# Patient Record
Sex: Female | Born: 1950 | Race: White | Hispanic: No | Smoking: Former smoker
Health system: Southern US, Community
[De-identification: ages and names within clinical notes are randomized; demographics above are authoritative.]

## PROBLEM LIST (undated history)

## (undated) DIAGNOSIS — F329 Major depressive disorder, single episode, unspecified: Secondary | ICD-10-CM

## (undated) DIAGNOSIS — E119 Type 2 diabetes mellitus without complications: Secondary | ICD-10-CM

## (undated) DIAGNOSIS — G20C Parkinsonism, unspecified: Secondary | ICD-10-CM

## (undated) DIAGNOSIS — N200 Calculus of kidney: Secondary | ICD-10-CM

## (undated) DIAGNOSIS — Z9981 Dependence on supplemental oxygen: Secondary | ICD-10-CM

## (undated) DIAGNOSIS — J189 Pneumonia, unspecified organism: Secondary | ICD-10-CM

## (undated) DIAGNOSIS — I951 Orthostatic hypotension: Secondary | ICD-10-CM

## (undated) DIAGNOSIS — I10 Essential (primary) hypertension: Secondary | ICD-10-CM

## (undated) DIAGNOSIS — F419 Anxiety disorder, unspecified: Secondary | ICD-10-CM

## (undated) DIAGNOSIS — F32A Depression, unspecified: Secondary | ICD-10-CM

## (undated) DIAGNOSIS — R7303 Prediabetes: Secondary | ICD-10-CM

## (undated) DIAGNOSIS — R519 Headache, unspecified: Secondary | ICD-10-CM

## (undated) DIAGNOSIS — J449 Chronic obstructive pulmonary disease, unspecified: Secondary | ICD-10-CM

## (undated) DIAGNOSIS — E876 Hypokalemia: Secondary | ICD-10-CM

## (undated) DIAGNOSIS — R079 Chest pain, unspecified: Secondary | ICD-10-CM

## (undated) DIAGNOSIS — Z9889 Other specified postprocedural states: Secondary | ICD-10-CM

## (undated) DIAGNOSIS — E162 Hypoglycemia, unspecified: Secondary | ICD-10-CM

## (undated) DIAGNOSIS — I509 Heart failure, unspecified: Secondary | ICD-10-CM

## (undated) HISTORY — DX: Depression, unspecified: F32.A

## (undated) HISTORY — DX: Other specified postprocedural states: Z98.890

## (undated) HISTORY — DX: Anxiety disorder, unspecified: F41.9

## (undated) HISTORY — DX: Hypokalemia: E87.6

## (undated) HISTORY — DX: Chest pain, unspecified: R07.9

## (undated) HISTORY — DX: Essential (primary) hypertension: I10

## (undated) HISTORY — PX: CHOLECYSTECTOMY: SHX55

## (undated) HISTORY — DX: Major depressive disorder, single episode, unspecified: F32.9

## (undated) HISTORY — PX: KIDNEY STONE SURGERY: SHX686

## (undated) HISTORY — DX: Hypoglycemia, unspecified: E16.2

## (undated) HISTORY — DX: Calculus of kidney: N20.0

---

## 2001-07-12 ENCOUNTER — Ambulatory Visit (HOSPITAL_COMMUNITY): Admission: RE | Admit: 2001-07-12 | Discharge: 2001-07-12 | Payer: Self-pay | Admitting: Urology

## 2001-07-17 ENCOUNTER — Ambulatory Visit (HOSPITAL_COMMUNITY): Admission: RE | Admit: 2001-07-17 | Discharge: 2001-07-17 | Payer: Self-pay | Admitting: Urology

## 2001-07-17 ENCOUNTER — Encounter: Payer: Self-pay | Admitting: Urology

## 2001-08-17 ENCOUNTER — Ambulatory Visit (HOSPITAL_COMMUNITY): Admission: RE | Admit: 2001-08-17 | Discharge: 2001-08-17 | Payer: Self-pay | Admitting: Urology

## 2001-08-17 ENCOUNTER — Encounter: Payer: Self-pay | Admitting: Urology

## 2002-02-05 ENCOUNTER — Ambulatory Visit (HOSPITAL_COMMUNITY): Admission: RE | Admit: 2002-02-05 | Discharge: 2002-02-05 | Payer: Self-pay | Admitting: Urology

## 2002-02-05 ENCOUNTER — Encounter: Payer: Self-pay | Admitting: Urology

## 2003-08-21 ENCOUNTER — Ambulatory Visit (HOSPITAL_COMMUNITY): Admission: RE | Admit: 2003-08-21 | Discharge: 2003-08-21 | Payer: Self-pay | Admitting: Internal Medicine

## 2004-05-07 ENCOUNTER — Ambulatory Visit: Payer: Self-pay | Admitting: Family Medicine

## 2004-09-11 ENCOUNTER — Ambulatory Visit: Payer: Self-pay | Admitting: Family Medicine

## 2004-09-16 ENCOUNTER — Ambulatory Visit (HOSPITAL_COMMUNITY): Admission: RE | Admit: 2004-09-16 | Discharge: 2004-09-16 | Payer: Self-pay | Admitting: Family Medicine

## 2004-10-15 ENCOUNTER — Ambulatory Visit: Payer: Self-pay | Admitting: Internal Medicine

## 2004-10-15 ENCOUNTER — Ambulatory Visit (HOSPITAL_COMMUNITY): Admission: RE | Admit: 2004-10-15 | Discharge: 2004-10-15 | Payer: Self-pay | Admitting: Internal Medicine

## 2004-10-15 DIAGNOSIS — Z9889 Other specified postprocedural states: Secondary | ICD-10-CM

## 2004-10-15 HISTORY — DX: Other specified postprocedural states: Z98.890

## 2004-10-15 HISTORY — PX: COLONOSCOPY: SHX174

## 2004-10-16 ENCOUNTER — Ambulatory Visit: Payer: Self-pay | Admitting: Family Medicine

## 2004-10-30 ENCOUNTER — Ambulatory Visit: Payer: Self-pay | Admitting: Cardiology

## 2004-11-05 ENCOUNTER — Ambulatory Visit: Payer: Self-pay

## 2005-11-03 ENCOUNTER — Ambulatory Visit (HOSPITAL_COMMUNITY): Admission: RE | Admit: 2005-11-03 | Discharge: 2005-11-03 | Payer: Self-pay | Admitting: Family Medicine

## 2005-12-06 ENCOUNTER — Emergency Department (HOSPITAL_COMMUNITY): Admission: EM | Admit: 2005-12-06 | Discharge: 2005-12-06 | Payer: Self-pay | Admitting: Emergency Medicine

## 2006-11-09 ENCOUNTER — Ambulatory Visit (HOSPITAL_COMMUNITY): Admission: RE | Admit: 2006-11-09 | Discharge: 2006-11-09 | Payer: Self-pay | Admitting: Family Medicine

## 2008-03-13 ENCOUNTER — Ambulatory Visit (HOSPITAL_COMMUNITY): Admission: RE | Admit: 2008-03-13 | Discharge: 2008-03-13 | Payer: Self-pay | Admitting: Family Medicine

## 2008-10-18 ENCOUNTER — Emergency Department (HOSPITAL_COMMUNITY): Admission: EM | Admit: 2008-10-18 | Discharge: 2008-10-18 | Payer: Self-pay | Admitting: Emergency Medicine

## 2009-04-21 ENCOUNTER — Ambulatory Visit (HOSPITAL_COMMUNITY): Admission: RE | Admit: 2009-04-21 | Discharge: 2009-04-21 | Payer: Self-pay | Admitting: Internal Medicine

## 2009-11-25 ENCOUNTER — Ambulatory Visit (HOSPITAL_COMMUNITY): Admission: RE | Admit: 2009-11-25 | Discharge: 2009-11-25 | Payer: Self-pay | Admitting: Gastroenterology

## 2010-01-06 ENCOUNTER — Emergency Department (HOSPITAL_COMMUNITY): Admission: EM | Admit: 2010-01-06 | Discharge: 2010-01-06 | Payer: Self-pay | Admitting: Emergency Medicine

## 2010-04-14 ENCOUNTER — Emergency Department (HOSPITAL_COMMUNITY)
Admission: EM | Admit: 2010-04-14 | Discharge: 2010-04-14 | Payer: Self-pay | Source: Home / Self Care | Admitting: Emergency Medicine

## 2010-07-06 LAB — URINALYSIS, ROUTINE W REFLEX MICROSCOPIC
Bilirubin Urine: NEGATIVE
Glucose, UA: NEGATIVE mg/dL
Hgb urine dipstick: NEGATIVE
Ketones, ur: NEGATIVE mg/dL
Leukocytes, UA: NEGATIVE
Nitrite: NEGATIVE
Specific Gravity, Urine: 1.02 (ref 1.005–1.030)
Urobilinogen, UA: 0.2 mg/dL (ref 0.0–1.0)
pH: 6 (ref 5.0–8.0)

## 2010-07-06 LAB — COMPREHENSIVE METABOLIC PANEL
ALT: 22 U/L (ref 0–35)
AST: 27 U/L (ref 0–37)
Albumin: 3.7 g/dL (ref 3.5–5.2)
Alkaline Phosphatase: 111 U/L (ref 39–117)
BUN: 7 mg/dL (ref 6–23)
CO2: 29 mEq/L (ref 19–32)
Calcium: 9 mg/dL (ref 8.4–10.5)
Chloride: 100 mEq/L (ref 96–112)
Creatinine, Ser: 0.85 mg/dL (ref 0.4–1.2)
GFR calc Af Amer: >60 mL/min (ref >60)
GFR calc non Af Amer: >60 mL/min (ref >60)
Glucose, Bld: 117 mg/dL — ABNORMAL HIGH (ref 70–99)
Potassium: 3.4 mEq/L — ABNORMAL LOW (ref 3.5–5.1)
Sodium: 139 mEq/L (ref 135–145)
Total Bilirubin: 0.7 mg/dL (ref 0.3–1.2)
Total Protein: 7.2 g/dL (ref 6.0–8.3)

## 2010-07-06 LAB — URINE MICROSCOPIC-ADD ON

## 2010-07-06 LAB — LIPASE, BLOOD: Lipase: 24 U/L (ref 11–59)

## 2010-09-08 ENCOUNTER — Encounter: Payer: Self-pay | Admitting: Urgent Care

## 2010-09-08 ENCOUNTER — Ambulatory Visit (INDEPENDENT_AMBULATORY_CARE_PROVIDER_SITE_OTHER): Payer: Self-pay | Admitting: Urgent Care

## 2010-09-08 DIAGNOSIS — K219 Gastro-esophageal reflux disease without esophagitis: Secondary | ICD-10-CM | POA: Insufficient documentation

## 2010-09-08 DIAGNOSIS — R1310 Dysphagia, unspecified: Secondary | ICD-10-CM

## 2010-09-08 NOTE — Assessment & Plan Note (Signed)
[name] is a 60 y.o. female w/ hx Parkinson's disease who has several month hx of dysphagia sensation of slow transit in the esophagus.  Much of this may be secondary to Parkinson's, however we do need to r/o esophageal web, ring, stricture.  She also has GERD symptoms not on PPI.  She will need EGD for further evaluation with possible esophageal dilation by Dr. Darrick Penna.  I have discussed risks & benefits which include, but are not limited to, bleeding, infection, perforation & drug reaction.  The patient agrees with this plan & written consent will be obtained.    Dysphagia Level 2 diet until procedure

## 2010-09-08 NOTE — Assessment & Plan Note (Signed)
Worsening symptoms, especially nocturnal.  May need daily PPI.  Will proceed w/ EGD first.  Nocturnal symptoms could also be associated w/ sleep apnea.  She will FU w/ her PCP regarding this.  Consider daily PPI post procedure.

## 2010-09-08 NOTE — Patient Instructions (Signed)
Dysphagia Diet Level 2 (Mechanically Altered) This Dysphagia Mechanically Altered Diet is restricted to:  Foods that are moist, soft-textured, and easy to chew and swallow.   Meats that are ground or are minced no larger than one-quarter inch pieces. Meats are moist with gravy or sauce added.   Foods that do not include bread or bread-like textures except soft pancakes, well-moistened with syrup or sauce.   Textures with some chewing ability required.   Casseroles without rice.   Cooked vegetables that are less than half an inch in size and easily mashed with a fork. No cooked corn, peas, broccoli, cauliflower, cabbage, Brussels sprouts, asparagus, or other fibrous, non-tender or rubbery cooked vegetables.   Canned fruit except for pineapple. Fruit must be cut into pieces no larger than half an inch in size.   Foods that do not include nuts, seeds, coconut, or sticky textures.  FOOD TEXTURES FOR DYSPHAGIA DIET LEVEL 2 - MECHANICALLY ALTERED (includes all foods on Dysphagia Diet Level 1 - Pureed, in addition to the foods listed below) FOOD GROUP: Beverages. RECOMMENDED: All beverages thickened to recommended consistency with minimal amounts of texture pulp, etc. (Any texture should be suspended in the liquid and should not fall out.) AVOID: All others.  You are currently limited to one of the following liquid consistency levels:   Thin.   Nectar-like.   Honey-like.   Spoon-thick.  FOOD GROUP: Breads. RECOMMENDED: Soft pancakes, well-moistened with syrup or sauce. AVOID: All others. FOOD GROUP: Cereals. RECOMMENDED: Cooked cereals with little texture, including oatmeal. Unprocessed wheat bran stirred into cereals for bulk. Note: If thin liquids are restricted, it is important that all of the liquid is absorbed into the cereal. AVOID: All dry cereals and any cooked cereals that may contain flax seeds or other seeds or nuts. Whole-grain, dry, or coarse cereals. Cereals with nuts,  seeds, dried fruit, and/or coconut. FOOD GROUP: Desserts. RECOMMENDED: Pudding, custard. Soft fruit pies with bottom crust only. Canned fruit (excluding pineapple). Soft, moist cakes with icing. AVOID: Dry, coarse cakes and cookies. Anything with nuts, seeds, coconut, pineapple, or dried fruit. Breakfast yogurt with nuts. Rice or bread pudding. These foods are considered thin liquids and should be avoided if thin liquids are restricted: Frozen malts, milk shakes, frozen yogurt, eggnog, nutritional supplements, ice cream, sherbet, regular or sugar-free gelatin, or any foods that become thin liquid at either room (70 F) or body temperature (98 F). FOOD GROUP: Fats. RECOMMENDED: Butter, margarine, cream for cereal (depending on liquid consistency recommendations), gravy, cream sauces, sour cream, sour cream dips with soft additives, mayonnaise, salad dressings, cream cheese, cream cheese spreads with soft additives, whipped toppings. AVOID: All fats with coarse or chunky additives. FOOD GROUP: Fruits. RECOMMENDED: Soft drained, canned, or cooked fruits without seeds or skin. Fresh soft and ripe banana. Fruit juices with a small amount of pulp. If thin liquids are restricted, fruit juices should be thickened to appropriate consistency. AVOID: Fresh or frozen fruits. Cooked fruit with skin or seeds. Dried fruits. Fresh, canned, or cooked pineapple. FOOD GROUP: Meats and Meat Substitutes. (Meat pieces should not exceed 1/4 of an inch cube and should be tender.) RECOMMENDED: Moistened ground or cooked meat, poultry, or fish. Moist ground or tender meat may be served with gravy or sauce. Casseroles without rice. Moist macaroni and cheese, well-cooked pasta with meat sauce, tuna noodle casserole, soft, moist lasagna. Moist meatballs, meatloaf, or fish loaf. Protein salads, such as tuna or egg without large chunks, celery, or onion. Cottage cheese,  smooth quiche without large chunks. Poached, scrambled, or  soft-cooked eggs (egg yolks should not be "runny" but should be moist and able to be mashed with butter, margarine, or other moisture added to them). (Cook eggs to 160 F or use pasteurized eggs for safety.) Souffls may have small, soft chunks. Tofu. Well-cooked, slightly mashed, moist legumes, such as baked beans. All meats or protein substitutes should be served with sauces or moistened to help maintain cohesiveness in the oral cavity. AVOID: Dry meats, tough meats (such as bacon, sausage, hot dogs, bratwurst). Dry casseroles or casseroles with rice or large chunks. Peanut butter. Cheese slices and cubes. Hard-cooked or crisp fried eggs. Sandwiches. Pizza. FOOD GROUP: Potatoes and Starches. RECOMMENDED: Well-cooked, moistened, boiled, baked, or mashed potatoes. Well-cooked shredded hash brown potatoes that are not crisp. (All potatoes need to be moist and in sauces.)Well-cooked noodles in sauce. Spaetzel or soft dumplings that have been moistened with butter or gravy. AVOID: Potato skins and chips. Fried or French-fried potatoes. Rice. FOOD GROUP: Soups. RECOMMENDED: Soups with easy-to-chew or easy-to-swallow meats or vegetables: Particle sizes in soups should be less than 1/2 inch. Soups will need to be thickened to appropriate consistency if soup is thinner than prescribed liquid consistency. AVOID: Soups with large chunks of meat and vegetables. Soups with rice, corn, peas. FOOD GROUP: Vegetables. RECOMMENDED: All soft, well-cooked vegetables. Vegetables should be less than a half inch. Should be easily mashed with a fork. AVOID: Cooked corn and peas. Broccoli, cabbage, Brussels sprouts, asparagus, or other fibrous, non-tender or rubbery cooked vegetables. FOOD GROUP: Miscellaneous. RECOMMENDED: Jams and preserves without seeds, jelly. Sauces, salsas, etc., that may have small tender chunks less than 1/2 inch. Soft, smooth chocolate bars that are easily chewed. AVOID: Seeds, nuts, coconut, or  sticky foods. Chewy candies such as caramels or licorice. *Ask for a separate educational handout on liquid consistencies, if necessary. Document Released: 04/12/2005 Document Re-Released: 09/30/2009 [facility] Patient Information 2011 [place], [place].

## 2010-09-08 NOTE — Progress Notes (Signed)
Referring Provider:[facility] Primary Care Physician:  [facility] Primary Gastroenterologist:  Dr. Darrick Penna  Chief Complaint  Patient presents with  . Dysphagia    HPI:  Allison Rollins is a 60 y.o. female here as a referral from [facility] for dysphagia.  Pt w/ hx Parkinson's disease.  Hx dysphagia x several months.  Husband states she frequently wakes up in the middle of night choking & clearing throat.  C/o dry throat.  Chews food really well.  Takes 1 hr to eat because she's afraid of choking.  Problems w/ KCL pill getting hung in throat.  Sometimes gets strangled w/ liquids. Feels like all food is slow going down.  C/o heartburn & indigestion.  Not taking any meds for GERD.  Denies any abd pain.  Hx constipation, but doing well now.  Denies rectal bleeding or melena.  BM q2-3 days.  Appetite ok.  Weight stable.  Denies fever or chills.  Past Medical History  Diagnosis Date  . S/P colonoscopy 10/15/04    normal  . Kidney stones   . HTN (hypertension)   . Hypoglycemia   . Parkinson's disease   . Anxiety   . Depression     Past Surgical History  Procedure Date  . Cholecystectomy   . Kidney stone surgery     Current Outpatient Prescriptions  Medication Sig Dispense Refill  . diltiazem (CARDIZEM) 120 MG tablet Take 120 mg by mouth 4 (four) times daily.        . furosemide (LASIX) 40 MG tablet Take 40 mg by mouth 2 (two) times daily.        . potassium chloride SA (K-DUR,KLOR-CON) 20 MEQ tablet Take 20 mEq by mouth 2 (two) times daily.        . primidone (MYSOLINE) 250 MG tablet Take 250 mg by mouth 4 (four) times daily.        . rosuvastatin (CRESTOR) 10 MG tablet Take 10 mg by mouth daily.          Allergies as of 09/08/2010 - Review Complete 09/08/2010  Allergen Reaction Noted  . Penicillins  09/08/2010    Family History: There is no known family history of colorectal carcinoma , liver disease, or inflammatory bowel disease.    Problem Relation Age of Onset  . COPD Mother   . Coronary artery disease Father   . Coronary artery disease Brother   . Coronary artery disease Sister    History   Social History  . Marital Status: Married    Spouse Name: N/A    Number of Children: 2  . Years of Education: N/A   Occupational History  . disabled    Social History Main Topics  . Smoking status: Never Smoker   . Smokeless tobacco: Not on file  . Alcohol Use: No  . Drug Use: No  . Sexually Active: Not on file   Review of Systems: Gen: Denies any fever, chills, sweats, anorexia, fatigue, weakness, malaise, weight loss. CV: Denies chest pain, angina, palpitations, syncope, orthopnea, PND, peripheral edema, and claudication. Resp: Denies dyspnea at rest, dyspnea with exercise,wheezing, coughing up blood, and pleurisy. GI: Denies vomiting blood, jaundice, and fecal incontinence.   GU : Denies urinary burning, blood in urine, urinary frequency, urinary hesitancy, nocturnal urination, and urinary incontinence. MS: Occ joint pain, limitation of movement, and swelling, stiffness, low back pain, extremity pain. Denies muscle weakness, cramps, atrophy.  Derm: c/o rash to abd, low-mid -back, using  cream from PCP, seems to help Psych: Denies depression, anxiety, memory loss, suicidal ideation, hallucinations, paranoia, and confusion. Heme: Denies bruising, bleeding, and enlarged lymph nodes.  Physical Exam: BP 152/88  Pulse 50  Temp(Src) 97.5 F (36.4 C) (Temporal)  Ht 5\' 6"  (1.676 m)  Wt 242 lb 6.4 oz (109.952 kg)  BMI 39.12 kg/m2 General:   Alert,  Well-developed,Obese, pleasant and cooperative in NAD Head:  Normocephalic and atraumatic. Eyes:  Sclera clear, no icterus.   Conjunctiva pink. Ears:  Normal auditory acuity. Nose:  No deformity, discharge,  or lesions. Mouth:  No deformity or lesions,Upper dentures intact. Neck:  Supple; no masses or thyromegaly. Lungs:  Clear throughout to auscultation.   No  wheezes, crackles, or rhonchi. No acute distress. Heart:  Regular rate and rhythm; no murmurs, clicks, rubs,  or gallops. Abdomen:  Obese, Soft, nontender and nondistended. No masses, hepatosplenomegaly or hernias noted. Normal bowel sounds, without guarding, and without rebound.  Exam limited given pt body habitus. Msk:  Symmetrical without gross deformities. Normal posture. Pulses:  Normal pulses noted. Extremities:  Without clubbing.  Trace BLEE. Pill-rolling tremors to bilat upper extremities. Neurologic:  Alert and  oriented x4;  grossly normal neurologically. Skin:  Intact without significant lesions or rashes. Cervical Nodes:  No significant cervical adenopathy. Psych:  Alert and cooperative. Normal mood and affect.

## 2010-09-08 NOTE — Progress Notes (Signed)
[facility] 

## 2010-09-11 ENCOUNTER — Other Ambulatory Visit: Payer: Self-pay | Admitting: Gastroenterology

## 2010-09-11 ENCOUNTER — Encounter: Payer: Self-pay | Admitting: Gastroenterology

## 2010-09-11 ENCOUNTER — Ambulatory Visit (HOSPITAL_COMMUNITY)
Admission: RE | Admit: 2010-09-11 | Discharge: 2010-09-11 | Disposition: A | Discharge status: Home / Self Care | Payer: Self-pay | Source: Ambulatory Visit | Attending: Gastroenterology | Admitting: Gastroenterology

## 2010-09-11 DIAGNOSIS — E785 Hyperlipidemia, unspecified: Secondary | ICD-10-CM | POA: Insufficient documentation

## 2010-09-11 DIAGNOSIS — I10 Essential (primary) hypertension: Secondary | ICD-10-CM | POA: Insufficient documentation

## 2010-09-11 DIAGNOSIS — G20 Parkinson's disease: Secondary | ICD-10-CM | POA: Insufficient documentation

## 2010-09-11 DIAGNOSIS — K5710 Diverticulosis of small intestine without perforation or abscess without bleeding: Secondary | ICD-10-CM

## 2010-09-11 DIAGNOSIS — G20A1 Parkinson's disease without dyskinesia, without mention of fluctuations: Secondary | ICD-10-CM | POA: Insufficient documentation

## 2010-09-11 DIAGNOSIS — K2940 Chronic atrophic gastritis without bleeding: Secondary | ICD-10-CM | POA: Insufficient documentation

## 2010-09-11 DIAGNOSIS — R1310 Dysphagia, unspecified: Secondary | ICD-10-CM

## 2010-09-11 HISTORY — PX: ESOPHAGOGASTRODUODENOSCOPY: SHX1529

## 2010-09-11 LAB — KOH PREP: KOH Prep: NONE SEEN

## 2010-09-11 NOTE — H&P (Signed)
[facility]  Patient:    Allison Rollins, SARNO Visit Number: [id number] MRN: [id number]          Service Type: OUT Location: RAD Attending Physician:  Dennie Maizes Dictated by:   Dennie Maizes, M.D. Admit Date:  07/17/2001 Discharge Date: 07/17/2001   CC:         Dr. Lysbeth Galas, [employer], [place]  Annette Stable A. Hasanaj, M.D., [place], [place]   History and Physical  PREOPERATIVE HISTORY AND PHYSICAL  CHIEF COMPLAINT:  Right upper ureteral calculus with obstruction, status post right ureteral stent placement.  HISTORY OF PRESENT ILLNESS:  This 60 year old female was admitted to [facility] with severe right flank pain, right lower quadrant abdominal pain and fever up to 103 degrees Fahrenheit, nausea and vomiting.  She denied having any voiding difficulty, dysuria or hematuria at that time.  There is no past history of ureterolithiasis.  Urinalysis was suggestive of a urinary tract infection.  Non-contrasted spiral CT scan of the abdomen and pelvis was done.  This revealed a large 15 x 6-mm size right upper ureter calculus with obstruction and hydronephrosis.  The patient was admitted to the hospital and treated with IV antibiotics.  She was taken to the OR on June 09, 2001, and underwent cystoscopy, right retrograde pyelogram and right ureteral stent placement.  She is doing well at present.  There is no history of fever, chills, hematuria or voiding difficulty.  She is brought to the day hospital today for lithotripsy of the right upper ureteral calculus.  PAST MEDICAL HISTORY: 1. History of hypertension. 2. Depression. 3. Status post right ureteral stent placement.  MEDICATIONS:  Altace and Zoloft.  ALLERGIES:  PENICILLIN.  PHYSICAL EXAMINATION:  HEAD, EYES, EARS, NOSE, AND THROAT:  Normal.  NECK:  No masses.  LUNGS:  Clear to auscultation.  HEART:  Regular rate and rhythm, no murmurs.  ABDOMEN:  Soft, no palpable flank mass, no  costovertebral angle tenderness, bladder not palpable.  IMPRESSION: 1. Right renal colic. 2. Right upper ureter calculus with obstruction. 3. Urinary tract infection (resolved).  PLAN:  ESWL of the large right upper ureter calculus with IV sedation in day hospital.  I have discussed with the patient regarding the diagnosis, operative details, alternative treatments, outcome, possible risks and complications and she has agreed for the procedure to be done. Dictated by:   Dennie Maizes, M.D. Attending Physician:  Dennie Maizes DD:  07/11/01 TD:  07/12/01 Job: [id number]

## 2010-09-11 NOTE — Op Note (Signed)
[name], Allison Rollins NO.:  [id number]   MEDICAL RECORD NO.:  [id number]          PATIENT TYPE:  AMB   LOCATION:  DAY                           FACILITY:  [facility]   PHYSICIAN:  R. Roetta Sessions, M.D. DATE OF BIRTH:  [birth year]   DATE OF PROCEDURE:  10/15/2004  DATE OF DISCHARGE:                                 OPERATIVE REPORT   PROCEDURE PERFORMED:  Screening colonoscopy.   INDICATIONS FOR PROCEDURE:  The patient is a 61 year old lady devoid of any  GI tract symptoms.  Sent over courtesy of Maurice March, M.D. in  [place] for colorectal cancer screening.  There is no family history of  colorectal neoplasia.  She has never had a colonoscopy.  Colonoscopy is now  being done as a screening maneuver.  This approach has been discussed with  the patient at length.  Potential risks, benefits and alternatives and  limitations.   PROCEDURE NOTE:  Oxygen saturations, blood pressure, pulse and respirations  were monitored throughout the entire procedure.  Conscious sedation Versed 4  mg IV, Demerol 100 mg IV in divided doses.   INSTRUMENT USED:  Olympus video chip system.   FINDINGS:  Digital exam revealed no abnormalities.   ENDOSCOPIC FINDINGS:  Prep was adequate.   Rectum:  Examination of the rectal mucosa including retroflex view of the  anal verge revealed no abnormalities.  Colon:  Colonic mucosa was surveyed from the rectosigmoid junction to the  left, transverse and right colon to the area of the appendiceal orifice and  ileocecal valve and cecum.  These structures were well seen and photographed  for the record.  From this level, the scope was slowly withdrawn.  All  previously mentioned mucosal surfaces were again seen.  The colonic mucosa  appeared normal.  The patient tolerated the procedure well, was reacted in  endoscopy.   IMPRESSION:  1.  Normal rectum.  2.  Normal colon.   RECOMMENDATIONS:  Repeat colonoscopy in 10 years.       RMR/MEDQ  D:  10/15/2004  T:  10/15/2004  Job:  [id number]   cc:   Maurice March, M.D.  [address] [place]  [place] [address]  Fax: [phone]

## 2010-09-23 ENCOUNTER — Encounter: Payer: Self-pay | Admitting: Gastroenterology

## 2010-10-13 NOTE — Op Note (Signed)
Allison Rollins, RAUSCH             ACCOUNT NO.:  [id number]  MEDICAL RECORD NO.:  [id number]           PATIENT TYPE:  O  LOCATION:  DAYP                          FACILITY:  [facility]  PHYSICIAN:  Jonette Eva, M.D.     DATE OF BIRTH:  03-02-1951  DATE OF PROCEDURE:  09/11/2010 DATE OF DISCHARGE:                              OPERATIVE REPORT   REFERRING PHYSICIAN:  [facility].  PROCEDURE:  Esophagogastroduodenoscopy with Savary dilation and cold forceps biopsy of the gastric mucosa.  INDICATION FOR EXAM:  Ms. Shrider is a 60 year old female who presents with a significant past medical history of Parkinson disease.  She reports choking at night and frequent clearing of her throat.  She gets strangled on liquids and all fluids go down slowly.  When she presented to the office has not taken any medication for gastroesophageal reflux disease.  She was given a handout on a soft mechanical diet.  FINDINGS: 1. Unable to appreciate definite stricture or ring in the esophagus.     The esophagus was empirically dilated with a 16-mm Savary dilator.     No evidence of Barrett mass, erosion or ulceration. 2. Multiple white patch seen in the esophagus.  Brush biopsies     obtained to evaluate for Candida esophagitis. 3. Patchy erythema in the mid body and antrum without erosion or     ulceration.  Biopsies obtained via cold forceps to evaluate for H.     pylori gastritis. 4. Normal duodenal bulb. 5. Single diverticulum seen in the second portion of the duodenum.     Otherwise normal second portion of the duodenum with moderate bile     staining, the diverticula inferior to the wall of ampulla.  DIAGNOSIS:  Dysphagia likely multifactorial (possible Candida esophagitis, likely nonspecific esophageal motility disorder, and/or uncontrolled gastroesophageal reflux disease), status post empiric dilation.  RECOMMENDATIONS: 1. Prilosec 20 mg 30 minutes prior to first meal or may  substitute     Dexilant daily. 2. We will await biopsies and KOH prep. 3. Add Benefiber 2 teaspoons twice daily.  Avoid high-fiber diet at     this point because of esophageal motility disorder has not been     ruled out. 4. She should follow a soft mechanical diet.  She was given a handout     on soft mechanical diet and gastritis. 5. Followup appointment in 3 months regarding her dysphagia.  If her     dysphagia percent, she will need a barium pill esophagram.  MEDICATIONS: 1. Demerol 75 mg IV. 2. Versed 5 mg IV.  PROCEDURE TECHNIQUE:  Physical exam was performed.  Informed consent was obtained from the patient explaining benefits, risks, and alternatives to the procedure.  The patient was connected to the monitor and placed in left lateral position.  Continuous oxygen was provided by nasal cannula and IV medicine administered through an indwelling cannula. After administration of sedation, the patient's esophagus was intubated and the scope was advanced under direct visualization to second portion of the duodenum.  The scope was removed slowly by carefully examining the color, texture, anatomy, and integrity of the mucosa  on the way out. The patient was recovered in endoscopy and discharged home in satisfactory condition.  PATH: KOH NEG; CHRONIC GASTRITIS-continue daily PPI.   Jonette Eva, M.D.     SF/MEDQ  D:  09/11/2010  T:  09/12/2010  Job:  [id number]  cc:   [facility]  Electronically Signed by Jonette Eva M.D. on 10/13/2010 01:32:43 PM

## 2010-10-27 ENCOUNTER — Other Ambulatory Visit: Payer: Self-pay | Admitting: Obstetrics and Gynecology

## 2010-10-27 DIAGNOSIS — Z139 Encounter for screening, unspecified: Secondary | ICD-10-CM

## 2010-11-03 ENCOUNTER — Ambulatory Visit (HOSPITAL_COMMUNITY): Payer: Self-pay

## 2010-11-17 ENCOUNTER — Ambulatory Visit (HOSPITAL_COMMUNITY)
Admission: RE | Admit: 2010-11-17 | Discharge: 2010-11-17 | Disposition: A | Discharge status: Home / Self Care | Payer: Self-pay | Source: Ambulatory Visit | Attending: Obstetrics and Gynecology | Admitting: Obstetrics and Gynecology

## 2010-11-17 DIAGNOSIS — Z139 Encounter for screening, unspecified: Secondary | ICD-10-CM

## 2010-11-25 ENCOUNTER — Ambulatory Visit: Payer: Self-pay | Admitting: Gastroenterology

## 2010-12-03 ENCOUNTER — Ambulatory Visit: Payer: Self-pay | Admitting: Gastroenterology

## 2011-01-13 ENCOUNTER — Ambulatory Visit (INDEPENDENT_AMBULATORY_CARE_PROVIDER_SITE_OTHER): Payer: Self-pay | Admitting: Gastroenterology

## 2011-01-13 ENCOUNTER — Encounter: Payer: Self-pay | Admitting: Gastroenterology

## 2011-01-13 DIAGNOSIS — R1310 Dysphagia, unspecified: Secondary | ICD-10-CM

## 2011-01-13 DIAGNOSIS — K219 Gastro-esophageal reflux disease without esophagitis: Secondary | ICD-10-CM

## 2011-01-13 NOTE — Assessment & Plan Note (Addendum)
FAIRLY WELL CONTROLLED WITH OMP 20 MG DAILY.  OPV IN 6 MOS. HAVE PT FILL OUT PAPERWORK FOR MED ASSISTANCE FOR A PPI.

## 2011-01-13 NOTE — Progress Notes (Signed)
  Subjective:    Patient ID: Allison Rollins, female    DOB: 04-21-1951, 60 y.o.   MRN: [id number]  [facility]  HPI SWALLOWING BETTER. Reflux controlled with OMP 1 30 minutes prior to her meal. If takes a warm shower, gags and spits up phlegm. STAYS RUN DOWN. HAS SWELLING IN LEGS/FEET. Has depression and gained 6 lbs since May 2012. 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 [id number] EARLY STAGE. BLOATED.  Past Medical History  Diagnosis Date  . S/P colonoscopy 10/15/04    normal  . Kidney stones   . HTN (hypertension)   . Hypoglycemia   . Parkinson's disease   . Anxiety   . Depression     Past Surgical History  Procedure Date  . Cholecystectomy   . Kidney stone surgery     Allergies  Allergen Reactions  . Penicillins     Current Outpatient Prescriptions  Medication Sig Dispense Refill  . diltiazem (CARDIZEM) 120 MG tablet Take 120 mg by mouth 4 (four) times daily.        . furosemide (LASIX) 40 MG tablet Take 40 mg by mouth 2 (two) times daily.        Marland Kitchen omeprazole (PRILOSEC) 20 MG capsule Take 20 mg by mouth daily.        . potassium chloride SA (K-DUR,KLOR-CON) 20 MEQ tablet Take 20 mEq by mouth 2 (two) times daily.        . primidone (MYSOLINE) 250 MG tablet Take 250 mg by mouth 4 (four) times daily.        . rosuvastatin (CRESTOR) 10 MG tablet Take 10 mg by mouth daily.           Review of Systems     Objective:   Physical Exam  Vitals reviewed. Constitutional: She is oriented to person, place, and time. She appears well-nourished. No distress.  HENT:  Head: Normocephalic and atraumatic.  Mouth/Throat: Oropharynx is clear and moist.  Eyes: Pupils are equal, round, and reactive to light.  Neck: Normal range of motion.  Cardiovascular: Normal rate and normal heart sounds.   Pulmonary/Chest: Effort normal and breath sounds normal.  Abdominal: Soft. Bowel sounds are normal. She exhibits no distension. There is no tenderness.       obese    Neurological: She is alert and oriented to person, place, and time.       No focal deficits          Assessment & Plan:

## 2011-01-13 NOTE — Assessment & Plan Note (Addendum)
RESOLVED.  Reassess in the future: EGD/DIL V. BPE.

## 2011-01-14 NOTE — Progress Notes (Signed)
Reminder in epic to follow up with SF in 6 months °

## 2011-01-14 NOTE — Progress Notes (Signed)
[facility] 

## 2011-03-26 ENCOUNTER — Other Ambulatory Visit (HOSPITAL_COMMUNITY): Payer: Self-pay

## 2011-03-30 ENCOUNTER — Ambulatory Visit (HOSPITAL_COMMUNITY)
Admission: RE | Admit: 2011-03-30 | Discharge: 2011-03-30 | Disposition: A | Discharge status: Home / Self Care | Payer: Self-pay | Source: Ambulatory Visit | Attending: Physician Assistant | Admitting: Physician Assistant

## 2011-03-30 DIAGNOSIS — R0609 Other forms of dyspnea: Secondary | ICD-10-CM | POA: Insufficient documentation

## 2011-03-30 DIAGNOSIS — I517 Cardiomegaly: Secondary | ICD-10-CM

## 2011-03-30 DIAGNOSIS — R0989 Other specified symptoms and signs involving the circulatory and respiratory systems: Secondary | ICD-10-CM | POA: Insufficient documentation

## 2011-03-30 NOTE — Progress Notes (Signed)
*  PRELIMINARY RESULTS* Echocardiogram 2D Echocardiogram has been performed.  Allison Rollins 03/30/2011, 8:44 AM

## 2011-06-25 ENCOUNTER — Emergency Department (HOSPITAL_COMMUNITY)
Admission: EM | Admit: 2011-06-25 | Discharge: 2011-06-25 | Disposition: A | Discharge status: Home / Self Care | Payer: Self-pay | Attending: Emergency Medicine | Admitting: Emergency Medicine

## 2011-06-25 ENCOUNTER — Emergency Department (HOSPITAL_COMMUNITY): Payer: Self-pay

## 2011-06-25 ENCOUNTER — Encounter (HOSPITAL_COMMUNITY): Payer: Self-pay | Admitting: *Deleted

## 2011-06-25 DIAGNOSIS — J449 Chronic obstructive pulmonary disease, unspecified: Secondary | ICD-10-CM | POA: Insufficient documentation

## 2011-06-25 DIAGNOSIS — Z79899 Other long term (current) drug therapy: Secondary | ICD-10-CM | POA: Insufficient documentation

## 2011-06-25 DIAGNOSIS — R079 Chest pain, unspecified: Secondary | ICD-10-CM | POA: Insufficient documentation

## 2011-06-25 DIAGNOSIS — J3489 Other specified disorders of nose and nasal sinuses: Secondary | ICD-10-CM | POA: Insufficient documentation

## 2011-06-25 DIAGNOSIS — J4489 Other specified chronic obstructive pulmonary disease: Secondary | ICD-10-CM | POA: Insufficient documentation

## 2011-06-25 DIAGNOSIS — I10 Essential (primary) hypertension: Secondary | ICD-10-CM | POA: Insufficient documentation

## 2011-06-25 DIAGNOSIS — J40 Bronchitis, not specified as acute or chronic: Secondary | ICD-10-CM | POA: Insufficient documentation

## 2011-06-25 DIAGNOSIS — F341 Dysthymic disorder: Secondary | ICD-10-CM | POA: Insufficient documentation

## 2011-06-25 DIAGNOSIS — R059 Cough, unspecified: Secondary | ICD-10-CM | POA: Insufficient documentation

## 2011-06-25 DIAGNOSIS — G20 Parkinson's disease: Secondary | ICD-10-CM | POA: Insufficient documentation

## 2011-06-25 DIAGNOSIS — R0602 Shortness of breath: Secondary | ICD-10-CM | POA: Insufficient documentation

## 2011-06-25 DIAGNOSIS — R062 Wheezing: Secondary | ICD-10-CM | POA: Insufficient documentation

## 2011-06-25 DIAGNOSIS — G20A1 Parkinson's disease without dyskinesia, without mention of fluctuations: Secondary | ICD-10-CM | POA: Insufficient documentation

## 2011-06-25 DIAGNOSIS — R070 Pain in throat: Secondary | ICD-10-CM | POA: Insufficient documentation

## 2011-06-25 DIAGNOSIS — R05 Cough: Secondary | ICD-10-CM | POA: Insufficient documentation

## 2011-06-25 DIAGNOSIS — R609 Edema, unspecified: Secondary | ICD-10-CM | POA: Insufficient documentation

## 2011-06-25 HISTORY — DX: Chronic obstructive pulmonary disease, unspecified: J44.9

## 2011-06-25 MED ORDER — ALBUTEROL SULFATE HFA 108 (90 BASE) MCG/ACT IN AERS
2.0000 | INHALATION_SPRAY | RESPIRATORY_TRACT | Status: DC | PRN
  Administered 2011-06-25: 2 via RESPIRATORY_TRACT
  Filled 2011-06-25: qty 6.7

## 2011-06-25 MED ORDER — ALBUTEROL SULFATE HFA 108 (90 BASE) MCG/ACT IN AERS: 1.0000 | INHALATION_SPRAY | Freq: Four times a day (QID) | RESPIRATORY_TRACT | Status: DC | PRN

## 2011-06-25 MED ORDER — PREDNISONE 50 MG PO TABS: ORAL_TABLET | ORAL | Status: DC

## 2011-06-25 NOTE — ED Provider Notes (Signed)
This chart was scribed for Allison Gaskins, MD by Wallis Mart. The patient was seen in room APA07/APA07 and the patient's care was started at 5:34 PM.   CSN: [id number]  Arrival date & time 06/25/11  1702   First MD Initiated Contact with Patient 06/25/11 1727      Chief Complaint  Patient presents with  . Cough     Patient is a 61 y.o. female presenting with cough. The history is provided by the patient.  Cough This is a new problem. The current episode started 2 days ago. The problem occurs constantly. The problem has been gradually worsening. The cough is productive of blood-tinged sputum. There has been no fever. Associated symptoms include rhinorrhea, sore throat, shortness of breath and wheezing. She has tried nothing for the symptoms. Her past medical history is significant for COPD.    Pt seen at 5:34 PM [place] is a 61 y.o. female who presents to the Emergency Department complaining of sudden onset, persistence of constant, gradually worsening moderate to severe productive of yellow sputum cough and wheezing onset 2 days ago.  Pt states that she coughed up a streak of blood.  Pt c/o associated sore throat, nasal congestion,SOB,  rib pain from coughing. Denies fever.  Pt w/ h/o wheezing.  There are no other associated symptoms and no other alleviating or aggravating factors.   Past Medical History  Diagnosis Date  . S/P colonoscopy 10/15/04    normal  . Kidney stones   . HTN (hypertension)   . Hypoglycemia   . Parkinson's disease   . Anxiety   . Depression   . COPD (chronic obstructive pulmonary disease)     Past Surgical History  Procedure Date  . Cholecystectomy   . Kidney stone surgery     Family History  Problem Relation Age of Onset  . COPD Mother   . Coronary artery disease Father   . Coronary artery disease Brother   . Coronary artery disease Sister     History  Substance Use Topics  . Smoking status: Never Smoker   . Smokeless  tobacco: Not on file  . Alcohol Use: No    OB History    Grav Para Term Preterm Abortions TAB SAB Ect Mult Living                  Review of Systems  HENT: Positive for sore throat and rhinorrhea.   Respiratory: Positive for cough, shortness of breath and wheezing.        Allergies  Penicillins  Home Medications   Current Outpatient Rx  Name Route Sig Dispense Refill  . DILTIAZEM HCL 120 MG PO TABS Oral Take 120 mg by mouth 4 (four) times daily.      . FUROSEMIDE 40 MG PO TABS Oral Take 40 mg by mouth 2 (two) times daily.      Marland Kitchen OMEPRAZOLE 20 MG PO CPDR Oral Take 20 mg by mouth daily.      Marland Kitchen POTASSIUM CHLORIDE CRYS ER 20 MEQ PO TBCR Oral Take 20 mEq by mouth 2 (two) times daily.      Marland Kitchen PRIMIDONE 250 MG PO TABS Oral Take 250 mg by mouth 4 (four) times daily.      Marland Kitchen ROSUVASTATIN CALCIUM 10 MG PO TABS Oral Take 10 mg by mouth daily.        BP 148/64  Pulse 62  Temp(Src) 97.8 F (36.6 C) (Oral)  Resp 20  Ht 5\' 6"  (  1.676 m)  Wt 238 lb (107.956 kg)  BMI 38.41 kg/m2  SpO2 98%  Physical Exam CONSTITUTIONAL: Well developed/well nourished HEAD AND FACE: Normocephalic/atraumatic EYES: EOMI/PERRL ENMT: Mucous membranes moist, nasal congestion NECK: supple no meningeal signs SPINE:entire spine nontender CV: S1/S2 noted, no murmurs/rubs/gallops noted LUNGS: no apparent distress, scattered wheezing bilaterally, able to speak to me clearly ABDOMEN: soft, nontender, no rebound or guarding GU:no cva tenderness NEURO: Pt is awake/alert, moves all extremitiesx4 EXTREMITIES: pulses normal, full ROM, chronic/symmetric pitting edema  SKIN: warm, color normal PSYCH: no abnormalities of mood  ED Course  Procedures  DIAGNOSTIC STUDIES: Oxygen Saturation is 98% on room air, normal by my interpretation.    COORDINATION OF CARE:  6:26 PM: EDP at bedside. All results reviewed and discussed with pt, questions answered, pt agreeable with plan.  Pt well appearing, feels improved, no  distress noted She still has some wheeze, I added prednisone to her regimen Pt/family agreeable with plan Suspect viral uri/bronchitis at this time   Dg Chest 2 View  06/25/2011  *RADIOLOGY REPORT*  Clinical Data: Cough  CHEST - 2 VIEW  Comparison: 04/14/2010  Findings: Heart size is mildly enlarged.  No pleural effusion or pulmonary edema.  Lungs appear hyperinflated.  There are coarsened interstitial markings noted bilaterally.  No airspace consolidation identified.  IMPRESSION:  1.  Mild cardiac enlargement. 2.  No acute cardiopulmonary abnormalities.  Original Report Authenticated By: Rosealee Albee, M.D.    The patient appears reasonably screened and/or stabilized for discharge and I doubt any other medical condition or other [facility] requiring further screening, evaluation, or treatment in the ED at this time prior to discharge.     MDM  Nursing notes reviewed and considered in documentation xrays reviewed and considered   I personally performed the services described in this documentation, which was scribed in my presence. The recorded information has been reviewed and considered.            Allison Gaskins, MD 06/25/11 [phone]

## 2011-06-25 NOTE — ED Notes (Signed)
Pt states albuterol may be helpful at this point. RT aware of inh order.

## 2011-06-25 NOTE — ED Notes (Signed)
Cough wheeze, stress incontinence.  Yellow sputum with streak of blood. Pain in ribs from coughing.

## 2011-06-25 NOTE — Discharge Instructions (Signed)
Bronchitis     Bronchitis is a problem of the air tubes leading to your lungs. This problem makes it hard for air to get in and out of the lungs. You may cough a lot because your air tubes are narrow. Going without care can cause lasting (chronic) bronchitis.  HOME CARE   · Drink enough fluids to keep your pee (urine) clear or pale yellow.   · Use a cool mist humidifier.   · Quit smoking if you smoke. If you keep smoking, the bronchitis might not get better.   · Only take medicine as told by your doctor.   GET HELP RIGHT AWAY IF:   · Coughing keeps you awake.   · You start to wheeze.   · You become more sick or weak.   · You have a hard time breathing or get short of breath.   · You cough up blood.   · Coughing lasts more than 2 weeks.   · You have a fever.   · Your baby is older than 3 months with a rectal temperature of 102° F (38.9° C) or higher.   · Your baby is 3 months old or younger with a rectal temperature of 100.4° F (38° C) or higher.   MAKE SURE YOU:  · Understand these instructions.   · Will watch your condition.   · Will get help right away if you are not doing well or get worse.   Document Released: 09/29/2007 Document Revised: 12/23/2010 Document Reviewed: 03/14/2009  [employer]® Patient Information ©2012 [employer].

## 2011-07-01 ENCOUNTER — Inpatient Hospital Stay (HOSPITAL_COMMUNITY)
Admission: EM | Admit: 2011-07-01 | Discharge: 2011-07-03 | DRG: 191 | Disposition: A | Discharge status: Home / Self Care | Payer: Self-pay | Attending: Internal Medicine | Admitting: Internal Medicine

## 2011-07-01 ENCOUNTER — Emergency Department (HOSPITAL_COMMUNITY): Payer: Self-pay

## 2011-07-01 ENCOUNTER — Encounter (HOSPITAL_COMMUNITY): Payer: Self-pay

## 2011-07-01 DIAGNOSIS — D72829 Elevated white blood cell count, unspecified: Secondary | ICD-10-CM | POA: Diagnosis present

## 2011-07-01 DIAGNOSIS — E1169 Type 2 diabetes mellitus with other specified complication: Secondary | ICD-10-CM | POA: Diagnosis present

## 2011-07-01 DIAGNOSIS — K219 Gastro-esophageal reflux disease without esophagitis: Secondary | ICD-10-CM | POA: Diagnosis present

## 2011-07-01 DIAGNOSIS — E782 Mixed hyperlipidemia: Secondary | ICD-10-CM | POA: Diagnosis present

## 2011-07-01 DIAGNOSIS — J45901 Unspecified asthma with (acute) exacerbation: Secondary | ICD-10-CM | POA: Diagnosis present

## 2011-07-01 DIAGNOSIS — I10 Essential (primary) hypertension: Secondary | ICD-10-CM | POA: Diagnosis present

## 2011-07-01 DIAGNOSIS — F3289 Other specified depressive episodes: Secondary | ICD-10-CM | POA: Diagnosis present

## 2011-07-01 DIAGNOSIS — J441 Chronic obstructive pulmonary disease with (acute) exacerbation: Principal | ICD-10-CM | POA: Diagnosis present

## 2011-07-01 DIAGNOSIS — I152 Hypertension secondary to endocrine disorders: Secondary | ICD-10-CM | POA: Diagnosis present

## 2011-07-01 DIAGNOSIS — J440 Chronic obstructive pulmonary disease with acute lower respiratory infection: Secondary | ICD-10-CM | POA: Diagnosis present

## 2011-07-01 DIAGNOSIS — G20 Parkinson's disease: Secondary | ICD-10-CM | POA: Diagnosis present

## 2011-07-01 DIAGNOSIS — Z6841 Body Mass Index (BMI) 40.0 and over, adult: Secondary | ICD-10-CM

## 2011-07-01 DIAGNOSIS — E785 Hyperlipidemia, unspecified: Secondary | ICD-10-CM | POA: Diagnosis present

## 2011-07-01 DIAGNOSIS — T380X5A Adverse effect of glucocorticoids and synthetic analogues, initial encounter: Secondary | ICD-10-CM | POA: Diagnosis present

## 2011-07-01 DIAGNOSIS — R7309 Other abnormal glucose: Secondary | ICD-10-CM | POA: Diagnosis present

## 2011-07-01 DIAGNOSIS — E669 Obesity, unspecified: Secondary | ICD-10-CM | POA: Diagnosis present

## 2011-07-01 DIAGNOSIS — J209 Acute bronchitis, unspecified: Secondary | ICD-10-CM | POA: Diagnosis present

## 2011-07-01 DIAGNOSIS — F411 Generalized anxiety disorder: Secondary | ICD-10-CM | POA: Diagnosis present

## 2011-07-01 DIAGNOSIS — F329 Major depressive disorder, single episode, unspecified: Secondary | ICD-10-CM | POA: Diagnosis present

## 2011-07-01 DIAGNOSIS — Z79899 Other long term (current) drug therapy: Secondary | ICD-10-CM

## 2011-07-01 DIAGNOSIS — IMO0002 Reserved for concepts with insufficient information to code with codable children: Secondary | ICD-10-CM

## 2011-07-01 DIAGNOSIS — E1159 Type 2 diabetes mellitus with other circulatory complications: Secondary | ICD-10-CM | POA: Diagnosis present

## 2011-07-01 DIAGNOSIS — G20A1 Parkinson's disease without dyskinesia, without mention of fluctuations: Secondary | ICD-10-CM | POA: Diagnosis present

## 2011-07-01 LAB — BASIC METABOLIC PANEL
BUN: 20 mg/dL (ref 6–23)
CO2: 23 mEq/L (ref 19–32)
Calcium: 9.4 mg/dL (ref 8.4–10.5)
Chloride: 105 mEq/L (ref 96–112)
Creatinine, Ser: 1.08 mg/dL (ref 0.50–1.10)
GFR calc Af Amer: 63 mL/min — ABNORMAL LOW (ref >90)
GFR calc non Af Amer: 54 mL/min — ABNORMAL LOW (ref >90)
Glucose, Bld: 157 mg/dL — ABNORMAL HIGH (ref 70–99)
Potassium: 4.1 mEq/L (ref 3.5–5.1)
Sodium: 140 mEq/L (ref 135–145)

## 2011-07-01 LAB — CBC
HCT: 45.3 % (ref 36.0–46.0)
Hemoglobin: 15.3 g/dL — ABNORMAL HIGH (ref 12.0–15.0)
MCH: 29.5 pg (ref 26.0–34.0)
MCHC: 33.8 g/dL (ref 30.0–36.0)
MCV: 87.5 fL (ref 78.0–100.0)
Platelets: 334 10*3/uL (ref 150–400)
RBC: 5.18 MIL/uL — ABNORMAL HIGH (ref 3.87–5.11)
RDW: 13.6 % (ref 11.5–15.5)
WBC: 20 10*3/uL — ABNORMAL HIGH (ref 4.0–10.5)

## 2011-07-01 MED ORDER — PANTOPRAZOLE SODIUM 40 MG PO TBEC
40.0000 mg | DELAYED_RELEASE_TABLET | Freq: Every day | ORAL | Status: DC
  Administered 2011-07-01 – 2011-07-03 (×3): 40 mg via ORAL
  Filled 2011-07-01 (×3): qty 1

## 2011-07-01 MED ORDER — ATORVASTATIN CALCIUM 10 MG PO TABS
20.0000 mg | ORAL_TABLET | Freq: Every day | ORAL | Status: DC
  Administered 2011-07-01 – 2011-07-02 (×2): 20 mg via ORAL
  Filled 2011-07-01 (×2): qty 1
  Filled 2011-07-01 (×2): qty 2

## 2011-07-01 MED ORDER — ENOXAPARIN SODIUM 40 MG/0.4ML ~~LOC~~ SOLN
40.0000 mg | SUBCUTANEOUS | Status: DC
  Administered 2011-07-01 – 2011-07-02 (×2): 40 mg via SUBCUTANEOUS
  Filled 2011-07-01 (×2): qty 0.4

## 2011-07-01 MED ORDER — LEVOFLOXACIN 500 MG PO TABS
500.0000 mg | ORAL_TABLET | Freq: Every day | ORAL | Status: DC
  Administered 2011-07-01 – 2011-07-03 (×3): 500 mg via ORAL
  Filled 2011-07-01 (×3): qty 1

## 2011-07-01 MED ORDER — METHYLPREDNISOLONE SODIUM SUCC 125 MG IJ SOLR
125.0000 mg | Freq: Once | INTRAMUSCULAR | Status: AC
  Administered 2011-07-01: 125 mg via INTRAVENOUS
  Filled 2011-07-01: qty 2

## 2011-07-01 MED ORDER — SODIUM CHLORIDE 0.9 % IN NEBU
INHALATION_SOLUTION | RESPIRATORY_TRACT | Status: AC
  Administered 2011-07-01: 3 mL
  Filled 2011-07-01: qty 3

## 2011-07-01 MED ORDER — IPRATROPIUM BROMIDE 0.02 % IN SOLN
0.5000 mg | Freq: Once | RESPIRATORY_TRACT | Status: AC
  Administered 2011-07-01: 0.5 mg via RESPIRATORY_TRACT
  Filled 2011-07-01: qty 2.5

## 2011-07-01 MED ORDER — HYDROCODONE-ACETAMINOPHEN 5-325 MG PO TABS: 1.0000 | ORAL_TABLET | ORAL | Status: DC | PRN

## 2011-07-01 MED ORDER — PREDNISONE 20 MG PO TABS
60.0000 mg | ORAL_TABLET | Freq: Two times a day (BID) | ORAL | Status: DC
  Administered 2011-07-01 – 2011-07-03 (×4): 60 mg via ORAL
  Filled 2011-07-01: qty 1
  Filled 2011-07-01: qty 2
  Filled 2011-07-01 (×3): qty 3

## 2011-07-01 MED ORDER — PRIMIDONE 250 MG PO TABS
250.0000 mg | ORAL_TABLET | Freq: Four times a day (QID) | ORAL | Status: DC
  Administered 2011-07-02 – 2011-07-03 (×5): 250 mg via ORAL
  Filled 2011-07-01 (×16): qty 1

## 2011-07-01 MED ORDER — ACETAMINOPHEN 650 MG RE SUPP: 650.0000 mg | Freq: Four times a day (QID) | RECTAL | Status: DC | PRN

## 2011-07-01 MED ORDER — DM-GUAIFENESIN ER 30-600 MG PO TB12
1.0000 | ORAL_TABLET | Freq: Two times a day (BID) | ORAL | Status: DC | PRN
  Filled 2011-07-01: qty 1

## 2011-07-01 MED ORDER — ONDANSETRON HCL 4 MG/2ML IJ SOLN: 4.0000 mg | Freq: Four times a day (QID) | INTRAMUSCULAR | Status: DC | PRN

## 2011-07-01 MED ORDER — ZOLPIDEM TARTRATE 5 MG PO TABS
5.0000 mg | ORAL_TABLET | Freq: Every evening | ORAL | Status: DC | PRN
  Filled 2011-07-01: qty 1

## 2011-07-01 MED ORDER — SODIUM CHLORIDE 0.9 % IV SOLN: INTRAVENOUS | Status: DC

## 2011-07-01 MED ORDER — ALBUTEROL SULFATE (5 MG/ML) 0.5% IN NEBU
5.0000 mg | INHALATION_SOLUTION | Freq: Once | RESPIRATORY_TRACT | Status: AC
  Administered 2011-07-01: 5 mg via RESPIRATORY_TRACT
  Filled 2011-07-01: qty 1

## 2011-07-01 MED ORDER — SODIUM CHLORIDE 0.9 % IV SOLN: 250.0000 mL | INTRAVENOUS | Status: DC | PRN

## 2011-07-01 MED ORDER — DILTIAZEM HCL 60 MG PO TABS
60.0000 mg | ORAL_TABLET | Freq: Two times a day (BID) | ORAL | Status: DC
  Administered 2011-07-01 – 2011-07-03 (×4): 60 mg via ORAL
  Filled 2011-07-01 (×4): qty 1

## 2011-07-01 MED ORDER — ALUM & MAG HYDROXIDE-SIMETH 200-200-20 MG/5ML PO SUSP: 30.0000 mL | Freq: Four times a day (QID) | ORAL | Status: DC | PRN

## 2011-07-01 MED ORDER — LISINOPRIL 10 MG PO TABS
20.0000 mg | ORAL_TABLET | Freq: Every day | ORAL | Status: DC
  Administered 2011-07-02 – 2011-07-03 (×2): 20 mg via ORAL
  Filled 2011-07-01 (×2): qty 2

## 2011-07-01 MED ORDER — SENNOSIDES-DOCUSATE SODIUM 8.6-50 MG PO TABS: 1.0000 | ORAL_TABLET | Freq: Every evening | ORAL | Status: DC | PRN

## 2011-07-01 MED ORDER — SODIUM CHLORIDE 0.9 % IJ SOLN
3.0000 mL | Freq: Two times a day (BID) | INTRAMUSCULAR | Status: DC
  Administered 2011-07-02 – 2011-07-03 (×2): 3 mL via INTRAVENOUS
  Filled 2011-07-01 (×3): qty 3

## 2011-07-01 MED ORDER — SODIUM CHLORIDE 0.9 % IJ SOLN
3.0000 mL | INTRAMUSCULAR | Status: DC | PRN
  Administered 2011-07-02: 3 mL via INTRAVENOUS

## 2011-07-01 MED ORDER — SODIUM CHLORIDE 0.9 % IV SOLN
INTRAVENOUS | Status: DC
  Administered 2011-07-01: 13:00:00 via INTRAVENOUS

## 2011-07-01 MED ORDER — ONDANSETRON HCL 4 MG PO TABS: 4.0000 mg | ORAL_TABLET | Freq: Four times a day (QID) | ORAL | Status: DC | PRN

## 2011-07-01 MED ORDER — ALBUTEROL SULFATE (5 MG/ML) 0.5% IN NEBU
0.6300 mg | INHALATION_SOLUTION | RESPIRATORY_TRACT | Status: DC
  Administered 2011-07-01 – 2011-07-03 (×9): 2.5 mg via RESPIRATORY_TRACT
  Filled 2011-07-01 (×10): qty 0.5

## 2011-07-01 MED ORDER — ACETAMINOPHEN 325 MG PO TABS: 650.0000 mg | ORAL_TABLET | Freq: Four times a day (QID) | ORAL | Status: DC | PRN

## 2011-07-01 NOTE — ED Notes (Signed)
Report given to [facility] on 300

## 2011-07-01 NOTE — ED Notes (Signed)
Pt. Complains of shortness of breath, lungs show some wheezing bilaterally. Albuterol and Atrovent ordered and respiratory currently in room. Patient was positioned in high fowlers. O2 sats 95.

## 2011-07-01 NOTE — H&P (Signed)
Hospital Admission Note Date: 07/01/2011  Patient name: Allison Rollins Medical record number: [id number] Date of birth: 1951-03-23 Age: 61 y.o. Gender: female [facility]  Attending physician: Christiane Ha, MD  Chief Complaint: Shortness of breath.  History of Present Illness:  Allison Rollins is an 61 y.o. female who presents with a week of worsening cough wheezing and shortness of breath. She has a history of asthma and secondhand tobacco exposure. She has had night sweats but no fevers or chills per se. Her cough is productive of yellow sputum. She has been on Bactrim and prednisone. She has bronchodilators at home. Her symptoms have gotten no better. She feels very dyspneic on exertion. Her appetite has been good. She has no diarrhea. Her chest x-ray shows no pneumonia. Her oxygen saturation is not loud, but she was wheezing quite a bit and became very dyspneic with exertion in the emergency room, so she was admitted to the hospitalist service.  Past Medical History  Diagnosis Date  . S/P colonoscopy 10/15/04    normal  . Kidney stones   . HTN (hypertension)   . Hypoglycemia   . Parkinson's disease   . Anxiety   . Depression   . COPD (chronic obstructive pulmonary disease)   . Asthma     Meds: Prescriptions prior to admission  Medication Sig Dispense Refill  . albuterol (ACCUNEB) 0.63 MG/3ML nebulizer solution Take 1 ampule by nebulization every 6 (six) hours as needed. Shortness of breath      . albuterol (PROVENTIL HFA;VENTOLIN HFA) 108 (90 BASE) MCG/ACT inhaler Inhale 1-2 puffs into the lungs every 6 (six) hours as needed for wheezing.  1 Inhaler  0  . diltiazem (CARDIZEM) 60 MG tablet Take 60 mg by mouth 2 (two) times daily.      . furosemide (LASIX) 20 MG tablet Take 20 mg by mouth daily.      Marland Kitchen lisinopril (PRINIVIL,ZESTRIL) 20 MG tablet Take 20 mg by mouth daily.      Marland Kitchen omeprazole (PRILOSEC) 20 MG capsule Take 20 mg by mouth daily.        . potassium  chloride SA (K-DUR,KLOR-CON) 20 MEQ tablet Take 20 mEq by mouth daily.       . predniSONE (STERAPRED UNI-PAK) 10 MG tablet Take 10-40 mg by mouth daily. To take for 7 days. Take 4 tablets daily for 2 days, then 3 tablets daily for 2 days, then 2 tablets daily for 2 days, the 1 tablet on the last day.      . primidone (MYSOLINE) 250 MG tablet Take 250 mg by mouth 4 (four) times daily.        . rosuvastatin (CRESTOR) 10 MG tablet Take 10 mg by mouth at bedtime.       . sulfamethoxazole-trimethoprim (BACTRIM DS) 800-160 MG per tablet Take 1 tablet by mouth 2 (two) times daily.        Allergies: Penicillins History   Social History  . Marital Status: Married    Spouse Name: N/A    Number of Children: 2  . Years of Education: N/A   Occupational History  . disabled    Social History Main Topics  . Smoking status: Never Smoker   . Smokeless tobacco: Not on file  . Alcohol Use: No  . Drug Use: No  . Sexually Active: Not on file   Other Topics Concern  . Not on file   Social History Narrative  . No narrative on file  Family History  Problem Relation Age of Onset  . COPD Mother   . Coronary artery disease Father   . Coronary artery disease Brother   . Coronary artery disease Sister    Past Surgical History  Procedure Date  . Cholecystectomy   . Kidney stone surgery     Review of Systems: Systems reviewed and as per HPI, otherwise negative.  Physical Exam: Blood pressure 136/71, pulse 64, temperature 97.5 F (36.4 C), temperature source Oral, resp. rate 24, height 5\' 6"  (1.676 m), weight 112 kg (246 lb 14.6 oz), SpO2 95.00%. BP 136/71  Pulse 64  Temp(Src) 97.5 F (36.4 C) (Oral)  Resp 24  Ht 5\' 6"  (1.676 m)  Wt 112 kg (246 lb 14.6 oz)  BMI 39.85 kg/m2  SpO2 95%  General Appearance:    alert. Able to speak in short sentences. Cough is periodically.   Head:    Normocephalic, without obvious abnormality, atraumatic  Eyes:    PERRL, conjunctiva/corneas clear, EOM's  intact, fundi    benign, both eyes  Ears:    Normal TM's and external ear canals, both ears  Nose:   Nares normal, septum midline, mucosa normal, no drainage    or sinus tenderness  Throat:   Lips, mucosa, and tongue normal; poor dentition. and gums normal. Oropharynx without erythema or exudate.  Neck:   Supple, symmetrical, trachea midline, no adenopathy;    thyroid:  Shoddy submandibular lymphadenopathy.; no carotid   bruit or JVD  Back:     Symmetric, no curvature, ROM normal, no CVA tenderness  Lungs:    bilateral wheeze and rhonchi. No rales.   Chest Wall:    No tenderness or deformity   Heart:    Regular rate and rhythm, S1 and S2 normal, no murmur, rub   or gallop     Abdomen:     Soft, non-tender, bowel sounds active all four quadrants,    no masses, no organomegaly  Genitalia:   deferred   Rectal:   deferred   Extremities:   Extremities normal, atraumatic, no cyanosis or edema  Pulses:   2+ and symmetric all extremities  Skin:   Skin color, texture, turgor normal, no rashes or lesions  Lymph nodes:   Cervical, supraclavicular, and axillary nodes normal  Neurologic:   CNII-XII intact, normal strength, sensation and reflexes    throughout     Psychiatric: Normal affect.  Lab results: Basic Metabolic Panel:  Basename 07/01/11 1303  NA 140  K 4.1  CL 105  CO2 23  GLUCOSE 157*  BUN 20  CREATININE 1.08  CALCIUM 9.4  MG --  PHOS --   Liver Function Tests: No results found for this basename: AST:2,ALT:2,ALKPHOS:2,BILITOT:2,PROT:2,ALBUMIN:2 in the last 72 hours No results found for this basename: LIPASE:2,AMYLASE:2 in the last 72 hours No results found for this basename: AMMONIA:2 in the last 72 hours CBC:  Basename 07/01/11 1303  WBC 20.0*  NEUTROABS --  HGB 15.3*  HCT 45.3  MCV 87.5  PLT 334   Imaging results:  Dg Chest 2 View  07/01/2011  *RADIOLOGY REPORT*  Clinical Data: 61 year old female with shortness of breath.  CHEST - 2 VIEW  Comparison: 06/25/2011  and earlier.  Findings: Are stable lung volumes. Stable cardiomegaly and mediastinal contours.  No pneumothorax, pulmonary edema, pleural effusion or acute pulmonary opacity. No acute osseous abnormality identified.  IMPRESSION: No acute cardiopulmonary abnormality.  Original Report Authenticated By: Harley Hallmark, M.D.    Assessment & Plan:  Principal Problem:  *Asthma exacerbation Active Problems:  Acute bronchitis  Benign hypertension  Hyperglycemia  GERD (gastroesophageal reflux disease)  Obesity  Hyperlipidemia  Patient will be admitted. Bronchodilators. Change antibiotics to levofloxacin. Increase prednisone to 60 mg twice a day. Monitor blood glucoses. Hyperglycemia and leukocytosis likely related to steroids. Cough suppressant. Patient has been off her diuretics because of incontinence with coughing. She has a history of peripheral edema which is not too bad currently.  Allin Frix L 07/01/2011, 6:40 PM

## 2011-07-01 NOTE — ED Notes (Signed)
Pt states that she is feeling better after the breathing treatment, does still have expiratory wheezes. Pt given coke per her request. Pt and family updated on plan of care and delay

## 2011-07-01 NOTE — ED Provider Notes (Addendum)
History    Scribed for Shelda Jakes, MD, the patient was seen in room APA01/APA01. This chart was scribed by Katha Cabal.   CSN: [id number]  Arrival date & time 07/01/11  1155   First MD Initiated Contact with Patient 07/01/11 1206      Chief Complaint  Patient presents with  . Shortness of Breath    (Consider location/radiation/quality/duration/timing/severity/associated sxs/prior treatment) HPI  Pt was seen at 12:32 PM   Allison Rollins is a 61 y.o. female who presents to the Emergency Department complaining of gradual worsening of moderate shortness of breath that began about a week ago.  Symptoms are associated with productive cough and congestion.  Patient was seen in ED on 06/25/11 for cough and was discharged with inhaler and steroids.  [facility] Septrapred.  Patient has used several breathing treatments at home without relief.  No oxygen use at home.  Patient has history of asthma and COPD.    [facility]     Past Medical History  Diagnosis Date  . S/P colonoscopy 10/15/04    normal  . Kidney stones   . HTN (hypertension)   . Hypoglycemia   . Parkinson's disease   . Anxiety   . Depression   . COPD (chronic obstructive pulmonary disease)   . Asthma     Past Surgical History  Procedure Date  . Cholecystectomy   . Kidney stone surgery     Family History  Problem Relation Age of Onset  . COPD Mother   . Coronary artery disease Father   . Coronary artery disease Brother   . Coronary artery disease Sister     History  Substance Use Topics  . Smoking status: Never Smoker   . Smokeless tobacco: Not on file  . Alcohol Use: No    OB History    Grav Para Term Preterm Abortions TAB SAB Ect Mult Living                  Review of Systems  All other systems reviewed and are negative.   Remaining review of systems negative except as noted in the HPI.   Allergies  Penicillins and Sulfa  antibiotics  Home Medications   Current Outpatient Rx  Name Route Sig Dispense Refill  . ALBUTEROL SULFATE 0.63 MG/3ML IN NEBU Nebulization Take 1 ampule by nebulization every 6 (six) hours as needed. Shortness of breath    . ALBUTEROL SULFATE HFA 108 (90 BASE) MCG/ACT IN AERS Inhalation Inhale 1-2 puffs into the lungs every 6 (six) hours as needed for wheezing. 1 Inhaler 0  . DILTIAZEM HCL 60 MG PO TABS Oral Take 60 mg by mouth 2 (two) times daily.    . FUROSEMIDE 20 MG PO TABS Oral Take 20 mg by mouth daily.    Marland Kitchen LISINOPRIL 20 MG PO TABS Oral Take 20 mg by mouth daily.    Marland Kitchen OMEPRAZOLE 20 MG PO CPDR Oral Take 20 mg by mouth daily.      Marland Kitchen POTASSIUM CHLORIDE CRYS ER 20 MEQ PO TBCR Oral Take 20 mEq by mouth daily.     Marland Kitchen PREDNISONE (PAK) 10 MG PO TABS Oral Take 10-40 mg by mouth daily. To take for 7 days. Take 4 tablets daily for 2 days, then 3 tablets daily for 2 days, then 2 tablets daily for 2 days, the 1 tablet on the last day.    Marland Kitchen PRIMIDONE 250 MG  PO TABS Oral Take 250 mg by mouth 4 (four) times daily.      Marland Kitchen ROSUVASTATIN CALCIUM 10 MG PO TABS Oral Take 10 mg by mouth at bedtime.     . SULFAMETHOXAZOLE-TMP DS 800-160 MG PO TABS Oral Take 1 tablet by mouth 2 (two) times daily.      BP 127/64  Pulse 65  Temp(Src) 98.5 F (36.9 C) (Oral)  Resp 22  Ht 5\' 6"  (1.676 m)  Wt 248 lb (112.492 kg)  BMI 40.03 kg/m2  SpO2 94%  Physical Exam  Constitutional: She is oriented to person, place, and time. She appears well-developed.  Non-toxic appearance.  HENT:  Head: Normocephalic and atraumatic.  Eyes: Conjunctivae and EOM are normal.  Neck: Neck supple.  Cardiovascular: Normal rate, regular rhythm and normal heart sounds.   Pulmonary/Chest: No respiratory distress. She has wheezes.       Bilateral wheezing   Abdominal: Soft. Bowel sounds are normal. There is no tenderness.  Musculoskeletal: Normal range of motion. She exhibits no edema.  Neurological: She is alert and oriented to person,  place, and time. Coordination normal.  Skin: Skin is warm and dry. No rash noted.  Psychiatric: She has a normal mood and affect. Her behavior is normal.    ED Course  Procedures (including critical care time)   DIAGNOSTIC STUDIES: Oxygen Saturation is 94% on room air, adequate by my interpretation.     COORDINATION OF CARE: 12:30 PM  Atrovent and Albuterol nebulizer treatment.  12:37 PM  Physical exam complete.  IV fluids.   2:15 PM   Atrovent and Albuterol nebulizer treatment.      LABS / RADIOLOGY:   Labs Reviewed  CBC - Abnormal; Notable for the following:    WBC 20.0 (*)    RBC 5.18 (*)    Hemoglobin 15.3 (*)    All other components within normal limits  BASIC METABOLIC PANEL - Abnormal; Notable for the following:    Glucose, Bld 157 (*)    GFR calc non Af Amer 54 (*)    GFR calc Af Amer 63 (*)    All other components within normal limits   Results for orders placed during the hospital encounter of 07/01/11  CBC      Component Value Range   WBC 20.0 (*) 4.0 - 10.5 (K/uL)   RBC 5.18 (*) 3.87 - 5.11 (MIL/uL)   Hemoglobin 15.3 (*) 12.0 - 15.0 (g/dL)   HCT 40.9  81.1 - 91.4 (%)   MCV 87.5  78.0 - 100.0 (fL)   MCH 29.5  26.0 - 34.0 (pg)   MCHC 33.8  30.0 - 36.0 (g/dL)   RDW 78.2  95.6 - 21.3 (%)   Platelets 334  150 - 400 (K/uL)  BASIC METABOLIC PANEL      Component Value Range   Sodium 140  135 - 145 (mEq/L)   Potassium 4.1  3.5 - 5.1 (mEq/L)   Chloride 105  96 - 112 (mEq/L)   CO2 23  19 - 32 (mEq/L)   Glucose, Bld 157 (*) 70 - 99 (mg/dL)   BUN 20  6 - 23 (mg/dL)   Creatinine, Ser 0.86  0.50 - 1.10 (mg/dL)   Calcium 9.4  8.4 - 57.8 (mg/dL)   GFR calc non Af Amer 54 (*) >90 (mL/min)   GFR calc Af Amer 63 (*) >90 (mL/min)    Dg Chest 2 View  07/01/2011  *RADIOLOGY REPORT*  Clinical Data: 61 year old female with shortness of  breath.  CHEST - 2 VIEW  Comparison: 06/25/2011 and earlier.  Findings: Are stable lung volumes. Stable cardiomegaly and mediastinal  contours.  No pneumothorax, pulmonary edema, pleural effusion or acute pulmonary opacity. No acute osseous abnormality identified.  IMPRESSION: No acute cardiopulmonary abnormality.  Original Report Authenticated By: Harley Hallmark, M.D.         MDM  [facility] he treated with albuterol and albuterol nebs and 2 courses of prednisone comes in with acute shortness of breath and wheezing improved her first nebulizer but wheezing not resolved given second nebulizer at the same albuterol and Atrovent wheezing persisted also given 125 mg aside a Medrol IV piggyback. This x-ray negative for pneumonia pulmonary edema or pneumothorax. Patient preferred to go home was suspicious that she wasn't going to be able to clear her wheezing persisted we got her up to walk she was short of breath. She has agreed to be admitted. Hospitalist team to admit her to medical surge bed. Patient stable and resting room air sats in the 94% no acute distress still with some mild wheezing we'll order one more nebulizer while waiting for admission. Marked leukocytosis however patient has been on 2 courses of prednisone suspect some relationship to that as stated above chest x-ray without evidence of pneumonia.       MEDICATIONS GIVEN IN THE E.D. Scheduled Meds:    . sodium chloride   Intravenous STAT  . albuterol  5 mg Nebulization Once  . albuterol  5 mg Nebulization Once  . ipratropium  0.5 mg Nebulization Once  . ipratropium  0.5 mg Nebulization Once  . methylPREDNISolone sodium succinate  125 mg Intravenous Once   Continuous Infusions:    . sodium chloride 100 mL/hr at 07/01/11 1316       IMPRESSION: 1. COPD exacerbation      NEW MEDICATIONS: New Prescriptions   No medications on file       I personally performed the services described in this documentation, which was scribed in my presence. The recorded information has been reviewed and considered.       Shelda Jakes, MD 07/01/11 6213  Shelda Jakes, MD 07/01/11 [phone]

## 2011-07-01 NOTE — ED Notes (Signed)
Called into pt's room, pt c/o being wet from urinating on herself when she coughs, bed linen changed, pt given mesh panties and maxi pad per her request, pt c/o wheezing, Dr. Deretha Emory notified, additional orders given

## 2011-07-01 NOTE — ED Notes (Signed)
Pt becomes very SOB with any movement. Pt states she is so weak it is hard for her to do anything for her self

## 2011-07-01 NOTE — ED Notes (Signed)
Pt states she was her last Friday for cold/congestion. States she was placed on an antibiotic, given an inhaler and a breathing machine. States she is weak and not getting any better.

## 2011-07-01 NOTE — ED Notes (Signed)
In to speak with EDP regarding plan of care for pt. Pt agrees to be admit to the hospital.  EDP back in with pt

## 2011-07-02 LAB — GLUCOSE, CAPILLARY
Glucose-Capillary: 191 mg/dL — ABNORMAL HIGH (ref 70–99)
Glucose-Capillary: 153 mg/dL — ABNORMAL HIGH (ref 70–99)
Glucose-Capillary: 142 mg/dL — ABNORMAL HIGH (ref 70–99)
Glucose-Capillary: 155 mg/dL — ABNORMAL HIGH (ref 70–99)

## 2011-07-02 MED ORDER — BENZONATATE 100 MG PO CAPS
100.0000 mg | ORAL_CAPSULE | Freq: Three times a day (TID) | ORAL | Status: DC
  Administered 2011-07-02 – 2011-07-03 (×4): 100 mg via ORAL
  Filled 2011-07-02 (×4): qty 1

## 2011-07-02 MED ORDER — INSULIN ASPART 100 UNIT/ML ~~LOC~~ SOLN
0.0000 [IU] | Freq: Three times a day (TID) | SUBCUTANEOUS | Status: DC
  Administered 2011-07-02: 2 [IU] via SUBCUTANEOUS
  Administered 2011-07-02 – 2011-07-03 (×2): 3 [IU] via SUBCUTANEOUS
  Filled 2011-07-02: qty 3

## 2011-07-02 MED ORDER — INSULIN ASPART 100 UNIT/ML ~~LOC~~ SOLN: 0.0000 [IU] | Freq: Every day | SUBCUTANEOUS | Status: DC

## 2011-07-02 MED ORDER — INSULIN GLARGINE 100 UNIT/ML ~~LOC~~ SOLN
10.0000 [IU] | Freq: Every day | SUBCUTANEOUS | Status: DC
  Administered 2011-07-02: 10 [IU] via SUBCUTANEOUS
  Filled 2011-07-02: qty 3

## 2011-07-02 MED ORDER — SODIUM CHLORIDE 0.9 % IN NEBU
INHALATION_SOLUTION | RESPIRATORY_TRACT | Status: AC
  Administered 2011-07-02: 3 mL
  Filled 2011-07-02: qty 3

## 2011-07-02 NOTE — Progress Notes (Signed)
Uneventful shift. No changes since PM assessment. Pt found it hard to fall asleep last night. Offered sleeping med but pt refused. No complaints of any SOB. No wheezing noted.

## 2011-07-02 NOTE — Progress Notes (Signed)
Subjective: She says that she is breathing a little bit better. She continues to have a dry cough. She continues to be short of breath when she ambulates.  Objective: Vital signs in last 24 hours: Filed Vitals:   07/01/11 2112 07/02/11 0433 07/02/11 0521 07/02/11 0718  BP: 148/71  144/72   Pulse: 68  70 63  Temp: 97.6 F (36.4 C)  98 F (36.7 C)   TempSrc: Oral     Resp: 20  20 19   Height:      Weight:   111.948 kg (246 lb 12.8 oz)   SpO2: 93% 93% 95% 95%    Intake/Output Summary (Last 24 hours) at 07/02/11 1103 Last data filed at 07/02/11 0556  Gross per 24 hour  Intake 178.67 ml  Output    700 ml  Net -521.33 ml    Weight change:   Lungs: A few expiratory wheezes, mostly in the upper lobes. Query pseudo-wheezing. Heart: S1, S2, with no murmurs rubs or gallops. Abdomen: Obese, positive bowel sounds, soft, nontender, nondistended. Extremities: No pedal edema. Neurologic: She is alert and oriented x3. Cranial nerves II through XII are intact.  Lab Results: Basic Metabolic Panel:  Basename 07/01/11 1303  NA 140  K 4.1  CL 105  CO2 23  GLUCOSE 157*  BUN 20  CREATININE 1.08  CALCIUM 9.4  MG --  PHOS --   Liver Function Tests: No results found for this basename: AST:2,ALT:2,ALKPHOS:2,BILITOT:2,PROT:2,ALBUMIN:2 in the last 72 hours No results found for this basename: LIPASE:2,AMYLASE:2 in the last 72 hours No results found for this basename: AMMONIA:2 in the last 72 hours CBC:  Basename 07/01/11 1303  WBC 20.0*  NEUTROABS --  HGB 15.3*  HCT 45.3  MCV 87.5  PLT 334   Cardiac Enzymes: No results found for this basename: CKTOTAL:3,CKMB:3,CKMBINDEX:3,TROPONINI:3 in the last 72 hours BNP: No results found for this basename: PROBNP:3 in the last 72 hours D-Dimer: No results found for this basename: DDIMER:2 in the last 72 hours CBG:  Basename 07/02/11 0649  GLUCAP 191*   Hemoglobin A1C: No results found for this basename: HGBA1C in the last 72  hours Fasting Lipid Panel: No results found for this basename: CHOL,HDL,LDLCALC,TRIG,CHOLHDL,LDLDIRECT in the last 72 hours Thyroid Function Tests: No results found for this basename: TSH,T4TOTAL,FREET4,T3FREE,THYROIDAB in the last 72 hours Anemia Panel: No results found for this basename: VITAMINB12,FOLATE,FERRITIN,TIBC,IRON,RETICCTPCT in the last 72 hours Coagulation: No results found for this basename: LABPROT:2,INR:2 in the last 72 hours Urine Drug Screen: Drugs of Abuse  No results found for this basename: labopia, cocainscrnur, labbenz, amphetmu, thcu, labbarb    Alcohol Level: No results found for this basename: ETH:2 in the last 72 hours Urinalysis: No results found for this basename: COLORURINE:2,APPERANCEUR:2,LABSPEC:2,PHURINE:2,GLUCOSEU:2,HGBUR:2,BILIRUBINUR:2,KETONESUR:2,PROTEINUR:2,UROBILINOGEN:2,NITRITE:2,LEUKOCYTESUR:2 in the last 72 hours Misc. Labs:   Micro: No results found for this or any previous visit (from the past 240 hour(s)).  Studies/Results: Dg Chest 2 View  07/01/2011  *RADIOLOGY REPORT*  Clinical Data: 61 year old female with shortness of breath.  CHEST - 2 VIEW  Comparison: 06/25/2011 and earlier.  Findings: Are stable lung volumes. Stable cardiomegaly and mediastinal contours.  No pneumothorax, pulmonary edema, pleural effusion or acute pulmonary opacity. No acute osseous abnormality identified.  IMPRESSION: No acute cardiopulmonary abnormality.  Original Report Authenticated By: Harley Hallmark, M.D.    Medications: I have reviewed the patient's current medications.  Assessment: Principal Problem:  *Asthma exacerbation Active Problems:  GERD (gastroesophageal reflux disease)  Acute bronchitis  Obesity  Benign hypertension  Hyperglycemia  Hyperlipidemia  Leukocytosis   1. Acute asthma exacerbation with a history of bronchitis and COPD. She is improving slightly on Levaquin, bronchodilators, and prednisone.  Steroid-induced hyperglycemia. We'll  start sliding scale NovoLog and Lantus.  Steroid-induced leukocytosis.  Hypertension. Stable on antihypertensive medications..  Plan:  1. Continue current management , with exceptions below. 2. Add sliding scale NovoLog and Lantus for steroid-induced hyperglycemia. We'll check a hemoglobin A1c. 3. Supportive treatment with as needed antitussives.    LOS: 1 day   Siyona Coto 07/02/2011, 11:03 AM

## 2011-07-02 NOTE — Progress Notes (Signed)
CARE MANAGEMENT NOTE 07/02/2011  Patient:  Allison Rollins, Allison Rollins   Account Number:  [id number]  Date Initiated:  07/02/2011  Documentation initiated by:  Rosemary Holms  Subjective/Objective Assessment:   Pt admitted with Asthma/Broncitis. Prior to admission pt lived at home with her family. Her PCP is Carollee Herter at the [facility] where she is registered and well known. Obtains her meds from [facility].     Action/Plan:   Pt declines HH but will follow up as needed with Carollee Herter at the [facility].   Anticipated DC Date:  07/03/2011   Anticipated DC Plan:  HOME/SELF CARE  In-house referral  Financial Counselor      DC Planning Services  CM consult      Choice offered to / List presented to:             Status of service:  In process, will continue to follow Medicare Important Message given?   (If response is "NO", the following Medicare IM given date fields will be blank) Date Medicare IM given:   Date Additional Medicare IM given:    Discharge Disposition:    Per UR Regulation:    Comments:  07/02/11 1500 Joneric Streight Leanord Hawking RN BSN CM

## 2011-07-03 LAB — DIFFERENTIAL
Basophils Absolute: 0 10*3/uL (ref 0.0–0.1)
Basophils Relative: 0 % (ref 0–1)
Eosinophils Absolute: 0 10*3/uL (ref 0.0–0.7)
Eosinophils Relative: 0 % (ref 0–5)
Lymphocytes Relative: 5 % — ABNORMAL LOW (ref 12–46)
Lymphs Abs: 0.8 10*3/uL (ref 0.7–4.0)
Monocytes Absolute: 0.6 10*3/uL (ref 0.1–1.0)
Monocytes Relative: 4 % (ref 3–12)
Neutro Abs: 15.1 10*3/uL — ABNORMAL HIGH (ref 1.7–7.7)
Neutrophils Relative %: 92 % — ABNORMAL HIGH (ref 43–77)

## 2011-07-03 LAB — BASIC METABOLIC PANEL
BUN: 18 mg/dL (ref 6–23)
CO2: 26 mEq/L (ref 19–32)
Calcium: 9.2 mg/dL (ref 8.4–10.5)
Chloride: 100 mEq/L (ref 96–112)
Creatinine, Ser: 0.75 mg/dL (ref 0.50–1.10)
GFR calc Af Amer: >90 mL/min (ref >90)
GFR calc non Af Amer: 90 mL/min — ABNORMAL LOW (ref >90)
Glucose, Bld: 184 mg/dL — ABNORMAL HIGH (ref 70–99)
Potassium: 4.6 mEq/L (ref 3.5–5.1)
Sodium: 136 mEq/L (ref 135–145)

## 2011-07-03 LAB — GLUCOSE, CAPILLARY
Glucose-Capillary: 169 mg/dL — ABNORMAL HIGH (ref 70–99)
Glucose-Capillary: 162 mg/dL — ABNORMAL HIGH (ref 70–99)

## 2011-07-03 LAB — CBC
HCT: 41.5 % (ref 36.0–46.0)
Hemoglobin: 13.9 g/dL (ref 12.0–15.0)
MCH: 29.3 pg (ref 26.0–34.0)
MCHC: 33.5 g/dL (ref 30.0–36.0)
MCV: 87.6 fL (ref 78.0–100.0)
Platelets: 266 10*3/uL (ref 150–400)
RBC: 4.74 MIL/uL (ref 3.87–5.11)
RDW: 13.8 % (ref 11.5–15.5)
WBC: 16.4 10*3/uL — ABNORMAL HIGH (ref 4.0–10.5)

## 2011-07-03 LAB — HEMOGLOBIN A1C
Hgb A1c MFr Bld: 6.2 % — ABNORMAL HIGH (ref <5.7)
Mean Plasma Glucose: 131 mg/dL — ABNORMAL HIGH (ref <117)

## 2011-07-03 MED ORDER — PREDNISONE 10 MG PO TABS: ORAL_TABLET | ORAL | Status: DC

## 2011-07-03 MED ORDER — ALBUTEROL SULFATE HFA 108 (90 BASE) MCG/ACT IN AERS: 1.0000 | INHALATION_SPRAY | RESPIRATORY_TRACT | Status: DC | PRN

## 2011-07-03 MED ORDER — LEVOFLOXACIN 500 MG PO TABS: 500.0000 mg | ORAL_TABLET | Freq: Every day | ORAL | Status: AC

## 2011-07-03 MED ORDER — ALBUTEROL SULFATE 0.63 MG/3ML IN NEBU: INHALATION_SOLUTION | RESPIRATORY_TRACT | Status: DC

## 2011-07-03 NOTE — Discharge Summary (Signed)
Physician Discharge Summary  Allison Rollins MRN: [id number] DOB/AGE: [birth year] 61 y.o.  PCP: No primary provider on file.   Admit date: 07/01/2011 Discharge date: 07/03/2011  Discharge Diagnoses:  1. Acute asthmatic bronchitis. 2. Steroid-induced hyperglycemia. 3. Steroid-induced leukocytosis. 4. Gastroesophageal reflux disease. 5. Hyperlipidemia. 6. Morbid obesity.   Medication List  As of 07/03/2011  2:25 PM   STOP taking these medications         sulfamethoxazole-trimethoprim 800-160 MG per tablet         TAKE these medications         albuterol 0.63 MG/3ML nebulizer solution   Commonly known as: ACCUNEB   USE 3 TIMES DAILY FOR 1 MORE WEEK THEN EVERY 4 HOURS AS NEEDED FOR WHEEZING AND SHORTNESS OF BREATH.      albuterol 108 (90 BASE) MCG/ACT inhaler   Commonly known as: PROVENTIL HFA;VENTOLIN HFA   Inhale 1-2 puffs into the lungs every 4 (four) hours as needed for wheezing or shortness of breath.      diltiazem 60 MG tablet   Commonly known as: CARDIZEM   Take 60 mg by mouth 2 (two) times daily.      furosemide 20 MG tablet   Commonly known as: LASIX   Take 20 mg by mouth daily.      levofloxacin 500 MG tablet   Commonly known as: LEVAQUIN   Take 1 tablet (500 mg total) by mouth daily. TAKE ANTIBIOTIC FOR 5 MORE DAYS.      lisinopril 20 MG tablet   Commonly known as: PRINIVIL,ZESTRIL   Take 20 mg by mouth daily.      omeprazole 20 MG capsule   Commonly known as: PRILOSEC   Take 20 mg by mouth daily.      potassium chloride SA 20 MEQ tablet   Commonly known as: K-DUR,KLOR-CON   Take 20 mEq by mouth daily.      predniSONE 10 MG tablet   Commonly known as: DELTASONE   TAKE 6 TABS DAILY STARTING TOMORROW FOR 1 DAY; THEN TAKE 5 TABS THE NEXT DAY; THEN 4 TABS THE NEXT DAY; THEN 3 TABS THE NEXT DAY; THEN 2 TABS THE NEXT DAY; THEN 1 TAB THE NEXT DAY; THEN STOP.      primidone 250 MG tablet   Commonly known as: MYSOLINE   Take 250 mg by mouth 4 (four)  times daily.      rosuvastatin 10 MG tablet   Commonly known as: CRESTOR   Take 10 mg by mouth at bedtime.            Discharge Condition: Improved and stable.  Disposition: 01-Home or Self Care   Consults: None.   Significant Diagnostic Studies: Dg Chest 2 View  07/01/2011  *RADIOLOGY REPORT*  Clinical Data: 61 year old female with shortness of breath.  CHEST - 2 VIEW  Comparison: 06/25/2011 and earlier.  Findings: Are stable lung volumes. Stable cardiomegaly and mediastinal contours.  No pneumothorax, pulmonary edema, pleural effusion or acute pulmonary opacity. No acute osseous abnormality identified.  IMPRESSION: No acute cardiopulmonary abnormality.  Original Report Authenticated By: Harley Hallmark, M.D.   Dg Chest 2 View  06/25/2011  *RADIOLOGY REPORT*  Clinical Data: Cough  CHEST - 2 VIEW  Comparison: 04/14/2010  Findings: Heart size is mildly enlarged.  No pleural effusion or pulmonary edema.  Lungs appear hyperinflated.  There are coarsened interstitial markings noted bilaterally.  No airspace consolidation identified.  IMPRESSION:  1.  Mild cardiac enlargement. 2.  No acute cardiopulmonary abnormalities.  Original Report Authenticated By: Rosealee Albee, M.D.     Microbiology: No results found for this or any previous visit (from the past 240 hour(s)).   Labs: Results for orders placed during the hospital encounter of 07/01/11 (from the past 48 hour(s))  GLUCOSE, CAPILLARY     Status: Abnormal   Collection Time   07/02/11  6:49 AM      Component Value Range Comment   Glucose-Capillary 191 (*) 70 - 99 (mg/dL)    Comment 1 Notify RN     GLUCOSE, CAPILLARY     Status: Abnormal   Collection Time   07/02/11 11:44 AM      Component Value Range Comment   Glucose-Capillary 153 (*) 70 - 99 (mg/dL)   GLUCOSE, CAPILLARY     Status: Abnormal   Collection Time   07/02/11  4:30 PM      Component Value Range Comment   Glucose-Capillary 142 (*) 70 - 99 (mg/dL)    Comment 1 Notify  RN      Comment 2 Documented in Chart     GLUCOSE, CAPILLARY     Status: Abnormal   Collection Time   07/02/11  9:25 PM      Component Value Range Comment   Glucose-Capillary 155 (*) 70 - 99 (mg/dL)    Comment 1 Notify RN     CBC     Status: Abnormal   Collection Time   07/03/11  5:53 AM      Component Value Range Comment   WBC 16.4 (*) 4.0 - 10.5 (K/uL)    RBC 4.74  3.87 - 5.11 (MIL/uL)    Hemoglobin 13.9  12.0 - 15.0 (g/dL)    HCT 16.1  09.6 - 04.5 (%)    MCV 87.6  78.0 - 100.0 (fL)    MCH 29.3  26.0 - 34.0 (pg)    MCHC 33.5  30.0 - 36.0 (g/dL)    RDW 40.9  81.1 - 91.4 (%)    Platelets 266  150 - 400 (K/uL)   BASIC METABOLIC PANEL     Status: Abnormal   Collection Time   07/03/11  5:53 AM      Component Value Range Comment   Sodium 136  135 - 145 (mEq/L)    Potassium 4.6  3.5 - 5.1 (mEq/L)    Chloride 100  96 - 112 (mEq/L)    CO2 26  19 - 32 (mEq/L)    Glucose, Bld 184 (*) 70 - 99 (mg/dL)    BUN 18  6 - 23 (mg/dL)    Creatinine, Ser 7.82  0.50 - 1.10 (mg/dL)    Calcium 9.2  8.4 - 10.5 (mg/dL)    GFR calc non Af Amer 90 (*) >90 (mL/min)    GFR calc Af Amer >90  >90 (mL/min)   DIFFERENTIAL     Status: Abnormal   Collection Time   07/03/11  5:53 AM      Component Value Range Comment   Neutrophils Relative 92 (*) 43 - 77 (%)    Neutro Abs 15.1 (*) 1.7 - 7.7 (K/uL)    Lymphocytes Relative 5 (*) 12 - 46 (%)    Lymphs Abs 0.8  0.7 - 4.0 (K/uL)    Monocytes Relative 4  3 - 12 (%)    Monocytes Absolute 0.6  0.1 - 1.0 (K/uL)    Eosinophils Relative 0  0 - 5 (%)    Eosinophils Absolute 0.0  0.0 - 0.7 (K/uL)    Basophils Relative 0  0 - 1 (%)    Basophils Absolute 0.0  0.0 - 0.1 (K/uL)   GLUCOSE, CAPILLARY     Status: Abnormal   Collection Time   07/03/11  7:54 AM      Component Value Range Comment   Glucose-Capillary 169 (*) 70 - 99 (mg/dL)    Comment 1 Notify RN     GLUCOSE, CAPILLARY     Status: Abnormal   Collection Time   07/03/11 12:16 PM      Component Value Range Comment     Glucose-Capillary 162 (*) 70 - 99 (mg/dL)    Comment 1 Notify RN        HPI : The patient is a 61 year old woman with a past medical history significant for asthma, hypertension, and Parkinson's disease, who presented to the emergency department on 07/01/2011 with a chief complaint of worsening cough, wheezing, shortness of breath. She had been recently started on Bactrim and prednisone by her primary care provider. She did not improve. In the emergency department, she was afebrile and hemodynamically stable. She was oxygenating 95% on supplemental oxygen. Her lab data were significant for a glucose of 157, WBC of 20,000, and hemoglobin 15.3. Her chest x-ray revealed no acute cardiopulmonary disease. She was admitted for further evaluation and management.  HOSPITAL COURSE: The patient was started on treatment with albuterol and Atrovent nebulizers, antibiotic treatment with Levaquin, and continued prednisone which was increased to 60 mg twice a day. Her capillary blood glucose was monitored before each meal and at bedtime. Because of steroid-induced hyperglycemia, sliding scale NovoLog and Lantus were started. Oxygen was titrated to keep her oxygen saturations greater than 92%. Symptomatic treatment was started with Tessalon Perles.  Over the course of the hospitalization, the patient's symptoms subsided. She improved symptomatically. She remained hemodynamically stable and afebrile. Her white blood cell count decreased to 16.4 prior to discharge. Leukocytosis was thought to be secondary to steroid treatment in part. Her capillary blood glucose ranged from 140-190. At the time of discharge, she was instructed to taper prednisone accordingly. It is anticipated that her blood glucose will normalize once she is tapered off of steroids. She was also discharged on Levaquin for 5 more days for total of 7 days of treatment.    Discharge Exam: Blood pressure 130/63, pulse 67, temperature 97.7 F (36.5 C),  temperature source Oral, resp. rate 19, height 5\' 6"  (1.676 m), weight 111.948 kg (246 lb 12.8 oz), SpO2 94.00%.  Lungs: Occasional wheezes, significantly decreased from yesterday. Heart: S1, S2, with no murmurs rubs or gallops. Abdomen: Positive bowel sounds, soft, nontender, nondistended. Extremities: No pedal edema.   Discharge Orders    Future Orders Please Complete By Expires   Diet - low sodium heart healthy      Increase activity slowly        Discharge time: 35 minutes.   Signed: Brevan Luberto 07/03/2011, 2:25 PM

## 2011-07-19 ENCOUNTER — Encounter: Payer: Self-pay | Admitting: Gastroenterology

## 2011-08-18 ENCOUNTER — Encounter: Payer: Self-pay | Admitting: Internal Medicine

## 2011-08-19 ENCOUNTER — Ambulatory Visit: Payer: Self-pay | Admitting: Gastroenterology

## 2012-01-25 ENCOUNTER — Other Ambulatory Visit (HOSPITAL_COMMUNITY): Payer: Self-pay | Admitting: Physician Assistant

## 2012-01-25 DIAGNOSIS — Z139 Encounter for screening, unspecified: Secondary | ICD-10-CM

## 2012-02-07 ENCOUNTER — Ambulatory Visit (HOSPITAL_COMMUNITY)
Admission: RE | Admit: 2012-02-07 | Discharge: 2012-02-07 | Disposition: A | Discharge status: Home / Self Care | Payer: Self-pay | Source: Ambulatory Visit | Attending: Physician Assistant | Admitting: Physician Assistant

## 2012-02-07 DIAGNOSIS — Z139 Encounter for screening, unspecified: Secondary | ICD-10-CM

## 2012-11-26 ENCOUNTER — Encounter (HOSPITAL_COMMUNITY): Payer: Self-pay | Admitting: *Deleted

## 2012-11-26 ENCOUNTER — Emergency Department (HOSPITAL_COMMUNITY)
Admission: EM | Admit: 2012-11-26 | Discharge: 2012-11-26 | Disposition: A | Discharge status: Home / Self Care | Payer: Self-pay | Attending: Emergency Medicine | Admitting: Emergency Medicine

## 2012-11-26 DIAGNOSIS — Y929 Unspecified place or not applicable: Secondary | ICD-10-CM | POA: Insufficient documentation

## 2012-11-26 DIAGNOSIS — Z8659 Personal history of other mental and behavioral disorders: Secondary | ICD-10-CM | POA: Insufficient documentation

## 2012-11-26 DIAGNOSIS — IMO0002 Reserved for concepts with insufficient information to code with codable children: Secondary | ICD-10-CM | POA: Insufficient documentation

## 2012-11-26 DIAGNOSIS — I10 Essential (primary) hypertension: Secondary | ICD-10-CM | POA: Insufficient documentation

## 2012-11-26 DIAGNOSIS — Z8639 Personal history of other endocrine, nutritional and metabolic disease: Secondary | ICD-10-CM | POA: Insufficient documentation

## 2012-11-26 DIAGNOSIS — S29019A Strain of muscle and tendon of unspecified wall of thorax, initial encounter: Secondary | ICD-10-CM

## 2012-11-26 DIAGNOSIS — G20 Parkinson's disease: Secondary | ICD-10-CM | POA: Insufficient documentation

## 2012-11-26 DIAGNOSIS — Z880 Allergy status to penicillin: Secondary | ICD-10-CM | POA: Insufficient documentation

## 2012-11-26 DIAGNOSIS — X503XXA Overexertion from repetitive movements, initial encounter: Secondary | ICD-10-CM | POA: Insufficient documentation

## 2012-11-26 DIAGNOSIS — Y990 Civilian activity done for income or pay: Secondary | ICD-10-CM | POA: Insufficient documentation

## 2012-11-26 DIAGNOSIS — Z79899 Other long term (current) drug therapy: Secondary | ICD-10-CM | POA: Insufficient documentation

## 2012-11-26 DIAGNOSIS — X500XXA Overexertion from strenuous movement or load, initial encounter: Secondary | ICD-10-CM | POA: Insufficient documentation

## 2012-11-26 DIAGNOSIS — Z87442 Personal history of urinary calculi: Secondary | ICD-10-CM | POA: Insufficient documentation

## 2012-11-26 DIAGNOSIS — J449 Chronic obstructive pulmonary disease, unspecified: Secondary | ICD-10-CM | POA: Insufficient documentation

## 2012-11-26 DIAGNOSIS — Z862 Personal history of diseases of the blood and blood-forming organs and certain disorders involving the immune mechanism: Secondary | ICD-10-CM | POA: Insufficient documentation

## 2012-11-26 DIAGNOSIS — J4489 Other specified chronic obstructive pulmonary disease: Secondary | ICD-10-CM | POA: Insufficient documentation

## 2012-11-26 DIAGNOSIS — G20A1 Parkinson's disease without dyskinesia, without mention of fluctuations: Secondary | ICD-10-CM | POA: Insufficient documentation

## 2012-11-26 DIAGNOSIS — Y9389 Activity, other specified: Secondary | ICD-10-CM | POA: Insufficient documentation

## 2012-11-26 MED ORDER — HYDROCODONE-ACETAMINOPHEN 5-325 MG PO TABS: 1.0000 | ORAL_TABLET | ORAL | Status: DC | PRN

## 2012-11-26 MED ORDER — CYCLOBENZAPRINE HCL 10 MG PO TABS: 10.0000 mg | ORAL_TABLET | Freq: Two times a day (BID) | ORAL | Status: DC | PRN

## 2012-11-26 MED ORDER — CYCLOBENZAPRINE HCL 10 MG PO TABS
10.0000 mg | ORAL_TABLET | Freq: Once | ORAL | Status: AC
  Administered 2012-11-26: 10 mg via ORAL
  Filled 2012-11-26: qty 1

## 2012-11-26 NOTE — ED Provider Notes (Signed)
CSN: [id number]     Arrival date & time 11/26/12  1726 History     First MD Initiated Contact with Patient 11/26/12 1846     Chief Complaint  Patient presents with  . Back Pain   (Consider location/radiation/quality/duration/timing/severity/associated sxs/prior Treatment) Patient is a 62 y.o. female presenting with back pain. The history is provided by the patient.  Back Pain Location:  Thoracic spine Quality:  Shooting and burning Radiates to:  Does not radiate Pain severity:  Severe Onset quality:  Gradual Duration:  2 days Timing:  Constant Progression:  Worsening Chronicity:  New Context: lifting heavy objects and physical stress   Relieved by:  Nothing Worsened by:  Movement, touching, bending and ambulation Ineffective treatments:  Bed rest Associated symptoms: no abdominal pain, no bladder incontinence, no bowel incontinence, no dysuria, no fever, no leg pain, no numbness and no weight loss    [place] is a 62 y.o. female who presents to the ED with right thoracic pain that started 2 days ago. She has not taken any pain medication. 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   Past Medical History  Diagnosis Date  . S/P colonoscopy 10/15/04    normal  . Kidney stones   . HTN (hypertension)   . Hypoglycemia   . Parkinson's disease   . Anxiety   . Depression   . COPD (chronic obstructive pulmonary disease)   . Asthma    Past Surgical History  Procedure Laterality Date  . Cholecystectomy    . Kidney stone surgery    . Colonoscopy  10/15/2004    Normal rectum/Normal colon  . Esophagogastroduodenoscopy  09/11/2010    Dysphagia likely multifactorial (possible Candida esophagitis, likely nonspecific esophageal motility disorder, and/or uncontrolled gastroesophageal reflux disease), status post empiric dilation   Family History  Problem Relation Age of Onset  . COPD Mother   . Coronary artery disease  Father   . Coronary artery disease Brother   . Coronary artery disease Sister    History  Substance Use Topics  . Smoking status: Never Smoker   . Smokeless tobacco: Not on file  . Alcohol Use: No   OB History   Grav Para Term Preterm Abortions TAB SAB Ect Mult Living                 Review of Systems  Constitutional: Negative for fever and weight loss.  HENT: Negative for neck pain.   Gastrointestinal: Negative for nausea, vomiting, abdominal pain and bowel incontinence.  Genitourinary: Negative for bladder incontinence, dysuria, urgency and frequency.  Musculoskeletal: Positive for back pain.  Skin: Negative for wound.  Neurological: Negative for syncope and numbness.  Psychiatric/Behavioral: The patient is not nervous/anxious.     Allergies  Penicillins  Home Medications   Current Outpatient Rx  Name  Route  Sig  Dispense  Refill  . diltiazem (CARDIZEM) 60 MG tablet   Oral   Take 60 mg by mouth 2 (two) times daily.         . furosemide (LASIX) 20 MG tablet   Oral   Take 20 mg by mouth daily.         Marland Kitchen lisinopril (PRINIVIL,ZESTRIL) 20 MG tablet   Oral   Take 20 mg by mouth daily.         Marland Kitchen omeprazole (PRILOSEC) 20 MG capsule   Oral   Take 20 mg by mouth daily.           Marland Kitchen  potassium chloride SA (K-DUR,KLOR-CON) 20 MEQ tablet   Oral   Take 20 mEq by mouth daily.          . primidone (MYSOLINE) 250 MG tablet   Oral   Take 250 mg by mouth 4 (four) times daily.           . rosuvastatin (CRESTOR) 10 MG tablet   Oral   Take 10 mg by mouth at bedtime.           BP 172/76  Pulse 53  Temp(Src) 97.5 F (36.4 C) (Oral)  Resp 20  Ht 5\' 6"  (1.676 m)  Wt 237 lb (107.502 kg)  BMI 38.27 kg/m2  SpO2 97% Physical Exam  Nursing note and vitals reviewed. Constitutional: She is oriented to person, place, and time. She appears well-developed and well-nourished. No distress.  HENT:  Head: Normocephalic.  Eyes: EOM are normal.  Neck: Neck supple.    Cardiovascular: Normal rate.   Pulmonary/Chest: Effort normal.  Musculoskeletal:       Thoracic back: She exhibits decreased range of motion, tenderness and spasm. She exhibits no bony tenderness and no deformity.       Back:  Radial pulse present, adequate circulation, good touch sensation. Good strength.  Neurological: She is alert and oriented to person, place, and time. She has normal strength and normal reflexes. No cranial nerve deficit or sensory deficit.  Problem with ambulation due to Parkinson's disease  Skin: Skin is warm and dry.  Psychiatric: She has a normal mood and affect. Her behavior is normal.    ED Course   Procedures  MDM  62 y.o. female with right  thoracic pain possible due to repetitive motion and straining at work. Discussed with the patient possible shingles when she complained of the burning sensation but no rash or lesions noted. Will treat as muscle strain and if other symptoms develop she will return.  Discussed with the patient and all questioned fully answered.    Medication List    TAKE these medications       cyclobenzaprine 10 MG tablet  Commonly known as:  FLEXERIL  Take 1 tablet (10 mg total) by mouth 2 (two) times daily as needed for muscle spasms.     HYDROcodone-acetaminophen 5-325 MG per tablet  Commonly known as:  NORCO/VICODIN  Take 1 tablet by mouth every 4 (four) hours as needed.      ASK your doctor about these medications       diltiazem 60 MG tablet  Commonly known as:  CARDIZEM  Take 60 mg by mouth 2 (two) times daily.     furosemide 20 MG tablet  Commonly known as:  LASIX  Take 20 mg by mouth daily.     lisinopril 20 MG tablet  Commonly known as:  PRINIVIL,ZESTRIL  Take 20 mg by mouth daily.     omeprazole 20 MG capsule  Commonly known as:  PRILOSEC  Take 20 mg by mouth daily.     potassium chloride SA 20 MEQ tablet  Commonly known as:  K-DUR,KLOR-CON  Take 20 mEq by mouth daily.     primidone 250 MG tablet   Commonly known as:  MYSOLINE  Take 250 mg by mouth 4 (four) times daily.     rosuvastatin 10 MG tablet  Commonly known as:  CRESTOR  Take 10 mg by mouth at bedtime.         Janne Napoleon, [place] 11/26/12 2256

## 2012-11-26 NOTE — ED Notes (Addendum)
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 Has had problems with her back in the past and this pain feels the same as with her previous back pain.

## 2012-11-28 NOTE — ED Provider Notes (Signed)
Medical screening examination/treatment/procedure(s) were performed by non-physician practitioner and as supervising physician I was immediately available for consultation/collaboration.   Laray Anger, DO 11/28/12 1308

## 2013-01-12 ENCOUNTER — Emergency Department (HOSPITAL_COMMUNITY)
Admission: EM | Admit: 2013-01-12 | Discharge: 2013-01-12 | Disposition: A | Discharge status: Home / Self Care | Payer: Medicaid Other | Attending: Emergency Medicine | Admitting: Emergency Medicine

## 2013-01-12 ENCOUNTER — Encounter (HOSPITAL_COMMUNITY): Payer: Self-pay | Admitting: *Deleted

## 2013-01-12 ENCOUNTER — Emergency Department (HOSPITAL_COMMUNITY): Payer: Medicaid Other

## 2013-01-12 DIAGNOSIS — Z8701 Personal history of pneumonia (recurrent): Secondary | ICD-10-CM | POA: Insufficient documentation

## 2013-01-12 DIAGNOSIS — Z87442 Personal history of urinary calculi: Secondary | ICD-10-CM | POA: Insufficient documentation

## 2013-01-12 DIAGNOSIS — J441 Chronic obstructive pulmonary disease with (acute) exacerbation: Secondary | ICD-10-CM | POA: Insufficient documentation

## 2013-01-12 DIAGNOSIS — J45901 Unspecified asthma with (acute) exacerbation: Secondary | ICD-10-CM | POA: Insufficient documentation

## 2013-01-12 DIAGNOSIS — I10 Essential (primary) hypertension: Secondary | ICD-10-CM | POA: Insufficient documentation

## 2013-01-12 DIAGNOSIS — J40 Bronchitis, not specified as acute or chronic: Secondary | ICD-10-CM

## 2013-01-12 DIAGNOSIS — Z880 Allergy status to penicillin: Secondary | ICD-10-CM | POA: Insufficient documentation

## 2013-01-12 DIAGNOSIS — G20A1 Parkinson's disease without dyskinesia, without mention of fluctuations: Secondary | ICD-10-CM | POA: Insufficient documentation

## 2013-01-12 DIAGNOSIS — Z79899 Other long term (current) drug therapy: Secondary | ICD-10-CM | POA: Insufficient documentation

## 2013-01-12 DIAGNOSIS — E669 Obesity, unspecified: Secondary | ICD-10-CM | POA: Insufficient documentation

## 2013-01-12 DIAGNOSIS — Z862 Personal history of diseases of the blood and blood-forming organs and certain disorders involving the immune mechanism: Secondary | ICD-10-CM | POA: Insufficient documentation

## 2013-01-12 DIAGNOSIS — Z8639 Personal history of other endocrine, nutritional and metabolic disease: Secondary | ICD-10-CM | POA: Insufficient documentation

## 2013-01-12 DIAGNOSIS — Z8659 Personal history of other mental and behavioral disorders: Secondary | ICD-10-CM | POA: Insufficient documentation

## 2013-01-12 DIAGNOSIS — G20 Parkinson's disease: Secondary | ICD-10-CM | POA: Insufficient documentation

## 2013-01-12 DIAGNOSIS — J45909 Unspecified asthma, uncomplicated: Secondary | ICD-10-CM

## 2013-01-12 HISTORY — DX: Pneumonia, unspecified organism: J18.9

## 2013-01-12 MED ORDER — AZITHROMYCIN 250 MG PO TABS: ORAL_TABLET | ORAL | Status: DC

## 2013-01-12 MED ORDER — ALBUTEROL SULFATE (5 MG/ML) 0.5% IN NEBU
5.0000 mg | INHALATION_SOLUTION | Freq: Once | RESPIRATORY_TRACT | Status: AC
  Administered 2013-01-12: 5 mg via RESPIRATORY_TRACT
  Filled 2013-01-12: qty 1

## 2013-01-12 MED ORDER — IPRATROPIUM BROMIDE 0.02 % IN SOLN
0.5000 mg | Freq: Once | RESPIRATORY_TRACT | Status: AC
  Administered 2013-01-12: 0.5 mg via RESPIRATORY_TRACT
  Filled 2013-01-12: qty 2.5

## 2013-01-12 MED ORDER — ALBUTEROL SULFATE HFA 108 (90 BASE) MCG/ACT IN AERS: 1.0000 | INHALATION_SPRAY | Freq: Four times a day (QID) | RESPIRATORY_TRACT | Status: DC | PRN

## 2013-01-12 MED ORDER — HYDROCODONE-HOMATROPINE 5-1.5 MG/5ML PO SYRP: 5.0000 mL | ORAL_SOLUTION | Freq: Four times a day (QID) | ORAL | Status: DC | PRN

## 2013-01-12 NOTE — ED Provider Notes (Signed)
CSN: [id number]     Arrival date & time 01/12/13  1650 History  This chart was scribed for Allison Hutching, MD by Shari Heritage, ED Scribe. The patient was seen in room APA01/APA01. Patient's care was started at 5:34 PM.     Chief Complaint  Patient presents with  . Shortness of Breath    The history is provided by the patient. No language interpreter was used.    HPI Comments: Allison Rollins is a 62 y.o. female with history of COPD, HTN, hypercholesterolemia, Parkinson's disease who presents to the Emergency Department complaining of intermittent, moderate productive cough onset 2 days. The cough is productive of yellow sputum with red tinges. There is associated wheezing, shortness of breath and subjective fever. She denies any other symptoms at this time. She is ambulatory. She does not smoke cigarettes. She [facility] [place].   Past Medical History  Diagnosis Date  . S/P colonoscopy 10/15/04    normal  . HTN (hypertension)   . Hypoglycemia   . Parkinson's disease   . Anxiety   . Depression   . COPD (chronic obstructive pulmonary disease)   . Asthma   . Kidney stones   . Pneumonia    Past Surgical History  Procedure Laterality Date  . Cholecystectomy    . Kidney stone surgery    . Colonoscopy  10/15/2004    Normal rectum/Normal colon  . Esophagogastroduodenoscopy  09/11/2010    Dysphagia likely multifactorial (possible Candida esophagitis, likely nonspecific esophageal motility disorder, and/or uncontrolled gastroesophageal reflux disease), status post empiric dilation   Family History  Problem Relation Age of Onset  . COPD Mother   . Coronary artery disease Father   . Coronary artery disease Brother   . Coronary artery disease Sister    History  Substance Use Topics  . Smoking status: Never Smoker   . Smokeless tobacco: Not on file  . Alcohol Use: No   OB History   Grav Para Term Preterm Abortions TAB SAB Ect Mult Living         Review of Systems A complete 10 system review of systems was obtained and all systems are negative except as noted in the HPI and PMH.   Allergies  Penicillins  Home Medications   Current Outpatient Rx  Name  Route  Sig  Dispense  Refill  . cyclobenzaprine (FLEXERIL) 10 MG tablet   Oral   Take 1 tablet (10 mg total) by mouth 2 (two) times daily as needed for muscle spasms.   20 tablet   0   . diltiazem (CARDIZEM) 60 MG tablet   Oral   Take 60 mg by mouth 2 (two) times daily.         . furosemide (LASIX) 20 MG tablet   Oral   Take 20 mg by mouth daily.         Marland Kitchen HYDROcodone-acetaminophen (NORCO/VICODIN) 5-325 MG per tablet   Oral   Take 1 tablet by mouth every 4 (four) hours as needed.   15 tablet   0   . lisinopril (PRINIVIL,ZESTRIL) 20 MG tablet   Oral   Take 20 mg by mouth daily.         Marland Kitchen omeprazole (PRILOSEC) 20 MG capsule   Oral   Take 20 mg by mouth daily.           . potassium chloride SA (K-DUR,KLOR-CON) 20 MEQ tablet   Oral   Take 20 mEq  by mouth daily.          . primidone (MYSOLINE) 250 MG tablet   Oral   Take 250 mg by mouth 4 (four) times daily.           . rosuvastatin (CRESTOR) 10 MG tablet   Oral   Take 10 mg by mouth at bedtime.           BP 130/52  Pulse 55  Temp(Src) 100 F (37.8 C) (Oral)  Resp 34  Ht 5\' 6"  (1.676 m)  Wt 248 lb (112.492 kg)  BMI 40.05 kg/m2  SpO2 95% Physical Exam  Nursing note and vitals reviewed. Constitutional: She is oriented to person, place, and time. She appears well-developed and well-nourished.  Obese.  HENT:  Head: Normocephalic and atraumatic.  Eyes: Conjunctivae and EOM are normal. Pupils are equal, round, and reactive to light.  Neck: Normal range of motion. Neck supple.  Cardiovascular: Normal rate, regular rhythm and normal heart sounds.   Pulmonary/Chest: Effort normal. She has wheezes (bilateral, expiratory).  Abdominal: Soft. Bowel sounds are normal.  Musculoskeletal:  Normal range of motion. She exhibits edema.  2+ peripheral edema.  Neurological: She is alert and oriented to person, place, and time.  Skin: Skin is warm and dry.  Psychiatric: She has a normal mood and affect.    ED Course  Procedures (including critical care time) DIAGNOSTIC STUDIES: Oxygen Saturation is 95% on room air, adequate by my interpretation.    COORDINATION OF CARE: 5:37 PM- Will order a CXR and breathing treatment. Patient informed of current plan for treatment and evaluation and agrees with plan at this time.   Labs Review Labs Reviewed - No data to display  Imaging Review Dg Chest 2 View  01/12/2013   CLINICAL DATA:  Wheezing and shortness of Breath  EXAM: CHEST  2 VIEW  COMPARISON:  07/01/2011  FINDINGS: The heart size and mediastinal contours are within normal limits. Both lungs are clear. The visualized skeletal structures are unremarkable.  IMPRESSION: No active cardiopulmonary disease.   Electronically Signed   By: Signa Kell M.D.   On: 01/12/2013 17:48    MDM  No diagnosis found. Chest x-ray shows no pneumonia.    Patient is hemodynamically stable.  Rx Zithromax, albuterol inhaler, Hycodan cough syrup  I personally performed the services described in this documentation, which was scribed in my presence. The recorded information has been reviewed and is accurate.    Allison Hutching, MD 01/12/13 [phone]

## 2013-01-12 NOTE — ED Notes (Signed)
RT called for treatment

## 2013-01-12 NOTE — ED Notes (Signed)
Cough, congestion yellow -red sputum.  Sl fever at home.

## 2013-03-13 ENCOUNTER — Other Ambulatory Visit (HOSPITAL_COMMUNITY): Payer: Self-pay | Admitting: Physician Assistant

## 2013-03-13 DIAGNOSIS — Z139 Encounter for screening, unspecified: Secondary | ICD-10-CM

## 2013-03-20 ENCOUNTER — Ambulatory Visit (HOSPITAL_COMMUNITY)
Admission: RE | Admit: 2013-03-20 | Discharge: 2013-03-20 | Disposition: A | Discharge status: Home / Self Care | Payer: Medicaid Other | Source: Ambulatory Visit | Attending: Physician Assistant | Admitting: Physician Assistant

## 2013-03-20 DIAGNOSIS — Z139 Encounter for screening, unspecified: Secondary | ICD-10-CM

## 2013-07-25 ENCOUNTER — Encounter (HOSPITAL_COMMUNITY): Payer: Self-pay | Admitting: Emergency Medicine

## 2013-07-25 ENCOUNTER — Emergency Department (HOSPITAL_COMMUNITY)
Admission: EM | Admit: 2013-07-25 | Discharge: 2013-07-25 | Disposition: A | Discharge status: Home / Self Care | Payer: Medicaid Other | Attending: Emergency Medicine | Admitting: Emergency Medicine

## 2013-07-25 DIAGNOSIS — Z87442 Personal history of urinary calculi: Secondary | ICD-10-CM | POA: Insufficient documentation

## 2013-07-25 DIAGNOSIS — Z8701 Personal history of pneumonia (recurrent): Secondary | ICD-10-CM | POA: Insufficient documentation

## 2013-07-25 DIAGNOSIS — R1110 Vomiting, unspecified: Secondary | ICD-10-CM

## 2013-07-25 DIAGNOSIS — R109 Unspecified abdominal pain: Secondary | ICD-10-CM | POA: Insufficient documentation

## 2013-07-25 DIAGNOSIS — R5383 Other fatigue: Secondary | ICD-10-CM

## 2013-07-25 DIAGNOSIS — Z880 Allergy status to penicillin: Secondary | ICD-10-CM | POA: Insufficient documentation

## 2013-07-25 DIAGNOSIS — R197 Diarrhea, unspecified: Secondary | ICD-10-CM | POA: Insufficient documentation

## 2013-07-25 DIAGNOSIS — F411 Generalized anxiety disorder: Secondary | ICD-10-CM | POA: Insufficient documentation

## 2013-07-25 DIAGNOSIS — I10 Essential (primary) hypertension: Secondary | ICD-10-CM | POA: Insufficient documentation

## 2013-07-25 DIAGNOSIS — J45901 Unspecified asthma with (acute) exacerbation: Secondary | ICD-10-CM | POA: Insufficient documentation

## 2013-07-25 DIAGNOSIS — J441 Chronic obstructive pulmonary disease with (acute) exacerbation: Secondary | ICD-10-CM | POA: Insufficient documentation

## 2013-07-25 DIAGNOSIS — R5381 Other malaise: Secondary | ICD-10-CM | POA: Insufficient documentation

## 2013-07-25 DIAGNOSIS — Z8659 Personal history of other mental and behavioral disorders: Secondary | ICD-10-CM | POA: Insufficient documentation

## 2013-07-25 DIAGNOSIS — Z7982 Long term (current) use of aspirin: Secondary | ICD-10-CM | POA: Insufficient documentation

## 2013-07-25 DIAGNOSIS — Z79899 Other long term (current) drug therapy: Secondary | ICD-10-CM | POA: Insufficient documentation

## 2013-07-25 DIAGNOSIS — Z8669 Personal history of other diseases of the nervous system and sense organs: Secondary | ICD-10-CM | POA: Insufficient documentation

## 2013-07-25 LAB — BASIC METABOLIC PANEL
BUN: 14 mg/dL (ref 6–23)
CO2: 29 mEq/L (ref 19–32)
Calcium: 9 mg/dL (ref 8.4–10.5)
Chloride: 101 mEq/L (ref 96–112)
Creatinine, Ser: 0.8 mg/dL (ref 0.50–1.10)
GFR calc Af Amer: 89 mL/min — ABNORMAL LOW (ref >90)
GFR calc non Af Amer: 77 mL/min — ABNORMAL LOW (ref >90)
Glucose, Bld: 125 mg/dL — ABNORMAL HIGH (ref 70–99)
Potassium: 3.4 mEq/L — ABNORMAL LOW (ref 3.7–5.3)
Sodium: 142 mEq/L (ref 137–147)

## 2013-07-25 LAB — CBC WITH DIFFERENTIAL/PLATELET
Basophils Absolute: 0 10*3/uL (ref 0.0–0.1)
Basophils Relative: 0 % (ref 0–1)
Eosinophils Absolute: 0 10*3/uL (ref 0.0–0.7)
Eosinophils Relative: 0 % (ref 0–5)
HCT: 43.2 % (ref 36.0–46.0)
Hemoglobin: 14.3 g/dL (ref 12.0–15.0)
Lymphocytes Relative: 4 % — ABNORMAL LOW (ref 12–46)
Lymphs Abs: 0.3 10*3/uL — ABNORMAL LOW (ref 0.7–4.0)
MCH: 29.7 pg (ref 26.0–34.0)
MCHC: 33.1 g/dL (ref 30.0–36.0)
MCV: 89.6 fL (ref 78.0–100.0)
Monocytes Absolute: 0.5 10*3/uL (ref 0.1–1.0)
Monocytes Relative: 6 % (ref 3–12)
Neutro Abs: 7.5 10*3/uL (ref 1.7–7.7)
Neutrophils Relative %: 90 % — ABNORMAL HIGH (ref 43–77)
Platelets: 250 10*3/uL (ref 150–400)
RBC: 4.82 MIL/uL (ref 3.87–5.11)
RDW: 13.8 % (ref 11.5–15.5)
WBC: 8.4 10*3/uL (ref 4.0–10.5)

## 2013-07-25 MED ORDER — ONDANSETRON HCL 8 MG PO TABS: 8.0000 mg | ORAL_TABLET | Freq: Three times a day (TID) | ORAL | Status: DC | PRN

## 2013-07-25 MED ORDER — ONDANSETRON HCL 4 MG/2ML IJ SOLN
4.0000 mg | Freq: Once | INTRAMUSCULAR | Status: AC
  Administered 2013-07-25: 4 mg via INTRAMUSCULAR

## 2013-07-25 MED ORDER — MORPHINE SULFATE 2 MG/ML IJ SOLN
2.0000 mg | Freq: Once | INTRAMUSCULAR | Status: AC
  Administered 2013-07-25: 2 mg via INTRAVENOUS
  Filled 2013-07-25: qty 1

## 2013-07-25 MED ORDER — ONDANSETRON HCL 4 MG/2ML IJ SOLN
4.0000 mg | Freq: Once | INTRAMUSCULAR | Status: AC
  Administered 2013-07-25: 4 mg via INTRAVENOUS
  Filled 2013-07-25: qty 2

## 2013-07-25 MED ORDER — ONDANSETRON HCL 4 MG/2ML IJ SOLN
INTRAMUSCULAR | Status: AC
  Administered 2013-07-25: 4 mg via INTRAMUSCULAR
  Filled 2013-07-25: qty 2

## 2013-07-25 MED ORDER — SODIUM CHLORIDE 0.9 % IV BOLUS (SEPSIS)
1000.0000 mL | Freq: Once | INTRAVENOUS | Status: AC
  Administered 2013-07-25: 1000 mL via INTRAVENOUS

## 2013-07-25 MED ORDER — ONDANSETRON HCL 4 MG/2ML IJ SOLN: 4.0000 mg | Freq: Once | INTRAMUSCULAR | Status: DC

## 2013-07-25 MED ORDER — ONDANSETRON HCL 4 MG/2ML IJ SOLN
4.0000 mg | Freq: Once | INTRAMUSCULAR | Status: DC
  Filled 2013-07-25: qty 2

## 2013-07-25 NOTE — ED Notes (Signed)
Pt given PO fluids per fluid challenge order.

## 2013-07-25 NOTE — ED Notes (Signed)
Pt reports wants something for pain and that "my stomach hurts when i drink anything." No active vomiting or dry heaves noted.EDP aware and reported would place an order for pain medication. Pt aware and verbalized understanding.

## 2013-07-25 NOTE — ED Notes (Addendum)
Vomiting with diarrhea since yesterday. Abdominal pain. States generalized weakness. Coughing since yesterday also. Discomfort to chest only with coughing and vomiting.

## 2013-07-25 NOTE — Care Management Note (Signed)
ED/CM noted patient did not have health insurance and/or PCP listed in the computer.  Patient was given the [place] resource handout with information on the clinics, food pantries, and the handout for new health insurance sign-up.  Patient expressed appreciation for information received. 

## 2013-07-25 NOTE — ED Provider Notes (Signed)
CSN: [id number]     Arrival date & time 07/25/13  1152 History   First MD Initiated Contact with Patient 07/25/13 1303     Chief Complaint  Patient presents with  . Emesis      Patient is a 63 y.o. female presenting with vomiting. The history is provided by the patient.  Emesis Severity:  Moderate Duration:  1 day Timing:  Intermittent Progression:  Worsening Chronicity:  New Relieved by:  Nothing Worsened by:  Liquids Associated symptoms: abdominal pain, cough and diarrhea   Associated symptoms: no fever   Risk factors: suspect food intake   Risk factors: no sick contacts and no travel to endemic areas   SHE REPORTS IT STARTED AFTER EATING AT [place]   Past Medical History  Diagnosis Date  . S/P colonoscopy 10/15/04    normal  . HTN (hypertension)   . Hypoglycemia   . Parkinson's disease   . Anxiety   . Depression   . COPD (chronic obstructive pulmonary disease)   . Asthma   . Kidney stones   . Pneumonia    Past Surgical History  Procedure Laterality Date  . Cholecystectomy    . Kidney stone surgery    . Colonoscopy  10/15/2004    Normal rectum/Normal colon  . Esophagogastroduodenoscopy  09/11/2010    Dysphagia likely multifactorial (possible Candida esophagitis, likely nonspecific esophageal motility disorder, and/or uncontrolled gastroesophageal reflux disease), status post empiric dilation   Family History  Problem Relation Age of Onset  . COPD Mother   . Coronary artery disease Father   . Coronary artery disease Brother   . Coronary artery disease Sister    History  Substance Use Topics  . Smoking status: Never Smoker   . Smokeless tobacco: Not on file  . Alcohol Use: No   OB History   Grav Para Term Preterm Abortions TAB SAB Ect Mult Living                 Review of Systems  Constitutional: Positive for fatigue. Negative for fever.  Respiratory: Positive for cough.   Gastrointestinal: Positive for vomiting, abdominal pain and diarrhea.  Negative for blood in stool.  Neurological: Positive for weakness.  All other systems reviewed and are negative.      Allergies  Penicillins  Home Medications   Current Outpatient Rx  Name  Route  Sig  Dispense  Refill  . aspirin EC 81 MG tablet   Oral   Take 81 mg by mouth daily.         . cyclobenzaprine (FLEXERIL) 10 MG tablet   Oral   Take 1 tablet (10 mg total) by mouth 2 (two) times daily as needed for muscle spasms.   20 tablet   0   . diltiazem (CARDIZEM) 60 MG tablet   Oral   Take 60 mg by mouth 2 (two) times daily.         . furosemide (LASIX) 20 MG tablet   Oral   Take 20 mg by mouth daily.         Marland Kitchen. gabapentin (NEURONTIN) 100 MG capsule   Oral   Take 100 mg by mouth 2 (two) times daily.         Marland Kitchen. lisinopril (PRINIVIL,ZESTRIL) 20 MG tablet   Oral   Take 20 mg by mouth daily.         Marland Kitchen. omeprazole (PRILOSEC) 20 MG capsule   Oral   Take 20 mg by mouth daily.           .Marland Kitchen  potassium chloride SA (K-DUR,KLOR-CON) 20 MEQ tablet   Oral   Take 20 mEq by mouth daily.          . primidone (MYSOLINE) 250 MG tablet   Oral   Take 250 mg by mouth 4 (four) times daily.           . rosuvastatin (CRESTOR) 10 MG tablet   Oral   Take 10 mg by mouth at bedtime.          Marland Kitchen albuterol (PROVENTIL HFA;VENTOLIN HFA) 108 (90 BASE) MCG/ACT inhaler   Inhalation   Inhale 1-2 puffs into the lungs every 6 (six) hours as needed for wheezing.   1 Inhaler   0    BP 137/67  Pulse 66  Temp(Src) 98.9 F (37.2 C) (Oral)  Resp 20  Ht 5\' 6"  (1.676 m)  Wt 248 lb (112.492 kg)  BMI 40.05 kg/m2  SpO2 95% Physical Exam CONSTITUTIONAL: Well developed/well nourished HEAD: Normocephalic/atraumatic EYES: EOMI/PERRL, no icterus ENMT: Mucous membranes dry NECK: supple no meningeal signs SPINE:entire spine nontender CV: S1/S2 noted, no murmurs/rubs/gallops noted LUNGS: Lungs are clear to auscultation bilaterally, no apparent distress ABDOMEN: soft, mild diffuse  tenderness but no rebound or guarding GU:no cva tenderness NEURO: Pt is awake/alert, moves all extremitiesx4 EXTREMITIES: pulses normal, full ROM SKIN: warm, color normal PSYCH: no abnormalities of mood noted  ED Course  Procedures  1:26 PM Pt here for nonbloody vomiting/diarrhea since yesterday Labs pending She refuses any pain meds at this time Suspect gastroenteritis 3:21 PM PT FEELING IMPROVED SHE IS NOT VOMITING LOW SUSPICION FOR ACUTE ABDOMINAL PROCESS SHE WILL NEED TO HOLD LASIX FOR TWO DAYS UNTIL DIARRHEA RESOLVES TO PREVENT DEHYDRATION WE DISCUSSED STRICT RETURN PRECAUTIONS  Labs Review Labs Reviewed  BASIC METABOLIC PANEL  CBC WITH DIFFERENTIAL    MDM   Final diagnoses:  Vomiting and diarrhea    Nursing notes including past medical history and social history reviewed and considered in documentation Labs/vital reviewed and considered     Joya Gaskins, MD 07/25/13 [phone]

## 2013-07-28 ENCOUNTER — Encounter (HOSPITAL_COMMUNITY): Payer: Self-pay | Admitting: Emergency Medicine

## 2013-07-28 ENCOUNTER — Observation Stay (HOSPITAL_COMMUNITY)
Admission: EM | Admit: 2013-07-28 | Discharge: 2013-07-30 | Disposition: A | Discharge status: Home / Self Care | Payer: Self-pay | Attending: Family Medicine | Admitting: Family Medicine

## 2013-07-28 ENCOUNTER — Emergency Department (HOSPITAL_COMMUNITY): Payer: Medicaid Other

## 2013-07-28 DIAGNOSIS — F3289 Other specified depressive episodes: Secondary | ICD-10-CM | POA: Insufficient documentation

## 2013-07-28 DIAGNOSIS — G20 Parkinson's disease: Secondary | ICD-10-CM | POA: Insufficient documentation

## 2013-07-28 DIAGNOSIS — J209 Acute bronchitis, unspecified: Secondary | ICD-10-CM

## 2013-07-28 DIAGNOSIS — E1159 Type 2 diabetes mellitus with other circulatory complications: Secondary | ICD-10-CM | POA: Diagnosis present

## 2013-07-28 DIAGNOSIS — E785 Hyperlipidemia, unspecified: Secondary | ICD-10-CM

## 2013-07-28 DIAGNOSIS — J449 Chronic obstructive pulmonary disease, unspecified: Secondary | ICD-10-CM | POA: Insufficient documentation

## 2013-07-28 DIAGNOSIS — Z87442 Personal history of urinary calculi: Secondary | ICD-10-CM | POA: Insufficient documentation

## 2013-07-28 DIAGNOSIS — R5383 Other fatigue: Secondary | ICD-10-CM | POA: Diagnosis not present

## 2013-07-28 DIAGNOSIS — D72829 Elevated white blood cell count, unspecified: Secondary | ICD-10-CM

## 2013-07-28 DIAGNOSIS — K5289 Other specified noninfective gastroenteritis and colitis: Principal | ICD-10-CM | POA: Insufficient documentation

## 2013-07-28 DIAGNOSIS — I152 Hypertension secondary to endocrine disorders: Secondary | ICD-10-CM | POA: Diagnosis present

## 2013-07-28 DIAGNOSIS — E876 Hypokalemia: Secondary | ICD-10-CM

## 2013-07-28 DIAGNOSIS — K529 Noninfective gastroenteritis and colitis, unspecified: Secondary | ICD-10-CM | POA: Diagnosis present

## 2013-07-28 DIAGNOSIS — F329 Major depressive disorder, single episode, unspecified: Secondary | ICD-10-CM | POA: Insufficient documentation

## 2013-07-28 DIAGNOSIS — I10 Essential (primary) hypertension: Secondary | ICD-10-CM | POA: Diagnosis present

## 2013-07-28 DIAGNOSIS — E119 Type 2 diabetes mellitus without complications: Secondary | ICD-10-CM | POA: Insufficient documentation

## 2013-07-28 DIAGNOSIS — R197 Diarrhea, unspecified: Secondary | ICD-10-CM | POA: Diagnosis not present

## 2013-07-28 DIAGNOSIS — Z7982 Long term (current) use of aspirin: Secondary | ICD-10-CM | POA: Insufficient documentation

## 2013-07-28 DIAGNOSIS — E669 Obesity, unspecified: Secondary | ICD-10-CM | POA: Diagnosis present

## 2013-07-28 DIAGNOSIS — R112 Nausea with vomiting, unspecified: Secondary | ICD-10-CM

## 2013-07-28 DIAGNOSIS — E860 Dehydration: Secondary | ICD-10-CM | POA: Diagnosis not present

## 2013-07-28 DIAGNOSIS — R5381 Other malaise: Secondary | ICD-10-CM | POA: Diagnosis not present

## 2013-07-28 DIAGNOSIS — F411 Generalized anxiety disorder: Secondary | ICD-10-CM | POA: Insufficient documentation

## 2013-07-28 DIAGNOSIS — K219 Gastro-esophageal reflux disease without esophagitis: Secondary | ICD-10-CM

## 2013-07-28 DIAGNOSIS — G20A1 Parkinson's disease without dyskinesia, without mention of fluctuations: Secondary | ICD-10-CM | POA: Insufficient documentation

## 2013-07-28 DIAGNOSIS — J4489 Other specified chronic obstructive pulmonary disease: Secondary | ICD-10-CM | POA: Insufficient documentation

## 2013-07-28 DIAGNOSIS — R739 Hyperglycemia, unspecified: Secondary | ICD-10-CM

## 2013-07-28 DIAGNOSIS — J45901 Unspecified asthma with (acute) exacerbation: Secondary | ICD-10-CM

## 2013-07-28 HISTORY — DX: Hypokalemia: E87.6

## 2013-07-28 LAB — CBC WITH DIFFERENTIAL/PLATELET
Basophils Absolute: 0 10*3/uL (ref 0.0–0.1)
Basophils Relative: 0 % (ref 0–1)
Eosinophils Absolute: 0 10*3/uL (ref 0.0–0.7)
Eosinophils Relative: 1 % (ref 0–5)
HCT: 40.9 % (ref 36.0–46.0)
Hemoglobin: 13.9 g/dL (ref 12.0–15.0)
Lymphocytes Relative: 19 % (ref 12–46)
Lymphs Abs: 0.9 10*3/uL (ref 0.7–4.0)
MCH: 29.3 pg (ref 26.0–34.0)
MCHC: 34 g/dL (ref 30.0–36.0)
MCV: 86.3 fL (ref 78.0–100.0)
Monocytes Absolute: 0.5 10*3/uL (ref 0.1–1.0)
Monocytes Relative: 10 % (ref 3–12)
Neutro Abs: 3.2 10*3/uL (ref 1.7–7.7)
Neutrophils Relative %: 70 % (ref 43–77)
Platelets: 237 10*3/uL (ref 150–400)
RBC: 4.74 MIL/uL (ref 3.87–5.11)
RDW: 13.6 % (ref 11.5–15.5)
WBC: 4.5 10*3/uL (ref 4.0–10.5)

## 2013-07-28 LAB — BASIC METABOLIC PANEL
BUN: 8 mg/dL (ref 6–23)
CO2: 25 mEq/L (ref 19–32)
Calcium: 8.7 mg/dL (ref 8.4–10.5)
Chloride: 103 mEq/L (ref 96–112)
Creatinine, Ser: 0.71 mg/dL (ref 0.50–1.10)
GFR calc Af Amer: >90 mL/min (ref >90)
GFR calc non Af Amer: 90 mL/min — ABNORMAL LOW (ref >90)
Glucose, Bld: 113 mg/dL — ABNORMAL HIGH (ref 70–99)
Potassium: 2.8 mEq/L — CL (ref 3.7–5.3)
Sodium: 140 mEq/L (ref 137–147)

## 2013-07-28 LAB — PHOSPHORUS: Phosphorus: 1.6 mg/dL — ABNORMAL LOW (ref 2.3–4.6)

## 2013-07-28 LAB — MAGNESIUM: Magnesium: 1.6 mg/dL (ref 1.5–2.5)

## 2013-07-28 LAB — I-STAT CG4 LACTIC ACID, ED: Lactic Acid, Venous: 2.26 mmol/L — ABNORMAL HIGH (ref 0.5–2.2)

## 2013-07-28 MED ORDER — SODIUM CHLORIDE 0.9 % IJ SOLN
3.0000 mL | Freq: Two times a day (BID) | INTRAMUSCULAR | Status: DC
  Administered 2013-07-29 – 2013-07-30 (×3): 3 mL via INTRAVENOUS

## 2013-07-28 MED ORDER — ASPIRIN EC 81 MG PO TBEC
81.0000 mg | DELAYED_RELEASE_TABLET | Freq: Every day | ORAL | Status: DC
  Administered 2013-07-29 – 2013-07-30 (×2): 81 mg via ORAL
  Filled 2013-07-28 (×2): qty 1

## 2013-07-28 MED ORDER — CYCLOBENZAPRINE HCL 10 MG PO TABS: 10.0000 mg | ORAL_TABLET | Freq: Two times a day (BID) | ORAL | Status: DC | PRN

## 2013-07-28 MED ORDER — ONDANSETRON HCL 4 MG PO TABS: 4.0000 mg | ORAL_TABLET | Freq: Four times a day (QID) | ORAL | Status: DC | PRN

## 2013-07-28 MED ORDER — POTASSIUM CHLORIDE CRYS ER 20 MEQ PO TBCR
40.0000 meq | EXTENDED_RELEASE_TABLET | Freq: Once | ORAL | Status: AC
  Administered 2013-07-28: 40 meq via ORAL
  Filled 2013-07-28: qty 2

## 2013-07-28 MED ORDER — POTASSIUM CHLORIDE 10 MEQ/100ML IV SOLN
10.0000 meq | INTRAVENOUS | Status: AC
  Administered 2013-07-28 (×2): 10 meq via INTRAVENOUS
  Filled 2013-07-28: qty 100

## 2013-07-28 MED ORDER — SODIUM CHLORIDE 0.9 % IV BOLUS (SEPSIS)
500.0000 mL | Freq: Once | INTRAVENOUS | Status: AC
  Administered 2013-07-28: 500 mL via INTRAVENOUS

## 2013-07-28 MED ORDER — ONDANSETRON HCL 4 MG/2ML IJ SOLN
4.0000 mg | Freq: Once | INTRAMUSCULAR | Status: AC
  Administered 2013-07-28: 4 mg via INTRAVENOUS
  Filled 2013-07-28: qty 2

## 2013-07-28 MED ORDER — ALBUTEROL SULFATE (2.5 MG/3ML) 0.083% IN NEBU: 3.0000 mL | INHALATION_SOLUTION | Freq: Four times a day (QID) | RESPIRATORY_TRACT | Status: DC | PRN

## 2013-07-28 MED ORDER — SODIUM CHLORIDE 0.9 % IV BOLUS (SEPSIS)
1000.0000 mL | Freq: Once | INTRAVENOUS | Status: AC
  Administered 2013-07-28: 1000 mL via INTRAVENOUS

## 2013-07-28 MED ORDER — PRIMIDONE 250 MG PO TABS
250.0000 mg | ORAL_TABLET | Freq: Four times a day (QID) | ORAL | Status: DC
  Filled 2013-07-28 (×7): qty 1

## 2013-07-28 MED ORDER — HEPARIN SODIUM (PORCINE) 5000 UNIT/ML IJ SOLN
5000.0000 [IU] | Freq: Three times a day (TID) | INTRAMUSCULAR | Status: DC
  Administered 2013-07-28 – 2013-07-29 (×3): 5000 [IU] via SUBCUTANEOUS
  Filled 2013-07-28 (×3): qty 1

## 2013-07-28 MED ORDER — DILTIAZEM HCL 60 MG PO TABS
60.0000 mg | ORAL_TABLET | Freq: Two times a day (BID) | ORAL | Status: DC
  Administered 2013-07-29: 60 mg via ORAL
  Filled 2013-07-28: qty 1

## 2013-07-28 MED ORDER — ONDANSETRON HCL 4 MG/2ML IJ SOLN: 4.0000 mg | Freq: Four times a day (QID) | INTRAMUSCULAR | Status: DC | PRN

## 2013-07-28 MED ORDER — KCL IN DEXTROSE-NACL 40-5-0.9 MEQ/L-%-% IV SOLN
INTRAVENOUS | Status: AC
  Filled 2013-07-28: qty 1000

## 2013-07-28 MED ORDER — GABAPENTIN 100 MG PO CAPS
100.0000 mg | ORAL_CAPSULE | Freq: Two times a day (BID) | ORAL | Status: DC
  Administered 2013-07-28 – 2013-07-30 (×4): 100 mg via ORAL
  Filled 2013-07-28 (×4): qty 1

## 2013-07-28 MED ORDER — POTASSIUM CHLORIDE 10 MEQ/100ML IV SOLN
10.0000 meq | Freq: Once | INTRAVENOUS | Status: AC
  Administered 2013-07-28: 10 meq via INTRAVENOUS
  Filled 2013-07-28: qty 100

## 2013-07-28 MED ORDER — POTASSIUM CHLORIDE CRYS ER 20 MEQ PO TBCR
20.0000 meq | EXTENDED_RELEASE_TABLET | Freq: Every day | ORAL | Status: DC
  Administered 2013-07-28 – 2013-07-29 (×2): 20 meq via ORAL
  Filled 2013-07-28: qty 1

## 2013-07-28 MED ORDER — ATORVASTATIN CALCIUM 10 MG PO TABS
10.0000 mg | ORAL_TABLET | Freq: Every day | ORAL | Status: DC
  Administered 2013-07-28 – 2013-07-29 (×2): 10 mg via ORAL
  Filled 2013-07-28 (×2): qty 1

## 2013-07-28 MED ORDER — KCL IN DEXTROSE-NACL 40-5-0.9 MEQ/L-%-% IV SOLN
INTRAVENOUS | Status: DC
  Administered 2013-07-28: 23:00:00 via INTRAVENOUS
  Filled 2013-07-28 (×5): qty 1000

## 2013-07-28 MED ORDER — LISINOPRIL 10 MG PO TABS
20.0000 mg | ORAL_TABLET | Freq: Every day | ORAL | Status: DC
  Administered 2013-07-28 – 2013-07-30 (×3): 20 mg via ORAL
  Filled 2013-07-28 (×3): qty 2

## 2013-07-28 NOTE — ED Notes (Signed)
Pt c/o diarrhea and generalized weakness since Wednesday. Pt was seen here on Wednesday for same symptoms but pt denies relief.

## 2013-07-28 NOTE — H&P (Signed)
[facility] History and Physical  Allison Rollins AVW:[id number] DOB: 06/14/1950    PCP:   Allison Rollins   Chief Complaint: diarrhea, nausea and vomiting for three days.  HPI: Allison Rollins is an 63 y.o. 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  She said she started to have yellow watery diarrhea after eating fish.  She has no fever, chills, distant travel or known ill contacts.  She hadn't been on any new medication nor was on antibiotic recently.  No abdominal pain, lightheadedness of syncope.  Evalatuion in the ER inlcuded a normal WBC and normal renal fx tests, but she has hypokalemia with a K of 2.8.  Her CXR showed nodularility that is not new, otherwise, no acute process.  She was given antiemetic, and 2 runs of 10 mEq/ L of KCL IV, and hospitalist was asked to admit her for further symptomatic tx.    Rewiew of Systems:  Constitutional: Negative for malaise, fever and chills. No significant weight loss or weight gain Eyes: Negative for eye pain, redness and discharge, diplopia, visual changes, or flashes of light. ENMT: Negative for ear pain, hoarseness, nasal congestion, sinus pressure and sore throat. No headaches; tinnitus, drooling, or problem swallowing. Cardiovascular: Negative for chest pain, palpitations, diaphoresis, dyspnea and peripheral edema. ; No orthopnea, PND Respiratory: Negative for cough, hemoptysis, wheezing and stridor. No pleuritic chestpain. Gastrointestinal: Negative for  constipation, abdominal pain, melena, blood in stool, hematemesis, jaundice and rectal bleeding.    Genitourinary: Negative for frequency, dysuria, incontinence,flank pain and hematuria; Musculoskeletal: Negative for back pain and neck pain. Negative for  swelling and trauma.;  Skin: . Negative for pruritus, rash, abrasions, bruising and skin lesion.; ulcerations Neuro: Negative for headache, lightheadedness and neck stiffness. Negative for weakness, altered level of consciousness , altered mental status, extremity weakness, burning feet, involuntary movement, seizure and syncope.  Psych: negative for anxiety, depression, insomnia, tearfulness, panic attacks, hallucinations, paranoia, suicidal or homicidal ideation.   Past Medical History  Diagnosis Date  . S/P colonoscopy 10/15/04    normal  . HTN (hypertension)   . Hypoglycemia   . Parkinson's disease   . Anxiety   . Depression   . COPD (chronic obstructive pulmonary disease)   . Asthma   . Kidney stones   . Pneumonia     Past Surgical History  Procedure Laterality Date  . Cholecystectomy    . Kidney stone surgery    . Colonoscopy  10/15/2004    Normal rectum/Normal colon  . Esophagogastroduodenoscopy  09/11/2010    Dysphagia likely multifactorial (possible Candida esophagitis, likely nonspecific esophageal motility disorder, and/or uncontrolled gastroesophageal reflux disease), status post empiric dilation    Medications:  HOME MEDS: Prior to Admission medications   Medication Sig Start Date End Date Taking? Authorizing Provider  albuterol (PROVENTIL HFA;VENTOLIN HFA) 108 (90 BASE) MCG/ACT inhaler Inhale 1-2 puffs into the lungs every 6 (six) hours as needed for wheezing. 01/12/13   Donnetta HutchingBrian Cook, MD  aspirin EC 81 MG tablet Take 81 mg by mouth daily.    Historical Provider, MD  cyclobenzaprine (FLEXERIL) 10 MG tablet Take 1 tablet (10 mg total) by mouth 2 (two) times daily as needed for muscle spasms. 11/26/12   Hope Orlene OchM Neese, NP  diltiazem (CARDIZEM) 60 MG tablet  Take 60 mg by mouth 2 (two) times daily.    Historical Provider, MD  furosemide (LASIX) 20 MG tablet Take 20 mg by mouth daily.    Historical Provider, MD  gabapentin (NEURONTIN) 100 MG capsule Take 100 mg by mouth 2  (two) times daily.    Historical Provider, MD  lisinopril (PRINIVIL,ZESTRIL) 20 MG tablet Take 20 mg by mouth daily.    Historical Provider, MD  omeprazole (PRILOSEC) 20 MG capsule Take 20 mg by mouth daily.      Historical Provider, MD  ondansetron (ZOFRAN) 8 MG tablet Take 1 tablet (8 mg total) by mouth every 8 (eight) hours as needed. 07/25/13   Joya Gaskins, MD  potassium chloride SA (K-DUR,KLOR-CON) 20 MEQ tablet Take 20 mEq by mouth daily.     Historical Provider, MD  primidone (MYSOLINE) 250 MG tablet Take 250 mg by mouth 4 (four) times daily.      Historical Provider, MD  rosuvastatin (CRESTOR) 10 MG tablet Take 10 mg by mouth at bedtime.     Historical Provider, MD     Allergies:  Allergies  Allergen Reactions  . Penicillins Itching and Rash    Social History:   reports that she has never smoked. She does not have any smokeless tobacco history on file. She reports that she does not drink alcohol or use illicit drugs.  Family History: Family History  Problem Relation Age of Onset  . COPD Mother   . Coronary artery disease Father   . Coronary artery disease Brother   . Coronary artery disease Sister      Physical Exam: Filed Vitals:   07/28/13 1549 07/28/13 1550 07/28/13 1600 07/28/13 1700  BP: 138/72 127/76 138/71 141/73  Pulse: 65 61 63 56  Temp:    98.2 F (36.8 C)  TempSrc:    Oral  Resp:   20 19  SpO2:   95% 96%   Blood pressure 141/73, pulse 56, temperature 98.2 F (36.8 C), temperature source Oral, resp. rate 19, SpO2 96.00%.  GEN:  Pleasant  patient lying in the stretcher in no acute distress; cooperative with exam. PSYCH:  alert and oriented x4; does not appear anxious or depressed; affect is appropriate. HEENT: Mucous membranes pink and anicteric; PERRLA; EOM intact; no cervical lymphadenopathy nor thyromegaly or carotid bruit; no JVD; There were no stridor. Neck is very supple. Breasts:: Not examined CHEST WALL: No tenderness CHEST: Normal  respiration, clear to auscultation bilaterally.  HEART: Regular rate and rhythm.  There are no murmur, rub, or gallops.   BACK: No kyphosis or scoliosis; no CVA tenderness ABDOMEN: soft and non-tender; no masses, no organomegaly, normal abdominal bowel sounds; no pannus; no intertriginous candida. There is no rebound and no distention. Rectal Exam: Not done EXTREMITIES: No bone or joint deformity; age-appropriate arthropathy of the hands and knees; no edema; no ulcerations.  There is no calf tenderness. Genitalia: not examined PULSES: 2+ and symmetric SKIN: Normal hydration no rash or ulceration CNS: Cranial nerves 2-12 grossly intact no focal lateralizing neurologic deficit.  Speech is fluent; uvula elevated with phonation, facial symmetry and tongue midline. DTR are normal bilaterally, cerebella exam is intact, barbinski is negative and strengths are equaled bilaterally.  No sensory loss.   Labs on Admission:  Basic Metabolic Panel:  Recent Labs Lab 07/25/13 1307 07/28/13 1615  NA 142 140  K 3.4* 2.8*  CL 101 103  CO2 29 25  GLUCOSE 125* 113*  BUN 14 8  CREATININE 0.80  0.71  CALCIUM 9.0 8.7   Liver Function Tests: No results found for this basename: AST, ALT, ALKPHOS, BILITOT, PROT, ALBUMIN,  in the last 168 hours No results found for this basename: LIPASE, AMYLASE,  in the last 168 hours No results found for this basename: AMMONIA,  in the last 168 hours CBC:  Recent Labs Lab 07/25/13 1307 07/28/13 1615  WBC 8.4 4.5  NEUTROABS 7.5 3.2  HGB 14.3 13.9  HCT 43.2 40.9  MCV 89.6 86.3  PLT 250 237     Radiological Exams on Admission: Dg Chest 2 View  07/28/2013   CLINICAL DATA:  Fever and cough  EXAM: CHEST  2 VIEW  COMPARISON:  January 12, 2013 and April 14, 2010 chest radiograph; April 14, 2010 chest CT  FINDINGS: There is no edema or consolidation. Heart is upper normal in size with normal pulmonary vascularity. On the lateral view, a nodular appearing opacity  is again noted behind the heart, stable. Note that prior chest CT examination did not show lesion corresponding to a mass in this area. This finding most likely represents vascular prominence.  No adenopathy.  No bone lesions.  IMPRESSION: Nodular appearing opacity on lateral view, previously evaluated. No edema or consolidation.   Electronically Signed   By: Bretta Bang M.D.   On: 07/28/2013 16:27    Assessment/Plan Present on Admission:  . Diarrhea . Benign hypertension . Obesity . Hypokalemia . Diarrhea in adult patient  PLAN:  Will admit her for diarrhea.  I think she has a viral gastroenteritis.  When she is not having oral intake, her diarrhea improves; this argues against secretory diarrhea.  She has no leukocytosis and no prior antibiotic use, making C diff very unlikely.  Her K is low, and will replete.  Will check Magnesium level also for same reason.  I will give her more IVF and KCL supplements.  Stools will be sent for studies.  She is stable, full code, and will be admitted to AP2 hospitalist service.  Thank you for allowing me to participate in her care.  Other plans as per orders.  Code Status: FULL Unk Lightning, MD. [facility] Pager [phone] 7pm to 7am.  07/28/2013, 6:38 PM

## 2013-07-28 NOTE — ED Provider Notes (Signed)
CSN: [id number]     Arrival date & time 07/28/13  1450 History  This chart was scribed for Allison Rollins W Griselda Bramblett, MD,  by Ashley JacobsBrittany Andrews, ED Scribe. The patient was seen in room APA19/APA19 and the patient's care was started at 3:11 PM.    First MD Initiated Contact with Patient 07/28/13 1506     Chief Complaint  Patient presents with  . Diarrhea  . Weakness      HPI HPI Comments: Allison Rollins is a 63 y.o. female who presents to the Emergency Department complaining of diarrhea and weakness onset of 5 days ago. Pt was seen at the ED 4 days ago for constant watery emesis. She reports the emesis has stopped but the day after she had constant frequent diarrhea and severe weakness. Pt explains that she has to wear a Depends diaper and she now has hemorrhoids. She constant feels "hot" and has to run a fan. Pt reports having increased bowel sounds. She has mild abdominal pain that is present only with coughing. Denies abdominal distension. Pt has a productive cough that consists of yellow sputum. Pt is only able to retain  soup and water. Denies difficulty voiding. Denies taking Lasix since last ED visit. 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  Past Medical History  Diagnosis Date  . S/P colonoscopy 10/15/04    normal  . HTN (hypertension)   . Hypoglycemia   . Parkinson's disease   . Anxiety   . Depression   . COPD (chronic obstructive pulmonary disease)   . Asthma   . Kidney stones   . Pneumonia    Past Surgical History  Procedure Laterality Date  . Cholecystectomy    . Kidney stone surgery    . Colonoscopy  10/15/2004    Normal rectum/Normal colon  . Esophagogastroduodenoscopy  09/11/2010    Dysphagia likely multifactorial (possible Candida esophagitis, likely nonspecific esophageal motility disorder, and/or uncontrolled gastroesophageal reflux disease), status post empiric dilation   Family History  Problem Relation Age of Onset  . COPD Mother   . Coronary  artery disease Father   . Coronary artery disease Brother   . Coronary artery disease Sister    History  Substance Use Topics  . Smoking status: Never Smoker   . Smokeless tobacco: Not on file  . Alcohol Use: No   OB History   Grav Para Term Preterm Abortions TAB SAB Ect Mult Living                 Review of Systems  Constitutional: Negative for fever.  Respiratory: Positive for cough.   Gastrointestinal: Positive for abdominal pain, diarrhea and rectal pain. Negative for nausea, vomiting, blood in stool and abdominal distention.  Genitourinary: Negative for difficulty urinating.  Neurological: Positive for weakness.  Hematological: Does not bruise/bleed easily.  All other systems reviewed and are negative.      Allergies  Penicillins  Home Medications   Current Outpatient Rx  Name  Route  Sig  Dispense  Refill  . albuterol (PROVENTIL HFA;VENTOLIN HFA) 108 (90 BASE) MCG/ACT inhaler   Inhalation   Inhale 1-2 puffs into the lungs every 6 (six) hours as needed for wheezing.   1 Inhaler   0   . aspirin EC 81 MG tablet   Oral   Take 81 mg by mouth daily.         . cyclobenzaprine (FLEXERIL) 10 MG tablet   Oral   Take 1 tablet (10 mg total)  by mouth 2 (two) times daily as needed for muscle spasms.   20 tablet   0   . diltiazem (CARDIZEM) 60 MG tablet   Oral   Take 60 mg by mouth 2 (two) times daily.         . furosemide (LASIX) 20 MG tablet   Oral   Take 20 mg by mouth daily.         Marland Kitchen gabapentin (NEURONTIN) 100 MG capsule   Oral   Take 100 mg by mouth 2 (two) times daily.         Marland Kitchen lisinopril (PRINIVIL,ZESTRIL) 20 MG tablet   Oral   Take 20 mg by mouth daily.         Marland Kitchen omeprazole (PRILOSEC) 20 MG capsule   Oral   Take 20 mg by mouth daily.           . ondansetron (ZOFRAN) 8 MG tablet   Oral   Take 1 tablet (8 mg total) by mouth every 8 (eight) hours as needed.   12 tablet   0   . potassium chloride SA (K-DUR,KLOR-CON) 20 MEQ tablet    Oral   Take 20 mEq by mouth daily.          . primidone (MYSOLINE) 250 MG tablet   Oral   Take 250 mg by mouth 4 (four) times daily.           . rosuvastatin (CRESTOR) 10 MG tablet   Oral   Take 10 mg by mouth at bedtime.           BP 132/67  Pulse 60  Temp(Src) 97.7 F (36.5 C) (Oral)  Resp 20  SpO2 100% Physical Exam CONSTITUTIONAL: Well developed/well nourished HEAD: Normocephalic/atraumatic EYES: EOMI/PERRL, no icterus ENMT: Mucous membranes dry NECK: supple no meningeal signs SPINE:entire spine nontender CV: S1/S2 noted, no murmurs/rubs/gallops noted LUNGS: Lungs are clear to auscultation bilaterally, no apparent distress ABDOMEN: soft, nontender, no rebound or guarding GU:no cva tenderness NEURO: Pt is awake/alert, moves all extremitiesx4 EXTREMITIES: pulses normal, full ROM SKIN: warm, color normal PSYCH: no abnormalities of mood noted  ED Course  Procedures  DIAGNOSTIC STUDIES: Oxygen Saturation is 100% on room air, normal by my interpretation.    COORDINATION OF CARE:  3:17 PM Discussed course of care with pt which includes IV and laboratory test. Pt understands and agrees.  5:23 PM Pt still reporting fatigue She has hypokalemia with EKG changes (prolonged qt, PVCs) Due to concern for worsening at home, will admit for rehydration andr replenish potassium 5:44 PM D/w dr Irene Limbo, will admit to tele   Labs Review Labs Reviewed  BASIC METABOLIC PANEL - Abnormal; Notable for the following:    Potassium 2.8 (*)    Glucose, Bld 113 (*)    GFR calc non Af Amer 90 (*)    All other components within normal limits  I-STAT CG4 LACTIC ACID, ED - Abnormal; Notable for the following:    Lactic Acid, Venous 2.26 (*)    All other components within normal limits  STOOL CULTURE  CLOSTRIDIUM DIFFICILE BY PCR  CBC WITH DIFFERENTIAL   Imaging Review Dg Chest 2 View  07/28/2013   CLINICAL DATA:  Fever and cough  EXAM: CHEST  2 VIEW  COMPARISON:  January 12, 2013 and April 14, 2010 chest radiograph; April 14, 2010 chest CT  FINDINGS: There is no edema or consolidation. Heart is upper normal in size with normal pulmonary vascularity. On the lateral view,  a nodular appearing opacity is again noted behind the heart, stable. Note that prior chest CT examination did not show lesion corresponding to a mass in this area. This finding most likely represents vascular prominence.  No adenopathy.  No bone lesions.  IMPRESSION: Nodular appearing opacity on lateral view, previously evaluated. No edema or consolidation.   Electronically Signed   By: Bretta Bang M.D.   On: 07/28/2013 16:27     Date: 07/28/2013 1702  Rate: 61  Rhythm: normal sinus rhythm  QRS Axis: normal  Intervals: QT prolonged  ST/T Wave abnormalities: nonspecific ST changes  Conduction Disutrbances:none     MDM   Final diagnoses:  Diarrhea  Fatigue  Hypokalemia  Dehydration    Nursing notes including past medical history and social history reviewed and considered in documentation xrays reviewed and considered Labs/vital reviewed and considered Previous records reviewed and considered - recent ED visit reviewed     I personally performed the services described in this documentation, which was scribed in my presence. The recorded information has been reviewed and is accurate.      Allison Gaskins, MD 07/28/13 [phone]

## 2013-07-28 NOTE — ED Notes (Signed)
Clear liquid tray ordered 

## 2013-07-29 DIAGNOSIS — E876 Hypokalemia: Secondary | ICD-10-CM

## 2013-07-29 DIAGNOSIS — K529 Noninfective gastroenteritis and colitis, unspecified: Secondary | ICD-10-CM | POA: Diagnosis present

## 2013-07-29 DIAGNOSIS — K5289 Other specified noninfective gastroenteritis and colitis: Principal | ICD-10-CM

## 2013-07-29 LAB — BASIC METABOLIC PANEL
BUN: 6 mg/dL (ref 6–23)
CO2: 27 mEq/L (ref 19–32)
Calcium: 8.3 mg/dL — ABNORMAL LOW (ref 8.4–10.5)
Chloride: 106 mEq/L (ref 96–112)
Creatinine, Ser: 0.7 mg/dL (ref 0.50–1.10)
GFR calc Af Amer: >90 mL/min (ref >90)
GFR calc non Af Amer: >90 mL/min (ref >90)
Glucose, Bld: 106 mg/dL — ABNORMAL HIGH (ref 70–99)
Potassium: 3.4 mEq/L — ABNORMAL LOW (ref 3.7–5.3)
Sodium: 142 mEq/L (ref 137–147)

## 2013-07-29 LAB — TSH: TSH: 1.29 u[IU]/mL (ref 0.350–4.500)

## 2013-07-29 LAB — CLOSTRIDIUM DIFFICILE BY PCR: Toxigenic C. Difficile by PCR: NEGATIVE

## 2013-07-29 MED ORDER — DILTIAZEM HCL 60 MG PO TABS
60.0000 mg | ORAL_TABLET | Freq: Two times a day (BID) | ORAL | Status: DC
  Filled 2013-07-29: qty 1

## 2013-07-29 MED ORDER — K PHOS MONO-SOD PHOS DI & MONO 155-852-130 MG PO TABS
500.0000 mg | ORAL_TABLET | Freq: Three times a day (TID) | ORAL | Status: AC
  Administered 2013-07-29 (×3): 500 mg via ORAL
  Filled 2013-07-29 (×3): qty 2

## 2013-07-29 MED ORDER — POTASSIUM PHOSPHATE MONOBASIC 500 MG PO TABS
500.0000 mg | ORAL_TABLET | Freq: Three times a day (TID) | ORAL | Status: DC
  Filled 2013-07-29 (×3): qty 1

## 2013-07-29 NOTE — Progress Notes (Signed)
  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 No recent antibiotics. No recent travel. She was admitted for supportive care and repletion of hypokalemia.  Assessment/Plan: 1. Nausea, vomiting, diarrhea. Suspected viral gastroenteritis. Improved with resolution of nausea and vomiting. 2. Hypokalemia. Improved with repletion. 3. Hypophosphatemia. 4. History of hypertension, Parkinson's disease, asthma, COPD   Overall improving. C. difficile negative. Advance diet. Replete potassium and phosphate.  Basic metabolic panel in the morning.  Likely home 4/6.  Code Status: full code DVT prophylaxis: heparin Family Communication: discussed with husband at bedside Disposition Plan: home  Brendia Sacksaniel Alauna Hayden, MD  [facility]  Pager [phone] If 7PM-7AM, please contact night-coverage at [email or web address], password [email or web address] 07/29/2013, 12:35 PM  LOS: 1 day   Consultants:    Procedures:    Antibiotics:    HPI/Subjective: Feels better. No nausea or vomiting.Laurell Roof. Hungry. Wants a solid diet. Still some diarrhea. No abdominal pain.  Objective: Filed Vitals:   07/28/13 1700 07/28/13 1900 07/28/13 2002 07/29/13 0401  BP: 141/73 162/90 154/71 126/70  Pulse: 56 72 59 51  Temp: 98.2 F (36.8 C) 98 F (36.7 C) 98.2 F (36.8 C) 98.5 F (36.9 C)  TempSrc: Oral Oral Oral Oral  Resp: 19 19 20 20   Height:   5\' 7"  (1.702 m)   Weight:   159.3 kg (351 lb 3.1 oz)   SpO2: 96% 95% 96% 97%    Intake/Output Summary (Last 24 hours) at 07/29/13 1235 Last data filed at 07/29/13 1229  Gross per 24 hour  Intake      0 ml  Output    850 ml  Net   -850 ml     Filed Weights   07/28/13 2002  Weight: 159.3 kg (351 lb 3.1 oz)    Exam:   Afebrile, vital signs stable.  Gen. Appears calm and comfortable. Speech fluent  and clear.  Cardiovascular regular rate and rhythm. No murmur, rub or gallop. No lower remedy edema.  Telemetry sinus bradycardia.  Respiratory clear to auscultation bilaterally. No wheezes, rales or rhonchi. Normal respiratory effort.  Abdomen soft nontender nondistended.  Psychiatric grossly normal mood and affect. Speech fluent and appropriate.  Data Reviewed:  Potassium 3.4. Basic metabolic panel unremarkable. Magnesium normal yesterday.  Phosphorus low 1.6 on admission.  C. difficile PCR negative.  Scheduled Meds: . aspirin EC  81 mg Oral Daily  . atorvastatin  10 mg Oral q1800  . diltiazem  60 mg Oral BID  . gabapentin  100 mg Oral BID  . heparin  5,000 Units Subcutaneous 3 times per day  . lisinopril  20 mg Oral Daily  . potassium chloride SA  20 mEq Oral Daily  . primidone  250 mg Oral QID  . sodium chloride  3 mL Intravenous Q12H   Continuous Infusions: . dextrose 5 % and 0.9 % NaCl with KCl 40 mEq/L 100 mL/hr at 07/28/13 2301    Principal Problem:   Acute gastroenteritis Active Problems:   Obesity   Benign hypertension   Diarrhea   Nausea and vomiting in adult   Hypokalemia   Diarrhea in adult patient   Time spent 15 minutes

## 2013-07-30 LAB — BASIC METABOLIC PANEL
BUN: 4 mg/dL — ABNORMAL LOW (ref 6–23)
CO2: 28 mEq/L (ref 19–32)
Calcium: 8.5 mg/dL (ref 8.4–10.5)
Chloride: 104 mEq/L (ref 96–112)
Creatinine, Ser: 0.71 mg/dL (ref 0.50–1.10)
GFR calc Af Amer: >90 mL/min (ref >90)
GFR calc non Af Amer: 90 mL/min — ABNORMAL LOW (ref >90)
Glucose, Bld: 109 mg/dL — ABNORMAL HIGH (ref 70–99)
Potassium: 3.1 mEq/L — ABNORMAL LOW (ref 3.7–5.3)
Sodium: 143 mEq/L (ref 137–147)

## 2013-07-30 LAB — MAGNESIUM: Magnesium: 1.5 mg/dL (ref 1.5–2.5)

## 2013-07-30 LAB — GLUCOSE, CAPILLARY
Glucose-Capillary: 101 mg/dL — ABNORMAL HIGH (ref 70–99)
Glucose-Capillary: 89 mg/dL (ref 70–99)

## 2013-07-30 LAB — PHOSPHORUS: Phosphorus: 3 mg/dL (ref 2.3–4.6)

## 2013-07-30 MED ORDER — POTASSIUM CHLORIDE CRYS ER 20 MEQ PO TBCR: EXTENDED_RELEASE_TABLET | ORAL | Status: DC

## 2013-07-30 MED ORDER — FUROSEMIDE 20 MG PO TABS: ORAL_TABLET | ORAL | Status: DC

## 2013-07-30 MED ORDER — POTASSIUM CHLORIDE CRYS ER 20 MEQ PO TBCR
40.0000 meq | EXTENDED_RELEASE_TABLET | ORAL | Status: DC
  Administered 2013-07-30: 40 meq via ORAL
  Filled 2013-07-30: qty 2

## 2013-07-30 NOTE — Discharge Instructions (Signed)
Gastritis, Adult  Gastritis is soreness and puffiness (inflammation) of the lining of the stomach. If you do not get help, gastritis can cause bleeding and sores (ulcers) in the stomach.  HOME CARE   · Only take medicine as told by your doctor.  · If you were given antibiotic medicines, take them as told. Finish the medicines even if you start to feel better.  · Drink enough fluids to keep your pee (urine) clear or pale yellow.  · Avoid foods and drinks that make your problems worse. Foods you may want to avoid include:  · Caffeine or alcohol.  · Chocolate.  · Mint.  · Garlic and onions.  · Spicy foods.  · Citrus fruits, including oranges, lemons, or limes.  · Food containing tomatoes, including sauce, chili, salsa, and pizza.  · Fried and fatty foods.  · Eat small meals throughout the day instead of large meals.  GET HELP RIGHT AWAY IF:   · You have black or dark red poop (stools).  · You throw up (vomit) blood. It may look like coffee grounds.  · You cannot keep fluids down.  · Your belly (abdominal) pain gets worse.  · You have a fever.  · You do not feel better after 1 week.  · You have any other questions or concerns.  MAKE SURE YOU:   · Understand these instructions.  · Will watch your condition.  · Will get help right away if you are not doing well or get worse.  Document Released: 09/29/2007 Document Revised: 07/05/2011 Document Reviewed: 05/26/2011  Exit[employer]® Patient Information ©2014 [employer].

## 2013-07-30 NOTE — Discharge Summary (Signed)
Physician Discharge Summary  Allison Rollins:[id number] DOB: 1951-01-09 DOA: 07/28/2013  PCP: Willow Ora, PA-C  Admit date: 07/28/2013 Discharge date: 07/30/2013  Time spent: 40 minutes  Recommendations for Outpatient Follow-up:  1. PCP 1 week for evaluation of gastroenteritis. Recommend BMET to evaluate potassium level. Follow stool culture 2. Recommending patient continue bland diet on discharge and advance as tolerated Discharge Diagnoses:  Principal Problem:   Acute gastroenteritis Active Problems:   Obesity   Benign hypertension   Diarrhea   Nausea and vomiting in adult   Hypokalemia   Diarrhea in adult patient   Discharge Condition: stable  Diet recommendation: bland and advance as tolerated  Filed Weights   07/28/13 2002  Weight: 159.3 kg (351 lb 3.1 oz)    History of present illness:  Allison Rollins is an 63 y.o. 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 She said she started to have yellow watery diarrhea after eating fish. She had no fever, chills, distant travel or known ill contacts. She hadn't been on any new medication nor was on antibiotic recently. No abdominal pain, lightheadedness of syncope. Evalatuion in the ER inlcuded a normal WBC and normal renal fx tests, but she had hypokalemia with a K of 2.8. Her CXR showed nodularility that is not new, otherwise, no acute process. She was given antiemetic, and 2 runs of 10 mEq/ L of KCL IV, and hospitalist was asked to admit her for further symptomatic tx.   Hospital Course:  1. Nausea, vomiting, diarrhea. Suspected viral gastroenteritis. C. Difficile negative. Stool culture negative.  Provided with bowel rest, clear liquids and anti-emetics. Quickly resolved. At discharge  resolution of nausea and vomiting. 2. Hypokalemia. Improved with repletion. At discharge potassium level 3.1. Was provided oral supplement x3 on day of discharge. Home medications include daily. She is also on lasix. Recommend BMEt in 1 week to evaluate potassium levle 3. Hypophosphatemia. Repleted. At discharge phosphate level within the limits of normal.  4. History of hypertension, Parkinson's disease, asthma, COPD. All remained stable at baseline during this hospitalization   Procedures:  none  Consultations:  none  Discharge Exam: Filed Vitals:   07/30/13 0424  BP: 140/72  Pulse: 55  Temp: 98.4 F (36.9 C)  Resp: 20    General: obese ambulating in room with steady gait Cardiovascular: rrr, no m/g/r no LE edema Respiratory: normal effort BS clear bilaterally no wheeze no rhonchi Abdomen: obese, soft +BS x4. Non-tender to palpation.   Discharge Instructions You were cared for by a hospitalist during your hospital stay. If you have any questions about your discharge medications or the care you received while you were in the hospital after you are discharged, you can call the unit and asked to speak with the hospitalist on call if the hospitalist that took care of you is not available. Once you are discharged, your primary care physician will handle any further medical issues. Please note that NO REFILLS for any discharge medications will be authorized once you are discharged, as it is imperative that you return to your primary care physician (or establish a relationship with a primary care physician if you do not have one) for your aftercare needs so that they can reassess your need for medications and monitor  your lab values.      Discharge Orders   Future Orders Complete By Expires   Diet - low sodium heart healthy  As directed    Discharge instructions  As directed    Comments:     Take medications as directed. Hold lasix 07/31/13 and resume 08/01/13. Advance diet  slowly Follow up with PCP 1 week for evaluation of gastroenteritis   Increase activity slowly  As directed        Medication List         acetaminophen 325 MG tablet  Commonly known as:  TYLENOL  Take 650 mg by mouth 2 (two) times daily as needed for headache.     albuterol 108 (90 BASE) MCG/ACT inhaler  Commonly known as:  PROVENTIL HFA;VENTOLIN HFA  Inhale 1-2 puffs into the lungs every 6 (six) hours as needed for wheezing.     aspirin EC 81 MG tablet  Take 81 mg by mouth daily.     diltiazem 60 MG tablet  Commonly known as:  CARDIZEM  Take 60 mg by mouth 2 (two) times daily.     FISH OIL PO  Take 1 capsule by mouth 4 (four) times daily.     furosemide 20 MG tablet  Commonly known as:  LASIX  Take 1 tab daily starting 08/01/13.     gabapentin 100 MG capsule  Commonly known as:  NEURONTIN  Take 100 mg by mouth 2 (two) times daily.     lisinopril 20 MG tablet  Commonly known as:  PRINIVIL,ZESTRIL  Take 20 mg by mouth daily.     omeprazole 20 MG capsule  Commonly known as:  PRILOSEC  Take 20 mg by mouth at bedtime.     potassium chloride SA 20 MEQ tablet  Commonly known as:  K-DUR,KLOR-CON  Take 1 tab daily starting 07/31/13     rosuvastatin 10 MG tablet  Commonly known as:  CRESTOR  Take 10 mg by mouth at bedtime.       Allergies  Allergen Reactions  . Penicillins Itching and Rash   Follow-up Information   Follow up with Willow OraMcElroy, Shannon G, PA-C. Schedule an appointment as soon as possible for a visit in 1 week. (for evaluation of gastroenteritis. may want BMET to track potassium level. )    Specialty:  Physician Assistant   Contact information:   [facility] [address] [place] [address] [phone]        The results of significant diagnostics from this hospitalization (including imaging, microbiology, ancillary and laboratory) are listed below for reference.    Significant Diagnostic Studies: Dg Chest 2  View  07/28/2013   CLINICAL DATA:  Fever and cough  EXAM: CHEST  2 VIEW  COMPARISON:  January 12, 2013 and April 14, 2010 chest radiograph; April 14, 2010 chest CT  FINDINGS: There is no edema or consolidation. Heart is upper normal in size with normal pulmonary vascularity. On the lateral view, a nodular appearing opacity is again noted behind the heart, stable. Note that prior chest CT examination did not show lesion corresponding to a mass in this area. This finding most likely represents vascular prominence.  No adenopathy.  No bone lesions.  IMPRESSION: Nodular appearing opacity on lateral view, previously evaluated. No edema or consolidation.   Electronically Signed   By: Bretta BangWilliam  Woodruff M.D.   On: 07/28/2013 16:27    Microbiology: Recent Results (from the past 240 hour(s))  CLOSTRIDIUM DIFFICILE BY  PCR     Status: None   Collection Time    07/29/13  1:15 AM      Result Value Ref Range Status   C difficile by pcr NEGATIVE  NEGATIVE Final     Labs: Basic Metabolic Panel:  Recent Labs Lab 07/25/13 1307 07/28/13 1615 07/29/13 0612 07/30/13 0610 07/30/13 0615  NA 142 140 142  --  143  K 3.4* 2.8* 3.4*  --  3.1*  CL 101 103 106  --  104  CO2 29 25 27   --  28  GLUCOSE 125* 113* 106*  --  109*  BUN 14 8 6   --  4*  CREATININE 0.80 0.71 0.70  --  0.71  CALCIUM 9.0 8.7 8.3*  --  8.5  MG  --  1.6  --  1.5  --   PHOS  --  1.6*  --  3.0  --    Liver Function Tests: No results found for this basename: AST, ALT, ALKPHOS, BILITOT, PROT, ALBUMIN,  in the last 168 hours No results found for this basename: LIPASE, AMYLASE,  in the last 168 hours No results found for this basename: AMMONIA,  in the last 168 hours CBC:  Recent Labs Lab 07/25/13 1307 07/28/13 1615  WBC 8.4 4.5  NEUTROABS 7.5 3.2  HGB 14.3 13.9  HCT 43.2 40.9  MCV 89.6 86.3  PLT 250 237   Cardiac Enzymes: No results found for this basename: CKTOTAL, CKMB, CKMBINDEX, TROPONINI,  in the last 168  hours BNP: BNP (last 3 results) No results found for this basename: PROBNP,  in the last 8760 hours CBG:  Recent Labs Lab 07/30/13 0746  GLUCAP 101*       Signed:  BLACK,KAREN M  [facility] 07/30/2013, 11:07 AM

## 2013-07-30 NOTE — Progress Notes (Signed)
[id number] Patient left floor in stable condition via w/c accompanied by nursing staff. Discharged home. Earnstine RegalAshley Lelani Garnett, RN

## 2013-07-30 NOTE — Discharge Summary (Signed)
Patient seen, independently examined and chart reviewed. I agree with exam, assessment and plan discussed with Toya SmothersKaren Black, NP.  Subjective: Feels much better today. No nausea, vomiting, abdominal pain or diarrhea. Tolerating diet. Wants to go home.  Objective: Afebrile, vital signs stable. Appears calm and comfortable. Cardiovascular regular rate and rhythm. Respiratory clear to auscultation bilaterally. No wheezes, rales or rhonchi. Normal respiratory effort. Abdomen soft nontender and nondistended. No lower extremity edema.  Potassium 3.1. Basic metabolic panel unremarkable. Normal phosphorus and magnesium.  Much improved today, presumed viral gastroenteritis appears resolved. Discharge home.  Brendia Sacksaniel Afton Lavalle, MD [facility] [phone]

## 2013-07-30 NOTE — Progress Notes (Signed)
07/30/13 1156 Reviewed discharge instructions with patients. AVS given. Pt states has lasix and potassium at home, does not need new prescriptions, Toya SmothersKaren Black, NP aware. Reviewed gastroenteritis education, s/s, when to call MD. Verbalized understanding of instructions via teach-back. IV site d/c'd within normal limits. Attempted to schedule f/u appointment x 2, [facility] Pt states will call to schedule appointment as instructed. Pt in stable condition awaiting discharge home, prefers to eat lunch prior to discharge. Earnstine RegalAshley Dannah Ryles, RN

## 2013-08-02 LAB — STOOL CULTURE: Special Requests: NORMAL

## 2013-12-20 ENCOUNTER — Ambulatory Visit (HOSPITAL_COMMUNITY)
Admission: RE | Admit: 2013-12-20 | Discharge: 2013-12-20 | Disposition: A | Discharge status: Home / Self Care | Payer: Medicaid Other | Source: Ambulatory Visit | Attending: Physician Assistant | Admitting: Physician Assistant

## 2013-12-20 DIAGNOSIS — I059 Rheumatic mitral valve disease, unspecified: Secondary | ICD-10-CM | POA: Insufficient documentation

## 2013-12-20 DIAGNOSIS — R609 Edema, unspecified: Secondary | ICD-10-CM | POA: Diagnosis present

## 2013-12-20 DIAGNOSIS — J449 Chronic obstructive pulmonary disease, unspecified: Secondary | ICD-10-CM | POA: Diagnosis not present

## 2013-12-20 DIAGNOSIS — E669 Obesity, unspecified: Secondary | ICD-10-CM | POA: Insufficient documentation

## 2013-12-20 DIAGNOSIS — I10 Essential (primary) hypertension: Secondary | ICD-10-CM | POA: Insufficient documentation

## 2013-12-20 DIAGNOSIS — E119 Type 2 diabetes mellitus without complications: Secondary | ICD-10-CM | POA: Insufficient documentation

## 2013-12-20 DIAGNOSIS — G20 Parkinson's disease: Secondary | ICD-10-CM | POA: Insufficient documentation

## 2013-12-20 DIAGNOSIS — G20A1 Parkinson's disease without dyskinesia, without mention of fluctuations: Secondary | ICD-10-CM | POA: Insufficient documentation

## 2013-12-20 DIAGNOSIS — J4489 Other specified chronic obstructive pulmonary disease: Secondary | ICD-10-CM | POA: Insufficient documentation

## 2013-12-20 NOTE — Progress Notes (Signed)
  Echocardiogram 2D Echocardiogram has been performed.  Allison Rollins 12/20/2013, 12:20 PM

## 2014-01-02 ENCOUNTER — Ambulatory Visit (INDEPENDENT_AMBULATORY_CARE_PROVIDER_SITE_OTHER): Payer: Medicaid Other | Admitting: Cardiology

## 2014-01-02 ENCOUNTER — Encounter: Payer: Self-pay | Admitting: Cardiology

## 2014-01-02 VITALS — BP 100/70 | HR 50 | Ht 67.0 in | Wt 236.0 lb

## 2014-01-02 DIAGNOSIS — I498 Other specified cardiac arrhythmias: Secondary | ICD-10-CM

## 2014-01-02 DIAGNOSIS — R001 Bradycardia, unspecified: Secondary | ICD-10-CM

## 2014-01-02 DIAGNOSIS — I10 Essential (primary) hypertension: Secondary | ICD-10-CM

## 2014-01-02 DIAGNOSIS — I5032 Chronic diastolic (congestive) heart failure: Secondary | ICD-10-CM

## 2014-01-02 NOTE — Progress Notes (Signed)
Clinical Summary Ms. Beever is a 63 y.o.female seen today as a new patient for the following medical problems.  1. Chronic diastolic heart failure - +LE for approx 10 years which is overall stable - reports recent 130 lbs weight loss - notes DOE at <1 block which is stable. +orthopnea, uses 2 pillow chronically. .  - only taking lasix once a day, tired of going to bathroom all day.   2. HTN - compliant with meds  3. COPD - compliant with inhalers - no longer smoking  4. Bradycardia - she is on 2 AV nodal agents with Toprol XL and dilt - denies any lightheadedness   5. Hyperlipidemia - compliant with statin  Past Medical History  Diagnosis Date  . S/P colonoscopy 10/15/04    normal  . HTN (hypertension)   . Hypoglycemia   . Parkinson's disease   . Anxiety   . Depression   . COPD (chronic obstructive pulmonary disease)   . Asthma   . Kidney stones   . Pneumonia      Allergies  Allergen Reactions  . Penicillins Itching and Rash     Current Outpatient Prescriptions  Medication Sig Dispense Refill  . acetaminophen (TYLENOL) 325 MG tablet Take 650 mg by mouth 2 (two) times daily as needed for headache.      . albuterol (PROVENTIL HFA;VENTOLIN HFA) 108 (90 BASE) MCG/ACT inhaler Inhale 1-2 puffs into the lungs every 6 (six) hours as needed for wheezing.  1 Inhaler  0  . aspirin EC 81 MG tablet Take 81 mg by mouth daily.      Marland Kitchen diltiazem (CARDIZEM) 60 MG tablet Take 60 mg by mouth 2 (two) times daily.      . furosemide (LASIX) 20 MG tablet Take 1 tab daily starting 08/01/13.  30 tablet  0  . gabapentin (NEURONTIN) 100 MG capsule Take 100 mg by mouth 2 (two) times daily.      Marland Kitchen lisinopril (PRINIVIL,ZESTRIL) 20 MG tablet Take 20 mg by mouth daily.      . Omega-3 Fatty Acids (FISH OIL PO) Take 1 capsule by mouth 4 (four) times daily.      Marland Kitchen omeprazole (PRILOSEC) 20 MG capsule Take 20 mg by mouth at bedtime.       . potassium chloride SA (K-DUR,KLOR-CON) 20 MEQ tablet  Take 1 tab daily starting 07/31/13  20 tablet  0  . rosuvastatin (CRESTOR) 10 MG tablet Take 10 mg by mouth at bedtime.        No current facility-administered medications for this visit.     Past Surgical History  Procedure Laterality Date  . Cholecystectomy    . Kidney stone surgery    . Colonoscopy  10/15/2004    Normal rectum/Normal colon  . Esophagogastroduodenoscopy  09/11/2010    Dysphagia likely multifactorial (possible Candida esophagitis, likely nonspecific esophageal motility disorder, and/or uncontrolled gastroesophageal reflux disease), status post empiric dilation     Allergies  Allergen Reactions  . Penicillins Itching and Rash      Family History  Problem Relation Age of Onset  . COPD Mother   . Coronary artery disease Father   . Coronary artery disease Brother   . Coronary artery disease Sister      Social History Ms. Graig reports that she has never smoked. She does not have any smokeless tobacco history on file. Ms. Bilyeu reports that she does not drink alcohol.   Review of Systems CONSTITUTIONAL: No weight loss, fever,  chills, weakness or fatigue.  HEENT: Eyes: No visual loss, blurred vision, double vision or yellow sclerae.No hearing loss, sneezing, congestion, runny nose or sore throat.  SKIN: No rash or itching.  CARDIOVASCULAR: per HPI RESPIRATORY: No shortness of breath, cough or sputum.  GASTROINTESTINAL: No anorexia, nausea, vomiting or diarrhea. No abdominal pain or blood.  GENITOURINARY: No burning on urination, no polyuria NEUROLOGICAL: No headache, dizziness, syncope, paralysis, ataxia, numbness or tingling in the extremities. No change in bowel or bladder control.  MUSCULOSKELETAL: + leg edema  LYMPHATICS: No enlarged nodes. No history of splenectomy.  PSYCHIATRIC: No history of depression or anxiety.  ENDOCRINOLOGIC: No reports of sweating, cold or heat intolerance. No polyuria or polydipsia.  Marland Kitchen   Physical Examination p 50 bp  100/70 Wt 236 lbs BMI 37 Gen: resting comfortably, no acute distress HEENT: no scleral icterus, pupils equal round and reactive, no palptable cervical adenopathy,  CV: RRR, no m/r/g, no JVD Resp: Clear to auscultation bilaterally GI: abdomen is soft, non-tender, non-distended, normal bowel sounds, no hepatosplenomegaly MSK: extremities are warm, no edema.  Skin: warm, no rash Neuro:  no focal deficits Psych: appropriate affect   Diagnostic Studies 11/2013 Echo Study Conclusions  - Procedure narrative: Transthoracic echocardiography. Image quality was suboptimal. The study was technically difficult, as a result of poor sound wave transmission and body habitus. - Left ventricle: The cavity size was normal. Wall thickness was increased in a pattern of mild LVH. Systolic function was low normal. The estimated ejection fraction was approximately 50%. Images were inadequate for LV wall motion assessment, but no gross regional variation was noted. Features are consistent with a pseudonormal left ventricular filling pattern, with concomitant abnormal relaxation and increased filling pressure (grade 2 diastolic dysfunction). Doppler parameters are consistent with both elevated ventricular end-diastolic filling pressure and elevated left atrial filling pressure. - Mitral valve: Mildly calcified annulus. Mildly thickened leaflets . There was mild regurgitation. - Left atrium: The atrium was moderately to severely dilated.      Assessment and Plan  1. Chronic diastolic heart failure - echo 09/4401 with LVEF 50%, grade II diastolic dysfunction - only taking lasix once a day, does not like going to bathroom so frequently. Counseled ok, however if swelling significant increases she will need to take bid. - continue bp control  2. HTN - at goal, continue current meds  3. Bradycardia - she is on dilt and toprol, will stop dilt and follow heart rates - no current symptoms   F/u 4  months   Antoine Poche, M.D., F.A.C.C.

## 2014-01-02 NOTE — Patient Instructions (Signed)
Your physician recommends that you schedule a follow-up appointment in: 4 mons     STOP Diltiazem      Thank you for choosing [facility] !

## 2014-05-08 ENCOUNTER — Encounter (HOSPITAL_COMMUNITY): Payer: Self-pay

## 2014-05-08 ENCOUNTER — Emergency Department (HOSPITAL_COMMUNITY)
Admission: EM | Admit: 2014-05-08 | Discharge: 2014-05-08 | Disposition: A | Discharge status: Home / Self Care | Payer: Medicaid Other | Attending: Emergency Medicine | Admitting: Emergency Medicine

## 2014-05-08 ENCOUNTER — Emergency Department (HOSPITAL_COMMUNITY): Payer: Medicaid Other

## 2014-05-08 DIAGNOSIS — Z79899 Other long term (current) drug therapy: Secondary | ICD-10-CM | POA: Insufficient documentation

## 2014-05-08 DIAGNOSIS — F419 Anxiety disorder, unspecified: Secondary | ICD-10-CM | POA: Insufficient documentation

## 2014-05-08 DIAGNOSIS — R5383 Other fatigue: Secondary | ICD-10-CM | POA: Insufficient documentation

## 2014-05-08 DIAGNOSIS — I509 Heart failure, unspecified: Secondary | ICD-10-CM | POA: Diagnosis not present

## 2014-05-08 DIAGNOSIS — R509 Fever, unspecified: Secondary | ICD-10-CM | POA: Diagnosis present

## 2014-05-08 DIAGNOSIS — J3489 Other specified disorders of nose and nasal sinuses: Secondary | ICD-10-CM | POA: Insufficient documentation

## 2014-05-08 DIAGNOSIS — Z7982 Long term (current) use of aspirin: Secondary | ICD-10-CM | POA: Diagnosis not present

## 2014-05-08 DIAGNOSIS — E860 Dehydration: Secondary | ICD-10-CM | POA: Insufficient documentation

## 2014-05-08 DIAGNOSIS — R079 Chest pain, unspecified: Secondary | ICD-10-CM | POA: Diagnosis not present

## 2014-05-08 DIAGNOSIS — Z87891 Personal history of nicotine dependence: Secondary | ICD-10-CM | POA: Insufficient documentation

## 2014-05-08 DIAGNOSIS — J449 Chronic obstructive pulmonary disease, unspecified: Secondary | ICD-10-CM | POA: Insufficient documentation

## 2014-05-08 DIAGNOSIS — Z8701 Personal history of pneumonia (recurrent): Secondary | ICD-10-CM | POA: Insufficient documentation

## 2014-05-08 DIAGNOSIS — Z880 Allergy status to penicillin: Secondary | ICD-10-CM | POA: Insufficient documentation

## 2014-05-08 DIAGNOSIS — R110 Nausea: Secondary | ICD-10-CM | POA: Insufficient documentation

## 2014-05-08 DIAGNOSIS — R059 Cough, unspecified: Secondary | ICD-10-CM

## 2014-05-08 DIAGNOSIS — I10 Essential (primary) hypertension: Secondary | ICD-10-CM | POA: Insufficient documentation

## 2014-05-08 DIAGNOSIS — G20 Parkinson's disease: Secondary | ICD-10-CM | POA: Insufficient documentation

## 2014-05-08 DIAGNOSIS — Z87442 Personal history of urinary calculi: Secondary | ICD-10-CM | POA: Insufficient documentation

## 2014-05-08 DIAGNOSIS — R05 Cough: Secondary | ICD-10-CM | POA: Diagnosis not present

## 2014-05-08 HISTORY — DX: Heart failure, unspecified: I50.9

## 2014-05-08 LAB — BASIC METABOLIC PANEL
Anion gap: 10 (ref 5–15)
BUN: 12 mg/dL (ref 6–23)
CO2: 26 mmol/L (ref 19–32)
Calcium: 8.3 mg/dL — ABNORMAL LOW (ref 8.4–10.5)
Chloride: 97 mEq/L (ref 96–112)
Creatinine, Ser: 1.08 mg/dL (ref 0.50–1.10)
GFR calc Af Amer: 62 mL/min — ABNORMAL LOW (ref >90)
GFR calc non Af Amer: 53 mL/min — ABNORMAL LOW (ref >90)
Glucose, Bld: 138 mg/dL — ABNORMAL HIGH (ref 70–99)
Potassium: 3.4 mmol/L — ABNORMAL LOW (ref 3.5–5.1)
Sodium: 133 mmol/L — ABNORMAL LOW (ref 135–145)

## 2014-05-08 LAB — CBC WITH DIFFERENTIAL/PLATELET
Basophils Absolute: 0 10*3/uL (ref 0.0–0.1)
Basophils Relative: 0 % (ref 0–1)
Eosinophils Absolute: 0 10*3/uL (ref 0.0–0.7)
Eosinophils Relative: 0 % (ref 0–5)
HCT: 42 % (ref 36.0–46.0)
Hemoglobin: 14.1 g/dL (ref 12.0–15.0)
Lymphocytes Relative: 7 % — ABNORMAL LOW (ref 12–46)
Lymphs Abs: 0.6 10*3/uL — ABNORMAL LOW (ref 0.7–4.0)
MCH: 29.7 pg (ref 26.0–34.0)
MCHC: 33.6 g/dL (ref 30.0–36.0)
MCV: 88.6 fL (ref 78.0–100.0)
Monocytes Absolute: 0.6 10*3/uL (ref 0.1–1.0)
Monocytes Relative: 8 % (ref 3–12)
Neutro Abs: 7.1 10*3/uL (ref 1.7–7.7)
Neutrophils Relative %: 85 % — ABNORMAL HIGH (ref 43–77)
Platelets: 208 10*3/uL (ref 150–400)
RBC: 4.74 MIL/uL (ref 3.87–5.11)
RDW: 13.5 % (ref 11.5–15.5)
WBC: 8.4 10*3/uL (ref 4.0–10.5)

## 2014-05-08 LAB — URINALYSIS, ROUTINE W REFLEX MICROSCOPIC
Glucose, UA: NEGATIVE mg/dL
Ketones, ur: 40 mg/dL — AB
Leukocytes, UA: NEGATIVE
Nitrite: NEGATIVE
Protein, ur: 100 mg/dL — AB
Specific Gravity, Urine: >1.03 — ABNORMAL HIGH (ref 1.005–1.030)
Urobilinogen, UA: 1 mg/dL (ref 0.0–1.0)
pH: 6 (ref 5.0–8.0)

## 2014-05-08 LAB — URINE MICROSCOPIC-ADD ON

## 2014-05-08 LAB — I-STAT CG4 LACTIC ACID, ED: Lactic Acid, Venous: 1.85 mmol/L (ref 0.5–2.2)

## 2014-05-08 MED ORDER — ONDANSETRON HCL 4 MG/2ML IJ SOLN
4.0000 mg | Freq: Once | INTRAMUSCULAR | Status: AC
  Administered 2014-05-08: 4 mg via INTRAVENOUS
  Filled 2014-05-08: qty 2

## 2014-05-08 MED ORDER — SODIUM CHLORIDE 0.9 % IV BOLUS (SEPSIS)
500.0000 mL | Freq: Once | INTRAVENOUS | Status: AC
  Administered 2014-05-08: 500 mL via INTRAVENOUS

## 2014-05-08 NOTE — ED Provider Notes (Signed)
CSN: [id number]     Arrival date & time 05/08/14  9629 History  This chart was scribed for Joya Gaskins, MD by Tonye Royalty, ED Scribe. This patient was seen in room APA03/APA03 and the patient's care was started at 10:03 AM.    Chief Complaint  Patient presents with  . Urinary Tract Infection  . Fever   Patient is a 64 y.o. female presenting with dysuria. The history is provided by the patient. No language interpreter was used.  Dysuria Pain quality:  Burning Pain severity:  Mild Onset quality:  Sudden Duration:  3 days Timing:  Constant Progression:  Worsening Chronicity:  New Recent urinary tract infections: no   Relieved by:  Nothing Worsened by:  Nothing tried Ineffective treatments:  Antibiotics Urinary symptoms: no hematuria   Associated symptoms: fever and nausea   Associated symptoms: no vomiting   Risk factors comment:  Prior kidney stone surgery   HPI Comments: [place] is a 64 y.o. female with history of CHF, COPD, Parkinson's disease, HTN, HLD who presents to the Emergency Department complaining of UTI with associated fever and nausea with onset 3 days ago. She states she was diagnosed with UTI yesterday and started Levaquin yesterday. She reports associated dysuria, decreased appetite, rhinorrhea, cough, and right rib pain with cough. She denies falls or injuries. She states she did not have an x-ray yesterday. She denies vomiting or diarrhea.  Past Medical History  Diagnosis Date  . S/P colonoscopy 10/15/04    normal  . HTN (hypertension)   . Hypoglycemia   . Parkinson's disease   . Anxiety   . Depression   . COPD (chronic obstructive pulmonary disease)   . Asthma   . Kidney stones   . Pneumonia   . CHF (congestive heart failure)    Past Surgical History  Procedure Laterality Date  . Cholecystectomy    . Kidney stone surgery    . Colonoscopy  10/15/2004    Normal rectum/Normal colon  . Esophagogastroduodenoscopy  09/11/2010    Dysphagia  likely multifactorial (possible Candida esophagitis, likely nonspecific esophageal motility disorder, and/or uncontrolled gastroesophageal reflux disease), status post empiric dilation   Family History  Problem Relation Age of Onset  . COPD Mother   . Coronary artery disease Father   . Coronary artery disease Brother   . Coronary artery disease Sister    History  Substance Use Topics  . Smoking status: Former Smoker    Quit date: 01/03/1995  . Smokeless tobacco: Never Used  . Alcohol Use: No   OB History    No data available     Review of Systems  Constitutional: Positive for fever.  HENT: Positive for rhinorrhea.   Respiratory: Positive for cough.   Cardiovascular: Positive for chest pain.  Gastrointestinal: Positive for nausea. Negative for vomiting and diarrhea.  Genitourinary: Positive for dysuria.  All other systems reviewed and are negative.     Allergies  Penicillins and Sulfa antibiotics  Home Medications   Prior to Admission medications   Medication Sig Start Date End Date Taking? Authorizing Provider  acetaminophen (TYLENOL) 325 MG tablet Take 650 mg by mouth 2 (two) times daily as needed for headache.    Historical Provider, MD  albuterol (PROVENTIL HFA;VENTOLIN HFA) 108 (90 BASE) MCG/ACT inhaler Inhale 1-2 puffs into the lungs every 6 (six) hours as needed for wheezing. 01/12/13   [name], MD  aspirin EC 81 MG tablet Take 81 mg by mouth daily.  Historical Provider, MD  citalopram (CELEXA) 20 MG tablet Take 20 mg by mouth daily.    Historical Provider, MD  esomeprazole (NEXIUM) 20 MG capsule Take 20 mg by mouth daily at 12 noon.    Historical Provider, MD  fenofibrate micronized (LOFIBRA) 134 MG capsule Take 134 mg by mouth daily before breakfast.    Historical Provider, MD  furosemide (LASIX) 20 MG tablet Take 20 mg by mouth 2 (two) times daily. Take 1 tab daily starting 08/01/13. 07/30/13   Gwenyth BenderKaren M Black, NP  gabapentin (NEURONTIN) 100 MG capsule Take 300 mg  by mouth 2 (two) times daily.     Historical Provider, MD  lisinopril (PRINIVIL,ZESTRIL) 20 MG tablet Take 20 mg by mouth daily.    Historical Provider, MD  metFORMIN (GLUCOPHAGE) 500 MG tablet Take 500 mg by mouth daily with breakfast.    Historical Provider, MD  metoprolol succinate (TOPROL-XL) 25 MG 24 hr tablet Take 25 mg by mouth daily.    Historical Provider, MD  Omega-3 Fatty Acids (FISH OIL PO) Take 1 capsule by mouth 4 (four) times daily.    Historical Provider, MD  omeprazole (PRILOSEC) 20 MG capsule Take 20 mg by mouth at bedtime.     Historical Provider, MD  potassium chloride SA (K-DUR,KLOR-CON) 20 MEQ tablet Take 20 mEq by mouth 2 (two) times daily. Take 1 tab daily starting 07/31/13 07/30/13   Gwenyth BenderKaren M Black, NP  rosuvastatin (CRESTOR) 10 MG tablet Take 20 mg by mouth at bedtime.     Historical Provider, MD   BP 132/82 mmHg  Pulse 83  Temp(Src) 98.9 F (37.2 C) (Oral)  Resp 22  Ht 5\' 7"  (1.702 m)  Wt 237 lb (107.502 kg)  BMI 37.11 kg/m2  SpO2 98% Physical Exam  Nursing note and vitals reviewed.   CONSTITUTIONAL: Well developed/well nourished HEAD: Normocephalic/atraumatic EYES: EOMI/PERRL ENMT: Mucous membranes moist NECK: supple no meningeal signs SPINE/BACK:entire spine nontender CV: S1/S2 noted, no murmurs/rubs/gallops noted LUNGS: Lungs are clear to auscultation bilaterally, no apparent distress ABDOMEN: soft, nontender, no rebound or guarding, bowel sounds noted throughout abdomen GU:no cva tenderness NEURO: Pt is awake/alert/appropriate, moves all extremitiesx4.  No facial droop.   EXTREMITIES: pulses normal/equal, full ROM SKIN: warm, color normal PSYCH: no abnormalities of mood noted, alert and oriented to situation  ED Course  Procedures   DIAGNOSTIC STUDIES: Oxygen Saturation is 98% on room air, normal by my interpretation.    COORDINATION OF CARE: 10:08 AM Discussed treatment plan with patient at beside, including chest x-ray. The patient agrees with  the plan and has no further questions at this time.   Pt improved with IV fluids CXR/labs unremarkable Pt with dysuria and recent diagnosis of UTI (urine negative here) but encouraged to complete her course of levaquin I feel she is safe/appropriate for d/c home BP 120/78 mmHg  Pulse 73  Temp(Src) 98.9 F (37.2 C) (Oral)  Resp 16  Ht 5\' 7"  (1.702 m)  Wt 237 lb (107.502 kg)  BMI 37.11 kg/m2  SpO2 95%   Labs Review Labs Reviewed  BASIC METABOLIC PANEL - Abnormal; Notable for the following:    Sodium 133 (*)    Potassium 3.4 (*)    Glucose, Bld 138 (*)    Calcium 8.3 (*)    GFR calc non Af Amer 53 (*)    GFR calc Af Amer 62 (*)    All other components within normal limits  CBC WITH DIFFERENTIAL - Abnormal; Notable for the following:  Neutrophils Relative % 85 (*)    Lymphocytes Relative 7 (*)    Lymphs Abs 0.6 (*)    All other components within normal limits  URINALYSIS, ROUTINE W REFLEX MICROSCOPIC - Abnormal; Notable for the following:    Specific Gravity, Urine >1.030 (*)    Hgb urine dipstick SMALL (*)    Bilirubin Urine MODERATE (*)    Ketones, ur 40 (*)    Protein, ur 100 (*)    All other components within normal limits  URINE CULTURE  URINE MICROSCOPIC-ADD ON  I-STAT CG4 LACTIC ACID, ED    Imaging Review Dg Chest Portable 1 View  05/08/2014   CLINICAL DATA:  Productive cough. History of CHF, COPD, and hypertension.  EXAM: PORTABLE CHEST - 1 VIEW  COMPARISON:  07/28/2013  FINDINGS: Examination is partially limited by patient body habitus. Cardiac silhouette is mildly enlarged, similar to the prior study. No airspace consolidation, pulmonary edema, definite pleural effusion, or pneumothorax is identified. No acute osseous abnormality is identified.  IMPRESSION: No active disease.   Electronically Signed   By: Sebastian Ache   On: 05/08/2014 10:40     Medications  sodium chloride 0.9 % bolus 500 mL (0 mLs Intravenous Stopped 05/08/14 1255)  ondansetron (ZOFRAN)  injection 4 mg (4 mg Intravenous Given 05/08/14 1206)    MDM   Final diagnoses:  Cough  Other fatigue  Dehydration    Nursing notes including past medical history and social history reviewed and considered in documentation xrays/imaging reviewed by myself and considered during evaluation Labs/vital reviewed myself and considered during evaluation   I personally performed the services described in this documentation, which was scribed in my presence. The recorded information has been reviewed and is accurate.      Joya Gaskins, MD 05/08/14 [phone]

## 2014-05-08 NOTE — Discharge Instructions (Signed)

## 2014-05-08 NOTE — ED Notes (Signed)
Pt reports has had fever, nausea, productive cough, and generalized weakness since Monday.  REports went to Urgent Care and was put on levaquin Monday.  Pt says was told to f/u in ED if no better.  Pt also c/o pain in r ribs with coughing.

## 2014-05-10 LAB — URINE CULTURE

## 2014-05-13 ENCOUNTER — Telehealth (HOSPITAL_COMMUNITY): Payer: Self-pay

## 2014-05-13 NOTE — Telephone Encounter (Signed)
Post ED Visit - Positive Culture Follow-up  Culture report reviewed by antimicrobial stewardship pharmacist: []  Marlou SaWes Dulaney, Pharm.D., BCPS []  Celedonio MiyamotoJeremy Frens, Pharm.D., BCPS []  Georgina PillionElizabeth Martin, [address] BCPS []  WildwoodMinh Pham, [address]Ph[address]D., BCPS, AAHIVP []  Estella HuskMichelle Turner, Pharm.D., BCPS, AAHIVP []  Elder CyphersLorie Poole, [address]Ph[address] BCPS  Positive Urine culture, >/= 20,000 colonies -> E Coli No treatment, OK. No further patient follow-up is required at this time.  Arvid RightClark, Dolce Sylvia Dorn 05/13/2014, 5:33 AM

## 2014-05-15 ENCOUNTER — Encounter: Payer: Medicaid Other | Admitting: Cardiology

## 2014-05-15 NOTE — Progress Notes (Signed)
Clinical Summary Allison Rollins is a 64 y.o.female seen today for follow up of the following medical problems.   1. Chronic diastolic heart failure - +LE for approx 10 years which is overall stable - reports recent 130 lbs weight loss - notes DOE at <1 block which is stable. +orthopnea, uses 2 pillow chronically. .  - only taking lasix once a day, tired of going to bathroom all day.   2. HTN - compliant with meds  3. COPD - compliant with inhalers - no longer smoking  4. Bradycardia - she is on 2 AV nodal agents with Toprol XL and dilt - denies any lightheadedness   5. Hyperlipidemia - compliant with statin Past Medical History  Diagnosis Date  . S/P colonoscopy 10/15/04    normal  . HTN (hypertension)   . Hypoglycemia   . Parkinson's disease   . Anxiety   . Depression   . COPD (chronic obstructive pulmonary disease)   . Asthma   . Kidney stones   . Pneumonia   . CHF (congestive heart failure)      Allergies  Allergen Reactions  . Penicillins Itching and Rash  . Sulfa Antibiotics Rash     Current Outpatient Prescriptions  Medication Sig Dispense Refill  . acetaminophen (TYLENOL) 325 MG tablet Take 650 mg by mouth 2 (two) times daily as needed for headache.    . albuterol (PROVENTIL HFA;VENTOLIN HFA) 108 (90 BASE) MCG/ACT inhaler Inhale 1-2 puffs into the lungs every 6 (six) hours as needed for wheezing. 1 Inhaler 0  . aspirin EC 81 MG tablet Take 81 mg by mouth daily.    . citalopram (CELEXA) 20 MG tablet Take 20 mg by mouth daily.    Marland Kitchen. esomeprazole (NEXIUM) 20 MG capsule Take 20 mg by mouth daily at 12 noon.    . fenofibrate micronized (LOFIBRA) 134 MG capsule Take 134 mg by mouth daily before breakfast.    . furosemide (LASIX) 20 MG tablet Take 20 mg by mouth 2 (two) times daily. Take 1 tab daily starting 08/01/13.    Marland Kitchen. gabapentin (NEURONTIN) 100 MG capsule Take 300 mg by mouth 2 (two) times daily.     Marland Kitchen. lisinopril (PRINIVIL,ZESTRIL) 20 MG tablet Take 20 mg  by mouth daily.    . metFORMIN (GLUCOPHAGE) 500 MG tablet Take 500 mg by mouth daily with breakfast.    . metoprolol succinate (TOPROL-XL) 25 MG 24 hr tablet Take 25 mg by mouth daily.    . Omega-3 Fatty Acids (FISH OIL PO) Take 1 capsule by mouth 4 (four) times daily.    Marland Kitchen. omeprazole (PRILOSEC) 20 MG capsule Take 20 mg by mouth at bedtime.     . potassium chloride SA (K-DUR,KLOR-CON) 20 MEQ tablet Take 20 mEq by mouth 2 (two) times daily. Take 1 tab daily starting 07/31/13    . rosuvastatin (CRESTOR) 10 MG tablet Take 20 mg by mouth at bedtime.      No current facility-administered medications for this visit.     Past Surgical History  Procedure Laterality Date  . Cholecystectomy    . Kidney stone surgery    . Colonoscopy  10/15/2004    Normal rectum/Normal colon  . Esophagogastroduodenoscopy  09/11/2010    Dysphagia likely multifactorial (possible Candida esophagitis, likely nonspecific esophageal motility disorder, and/or uncontrolled gastroesophageal reflux disease), status post empiric dilation     Allergies  Allergen Reactions  . Penicillins Itching and Rash  . Sulfa Antibiotics Rash  Family History  Problem Relation Age of Onset  . COPD Mother   . Coronary artery disease Father   . Coronary artery disease Brother   . Coronary artery disease Sister      Social History Ms. Chittenden reports that she quit smoking about 19 years ago. She has never used smokeless tobacco. Ms. Tarnowski reports that she does not drink alcohol.   Review of Systems CONSTITUTIONAL: No weight loss, fever, chills, weakness or fatigue.  HEENT: Eyes: No visual loss, blurred vision, double vision or yellow sclerae.No hearing loss, sneezing, congestion, runny nose or sore throat.  SKIN: No rash or itching.  CARDIOVASCULAR:  RESPIRATORY: No shortness of breath, cough or sputum.  GASTROINTESTINAL: No anorexia, nausea, vomiting or diarrhea. No abdominal pain or blood.  GENITOURINARY: No burning  on urination, no polyuria NEUROLOGICAL: No headache, dizziness, syncope, paralysis, ataxia, numbness or tingling in the extremities. No change in bowel or bladder control.  MUSCULOSKELETAL: No muscle, back pain, joint pain or stiffness.  LYMPHATICS: No enlarged nodes. No history of splenectomy.  PSYCHIATRIC: No history of depression or anxiety.  ENDOCRINOLOGIC: No reports of sweating, cold or heat intolerance. No polyuria or polydipsia.  .   Physical Examination There were no vitals filed for this visit. There were no vitals filed for this visit.  Gen: resting comfortably, no acute distress HEENT: no scleral icterus, pupils equal round and reactive, no palptable cervical adenopathy,  CV Resp: Clear to auscultation bilaterally GI: abdomen is soft, non-tender, non-distended, normal bowel sounds, no hepatosplenomegaly MSK: extremities are warm, no edema.  Skin: warm, no rash Neuro:  no focal deficits Psych: appropriate affect   Diagnostic Studies 11/2013 Echo Study Conclusions  - Procedure narrative: Transthoracic echocardiography. Image quality was suboptimal. The study was technically difficult, as a result of poor sound wave transmission and body habitus. - Left ventricle: The cavity size was normal. Wall thickness was increased in a pattern of mild LVH. Systolic function was low normal. The estimated ejection fraction was approximately 50%. Images were inadequate for LV wall motion assessment, but no gross regional variation was noted. Features are consistent with a pseudonormal left ventricular filling pattern, with concomitant abnormal relaxation and increased filling pressure (grade 2 diastolic dysfunction). Doppler parameters are consistent with both elevated ventricular end-diastolic filling pressure and elevated left atrial filling pressure. - Mitral valve: Mildly calcified annulus. Mildly thickened leaflets . There was mild regurgitation. - Left atrium: The atrium  was moderately to severely dilated.    Assessment and Plan  1. Chronic diastolic heart failure - echo 11/2013 with LVEF 50%, grade II diastolic dysfunction - only taking lasix once a day, does not like going to bathroom so frequently. Counseled ok, however if swelling significant increases she will need to take bid. - continue bp control  2. HTN - at goal, continue current meds  3. Bradycardia - she is on dilt and toprol, will stop dilt and follow heart rates - no current symptoms       Allison Rollins, M.D.  

## 2014-07-11 ENCOUNTER — Encounter: Payer: Medicaid Other | Admitting: Cardiology

## 2014-07-11 ENCOUNTER — Encounter: Payer: Self-pay | Admitting: *Deleted

## 2014-07-11 NOTE — Progress Notes (Signed)
Clinical Summary Allison Rollins is a 64 y.o.female seen today for follow up of the following medical problems.   1. Chronic diastolic heart failure - +LE for approx 10 years which is overall stable - reports recent 130 lbs weight loss - notes DOE at <1 block which is stable. +orthopnea, uses 2 pillow chronically. .  - only taking lasix once a day, tired of going to bathroom all day.   2. HTN - compliant with meds  3. COPD - compliant with inhalers - no longer smoking  4. Bradycardia - she is on 2 AV nodal agents with Toprol XL and dilt - denies any lightheadedness   5. Hyperlipidemia - compliant with statin Past Medical History  Diagnosis Date  . S/P colonoscopy 10/15/04    normal  . HTN (hypertension)   . Hypoglycemia   . Parkinson's disease   . Anxiety   . Depression   . COPD (chronic obstructive pulmonary disease)   . Asthma   . Kidney stones   . Pneumonia   . CHF (congestive heart failure)      Allergies  Allergen Reactions  . Penicillins Itching and Rash  . Sulfa Antibiotics Rash     Current Outpatient Prescriptions  Medication Sig Dispense Refill  . acetaminophen (TYLENOL) 325 MG tablet Take 650 mg by mouth 2 (two) times daily as needed for headache.    . albuterol (PROVENTIL HFA;VENTOLIN HFA) 108 (90 BASE) MCG/ACT inhaler Inhale 1-2 puffs into the lungs every 6 (six) hours as needed for wheezing. 1 Inhaler 0  . aspirin EC 81 MG tablet Take 81 mg by mouth daily.    . citalopram (CELEXA) 20 MG tablet Take 20 mg by mouth daily.    Marland Kitchen. esomeprazole (NEXIUM) 20 MG capsule Take 20 mg by mouth daily at 12 noon.    . fenofibrate micronized (LOFIBRA) 134 MG capsule Take 134 mg by mouth daily before breakfast.    . furosemide (LASIX) 20 MG tablet Take 20 mg by mouth 2 (two) times daily. Take 1 tab daily starting 08/01/13.    Marland Kitchen. gabapentin (NEURONTIN) 100 MG capsule Take 300 mg by mouth 2 (two) times daily.     Marland Kitchen. lisinopril (PRINIVIL,ZESTRIL) 20 MG tablet Take 20 mg  by mouth daily.    . metFORMIN (GLUCOPHAGE) 500 MG tablet Take 500 mg by mouth daily with breakfast.    . metoprolol succinate (TOPROL-XL) 25 MG 24 hr tablet Take 25 mg by mouth daily.    . Omega-3 Fatty Acids (FISH OIL PO) Take 1 capsule by mouth 4 (four) times daily.    Marland Kitchen. omeprazole (PRILOSEC) 20 MG capsule Take 20 mg by mouth at bedtime.     . potassium chloride SA (K-DUR,KLOR-CON) 20 MEQ tablet Take 20 mEq by mouth 2 (two) times daily. Take 1 tab daily starting 07/31/13    . rosuvastatin (CRESTOR) 10 MG tablet Take 20 mg by mouth at bedtime.      No current facility-administered medications for this visit.     Past Surgical History  Procedure Laterality Date  . Cholecystectomy    . Kidney stone surgery    . Colonoscopy  10/15/2004    Normal rectum/Normal colon  . Esophagogastroduodenoscopy  09/11/2010    Dysphagia likely multifactorial (possible Candida esophagitis, likely nonspecific esophageal motility disorder, and/or uncontrolled gastroesophageal reflux disease), status post empiric dilation     Allergies  Allergen Reactions  . Penicillins Itching and Rash  . Sulfa Antibiotics Rash  Family History  Problem Relation Age of Onset  . COPD Mother   . Coronary artery disease Father   . Coronary artery disease Brother   . Coronary artery disease Sister      Social History Ms. Salado reports that she quit smoking about 19 years ago. She has never used smokeless tobacco. Ms. Blake DivineSessor reports that she does not drink alcohol.   Review of Systems CONSTITUTIONAL: No weight loss, fever, chills, weakness or fatigue.  HEENT: Eyes: No visual loss, blurred vision, double vision or yellow sclerae.No hearing loss, sneezing, congestion, runny nose or sore throat.  SKIN: No rash or itching.  CARDIOVASCULAR:  RESPIRATORY: No shortness of breath, cough or sputum.  GASTROINTESTINAL: No anorexia, nausea, vomiting or diarrhea. No abdominal pain or blood.  GENITOURINARY: No burning  on urination, no polyuria NEUROLOGICAL: No headache, dizziness, syncope, paralysis, ataxia, numbness or tingling in the extremities. No change in bowel or bladder control.  MUSCULOSKELETAL: No muscle, back pain, joint pain or stiffness.  LYMPHATICS: No enlarged nodes. No history of splenectomy.  PSYCHIATRIC: No history of depression or anxiety.  ENDOCRINOLOGIC: No reports of sweating, cold or heat intolerance. No polyuria or polydipsia.  Marland Kitchen.   Physical Examination There were no vitals filed for this visit. There were no vitals filed for this visit.  Gen: resting comfortably, no acute distress HEENT: no scleral icterus, pupils equal round and reactive, no palptable cervical adenopathy,  CV Resp: Clear to auscultation bilaterally GI: abdomen is soft, non-tender, non-distended, normal bowel sounds, no hepatosplenomegaly MSK: extremities are warm, no edema.  Skin: warm, no rash Neuro:  no focal deficits Psych: appropriate affect   Diagnostic Studies 11/2013 Echo Study Conclusions  - Procedure narrative: Transthoracic echocardiography. Image quality was suboptimal. The study was technically difficult, as a result of poor sound wave transmission and body habitus. - Left ventricle: The cavity size was normal. Wall thickness was increased in a pattern of mild LVH. Systolic function was low normal. The estimated ejection fraction was approximately 50%. Images were inadequate for LV wall motion assessment, but no gross regional variation was noted. Features are consistent with a pseudonormal left ventricular filling pattern, with concomitant abnormal relaxation and increased filling pressure (grade 2 diastolic dysfunction). Doppler parameters are consistent with both elevated ventricular end-diastolic filling pressure and elevated left atrial filling pressure. - Mitral valve: Mildly calcified annulus. Mildly thickened leaflets . There was mild regurgitation. - Left atrium: The atrium  was moderately to severely dilated.    Assessment and Plan  1. Chronic diastolic heart failure - echo 8/11918/2015 with LVEF 50%, grade II diastolic dysfunction - only taking lasix once a day, does not like going to bathroom so frequently. Counseled ok, however if swelling significant increases she will need to take bid. - continue bp control  2. HTN - at goal, continue current meds  3. Bradycardia - she is on dilt and toprol, will stop dilt and follow heart rates - no current symptoms       Antoine PocheJonathan F. Branch, M.D.

## 2014-08-08 ENCOUNTER — Emergency Department (HOSPITAL_COMMUNITY)
Admission: EM | Admit: 2014-08-08 | Discharge: 2014-08-08 | Disposition: A | Discharge status: Home / Self Care | Payer: Medicaid Other | Attending: Emergency Medicine | Admitting: Emergency Medicine

## 2014-08-08 ENCOUNTER — Encounter (HOSPITAL_COMMUNITY): Payer: Self-pay | Admitting: *Deleted

## 2014-08-08 ENCOUNTER — Emergency Department (HOSPITAL_COMMUNITY): Payer: Medicaid Other

## 2014-08-08 DIAGNOSIS — Z7982 Long term (current) use of aspirin: Secondary | ICD-10-CM | POA: Insufficient documentation

## 2014-08-08 DIAGNOSIS — Y9389 Activity, other specified: Secondary | ICD-10-CM | POA: Diagnosis not present

## 2014-08-08 DIAGNOSIS — S3992XA Unspecified injury of lower back, initial encounter: Secondary | ICD-10-CM | POA: Insufficient documentation

## 2014-08-08 DIAGNOSIS — W010XXA Fall on same level from slipping, tripping and stumbling without subsequent striking against object, initial encounter: Secondary | ICD-10-CM | POA: Diagnosis not present

## 2014-08-08 DIAGNOSIS — Y9289 Other specified places as the place of occurrence of the external cause: Secondary | ICD-10-CM | POA: Insufficient documentation

## 2014-08-08 DIAGNOSIS — Y998 Other external cause status: Secondary | ICD-10-CM | POA: Insufficient documentation

## 2014-08-08 DIAGNOSIS — S300XXA Contusion of lower back and pelvis, initial encounter: Secondary | ICD-10-CM | POA: Diagnosis not present

## 2014-08-08 DIAGNOSIS — Z87442 Personal history of urinary calculi: Secondary | ICD-10-CM | POA: Insufficient documentation

## 2014-08-08 DIAGNOSIS — Z79899 Other long term (current) drug therapy: Secondary | ICD-10-CM | POA: Insufficient documentation

## 2014-08-08 DIAGNOSIS — S6992XA Unspecified injury of left wrist, hand and finger(s), initial encounter: Secondary | ICD-10-CM | POA: Insufficient documentation

## 2014-08-08 DIAGNOSIS — T148 Other injury of unspecified body region: Secondary | ICD-10-CM | POA: Diagnosis not present

## 2014-08-08 DIAGNOSIS — Z87891 Personal history of nicotine dependence: Secondary | ICD-10-CM | POA: Insufficient documentation

## 2014-08-08 DIAGNOSIS — I10 Essential (primary) hypertension: Secondary | ICD-10-CM | POA: Diagnosis not present

## 2014-08-08 DIAGNOSIS — T07XXXA Unspecified multiple injuries, initial encounter: Secondary | ICD-10-CM

## 2014-08-08 DIAGNOSIS — J449 Chronic obstructive pulmonary disease, unspecified: Secondary | ICD-10-CM | POA: Insufficient documentation

## 2014-08-08 DIAGNOSIS — I509 Heart failure, unspecified: Secondary | ICD-10-CM | POA: Diagnosis not present

## 2014-08-08 DIAGNOSIS — F419 Anxiety disorder, unspecified: Secondary | ICD-10-CM | POA: Diagnosis not present

## 2014-08-08 DIAGNOSIS — Z8701 Personal history of pneumonia (recurrent): Secondary | ICD-10-CM | POA: Diagnosis not present

## 2014-08-08 DIAGNOSIS — Z880 Allergy status to penicillin: Secondary | ICD-10-CM | POA: Insufficient documentation

## 2014-08-08 DIAGNOSIS — G20 Parkinson's disease: Secondary | ICD-10-CM | POA: Diagnosis not present

## 2014-08-08 DIAGNOSIS — T1490XA Injury, unspecified, initial encounter: Secondary | ICD-10-CM

## 2014-08-08 DIAGNOSIS — S20219A Contusion of unspecified front wall of thorax, initial encounter: Secondary | ICD-10-CM | POA: Diagnosis not present

## 2014-08-08 MED ORDER — DIAZEPAM 5 MG PO TABS
5.0000 mg | ORAL_TABLET | Freq: Once | ORAL | Status: AC
  Administered 2014-08-08: 5 mg via ORAL
  Filled 2014-08-08: qty 1

## 2014-08-08 MED ORDER — OXYCODONE-ACETAMINOPHEN 5-325 MG PO TABS
2.0000 | ORAL_TABLET | Freq: Once | ORAL | Status: AC
  Administered 2014-08-08: 2 via ORAL
  Filled 2014-08-08: qty 2

## 2014-08-08 MED ORDER — DIAZEPAM 2 MG PO TABS: 2.0000 mg | ORAL_TABLET | ORAL | Status: DC | PRN

## 2014-08-08 MED ORDER — OXYCODONE-ACETAMINOPHEN 5-325 MG PO TABS: 2.0000 | ORAL_TABLET | ORAL | Status: DC | PRN

## 2014-08-08 NOTE — ED Provider Notes (Signed)
CSN: [id number]     Arrival date & time 08/08/14  1946 History  This chart was scribed for [name], MD by [name], ED Scribe. This patient was seen in room APA01/APA01 and the patient's care was started at 9:22 PM.    Chief Complaint  Patient presents with  . Fall   The history is provided by the patient. No language interpreter was used.    HPI Comments: Allison Rollins is a 64 y.o. female who presents to the Emergency Department complaining of constant, sharp, left-sided lower back pain that started 2 days ago after she tripped and fell on her left-side. She states mild left wrist pain as an associated symptom. She has not tried any treatment PTA. Pt reports that she was pumping gas in the rain when she tripped on something and fell onto her left side. She denies LOC. Pt also denies CP, abdominal pain, hip pain and rib pain as associated symptoms.  Past Medical History  Diagnosis Date  . S/P colonoscopy 10/15/04    normal  . HTN (hypertension)   . Hypoglycemia   . Parkinson's disease   . Anxiety   . Depression   . COPD (chronic obstructive pulmonary disease)   . Asthma   . Kidney stones   . Pneumonia   . CHF (congestive heart failure)    Past Surgical History  Procedure Laterality Date  . Cholecystectomy    . Kidney stone surgery    . Colonoscopy  10/15/2004    Normal rectum/Normal colon  . Esophagogastroduodenoscopy  09/11/2010    Dysphagia likely multifactorial (possible Candida esophagitis, likely nonspecific esophageal motility disorder, and/or uncontrolled gastroesophageal reflux disease), status post empiric dilation   Family History  Problem Relation Age of Onset  . COPD Mother   . Coronary artery disease Father   . Coronary artery disease Brother   . Coronary artery disease Sister    History  Substance Use Topics  . Smoking status: Former Smoker    Quit date: 01/03/1995  . Smokeless tobacco: Never Used  . Alcohol Use: No   OB History    No  data available     Review of Systems  Cardiovascular: Negative for chest pain.  Gastrointestinal: Negative for abdominal pain.  Musculoskeletal: Positive for back pain and arthralgias.  All other systems reviewed and are negative.   Allergies  Penicillins and Sulfa antibiotics  Home Medications   Prior to Admission medications   Medication Sig Start Date End Date Taking? Authorizing Provider  acetaminophen (TYLENOL) 325 MG tablet Take 650 mg by mouth 2 (two) times daily as needed for headache.    Historical Provider, MD  albuterol (PROVENTIL HFA;VENTOLIN HFA) 108 (90 BASE) MCG/ACT inhaler Inhale 1-2 puffs into the lungs every 6 (six) hours as needed for wheezing. 01/12/13   [name], MD  aspirin EC 81 MG tablet Take 81 mg by mouth daily.    Historical Provider, MD  citalopram (CELEXA) 20 MG tablet Take 20 mg by mouth daily.    Historical Provider, MD  esomeprazole (NEXIUM) 20 MG capsule Take 20 mg by mouth daily at 12 noon.    Historical Provider, MD  fenofibrate micronized (LOFIBRA) 134 MG capsule Take 134 mg by mouth daily before breakfast.    Historical Provider, MD  furosemide (LASIX) 20 MG tablet Take 20 mg by mouth 2 (two) times daily. Take 1 tab daily starting 08/01/13. 07/30/13   [name], NP  gabapentin (NEURONTIN) 100 MG capsule Take 300  mg by mouth 2 (two) times daily.     Historical Provider, MD  lisinopril (PRINIVIL,ZESTRIL) 20 MG tablet Take 20 mg by mouth daily.    Historical Provider, MD  metFORMIN (GLUCOPHAGE) 500 MG tablet Take 500 mg by mouth daily with breakfast.    Historical Provider, MD  metoprolol succinate (TOPROL-XL) 25 MG 24 hr tablet Take 25 mg by mouth daily.    Historical Provider, MD  Omega-3 Fatty Acids (FISH OIL PO) Take 1 capsule by mouth 4 (four) times daily.    Historical Provider, MD  omeprazole (PRILOSEC) 20 MG capsule Take 20 mg by mouth at bedtime.     Historical Provider, MD  potassium chloride SA (K-DUR,KLOR-CON) 20 MEQ tablet Take 20 mEq by  mouth 2 (two) times daily. Take 1 tab daily starting 07/31/13 07/30/13   Gwenyth BenderKaren M Black, NP  rosuvastatin (CRESTOR) 10 MG tablet Take 20 mg by mouth at bedtime.     Historical Provider, MD   BP 147/75 mmHg  Pulse 51  Temp(Src) 97.9 F (36.6 C) (Oral)  Resp 20  Ht 5\' 7"  (1.702 m)  Wt 237 lb (107.502 kg)  BMI 37.11 kg/m2  SpO2 97% Physical Exam  Constitutional: She is oriented to person, place, and time. She appears well-developed and well-nourished.  Non-toxic appearance. No distress.  HENT:  Head: Normocephalic and atraumatic.  Eyes: Conjunctivae, EOM and lids are normal. Pupils are equal, round, and reactive to light.  Neck: Normal range of motion. Neck supple. No tracheal deviation present. No thyroid mass present.  Cardiovascular: Normal rate, regular rhythm and normal heart sounds.  Exam reveals no gallop.   No murmur heard. Pulmonary/Chest: Effort normal and breath sounds normal. No stridor. No respiratory distress. She has no decreased breath sounds. She has no wheezes. She has no rhonchi. She has no rales.  Abdominal: Soft. Normal appearance and bowel sounds are normal. She exhibits no distension. There is no tenderness. There is no rebound and no CVA tenderness.  Musculoskeletal: Normal range of motion. She exhibits tenderness. She exhibits no edema.  LE No shortening of rotation; TTP at the left lumbar-sacral region; left wrist full ROM, no deformity noted, 2+ radial pulses; left hand no bruises noted, no swelling   Neurological: She is alert and oriented to person, place, and time. She has normal strength. No cranial nerve deficit or sensory deficit. GCS eye subscore is 4. GCS verbal subscore is 5. GCS motor subscore is 6.  Skin: Skin is warm and dry. No abrasion and no rash noted.  Psychiatric: She has a normal mood and affect. Her speech is normal and behavior is normal.  Nursing note and vitals reviewed.   ED Course  Procedures   DIAGNOSTIC STUDIES: Oxygen Saturation is 97%  on RA, normal by my interpretation.    COORDINATION OF CARE: 9:39 PM Discussed treatment plan with pt which includes x-rays of her thoracic and lumbar spine. Pt agreed to plan.  Labs Review Labs Reviewed - No data to display  Imaging Review No results found.   EKG Interpretation None      MDM   Final diagnoses:  None   I personally performed the services described in this documentation, which was scribed in my presence. The recorded information has been reviewed and is accurate.   Patient's x-rays negative. Stable for discharge  Lorre NickAnthony Sharelle Burditt, MD 08/08/14 2229

## 2014-08-08 NOTE — ED Notes (Signed)
Discharge instructions given, pt demonstrated teach back and verbal understanding. No concerns voiced.  

## 2014-08-08 NOTE — ED Notes (Signed)
Fell 2 days ago.  Pain lt wrist, ,low back   No LOC

## 2014-08-08 NOTE — Discharge Instructions (Signed)
Contusion °A contusion is a deep bruise. Contusions are the result of an injury that caused bleeding under the skin. The contusion may turn blue, purple, or yellow. Minor injuries will give you a painless contusion, but more severe contusions may stay painful and swollen for a few weeks.  °CAUSES  °A contusion is usually caused by a blow, trauma, or direct force to an area of the body. °SYMPTOMS  °· Swelling and redness of the injured area. °· Bruising of the injured area. °· Tenderness and soreness of the injured area. °· Pain. °DIAGNOSIS  °The diagnosis can be made by taking a history and physical exam. An X-ray, CT scan, or MRI may be needed to determine if there were any associated injuries, such as fractures. °TREATMENT  °Specific treatment will depend on what area of the body was injured. In general, the best treatment for a contusion is resting, icing, elevating, and applying cold compresses to the injured area. Over-the-counter medicines may also be recommended for pain control. Ask your caregiver what the best treatment is for your contusion. °HOME CARE INSTRUCTIONS  °· Put ice on the injured area. °¨ Put ice in a plastic bag. °¨ Place a towel between your skin and the bag. °¨ Leave the ice on for 15-20 minutes, 3-4 times a day, or as directed by your health care provider. °· Only take over-the-counter or prescription medicines for pain, discomfort, or fever as directed by your caregiver. Your caregiver may recommend avoiding anti-inflammatory medicines (aspirin, ibuprofen, and naproxen) for 48 hours because these medicines may increase bruising. °· Rest the injured area. °· If possible, elevate the injured area to reduce swelling. °SEEK IMMEDIATE MEDICAL CARE IF:  °· You have increased bruising or swelling. °· You have pain that is getting worse. °· Your swelling or pain is not relieved with medicines. °MAKE SURE YOU:  °· Understand these instructions. °· Will watch your condition. °· Will get help right  away if you are not doing well or get worse. °Document Released: 01/20/2005 Document Revised: 04/17/2013 Document Reviewed: 02/15/2011 °[employer]® Patient Information ©2015 [employer]. This information is not intended to replace advice given to you by your health care provider. Make sure you discuss any questions you have with your health care provider. ° °

## 2014-09-03 ENCOUNTER — Ambulatory Visit (INDEPENDENT_AMBULATORY_CARE_PROVIDER_SITE_OTHER): Payer: Medicaid Other | Admitting: Cardiology

## 2014-09-03 ENCOUNTER — Encounter: Payer: Self-pay | Admitting: Cardiology

## 2014-09-03 ENCOUNTER — Other Ambulatory Visit: Payer: Self-pay

## 2014-09-03 VITALS — BP 124/88 | HR 55 | Ht 66.0 in | Wt 239.0 lb

## 2014-09-03 DIAGNOSIS — E785 Hyperlipidemia, unspecified: Secondary | ICD-10-CM | POA: Diagnosis not present

## 2014-09-03 DIAGNOSIS — I5032 Chronic diastolic (congestive) heart failure: Secondary | ICD-10-CM

## 2014-09-03 DIAGNOSIS — R001 Bradycardia, unspecified: Secondary | ICD-10-CM

## 2014-09-03 DIAGNOSIS — I10 Essential (primary) hypertension: Secondary | ICD-10-CM | POA: Diagnosis not present

## 2014-09-03 MED ORDER — LISINOPRIL 40 MG PO TABS: 40.0000 mg | ORAL_TABLET | Freq: Every day | ORAL | Status: DC

## 2014-09-03 NOTE — Patient Instructions (Signed)
Your physician recommends that you schedule a follow-up appointment in: 4 months with Dr Wyline MoodBranch    STOP Toprol XL    INCREASE lisinopril to 40 mg daily     Please get lab work, lab slip provided FASTING       Thank you for choosing [facility] !

## 2014-09-03 NOTE — Progress Notes (Signed)
Clinical Summary Allison Rollins is a 64 y.o.female seen today for follow up of the following medical problems.   1. Chronic diastolic heart failure - reports increased LE edema over the last few weeks. She has mixed compliance with her lasix, often not taking because she doesn't have time to frequently go to the bathroom at work. Notes some orthopnea as well.   2. HTN - compliant with meds  3. COPD - compliant with inhalers  4. Bradycardia - notes some lightheadness at times   5. Hyperlipidemia - compliant with statin - no recent lipid panel in our system  6. Mechnical fall - seen in ER 07/2014 after fall, from notes tripped and lost her balance. Significant hip pain since that time.     Past Medical History  Diagnosis Date  . S/P colonoscopy 10/15/04    normal  . HTN (hypertension)   . Hypoglycemia   . Parkinson's disease   . Anxiety   . Depression   . COPD (chronic obstructive pulmonary disease)   . Asthma   . Kidney stones   . Pneumonia   . CHF (congestive heart failure)      Allergies  Allergen Reactions  . Penicillins Itching and Rash  . Sulfa Antibiotics Rash     Current Outpatient Prescriptions  Medication Sig Dispense Refill  . acetaminophen (TYLENOL) 325 MG tablet Take 650 mg by mouth 2 (two) times daily as needed for headache.    . albuterol (PROVENTIL HFA;VENTOLIN HFA) 108 (90 BASE) MCG/ACT inhaler Inhale 1-2 puffs into the lungs every 6 (six) hours as needed for wheezing. 1 Inhaler 0  . aspirin EC 81 MG tablet Take 81 mg by mouth daily.    . diazepam (VALIUM) 2 MG tablet Take 1 tablet (2 mg total) by mouth every 4 (four) hours as needed for muscle spasms. 30 tablet 0  . esomeprazole (NEXIUM) 20 MG capsule Take 20 mg by mouth at bedtime.     . fenofibrate micronized (LOFIBRA) 134 MG capsule Take 134 mg by mouth daily before breakfast.    . furosemide (LASIX) 20 MG tablet Take 20 mg by mouth 2 (two) times daily. Take 1 tab daily starting 08/01/13.      Marland Kitchen. gabapentin (NEURONTIN) 300 MG capsule Take 300 mg by mouth 2 (two) times daily.    Marland Kitchen. lisinopril (PRINIVIL,ZESTRIL) 20 MG tablet Take 20 mg by mouth daily.    . metFORMIN (GLUCOPHAGE) 500 MG tablet Take 500 mg by mouth daily with breakfast.    . metoprolol succinate (TOPROL-XL) 25 MG 24 hr tablet Take 25 mg by mouth daily.    . Omega-3 Fatty Acids (FISH OIL PO) Take 6 capsules by mouth daily.     Marland Kitchen. oxyCODONE-acetaminophen (PERCOCET/ROXICET) 5-325 MG per tablet Take 2 tablets by mouth every 4 (four) hours as needed for severe pain. 15 tablet 0  . potassium chloride SA (K-DUR,KLOR-CON) 20 MEQ tablet Take 20 mEq by mouth 2 (two) times daily.     . rosuvastatin (CRESTOR) 20 MG tablet Take 20 mg by mouth daily.     No current facility-administered medications for this visit.     Past Surgical History  Procedure Laterality Date  . Cholecystectomy    . Kidney stone surgery    . Colonoscopy  10/15/2004    Normal rectum/Normal colon  . Esophagogastroduodenoscopy  09/11/2010    Dysphagia likely multifactorial (possible Candida esophagitis, likely nonspecific esophageal motility disorder, and/or uncontrolled gastroesophageal reflux disease), status post empiric dilation  Allergies  Allergen Reactions  . Penicillins Itching and Rash  . Sulfa Antibiotics Rash      Family History  Problem Relation Age of Onset  . COPD Mother   . Coronary artery disease Father   . Coronary artery disease Brother   . Coronary artery disease Sister      Social History Allison Rollins reports that she quit smoking about 19 years ago. She has never used smokeless tobacco. Allison Rollins reports that she does not drink alcohol.   Review of Systems CONSTITUTIONAL: No weight loss, fever, chills, weakness or fatigue.  HEENT: Eyes: No visual loss, blurred vision, double vision or yellow sclerae.No hearing loss, sneezing, congestion, runny nose or sore throat.  SKIN: No rash or itching.  CARDIOVASCULAR: per  HPI RESPIRATORY: No shortness of breath, cough or sputum.  GASTROINTESTINAL: No anorexia, nausea, vomiting or diarrhea. No abdominal pain or blood.  GENITOURINARY: No burning on urination, no polyuria NEUROLOGICAL: No headache, dizziness, syncope, paralysis, ataxia, numbness or tingling in the extremities. No change in bowel or bladder control.  MUSCULOSKELETAL: No muscle, back pain, joint pain or stiffness.  LYMPHATICS: No enlarged nodes. No history of splenectomy.  PSYCHIATRIC: No history of depression or anxiety.  ENDOCRINOLOGIC: No reports of sweating, cold or heat intolerance. No polyuria or polydipsia.  Marland Kitchen.   Physical Examination p 55 bp 124/88 Wt 239 lbs BMI 39 Gen: resting comfortably, no acute distress HEENT: no scleral icterus, pupils equal round and reactive, no palptable cervical adenopathy,  CV: RRR, no m/r/g, no JVD Resp: Clear to auscultation bilaterally GI: abdomen is soft, non-tender, non-distended, normal bowel sounds, no hepatosplenomegaly MSK: extremities are warm, 1+ bilateral LE edema Skin: warm, no rash Neuro:  no focal deficits Psych: appropriate affect   Diagnostic Studies 11/2013 Echo Study Conclusions  - Procedure narrative: Transthoracic echocardiography. Image quality was suboptimal. The study was technically difficult, as a result of poor sound wave transmission and body habitus. - Left ventricle: The cavity size was normal. Wall thickness was increased in a pattern of mild LVH. Systolic function was low normal. The estimated ejection fraction was approximately 50%. Images were inadequate for LV wall motion assessment, but no gross regional variation was noted. Features are consistent with a pseudonormal left ventricular filling pattern, with concomitant abnormal relaxation and increased filling pressure (grade 2 diastolic dysfunction). Doppler parameters are consistent with both elevated ventricular end-diastolic filling pressure and elevated left  atrial filling pressure. - Mitral valve: Mildly calcified annulus. Mildly thickened leaflets . There was mild regurgitation. - Left atrium: The atrium was moderately to severely dilated.    Assessment and Plan  1. Chronic diastolic heart failure - echo 4/78298/2015 with LVEF 50%, grade II diastolic dysfunction - evidence of hypervolemia by exam and symptoms, encouraged increased compliance with lasix.   2. HTN - at goal, continue current meds  3. Bradycardia - stop Toprol XL as there is no secondary indication, will increase her lisionpril to 40mg  daily to continue bp control. Check CMET in 2 weeks.    4. Hyperlipidemia - repeat lipid panel, continue current statin.   F/u 4 months  Antoine PocheJonathan F. Viola Placeres, M.D.

## 2014-09-19 LAB — CBC
HCT: 43.7 % (ref 36.0–46.0)
Hemoglobin: 14.3 g/dL (ref 12.0–15.0)
MCH: 28.4 pg (ref 26.0–34.0)
MCHC: 32.7 g/dL (ref 30.0–36.0)
MCV: 86.9 fL (ref 78.0–100.0)
MPV: 11.3 fL (ref 8.6–12.4)
Platelets: 249 10*3/uL (ref 150–400)
RBC: 5.03 MIL/uL (ref 3.87–5.11)
RDW: 14.3 % (ref 11.5–15.5)
WBC: 7.3 10*3/uL (ref 4.0–10.5)

## 2014-09-20 LAB — COMPREHENSIVE METABOLIC PANEL
ALT: 20 U/L (ref 0–35)
AST: 20 U/L (ref 0–37)
Albumin: 3.7 g/dL (ref 3.5–5.2)
Alkaline Phosphatase: 86 U/L (ref 39–117)
BUN: 21 mg/dL (ref 6–23)
CO2: 29 mEq/L (ref 19–32)
Calcium: 9.2 mg/dL (ref 8.4–10.5)
Chloride: 103 mEq/L (ref 96–112)
Creat: 0.97 mg/dL (ref 0.50–1.10)
Glucose, Bld: 118 mg/dL — ABNORMAL HIGH (ref 70–99)
Potassium: 4.3 mEq/L (ref 3.5–5.3)
Sodium: 141 mEq/L (ref 135–145)
Total Bilirubin: 0.4 mg/dL (ref 0.2–1.2)
Total Protein: 6.5 g/dL (ref 6.0–8.3)

## 2014-09-20 LAB — TSH: TSH: 2.505 u[IU]/mL (ref 0.350–4.500)

## 2014-09-20 LAB — MAGNESIUM: Magnesium: 2 mg/dL (ref 1.5–2.5)

## 2014-09-20 LAB — HEMOGLOBIN A1C
Hgb A1c MFr Bld: 6.2 % — ABNORMAL HIGH (ref <5.7)
Mean Plasma Glucose: 131 mg/dL — ABNORMAL HIGH (ref <117)

## 2014-09-20 LAB — LIPID PANEL
Cholesterol: 248 mg/dL — ABNORMAL HIGH (ref 0–200)
HDL: 36 mg/dL — ABNORMAL LOW (ref >=46)
LDL Cholesterol: 163 mg/dL — ABNORMAL HIGH (ref 0–99)
Total CHOL/HDL Ratio: 6.9 Ratio
Triglycerides: 246 mg/dL — ABNORMAL HIGH (ref <150)
VLDL: 49 mg/dL — ABNORMAL HIGH (ref 0–40)

## 2014-09-24 ENCOUNTER — Telehealth: Payer: Self-pay | Admitting: Cardiology

## 2014-09-24 NOTE — Telephone Encounter (Signed)
Pt wanted lab results mailed to her home

## 2014-09-24 NOTE — Telephone Encounter (Signed)
pls call concerning lab work

## 2014-11-21 ENCOUNTER — Telehealth: Payer: Self-pay | Admitting: Family Medicine

## 2014-11-22 NOTE — Telephone Encounter (Signed)
Patient will call back to schedule. She will be 64 in September and wants to establish here once she has her Medicare insurance straightened out.

## 2014-12-23 ENCOUNTER — Other Ambulatory Visit (HOSPITAL_COMMUNITY): Payer: Self-pay | Admitting: Respiratory Therapy

## 2014-12-23 DIAGNOSIS — F5105 Insomnia due to other mental disorder: Secondary | ICD-10-CM

## 2014-12-23 DIAGNOSIS — G4733 Obstructive sleep apnea (adult) (pediatric): Secondary | ICD-10-CM

## 2014-12-23 DIAGNOSIS — F409 Phobic anxiety disorder, unspecified: Secondary | ICD-10-CM

## 2014-12-23 DIAGNOSIS — R5383 Other fatigue: Secondary | ICD-10-CM

## 2014-12-25 ENCOUNTER — Other Ambulatory Visit (HOSPITAL_COMMUNITY): Payer: Self-pay | Admitting: Respiratory Therapy

## 2015-01-01 ENCOUNTER — Other Ambulatory Visit (INDEPENDENT_AMBULATORY_CARE_PROVIDER_SITE_OTHER): Payer: Medicaid Other

## 2015-01-01 ENCOUNTER — Ambulatory Visit (INDEPENDENT_AMBULATORY_CARE_PROVIDER_SITE_OTHER): Payer: Medicaid Other | Admitting: Cardiology

## 2015-01-01 ENCOUNTER — Encounter: Payer: Self-pay | Admitting: Cardiology

## 2015-01-01 VITALS — BP 119/76 | HR 61 | Ht 66.0 in | Wt 240.0 lb

## 2015-01-01 DIAGNOSIS — I5032 Chronic diastolic (congestive) heart failure: Secondary | ICD-10-CM | POA: Diagnosis not present

## 2015-01-01 DIAGNOSIS — E785 Hyperlipidemia, unspecified: Secondary | ICD-10-CM

## 2015-01-01 DIAGNOSIS — I10 Essential (primary) hypertension: Secondary | ICD-10-CM | POA: Diagnosis not present

## 2015-01-01 NOTE — Patient Instructions (Signed)
Your physician wants you to follow-up in: 6 MONTHS WITH DR. BRANCH You will receive a reminder letter in the mail two months in advance. If you don't receive a letter, please call our office to schedule the follow-up appointment.  Your physician recommends that you continue on your current medications as directed. Please refer to the Current Medication list given to you today.  Your physician recommends that you return for lab work LIPIDS  Thank you for choosing [facility]!!

## 2015-01-01 NOTE — Progress Notes (Signed)
Patient ID: Allison Rollins, female   DOB: 29-Sep-1950, 64 y.o.   MRN: [id number]     Clinical Summary Allison Rollins is a 64 y.o.female seen today for follow up of the following medical problems.   1. Chronic diastolic heart failure - denies any SOB. Notes some LE edema at times. Limiting sodium intake. Takes fluid pill 1-2 times a week. Weights overall stable around 240 lbs.   2. HTN - compliant with meds - last visit increased her lisionpril to 40mg  daily, repeat labs showed stable Cr and K.   3. COPD - compliant with inhalers  4. Bradycardia - denies any lightheadedness or dizziness  5. Hyperlipidemia - 08/2014 TC 248 TG 246 HDL 36 LDL 163 - she is on pravastatin 40mg  daily, unclear what happened with her crestor  6. OSA screen - reffered for sleep study by nephrologist  Past Medical History  Diagnosis Date  . S/P colonoscopy 10/15/04    normal  . HTN (hypertension)   . Hypoglycemia   . Parkinson's disease   . Anxiety   . Depression   . COPD (chronic obstructive pulmonary disease)   . Asthma   . Kidney stones   . Pneumonia   . CHF (congestive heart failure)      Allergies  Allergen Reactions  . Penicillins Itching and Rash  . Sulfa Antibiotics Rash     Current Outpatient Prescriptions  Medication Sig Dispense Refill  . acetaminophen (TYLENOL) 325 MG tablet Take 650 mg by mouth 2 (two) times daily as needed for headache.    . albuterol (PROVENTIL HFA;VENTOLIN HFA) 108 (90 BASE) MCG/ACT inhaler Inhale 1-2 puffs into the lungs every 6 (six) hours as needed for wheezing. 1 Inhaler 0  . aspirin EC 81 MG tablet Take 81 mg by mouth daily.    Marland Kitchen esomeprazole (NEXIUM) 20 MG capsule Take 20 mg by mouth at bedtime.     . furosemide (LASIX) 20 MG tablet Take 20 mg by mouth 2 (two) times daily. Take 1 tab daily starting 08/01/13.    Marland Kitchen gabapentin (NEURONTIN) 300 MG capsule Take 300 mg by mouth 2 (two) times daily.    Marland Kitchen lisinopril (PRINIVIL,ZESTRIL) 40 MG tablet Take 1 tablet  (40 mg total) by mouth daily. 90 tablet 3  . metFORMIN (GLUCOPHAGE) 500 MG tablet Take 500 mg by mouth daily with breakfast.    . Omega-3 Fatty Acids (FISH OIL PO) Take 6 capsules by mouth daily.     . potassium chloride SA (K-DUR,KLOR-CON) 20 MEQ tablet Take 20 mEq by mouth daily.     . rosuvastatin (CRESTOR) 20 MG tablet Take 20 mg by mouth daily.     No current facility-administered medications for this visit.     Past Surgical History  Procedure Laterality Date  . Cholecystectomy    . Kidney stone surgery    . Colonoscopy  10/15/2004    Normal rectum/Normal colon  . Esophagogastroduodenoscopy  09/11/2010    Dysphagia likely multifactorial (possible Candida esophagitis, likely nonspecific esophageal motility disorder, and/or uncontrolled gastroesophageal reflux disease), status post empiric dilation     Allergies  Allergen Reactions  . Penicillins Itching and Rash  . Sulfa Antibiotics Rash      Family History  Problem Relation Age of Onset  . COPD Mother   . Coronary artery disease Father   . Coronary artery disease Brother   . Coronary artery disease Sister      Social History Allison Rollins reports that she quit smoking about  20 years ago. She has never used smokeless tobacco. Allison Rollins reports that she does not drink alcohol.   Review of Systems CONSTITUTIONAL: No weight loss, fever, chills, weakness or fatigue.  HEENT: Eyes: No visual loss, blurred vision, double vision or yellow sclerae.No hearing loss, sneezing, congestion, runny nose or sore throat.  SKIN: No rash or itching.  CARDIOVASCULAR: per HPI RESPIRATORY: No shortness of breath, cough or sputum.  GASTROINTESTINAL: No anorexia, nausea, vomiting or diarrhea. No abdominal pain or blood.  GENITOURINARY: No burning on urination, no polyuria NEUROLOGICAL: No headache, dizziness, syncope, paralysis, ataxia, numbness or tingling in the extremities. No change in bowel or bladder control.  MUSCULOSKELETAL: No  muscle, back pain, joint pain or stiffness.  LYMPHATICS: No enlarged nodes. No history of splenectomy.  PSYCHIATRIC: No history of depression or anxiety.  ENDOCRINOLOGIC: No reports of sweating, cold or heat intolerance. No polyuria or polydipsia.  Marland Kitchen   Physical Examination Filed Vitals:   01/01/15 1037  BP: 119/76  Pulse: 61   Filed Vitals:   01/01/15 1037  Height:  (1.676 m)  Weight: 240 lb (108.863 kg)    Gen: resting comfortably, no acute distress HEENT: no scleral icterus, pupils equal round and reactive, no palptable cervical adenopathy,  CV: RRR, no m/r/g, no JVD Resp: Clear to auscultation bilaterally GI: abdomen is soft, non-tender, non-distended, normal bowel sounds, no hepatosplenomegaly MSK: extremities are warm, no edema.  Skin: warm, no rash Neuro:  no focal deficits Psych: appropriate affect   Diagnostic Studies 11/2013 Echo Study Conclusions  - Procedure narrative: Transthoracic echocardiography. Image quality was suboptimal. The study was technically difficult, as a result of poor sound wave transmission and body habitus. - Left ventricle: The cavity size was normal. Wall thickness was increased in a pattern of mild LVH. Systolic function was low normal. The estimated ejection fraction was approximately 50%. Images were inadequate for LV wall motion assessment, but no gross regional variation was noted. Features are consistent with a pseudonormal left ventricular filling pattern, with concomitant abnormal relaxation and increased filling pressure (grade 2 diastolic dysfunction). Doppler parameters are consistent with both elevated ventricular end-diastolic filling pressure and elevated left atrial filling pressure. - Mitral valve: Mildly calcified annulus. Mildly thickened leaflets . There was mild regurgitation. - Left atrium: The atrium was moderately to severely dilated.     Assessment and Plan  1. Chronic diastolic heart failure - echo  11/2013 with LVEF 50%, grade II diastolic dysfunction - continue prn lasix.   2. HTN - at goal, continue current meds  3. Bradycardia - resolved off beta blocker, continue to follow   4. Hyperlipidemia - will repeat lipid panel. She is on pravastatin, unclear why she was changed from crestor.    F/u 6 months   Antoine Poche, M.D.

## 2015-01-02 LAB — LIPID PANEL
Chol/HDL Ratio: 3.8 ratio units (ref 0.0–4.4)
Cholesterol, Total: 183 mg/dL (ref 100–199)
HDL: 48 mg/dL (ref >39)
LDL Calculated: 105 mg/dL — ABNORMAL HIGH (ref 0–99)
Triglycerides: 149 mg/dL (ref 0–149)
VLDL Cholesterol Cal: 30 mg/dL (ref 5–40)

## 2015-01-03 ENCOUNTER — Encounter: Payer: Self-pay | Admitting: Family Medicine

## 2015-01-03 DIAGNOSIS — I5033 Acute on chronic diastolic (congestive) heart failure: Secondary | ICD-10-CM | POA: Insufficient documentation

## 2015-01-03 DIAGNOSIS — I5032 Chronic diastolic (congestive) heart failure: Secondary | ICD-10-CM | POA: Insufficient documentation

## 2015-01-05 DIAGNOSIS — Z23 Encounter for immunization: Secondary | ICD-10-CM | POA: Diagnosis not present

## 2015-01-06 ENCOUNTER — Encounter: Payer: Self-pay | Admitting: Pediatrics

## 2015-01-06 ENCOUNTER — Ambulatory Visit (INDEPENDENT_AMBULATORY_CARE_PROVIDER_SITE_OTHER): Payer: Medicare Other | Admitting: Pediatrics

## 2015-01-06 VITALS — BP 138/79 | HR 59 | Temp 97.1°F | Ht 66.0 in | Wt 241.2 lb

## 2015-01-06 DIAGNOSIS — G20 Parkinson's disease: Secondary | ICD-10-CM | POA: Diagnosis not present

## 2015-01-06 DIAGNOSIS — N393 Stress incontinence (female) (male): Secondary | ICD-10-CM | POA: Diagnosis not present

## 2015-01-06 DIAGNOSIS — R739 Hyperglycemia, unspecified: Secondary | ICD-10-CM | POA: Diagnosis not present

## 2015-01-06 DIAGNOSIS — I5032 Chronic diastolic (congestive) heart failure: Secondary | ICD-10-CM | POA: Diagnosis not present

## 2015-01-06 DIAGNOSIS — I10 Essential (primary) hypertension: Secondary | ICD-10-CM | POA: Diagnosis not present

## 2015-01-06 DIAGNOSIS — E669 Obesity, unspecified: Secondary | ICD-10-CM

## 2015-01-06 DIAGNOSIS — K219 Gastro-esophageal reflux disease without esophagitis: Secondary | ICD-10-CM | POA: Diagnosis not present

## 2015-01-06 NOTE — Assessment & Plan Note (Signed)
Pre-diabetes. Continue metformin.

## 2015-01-06 NOTE — Progress Notes (Signed)
Subjective:    Patient ID: Allison Rollins, female    DOB: March 08, 1951, 64 y.o.   MRN: [id number]  HPI: Allison Rollins is a 64 y.o. female presenting on 01/06/2015 for follow up of diastolic heart failure, hypertension, parkinson's disease.  Overall says she has been doing ok, her Dachsund and cockerspaniel mix died 01/03/2023, named Mr. Spot at 65 yo. She has been feeling sad since then when she thinks about it. She has 5 grandkids, 1 of whom lives out of state in [place].  Scheduled for sleep apnea 03/09/2015.  Feels "stress" when she does too much, such as after washing dishes or making the bed, has to take a break, take a seat. 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 Pauses while walking up a flight of stairs or she will get too out of breath.  Hurts when she walks on her feet for too long, has been told she has neuropathy, takes gabapentin.  Slow eater. Chews food a lot no other problems with swallowing now. Sometimes has burning in chest after eating, takes her nexium at night.   Husband has cirrhosis, given 3-5 months to live.  With cough or sneeze has some dribbling of urine, wears depends.   Relevant past medical, surgical, family and social history reviewed and updated as indicated. Interim medical history since our last visit reviewed. Allergies and medications reviewed and updated.   ROS: All systems negative other than whatis in HPI  Past Medical History Patient Active Problem List   Diagnosis Date Noted  . Parkinson disease 01/06/2015  . Stress incontinence 01/06/2015  . Chronic diastolic heart failure 01/03/2015  . Hypokalemia 07/28/2013  . Leukocytosis 07/02/2011  . Acute bronchitis 07/01/2011  . Obesity 07/01/2011  . Benign hypertension 07/01/2011  . Hyperglycemia 07/01/2011  . Hyperlipidemia 07/01/2011  . Dysphagia 09/08/2010  . GERD (gastroesophageal reflux disease) 09/08/2010    Current Outpatient Prescriptions  Medication Sig Dispense Refill  .  acetaminophen (TYLENOL) 325 MG tablet Take 650 mg by mouth 2 (two) times daily as needed for headache.    . albuterol (PROVENTIL HFA;VENTOLIN HFA) 108 (90 BASE) MCG/ACT inhaler Inhale 1-2 puffs into the lungs every 6 (six) hours as needed for wheezing. 1 Inhaler 0  . aspirin EC 81 MG tablet Take 81 mg by mouth daily.    . ergocalciferol (VITAMIN D2) 50000 UNITS capsule Take 50,000 Units by mouth once a week.    . esomeprazole (NEXIUM) 20 MG capsule Take 20 mg by mouth at bedtime.     . furosemide (LASIX) 40 MG tablet Take 40 mg by mouth 2 (two) times daily as needed.    . gabapentin (NEURONTIN) 300 MG capsule Take 300 mg by mouth 2 (two) times daily.    Marland Kitchen lisinopril (PRINIVIL,ZESTRIL) 40 MG tablet Take 1 tablet (40 mg total) by mouth daily. 90 tablet 3  . metFORMIN (GLUCOPHAGE) 500 MG tablet Take 500 mg by mouth daily with breakfast.    . Omega-3 Fatty Acids (FISH OIL PO) Take 6 capsules by mouth daily.     . potassium chloride SA (K-DUR,KLOR-CON) 20 MEQ tablet Take 20 mEq by mouth 2 (two) times daily between meals as needed.     . pravastatin (PRAVACHOL) 40 MG tablet Take 40 mg by mouth at bedtime.     No current facility-administered medications for this visit.       Objective:    BP 138/79 mmHg  Pulse 59  Temp(Src) 97.1 F (36.2 C) (Oral)  Ht  (1.676 m)  Wt 241 lb 3.2 oz (109.408 kg)  BMI 38.95 kg/m2  Wt Readings from Last 3 Encounters:  01/06/15 241 lb 3.2 oz (109.408 kg)  01/01/15 240 lb (108.863 kg)  09/03/14 239 lb (108.41 kg)     Gen: NAD, alert, cooperative with exam, NCAT EYES: EOMI, no scleral injection or icterus ENT:  TMs pearly gray b/l, OP without erythema LYMPH: no cervical LAD CV: NRRR, normal S1/S2, no murmur, DP pulses 2+ b/l Resp: CTABL, no wheezes, normal WOB Ext: No edema, warm Neuro: Alert and oriented, strength equal b/l UE and LE with hand grip, shoulder abduction, elbow flex/ext, knee flex/ext, hip flexion, coordination grossly normal MSK: normal  muscle bulk     Assessment & Plan:   64yoF here for follow up of below medical problems and to establish care.  GERD (gastroesophageal reflux disease) Takes nexium at night now, will switch to 30 minutes before eating as sometimes has symptoms with eating.  Benign hypertension Controlled today. Continue lisinopril, lasix.  Chronic diastolic heart failure Weighs herself regularly, just seen last week by cardiology. Breathing is at baseline per pt. Continue lasix.  Parkinson disease Not currently on controller medicines for PD. Followed by neurology. Symptoms well-controlled per pt.  Obesity BMI 38. Weight stable over last few months. Pt planning on starting walking around field outside of her house. Goal for 4-5 times a day. Drinking 3+ regular Pepsis in a day. Discussed cutting out all pepsi and sweetened drinks by next visit.  Hyperglycemia Pre-diabetes. Continue metformin.    Follow up plan: Return in about 2 months (around 03/08/2015).  Rex Kras, MD [facility] 01/06/2015, 12:38 PM

## 2015-01-06 NOTE — Patient Instructions (Addendum)
--   Goal to cut out all pepsi over the next two months  -- Go for a walk 4-5 times a day

## 2015-01-06 NOTE — Assessment & Plan Note (Signed)
Weighs herself regularly, just seen last week by cardiology. Breathing is at baseline per pt. Continue lasix.

## 2015-01-06 NOTE — Assessment & Plan Note (Addendum)
Controlled today. Continue lisinopril, lasix.

## 2015-01-06 NOTE — Assessment & Plan Note (Signed)
Not currently on controller medicines for PD. Followed by neurology. Symptoms well-controlled per pt.

## 2015-01-06 NOTE — Assessment & Plan Note (Signed)
BMI 38. Weight stable over last few months. Pt planning on starting walking around field outside of her house. Goal for 4-5 times a day. Drinking 3+ regular Pepsis in a day. Discussed cutting out all pepsi and sweetened drinks by next visit.

## 2015-01-06 NOTE — Assessment & Plan Note (Signed)
Takes nexium at night now, will switch to 30 minutes before eating as sometimes has symptoms with eating.

## 2015-01-07 ENCOUNTER — Telehealth: Payer: Self-pay | Admitting: Cardiology

## 2015-01-07 NOTE — Telephone Encounter (Signed)
Labs received, not yet resulted on. Will forward to Dr. Wyline Mood

## 2015-01-07 NOTE — Telephone Encounter (Signed)
-----   Message from Antoine Poche, MD sent at 01/07/2015 11:39 AM EDT ----- Cholesterol looks good, will continue current meds

## 2015-01-07 NOTE — Telephone Encounter (Signed)
Pt aware, routed to pcp 

## 2015-01-07 NOTE — Telephone Encounter (Signed)
Mrs. Sullenger called requesting lab results.

## 2015-01-08 ENCOUNTER — Ambulatory Visit: Payer: Medicaid Other | Admitting: Cardiology

## 2015-01-21 DIAGNOSIS — E6609 Other obesity due to excess calories: Secondary | ICD-10-CM | POA: Diagnosis not present

## 2015-01-21 DIAGNOSIS — R251 Tremor, unspecified: Secondary | ICD-10-CM | POA: Diagnosis not present

## 2015-01-21 DIAGNOSIS — R2689 Other abnormalities of gait and mobility: Secondary | ICD-10-CM | POA: Diagnosis not present

## 2015-01-21 DIAGNOSIS — G4739 Other sleep apnea: Secondary | ICD-10-CM | POA: Diagnosis not present

## 2015-03-10 ENCOUNTER — Ambulatory Visit: Payer: Medicare Other | Admitting: Pediatrics

## 2015-03-11 ENCOUNTER — Telehealth: Payer: Self-pay | Admitting: Pediatrics

## 2015-03-11 MED ORDER — PRAVASTATIN SODIUM 40 MG PO TABS: 40.0000 mg | ORAL_TABLET | Freq: Every day | ORAL | Status: DC

## 2015-03-11 NOTE — Telephone Encounter (Signed)
done

## 2015-03-12 ENCOUNTER — Ambulatory Visit (INDEPENDENT_AMBULATORY_CARE_PROVIDER_SITE_OTHER): Payer: Commercial Managed Care - HMO | Admitting: Pediatrics

## 2015-03-12 ENCOUNTER — Encounter: Payer: Self-pay | Admitting: Pediatrics

## 2015-03-12 VITALS — BP 131/78 | HR 60 | Temp 97.3°F | Ht 66.0 in | Wt 239.0 lb

## 2015-03-12 DIAGNOSIS — K219 Gastro-esophageal reflux disease without esophagitis: Secondary | ICD-10-CM

## 2015-03-12 DIAGNOSIS — I5032 Chronic diastolic (congestive) heart failure: Secondary | ICD-10-CM | POA: Diagnosis not present

## 2015-03-12 DIAGNOSIS — M545 Low back pain, unspecified: Secondary | ICD-10-CM

## 2015-03-12 DIAGNOSIS — R7303 Prediabetes: Secondary | ICD-10-CM | POA: Diagnosis not present

## 2015-03-12 DIAGNOSIS — E785 Hyperlipidemia, unspecified: Secondary | ICD-10-CM | POA: Diagnosis not present

## 2015-03-12 DIAGNOSIS — M549 Dorsalgia, unspecified: Secondary | ICD-10-CM | POA: Insufficient documentation

## 2015-03-12 DIAGNOSIS — F411 Generalized anxiety disorder: Secondary | ICD-10-CM | POA: Diagnosis not present

## 2015-03-12 DIAGNOSIS — E669 Obesity, unspecified: Secondary | ICD-10-CM | POA: Diagnosis not present

## 2015-03-12 DIAGNOSIS — Z1211 Encounter for screening for malignant neoplasm of colon: Secondary | ICD-10-CM | POA: Diagnosis not present

## 2015-03-12 MED ORDER — PRAVASTATIN SODIUM 40 MG PO TABS: 40.0000 mg | ORAL_TABLET | Freq: Every day | ORAL | Status: DC

## 2015-03-12 MED ORDER — ESCITALOPRAM OXALATE 10 MG PO TABS: 10.0000 mg | ORAL_TABLET | Freq: Every day | ORAL | Status: DC

## 2015-03-12 NOTE — Progress Notes (Signed)
Subjective:    Patient ID: Allison Rollins, female    DOB: 10-04-1950, 64 y.o.   MRN: [id number]  CC: f/u multiple med problems  HPI: Allison Rollins is a 64 y.o. female presenting on 03/12/2015 for 2 month recheck  Stressed, husband with cirrhosis, very sick, was in ICU, now back at home. Pt does much of his care. Son comes by daily at his lunch break.  Trouble falling asleep and staying asleep. Sometimes wakes up at 3am. Missed sleep study, rescheduled for Feb 13.   Feels like her nerves have been worse. Finds herself crying a lot. Mood is down, she thinks mostly due to stress of working and caring for husband. Has been on citalopram in the past. No thoughts of hurting herself or others.  Has lost some weight, trying to drink water, cutting out pepsi.  Regular stooling, no blood or dark colored stools. No swelling LE. No SOB or CP.  GAD7 Feeling nervous, anxious or on edge 3 Not being able to stop or control worrying 3 Worrying too much about different things 3 Trouble relaxing 3 Being so restless that it is hard to sit still 1 Becoming easily annoyed or irritable 3 Feeling afraid as if something awful might happen 1        Total Score 17   Relevant past medical, surgical, family and social history reviewed and updated as indicated. Interim medical history since our last visit reviewed. Allergies and medications reviewed and updated.   ROS: Per HPI unless specifically indicated above  Past Medical History Patient Active Problem List   Diagnosis Date Noted  . Generalized anxiety disorder 03/12/2015  . Pre-diabetes 03/12/2015  . Back pain 03/12/2015  . Parkinson disease (HCC) 01/06/2015  . Stress incontinence 01/06/2015  . Chronic diastolic heart failure (HCC) 01/03/2015  . Hypokalemia 07/28/2013  . Leukocytosis 07/02/2011  . Acute bronchitis 07/01/2011  . Obesity 07/01/2011  . Benign hypertension 07/01/2011  . Hyperglycemia 07/01/2011  . Hyperlipidemia 07/01/2011    . Dysphagia 09/08/2010  . GERD (gastroesophageal reflux disease) 09/08/2010    Current Outpatient Prescriptions  Medication Sig Dispense Refill  . acetaminophen (TYLENOL) 325 MG tablet Take 650 mg by mouth 2 (two) times daily as needed for headache.    . albuterol (PROVENTIL HFA;VENTOLIN HFA) 108 (90 BASE) MCG/ACT inhaler Inhale 1-2 puffs into the lungs every 6 (six) hours as needed for wheezing. 1 Inhaler 0  . aspirin EC 81 MG tablet Take 81 mg by mouth daily.    . ergocalciferol (VITAMIN D2) 50000 UNITS capsule Take 50,000 Units by mouth once a week.    . esomeprazole (NEXIUM) 20 MG capsule Take 20 mg by mouth at bedtime.     . furosemide (LASIX) 40 MG tablet Take 40 mg by mouth 2 (two) times daily as needed.    . gabapentin (NEURONTIN) 300 MG capsule Take 300 mg by mouth 2 (two) times daily.    Marland Kitchen. lisinopril (PRINIVIL,ZESTRIL) 40 MG tablet Take 1 tablet (40 mg total) by mouth daily. 90 tablet 3  . metFORMIN (GLUCOPHAGE) 500 MG tablet Take 500 mg by mouth daily with breakfast.    . Omega-3 Fatty Acids (FISH OIL PO) Take 6 capsules by mouth daily.     . potassium chloride SA (K-DUR,KLOR-CON) 20 MEQ tablet Take 20 mEq by mouth 2 (two) times daily between meals as needed.     . pravastatin (PRAVACHOL) 40 MG tablet Take 1 tablet (40 mg total) by mouth at bedtime.  30 tablet 11  . escitalopram (LEXAPRO) 10 MG tablet Take 1 tablet (10 mg total) by mouth daily. 30 tablet 3   No current facility-administered medications for this visit.       Objective:    BP 131/78 mmHg  Pulse 60  Temp(Src) 97.3 F (36.3 C) (Oral)  Ht  (1.676 m)  Wt 239 lb (108.41 kg)  BMI 38.59 kg/m2  Wt Readings from Last 3 Encounters:  03/12/15 239 lb (108.41 kg)  01/06/15 241 lb 3.2 oz (109.408 kg)  01/01/15 240 lb (108.863 kg)    Gen: NAD, alert, cooperative with exam, NCAT EYES: EOMI, no scleral injection or icterus ENT:  TMs pearly gray b/l, OP without erythema LYMPH: no cervical LAD CV: NRRR, normal  S1/S2, no murmur, distal pulses 2+ b/l Resp: CTABL, no wheezes, normal WOB Abd: +BS, soft, NTND. no guarding or organomegaly Ext: No edema, warm Neuro: Alert and oriented, strength equal b/l UE and LE, coordination grossly normal MSK: normal muscle bulk Psych: normal affect     Assessment & Plan:    MAZY was seen today for 2 month recheck.  Diagnoses and all orders for this visit:  Hyperlipidemia Continue pravastatin, cholesterol levels improving. -     pravastatin (PRAVACHOL) 40 MG tablet; Take 1 tablet (40 mg total) by mouth at bedtime.  Obesity Lost a few pounds since last visit. She thinks from poor appetite from stress though also she is trying to cut out pepsi. Continue to encourage lifestyle changes, walks outside for stress relief and alone time from care taking.  Gastroesophageal reflux disease, esophagitis presence not specified Continue nexium daily.  Generalized anxiety disorder Elevated GAD7 as above. Will start lexapro. RTC 2 weeks for recheck. -     escitalopram (LEXAPRO) 10 MG tablet; Take 1 tablet (10 mg total) by mouth daily.  Pre-diabetes Last HgA1c was 6.2 within the last 6 months. Continue lifestyle changes as above.  Bilateral low back pain without sciatica Comes and goes. Sometimes keeps her awake at night. Gave handout with back stretches and gentle exercises.   Chronic diastolic heart failure (HCC) EF normal 50-55% last in 11/2013. Not on betablocker due to low HR, 60 today. On diuretics. No new symptoms. Continue ACE-i, diurectics.  Screening for colon cancer Gave fecal occult home test.   Follow up plan: Return in about 8 weeks (around 05/07/2015).  Rex Kras, MD [facility] 03/12/2015, 2:43 PM

## 2015-03-12 NOTE — Patient Instructions (Signed)

## 2015-04-14 ENCOUNTER — Telehealth: Payer: Self-pay | Admitting: Pediatrics

## 2015-04-14 NOTE — Telephone Encounter (Signed)
Pt has 3 more refills, LMOM

## 2015-04-15 ENCOUNTER — Other Ambulatory Visit: Payer: Self-pay | Admitting: Pediatrics

## 2015-04-15 MED ORDER — GABAPENTIN 300 MG PO CAPS: 300.0000 mg | ORAL_CAPSULE | Freq: Two times a day (BID) | ORAL | Status: DC

## 2015-04-17 ENCOUNTER — Other Ambulatory Visit: Payer: Self-pay

## 2015-04-17 MED ORDER — GABAPENTIN 300 MG PO CAPS: 300.0000 mg | ORAL_CAPSULE | Freq: Two times a day (BID) | ORAL | Status: DC

## 2015-05-13 ENCOUNTER — Ambulatory Visit: Payer: Commercial Managed Care - HMO | Admitting: Pediatrics

## 2015-05-15 ENCOUNTER — Encounter: Payer: Self-pay | Admitting: Pediatrics

## 2015-05-15 ENCOUNTER — Ambulatory Visit (INDEPENDENT_AMBULATORY_CARE_PROVIDER_SITE_OTHER): Payer: Commercial Managed Care - HMO | Admitting: Pediatrics

## 2015-05-15 VITALS — BP 144/84 | HR 53 | Temp 97.0°F | Ht 66.0 in | Wt 236.0 lb

## 2015-05-15 DIAGNOSIS — Z638 Other specified problems related to primary support group: Secondary | ICD-10-CM

## 2015-05-15 DIAGNOSIS — F411 Generalized anxiety disorder: Secondary | ICD-10-CM | POA: Diagnosis not present

## 2015-05-15 DIAGNOSIS — F439 Reaction to severe stress, unspecified: Secondary | ICD-10-CM

## 2015-05-15 DIAGNOSIS — I10 Essential (primary) hypertension: Secondary | ICD-10-CM | POA: Diagnosis not present

## 2015-05-15 DIAGNOSIS — E669 Obesity, unspecified: Secondary | ICD-10-CM | POA: Diagnosis not present

## 2015-05-15 DIAGNOSIS — R7303 Prediabetes: Secondary | ICD-10-CM

## 2015-05-15 NOTE — Patient Instructions (Signed)
Goal to walk 3 days a week for 15-30 minutes outside of work  Cut out sodas  Keep eating lots of vegetables

## 2015-05-15 NOTE — Progress Notes (Signed)
    Subjective:    Patient ID: Allison Rollins, female    DOB: [birth year], 65 y.o.   MRN: [id number]  CC: Follow-up   HPI: Allison Rollins is a 65 y.o. female presenting for Follow-up  Husband discharged from hospital yesterday, has cirrhosis, hepatic encephalopathy  Pt says her mood has been ok all things considered  HTN: no HA, no vision changes  Elevated BMI: trying to get outside walking regularly   Depression screen [facility] 2/9 05/15/2015 01/06/2015  Decreased Interest 0 0  Down, Depressed, Hopeless 0 0  PHQ - 2 Score 0 0     Relevant past medical, surgical, family and social history reviewed and updated as indicated. Interim medical history since our last visit reviewed. Allergies and medications reviewed and updated.    ROS: Per HPI unless specifically indicated above  History  Smoking status  . Former Smoker -- 0.25 packs/day for 30 years  . Types: Cigarettes  . Start date: 01/04/1965  . Quit date: 01/03/1995  Smokeless tobacco  . Never Used    Past Medical History Patient Active Problem List   Diagnosis Date Noted  . Generalized anxiety disorder 03/12/2015  . Pre-diabetes 03/12/2015  . Back pain 03/12/2015  . Parkinson disease (HCC) 01/06/2015  . Stress incontinence 01/06/2015  . Chronic diastolic heart failure (HCC) 01/03/2015  . Hypokalemia 07/28/2013  . Leukocytosis 07/02/2011  . Acute bronchitis 07/01/2011  . Obesity 07/01/2011  . Benign hypertension 07/01/2011  . Hyperglycemia 07/01/2011  . Hyperlipidemia 07/01/2011  . Dysphagia 09/08/2010  . GERD (gastroesophageal reflux disease) 09/08/2010        Objective:    BP 144/84 mmHg  Pulse 53  Temp(Src) 97 F (36.1 C) (Oral)  Ht  (1.676 m)  Wt 236 lb (107.049 kg)  BMI 38.11 kg/m2  Wt Readings from Last 3 Encounters:  05/15/15 236 lb (107.049 kg)  03/12/15 239 lb (108.41 kg)  01/06/15 241 lb 3.2 oz (109.408 kg)     Gen: NAD, alert, cooperative with exam, NCAT EYES: EOMI, no scleral  injection or icterus ENT:  OP without erythema LYMPH: no cervical LAD CV: NRRR, normal S1/S2, no murmur, distal pulses 2+ b/l Resp: CTABL, no wheezes, normal WOB Abd: +BS, soft, NTND. no guarding or organomegaly Ext: No edema, warm Neuro: Alert and oriented, strength equal b/l UE and LE, coordination grossly normal MSK: normal muscle bulk     Assessment & Plan:    Allison Rollins was seen today for follow-up multiple med problems.   Diagnoses and all orders for this visit:  Benign hypertension Adequate control today, continue current medicines  Obesity Pt working on weight loss, last lost several poiunds since last visit. Encouraging life style changes.  Pre-diabetes Life style changes as above  Generalized anxiety disorder Well controlled, on lexapro, continue  Stress at home Husband recently out of the hospital, pt feels like she has adequate support Let us know if needs any help  Follow up plan: 6 mo, sooner if needed  Rex Kras, MD [facility] 05/15/2015, 3:27 PM

## 2015-06-26 ENCOUNTER — Other Ambulatory Visit: Payer: Self-pay

## 2015-06-26 MED ORDER — METFORMIN HCL 500 MG PO TABS: 500.0000 mg | ORAL_TABLET | Freq: Every day | ORAL | Status: DC

## 2015-07-25 ENCOUNTER — Encounter: Payer: Self-pay | Admitting: Family Medicine

## 2015-07-25 ENCOUNTER — Ambulatory Visit (INDEPENDENT_AMBULATORY_CARE_PROVIDER_SITE_OTHER): Payer: Commercial Managed Care - HMO | Admitting: Family Medicine

## 2015-07-25 VITALS — BP 123/69 | HR 67 | Temp 99.1°F | Ht 66.0 in | Wt 231.0 lb

## 2015-07-25 DIAGNOSIS — R05 Cough: Secondary | ICD-10-CM

## 2015-07-25 DIAGNOSIS — R059 Cough, unspecified: Secondary | ICD-10-CM

## 2015-07-25 LAB — VERITOR FLU A/B WAIVED
Influenza A: NEGATIVE
Influenza B: POSITIVE — AB

## 2015-07-25 MED ORDER — AZITHROMYCIN 250 MG PO TABS: ORAL_TABLET | ORAL | Status: DC

## 2015-07-25 MED ORDER — METHYLPREDNISOLONE ACETATE 80 MG/ML IJ SUSP
40.0000 mg | Freq: Once | INTRAMUSCULAR | Status: AC
  Administered 2015-07-25: 40 mg via INTRAMUSCULAR

## 2015-07-25 MED ORDER — METHYLPREDNISOLONE ACETATE 40 MG/ML IJ SUSP: 40.0000 mg | Freq: Once | INTRAMUSCULAR | Status: DC

## 2015-07-25 NOTE — Progress Notes (Signed)
   Subjective:    Patient ID: Allison Rollins, female    DOB: 02-May-1950, 65 y.o.   MRN: [id number]  HPI 65 year old female with cough productive of thick yellow sputum wheezing and low-grade fever. She was exposed to lots of similar symptoms earlier this week at her husband's funeral. She does have albuterol inhaler but has not used that since her symptoms developed.  Patient Active Problem List   Diagnosis Date Noted  . Generalized anxiety disorder 03/12/2015  . Pre-diabetes 03/12/2015  . Back pain 03/12/2015  . Parkinson disease (HCC) 01/06/2015  . Stress incontinence 01/06/2015  . Chronic diastolic heart failure (HCC) 01/03/2015  . Hypokalemia 07/28/2013  . Leukocytosis 07/02/2011  . Acute bronchitis 07/01/2011  . Obesity 07/01/2011  . Benign hypertension 07/01/2011  . Hyperglycemia 07/01/2011  . Hyperlipidemia 07/01/2011  . Dysphagia 09/08/2010  . GERD (gastroesophageal reflux disease) 09/08/2010   Outpatient Encounter Prescriptions as of 07/25/2015  Medication Sig  . acetaminophen (TYLENOL) 325 MG tablet Take 650 mg by mouth 2 (two) times daily as needed for headache.  . albuterol (PROVENTIL HFA;VENTOLIN HFA) 108 (90 BASE) MCG/ACT inhaler Inhale 1-2 puffs into the lungs every 6 (six) hours as needed for wheezing.  Marland Kitchen. aspirin EC 81 MG tablet Take 81 mg by mouth daily.  . ergocalciferol (VITAMIN D2) 50000 UNITS capsule Take 50,000 Units by mouth once a week.  . escitalopram (LEXAPRO) 10 MG tablet Take 1 tablet (10 mg total) by mouth daily.  . furosemide (LASIX) 40 MG tablet Take 40 mg by mouth 2 (two) times daily as needed.  . gabapentin (NEURONTIN) 300 MG capsule Take 1 capsule (300 mg total) by mouth 2 (two) times daily.  Marland Kitchen. lisinopril (PRINIVIL,ZESTRIL) 40 MG tablet Take 1 tablet (40 mg total) by mouth daily.  . metFORMIN (GLUCOPHAGE) 500 MG tablet Take 1 tablet (500 mg total) by mouth daily with breakfast. (Patient taking differently: Take 500 mg by mouth 2 (two) times daily with  a meal. )  . Omega-3 Fatty Acids (FISH OIL PO) Take 6 capsules by mouth daily.   . potassium chloride SA (K-DUR,KLOR-CON) 20 MEQ tablet Take 20 mEq by mouth 2 (two) times daily between meals as needed.   . pravastatin (PRAVACHOL) 40 MG tablet Take 1 tablet (40 mg total) by mouth at bedtime.  . [DISCONTINUED] esomeprazole (NEXIUM) 20 MG capsule Take 20 mg by mouth at bedtime.    No facility-administered encounter medications on file as of 07/25/2015.      Review of Systems  Constitutional: Positive for fatigue.  HENT: Negative.   Respiratory: Positive for cough.   Cardiovascular: Negative.   Neurological: Negative.   Psychiatric/Behavioral: Negative.        Objective:   Physical Exam  Constitutional: She is oriented to person, place, and time. She appears well-developed and well-nourished.  HENT:  Head: Normocephalic.  Eyes: Pupils are equal, round, and reactive to light.  Cardiovascular: Normal rate and regular rhythm.   Pulmonary/Chest: Effort normal. She has wheezes.  Neurological: She is alert and oriented to person, place, and time.          Assessment & Plan:  1. Cough Surprisingly, patient tested positive for flu type B. I think her symptoms and exam are more consistent with bronchitis plan Depo-Medrol 40 mg IM Z-Pak. Drink plenty of fluids.  Frederica KusterStephen M Miller MD - Veritor Flu A/B Waived - methylPREDNISolone acetate (DEPO-MEDROL) injection 40 mg; Inject 1 mL (40 mg total) into the muscle once.

## 2015-07-31 ENCOUNTER — Other Ambulatory Visit: Payer: Self-pay

## 2015-07-31 MED ORDER — NYSTATIN 100000 UNIT/GM EX CREA: 1.0000 "application " | TOPICAL_CREAM | Freq: Two times a day (BID) | CUTANEOUS | Status: DC

## 2015-07-31 NOTE — Telephone Encounter (Signed)
Last seen 07/25/15  Dr Hyacinth MeekerMiller  This med not on EPIC list  [facility]

## 2015-08-18 ENCOUNTER — Encounter: Payer: Commercial Managed Care - HMO | Admitting: *Deleted

## 2015-08-25 ENCOUNTER — Other Ambulatory Visit: Payer: Self-pay | Admitting: Pediatrics

## 2015-09-09 ENCOUNTER — Other Ambulatory Visit: Payer: Self-pay | Admitting: Cardiology

## 2015-09-16 ENCOUNTER — Ambulatory Visit (INDEPENDENT_AMBULATORY_CARE_PROVIDER_SITE_OTHER): Payer: Commercial Managed Care - HMO | Admitting: Cardiology

## 2015-09-16 ENCOUNTER — Encounter: Payer: Self-pay | Admitting: Cardiology

## 2015-09-16 VITALS — BP 129/82 | HR 48 | Ht 66.0 in | Wt 229.0 lb

## 2015-09-16 DIAGNOSIS — I10 Essential (primary) hypertension: Secondary | ICD-10-CM | POA: Diagnosis not present

## 2015-09-16 DIAGNOSIS — Z136 Encounter for screening for cardiovascular disorders: Secondary | ICD-10-CM | POA: Diagnosis not present

## 2015-09-16 DIAGNOSIS — I5032 Chronic diastolic (congestive) heart failure: Secondary | ICD-10-CM

## 2015-09-16 DIAGNOSIS — R001 Bradycardia, unspecified: Secondary | ICD-10-CM

## 2015-09-16 NOTE — Patient Instructions (Signed)

## 2015-09-16 NOTE — Progress Notes (Addendum)
Patient ID: Allison Rollins, female   DOB: [birth year], 65 y.o.   MRN: [id number]     Clinical Summary Ms. Norby is a 65 y.o.female seen today for follow up of the following medical problems  1. Chronic diastolic heart failure - denies any SOB or DOE. Occasional LE edmea, improves with prn lasix.   2. HTN - compliant with meds   3. COPD - compliant with inhalers  4. Bradycardia - chronic sinus bradycardia - denies any lightheadedness or dizziness  5. Hyperlipidemia - 12/2014: TC 183 TG 149 HDL 48 LDL 105 - she is on pravastatin 40mg  daily  6. OSA screen - has had not sleep study yet, she is not interested in scheduling at this time.    7. Chest pain  - isolated episode about 1 month ag. Pressure like feeling, better with deep breaths. 3/10 in severity. Tends to occur at rest. No other associated symptoms. No exertional symptoms. Lasts just a few minutes.   SH: has 3 grandkids, 2 sons. Husband just passed this March. Works at [employer].  Past Medical History  Diagnosis Date  . S/P colonoscopy 10/15/04    normal  . HTN (hypertension)   . Hypoglycemia   . Parkinson's disease   . Anxiety   . Depression   . COPD (chronic obstructive pulmonary disease) (HCC)   . Asthma   . Kidney stones   . Pneumonia   . CHF (congestive heart failure) (HCC)      Allergies  Allergen Reactions  . Penicillins Itching and Rash  . Sulfa Antibiotics Rash     Current Outpatient Prescriptions  Medication Sig Dispense Refill  . acetaminophen (TYLENOL) 325 MG tablet Take 650 mg by mouth 2 (two) times daily as needed for headache.    . albuterol (PROVENTIL HFA;VENTOLIN HFA) 108 (90 BASE) MCG/ACT inhaler Inhale 1-2 puffs into the lungs every 6 (six) hours as needed for wheezing. 1 Inhaler 0  . aspirin EC 81 MG tablet Take 81 mg by mouth daily.    Marland Kitchen azithromycin (ZITHROMAX Z-PAK) 250 MG tablet As directed 1 each 0  . ergocalciferol (VITAMIN D2) 50000 UNITS capsule Take 50,000 Units by  mouth once a week.    . escitalopram (LEXAPRO) 10 MG tablet Take 1 tablet (10 mg total) by mouth daily. 30 tablet 3  . furosemide (LASIX) 40 MG tablet Take 40 mg by mouth 2 (two) times daily as needed.    . gabapentin (NEURONTIN) 300 MG capsule Take 1 capsule (300 mg total) by mouth 2 (two) times daily. 60 capsule 1  . gabapentin (NEURONTIN) 300 MG capsule TAKE ONE CAPSULE BY MOUTH TWICE DAILY 60 capsule 2  . lisinopril (PRINIVIL,ZESTRIL) 40 MG tablet TAKE ONE TABLET BY MOUTH ONCE DAILY 90 tablet 0  . metFORMIN (GLUCOPHAGE) 500 MG tablet TAKE ONE TABLET BY MOUTH ONCE DAILY WITH BREAKFAST 30 tablet 2  . nystatin cream (MYCOSTATIN) Apply 1 application topically 2 (two) times daily. 30 g 1  . Omega-3 Fatty Acids (FISH OIL PO) Take 6 capsules by mouth daily.     . potassium chloride SA (K-DUR,KLOR-CON) 20 MEQ tablet Take 20 mEq by mouth 2 (two) times daily between meals as needed.     . pravastatin (PRAVACHOL) 40 MG tablet Take 1 tablet (40 mg total) by mouth at bedtime. 30 tablet 11   No current facility-administered medications for this visit.     Past Surgical History  Procedure Laterality Date  . Cholecystectomy    . Kidney  stone surgery    . Colonoscopy  10/15/2004    Normal rectum/Normal colon  . Esophagogastroduodenoscopy  09/11/2010    Dysphagia likely multifactorial (possible Candida esophagitis, likely nonspecific esophageal motility disorder, and/or uncontrolled gastroesophageal reflux disease), status post empiric dilation     Allergies  Allergen Reactions  . Penicillins Itching and Rash  . Sulfa Antibiotics Rash      Family History  Problem Relation Age of Onset  . COPD Mother   . Coronary artery disease Father   . Coronary artery disease Brother   . Coronary artery disease Sister      Social History Ms. Beissel reports that she quit smoking about 20 years ago. Her smoking use included Cigarettes. She started smoking about 50 years ago. She has a 7.5 pack-year  smoking history. She has never used smokeless tobacco. Ms. Mauro reports that she does not drink alcohol.   Review of Systems CONSTITUTIONAL: No weight loss, fever, chills, weakness or fatigue.  HEENT: Eyes: No visual loss, blurred vision, double vision or yellow sclerae.No hearing loss, sneezing, congestion, runny nose or sore throat.  SKIN: No rash or itching.  CARDIOVASCULAR: per HPI RESPIRATORY: No shortness of breath, cough or sputum.  GASTROINTESTINAL: No anorexia, nausea, vomiting or diarrhea. No abdominal pain or blood.  GENITOURINARY: No burning on urination, no polyuria NEUROLOGICAL: No headache, dizziness, syncope, paralysis, ataxia, numbness or tingling in the extremities. No change in bowel or bladder control.  MUSCULOSKELETAL: No muscle, back pain, joint pain or stiffness.  LYMPHATICS: No enlarged nodes. No history of splenectomy.  PSYCHIATRIC: No history of depression or anxiety.  ENDOCRINOLOGIC: No reports of sweating, cold or heat intolerance. No polyuria or polydipsia.  Marland Kitchen   Physical Examination Filed Vitals:   09/16/15 1303  BP: 129/82  Pulse: 48   Filed Vitals:   09/16/15 1303  Height:  (1.676 m)  Weight: 229 lb (103.874 kg)    Gen: resting comfortably, no acute distress HEENT: no scleral icterus, pupils equal round and reactive, no palptable cervical adenopathy,  CV: RRR, no m/r/g, no jvd Resp: Clear to auscultation bilaterally GI: abdomen is soft, non-tender, non-distended, normal bowel sounds, no hepatosplenomegaly MSK: extremities are warm, trace bilateral edema Skin: warm, no rash Neuro:  no focal deficits Psych: appropriate affect   Diagnostic Studies 11/2013 Echo Study Conclusions  - Procedure narrative: Transthoracic echocardiography. Image quality was suboptimal. The study was technically difficult, as a result of poor sound wave transmission and body habitus. - Left ventricle: The cavity size was normal. Wall thickness was increased  in a pattern of mild LVH. Systolic function was low normal. The estimated ejection fraction was approximately 50%. Images were inadequate for LV wall motion assessment, but no gross regional variation was noted. Features are consistent with a pseudonormal left ventricular filling pattern, with concomitant abnormal relaxation and increased filling pressure (grade 2 diastolic dysfunction). Doppler parameters are consistent with both elevated ventricular end-diastolic filling pressure and elevated left atrial filling pressure. - Mitral valve: Mildly calcified annulus. Mildly thickened leaflets . There was mild regurgitation. - Left atrium: The atrium was moderately to severely dilated.    Assessment and Plan  1. Chronic diastolic heart failure - overall controlled with prn lasix, we will continue  2. HTN - at goal, we will continue current meds  3. Bradycardia - chronic sinus bradycardia that is asymptomatic - EKG in clinic shows sinus brady at 47, no significant block - continue to monitor   4. Hyperlipidemia - continue current statin,  f/u labs with pcp  5. Chest pain - isolated fairly atypical episode, continue to monitor at this time   F/u 6 months    Antoine PocheJonathan F. Chivon Lepage, M.D.

## 2015-10-01 ENCOUNTER — Other Ambulatory Visit: Payer: Self-pay | Admitting: Pediatrics

## 2015-10-07 ENCOUNTER — Encounter: Payer: Self-pay | Admitting: Family Medicine

## 2015-10-07 ENCOUNTER — Ambulatory Visit (INDEPENDENT_AMBULATORY_CARE_PROVIDER_SITE_OTHER): Payer: Commercial Managed Care - HMO | Admitting: Family Medicine

## 2015-10-07 VITALS — BP 132/73 | HR 56 | Temp 97.7°F | Ht 66.0 in | Wt 230.4 lb

## 2015-10-07 DIAGNOSIS — J209 Acute bronchitis, unspecified: Secondary | ICD-10-CM | POA: Diagnosis not present

## 2015-10-07 MED ORDER — AZITHROMYCIN 250 MG PO TABS: ORAL_TABLET | ORAL | Status: DC

## 2015-10-07 NOTE — Progress Notes (Signed)
   Subjective:    Patient ID: Allison Rollins, female    DOB: October 15, 1950, 65 y.o.   MRN: [id number]  HPI [age] year old female with cough congestion and wheezing. She does have a history of asthma and uses albuterol inhaler as needed. Cough is productive of thick yellow sputum. She denies any pressure or pain in her sinuses. Her husband has been dead now 3 months and she still grieves over that loss. I gave her some time to talk about his memory today and hope she appreciated the time we shared together concerning body, her late husband  Patient Active Problem List   Diagnosis Date Noted  . Generalized anxiety disorder 03/12/2015  . Pre-diabetes 03/12/2015  . Back pain 03/12/2015  . Parkinson disease (HCC) 01/06/2015  . Stress incontinence 01/06/2015  . Chronic diastolic heart failure (HCC) 01/03/2015  . Hypokalemia 07/28/2013  . Leukocytosis 07/02/2011  . Acute bronchitis 07/01/2011  . Obesity 07/01/2011  . Benign hypertension 07/01/2011  . Hyperglycemia 07/01/2011  . Hyperlipidemia 07/01/2011  . Dysphagia 09/08/2010  . GERD (gastroesophageal reflux disease) 09/08/2010   Outpatient Encounter Prescriptions as of 10/07/2015  Medication Sig  . acetaminophen (TYLENOL) 500 MG tablet Take 500 mg by mouth as needed.  Marland Kitchen. albuterol (PROVENTIL HFA;VENTOLIN HFA) 108 (90 BASE) MCG/ACT inhaler Inhale 1-2 puffs into the lungs every 6 (six) hours as needed for wheezing.  Marland Kitchen. aspirin EC 81 MG tablet Take 81 mg by mouth daily.  . ergocalciferol (VITAMIN D2) 50000 UNITS capsule Take 50,000 Units by mouth once a week.  . escitalopram (LEXAPRO) 10 MG tablet Take 10 mg by mouth at bedtime.  . furosemide (LASIX) 40 MG tablet Take 40 mg by mouth 2 (two) times daily as needed.  . gabapentin (NEURONTIN) 300 MG capsule Take 1 capsule (300 mg total) by mouth 2 (two) times daily.  Marland Kitchen. lisinopril (PRINIVIL,ZESTRIL) 40 MG tablet Take 40 mg by mouth every morning.  . metFORMIN (GLUCOPHAGE) 500 MG tablet TAKE ONE TABLET BY  MOUTH ONCE DAILY WITH BREAKFAST  . nystatin cream (MYCOSTATIN) Apply 1 application topically 2 (two) times daily.  . Omega-3 Fatty Acids (FISH OIL PO) Take 6 capsules by mouth daily.   . potassium chloride SA (K-DUR,KLOR-CON) 20 MEQ tablet Take 20 mEq by mouth 2 (two) times daily between meals as needed.   . pravastatin (PRAVACHOL) 40 MG tablet Take 1 tablet (40 mg total) by mouth at bedtime.  . [DISCONTINUED] escitalopram (LEXAPRO) 10 MG tablet TAKE ONE TABLET BY MOUTH ONCE DAILY   No facility-administered encounter medications on file as of 10/07/2015.      Review of Systems  Constitutional: Negative.   HENT: Positive for congestion.   Respiratory: Positive for cough.   Cardiovascular: Negative.   Neurological: Negative.   Psychiatric/Behavioral: Negative.        Objective:   Physical Exam  Constitutional: She appears well-developed and well-nourished.  HENT:  Head: Normocephalic.  Mouth/Throat: Oropharynx is clear and moist.  Cardiovascular: Regular rhythm.   Pulmonary/Chest: Effort normal. She has wheezes.   BP 132/73 mmHg  Pulse 56  Temp(Src) 97.7 F (36.5 C) (Oral)  Ht 5\' 6"  (1.676 m)  Wt 230 lb 6.4 oz (104.509 kg)  BMI 37.21 kg/m2        Assessment & Plan:  1. Acute bronchitis, unspecified organism Treat with Z-Pak. Recommend increase fluid intake and Mucinex as expectorant.  Frederica KusterStephen M Miller MD

## 2015-10-16 ENCOUNTER — Other Ambulatory Visit: Payer: Self-pay

## 2015-10-16 NOTE — Telephone Encounter (Signed)
Last seen 10/07/15  Dr Hyacinth MeekerMiller  This med not on Patients list  Sent over from pharmacy

## 2015-10-17 ENCOUNTER — Telehealth: Payer: Self-pay | Admitting: Pediatrics

## 2015-10-17 MED ORDER — ESOMEPRAZOLE MAGNESIUM 20 MG PO PACK: 20.0000 mg | PACK | Freq: Every day | ORAL | Status: DC

## 2015-10-17 MED ORDER — ESOMEPRAZOLE MAGNESIUM 20 MG PO CPDR: 20.0000 mg | DELAYED_RELEASE_CAPSULE | Freq: Every day | ORAL | Status: DC

## 2015-10-17 NOTE — Addendum Note (Signed)
Addended by: Caryl BisBOWMAN, Christ Fullenwider M on: 10/17/2015 10:54 AM   Modules accepted: Orders

## 2015-11-13 ENCOUNTER — Encounter: Payer: Self-pay | Admitting: Pediatrics

## 2015-11-13 ENCOUNTER — Ambulatory Visit: Payer: Commercial Managed Care - HMO | Admitting: Pediatrics

## 2015-11-13 ENCOUNTER — Ambulatory Visit (INDEPENDENT_AMBULATORY_CARE_PROVIDER_SITE_OTHER): Payer: Commercial Managed Care - HMO | Admitting: Pediatrics

## 2015-11-13 VITALS — BP 127/67 | HR 64 | Temp 99.1°F | Ht 66.0 in | Wt 236.4 lb

## 2015-11-13 DIAGNOSIS — I10 Essential (primary) hypertension: Secondary | ICD-10-CM

## 2015-11-13 DIAGNOSIS — R5383 Other fatigue: Secondary | ICD-10-CM | POA: Diagnosis not present

## 2015-11-13 DIAGNOSIS — F329 Major depressive disorder, single episode, unspecified: Secondary | ICD-10-CM

## 2015-11-13 DIAGNOSIS — F4321 Adjustment disorder with depressed mood: Secondary | ICD-10-CM

## 2015-11-13 DIAGNOSIS — R7303 Prediabetes: Secondary | ICD-10-CM

## 2015-11-13 DIAGNOSIS — F32A Depression, unspecified: Secondary | ICD-10-CM

## 2015-11-13 LAB — BAYER DCA HB A1C WAIVED: HB A1C (BAYER DCA - WAIVED): 5.5 % (ref <7.0)

## 2015-11-13 MED ORDER — FLUVOXAMINE MALEATE 50 MG PO TABS: 50.0000 mg | ORAL_TABLET | Freq: Every day | ORAL | Status: DC

## 2015-11-13 NOTE — Progress Notes (Signed)
    Subjective:    Patient ID: Allison Rollins, female    DOB: 10-Mar-1951, 65 y.o.   MRN: [id number]  CC: Hyperlipidemia and Hypertension   HPI: Allison Rollins is a 65 y.o. female presenting for Hyperlipidemia and Hypertension  Husband died Aug 09, 2022 in hospice home Having a hard time Brother lives nearby, checks on her Son and family live nearby as well though are currently out of town, back tomorrow  Mood down Cries randomly Sleeping ok but doesn't feel rested Wakes up at 5am to get to work, out by [employer] to church regularly They have been very supportive  No chest pain, SOB Abd pain is gone   Depression screen [facility] 2/9 11/13/2015 10/07/2015 05/15/2015 01/06/2015  Decreased Interest 2 3 0 0  Down, Depressed, Hopeless 3 1 0 0  PHQ - 2 Score 5 4 0 0  Altered sleeping 2 1 - -  Tired, decreased energy 3 3 - -  Change in appetite 3 3 - -  Feeling bad or failure about yourself  3 3 - -  Trouble concentrating 2 1 - -  Moving slowly or fidgety/restless 1 1 - -  Suicidal thoughts 1 0 - -  PHQ-9 Score 20 16 - -  Difficult doing work/chores Very difficult - - -     Relevant past medical, surgical, family and social history reviewed and updated as indicated.  Interim medical history since our last visit reviewed. Allergies and medications reviewed and updated.  ROS: Per HPI unless specifically indicated above  History  Smoking status  . Former Smoker -- 0.25 packs/day for 30 years  . Types: Cigarettes  . Start date: 01/04/1965  . Quit date: 01/03/1995  Smokeless tobacco  . Never Used       Objective:    BP 127/67 mmHg  Pulse 64  Temp(Src) 99.1 F (37.3 C) (Oral)  Ht '5\' 6"'$  (1.676 m)  Wt 236 lb 6.4 oz (107.23 kg)  BMI 38.17 kg/m2  Wt Readings from Last 3 Encounters:  11/13/15 236 lb 6.4 oz (107.23 kg)  10/07/15 230 lb 6.4 oz (104.509 kg)  09/16/15 229 lb (103.874 kg)    Gen: NAD, alert, cooperative with exam, NCAT EYES: EOMI, no scleral injection or icterus CV:  NRRR, normal S1/S2, no murmur, distal pulses 2+ b/l Resp: CTABL, no wheezes, normal WOB Abd: +BS, soft, NTND. no guarding or organomegaly Ext: No edema, warm Neuro: Alert and oriented     Assessment & Plan:    VIA was seen today for hyperlipidemia and hypertension.  Diagnoses and all orders for this visit:  CHF, chronic diastolic Normal BP No swelling  Benign hypertension Well controlled today -     BMP8+EGFR  Depression Stop lexapro, start below -     fluvoxaMINE (LUVOX) 50 MG tablet; Take 1 tablet (50 mg total) by mouth at bedtime.  Other fatigue -     CBC with Differential/Platelet  Pre-diabetes Agrees to walking outside 3-4 times a week after work -     [employer] [place] Hb A1c Waived  Grief reaction H/o depression, now with acute grief Feels safe at home Has supportive family and friends  Follow up plan: Return in about 6 weeks (around 12/25/2015).  Assunta Found, MD [facility] 11/13/2015, 4:40 PM

## 2015-11-13 NOTE — Patient Instructions (Signed)
Take half a tab of fluvoxamine for 8 days then full dose. Stop the lexapro.

## 2015-11-14 ENCOUNTER — Telehealth: Payer: Self-pay | Admitting: Pediatrics

## 2015-11-14 LAB — CBC WITH DIFFERENTIAL/PLATELET
Basophils Absolute: 0 10*3/uL (ref 0.0–0.2)
Basos: 0 %
EOS (ABSOLUTE): 0.1 10*3/uL (ref 0.0–0.4)
Eos: 2 %
Hematocrit: 41.1 % (ref 34.0–46.6)
Hemoglobin: 13.5 g/dL (ref 11.1–15.9)
Immature Grans (Abs): 0 10*3/uL (ref 0.0–0.1)
Immature Granulocytes: 0 %
Lymphocytes Absolute: 1.6 10*3/uL (ref 0.7–3.1)
Lymphs: 21 %
MCH: 29.3 pg (ref 26.6–33.0)
MCHC: 32.8 g/dL (ref 31.5–35.7)
MCV: 89 fL (ref 79–97)
Monocytes Absolute: 0.5 10*3/uL (ref 0.1–0.9)
Monocytes: 6 %
Neutrophils Absolute: 5.6 10*3/uL (ref 1.4–7.0)
Neutrophils: 71 %
Platelets: 236 10*3/uL (ref 150–379)
RBC: 4.61 x10E6/uL (ref 3.77–5.28)
RDW: 14.6 % (ref 12.3–15.4)
WBC: 7.9 10*3/uL (ref 3.4–10.8)

## 2015-11-14 LAB — BMP8+EGFR
BUN/Creatinine Ratio: 15 (ref 12–28)
BUN: 11 mg/dL (ref 8–27)
CO2: 26 mmol/L (ref 18–29)
Calcium: 8.8 mg/dL (ref 8.7–10.3)
Chloride: 103 mmol/L (ref 96–106)
Creatinine, Ser: 0.75 mg/dL (ref 0.57–1.00)
GFR calc Af Amer: 97 mL/min/{1.73_m2} (ref >59)
GFR calc non Af Amer: 84 mL/min/{1.73_m2} (ref >59)
Glucose: 138 mg/dL — ABNORMAL HIGH (ref 65–99)
Potassium: 4.1 mmol/L (ref 3.5–5.2)
Sodium: 145 mmol/L — ABNORMAL HIGH (ref 134–144)

## 2015-11-14 NOTE — Telephone Encounter (Signed)
Pt aware.

## 2015-11-17 ENCOUNTER — Other Ambulatory Visit: Payer: Self-pay | Admitting: Family Medicine

## 2015-11-17 ENCOUNTER — Telehealth: Payer: Self-pay | Admitting: Pediatrics

## 2015-11-17 ENCOUNTER — Other Ambulatory Visit: Payer: Self-pay | Admitting: Pediatrics

## 2015-11-17 MED ORDER — CITALOPRAM HYDROBROMIDE 20 MG PO TABS
20.0000 mg | ORAL_TABLET | Freq: Every day | ORAL | 1 refills | Status: DC
Start: 2015-11-17 — End: 2016-01-13

## 2015-11-17 NOTE — Telephone Encounter (Signed)
Stop the new medicine. I sent citalopram into pharmacy. Take half tab for 8 days, then full tab.

## 2015-11-18 ENCOUNTER — Telehealth: Payer: Self-pay

## 2015-11-18 NOTE — Telephone Encounter (Signed)
Patient aware of medication change. 

## 2015-11-19 ENCOUNTER — Other Ambulatory Visit: Payer: Self-pay | Admitting: Pediatrics

## 2015-11-19 ENCOUNTER — Telehealth: Payer: Self-pay | Admitting: Pediatrics

## 2015-11-19 MED ORDER — PANTOPRAZOLE SODIUM 40 MG PO TBEC: 40.0000 mg | DELAYED_RELEASE_TABLET | Freq: Every day | ORAL | 3 refills | Status: DC

## 2015-11-19 NOTE — Telephone Encounter (Signed)
Sent in pantoprazole instead of nexium because of insurance preference.

## 2015-11-19 NOTE — Telephone Encounter (Signed)
Patient aware.

## 2015-11-19 NOTE — Telephone Encounter (Signed)
Patient aware that she can continue her current medication.

## 2015-11-25 ENCOUNTER — Other Ambulatory Visit: Payer: Self-pay | Admitting: Family Medicine

## 2015-11-30 ENCOUNTER — Other Ambulatory Visit: Payer: Self-pay | Admitting: Family Medicine

## 2015-12-10 ENCOUNTER — Other Ambulatory Visit: Payer: Self-pay | Admitting: Cardiology

## 2015-12-17 ENCOUNTER — Encounter: Payer: Commercial Managed Care - HMO | Admitting: *Deleted

## 2015-12-17 DIAGNOSIS — Z1231 Encounter for screening mammogram for malignant neoplasm of breast: Secondary | ICD-10-CM | POA: Diagnosis not present

## 2015-12-22 ENCOUNTER — Observation Stay (HOSPITAL_COMMUNITY)
Admission: EM | Admit: 2015-12-22 | Discharge: 2015-12-23 | Disposition: A | Discharge status: Home / Self Care | Payer: Commercial Managed Care - HMO | Attending: Internal Medicine | Admitting: Internal Medicine

## 2015-12-22 ENCOUNTER — Emergency Department (HOSPITAL_COMMUNITY): Payer: Commercial Managed Care - HMO

## 2015-12-22 ENCOUNTER — Ambulatory Visit (INDEPENDENT_AMBULATORY_CARE_PROVIDER_SITE_OTHER): Payer: Commercial Managed Care - HMO | Admitting: Family Medicine

## 2015-12-22 ENCOUNTER — Encounter (HOSPITAL_COMMUNITY): Payer: Self-pay | Admitting: Emergency Medicine

## 2015-12-22 VITALS — BP 127/74 | HR 58 | Temp 98.4°F | Ht 66.0 in

## 2015-12-22 DIAGNOSIS — R61 Generalized hyperhidrosis: Secondary | ICD-10-CM

## 2015-12-22 DIAGNOSIS — E1159 Type 2 diabetes mellitus with other circulatory complications: Secondary | ICD-10-CM | POA: Diagnosis present

## 2015-12-22 DIAGNOSIS — J45909 Unspecified asthma, uncomplicated: Secondary | ICD-10-CM | POA: Insufficient documentation

## 2015-12-22 DIAGNOSIS — E119 Type 2 diabetes mellitus without complications: Secondary | ICD-10-CM | POA: Diagnosis not present

## 2015-12-22 DIAGNOSIS — R0789 Other chest pain: Secondary | ICD-10-CM

## 2015-12-22 DIAGNOSIS — E785 Hyperlipidemia, unspecified: Secondary | ICD-10-CM

## 2015-12-22 DIAGNOSIS — I10 Essential (primary) hypertension: Secondary | ICD-10-CM | POA: Diagnosis not present

## 2015-12-22 DIAGNOSIS — I5033 Acute on chronic diastolic (congestive) heart failure: Secondary | ICD-10-CM | POA: Diagnosis present

## 2015-12-22 DIAGNOSIS — R079 Chest pain, unspecified: Secondary | ICD-10-CM

## 2015-12-22 DIAGNOSIS — Z79899 Other long term (current) drug therapy: Secondary | ICD-10-CM | POA: Insufficient documentation

## 2015-12-22 DIAGNOSIS — R0602 Shortness of breath: Secondary | ICD-10-CM | POA: Diagnosis not present

## 2015-12-22 DIAGNOSIS — G20 Parkinson's disease: Secondary | ICD-10-CM | POA: Diagnosis not present

## 2015-12-22 DIAGNOSIS — I5032 Chronic diastolic (congestive) heart failure: Secondary | ICD-10-CM | POA: Diagnosis present

## 2015-12-22 DIAGNOSIS — Z7984 Long term (current) use of oral hypoglycemic drugs: Secondary | ICD-10-CM | POA: Insufficient documentation

## 2015-12-22 DIAGNOSIS — I152 Hypertension secondary to endocrine disorders: Secondary | ICD-10-CM | POA: Diagnosis present

## 2015-12-22 DIAGNOSIS — I110 Hypertensive heart disease with heart failure: Secondary | ICD-10-CM | POA: Diagnosis not present

## 2015-12-22 DIAGNOSIS — Z87891 Personal history of nicotine dependence: Secondary | ICD-10-CM | POA: Insufficient documentation

## 2015-12-22 DIAGNOSIS — E782 Mixed hyperlipidemia: Secondary | ICD-10-CM | POA: Diagnosis present

## 2015-12-22 DIAGNOSIS — R42 Dizziness and giddiness: Secondary | ICD-10-CM | POA: Diagnosis not present

## 2015-12-22 DIAGNOSIS — E1169 Type 2 diabetes mellitus with other specified complication: Secondary | ICD-10-CM | POA: Diagnosis present

## 2015-12-22 DIAGNOSIS — R072 Precordial pain: Secondary | ICD-10-CM | POA: Diagnosis not present

## 2015-12-22 DIAGNOSIS — R7303 Prediabetes: Secondary | ICD-10-CM

## 2015-12-22 DIAGNOSIS — I509 Heart failure, unspecified: Secondary | ICD-10-CM | POA: Diagnosis not present

## 2015-12-22 HISTORY — DX: Type 2 diabetes mellitus without complications: E11.9

## 2015-12-22 HISTORY — DX: Chest pain, unspecified: R07.9

## 2015-12-22 LAB — HEPATIC FUNCTION PANEL
ALT: 13 U/L — ABNORMAL LOW (ref 14–54)
AST: 19 U/L (ref 15–41)
Albumin: 3.9 g/dL (ref 3.5–5.0)
Alkaline Phosphatase: 70 U/L (ref 38–126)
Bilirubin, Direct: 0.1 mg/dL (ref 0.1–0.5)
Indirect Bilirubin: 0.5 mg/dL (ref 0.3–0.9)
Total Bilirubin: 0.6 mg/dL (ref 0.3–1.2)
Total Protein: 7.2 g/dL (ref 6.5–8.1)

## 2015-12-22 LAB — BASIC METABOLIC PANEL
Anion gap: 4 — ABNORMAL LOW (ref 5–15)
BUN: 15 mg/dL (ref 6–20)
CO2: 28 mmol/L (ref 22–32)
Calcium: 8.5 mg/dL — ABNORMAL LOW (ref 8.9–10.3)
Chloride: 103 mmol/L (ref 101–111)
Creatinine, Ser: 0.97 mg/dL (ref 0.44–1.00)
GFR calc Af Amer: >60 mL/min (ref >60)
GFR calc non Af Amer: 60 mL/min — ABNORMAL LOW (ref >60)
Glucose, Bld: 95 mg/dL (ref 65–99)
Potassium: 3.3 mmol/L — ABNORMAL LOW (ref 3.5–5.1)
Sodium: 135 mmol/L (ref 135–145)

## 2015-12-22 LAB — CBC
HCT: 42.9 % (ref 36.0–46.0)
Hemoglobin: 14.3 g/dL (ref 12.0–15.0)
MCH: 29.8 pg (ref 26.0–34.0)
MCHC: 33.3 g/dL (ref 30.0–36.0)
MCV: 89.4 fL (ref 78.0–100.0)
Platelets: 243 10*3/uL (ref 150–400)
RBC: 4.8 MIL/uL (ref 3.87–5.11)
RDW: 12.8 % (ref 11.5–15.5)
WBC: 8.4 10*3/uL (ref 4.0–10.5)

## 2015-12-22 LAB — GLUCOSE, CAPILLARY: Glucose-Capillary: 102 mg/dL — ABNORMAL HIGH (ref 65–99)

## 2015-12-22 LAB — I-STAT TROPONIN, ED: Troponin i, poc: 0 ng/mL (ref 0.00–0.08)

## 2015-12-22 LAB — LIPASE, BLOOD: Lipase: 24 U/L (ref 11–51)

## 2015-12-22 LAB — TROPONIN I: Troponin I: <0.03 ng/mL (ref <0.03)

## 2015-12-22 MED ORDER — NITROGLYCERIN 0.4 MG SL SUBL: 0.4000 mg | SUBLINGUAL_TABLET | Freq: Once | SUBLINGUAL | Status: DC

## 2015-12-22 MED ORDER — PANTOPRAZOLE SODIUM 40 MG PO TBEC
40.0000 mg | DELAYED_RELEASE_TABLET | Freq: Every day | ORAL | Status: DC
  Administered 2015-12-23: 40 mg via ORAL
  Filled 2015-12-22 (×2): qty 1

## 2015-12-22 MED ORDER — ASPIRIN 81 MG PO CHEW: 162.0000 mg | CHEWABLE_TABLET | Freq: Once | ORAL | Status: DC

## 2015-12-22 MED ORDER — ASPIRIN 81 MG PO CHEW
162.0000 mg | CHEWABLE_TABLET | Freq: Once | ORAL | Status: AC
  Administered 2015-12-22: 162 mg via ORAL
  Filled 2015-12-22: qty 2

## 2015-12-22 MED ORDER — METFORMIN HCL 500 MG PO TABS
500.0000 mg | ORAL_TABLET | Freq: Every day | ORAL | Status: DC
  Administered 2015-12-23: 500 mg via ORAL
  Filled 2015-12-22: qty 1

## 2015-12-22 MED ORDER — GI COCKTAIL ~~LOC~~
30.0000 mL | Freq: Four times a day (QID) | ORAL | Status: DC | PRN
Start: 2015-12-22 — End: 2015-12-23

## 2015-12-22 MED ORDER — ACETAMINOPHEN 325 MG PO TABS: 650.0000 mg | ORAL_TABLET | ORAL | Status: DC | PRN

## 2015-12-22 MED ORDER — ONDANSETRON HCL 4 MG/2ML IJ SOLN: 4.0000 mg | Freq: Four times a day (QID) | INTRAMUSCULAR | Status: DC | PRN

## 2015-12-22 MED ORDER — ENOXAPARIN SODIUM 40 MG/0.4ML ~~LOC~~ SOLN
40.0000 mg | SUBCUTANEOUS | Status: DC
  Administered 2015-12-22: 40 mg via SUBCUTANEOUS
  Filled 2015-12-22: qty 0.4

## 2015-12-22 MED ORDER — GABAPENTIN 300 MG PO CAPS
300.0000 mg | ORAL_CAPSULE | Freq: Two times a day (BID) | ORAL | Status: DC
  Administered 2015-12-22 – 2015-12-23 (×2): 300 mg via ORAL
  Filled 2015-12-22 (×2): qty 1

## 2015-12-22 MED ORDER — OMEGA-3-ACID ETHYL ESTERS 1 G PO CAPS
2.0000 g | ORAL_CAPSULE | Freq: Every day | ORAL | Status: DC
  Administered 2015-12-23: 2 g via ORAL
  Filled 2015-12-22 (×2): qty 2

## 2015-12-22 MED ORDER — CITALOPRAM HYDROBROMIDE 20 MG PO TABS
20.0000 mg | ORAL_TABLET | Freq: Every day | ORAL | Status: DC
  Administered 2015-12-22 – 2015-12-23 (×2): 20 mg via ORAL
  Filled 2015-12-22 (×2): qty 1

## 2015-12-22 MED ORDER — LISINOPRIL 10 MG PO TABS
40.0000 mg | ORAL_TABLET | Freq: Every day | ORAL | Status: DC
  Administered 2015-12-23: 40 mg via ORAL
  Filled 2015-12-22: qty 4

## 2015-12-22 MED ORDER — PRAVASTATIN SODIUM 40 MG PO TABS
40.0000 mg | ORAL_TABLET | Freq: Every day | ORAL | Status: DC
  Administered 2015-12-22: 40 mg via ORAL
  Filled 2015-12-22: qty 1

## 2015-12-22 MED ORDER — MORPHINE SULFATE (PF) 2 MG/ML IV SOLN: 2.0000 mg | INTRAVENOUS | Status: DC | PRN

## 2015-12-22 MED ORDER — ALPRAZOLAM 0.25 MG PO TABS: 0.2500 mg | ORAL_TABLET | Freq: Two times a day (BID) | ORAL | Status: DC | PRN

## 2015-12-22 NOTE — Progress Notes (Signed)
BP 127/74   Pulse (!) 58   Temp 98.4 F (36.9 C) (Oral)   Ht 5\' 6"  (1.676 m)    Subjective:    Patient ID: Allison Rollins, female    DOB: 1951-01-31, 65 y.o.   MRN: [id number]  HPI: Allison Rollins is a 65 y.o. female presenting on 12/22/2015 for Chest Pain   HPI Chest pain and diaphoresis Patient comes in today with chest pain that is substernal and sharp and tight pressure. She also says that when it started she felt dizzy and diaphoretic and sweats and pale and felt like she was about ready to pass out. This occurred about 2 hours ago. She has been having intermittent chest pains for the past week but all of the other symptoms just happened today. When initially checked her blood pressure then it was very elevated but here in the office it is down to 127/74. 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 When this chest pain started she was pushing a large cart had a lot of close and it and then she was pain of the close so she was physically active. She does say that she started a new medications for anxiety a week ago and does not know the could be due to this. She has been more anxious recently because of her husband's passing. She has a personal history of smoking in the past, prediabetes, hyperlipidemia that is controlled, hypertension that is controlled. She also has an extensive family history of myocardial infarctions including a brother and a sister both had one in their 4850s and her sister died from it in her 8350s.she is also been experiencing palpitations or flutters during this time intermittently .  Relevant past medical, surgical, family and social history reviewed and updated as indicated. Interim medical history since our last visit reviewed. Allergies and medications reviewed and updated.  Review of Systems  Constitutional: Positive for diaphoresis. Negative for chills and fever.  HENT: Negative for congestion, ear discharge and ear pain.   Eyes: Negative for  redness and visual disturbance.  Respiratory: Positive for chest tightness and shortness of breath. Negative for cough.   Cardiovascular: Positive for chest pain and palpitations. Negative for leg swelling.  Endocrine: Positive for heat intolerance.  Genitourinary: Negative for difficulty urinating and dysuria.  Musculoskeletal: Negative for back pain and gait problem.  Skin: Negative for rash.  Neurological: Negative for light-headedness and headaches.  Psychiatric/Behavioral: Negative for agitation and behavioral problems.  All other systems reviewed and are negative.   Per HPI unless specifically indicated above     Medication List       Accurate as of 12/22/15 12:49 PM. Always use your most recent med list.          acetaminophen 500 MG tablet Commonly known as:  TYLENOL Take 500 mg by mouth as needed.   albuterol 108 (90 Base) MCG/ACT inhaler Commonly known as:  PROVENTIL HFA;VENTOLIN HFA Inhale 1-2 puffs into the lungs every 6 (six) hours as needed for wheezing.   aspirin EC 81 MG tablet Take 81 mg by mouth daily.   citalopram 20 MG tablet Commonly known as:  CELEXA Take 1 tablet (20 mg total) by mouth daily.   ergocalciferol 50000 units capsule Commonly known as:  VITAMIN D2 Take 50,000 Units by mouth once a week.   escitalopram 10 MG tablet Commonly known as:  LEXAPRO Take 10 mg by mouth at bedtime.   FISH OIL PO  Take 6 capsules by mouth daily.   furosemide 40 MG tablet Commonly known as:  LASIX Take 40 mg by mouth 2 (two) times daily as needed.   gabapentin 300 MG capsule Commonly known as:  NEURONTIN TAKE ONE CAPSULE BY MOUTH TWICE DAILY   lisinopril 40 MG tablet Commonly known as:  PRINIVIL,ZESTRIL TAKE ONE TABLET BY MOUTH ONCE DAILY   metFORMIN 500 MG tablet Commonly known as:  GLUCOPHAGE TAKE ONE TABLET BY MOUTH ONCE DAILY WITH BREAKFAST   pantoprazole 40 MG tablet Commonly known as:  PROTONIX Take 1 tablet (40 mg total) by mouth  daily.   pravastatin 40 MG tablet Commonly known as:  PRAVACHOL Take 1 tablet (40 mg total) by mouth at bedtime.          Objective:    BP 127/74   Pulse (!) 58   Temp 98.4 F (36.9 C) (Oral)   Ht 5\' 6"  (1.676 m)   Wt Readings from Last 3 Encounters:  11/13/15 236 lb 6.4 oz (107.2 kg)  10/07/15 230 lb 6.4 oz (104.5 kg)  09/16/15 229 lb (103.9 kg)    Physical Exam  Constitutional: She is oriented to person, place, and time. She appears well-developed and well-nourished. No distress.  Eyes: Conjunctivae are normal.  Cardiovascular: Normal rate, regular rhythm, normal heart sounds and intact distal pulses.   No murmur heard. Pulmonary/Chest: Effort normal and breath sounds normal. No respiratory distress. She has no wheezes. She has no rales.  No reproducible chest pain  Abdominal: Soft. Bowel sounds are normal. There is no tenderness. There is no rebound.  Musculoskeletal: Normal range of motion. She exhibits no edema or tenderness.  Neurological: She is alert and oriented to person, place, and time. Coordination normal.  Skin: Skin is warm and dry. No rash noted. She is not diaphoretic.  Psychiatric: She has a normal mood and affect. Her behavior is normal.  Nursing note and vitals reviewed.   EKG: EKG shows slight bradycardia with a heart rate of 57. EKG shows a PVC. EKG also shows QT prolongation. Otherwise appears unchanged in sinus rhythm from previous.    Assessment & Plan:   Problem List Items Addressed This Visit    None    Visit Diagnoses    Other chest pain    -  Primary   Atypical chest pain with diaphoresis, family history, prediabetes, hypertension, hyperlipidemia, enough risk factors that I want her to be evaluated at the ED   Relevant Medications   aspirin chewable tablet 162 mg (Start on 12/22/2015  1:00 PM)   nitroGLYCERIN (NITROSTAT) SL tablet 0.4 mg (Start on 12/22/2015  1:00 PM)   Other Relevant Orders   EKG 12-Lead (Completed)   Diaphoresis        Call EMS and have her transported, give her the rest of an aspirin, she had 81 mg this a.m., also give nitroglycerin   Relevant Medications   aspirin chewable tablet 162 mg (Start on 12/22/2015  1:00 PM)   nitroGLYCERIN (NITROSTAT) SL tablet 0.4 mg (Start on 12/22/2015  1:00 PM)       Follow up plan: Return if symptoms worsen or fail to improve.  Counseling provided for all of the vaccine components Orders Placed This Encounter  Procedures  . EKG 12-Lead    Arville Care, MD [facility] 12/22/2015, 12:49 PM

## 2015-12-22 NOTE — Consult Note (Signed)
Primary Physician: Primary Cardiologist:  Branch  Asked to see re CP  HPI: Ptis a 65 yo with history of HTN, chronic diastoilc CHF, COPD bradycardia, HL (LDL 105), OSA and CP   Came to [facility] ER with CP while at work  Lifting heavy load  Got SOB, N/ diaphoretic.  Near syncope   Went To PCP  EKG done  NTG given  Last 15 min.   Has had intermitt CP over past week  Worse today   Currently pain free         Past Medical History:  Diagnosis Date  . Anxiety   . Asthma   . CHF (congestive heart failure) (HCC)   . COPD (chronic obstructive pulmonary disease) (HCC)   . Depression   . HTN (hypertension)   . Hypoglycemia   . Kidney stones   . Parkinson's disease   . Pneumonia   . S/P colonoscopy 10/15/04   normal     (Not in a hospital admission)      Infusions:    Allergies  Allergen Reactions  . Penicillins Itching and Rash    Has patient had a PCN reaction causing immediate rash, facial/tongue/throat swelling, SOB or lightheadedness with hypotension: no Has patient had a PCN reaction causing severe rash involving mucus membranes or skin necrosis: no Has patient had a PCN reaction that required hospitalization: no Has patient had a PCN reaction occurring within the last 10 years: no If all of the above answers are "NO", then may proceed with Cephalosporin use.   . Sulfa Antibiotics Rash    Social History   Social History  . Marital status: Widowed    Spouse name: N/A  . Number of children: 2  . Years of education: N/A   Occupational History  . disabled    Social History Main Topics  . Smoking status: Former Smoker    Packs/day: 0.25    Years: 30.00    Types: Cigarettes    Start date: 01/04/1965    Quit date: 01/03/1995  . Smokeless tobacco: Never Used  . Alcohol use No  . Drug use: No  . Sexual activity: Not on file   Other Topics Concern  . Not on file   Social History Narrative  . No narrative on file    Family History  Problem Relation Age  of Onset  . Coronary artery disease Father   . COPD Mother   . Coronary artery disease Brother   . Coronary artery disease Sister     REVIEW OF SYSTEMS:  All systems reviewed  Negative to the above problem except as noted above.    PHYSICAL EXAM: Vitals:   12/22/15 1405 12/22/15 1414  BP: 160/87   Pulse: 63 (!) 53  Resp: 18 24  Temp: 97.8 F (36.6 C)     No intake or output data in the 24 hours ending 12/22/15 1647  General:  Well appearing. No respiratory difficulty HEENT: normal Neck: supple. no JVD. Carotids 2+ bilat; no bruits. No lymphadenopathy or thryomegaly appreciated. Cor: PMI nondisplaced. Regular rate & rhythm. No rubs, gallops or murmurs. Lungs: clear Abdomen: soft, nontender, nondistended. No hepatosplenomegaly. No bruits or masses. Good bowel sounds. Extremities: no cyanosis, clubbing, rash, edema Neuro: alert & oriented x 3, cranial nerves grossly intact. moves all 4 extremities w/o difficulty. Affect pleasant.  ECG:  Sinus bradycardia  56 bpm    Results for orders placed or performed during the hospital encounter of 12/22/15 (from the past  24 hour(s))  I-stat troponin, ED     Status: None   Collection Time: 12/22/15  2:14 PM  Result Value Ref Range   Troponin i, poc 0.00 0.00 - 0.08 ng/mL   Comment 3          Basic metabolic panel     Status: Abnormal   Collection Time: 12/22/15  2:17 PM  Result Value Ref Range   Sodium 135 135 - 145 mmol/L   Potassium 3.3 (L) 3.5 - 5.1 mmol/L   Chloride 103 101 - 111 mmol/L   CO2 28 22 - 32 mmol/L   Glucose, Bld 95 65 - 99 mg/dL   BUN 15 6 - 20 mg/dL   Creatinine, Ser 6.57 0.44 - 1.00 mg/dL   Calcium 8.5 (L) 8.9 - 10.3 mg/dL   GFR calc non Af Amer 60 (L) >60 mL/min   GFR calc Af Amer >60 >60 mL/min   Anion gap 4 (L) 5 - 15  CBC     Status: None   Collection Time: 12/22/15  2:17 PM  Result Value Ref Range   WBC 8.4 4.0 - 10.5 K/uL   RBC 4.80 3.87 - 5.11 MIL/uL   Hemoglobin 14.3 12.0 - 15.0 g/dL   HCT 84.6  96.2 - 95.2 %   MCV 89.4 78.0 - 100.0 fL   MCH 29.8 26.0 - 34.0 pg   MCHC 33.3 30.0 - 36.0 g/dL   RDW 84.1 32.4 - 40.1 %   Platelets 243 150 - 400 K/uL   Dg Chest 2 View  Result Date: 12/22/2015 CLINICAL DATA:  Chest pain, dizziness and shortness of breath since 10 a.m. this morning. EXAM: CHEST  2 VIEW COMPARISON:  Chest x-ray 05/08/2014 FINDINGS: The heart is mildly enlarged but stable. There is mild tortuosity of the thoracic aorta. The pulmonary hila appear normal. Mild hyperinflation. Streaky bibasilar atelectasis but no infiltrates or effusions. The bony thorax is intact. IMPRESSION: Mild stable cardiac enlargement. Hyperinflation and streaky basilar atelectasis but no infiltrates or effusions. Electronically Signed   By: Rudie Meyer M.D.   On: 12/22/2015 15:24     ASSESSMENT:  Pt is a 65 yo with no known CAD  Presents with CP and diaphoreiss   Currently pain free, EKG negative  Has risk factors for CAD  Concerning  I would recomm admit, r/o for MI  If negative plan on Lexiscan myovue in AM (had problem remotely with chemical stress test (before 2003)  Volume status looks OK Follow BP

## 2015-12-22 NOTE — ED Provider Notes (Signed)
[facility]EMERGENCY DEPT Provider Note   CSN: [id number] Arrival date & time: 12/22/15  1346     History   Chief Complaint Chief Complaint  Patient presents with  . Chest Pain    HPI Allison Rollins is a 65 y.o. female.  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 Associated with SOB, nausea, diaphoresis and near syncope.  Pain did not radiate.  Her son took her to her PCP's office who did and EKG and gave her nitroglycerin.  Pain is resolved now. Lasted maybe 15-20 minutes.  Has had similar episodes of pain in past. Denies CAD or stents. Reports negative stress test 8-9 years ago.  No abdominal pain or back pain.  No leg pain.    The history is provided by the patient.  Chest Pain   Associated symptoms include diaphoresis, dizziness, nausea and shortness of breath. Pertinent negatives include no abdominal pain, no back pain, no cough, no fever, no headaches, no palpitations, no vomiting and no weakness.    Past Medical History:  Diagnosis Date  . Anxiety   . Asthma   . CHF (congestive heart failure) (HCC)   . COPD (chronic obstructive pulmonary disease) (HCC)   . Depression   . HTN (hypertension)   . Hypoglycemia   . Kidney stones   . Parkinson's disease   . Pneumonia   . S/P colonoscopy 10/15/04   normal    Patient Active Problem List   Diagnosis Date Noted  . Generalized anxiety disorder 03/12/2015  . Pre-diabetes 03/12/2015  . Back pain 03/12/2015  . Parkinson disease (HCC) 01/06/2015  . Stress incontinence 01/06/2015  . Chronic diastolic heart failure (HCC) 01/03/2015  . Hypokalemia 07/28/2013  . Leukocytosis 07/02/2011  . Obesity 07/01/2011  . Benign hypertension 07/01/2011  . Hyperglycemia 07/01/2011  . Hyperlipidemia 07/01/2011  . Dysphagia 09/08/2010  . GERD (gastroesophageal reflux disease) 09/08/2010    Past Surgical History:  Procedure Laterality Date  . CHOLECYSTECTOMY    . COLONOSCOPY  10/15/2004   Normal rectum/Normal colon  . ESOPHAGOGASTRODUODENOSCOPY  09/11/2010   Dysphagia likely multifactorial (possible Candida esophagitis, likely nonspecific esophageal motility disorder, and/or uncontrolled gastroesophageal reflux disease), status post empiric dilation  . KIDNEY STONE SURGERY      OB History    No data available       Home Medications    Prior to Admission medications   Medication Sig Start Date End Date Taking? Authorizing Provider  acetaminophen (TYLENOL) 500 MG tablet Take 500 mg by mouth as needed for moderate pain.    Yes Historical Provider, MD  albuterol (PROVENTIL HFA;VENTOLIN HFA) 108 (90 BASE) MCG/ACT inhaler Inhale 1-2 puffs into the lungs every 6 (six) hours as needed for wheezing. 01/12/13  Yes Donnetta HutchingBrian Cook, MD  aspirin EC 81 MG tablet Take 81 mg by mouth daily.   Yes Historical Provider, MD  citalopram (CELEXA) 20 MG tablet Take 1 tablet (20 mg total) by mouth daily. 11/17/15  Yes Johna Sheriffarol L Vincent, MD  ergocalciferol (VITAMIN D2) 50000 UNITS capsule Take 50,000 Units by mouth once a week.   Yes Historical Provider, MD  furosemide (LASIX) 40 MG tablet Take 40 mg by mouth 2 (two) times daily as needed for fluid.    Yes Historical Provider, MD  gabapentin (NEURONTIN) 300 MG capsule TAKE ONE CAPSULE BY MOUTH TWICE DAILY 11/25/15  Yes Johna Sheriffarol L Vincent, MD  lisinopril (PRINIVIL,ZESTRIL) 40 MG tablet TAKE ONE TABLET BY MOUTH ONCE DAILY 12/10/15  Yes Antoine Poche, MD  metFORMIN (GLUCOPHAGE) 500 MG tablet TAKE ONE TABLET BY MOUTH ONCE DAILY WITH BREAKFAST 12/01/15  Yes [name], MD  Omega-3 Fatty Acids (FISH OIL PO) Take 6 capsules by mouth daily.    Yes Historical Provider, MD  pantoprazole (PROTONIX) 40 MG tablet Take 1 tablet (40 mg total) by mouth daily. 11/19/15  Yes [name], MD  pravastatin (PRAVACHOL) 40 MG tablet Take 1 tablet (40 mg total) by mouth at bedtime. 03/12/15  Yes [name], MD    Family History Family History  Problem Relation Age of  Onset  . Coronary artery disease Father   . COPD Mother   . Coronary artery disease Brother   . Coronary artery disease Sister     Social History Social History  Substance Use Topics  . Smoking status: Former Smoker    Packs/day: 0.25    Years: 30.00    Types: Cigarettes    Start date: 01/04/1965    Quit date: 01/03/1995  . Smokeless tobacco: Never Used  . Alcohol use No     Allergies   Penicillins and Sulfa antibiotics   Review of Systems Review of Systems  Constitutional: Positive for diaphoresis. Negative for activity change, appetite change and fever.  HENT: Negative for congestion.   Eyes: Negative for visual disturbance.  Respiratory: Positive for chest tightness and shortness of breath. Negative for cough.   Cardiovascular: Positive for chest pain. Negative for palpitations.  Gastrointestinal: Positive for nausea. Negative for abdominal pain and vomiting.  Genitourinary: Negative for dysuria, hematuria, vaginal bleeding and vaginal discharge.  Musculoskeletal: Negative for back pain and myalgias.  Neurological: Positive for dizziness. Negative for weakness and headaches.  A complete 10 system review of systems was obtained and all systems are negative except as noted in the HPI and PMH.    Physical Exam Updated Vital Signs BP 160/87 (BP Location: Right Arm)   Pulse (!) 53   Temp 97.8 F (36.6 C) (Oral)   Resp 24   Ht 5\' 7"  (1.702 m)   Wt 247 lb (112 kg)   SpO2 97%   BMI 38.69 kg/m   Physical Exam  Constitutional: She is oriented to person, place, and time. She appears well-developed and well-nourished. No distress.  HENT:  Head: Normocephalic and atraumatic.  Mouth/Throat: Oropharynx is clear and moist. No oropharyngeal exudate.  Eyes: Conjunctivae and EOM are normal. Pupils are equal, round, and reactive to light.  Neck: Normal range of motion. Neck supple.  No meningismus.  Cardiovascular: Normal rate, regular rhythm, normal heart sounds and intact  distal pulses.   No murmur heard. Pulmonary/Chest: Effort normal and breath sounds normal. No respiratory distress. She exhibits tenderness.  Central tenderness but does not reproduce pain  Abdominal: Soft. There is no tenderness. There is no rebound and no guarding.  Musculoskeletal: Normal range of motion. She exhibits no edema or tenderness.  Neurological: She is alert and oriented to person, place, and time. No cranial nerve deficit. She exhibits normal muscle tone. Coordination normal.  No ataxia on finger to nose bilaterally. No pronator drift. 5/5 strength throughout. CN 2-12 intact.Equal grip strength. Sensation intact.   Skin: Skin is warm.  Psychiatric: She has a normal mood and affect. Her behavior is normal.  Nursing note and vitals reviewed.    ED Treatments / Results  Labs (all labs ordered are listed, but only abnormal results are displayed) Labs Reviewed  BASIC METABOLIC PANEL - Abnormal; Notable for  the following:       Result Value   Potassium 3.3 (*)    Calcium 8.5 (*)    GFR calc non Af Amer 60 (*)    Anion gap 4 (*)    All other components within normal limits  HEPATIC FUNCTION PANEL - Abnormal; Notable for the following:    ALT 13 (*)    All other components within normal limits  GLUCOSE, CAPILLARY - Abnormal; Notable for the following:    Glucose-Capillary 102 (*)    All other components within normal limits  CBC  LIPASE, BLOOD  TROPONIN I  TROPONIN I  I-STAT TROPOININ, ED    EKG  EKG Interpretation  Date/Time:  Monday December 22 2015 14:05:31 EDT Ventricular Rate:  51 PR Interval:    QRS Duration: 93 QT Interval:  518 QTC Calculation: 478 R Axis:   77 Text Interpretation:  Sinus rhythm Low voltage, precordial leads No significant change was found Confirmed by Manus Gunning  MD, Sebrena Engh [phone]) on 12/22/2015 3:01:24 PM       Radiology Dg Chest 2 View  Result Date: 12/22/2015 CLINICAL DATA:  Chest pain, dizziness and shortness of breath since 10  a.m. this morning. EXAM: CHEST  2 VIEW COMPARISON:  Chest x-ray 05/08/2014 FINDINGS: The heart is mildly enlarged but stable. There is mild tortuosity of the thoracic aorta. The pulmonary hila appear normal. Mild hyperinflation. Streaky bibasilar atelectasis but no infiltrates or effusions. The bony thorax is intact. IMPRESSION: Mild stable cardiac enlargement. Hyperinflation and streaky basilar atelectasis but no infiltrates or effusions. Electronically Signed   By: Rudie Meyer M.D.   On: 12/22/2015 15:24    Procedures Procedures (including critical care time)  Medications Ordered in ED Medications - No data to display   Initial Impression / Assessment and Plan / ED Course  I have reviewed the triage vital signs and the nursing notes.  Pertinent labs & imaging results that were available during my care of the patient were reviewed by me and considered in my medical decision making (see chart for details).  Clinical Course   Central chest pain, now resolved.  EKG unchanged.  Troponin negative. HEART score 4. Chest pain has resolved. CXR negative. Doubt PE, doubt aortic dissection.  D/w Dr. Tenny Craw who will evaluate and agrees with hospitalist admission for rule out. D/w Dr. Arlean Hopping  Final Clinical Impressions(s) / ED Diagnoses   Final diagnoses:  Nonspecific chest pain    New Prescriptions New Prescriptions   No medications on file     Glynn Octave, MD 12/23/15 [id number]

## 2015-12-22 NOTE — ED Triage Notes (Signed)
Pt was given 1 nitro SL and 324 mg aspirin prior to arrival.

## 2015-12-22 NOTE — H&P (Signed)
Marland Kitchen   D/W Dietrich Pates, cardiology.     OBS / Telemetry  [facility] History and Physical  Allison Rollins AVW:[id number] DOB: 1950/09/11 DOA: 12/22/2015  Referring physician: Dr Manus Gunning PCP: [name], MD   Chief Complaint: Chest pain  HPI: Allison Rollins is a 65 y.o. female with history of borderline DM, HTN, Parkinson Disease, asthma, COPD who works at [employer]. While at work today she was reaching up to get some laundry down in a pt's room and suddenly started to "black out", then she recovered and experienced anterior chest pain w diaphoresis, "smothering" sensation and dizziness. The episode lasted about 15 min. Her son picked her up and took her to her PCP's office, who sent her to ED.  In the ED EKG showed NSR, no acute changes, bradycardia 55 bpm.  No chest pain now.  CXR shows hyperinflation.  Last stress test was 8-9 yrs ago, hasn't had heart cath. Has been seen by cardiology, rec's pending.    Pt's husband died from liver cancer in 28-Jul-2022 this year.  She has two sons living and one is here with her now.  She denies recent problems with angina, CP, orthopnea, PND, cough or hemoptysis.  No abd pain/ n/v/d, no URI symptoms.  No hx MI.   Chart review: Mar '13 - acute asthmatic bronchitis, seroid-induced hyperglycemia, GERD, HL, MO Apr '15 - acute viral gastroenteritis, N/V/diarrhea.  HTN, obesity.  Resolved.   ROS  denies CP  no joint pain   no HA  no blurry vision  no rash  no diarrhea  no nausea/ vomiting  no dysuria  no difficulty voiding  no change in urine color    Past Medical History  Past Medical History:  Diagnosis Date  . Anxiety   . Asthma   . CHF (congestive heart failure) (HCC)   . COPD (chronic obstructive pulmonary disease) (HCC)   . Depression   . HTN (hypertension)   . Hypoglycemia   . Kidney stones   . Parkinson's disease   . Pneumonia   . S/P colonoscopy 10/15/04   normal   Past Surgical History  Past Surgical History:   Procedure Laterality Date  . CHOLECYSTECTOMY    . COLONOSCOPY  10/15/2004   Normal rectum/Normal colon  . ESOPHAGOGASTRODUODENOSCOPY  09/11/2010   Dysphagia likely multifactorial (possible Candida esophagitis, likely nonspecific esophageal motility disorder, and/or uncontrolled gastroesophageal reflux disease), status post empiric dilation  . KIDNEY STONE SURGERY     Family History  Family History  Problem Relation Age of Onset  . Coronary artery disease Father   . COPD Mother   . Coronary artery disease Brother   . Coronary artery disease Sister    Social History  reports that she quit smoking about 20 years ago. Her smoking use included Cigarettes. She started smoking about 50 years ago. She has a 7.50 pack-year smoking history. She has never used smokeless tobacco. She reports that she does not drink alcohol or use drugs. Allergies  Allergies  Allergen Reactions  . Penicillins Itching and Rash    Has patient had a PCN reaction causing immediate rash, facial/tongue/throat swelling, SOB or lightheadedness with hypotension: no Has patient had a PCN reaction causing severe rash involving mucus membranes or skin necrosis: no Has patient had a PCN reaction that required hospitalization: no Has patient had a PCN reaction occurring within the last 10 years: no If all of the above answers are "NO", then may proceed with Cephalosporin use.   Marland Kitchen  Sulfa Antibiotics Rash   Home medications Prior to Admission medications   Medication Sig Start Date End Date Taking? Authorizing Provider  acetaminophen (TYLENOL) 500 MG tablet Take 500 mg by mouth as needed for moderate pain.    Yes Historical Provider, MD  albuterol (PROVENTIL HFA;VENTOLIN HFA) 108 (90 BASE) MCG/ACT inhaler Inhale 1-2 puffs into the lungs every 6 (six) hours as needed for wheezing. 01/12/13  Yes [name], MD  aspirin EC 81 MG tablet Take 81 mg by mouth daily.   Yes Historical Provider, MD  citalopram (CELEXA) 20 MG tablet Take  1 tablet (20 mg total) by mouth daily. 11/17/15  Yes [name], MD  ergocalciferol (VITAMIN D2) 50000 UNITS capsule Take 50,000 Units by mouth once a week.   Yes Historical Provider, MD  furosemide (LASIX) 40 MG tablet Take 40 mg by mouth 2 (two) times daily as needed for fluid.    Yes Historical Provider, MD  gabapentin (NEURONTIN) 300 MG capsule TAKE ONE CAPSULE BY MOUTH TWICE DAILY 11/25/15  Yes [name], MD  lisinopril (PRINIVIL,ZESTRIL) 40 MG tablet TAKE ONE TABLET BY MOUTH ONCE DAILY 12/10/15  Yes Antoine Poche, MD  metFORMIN (GLUCOPHAGE) 500 MG tablet TAKE ONE TABLET BY MOUTH ONCE DAILY WITH BREAKFAST 12/01/15  Yes [name], MD  Omega-3 Fatty Acids (FISH OIL PO) Take 6 capsules by mouth daily.    Yes Historical Provider, MD  pantoprazole (PROTONIX) 40 MG tablet Take 1 tablet (40 mg total) by mouth daily. 11/19/15  Yes [name], MD  pravastatin (PRAVACHOL) 40 MG tablet Take 1 tablet (40 mg total) by mouth at bedtime. 03/12/15  Yes [name], MD   Liver Function Tests  Recent Labs Lab 12/22/15 1417  AST 19  ALT 13*  ALKPHOS 70  BILITOT 0.6  PROT 7.2  ALBUMIN 3.9    Recent Labs Lab 12/22/15 1417  LIPASE 24   CBC  Recent Labs Lab 12/22/15 1417  WBC 8.4  HGB 14.3  HCT 42.9  MCV 89.4  PLT 243   Basic Metabolic Panel  Recent Labs Lab 12/22/15 1417  NA 135  K 3.3*  CL 103  CO2 28  GLUCOSE 95  BUN 15  CREATININE 0.97  CALCIUM 8.5*     Vitals:   12/22/15 1403 12/22/15 1405 12/22/15 1406 12/22/15 1414  BP: 160/87 160/87    Pulse:  63  (!) 53  Resp:  18  24  Temp:  97.8 F (36.6 C)    TempSrc:  Oral    SpO2:  97%  97%  Weight:   112 kg (247 lb)   Height:   5\' 7"  (1.702 m)    Exam: Gen obese, pleasant, no distress, calm No rash, cyanosis or gangrene Sclera anicteric, throat clear  No jvd or bruit Chest clear bilat RRR no MRG Abd soft ntnd no mass or ascites +bs, obese GU defer MS no joint effusions or deformity Ext  1+ bilat pretib edema / no wounds or ulcers Neuro is alert, Ox 3 , nf  Na 135 Creat 0.97  Alb 3.9  WBC 8k Hb 14  Trop 0.00  Assessment: 1.  Chest pain - one episode, no hx CAD, last stress test 8-9 yrs ago.  Cardiology to see, rec's pending.  Admit OBS, tele, serial trop, EKG.   2.  Diast HF - takes lasix prn 3.  DM2 on oral meds 4.  HL 5.  GERD 6.  HTN on acei/  lasix  Plan - as above     Maree KrabbeSCHERTZ,Kamal Jurgens D [facility] Pager [phone]  Cell [phone]  If 7PM-7AM, please contact night-coverage [email or web address] Password [email or web address] 12/22/2015, 5:37 PM

## 2015-12-22 NOTE — ED Triage Notes (Signed)
Pt [facility] c/o of new onset chest pain following exertion at work. Pt stated she had a feeling of burning/pressure in her sternal region. Pt states the pain did not radiate anywhere. Pt is alert and oriented x4.

## 2015-12-22 NOTE — ED Notes (Signed)
Pt stated her chest pain resolved.

## 2015-12-23 ENCOUNTER — Observation Stay (HOSPITAL_COMMUNITY): Payer: Commercial Managed Care - HMO

## 2015-12-23 ENCOUNTER — Observation Stay (HOSPITAL_BASED_OUTPATIENT_CLINIC_OR_DEPARTMENT_OTHER): Payer: Commercial Managed Care - HMO

## 2015-12-23 ENCOUNTER — Encounter (HOSPITAL_COMMUNITY): Payer: Self-pay

## 2015-12-23 DIAGNOSIS — I10 Essential (primary) hypertension: Secondary | ICD-10-CM | POA: Diagnosis not present

## 2015-12-23 DIAGNOSIS — E785 Hyperlipidemia, unspecified: Secondary | ICD-10-CM

## 2015-12-23 DIAGNOSIS — I110 Hypertensive heart disease with heart failure: Secondary | ICD-10-CM | POA: Diagnosis not present

## 2015-12-23 DIAGNOSIS — R61 Generalized hyperhidrosis: Secondary | ICD-10-CM | POA: Diagnosis not present

## 2015-12-23 DIAGNOSIS — G20 Parkinson's disease: Secondary | ICD-10-CM | POA: Diagnosis not present

## 2015-12-23 DIAGNOSIS — I5032 Chronic diastolic (congestive) heart failure: Secondary | ICD-10-CM | POA: Diagnosis not present

## 2015-12-23 DIAGNOSIS — R0789 Other chest pain: Secondary | ICD-10-CM

## 2015-12-23 DIAGNOSIS — R079 Chest pain, unspecified: Secondary | ICD-10-CM

## 2015-12-23 DIAGNOSIS — Z7984 Long term (current) use of oral hypoglycemic drugs: Secondary | ICD-10-CM | POA: Diagnosis not present

## 2015-12-23 DIAGNOSIS — J45909 Unspecified asthma, uncomplicated: Secondary | ICD-10-CM | POA: Diagnosis not present

## 2015-12-23 DIAGNOSIS — E0810 Diabetes mellitus due to underlying condition with ketoacidosis without coma: Secondary | ICD-10-CM

## 2015-12-23 DIAGNOSIS — R0602 Shortness of breath: Secondary | ICD-10-CM | POA: Diagnosis not present

## 2015-12-23 DIAGNOSIS — I509 Heart failure, unspecified: Secondary | ICD-10-CM | POA: Diagnosis not present

## 2015-12-23 DIAGNOSIS — Z87891 Personal history of nicotine dependence: Secondary | ICD-10-CM | POA: Diagnosis not present

## 2015-12-23 LAB — NM MYOCAR MULTI W/SPECT W/WALL MOTION / EF
LV dias vol: 100 mL (ref 46–106)
LV sys vol: 50 mL
Peak HR: 80 {beats}/min
RATE: 0.25
Rest HR: 51 {beats}/min
SDS: 1
SRS: 7
SSS: 8
TID: 1.16

## 2015-12-23 LAB — TROPONIN I: Troponin I: <0.03 ng/mL (ref <0.03)

## 2015-12-23 MED ORDER — SODIUM CHLORIDE 0.9% FLUSH
INTRAVENOUS | Status: AC
  Administered 2015-12-23: 10 mL via INTRAVENOUS
  Filled 2015-12-23: qty 10

## 2015-12-23 MED ORDER — TECHNETIUM TC 99M TETROFOSMIN IV KIT
10.0000 | PACK | Freq: Once | INTRAVENOUS | Status: AC | PRN
  Administered 2015-12-23: 10.9 via INTRAVENOUS

## 2015-12-23 MED ORDER — TECHNETIUM TC 99M TETROFOSMIN IV KIT
30.0000 | PACK | Freq: Once | INTRAVENOUS | Status: AC | PRN
  Administered 2015-12-23: 29.9 via INTRAVENOUS

## 2015-12-23 MED ORDER — REGADENOSON 0.4 MG/5ML IV SOLN
INTRAVENOUS | Status: AC
  Administered 2015-12-23: 0.4 mg via INTRAVENOUS
  Filled 2015-12-23: qty 5

## 2015-12-23 MED ORDER — ASPIRIN EC 81 MG PO TBEC: 81.0000 mg | DELAYED_RELEASE_TABLET | Freq: Every day | ORAL | Status: DC

## 2015-12-23 NOTE — Progress Notes (Signed)
Nutrition Follow-up  DOCUMENTATION CODES:  Obesity class II    INTERVENTION:  Heart healthy diet   Education emphasizing protein intake and small consistent meals provided   NUTRITION DIAGNOSIS:  Increased nutrient needs related to multiple chronic disease burden AEB est needs and pt hx.    GOAL:  Pt to meet >/= 90% of their estimated nutrition needs      MONITOR:  Po intake, labs and wt trends     REASON FOR ASSESSMENT:  Malnutrition Screen      ASSESSMENT:  Patient is 65 yo female with hx of HTN, Parkinson's dz, CHF, DM, COPD and depression. Patient says her appetite has been poor for the past 5 months since her husband died. According to her weight hx her usual weight range is 230-240# which is where her weight is currently. She is able to feed herself and has her daughter-in law here today. Patient also has grandchildren which she enjoys and [place].  Diet hx: Her diet has been advanced now to Heart healthy but she follows a regular diet at home.   Nutrition-Focused physical exam completed. Findings are no fat or muscle depletion, and no edema.     Recent Labs Lab 12/22/15 1417  NA 135  K 3.3*  CL 103  CO2 28  BUN 15  CREATININE 0.97  CALCIUM 8.5*  GLUCOSE 95   Labs and meds reviewed.  Diet Order:  Diet Heart Room service appropriate? Yes; Fluid consistency: Thin  Skin:   intact  Last BM:   8/29   Height:   Ht Readings from Last 1 Encounters:  12/22/15 5\' 6"  (1.676 m)    Weight:   Wt Readings from Last 1 Encounters:  12/22/15 234 lb 12.8 oz (106.5 kg)    Ideal Body Weight:   59 kg  BMI:  Body mass index is 37.9 kg/m. obesity class II  Estimated Nutritional Needs:   Kcal:   1500-1700  Protein:   85-95 gr   Fluid:   1.5-1.7 liters daily  EDUCATION NEEDS: encouraged small meals and consistent protein intake, sugar free beverages    Royann ShiversLynn Stefania Goulart MS,RD,CSG,LDN Office: [phone] Pager: [phone]

## 2015-12-23 NOTE — Consult Note (Signed)
   [facility] CM Inpatient Consult   12/23/2015  Damaris HippoGAIL R Rasnick 28-Feb-1951 [id number]  Spoke with patient and daughter in law at bedside regarding [facility] services. Patient is very familiar with [facility] as she has several family members who have participated in the past. Patient does not wish to participate with [facility] at this time. Patient given [facility] brochure and contact information for future reference.  Of note, [facility] Care Management services would not replace or interfere with any services that are arranged by inpatient case management or social work. For additional questions or referrals please contact:  Alben SpittleMary E. Albertha GheeNiemczura, RN, BSN, [facility]  [facility] Hospital Liaison [phone]

## 2015-12-23 NOTE — Care Management Obs Status (Signed)
MEDICARE OBSERVATION STATUS NOTIFICATION   Patient Details  Name: Allison HippoGAIL R Wessinger MRN: [id number] Date of Birth: [birth year]   Medicare Observation Status Notification Given:  Yes    Bridney Guadarrama, Chrystine OilerSharley Diane, RN 12/23/2015, 2:13 PM

## 2015-12-23 NOTE — Discharge Summary (Signed)
Physician Discharge Summary  Allison Rollins:[id number] DOB: 1950-08-10 DOA: 12/22/2015  PCP: Allison Sheriff, MD  Admit date: 12/22/2015 Discharge date: 12/23/2015  Admitted From: home Disposition:  home  Recommendations for Outpatient Follow-up:  1. Follow up with PCP in 1-2 weeks   Home Health: Equipment/Devices:  Discharge Condition: stable CODE STATUS: full Diet recommendation: Heart Healthy / Carb Modified   Brief/Interim Summary: 1. Chest pain. Her last stress test was 8-9 years ago. Her CXR revealed  mild stable cardiac enlargement. EKG showed sinus rhythm with low voltage precordial leads. She ruled out for ACS with negative cardiac markers. Seen by cardiology and underwent stress test that was felt to be a low risk study. She will be continued on aspirin. She is not having any further chest pain. She is stable for discharge.  2. Chronic diastolic CHF. Continue lasix PRN.  patient euvolemic during hospital stay 3. HTN. Continue outpatient regimen. Continue to monitor.   4. HLD. Continue statins.  5. GERD, Continue PPI 6. DM. Stable. Restart metformin on discharge 7. Obesity. Noted.   Discharge Diagnoses:  Principal Problem:   Chest pain Active Problems:   Benign hypertension   Hyperlipidemia   Chronic diastolic heart failure (HCC)   Diabetes [facility])    Discharge Instructions  Discharge Instructions    Diet - low sodium heart healthy    Complete by:  As directed   Increase activity slowly    Complete by:  As directed       Medication List    TAKE these medications   acetaminophen 500 MG tablet Commonly known as:  TYLENOL Take 500 mg by mouth as needed for moderate pain.   albuterol 108 (90 Base) MCG/ACT inhaler Commonly known as:  PROVENTIL HFA;VENTOLIN HFA Inhale 1-2 puffs into the lungs every 6 (six) hours as needed for wheezing.   aspirin EC 81 MG tablet Take 1 tablet (81 mg total) by mouth daily.   citalopram 20 MG tablet Commonly known as:   CELEXA Take 1 tablet (20 mg total) by mouth daily.   ergocalciferol 50000 units capsule Commonly known as:  VITAMIN D2 Take 50,000 Units by mouth once a week.   FISH OIL PO Take 6 capsules by mouth daily.   furosemide 40 MG tablet Commonly known as:  LASIX Take 40 mg by mouth 2 (two) times daily as needed for fluid.   gabapentin 300 MG capsule Commonly known as:  NEURONTIN TAKE ONE CAPSULE BY MOUTH TWICE DAILY   lisinopril 40 MG tablet Commonly known as:  PRINIVIL,ZESTRIL TAKE ONE TABLET BY MOUTH ONCE DAILY   metFORMIN 500 MG tablet Commonly known as:  GLUCOPHAGE TAKE ONE TABLET BY MOUTH ONCE DAILY WITH BREAKFAST   pantoprazole 40 MG tablet Commonly known as:  PROTONIX Take 1 tablet (40 mg total) by mouth daily.   pravastatin 40 MG tablet Commonly known as:  PRAVACHOL Take 1 tablet (40 mg total) by mouth at bedtime.       Allergies  Allergen Reactions  . Penicillins Itching and Rash    Has patient had a PCN reaction causing immediate rash, facial/tongue/throat swelling, SOB or lightheadedness with hypotension: no Has patient had a PCN reaction causing severe rash involving mucus membranes or skin necrosis: no Has patient had a PCN reaction that required hospitalization: no Has patient had a PCN reaction occurring within the last 10 years: no If all of the above answers are "NO", then may proceed with Cephalosporin use.   . Sulfa Antibiotics  Rash    Consultations:  cardiology   Procedures/Studies: Dg Chest 2 View  Result Date: 12/22/2015 CLINICAL DATA:  Chest pain, dizziness and shortness of breath since 10 a.m. this morning. EXAM: CHEST  2 VIEW COMPARISON:  Chest x-ray 05/08/2014 FINDINGS: The heart is mildly enlarged but stable. There is mild tortuosity of the thoracic aorta. The pulmonary hila appear normal. Mild hyperinflation. Streaky bibasilar atelectasis but no infiltrates or effusions. The bony thorax is intact. IMPRESSION: Mild stable cardiac  enlargement. Hyperinflation and streaky basilar atelectasis but no infiltrates or effusions. Electronically Signed   By: Rudie Meyer M.D.   On: 12/22/2015 15:24   Nm Myocar Multi W/spect W/wall Motion / Ef  Result Date: 12/23/2015  There was no ST segment deviation noted during stress.  No T wave inversion was noted during stress.  Findings consistent with prior myocardial infarction.  This is a low risk study.  The left ventricular ejection fraction is mildly decreased (45-54%).  Small inferolateral wall infarct at mid and basal level EF estimated 50% but looks normal no ischemia       Subjective: No chest pain, no shortness of breath  Discharge Exam: Vitals:   12/23/15 0649 12/23/15 1442  BP: 130/60 (!) 164/88  Pulse: (!) 50 (!) 50  Resp:  16  Temp: 98 F (36.7 C) 98.3 F (36.8 C)   Vitals:   12/22/15 2249 12/23/15 0249 12/23/15 0649 12/23/15 1442  BP: 139/69 136/67 130/60 (!) 164/88  Pulse: (!) 53 (!) 50 (!) 50 (!) 50  Resp:    16  Temp: 97.7 F (36.5 C) 98.4 F (36.9 C) 98 F (36.7 C) 98.3 F (36.8 C)  TempSrc: Oral Oral Oral Oral  SpO2: 92% 95% 95% 94%  Weight:      Height:        General: Pt is alert, awake, not in acute distress Cardiovascular: RRR, S1/S2 +, no rubs, no gallops Respiratory: CTA bilaterally, no wheezing, no rhonchi Abdominal: Soft, NT, ND, bowel sounds + Extremities: no edema, no cyanosis    The results of significant diagnostics from this hospitalization (including imaging, microbiology, ancillary and laboratory) are listed below for reference.     Microbiology: No results found for this or any previous visit (from the past 240 hour(s)).   Labs: BNP (last 3 results) No results for input(s): BNP in the last 8760 hours. Basic Metabolic Panel:  Recent Labs Lab 12/22/15 1417  NA 135  K 3.3*  CL 103  CO2 28  GLUCOSE 95  BUN 15  CREATININE 0.97  CALCIUM 8.5*   Liver Function Tests:  Recent Labs Lab 12/22/15 1417  AST  19  ALT 13*  ALKPHOS 70  BILITOT 0.6  PROT 7.2  ALBUMIN 3.9    Recent Labs Lab 12/22/15 1417  LIPASE 24   No results for input(s): AMMONIA in the last 168 hours. CBC:  Recent Labs Lab 12/22/15 1417  WBC 8.4  HGB 14.3  HCT 42.9  MCV 89.4  PLT 243   Cardiac Enzymes:  Recent Labs Lab 12/22/15 2007 12/23/15 0008  TROPONINI <0.03 <0.03   BNP: Invalid input(s): POCBNP CBG:  Recent Labs Lab 12/22/15 2017  GLUCAP 102*   D-Dimer No results for input(s): DDIMER in the last 72 hours. Hgb A1c No results for input(s): HGBA1C in the last 72 hours. Lipid Profile No results for input(s): CHOL, HDL, LDLCALC, TRIG, CHOLHDL, LDLDIRECT in the last 72 hours. Thyroid function studies No results for input(s): TSH, T4TOTAL, T3FREE,  THYROIDAB in the last 72 hours.  Invalid input(s): FREET3 Anemia work up No results for input(s): VITAMINB12, FOLATE, FERRITIN, TIBC, IRON, RETICCTPCT in the last 72 hours. Urinalysis    Component Value Date/Time   COLORURINE YELLOW 05/08/2014 1040   APPEARANCEUR CLEAR 05/08/2014 1040   LABSPEC >1.030 (H) 05/08/2014 1040   PHURINE 6.0 05/08/2014 1040   GLUCOSEU NEGATIVE 05/08/2014 1040   HGBUR SMALL (A) 05/08/2014 1040   BILIRUBINUR MODERATE (A) 05/08/2014 1040   KETONESUR 40 (A) 05/08/2014 1040   PROTEINUR 100 (A) 05/08/2014 1040   UROBILINOGEN 1.0 05/08/2014 1040   NITRITE NEGATIVE 05/08/2014 1040   LEUKOCYTESUR NEGATIVE 05/08/2014 1040   Sepsis Labs Invalid input(s): PROCALCITONIN,  WBC,  LACTICIDVEN Microbiology No results found for this or any previous visit (from the past 240 hour(s)).   Time coordinating discharge: Over 30 minutes  SIGNED:   Erick BlinksMEMON,Vidur Knust, MD  [facility] 12/23/2015, 4:34 PM Pager   If 7PM-7AM, please contact night-coverage [email or web address] Password [email or web address]

## 2015-12-23 NOTE — Discharge Instructions (Signed)

## 2015-12-23 NOTE — Progress Notes (Signed)
Discharged PT per MD order and protocol. Discharge handouts reviewed/explained. Education completed.  Pt verbalized understanding and left with all belongings. VSS. IV catheter D/C.  Patient wheeled down by staff member.  

## 2015-12-23 NOTE — Care Management Note (Signed)
Case Management Note  Patient Details  Name: Allison Rollins MRN: [id number] Date of Birth: 21-Feb-1951  Subjective/Objective: Patient adm from home with chest pain. She is independent with ADL's. She has a PCP, Dr Rex Krasarol Vincent. She has insurance, reports no issues.                   Action/Plan: Anticipate DC home with self care. No CM needs.    Expected Discharge Date:                  Expected Discharge Plan:  Home/Self Care  In-House Referral:  NA  Discharge planning Services  CM Consult  Post Acute Care Choice:  NA Choice offered to:  NA  DME Arranged:    DME Agency:     HH Arranged:    HH Agency:     Status of Service:  Completed, signed off  If discussed at [employer] Length of Stay Meetings, dates discussed:    Additional Comments:  Dail Lerew, Chrystine OilerSharley Diane, RN 12/23/2015, 1:55 PM

## 2015-12-23 NOTE — Progress Notes (Signed)
    Subjective:  No chest pain  Objective:  Vital Signs in the last 24 hours: Temp:  [97.4 F (36.3 C)-98.4 F (36.9 C)] 98 F (36.7 C) (08/29 0649) Pulse Rate:  [50-63] 50 (08/29 0649) Resp:  [15-24] 22 (08/28 1849) BP: (117-163)/(51-92) 130/60 (08/29 0649) SpO2:  [92 %-98 %] 95 % (08/29 0649) Weight:  [234 lb 12.8 oz (106.5 kg)-247 lb (112 kg)] 234 lb 12.8 oz (106.5 kg) (08/28 1849)  Intake/Output from previous day:  Intake/Output Summary (Last 24 hours) at 12/23/15 0912 Last data filed at 12/23/15 0815  Gross per 24 hour  Intake              240 ml  Output              300 ml  Net              -60 ml    Physical Exam: General appearance: alert, cooperative, no distress and mildly obese Lungs: clear to auscultation bilaterally Heart: regular rate and rhythm Extremities: extremities normal, atraumatic, no cyanosis or edema Skin: Skin color, texture, turgor normal. No rashes or lesions Neurologic: Grossly normal   Rate: 72  Rhythm: normal sinus rhythm  Lab Results:  Recent Labs  12/22/15 1417  WBC 8.4  HGB 14.3  PLT 243    Recent Labs  12/22/15 1417  NA 135  K 3.3*  CL 103  CO2 28  GLUCOSE 95  BUN 15  CREATININE 0.97    Recent Labs  12/22/15 2007 12/23/15 0008  TROPONINI <0.03 <0.03   No results for input(s): INR in the last 72 hours.  Scheduled Meds: . citalopram  20 mg Oral Daily  . enoxaparin (LOVENOX) injection  40 mg Subcutaneous Q24H  . gabapentin  300 mg Oral BID  . lisinopril  40 mg Oral Daily  . metFORMIN  500 mg Oral Q breakfast  . omega-3 acid ethyl esters  2 g Oral Daily  . pantoprazole  40 mg Oral Daily  . pravastatin  40 mg Oral QHS  . regadenoson      . sodium chloride flush       Continuous Infusions:  PRN Meds:.acetaminophen, ALPRAZolam, gi cocktail, morphine injection, ondansetron (ZOFRAN) IV, technetium tetrofosmin   Imaging: Dg Chest 2 View  Result Date: 12/22/2015 CLINICAL DATA:  Chest pain, dizziness and  shortness of breath since 10 a.m. this morning. EXAM: CHEST  2 VIEW COMPARISON:  Chest x-ray 05/08/2014 FINDINGS: The heart is mildly enlarged but stable. There is mild tortuosity of the thoracic aorta. The pulmonary hila appear normal. Mild hyperinflation. Streaky bibasilar atelectasis but no infiltrates or effusions. The bony thorax is intact. IMPRESSION: Mild stable cardiac enlargement. Hyperinflation and streaky basilar atelectasis but no infiltrates or effusions. Electronically Signed   By: Rudie MeyerP.  Gallerani M.D.   On: 12/22/2015 15:24    Cardiac Studies:  Assessment/Plan:   Principal Problem:   Chest pain Active Problems:   Benign hypertension   Hyperlipidemia   Chronic diastolic heart failure (HCC)   Diabetes (HCC)   PLAN: Myoview tolerated well- final report pending.  Corine ShelterLuke Kilroy PA-C 12/23/2015, 9:12 AM [phone]  Patient examined chart reviewed see note Dr Tenny Crawoss yesterday R/O no pain this am for myovue today.   Exam benign with no murmur and clear lung fields  Charlton HawsPeter Chazz Philson

## 2015-12-25 ENCOUNTER — Other Ambulatory Visit: Payer: Self-pay | Admitting: Pediatrics

## 2015-12-25 ENCOUNTER — Ambulatory Visit (INDEPENDENT_AMBULATORY_CARE_PROVIDER_SITE_OTHER): Payer: Commercial Managed Care - HMO | Admitting: Pediatrics

## 2015-12-25 ENCOUNTER — Encounter: Payer: Self-pay | Admitting: Pediatrics

## 2015-12-25 VITALS — BP 133/82 | HR 50 | Temp 97.4°F | Ht 66.0 in | Wt 230.0 lb

## 2015-12-25 DIAGNOSIS — E669 Obesity, unspecified: Secondary | ICD-10-CM | POA: Diagnosis not present

## 2015-12-25 DIAGNOSIS — I10 Essential (primary) hypertension: Secondary | ICD-10-CM

## 2015-12-25 DIAGNOSIS — E0810 Diabetes mellitus due to underlying condition with ketoacidosis without coma: Secondary | ICD-10-CM | POA: Diagnosis not present

## 2015-12-25 DIAGNOSIS — I5032 Chronic diastolic (congestive) heart failure: Secondary | ICD-10-CM | POA: Diagnosis not present

## 2015-12-25 DIAGNOSIS — F329 Major depressive disorder, single episode, unspecified: Secondary | ICD-10-CM | POA: Diagnosis not present

## 2015-12-25 DIAGNOSIS — F32A Depression, unspecified: Secondary | ICD-10-CM

## 2015-12-25 DIAGNOSIS — R0789 Other chest pain: Secondary | ICD-10-CM | POA: Diagnosis not present

## 2015-12-25 MED ORDER — NITROGLYCERIN 0.4 MG SL SUBL: 0.4000 mg | SUBLINGUAL_TABLET | SUBLINGUAL | 3 refills | Status: AC | PRN

## 2015-12-25 NOTE — Progress Notes (Signed)
  Subjective:   Patient ID: Allison Rollins, female    DOB: [birth year], 65 y.o.   MRN: [id number] CC: Follow-up multiple med problems, recent hospitalization HPI: Allison Rollins is a 65 y.o. female presenting for Follow-up  Recently hospitalized for chest pain Had low risk cardiac study Says she is feeling much better now Episode of sweating and feeling bad at work prompted EMS getting called and the admission tropnonins neg  No further episodes of chest pain Not yet back to work depression is better on the citalopram she says  No headaches, no fevers No SOB Walking some  [facility] 12/25/2015 11/13/2015 10/07/2015 05/15/2015 01/06/2015  Decreased Interest 2 2 3  0 0  Down, Depressed, Hopeless 3 3 1  0 0  PHQ - 2 Score 5 5 4  0 0  Altered sleeping 2 2 1  - -  Tired, decreased energy 3 3 3  - -  Change in appetite 3 3 3  - -  Feeling bad or failure about yourself  3 3 3  - -  Trouble concentrating 2 2 1  - -  Moving slowly or fidgety/restless 1 1 1  - -  Suicidal thoughts 0 1 0 - -  PHQ-9 Score 19 20 16  - -  Difficult doing work/chores Somewhat difficult Very difficult - - -     Relevant past medical, surgical, family and social history reviewed. Allergies and medications reviewed and updated. History  Smoking Status  . Former Smoker  . Packs/day: 0.25  . Years: 30.00  . Types: Cigarettes  . Start date: 01/04/1965  . Quit date: 01/03/1995  Smokeless Tobacco  . Never Used   ROS: Per HPI   Objective:    BP 133/82   Pulse (!) 50   Temp 97.4 F (36.3 C) (Oral)   Ht 5\' 6"  (1.676 m)   Wt 230 lb (104.3 kg)   BMI 37.12 kg/m   Wt Readings from Last 3 Encounters:  12/25/15 230 lb (104.3 kg)  12/22/15 234 lb 12.8 oz (106.5 kg)  11/13/15 236 lb 6.4 oz (107.2 kg)    Gen: NAD, alert, cooperative with exam, NCAT EYES: EOMI, no conjunctival injection, or no icterus ENT:  TMs pearly gray b/l, OP without erythema LYMPH: no cervical LAD CV: NRRR, normal S1/S2 Resp: CTABL,  no wheezes, normal WOB Abd: +BS, soft, NTND. no guarding or organomegaly Ext: No edema, warm Neuro: Alert and oriented, strength equal b/l UE and LE, coordination grossly normal MSK: normal muscle bulk Psych: affect full, no thoughts of self harm  Assessment & Plan:  Allison Rollins was seen today for follow-up.  Diagnoses and all orders for this visit:  Benign hypertension Adequate control, cont current meds  Chronic diastolic heart failure (HCC) euvolemic today BP adequate control  Obesity Cont to work on lifestyle changes, increase physical activity with gentle walking  Other chest pain No further episodes of chest pain since discharge Nitroglycerin did help when she had pain in clinic prior to admission last week Refill sent in  Diabetes Cont metformin  Depression Cont citalopram 20mg , discussed increasing dose today, pt wants to wait She does feel better Continue to encourage counseling   Follow up plan: Return in about 3 months (around 03/25/2016). Rex Krasarol Cephas Revard, MD [facility]

## 2015-12-26 DIAGNOSIS — F339 Major depressive disorder, recurrent, unspecified: Secondary | ICD-10-CM | POA: Insufficient documentation

## 2016-01-13 ENCOUNTER — Other Ambulatory Visit: Payer: Self-pay | Admitting: Pediatrics

## 2016-01-22 NOTE — Progress Notes (Signed)
This encounter was created in error - please disregard.

## 2016-02-09 DIAGNOSIS — Z23 Encounter for immunization: Secondary | ICD-10-CM | POA: Diagnosis not present

## 2016-02-10 DIAGNOSIS — Z23 Encounter for immunization: Secondary | ICD-10-CM | POA: Diagnosis not present

## 2016-02-24 ENCOUNTER — Other Ambulatory Visit: Payer: Self-pay | Admitting: Pediatrics

## 2016-03-17 ENCOUNTER — Encounter: Payer: Self-pay | Admitting: Cardiology

## 2016-03-17 ENCOUNTER — Ambulatory Visit (INDEPENDENT_AMBULATORY_CARE_PROVIDER_SITE_OTHER): Payer: Commercial Managed Care - HMO | Admitting: Cardiology

## 2016-03-17 ENCOUNTER — Encounter: Payer: Self-pay | Admitting: *Deleted

## 2016-03-17 VITALS — BP 149/75 | HR 51 | Ht 66.0 in | Wt 236.8 lb

## 2016-03-17 DIAGNOSIS — I5032 Chronic diastolic (congestive) heart failure: Secondary | ICD-10-CM | POA: Diagnosis not present

## 2016-03-17 DIAGNOSIS — R001 Bradycardia, unspecified: Secondary | ICD-10-CM

## 2016-03-17 DIAGNOSIS — I10 Essential (primary) hypertension: Secondary | ICD-10-CM

## 2016-03-17 DIAGNOSIS — E782 Mixed hyperlipidemia: Secondary | ICD-10-CM

## 2016-03-17 DIAGNOSIS — R0789 Other chest pain: Secondary | ICD-10-CM

## 2016-03-17 NOTE — Progress Notes (Signed)
Clinical Summary Ms. Rone is a 65 y.o.female seen today for follow up of the following medical problems   1. Chronic diastolic heart failure - occasional LE edema. Takes lasix as needed.    2. HTN - compliant with meds - does not check her bp at home.  3. COPD - compliant with inhalers  4. Bradycardia - chronic sinus bradycardia - denies any lightheadedness or dizziness since last visit  5. Hyperlipidemia - she is on pravastatin 40mg  daily - reports recent labs with pcp  6. OSA screen - has had not sleep study yet, she is not interested in scheduling at this time.    7. Chest pain  - isolated episode about 1 month ag. Pressure like feeling, better with deep breaths. 3/10 in severity. Tends to occur at rest. No other associated symptoms. No exertional symptoms. Lasts just a few minutes.   - admit 11/2015 with chest pain. Nuclear stress test without significant ischemia.  - reports episode 2 weeks ago. She reports she was looking at pictures at home of her passed. Started feeling numbness and tingling in chest, blurry vision, mild chest pain. Diagnosed with panic attack. -   8. Prediabetes - followed by pcp  SH: has 3 grandkids, 2 sons. Husband just passed this March. Works at [employer].  Past Medical History:  Diagnosis Date  . Anxiety   . Asthma   . CHF (congestive heart failure) (HCC)   . COPD (chronic obstructive pulmonary disease) (HCC)   . Depression   . Diabetes mellitus without complication (HCC)   . HTN (hypertension)   . Hypoglycemia   . Kidney stones   . Parkinson's disease   . Pneumonia   . S/P colonoscopy 10/15/04   normal     Allergies  Allergen Reactions  . Penicillins Itching and Rash    Has patient had a PCN reaction causing immediate rash, facial/tongue/throat swelling, SOB or lightheadedness with hypotension: no Has patient had a PCN reaction causing severe rash involving mucus membranes or skin necrosis: no Has  patient had a PCN reaction that required hospitalization: no Has patient had a PCN reaction occurring within the last 10 years: no If all of the above answers are "NO", then may proceed with Cephalosporin use.   . Sulfa Antibiotics Rash     Current Outpatient Prescriptions  Medication Sig Dispense Refill  . acetaminophen (TYLENOL) 500 MG tablet Take 500 mg by mouth as needed for moderate pain.     Marland Kitchen. albuterol (PROVENTIL HFA;VENTOLIN HFA) 108 (90 BASE) MCG/ACT inhaler Inhale 1-2 puffs into the lungs every 6 (six) hours as needed for wheezing. 1 Inhaler 0  . aspirin EC 81 MG tablet Take 1 tablet (81 mg total) by mouth daily.    . citalopram (CELEXA) 20 MG tablet TAKE ONE TABLET BY MOUTH ONCE DAILY 30 tablet 2  . ergocalciferol (VITAMIN D2) 50000 UNITS capsule Take 50,000 Units by mouth once a week.    . furosemide (LASIX) 40 MG tablet Take 40 mg by mouth 2 (two) times daily as needed for fluid.     Marland Kitchen. gabapentin (NEURONTIN) 300 MG capsule TAKE ONE CAPSULE BY MOUTH TWICE DAILY 60 capsule 0  . gabapentin (NEURONTIN) 300 MG capsule TAKE ONE CAPSULE BY MOUTH TWICE DAILY 60 capsule 0  . lisinopril (PRINIVIL,ZESTRIL) 40 MG tablet TAKE ONE TABLET BY MOUTH ONCE DAILY 90 tablet 1  . metFORMIN (GLUCOPHAGE) 500 MG tablet TAKE ONE TABLET BY MOUTH ONCE DAILY WITH BREAKFAST 30  tablet 5  . nitroGLYCERIN (NITROSTAT) 0.4 MG SL tablet Place 1 tablet (0.4 mg total) under the tongue every 5 (five) minutes as needed for chest pain. 20 tablet 3  . Omega-3 Fatty Acids (FISH OIL PO) Take 6 capsules by mouth daily.     . pantoprazole (PROTONIX) 40 MG tablet Take 1 tablet (40 mg total) by mouth daily. 30 tablet 3  . pravastatin (PRAVACHOL) 40 MG tablet Take 1 tablet (40 mg total) by mouth at bedtime. 30 tablet 11   No current facility-administered medications for this visit.      Past Surgical History:  Procedure Laterality Date  . CHOLECYSTECTOMY    . COLONOSCOPY  10/15/2004   Normal rectum/Normal colon  .  ESOPHAGOGASTRODUODENOSCOPY  09/11/2010   Dysphagia likely multifactorial (possible Candida esophagitis, likely nonspecific esophageal motility disorder, and/or uncontrolled gastroesophageal reflux disease), status post empiric dilation  . KIDNEY STONE SURGERY       Allergies  Allergen Reactions  . Penicillins Itching and Rash    Has patient had a PCN reaction causing immediate rash, facial/tongue/throat swelling, SOB or lightheadedness with hypotension: no Has patient had a PCN reaction causing severe rash involving mucus membranes or skin necrosis: no Has patient had a PCN reaction that required hospitalization: no Has patient had a PCN reaction occurring within the last 10 years: no If all of the above answers are "NO", then may proceed with Cephalosporin use.   . Sulfa Antibiotics Rash      Family History  Problem Relation Age of Onset  . Coronary artery disease Father   . COPD Mother   . Coronary artery disease Brother   . Coronary artery disease Sister      Social History Ms. Beaudoin reports that she quit smoking about 21 years ago. Her smoking use included Cigarettes. She started smoking about 51 years ago. She has a 7.50 pack-year smoking history. She has never used smokeless tobacco. Ms. Blake DivineSessor reports that she does not drink alcohol.   Review of Systems CONSTITUTIONAL: No weight loss, fever, chills, weakness or fatigue.  HEENT: Eyes: No visual loss, blurred vision, double vision or yellow sclerae.No hearing loss, sneezing, congestion, runny nose or sore throat.  SKIN: No rash or itching.  CARDIOVASCULAR: per hpi RESPIRATORY: No shortness of breath, cough or sputum.  GASTROINTESTINAL: No anorexia, nausea, vomiting or diarrhea. No abdominal pain or blood.  GENITOURINARY: No burning on urination, no polyuria NEUROLOGICAL: No headache, dizziness, syncope, paralysis, ataxia, numbness or tingling in the extremities. No change in bowel or bladder control.  MUSCULOSKELETAL:  No muscle, back pain, joint pain or stiffness.  LYMPHATICS: No enlarged nodes. No history of splenectomy.  PSYCHIATRIC: No history of depression or anxiety.  ENDOCRINOLOGIC: No reports of sweating, cold or heat intolerance. No polyuria or polydipsia.  Marland Kitchen.   Physical Examination Vitals:   03/17/16 1337  BP: (!) 149/75  Pulse: (!) 51   Vitals:   03/17/16 1337  Weight: 236 lb 12.8 oz (107.4 kg)  Height: 5\' 6"  (1.676 m)    Gen: resting comfortably, no acute distress HEENT: no scleral icterus, pupils equal round and reactive, no palptable cervical adenopathy,  CV: RRR, no m/r/g, no jvd Resp: Clear to auscultation bilaterally GI: abdomen is soft, non-tender, non-distended, normal bowel sounds, no hepatosplenomegaly MSK: extremities are warm, no edema.  Skin: warm, no rash Neuro:  no focal deficits Psych: appropriate affect   Diagnostic Studies 11/2013 Echo Study Conclusions  - Procedure narrative: Transthoracic echocardiography. Image quality was suboptimal.  The study was technically difficult, as a result of poor sound wave transmission and body habitus. - Left ventricle: The cavity size was normal. Wall thickness was increased in a pattern of mild LVH. Systolic function was low normal. The estimated ejection fraction was approximately 50%. Images were inadequate for LV wall motion assessment, but no gross regional variation was noted. Features are consistent with a pseudonormal left ventricular filling pattern, with concomitant abnormal relaxation and increased filling pressure (grade 2 diastolic dysfunction). Doppler parameters are consistent with both elevated ventricular end-diastolic filling pressure and elevated left atrial filling pressure. - Mitral valve: Mildly calcified annulus. Mildly thickened leaflets . There was mild regurgitation. - Left atrium: The atrium was moderately to severely dilated.     Assessment and Plan   1. Chronic diastolic heart  failure - overall controlled with prn lasix - continue current meds  2. HTN - she is at goal, manual bp 140/70  3. Bradycardia - chronic sinus bradycardia that is asymptomatic - continue to monitor   4. Hyperlipidemia - continue current statin - request labs from pcp  5. Chest pain - long history of chest pain. Most recently admit 11/2015 with chest pain, negative nuclear stress test - no recent symptoms - continue to monitor   F/u 6 months     Antoine Poche, M.D.

## 2016-03-17 NOTE — Patient Instructions (Signed)

## 2016-03-23 ENCOUNTER — Other Ambulatory Visit: Payer: Self-pay | Admitting: Pediatrics

## 2016-03-29 ENCOUNTER — Ambulatory Visit (INDEPENDENT_AMBULATORY_CARE_PROVIDER_SITE_OTHER): Payer: Commercial Managed Care - HMO | Admitting: Pediatrics

## 2016-03-29 ENCOUNTER — Encounter: Payer: Self-pay | Admitting: Pediatrics

## 2016-03-29 VITALS — BP 144/87 | HR 50 | Temp 97.8°F | Ht 66.0 in | Wt 236.0 lb

## 2016-03-29 DIAGNOSIS — I10 Essential (primary) hypertension: Secondary | ICD-10-CM

## 2016-03-29 DIAGNOSIS — Z1211 Encounter for screening for malignant neoplasm of colon: Secondary | ICD-10-CM

## 2016-03-29 DIAGNOSIS — E785 Hyperlipidemia, unspecified: Secondary | ICD-10-CM

## 2016-03-29 DIAGNOSIS — F32A Depression, unspecified: Secondary | ICD-10-CM

## 2016-03-29 DIAGNOSIS — I5032 Chronic diastolic (congestive) heart failure: Secondary | ICD-10-CM

## 2016-03-29 DIAGNOSIS — E1142 Type 2 diabetes mellitus with diabetic polyneuropathy: Secondary | ICD-10-CM

## 2016-03-29 DIAGNOSIS — G629 Polyneuropathy, unspecified: Secondary | ICD-10-CM | POA: Diagnosis not present

## 2016-03-29 DIAGNOSIS — F329 Major depressive disorder, single episode, unspecified: Secondary | ICD-10-CM

## 2016-03-29 LAB — BAYER DCA HB A1C WAIVED: HB A1C (BAYER DCA - WAIVED): 5.7 % (ref <7.0)

## 2016-03-29 MED ORDER — GABAPENTIN 300 MG PO CAPS: ORAL_CAPSULE | ORAL | 1 refills | Status: DC

## 2016-03-29 MED ORDER — PRAVASTATIN SODIUM 40 MG PO TABS: 40.0000 mg | ORAL_TABLET | Freq: Every day | ORAL | 3 refills | Status: DC

## 2016-03-29 MED ORDER — CITALOPRAM HYDROBROMIDE 20 MG PO TABS: 40.0000 mg | ORAL_TABLET | Freq: Every day | ORAL | 0 refills | Status: DC

## 2016-03-29 NOTE — Progress Notes (Signed)
  Subjective:   Patient ID: Allison Rollins, female    DOB: Sep 23, 1950, 65 y.o.   MRN: [id number] CC: Follow-up (3 month) multiple med problems HPI: Allison Rollins is a 65 y.o. female presenting for Follow-up (3 month)  Edema: takes lasix when she thinks she needs it, last took it 2 weeks ago No chest pain or pressure in last few weeks Got SOB raking yard a few weeks ago, slowed down but kept raking and did fine No chest pain  Has noticed having some bloating in abd last few weeks Continues to have swelling in LE Worse at the end of the day after she has been on her feet Worse on days she works and is on her feet a lot  Relevant past medical, surgical, family and social history reviewed. Allergies and medications reviewed and updated. History  Smoking Status  . Former Smoker  . Packs/day: 0.25  . Years: 30.00  . Types: Cigarettes  . Start date: 01/04/1965  . Quit date: 01/03/1995  Smokeless Tobacco  . Never Used   ROS: Per HPI   Objective:    BP (!) 144/87   Pulse (!) 50   Temp 97.8 F (36.6 C) (Oral)   Ht _0  (1.676 m)   Wt 236 lb (107 kg)   BMI 38.09 kg/m   Wt Readings from Last 3 Encounters:  03/29/16 236 lb (107 kg)  03/17/16 236 lb 12.8 oz (107.4 kg)  12/25/15 230 lb (104.3 kg)    Gen: NAD, alert, cooperative with exam, NCAT EYES: EOMI, no conjunctival injection, or no icterus CV: NRRR, normal S1/S2 Resp: CTABL, no wheezes, normal WOB Abd: +BS, soft, NTND. no guarding or organomegaly Ext: 1+ pitting edema b/l lower ext Neuro: Alert and oriented, sensation intact b/l feet to light touch and monofilament MSK: normal muscle bulk Skin: feet normal exam b/l  Assessment & Plan:  Allison Rollins was seen today for follow-up.  Diagnoses and all orders for this visit:  Type 2 diabetes mellitus with diabetic polyneuropathy, without long-term current use of insulin ([place]) -     [facility] [place] Hb A1c Waived  Screen for colon cancer Due for colonoscopy, last in 2006 -      Ambulatory referral to Gastroenterology  [facility]) Ongoing symptoms at night, will increase to 614m qhs, cont 3026min morning -     gabapentin (NEURONTIN) 300 MG capsule; Take 30025mn the morning, 600m48m night  Hyperlipidemia, unspecified hyperlipidemia type Cont below, no s/e -     pravastatin (PRAVACHOL) 40 MG tablet; Take 1 tablet (40 mg total) by mouth at bedtime.  Depression, unspecified depression type Ongoing symptoms, ongoing since husband's death Thinks celexa has been helpful, but thinks she could do better Will increase to 40mg68m   citalopram (CELEXA) 20 MG tablet; Take 2 tablets (40 mg total) by mouth daily.  Benign hypertension Slightly elevated today Check at home, let m eknow if remains elevated -     BMP8+ZTI4+PYKDonic diastolic heart failure (HCC) No SOB, does have swelling in LE Take lasix daily  Follow up plan: Return in about 3 months (around 06/27/2016). [place]

## 2016-03-29 NOTE — Patient Instructions (Signed)
Take two celexa in the morning for mood Take two gabapentin at night for neuropathy

## 2016-03-30 LAB — BMP8+EGFR
BUN/Creatinine Ratio: 17 (ref 12–28)
BUN: 13 mg/dL (ref 8–27)
CO2: 29 mmol/L (ref 18–29)
Calcium: 9.5 mg/dL (ref 8.7–10.3)
Chloride: 103 mmol/L (ref 96–106)
Creatinine, Ser: 0.75 mg/dL (ref 0.57–1.00)
GFR calc Af Amer: 96 mL/min/{1.73_m2} (ref >59)
GFR calc non Af Amer: 83 mL/min/{1.73_m2} (ref >59)
Glucose: 95 mg/dL (ref 65–99)
Potassium: 4.3 mmol/L (ref 3.5–5.2)
Sodium: 145 mmol/L — ABNORMAL HIGH (ref 134–144)

## 2016-03-31 ENCOUNTER — Telehealth: Payer: Self-pay | Admitting: Pediatrics

## 2016-03-31 NOTE — Telephone Encounter (Signed)
Patient aware that this office does not carry extra BP machines

## 2016-04-07 ENCOUNTER — Telehealth: Payer: Self-pay

## 2016-04-07 NOTE — Telephone Encounter (Signed)
[phone]  Patient received letter to schedule tcs

## 2016-04-08 NOTE — Telephone Encounter (Signed)
LMOM to call.

## 2016-04-15 ENCOUNTER — Other Ambulatory Visit: Payer: Self-pay | Admitting: Pediatrics

## 2016-04-22 ENCOUNTER — Telehealth: Payer: Self-pay

## 2016-04-22 NOTE — Telephone Encounter (Signed)
Pt's last colonoscopy was 10/15/2004 by Dr. Jena Gaussourk. ( Pt had EGD in 08/2010 by Dr. Darrick PennaFields and it was noted Dr. Darrick PennaFields is GI. I have triaged pt today and she is checking on transportation.  See separate triage.

## 2016-04-27 NOTE — Telephone Encounter (Signed)
MOVI PREP SPLIT DOSING, FULL LIQUIDS WITH BREAKFAST.  CONTINUE GLUCOPHAGE.   Full Liquid Diet A high-calorie, high-protein supplement should be used to meet your nutritional requirements when the full liquid diet is continued for more than 2 or 3 days. If this diet is to be used for an extended period of time (more than 7 days), a multivitamin should be considered.  Breads and Starches  Allowed: None are allowed   Avoid: Any others.    Potatoes/Pasta/Rice  Allowed: ANY ITEM AS A SOUP OR SMALL PLATE OF MASHED POTATOES OR SCRAMBLED EGGS. (DO NOT EAT MORE THAN ONE SERVING ON THE DAY BEFORE COLONOSCOPY).      Vegetables  Allowed: Strained tomato or vegetable juice. Vegetables pureed in soup.   Avoid: Any others.    Fruit  Allowed: Any strained fruit juices and fruit drinks. Include 1 serving of citrus or vitamin C-enriched fruit juice daily.   Avoid: Any others.  Meat and Meat Substitutes  Allowed: Egg  Avoid: Any meat, fish, or fowl. All cheese.  Milk  Allowed: SOY Milk beverages, including milk shakes and instant breakfast mixes. Smooth yogurt.   Avoid: Any others. Avoid dairy products if not tolerated.    Soups and Combination Foods  Allowed: Broth, strained cream soups. Strained, broth-based soups.   Avoid: Any others.    Desserts and Sweets  Allowed: flavored gelatin, tapioca, ice cream, sherbet, smooth pudding, junket, fruit ices, frozen ice pops, pudding pops, frozen fudge pops, chocolate syrup. Sugar, honey, jelly, syrup.   Avoid: Any others.  Fats and Oils  Allowed: Margarine, butter, cream, sour cream, oils.   Avoid: Any others.  Beverages  Allowed: All.   Avoid: None.  Condiments  Allowed: Iodized salt, pepper, spices, flavorings. Cocoa powder.   Avoid: Any others.    SAMPLE MEAL PLAN Breakfast   cup orange juice.   1 OR 2 EGGS  1 cup milk.   1 cup beverage (coffee or tea).   Cream or sugar, if desired.    Midmorning  Snack  2 SCRAMBLED OR HARD BOILED EGG   Lunch  1 cup cream soup.    cup fruit juice.   1 cup milk.    cup custard.   1 cup beverage (coffee or tea).   Cream or sugar, if desired.    Midafternoon Snack  1 cup milk shake.  Dinner  1 cup cream soup.    cup fruit juice.   1 cup MILK    cup pudding.   1 cup beverage (coffee or tea).   Cream or sugar, if desired.  Evening Snack  1 cup supplement.  To increase calories, add sugar, cream, butter, or margarine if possible. Nutritional supplements will also increase the total calories.

## 2016-04-27 NOTE — Telephone Encounter (Signed)
Gastroenterology Pre-Procedure Review  Request Date: 04/22/2016 Requesting Physician: Rex Krasarol Vincent  PATIENT REVIEW QUESTIONS: The patient responded to the following health history questions as indicated:    1. Diabetes Melitis: YES 2. Joint replacements in the past 12 months: no 3. Major health problems in the past 3 months: no 4. Has an artificial valve or MVP: no 5. Has a defibrillator: no 6. Has been advised in past to take antibiotics in advance of a procedure like teeth cleaning: no 7. Family history of colon cancer: no  8. Alcohol Use: no 9. History of sleep apnea: no  10. History of coronary artery or other vascular stents placed within the last 12 months: no    MEDICATIONS & ALLERGIES:    Patient reports the following regarding taking any blood thinners:   Plavix? no Aspirin? no Coumadin? no Brilinta? no Xarelto? no Eliquis? no Pradaxa? no Savaysa? no Effient? no  Patient confirms/reports the following medications:  Current Outpatient Prescriptions  Medication Sig Dispense Refill  . acetaminophen (TYLENOL) 500 MG tablet Take 500 mg by mouth as needed for moderate pain.     Marland Kitchen. albuterol (PROVENTIL HFA;VENTOLIN HFA) 108 (90 BASE) MCG/ACT inhaler Inhale 1-2 puffs into the lungs every 6 (six) hours as needed for wheezing. 1 Inhaler 0  . aspirin EC 81 MG tablet Take 1 tablet (81 mg total) by mouth daily.    . citalopram (CELEXA) 20 MG tablet Take 2 tablets (40 mg total) by mouth daily. 180 tablet 0  . ergocalciferol (VITAMIN D2) 50000 UNITS capsule Take 50,000 Units by mouth once a week.    . furosemide (LASIX) 40 MG tablet Take 40 mg by mouth 2 (two) times daily as needed for fluid.     Marland Kitchen. lisinopril (PRINIVIL,ZESTRIL) 40 MG tablet TAKE ONE TABLET BY MOUTH ONCE DAILY 90 tablet 1  . metFORMIN (GLUCOPHAGE) 500 MG tablet TAKE ONE TABLET BY MOUTH ONCE DAILY WITH BREAKFAST 30 tablet 5  . pantoprazole (PROTONIX) 40 MG tablet TAKE ONE TABLET BY MOUTH ONCE DAILY 30 tablet 5  .  pravastatin (PRAVACHOL) 40 MG tablet Take 1 tablet (40 mg total) by mouth at bedtime. 90 tablet 3  . gabapentin (NEURONTIN) 300 MG capsule Take 300mg  in the morning, 600mg  at night 270 capsule 1  . nitroGLYCERIN (NITROSTAT) 0.4 MG SL tablet Place 1 tablet (0.4 mg total) under the tongue every 5 (five) minutes as needed for chest pain. (Patient not taking: Reported on 04/22/2016) 20 tablet 3  . Omega-3 Fatty Acids (FISH OIL PO) Take 6 capsules by mouth daily.      No current facility-administered medications for this visit.     Patient confirms/reports the following allergies:  Allergies  Allergen Reactions  . Penicillins Itching and Rash    Has patient had a PCN reaction causing immediate rash, facial/tongue/throat swelling, SOB or lightheadedness with hypotension: no Has patient had a PCN reaction causing severe rash involving mucus membranes or skin necrosis: no Has patient had a PCN reaction that required hospitalization: no Has patient had a PCN reaction occurring within the last 10 years: no If all of the above answers are "NO", then may proceed with Cephalosporin use.   . Sulfa Antibiotics Rash    No orders of the defined types were placed in this encounter.   AUTHORIZATION INFORMATION Primary Insurance:  ID #:   Group #:  Pre-Cert / Auth required: Pre-Cert / Auth #:   Secondary Insurance:   ID #:  Group #:  Pre-Cert / Auth  required: Pre-Cert / Auth #:   SCHEDULE INFORMATION: Procedure has been scheduled as follows:  Date:   05/10/2016           Time: 10:30 Am  Location: [facility] Stay  This Gastroenterology Pre-Precedure Review Form is being routed to the following provider(s): Jonette Eva, MD

## 2016-04-29 ENCOUNTER — Other Ambulatory Visit: Payer: Self-pay

## 2016-04-29 DIAGNOSIS — Z1211 Encounter for screening for malignant neoplasm of colon: Secondary | ICD-10-CM

## 2016-04-29 MED ORDER — PEG 3350-KCL-NA BICARB-NACL 420 G PO SOLR: 4000.0000 mL | ORAL | 0 refills | Status: DC

## 2016-04-29 NOTE — Telephone Encounter (Signed)
Rx sent to the pharmacy and instructions mailed to pt.  

## 2016-05-06 ENCOUNTER — Telehealth: Payer: Self-pay

## 2016-05-06 NOTE — Telephone Encounter (Signed)
I called [employer] @ [phone] and spoke to Shelba Flakeicole G who said that a PA is not required for the screening colonosocpy.  Ref # CDR  [id number].

## 2016-05-10 ENCOUNTER — Ambulatory Visit (HOSPITAL_COMMUNITY)
Admission: RE | Admit: 2016-05-10 | Discharge: 2016-05-10 | Disposition: A | Discharge status: Home / Self Care | Payer: Medicare HMO | Source: Ambulatory Visit | Attending: Gastroenterology | Admitting: Gastroenterology

## 2016-05-10 ENCOUNTER — Encounter (HOSPITAL_COMMUNITY)
Admission: RE | Disposition: A | Discharge status: Home / Self Care | Payer: Self-pay | Source: Ambulatory Visit | Attending: Gastroenterology

## 2016-05-10 ENCOUNTER — Encounter (HOSPITAL_COMMUNITY): Payer: Self-pay | Admitting: *Deleted

## 2016-05-10 DIAGNOSIS — E162 Hypoglycemia, unspecified: Secondary | ICD-10-CM | POA: Insufficient documentation

## 2016-05-10 DIAGNOSIS — K644 Residual hemorrhoidal skin tags: Secondary | ICD-10-CM | POA: Diagnosis not present

## 2016-05-10 DIAGNOSIS — F419 Anxiety disorder, unspecified: Secondary | ICD-10-CM | POA: Diagnosis not present

## 2016-05-10 DIAGNOSIS — J449 Chronic obstructive pulmonary disease, unspecified: Secondary | ICD-10-CM | POA: Insufficient documentation

## 2016-05-10 DIAGNOSIS — I509 Heart failure, unspecified: Secondary | ICD-10-CM | POA: Insufficient documentation

## 2016-05-10 DIAGNOSIS — G20 Parkinson's disease: Secondary | ICD-10-CM | POA: Insufficient documentation

## 2016-05-10 DIAGNOSIS — K648 Other hemorrhoids: Secondary | ICD-10-CM | POA: Diagnosis not present

## 2016-05-10 DIAGNOSIS — F329 Major depressive disorder, single episode, unspecified: Secondary | ICD-10-CM | POA: Insufficient documentation

## 2016-05-10 DIAGNOSIS — Z79899 Other long term (current) drug therapy: Secondary | ICD-10-CM | POA: Insufficient documentation

## 2016-05-10 DIAGNOSIS — Z1211 Encounter for screening for malignant neoplasm of colon: Secondary | ICD-10-CM | POA: Diagnosis not present

## 2016-05-10 DIAGNOSIS — Z87891 Personal history of nicotine dependence: Secondary | ICD-10-CM | POA: Insufficient documentation

## 2016-05-10 DIAGNOSIS — Z7984 Long term (current) use of oral hypoglycemic drugs: Secondary | ICD-10-CM | POA: Diagnosis not present

## 2016-05-10 DIAGNOSIS — E119 Type 2 diabetes mellitus without complications: Secondary | ICD-10-CM | POA: Diagnosis not present

## 2016-05-10 DIAGNOSIS — Z7982 Long term (current) use of aspirin: Secondary | ICD-10-CM | POA: Insufficient documentation

## 2016-05-10 DIAGNOSIS — Z1212 Encounter for screening for malignant neoplasm of rectum: Secondary | ICD-10-CM | POA: Diagnosis not present

## 2016-05-10 DIAGNOSIS — I110 Hypertensive heart disease with heart failure: Secondary | ICD-10-CM | POA: Insufficient documentation

## 2016-05-10 HISTORY — PX: COLONOSCOPY: SHX5424

## 2016-05-10 LAB — GLUCOSE, CAPILLARY: Glucose-Capillary: 117 mg/dL — ABNORMAL HIGH (ref 65–99)

## 2016-05-10 SURGERY — COLONOSCOPY
Anesthesia: Moderate Sedation

## 2016-05-10 MED ORDER — SODIUM CHLORIDE 0.9 % IV SOLN
INTRAVENOUS | Status: DC
  Administered 2016-05-10: 10:00:00 via INTRAVENOUS

## 2016-05-10 MED ORDER — MIDAZOLAM HCL 5 MG/5ML IJ SOLN
INTRAMUSCULAR | Status: DC | PRN
  Administered 2016-05-10 (×2): 2 mg via INTRAVENOUS

## 2016-05-10 MED ORDER — MEPERIDINE HCL 100 MG/ML IJ SOLN
INTRAMUSCULAR | Status: AC
  Filled 2016-05-10: qty 1

## 2016-05-10 MED ORDER — MIDAZOLAM HCL 5 MG/5ML IJ SOLN
INTRAMUSCULAR | Status: AC
  Filled 2016-05-10: qty 5

## 2016-05-10 MED ORDER — MEPERIDINE HCL 100 MG/ML IJ SOLN
INTRAMUSCULAR | Status: DC | PRN
Start: 2016-05-10 — End: 2016-05-10
  Administered 2016-05-10 (×2): 50 mg via INTRAVENOUS

## 2016-05-10 NOTE — Op Note (Signed)
[facility] Patient Name: Allison Rollins Procedure Date: 05/10/2016 10:39 AM MRN: [id number] Date of Birth: [birth year] Attending MD: Jonette Eva , MD CSN: [id number] Age: 66 Admit Type: Outpatient Procedure:                Colonoscopy, SCREENING Indications:              Screening for colorectal malignant neoplasm Providers:                Jonette Eva, MD, Jannett Celestine, RN, Dyann Ruddle Referring MD:             Rex Kras, MD Medicines:                Meperidine 100 mg IV, Midazolam 4 mg IV Complications:            No immediate complications. Estimated Blood Loss:     Estimated blood loss: none. Procedure:                Pre-Anesthesia Assessment:                           - Prior to the procedure, a History and Physical                            was performed, and patient medications and                            allergies were reviewed. The patient's tolerance of                            previous anesthesia was also reviewed. The risks                            and benefits of the procedure and the sedation                            options and risks were discussed with the patient.                            All questions were answered, and informed consent                            was obtained. Prior Anticoagulants: The patient has                            taken aspirin, last dose was 1 day prior to                            procedure. ASA Grade Assessment: II - A patient                            with mild systemic disease. After reviewing the                            risks and benefits, the patient was deemed in  satisfactory condition to undergo the procedure.                            After obtaining informed consent, the colonoscope                            was passed under direct vision. Throughout the                            procedure, the patient's blood pressure, pulse, and                            oxygen saturations  were monitored continuously. The                            EC-[id number] ([id number] scope was introduced through                            the anus and advanced to the the cecum, identified                            by appendiceal orifice and ileocecal valve. The                            ileocecal valve, appendiceal orifice, and rectum                            were photographed. The colonoscopy was performed                            with ease. The patient tolerated the procedure                            well. The quality of the bowel preparation was                            excellent. Scope In: 11:06:00 AM Scope Out: 11:18:49 AM Scope Withdrawal Time: 0 hours 10 minutes 55 seconds  Total Procedure Duration: 0 hours 12 minutes 49 seconds  Findings:      The colon (entire examined portion) appeared normal.      External and internal hemorrhoids were found during retroflexion. The       hemorrhoids were small. Impression:               - The entire examined colon is normal.                           - External and internal hemorrhoids. Moderate Sedation:      Moderate (conscious) sedation was administered by the endoscopy nurse       and supervised by the endoscopist. The following parameters were       monitored: oxygen saturation, heart rate, blood pressure, and response       to care. Total physician intraservice time was 27 minutes. Recommendation:           - High fiber diet.                           -  Continue present medications.                           - Repeat colonoscopy in 10 years for surveillance.                           - Patient has a contact number available for                            emergencies. The signs and symptoms of potential                            delayed complications were discussed with the                            patient. Return to normal activities tomorrow.                            Written discharge instructions were provided to the                             patient. Procedure Code(s):        --- Professional ---                           [phone], Colonoscopy, flexible; diagnostic, including                            collection of specimen(s) by brushing or washing,                            when performed (separate procedure)                           99152, Moderate sedation services provided by the                            same physician or other qualified health care                            professional performing the diagnostic or                            therapeutic service that the sedation supports,                            requiring the presence of an independent trained                            observer to assist in the monitoring of the                            patient's level of consciousness and physiological  status; initial 15 minutes of intraservice time,                            patient age 58 years or older                           [phone], Moderate sedation services; each additional                            15 minutes intraservice time Diagnosis Code(s):        --- Professional ---                           Z12.11, Encounter for screening for malignant                            neoplasm of colon                           K64.8, Other hemorrhoids CPT copyright 2016 [employer]. All rights reserved. The codes documented in this report are preliminary and upon coder review may  be revised to meet current compliance requirements. Jonette Eva, MD Jonette Eva, MD 05/10/2016 11:39:21 AM This report has been signed electronically. Number of Addenda: 0

## 2016-05-10 NOTE — Discharge Instructions (Signed)
You have internal hemorrhoids. YOU DID NOT HAVE ANY POLYPS.   CONTINUE YOUR WEIGHT LOSS EFFORTS.  WHILE I DO NOT WANT TO ALARM YOU, YOUR BODY MASS INDEX IS OVER 30 WHICH MEANS YOU ARE OBESE. OBESITY IS ASSOCIATED WITH AN INCREASE RISK FOR ALL CANCERS, INCLUDING ESOPHAGEAL AND COLON CANCER.  DRINK WATER TO KEEP YOUR URINE LIGHT YELLOW.  FOLLOW A HIGH FIBER DIET. AVOID ITEMS THAT CAUSE BLOATING. SEE INFO BELOW.  USE PREPARATION H FOUR TIMES  A DAY IF NEEDED TO RELIEVE RECTAL PAIN/PRESSURE/BLEEDING.  Next colonoscopy in 10 years.  Colonoscopy Care After Read the instructions outlined below and refer to this sheet in the next week. These discharge instructions provide you with general information on caring for yourself after you leave the hospital. While your treatment has been planned according to the most current medical practices available, unavoidable complications occasionally occur. If you have any problems or questions after discharge, call DR. Joleigh Mineau, [phone].  ACTIVITY  You may resume your regular activity, but move at a slower pace for the next 24 hours.   Take frequent rest periods for the next 24 hours.   Walking will help get rid of the air and reduce the bloated feeling in your belly (abdomen).   No driving for 24 hours (because of the medicine (anesthesia) used during the test).   You may shower.   Do not sign any important legal documents or operate any machinery for 24 hours (because of the anesthesia used during the test).    NUTRITION  Drink plenty of fluids.   You may resume your normal diet as instructed by your doctor.   Begin with a light meal and progress to your normal diet. Heavy or fried foods are harder to digest and may make you feel sick to your stomach (nauseated).   Avoid alcoholic beverages for 24 hours or as instructed.    MEDICATIONS  You may resume your normal medications.   WHAT YOU CAN EXPECT TODAY  Some feelings of bloating in  the abdomen.   Passage of more gas than usual.   Spotting of blood in your stool or on the toilet paper  .  IF YOU HAD POLYPS REMOVED DURING THE COLONOSCOPY:  Eat a soft diet IF YOU HAVE NAUSEA, BLOATING, ABDOMINAL PAIN, OR VOMITING.    FINDING OUT THE RESULTS OF YOUR TEST Not all test results are available during your visit. DR. Darrick PennaFIELDS WILL CALL YOU WITHIN 7 DAYS OF YOUR PROCEDUE WITH YOUR RESULTS. Do not assume everything is normal if you have not heard from DR. Sholanda Croson IN ONE WEEK, CALL HER OFFICE AT [phone].  SEEK IMMEDIATE MEDICAL ATTENTION AND CALL THE OFFICE: [phone] IF:  You have more than a spotting of blood in your stool.   Your belly is swollen (abdominal distention).   You are nauseated or vomiting.   You have a temperature over 101F.   You have abdominal pain or discomfort that is severe or gets worse throughout the day.  High-Fiber Diet A high-fiber diet changes your normal diet to include more whole grains, legumes, fruits, and vegetables. Changes in the diet involve replacing refined carbohydrates with unrefined foods. The calorie level of the diet is essentially unchanged. The Dietary Reference Intake (recommended amount) for adult males is 38 grams per day. For adult females, it is 25 grams per day. Pregnant and lactating women should consume 28 grams of fiber per day. Fiber is the intact part of a plant that is not broken down  during digestion. Functional fiber is fiber that has been isolated from the plant to provide a beneficial effect in the body.  PURPOSE  Increase stool bulk.   Ease and regulate bowel movements.   Lower cholesterol.   REDUCE RISK OF COLON CANCER  INDICATIONS THAT YOU NEED MORE FIBER  Constipation and hemorrhoids.   Uncomplicated diverticulosis (intestine condition) and irritable bowel syndrome.   Weight management.   As a protective measure against hardening of the arteries (atherosclerosis), diabetes, and cancer.    GUIDELINES FOR INCREASING FIBER IN THE DIET  Start adding fiber to the diet slowly. A gradual increase of about 5 more grams (2 slices of whole-wheat bread, 2 servings of most fruits or vegetables, or 1 bowl of high-fiber cereal) per day is best. Too rapid an increase in fiber may result in constipation, flatulence, and bloating.   Drink enough water and fluids to keep your urine clear or pale yellow. Water, juice, or caffeine-free drinks are recommended. Not drinking enough fluid may cause constipation.   Eat a variety of high-fiber foods rather than one type of fiber.   Try to increase your intake of fiber through using high-fiber foods rather than fiber pills or supplements that contain small amounts of fiber.   The goal is to change the types of food eaten. Do not supplement your present diet with high-fiber foods, but replace foods in your present diet.   INCLUDE A VARIETY OF FIBER SOURCES  Replace refined and processed grains with whole grains, canned fruits with fresh fruits, and incorporate other fiber sources. White rice, white breads, and most bakery goods contain little or no fiber.   Brown whole-grain rice, buckwheat oats, and many fruits and vegetables are all good sources of fiber. These include: broccoli, Brussels sprouts, cabbage, cauliflower, beets, sweet potatoes, white potatoes (skin on), carrots, tomatoes, eggplant, squash, berries, fresh fruits, and dried fruits.   Cereals appear to be the richest source of fiber. Cereal fiber is found in whole grains and bran. Bran is the fiber-rich outer coat of cereal grain, which is largely removed in refining. In whole-grain cereals, the bran remains. In breakfast cereals, the largest amount of fiber is found in those with "bran" in their names. The fiber content is sometimes indicated on the label.   You may need to include additional fruits and vegetables each day.   In baking, for 1 cup white flour, you may use the following  substitutions:   1 cup whole-wheat flour minus 2 tablespoons.   1/2 cup white flour plus 1/2 cup whole-wheat flour.   Hemorrhoids Hemorrhoids are dilated (enlarged) veins around the rectum. Sometimes clots will form in the veins. This makes them swollen and painful. These are called thrombosed hemorrhoids. Causes of hemorrhoids include:  Constipation.   Straining to have a bowel movement.   HEAVY LIFTING  HOME CARE INSTRUCTIONS  Eat a well balanced diet and drink 6 to 8 glasses of water every day to avoid constipation. You may also use a bulk laxative.   Avoid straining to have bowel movements.   Keep anal area dry and clean.   Do not use a donut shaped pillow or sit on the toilet for long periods. This increases blood pooling and pain.   Move your bowels when your body has the urge; this will require less straining and will decrease pain and pressure.

## 2016-05-10 NOTE — H&P (Signed)
Primary Care Physician:  [name], MD Primary Gastroenterologist:  Dr. Darrick Penna  Pre-Procedure History & Physical: HPI:  Allison Rollins is a 66 y.o. female here for COLON CANCER SCREENING.  Past Medical History:  Diagnosis Date  . Anxiety   . Asthma   . CHF (congestive heart failure) (HCC)   . COPD (chronic obstructive pulmonary disease) (HCC)   . Depression   . Diabetes mellitus without complication (HCC)   . HTN (hypertension)   . Hypoglycemia   . Kidney stones   . Parkinson's disease   . Pneumonia   . S/P colonoscopy 10/15/04   normal    Past Surgical History:  Procedure Laterality Date  . CHOLECYSTECTOMY    . COLONOSCOPY  10/15/2004   Normal rectum/Normal colon  . ESOPHAGOGASTRODUODENOSCOPY  09/11/2010   Dysphagia likely multifactorial (possible Candida esophagitis, likely nonspecific esophageal motility disorder, and/or uncontrolled gastroesophageal reflux disease), status post empiric dilation  . KIDNEY STONE SURGERY      Prior to Admission medications   Medication Sig Start Date End Date Taking? Authorizing Provider  albuterol (PROVENTIL HFA;VENTOLIN HFA) 108 (90 BASE) MCG/ACT inhaler Inhale 1-2 puffs into the lungs every 6 (six) hours as needed for wheezing. 01/12/13  Yes [name], MD  aspirin EC 81 MG tablet Take 1 tablet (81 mg total) by mouth daily. 12/23/15  Yes Erick Blinks, MD  citalopram (CELEXA) 20 MG tablet Take 2 tablets (40 mg total) by mouth daily. 03/29/16  Yes [name], MD  ergocalciferol (VITAMIN D2) 50000 UNITS capsule Take 50,000 Units by mouth every Thursday.    Yes Historical Provider, MD  gabapentin (NEURONTIN) 300 MG capsule Take 300mg  in the morning, 600mg  at night Patient taking differently: Take 300-600 mg by mouth 2 (two) times daily. Take 300mg  in the morning, 600mg  at night 03/29/16  Yes [name], MD  lisinopril (PRINIVIL,ZESTRIL) 40 MG tablet TAKE ONE TABLET BY MOUTH ONCE DAILY 12/10/15  Yes Antoine Poche, MD   metFORMIN (GLUCOPHAGE) 500 MG tablet TAKE ONE TABLET BY MOUTH ONCE DAILY WITH BREAKFAST 12/01/15  Yes [name], MD  Omega-3 Fatty Acids (FISH OIL PO) Take 6 capsules by mouth daily.    Yes Historical Provider, MD  pantoprazole (PROTONIX) 40 MG tablet TAKE ONE TABLET BY MOUTH ONCE DAILY 04/15/16  Yes [name], MD  polyethylene glycol-electrolytes (TRILYTE) 420 g solution Take 4,000 mLs by mouth as directed. 04/29/16  Yes West Bali, MD  pravastatin (PRAVACHOL) 40 MG tablet Take 1 tablet (40 mg total) by mouth at bedtime. 03/29/16  Yes [name], MD  acetaminophen (TYLENOL) 500 MG tablet Take 500 mg by mouth every 6 (six) hours as needed for moderate pain.     Historical Provider, MD  furosemide (LASIX) 40 MG tablet Take 40 mg by mouth 2 (two) times daily as needed (for swelling/fluid).     Historical Provider, MD  nitroGLYCERIN (NITROSTAT) 0.4 MG SL tablet Place 1 tablet (0.4 mg total) under the tongue every 5 (five) minutes as needed for chest pain. Patient taking differently: Place 0.4 mg under the tongue every 5 (five) minutes as needed for chest pain. For chest pain. 12/25/15   [name], MD    Allergies as of 04/29/2016 - Review Complete 04/22/2016  Allergen Reaction Noted  . Penicillins Itching and Rash 09/08/2010  . Sulfa antibiotics Rash 07/01/2011    Family History  Problem Relation Age of Onset  . Coronary artery disease Father   .  COPD Mother   . Coronary artery disease Brother   . Coronary artery disease Sister     Social History   Social History  . Marital status: Widowed    Spouse name: N/A  . Number of children: 2  . Years of education: N/A   Occupational History  . disabled    Social History Main Topics  . Smoking status: Former Smoker    Packs/day: 0.25    Years: 30.00    Types: Cigarettes    Start date: 01/04/1965    Quit date: 01/03/1995  . Smokeless tobacco: Never Used  . Alcohol use No  . Drug use: No  . Sexual activity: Not on  file   Other Topics Concern  . Not on file   Social History Narrative  . No narrative on file    Review of Systems: See HPI, otherwise negative ROS   Physical Exam: BP (!) 149/76   Pulse 60   Temp 98.3 F (36.8 C) (Oral)   Resp (!) 25   Ht 5\' 6"  (1.676 m)   Wt 223 lb (101.2 kg)   SpO2 94%   BMI 35.99 kg/m  General:   Alert,  pleasant and cooperative in NAD Head:  Normocephalic and atraumatic. Neck:  Supple; Lungs:  Clear throughout to auscultation.    Heart:  Regular rate and rhythm. Abdomen:  Soft, nontender and nondistended. Normal bowel sounds, without guarding, and without rebound.   Neurologic:  Alert and  oriented x4;  grossly normal neurologically.  Impression/Plan:     SCREENING  Plan:  1. TCS TODAY. DISCUSSED PROCEDURE, BENEFITS, & RISKS: < 1% chance of medication reaction, bleeding, perforation, or rupture of spleen/liver.

## 2016-05-12 ENCOUNTER — Encounter (HOSPITAL_COMMUNITY): Payer: Self-pay | Admitting: Gastroenterology

## 2016-05-19 ENCOUNTER — Other Ambulatory Visit: Payer: Self-pay

## 2016-05-19 MED ORDER — BLOOD GLUCOSE METER KIT: PACK | 0 refills | Status: DC

## 2016-05-30 ENCOUNTER — Other Ambulatory Visit: Payer: Self-pay | Admitting: Pediatrics

## 2016-06-08 ENCOUNTER — Other Ambulatory Visit: Payer: Self-pay | Admitting: Cardiology

## 2016-06-28 ENCOUNTER — Ambulatory Visit (INDEPENDENT_AMBULATORY_CARE_PROVIDER_SITE_OTHER): Payer: Medicare HMO | Admitting: Pediatrics

## 2016-06-28 ENCOUNTER — Encounter: Payer: Self-pay | Admitting: Pediatrics

## 2016-06-28 VITALS — BP 131/77 | HR 55 | Temp 97.9°F | Ht 66.0 in | Wt 227.2 lb

## 2016-06-28 DIAGNOSIS — F329 Major depressive disorder, single episode, unspecified: Secondary | ICD-10-CM | POA: Diagnosis not present

## 2016-06-28 DIAGNOSIS — G629 Polyneuropathy, unspecified: Secondary | ICD-10-CM | POA: Diagnosis not present

## 2016-06-28 DIAGNOSIS — E782 Mixed hyperlipidemia: Secondary | ICD-10-CM

## 2016-06-28 DIAGNOSIS — R7303 Prediabetes: Secondary | ICD-10-CM

## 2016-06-28 DIAGNOSIS — F32A Depression, unspecified: Secondary | ICD-10-CM

## 2016-06-28 DIAGNOSIS — E0810 Diabetes mellitus due to underlying condition with ketoacidosis without coma: Secondary | ICD-10-CM | POA: Diagnosis not present

## 2016-06-28 DIAGNOSIS — K219 Gastro-esophageal reflux disease without esophagitis: Secondary | ICD-10-CM | POA: Diagnosis not present

## 2016-06-28 LAB — BAYER DCA HB A1C WAIVED: HB A1C (BAYER DCA - WAIVED): 5.9 % (ref <7.0)

## 2016-06-28 MED ORDER — GABAPENTIN 300 MG PO CAPS: 300.0000 mg | ORAL_CAPSULE | Freq: Two times a day (BID) | ORAL | 1 refills | Status: DC

## 2016-06-28 NOTE — Progress Notes (Addendum)
  Subjective:   Patient ID: Allison Rollins, female    DOB: [birth year], 66 y.o.   MRN: [id number] CC: Follow-up (3 month) mulitple med problems HPI: Allison Rollins is a 66 y.o. female presenting for Follow-up (3 month)  Prediabetes: avoiding sugary foods Doesn't check BGls at home  Neuropathy: comes and goes with symptoms Gabapentin helping some Has been followed by neurology in th epast  Elevated BMI:  Interested in walking more Active at work  Depression: On 40mg  citalopram, thinks has been helping more since increase 3 mo Husband passed away in the last year Still paying for his medical bills Says her mood has been getting better Feels safe at home  HLD: taking pravastatin daily, no s/e  GER: on PPI Has symptoms if she misses multiple doses  Relevant past medical, surgical, family and social history reviewed. Allergies and medications reviewed and updated. History  Smoking Status  . Former Smoker  . Packs/day: 0.25  . Years: 30.00  . Types: Cigarettes  . Start date: 01/04/1965  . Quit date: 01/03/1995  Smokeless Tobacco  . Never Used   ROS: Per HPI   Objective:    BP 131/77   Pulse (!) 55   Temp 97.9 F (36.6 C) (Oral)   Ht 5\' 6"  (1.676 m)   Wt 227 lb 3.2 oz (103.1 kg)   BMI 36.67 kg/m   Wt Readings from Last 3 Encounters:  06/28/16 227 lb 3.2 oz (103.1 kg)  05/10/16 223 lb (101.2 kg)  03/29/16 236 lb (107 kg)    Gen: NAD, alert, cooperative with exam, NCAT EYES: EOMI, no conjunctival injection, or no icterus ENT:  TMs pearly gray b/l, OP without erythema LYMPH: no cervical LAD CV: NRRR, normal S1/S2, no murmur Resp: CTABL, no wheezes, normal WOB Abd: +BS, soft, NTND. no guarding or organomegaly Ext: No edema, warm Neuro: Alert and oriented MSK: normal muscle bulk  Assessment & Plan:  Allison Rollins was seen today for follow-up multiple med problems  Diagnoses and all orders for this visit:  Mixed hyperlipidemia Stable, Cont statin  Neuropathy  (HCC) Gabapentin helps with symptoms Has been following with neurology -     gabapentin (NEURONTIN) 300 MG capsule; Take 1-2 capsules (300-600 mg total) by mouth 2 (two) times daily. Take 300mg  in the morning, 600mg  at night -     Ambulatory referral to Neurology  Prediabetes Discussed lifestyle changes, decreasing sugar intake, increasing physicla activity A1c 5.9 today, up slightly  Depression Stable, On lexapro, continue  Gastroesophageal reflux disease, esophagitis presence not specified Stable, cont PPI. Has symptoms if she misses dose of pantoprazole  Follow up plan: Return in about 3 months (around 09/28/2016). Rex Krasarol Cray Monnin, MD [facility]  ADDENDUM: CO2 levels have been persistently high Snoring, morning headaches, feeling tired in the morning, not wanting to wake up Neurologist has wanted her to have sleep study done in the past, pt open to discussing again with him next visit, husband has since passed away no longer needing 24h care

## 2016-06-29 LAB — BMP8+EGFR
BUN/Creatinine Ratio: 15 (ref 12–28)
BUN: 12 mg/dL (ref 8–27)
CO2: 30 mmol/L — ABNORMAL HIGH (ref 18–29)
Calcium: 9.4 mg/dL (ref 8.7–10.3)
Chloride: 101 mmol/L (ref 96–106)
Creatinine, Ser: 0.79 mg/dL (ref 0.57–1.00)
GFR calc Af Amer: 90 mL/min/{1.73_m2} (ref >59)
GFR calc non Af Amer: 78 mL/min/{1.73_m2} (ref >59)
Glucose: 81 mg/dL (ref 65–99)
Potassium: 4.8 mmol/L (ref 3.5–5.2)
Sodium: 145 mmol/L — ABNORMAL HIGH (ref 134–144)

## 2016-07-01 ENCOUNTER — Other Ambulatory Visit: Payer: Self-pay | Admitting: Pediatrics

## 2016-07-01 DIAGNOSIS — E559 Vitamin D deficiency, unspecified: Secondary | ICD-10-CM

## 2016-07-01 MED ORDER — ERGOCALCIFEROL 1.25 MG (50000 UT) PO CAPS: 50000.0000 [IU] | ORAL_CAPSULE | ORAL | 0 refills | Status: DC

## 2016-07-07 ENCOUNTER — Other Ambulatory Visit: Payer: Self-pay | Admitting: Pediatrics

## 2016-07-07 DIAGNOSIS — F329 Major depressive disorder, single episode, unspecified: Secondary | ICD-10-CM

## 2016-07-07 DIAGNOSIS — F32A Depression, unspecified: Secondary | ICD-10-CM

## 2016-07-21 DIAGNOSIS — I10 Essential (primary) hypertension: Secondary | ICD-10-CM | POA: Diagnosis not present

## 2016-07-21 DIAGNOSIS — G2581 Restless legs syndrome: Secondary | ICD-10-CM | POA: Diagnosis not present

## 2016-07-21 DIAGNOSIS — R251 Tremor, unspecified: Secondary | ICD-10-CM | POA: Diagnosis not present

## 2016-07-21 DIAGNOSIS — G4733 Obstructive sleep apnea (adult) (pediatric): Secondary | ICD-10-CM | POA: Diagnosis not present

## 2016-07-21 DIAGNOSIS — E6609 Other obesity due to excess calories: Secondary | ICD-10-CM | POA: Diagnosis not present

## 2016-07-21 DIAGNOSIS — I519 Heart disease, unspecified: Secondary | ICD-10-CM | POA: Diagnosis not present

## 2016-07-21 DIAGNOSIS — R2689 Other abnormalities of gait and mobility: Secondary | ICD-10-CM | POA: Diagnosis not present

## 2016-07-21 DIAGNOSIS — E785 Hyperlipidemia, unspecified: Secondary | ICD-10-CM | POA: Diagnosis not present

## 2016-07-21 DIAGNOSIS — G4709 Other insomnia: Secondary | ICD-10-CM | POA: Diagnosis not present

## 2016-07-28 ENCOUNTER — Other Ambulatory Visit: Payer: Self-pay

## 2016-07-28 MED ORDER — FUROSEMIDE 40 MG PO TABS: 40.0000 mg | ORAL_TABLET | Freq: Two times a day (BID) | ORAL | 3 refills | Status: DC | PRN

## 2016-08-02 ENCOUNTER — Encounter (INDEPENDENT_AMBULATORY_CARE_PROVIDER_SITE_OTHER): Payer: Self-pay

## 2016-08-02 ENCOUNTER — Ambulatory Visit (INDEPENDENT_AMBULATORY_CARE_PROVIDER_SITE_OTHER): Payer: Medicare HMO | Admitting: Physician Assistant

## 2016-08-02 ENCOUNTER — Encounter: Payer: Self-pay | Admitting: Physician Assistant

## 2016-08-02 VITALS — BP 107/69 | HR 64 | Temp 98.4°F | Ht 66.0 in | Wt 234.4 lb

## 2016-08-02 DIAGNOSIS — R5383 Other fatigue: Secondary | ICD-10-CM

## 2016-08-02 NOTE — Progress Notes (Signed)
BP 107/69   Pulse 64   Temp 98.4 F (36.9 C) (Oral)   Ht 5' 6"  (1.676 m)   Wt 234 lb 6.4 oz (106.3 kg)   BMI 37.83 kg/m    Subjective:    Patient ID: Allison Rollins, female    DOB: Jun 02, 1950, 66 y.o.   MRN: [id number]  HPI: ELISABETH STROM is a 66 y.o. female presenting on 08/02/2016 for Fatigue; Leg Swelling; and Nausea Patient reports snoring, daytime drowsiness. Denies sleepiness with driving. Husband witnessed apneas. Never feels rested in the AM.  Long discussion about the benefits of diagnosing sleep apnea and treating it. Patient reports she will reschedule her study.  Felt weak when she took the Lasix BID, and BP id excellent today and no meds for 2 days, recommended lowering dose.  Relevant past medical, surgical, family and social history reviewed and updated as indicated. Allergies and medications reviewed and updated.  Past Medical History:  Diagnosis Date  . Anxiety   . Asthma   . CHF (congestive heart failure) ([place])   . COPD (chronic obstructive pulmonary disease) ([place])   . Depression   . Diabetes mellitus without complication ([place])   . HTN (hypertension)   . Hypoglycemia   . Kidney stones   . Parkinson's disease   . Pneumonia   . S/P colonoscopy 10/15/04   normal    Past Surgical History:  Procedure Laterality Date  . CHOLECYSTECTOMY    . COLONOSCOPY  10/15/2004   Normal rectum/Normal colon  . COLONOSCOPY N/A 05/10/2016   Procedure: COLONOSCOPY;  Surgeon: Danie Binder, MD;  [facility];  Service: Endoscopy;  Laterality: N/A;  10:30  . ESOPHAGOGASTRODUODENOSCOPY  09/11/2010   Dysphagia likely multifactorial (possible Candida esophagitis, likely nonspecific esophageal motility disorder, and/or uncontrolled gastroesophageal reflux disease), status post empiric dilation  . KIDNEY STONE SURGERY      Review of Systems  Constitutional: Positive for fatigue. Negative for activity change, chills and fever.  HENT: Negative.   Eyes: Negative.     Respiratory: Negative.  Negative for cough, shortness of breath and wheezing.   Cardiovascular: Negative.  Negative for chest pain and leg swelling.  Gastrointestinal: Negative.  Negative for abdominal pain.  Endocrine: Negative.   Genitourinary: Negative.  Negative for dysuria.  Musculoskeletal: Negative.  Negative for arthralgias.  Skin: Negative.   Neurological: Positive for weakness. Negative for dizziness, tremors, seizures, syncope, numbness and headaches.    Allergies as of 08/02/2016      Reactions   Penicillins Itching, Rash   Has patient had a PCN reaction causing immediate rash, facial/tongue/throat swelling, SOB or lightheadedness with hypotension: no Has patient had a PCN reaction causing severe rash involving mucus membranes or skin necrosis: no Has patient had a PCN reaction that required hospitalization: no Has patient had a PCN reaction occurring within the last 10 years: no If all of the above answers are "NO", then may proceed with Cephalosporin use.   Sulfa Antibiotics Rash      Medication List       Accurate as of 08/02/16  5:48 PM. Always use your most recent med list.          acetaminophen 500 MG tablet Commonly known as:  TYLENOL Take 500 mg by mouth every 6 (six) hours as needed for moderate pain.   albuterol 108 (90 Base) MCG/ACT inhaler Commonly known as:  PROVENTIL HFA;VENTOLIN HFA Inhale 1-2 puffs into the lungs every 6 (six) hours as needed for  wheezing.   aspirin EC 81 MG tablet Take 1 tablet (81 mg total) by mouth daily.   blood glucose meter kit and supplies Accu-check Nano smartview meter   citalopram 20 MG tablet Commonly known as:  CELEXA TAKE TWO TABLETS BY MOUTH ONCE DAILY   ergocalciferol 50000 units capsule Commonly known as:  VITAMIN D2 Take 1 capsule (50,000 Units total) by mouth every Thursday.   FISH OIL PO Take 6 capsules by mouth daily.   furosemide 40 MG tablet Commonly known as:  LASIX Take 1 tablet (40 mg total)  by mouth 2 (two) times daily as needed (for swelling/fluid).   gabapentin 300 MG capsule Commonly known as:  NEURONTIN Take 1-2 capsules (300-600 mg total) by mouth 2 (two) times daily. Take 350m in the morning, 6070mat night   lisinopril 40 MG tablet Commonly known as:  PRINIVIL,ZESTRIL TAKE ONE TABLET BY MOUTH ONCE DAILY   metFORMIN 500 MG tablet Commonly known as:  GLUCOPHAGE TAKE ONE TABLET BY MOUTH ONCE DAILY WITH BREAKFAST   nitroGLYCERIN 0.4 MG SL tablet Commonly known as:  NITROSTAT Place 1 tablet (0.4 mg total) under the tongue every 5 (five) minutes as needed for chest pain.   pantoprazole 40 MG tablet Commonly known as:  PROTONIX TAKE ONE TABLET BY MOUTH ONCE DAILY   pravastatin 40 MG tablet Commonly known as:  PRAVACHOL Take 1 tablet (40 mg total) by mouth at bedtime.          Objective:    BP 107/69   Pulse 64   Temp 98.4 F (36.9 C) (Oral)   Ht 5' 6"  (1.676 m)   Wt 234 lb 6.4 oz (106.3 kg)   BMI 37.83 kg/m   Allergies  Allergen Reactions  . Penicillins Itching and Rash    Has patient had a PCN reaction causing immediate rash, facial/tongue/throat swelling, SOB or lightheadedness with hypotension: no Has patient had a PCN reaction causing severe rash involving mucus membranes or skin necrosis: no Has patient had a PCN reaction that required hospitalization: no Has patient had a PCN reaction occurring within the last 10 years: no If all of the above answers are "NO", then may proceed with Cephalosporin use.   . Sulfa Antibiotics Rash    Physical Exam  Constitutional: She is oriented to person, place, and time. She appears well-developed and well-nourished.  HENT:  Head: Normocephalic and atraumatic.  Eyes: Conjunctivae and EOM are normal. Pupils are equal, round, and reactive to light.  Cardiovascular: Normal rate, regular rhythm, normal heart sounds and intact distal pulses.   Pulmonary/Chest: Effort normal and breath sounds normal.   Abdominal: Soft. Bowel sounds are normal.  Neurological: She is alert and oriented to person, place, and time. She has normal reflexes.  Skin: Skin is warm and dry. No rash noted.  Psychiatric: She has a normal mood and affect. Her behavior is normal. Judgment and thought content normal.  Nursing note and vitals reviewed.   Results for orders placed or performed in visit on 06/28/16  Bayer DCA Hb A1c Waived  Result Value Ref Range   Bayer DCA Hb A1c Waived 5.9 <7.0 %  BMP8+EGFR  Result Value Ref Range   Glucose 81 65 - 99 mg/dL   BUN 12 8 - 27 mg/dL   Creatinine, Ser 0.79 0.57 - 1.00 mg/dL   GFR calc non Af Amer 78 >59 mL/min/1.73   GFR calc Af Amer 90 >59 mL/min/1.73   BUN/Creatinine Ratio 15 12 - 28  Sodium 145 (H) 134 - 144 mmol/L   Potassium 4.8 3.5 - 5.2 mmol/L   Chloride 101 96 - 106 mmol/L   CO2 30 (H) 18 - 29 mmol/L   Calcium 9.4 8.7 - 10.3 mg/dL      Assessment & Plan:   1. Fatigue, unspecified type Encouraged patient to get the sleep study scheduled as recommended by PCP and neurology.  Take lasix 1/2-1 tab QD if needed, do not take every day.   Continue all other maintenance medications as listed above.  Follow up plan: Return if symptoms worsen or fail to improve.  Educational handout given for sleep apnea  Terald Sleeper PA-C [place] [address]  [place], [place] [address] [phone]   08/02/2016, 5:48 PM

## 2016-08-02 NOTE — Patient Instructions (Signed)

## 2016-08-03 ENCOUNTER — Other Ambulatory Visit (HOSPITAL_BASED_OUTPATIENT_CLINIC_OR_DEPARTMENT_OTHER): Payer: Self-pay

## 2016-08-03 DIAGNOSIS — G4733 Obstructive sleep apnea (adult) (pediatric): Secondary | ICD-10-CM

## 2016-09-07 ENCOUNTER — Encounter: Payer: Self-pay | Admitting: Cardiology

## 2016-09-07 ENCOUNTER — Ambulatory Visit (INDEPENDENT_AMBULATORY_CARE_PROVIDER_SITE_OTHER): Payer: Medicare HMO | Admitting: Cardiology

## 2016-09-07 VITALS — BP 117/74 | HR 58 | Ht 66.0 in | Wt 237.0 lb

## 2016-09-07 DIAGNOSIS — R001 Bradycardia, unspecified: Secondary | ICD-10-CM

## 2016-09-07 DIAGNOSIS — I5032 Chronic diastolic (congestive) heart failure: Secondary | ICD-10-CM | POA: Diagnosis not present

## 2016-09-07 DIAGNOSIS — R0789 Other chest pain: Secondary | ICD-10-CM | POA: Diagnosis not present

## 2016-09-07 DIAGNOSIS — I10 Essential (primary) hypertension: Secondary | ICD-10-CM

## 2016-09-07 DIAGNOSIS — E782 Mixed hyperlipidemia: Secondary | ICD-10-CM

## 2016-09-07 NOTE — Patient Instructions (Signed)

## 2016-09-07 NOTE — Progress Notes (Signed)
Clinical Summary Allison Rollins is a 66 y.o.female seen today for follow up of the following medical problems.   1. Chronic diastolic heart failure - no SOB/DOE. Takes lasix as needed for leg swelling. Takes roughly 3 times a week.  2. HTN - she is compliant with meds - does not check her bp at home.  3. COPD - compliant with inhalers  4. Bradycardia - chronic sinus bradycardia  - denies any lightheadedness or dizziness since last visit  5. Hyperlipidemia - complantpravastatin 32m daily - labs followed by pcp  6. OSA screen - has appt with Dr Allison Rollins July   7. Chest pain  - isolated episode about 1 month ag. Pressure like feeling, better with deep breaths. 3/10 in severity. Tends to occur at rest. No other associated symptoms. No exertional symptoms. Lasts just a few minutes.  - admit 11/2015 with chest pain. Nuclear stress test without significant ischemia.  - reports episode 2 weeks ago. She reports she was looking at pictures at home of her passed. Started feeling numbness and tingling in chest, blurry vision, mild chest pain. Diagnosed with panic attack. -   - no recent chest pain since last visit.   8. Prediabetes - followed by pcp  SH: has 3 grandkids, 2 sons. Husband just passed this March. Works at [employer]  One grandson will be starting at [employer] 2 years. Granddaughter  Past Medical History:  Diagnosis Date  . Anxiety   . Asthma   . CHF (congestive heart failure) ([place]   . [place] disease) [place]   . Depression   . Diabetes [place] complication ([place]   . HTN (hypertension)   . Hypoglycemia   . Kidney stones   . Parkinson's disease   . Pneumonia   . S/P colonoscopy 10/15/04   normal     Allergies  Allergen Reactions  . Penicillins Itching and Rash    Has patient had a PCN reaction causing immediate rash, facial/tongue/throat swelling, SOB or lightheadedness with  hypotension: no Has patient had a PCN reaction causing severe rash involving mucus membranes or skin necrosis: no Has patient had a PCN reaction that required hospitalization: no Has patient had a PCN reaction occurring within the last 10 years: no If all of the above answers are "NO", then may proceed with Cephalosporin use.   . Sulfa Antibiotics Rash     Current Outpatient Prescriptions  Medication Sig Dispense Refill  . acetaminophen (TYLENOL) 500 MG tablet Take 500 mg by mouth every 6 (six) hours as needed for moderate pain.     .Marland Kitchenalbuterol (PROVENTIL HFA;VENTOLIN HFA) 108 (90 BASE) MCG/ACT inhaler Inhale 1-2 puffs into the lungs every 6 (six) hours as needed for wheezing. 1 Inhaler 0  . aspirin EC 81 MG tablet Take 1 tablet (81 mg total) by mouth daily.    . blood glucose meter kit and supplies Accu-check Nano smartview meter 1 each 0  . citalopram (CELEXA) 20 MG tablet TAKE TWO TABLETS BY MOUTH ONCE DAILY 180 tablet 0  . ergocalciferol (VITAMIN D2) 50000 units capsule Take 1 capsule (50,000 Units total) by mouth every Thursday. 12 capsule 0  . furosemide (LASIX) 40 MG tablet Take 1 tablet (40 mg total) by mouth 2 (two) times daily as needed (for swelling/fluid). (Patient not taking: Reported on 08/02/2016) 60 tablet 3  . gabapentin (NEURONTIN) 300 MG capsule Take 1-2 capsules (300-600 mg total) by mouth 2 (two) times daily.  Take 358m in the morning, 6058mat night 270 capsule 1  . lisinopril (PRINIVIL,ZESTRIL) 40 MG tablet TAKE ONE TABLET BY MOUTH ONCE DAILY 90 tablet 1  . metFORMIN (GLUCOPHAGE) 500 MG tablet TAKE ONE TABLET BY MOUTH ONCE DAILY WITH BREAKFAST 30 tablet 3  . nitroGLYCERIN (NITROSTAT) 0.4 MG SL tablet Place 1 tablet (0.4 mg total) under the tongue every 5 (five) minutes as needed for chest pain. 20 tablet 3  . Omega-3 Fatty Acids (FISH OIL PO) Take 6 capsules by mouth daily.     . pantoprazole (PROTONIX) 40 MG tablet TAKE ONE TABLET BY MOUTH ONCE DAILY 30 tablet 5  .  pravastatin (PRAVACHOL) 40 MG tablet Take 1 tablet (40 mg total) by mouth at bedtime. 90 tablet 3   No current facility-administered medications for this visit.      Past Surgical History:  Procedure Laterality Date  . CHOLECYSTECTOMY    . COLONOSCOPY  10/15/2004   Normal rectum/Normal colon  . COLONOSCOPY N/A 05/10/2016   Procedure: COLONOSCOPY;  Surgeon: Allison BinderMD;  [facility];  Service: Endoscopy;  Laterality: N/A;  10:30  . ESOPHAGOGASTRODUODENOSCOPY  09/11/2010   Dysphagia likely multifactorial (possible Candida esophagitis, likely nonspecific esophageal motility disorder, and/or uncontrolled gastroesophageal reflux disease), status post empiric dilation  . KIDNEY STONE SURGERY       Allergies  Allergen Reactions  . Penicillins Itching and Rash    Has patient had a PCN reaction causing immediate rash, facial/tongue/throat swelling, SOB or lightheadedness with hypotension: no Has patient had a PCN reaction causing severe rash involving mucus membranes or skin necrosis: no Has patient had a PCN reaction that required hospitalization: no Has patient had a PCN reaction occurring within the last 10 years: no If all of the above answers are "NO", then may proceed with Cephalosporin use.   . Sulfa Antibiotics Rash      Family History  Problem Relation Age of Onset  . Coronary artery disease Father   . COPD Mother   . Coronary artery disease Brother   . Coronary artery disease Sister      Social History Allison Rollins reports that she quit smoking about 21 years ago. Her smoking use included Cigarettes. She started smoking about 51 years ago. She has a 7.50 pack-year smoking history. She has never used smokeless tobacco. [name] that she does not drink alcohol.   Review of Systems CONSTITUTIONAL: No weight loss, fever, chills, weakness or fatigue.  HEENT: Eyes: No visual loss, blurred vision, double vision or yellow sclerae.No hearing loss,  sneezing, congestion, runny nose or sore throat.  SKIN: No rash or itching.  CARDIOVASCULAR: per hpi RESPIRATORY: No shortness of breath, cough or sputum.  GASTROINTESTINAL: No anorexia, nausea, vomiting or diarrhea. No abdominal pain or blood.  GENITOURINARY: No burning on urination, no polyuria NEUROLOGICAL: No headache, dizziness, syncope, paralysis, ataxia, numbness or tingling in the extremities. No change in bowel or bladder control.  MUSCULOSKELETAL: No muscle, back pain, joint pain or stiffness.  LYMPHATICS: No enlarged nodes. No history of splenectomy.  PSYCHIATRIC: No history of depression or anxiety.  ENDOCRINOLOGIC: No reports of sweating, cold or heat intolerance. No polyuria or polydipsia.  . Marland Kitchen Physical Examination Vitals:   09/07/16 0953  BP: 117/74  Pulse: (!) 58   Vitals:   09/07/16 0953  Weight: 237 lb (107.5 kg)  Height: 5' 6"  (1.676 m)    Gen: resting comfortably, no acute distress HEENT: no scleral icterus, pupils  equal round and reactive, no palptable cervical adenopathy,  CV: RRR, no m/r/g, no jvd Resp: Clear to auscultation bilaterally GI: abdomen is soft, non-tender, non-distended, normal bowel sounds, no hepatosplenomegaly MSK: extremities are warm, no edema.  Skin: warm, no rash Neuro:  no focal deficits Psych: appropriate affect   Diagnostic Studies  11/2013 Echo Study Conclusions  - Procedure narrative: Transthoracic echocardiography. Image quality was suboptimal. The study was technically difficult, as a result of poor sound wave transmission and body habitus. - Left ventricle: The cavity size was normal. Wall thickness was increased in a pattern of mild LVH. Systolic function was low normal. The estimated ejection fraction was approximately 50%. Images were inadequate for LV wall motion assessment, but no gross regional variation was noted. Features are consistent with a pseudonormal left ventricular filling pattern, with  concomitant abnormal relaxation and increased filling pressure (grade 2 diastolic dysfunction). Doppler parameters are consistent with both elevated ventricular end-diastolic filling pressure and elevated left atrial filling pressure. - Mitral valve: Mildly calcified annulus. Mildly thickened leaflets . There was mild regurgitation. - Left atrium: The atrium was moderately to severely dilated.  11/2015  Nuclear stress  There was no ST segment deviation noted during stress.  No T wave inversion was noted during stress.  Findings consistent with prior myocardial infarction.  This is a low risk study.  The left ventricular ejection fraction is mildly decreased (45-54%).   Small inferolateral wall infarct at mid and basal level EF estimated 50% but looks normal no ischemia    Assessment and Plan   1. Chronic diastolic heart failure - no recent symptoms, continue current meds  2. HTN - bp is at goal, continue current meds  3. Bradycardia - chronic sinus bradycardia that is asymptomatic - we will continue to monitor at this time.    4. Hyperlipidemia - continue current statin - request lipid panel results from pcp  5. Chest pain - long history of chest pain. Most recently admit 11/2015 with chest pain, negative nuclear stress test - no current symptoms, we will continue to monitor.      Arnoldo Lenis, M.D.

## 2016-09-18 DIAGNOSIS — I10 Essential (primary) hypertension: Secondary | ICD-10-CM | POA: Diagnosis not present

## 2016-09-18 DIAGNOSIS — R42 Dizziness and giddiness: Secondary | ICD-10-CM | POA: Diagnosis not present

## 2016-09-18 DIAGNOSIS — J301 Allergic rhinitis due to pollen: Secondary | ICD-10-CM | POA: Diagnosis not present

## 2016-09-20 ENCOUNTER — Other Ambulatory Visit: Payer: Self-pay | Admitting: Pediatrics

## 2016-09-27 ENCOUNTER — Telehealth: Payer: Self-pay | Admitting: Pediatrics

## 2016-09-27 NOTE — Telephone Encounter (Signed)
Scheduled

## 2016-09-30 ENCOUNTER — Ambulatory Visit (INDEPENDENT_AMBULATORY_CARE_PROVIDER_SITE_OTHER): Payer: Medicare HMO | Admitting: *Deleted

## 2016-09-30 VITALS — BP 135/81 | HR 56 | Resp 18 | Ht 64.0 in | Wt 238.6 lb

## 2016-09-30 DIAGNOSIS — R7303 Prediabetes: Secondary | ICD-10-CM

## 2016-09-30 DIAGNOSIS — Z0000 Encounter for general adult medical examination without abnormal findings: Secondary | ICD-10-CM

## 2016-09-30 NOTE — Patient Instructions (Addendum)
Allison Rollins , Thank you for taking time to come for your Medicare Wellness Visit. I appreciate your ongoing commitment to your health goals. Please review the following plan we discussed and let me know if I can assist you in the future.   These are the goals we discussed: Goals    None      This is a list of the screening recommended for you and due dates:  Health Maintenance  Topic Date Due  . Eye exam for diabetics  01/05/1960  . Tetanus Vaccine  01/04/1969  . DEXA scan (bone density measurement)  01/05/2015  . Flu Shot  11/24/2016  . Hemoglobin A1C  12/29/2016  . Complete foot exam   03/29/2017  . Mammogram  12/16/2017  . Colon Cancer Screening  05/10/2026  .  Hepatitis C: One time screening is recommended by Center for Disease Control  (CDC) for  adults born from [birth year] through 1966.   Completed  . Pneumonia vaccines  Completed      Diabetes and Foot Care Diabetes may cause you to have problems because of poor blood supply (circulation) to your feet and legs. This may cause the skin on your feet to become thinner, break easier, and heal more slowly. Your skin may become dry, and the skin may peel and crack. You may also have nerve damage in your legs and feet causing decreased feeling in them. You may not notice minor injuries to your feet that could lead to infections or more serious problems. Taking care of your feet is one of the most important things you can do for yourself. Follow these instructions at home:  Wear shoes at all times, even in the house. Do not go barefoot. Bare feet are easily injured.  Check your feet daily for blisters, cuts, and redness. If you cannot see the bottom of your feet, use a mirror or ask someone for help.  Wash your feet with warm water (do not use hot water) and mild soap. Then pat your feet and the areas between your toes until they are completely dry. Do not soak your feet as this can dry your skin.  Apply a moisturizing lotion or  petroleum jelly (that does not contain alcohol and is unscented) to the skin on your feet and to dry, brittle toenails. Do not apply lotion between your toes.  Trim your toenails straight across. Do not dig under them or around the cuticle. File the edges of your nails with an emery board or nail file.  Do not cut corns or calluses or try to remove them with medicine.  Wear clean socks or stockings every day. Make sure they are not too tight. Do not wear knee-high stockings since they may decrease blood flow to your legs.  Wear shoes that fit properly and have enough cushioning. To break in new shoes, wear them for just a few hours a day. This prevents you from injuring your feet. Always look in your shoes before you put them on to be sure there are no objects inside.  Do not cross your legs. This may decrease the blood flow to your feet.  If you find a minor scrape, cut, or break in the skin on your feet, keep it and the skin around it clean and dry. These areas may be cleansed with mild soap and water. Do not cleanse the area with peroxide, alcohol, or iodine.  When you remove an adhesive bandage, be sure not to damage the skin around  it.  If you have a wound, look at it several times a day to make sure it is healing.  Do not use heating pads or hot water bottles. They may burn your skin. If you have lost feeling in your feet or legs, you may not know it is happening until it is too late.  Make sure your health care provider performs a complete foot exam at least annually or more often if you have foot problems. Report any cuts, sores, or bruises to your health care provider immediately. Contact a health care provider if:  You have an injury that is not healing.  You have cuts or breaks in the skin.  You have an ingrown nail.  You notice redness on your legs or feet.  You feel burning or tingling in your legs or feet.  You have pain or cramps in your legs and feet.  Your legs or  feet are numb.  Your feet always feel cold. Get help right away if:  There is increasing redness, swelling, or pain in or around a wound.  There is a red line that goes up your leg.  Pus is coming from a wound.  You develop a fever or as directed by your health care provider.  You notice a bad smell coming from an ulcer or wound. This information is not intended to replace advice given to you by your health care provider. Make sure you discuss any questions you have with your health care provider. Document Released: 04/09/2000 Document Revised: 09/18/2015 Document Reviewed: 09/19/2012 Elsevier Interactive Patient Education  2017 [place] Prevention in the Home Falls can cause injuries. They can happen to people of all ages. There are many things you can do to make your home safe and to help prevent falls. What can I do on the outside of my home?  Regularly fix the edges of walkways and driveways and fix any cracks.  Remove anything that might make you trip as you walk through a door, such as a raised step or threshold.  Trim any bushes or trees on the path to your home.  Use bright outdoor lighting.  Clear any walking paths of anything that might make someone trip, such as rocks or tools.  Regularly check to see if handrails are loose or broken. Make sure that both sides of any steps have handrails.  Any raised decks and porches should have guardrails on the edges.  Have any leaves, snow, or ice cleared regularly.  Use sand or salt on walking paths during winter.  Clean up any spills in your garage right away. This includes oil or grease spills. What can I do in the bathroom?  Use night lights.  Install grab bars by the toilet and in the tub and shower. Do not use towel bars as grab bars.  Use non-skid mats or decals in the tub or shower.  If you need to sit down in the shower, use a plastic, non-slip stool.  Keep the floor dry. Clean up any water that  spills on the floor as soon as it happens.  Remove soap buildup in the tub or shower regularly.  Attach bath mats securely with double-sided non-slip rug tape.  Do not have throw rugs and other things on the floor that can make you trip. What can I do in the bedroom?  Use night lights.  Make sure that you have a light by your bed that is easy to reach.  Do  not use any sheets or blankets that are too big for your bed. They should not hang down onto the floor.  Have a firm chair that has side arms. You can use this for support while you get dressed.  Do not have throw rugs and other things on the floor that can make you trip. What can I do in the kitchen?  Clean up any spills right away.  Avoid walking on wet floors.  Keep items that you use a lot in easy-to-reach places.  If you need to reach something above you, use a strong step stool that has a grab bar.  Keep electrical cords out of the way.  Do not use floor polish or wax that makes floors slippery. If you must use wax, use non-skid floor wax.  Do not have throw rugs and other things on the floor that can make you trip. What can I do with my stairs?  Do not leave any items on the stairs.  Make sure that there are handrails on both sides of the stairs and use them. Fix handrails that are broken or loose. Make sure that handrails are as long as the stairways.  Check any carpeting to make sure that it is firmly attached to the stairs. Fix any carpet that is loose or worn.  Avoid having throw rugs at the top or bottom of the stairs. If you do have throw rugs, attach them to the floor with carpet tape.  Make sure that you have a light switch at the top of the stairs and the bottom of the stairs. If you do not have them, ask someone to add them for you. What else can I do to help prevent falls?  Wear shoes that: ? Do not have high heels. ? Have rubber bottoms. ? Are comfortable and fit you well. ? Are closed at the  toe. Do not wear sandals.  If you use a stepladder: ? Make sure that it is fully opened. Do not climb a closed stepladder. ? Make sure that both sides of the stepladder are locked into place. ? Ask someone to hold it for you, if possible.  Clearly mark and make sure that you can see: ? Any grab bars or handrails. ? First and last steps. ? Where the edge of each step is.  Use tools that help you move around (mobility aids) if they are needed. These include: ? Canes. ? Walkers. ? Scooters. ? Crutches.  Turn on the lights when you go into a dark area. Replace any light bulbs as soon as they burn out.  Set up your furniture so you have a clear path. Avoid moving your furniture around.  If any of your floors are uneven, fix them.  If there are any pets around you, be aware of where they are.  Review your medicines with your doctor. Some medicines can make you feel dizzy. This can increase your chance of falling. Ask your doctor what other things that you can do to help prevent falls. This information is not intended to replace advice given to you by your health care provider. Make sure you discuss any questions you have with your health care provider. Document Released: 02/06/2009 Document Revised: 09/18/2015 Document Reviewed: 05/17/2014 Elsevier Interactive Patient Education  2018 [place] Maintenance, Female Adopting a healthy lifestyle and getting preventive care can go a long way to promote health and wellness. Talk with your health care provider about what schedule of regular examinations  is right for you. This is a good chance for you to check in with your provider about disease prevention and staying healthy. In between checkups, there are plenty of things you can do on your own. Experts have done a lot of research about which lifestyle changes and preventive measures are most likely to keep you healthy. Ask your health care provider for more information. Weight  and diet Eat a healthy diet  Be sure to include plenty of vegetables, fruits, low-fat dairy products, and lean protein.  Do not eat a lot of foods high in solid fats, added sugars, or salt.  Get regular exercise. This is one of the most important things you can do for your health. ? Most adults should exercise for at least 150 minutes each week. The exercise should increase your heart rate and make you sweat (moderate-intensity exercise). ? Most adults should also do strengthening exercises at least twice a week. This is in addition to the moderate-intensity exercise.  Maintain a healthy weight  Body mass index (BMI) is a measurement that can be used to identify possible weight problems. It estimates body fat based on height and weight. Your health care provider can help determine your BMI and help you achieve or maintain a healthy weight.  For females 67 years of age and older: ? A BMI below 18.5 is considered underweight. ? A BMI of 18.5 to 24.9 is normal. ? A BMI of 25 to 29.9 is considered overweight. ? A BMI of 30 and above is considered obese.  Watch levels of cholesterol and blood lipids  You should start having your blood tested for lipids and cholesterol at 67 years of age, then have this test every 5 years.  You may need to have your cholesterol levels checked more often if: ? Your lipid or cholesterol levels are high. ? You are older than 67 years of age. ? You are at high risk for heart disease.  Cancer screening Lung Cancer  Lung cancer screening is recommended for adults 62-38 years old who are at high risk for lung cancer because of a history of smoking.  A yearly low-dose CT scan of the lungs is recommended for people who: ? Currently smoke. ? Have quit within the past 15 years. ? Have at least a 30-pack-year history of smoking. A pack year is smoking an average of one pack of cigarettes a day for 1 year.  Yearly screening should continue until it has been 15  years since you quit.  Yearly screening should stop if you develop a health problem that would prevent you from having lung cancer treatment.  Breast Cancer  Practice breast self-awareness. This means understanding how your breasts normally appear and feel.  It also means doing regular breast self-exams. Let your health care provider know about any changes, no matter how small.  If you are in your 20s or 30s, you should have a clinical breast exam (CBE) by a health care provider every 1-3 years as part of a regular health exam.  If you are 35 or older, have a CBE every year. Also consider having a breast X-ray (mammogram) every year.  If you have a family history of breast cancer, talk to your health care provider about genetic screening.  If you are at high risk for breast cancer, talk to your health care provider about having an MRI and a mammogram every year.  Breast cancer gene (BRCA) assessment is recommended for women who have family members  with BRCA-related cancers. BRCA-related cancers include: ? Breast. ? Ovarian. ? Tubal. ? Peritoneal cancers.  Results of the assessment will determine the need for genetic counseling and BRCA1 and BRCA2 testing.  Cervical Cancer Your health care provider may recommend that you be screened regularly for cancer of the pelvic organs (ovaries, uterus, and vagina). This screening involves a pelvic examination, including checking for microscopic changes to the surface of your cervix (Pap test). You may be encouraged to have this screening done every 3 years, beginning at age 65.  For women ages 58-65, health care providers may recommend pelvic exams and Pap testing every 3 years, or they may recommend the Pap and pelvic exam, combined with testing for human papilloma virus (HPV), every 5 years. Some types of HPV increase your risk of cervical cancer. Testing for HPV may also be done on women of any age with unclear Pap test results.  Other health  care providers may not recommend any screening for nonpregnant women who are considered low risk for pelvic cancer and who do not have symptoms. Ask your health care provider if a screening pelvic exam is right for you.  If you have had past treatment for cervical cancer or a condition that could lead to cancer, you need Pap tests and screening for cancer for at least 20 years after your treatment. If Pap tests have been discontinued, your risk factors (such as having a new sexual partner) need to be reassessed to determine if screening should resume. Some women have medical problems that increase the chance of getting cervical cancer. In these cases, your health care provider may recommend more frequent screening and Pap tests.  Colorectal Cancer  This type of cancer can be detected and often prevented.  Routine colorectal cancer screening usually begins at 67 years of age and continues through 67 years of age.  Your health care provider may recommend screening at an earlier age if you have risk factors for colon cancer.  Your health care provider may also recommend using home test kits to check for hidden blood in the stool.  A small camera at the end of a tube can be used to examine your colon directly (sigmoidoscopy or colonoscopy). This is done to check for the earliest forms of colorectal cancer.  Routine screening usually begins at age 29.  Direct examination of the colon should be repeated every 5-10 years through 67 years of age. However, you may need to be screened more often if early forms of precancerous polyps or small growths are found.  Skin Cancer  Check your skin from head to toe regularly.  Tell your health care provider about any new moles or changes in moles, especially if there is a change in a mole's shape or color.  Also tell your health care provider if you have a mole that is larger than the size of a pencil eraser.  Always use sunscreen. Apply sunscreen liberally  and repeatedly throughout the day.  Protect yourself by wearing long sleeves, pants, a wide-brimmed hat, and sunglasses whenever you are outside.  Heart disease, diabetes, and high blood pressure  High blood pressure causes heart disease and increases the risk of stroke. High blood pressure is more likely to develop in: ? People who have blood pressure in the high end of the normal range (130-139/85-89 mm Hg). ? People who are overweight or obese. ? People who are African American.  If you are 12-71 years of age, have your blood pressure checked  every 3-5 years. If you are 35 years of age or older, have your blood pressure checked every year. You should have your blood pressure measured twice-once when you are at a hospital or clinic, and once when you are not at a hospital or clinic. Record the average of the two measurements. To check your blood pressure when you are not at a hospital or clinic, you can use: ? An automated blood pressure machine at a pharmacy. ? A home blood pressure monitor.  If you are between 76 years and 9 years old, ask your health care provider if you should take aspirin to prevent strokes.  Have regular diabetes screenings. This involves taking a blood sample to check your fasting blood sugar level. ? If you are at a normal weight and have a low risk for diabetes, have this test once every three years after 66 years of age. ? If you are overweight and have a high risk for diabetes, consider being tested at a younger age or more often. Preventing infection Hepatitis B  If you have a higher risk for hepatitis B, you should be screened for this virus. You are considered at high risk for hepatitis B if: ? You were born in a country where hepatitis B is common. Ask your health care provider which countries are considered high risk. ? Your parents were born in a high-risk country, and you have not been immunized against hepatitis B (hepatitis B vaccine). ? You have HIV  or AIDS. ? You use needles to inject street drugs. ? You live with someone who has hepatitis B. ? You have had sex with someone who has hepatitis B. ? You get hemodialysis treatment. ? You take certain medicines for conditions, including cancer, organ transplantation, and autoimmune conditions.  Hepatitis C  Blood testing is recommended for: ? Everyone born from [birth year] through 1966. ? Anyone with known risk factors for hepatitis C.  Sexually transmitted infections (STIs)  You should be screened for sexually transmitted infections (STIs) including gonorrhea and chlamydia if: ? You are sexually active and are younger than 67 years of age. ? You are older than 67 years of age and your health care provider tells you that you are at risk for this type of infection. ? Your sexual activity has changed since you were last screened and you are at an increased risk for chlamydia or gonorrhea. Ask your health care provider if you are at risk.  If you do not have HIV, but are at risk, it may be recommended that you take a prescription medicine daily to prevent HIV infection. This is called pre-exposure prophylaxis (PrEP). You are considered at risk if: ? You are sexually active and do not regularly use condoms or know the HIV status of your partner(s). ? You take drugs by injection. ? You are sexually active with a partner who has HIV.  Talk with your health care provider about whether you are at high risk of being infected with HIV. If you choose to begin PrEP, you should first be tested for HIV. You should then be tested every 3 months for as long as you are taking PrEP. Pregnancy  If you are premenopausal and you may become pregnant, ask your health care provider about preconception counseling.  If you may become pregnant, take 400 to 800 micrograms (mcg) of folic acid every day.  If you want to prevent pregnancy, talk to your health care provider about birth control  (contraception). Osteoporosis and menopause  Osteoporosis is a disease in which the bones lose minerals and strength with aging. This can result in serious bone fractures. Your risk for osteoporosis can be identified using a bone density scan.  If you are 14 years of age or older, or if you are at risk for osteoporosis and fractures, ask your health care provider if you should be screened.  Ask your health care provider whether you should take a calcium or vitamin D supplement to lower your risk for osteoporosis.  Menopause may have certain physical symptoms and risks.  Hormone replacement therapy may reduce some of these symptoms and risks. Talk to your health care provider about whether hormone replacement therapy is right for you. Follow these instructions at home:  Schedule regular health, dental, and eye exams.  Stay current with your immunizations.  Do not use any tobacco products including cigarettes, chewing tobacco, or electronic cigarettes.  If you are pregnant, do not drink alcohol.  If you are breastfeeding, limit how much and how often you drink alcohol.  Limit alcohol intake to no more than 1 drink per day for nonpregnant women. One drink equals 12 ounces of beer, 5 ounces of wine, or 1 ounces of hard liquor.  Do not use street drugs.  Do not share needles.  Ask your health care provider for help if you need support or information about quitting drugs.  Tell your health care provider if you often feel depressed.  Tell your health care provider if you have ever been abused or do not feel safe at home. This information is not intended to replace advice given to you by your health care provider. Make sure you discuss any questions you have with your health care provider. Document Released: 10/26/2010 Document Revised: 09/18/2015 Document Reviewed: 01/14/2015 Elsevier Interactive Patient Education  2018 [place] you for enrolling in [place]. Please  follow the instructions below to securely access your online medical record. MyChart allows you to send messages to your doctor, view your test results, manage appointments, and more.   How Do I Sign Up? 1. In your Internet browser, go to [employer] and enter [email or web address]. 2. Click on the Sign Up Now link in the Sign In box. You will see the New Member Sign Up page. 3. Enter your MyChart Access Code exactly as it appears below. You will not need to use this code after you've completed the sign-up process. If you do not sign up before the expiration date, you must request a new code.  MyChart Access Code: [email or web address] Expires: 11/06/2016  9:56 AM  4. Enter your Social Security Number ([email or web address]) and Date of Birth (mm/dd/yyyy) as indicated and click Submit. You will be taken to the next sign-up page. 5. Create a MyChart ID. This will be your MyChart login ID and cannot be changed, so think of one that is secure and easy to remember. 6. Create a MyChart password. You can change your password at any time. 7. Enter your Password Reset Question and Answer. This can be used at a later time if you forget your password.  8. Enter your e-mail address. You will receive e-mail notification when new information is available in [place]. 9. Click Sign Up. You can now view your medical record.   Additional Information Remember, MyChart is NOT to be used for urgent needs. For medical emergencies, dial 911.

## 2016-10-01 LAB — MICROALBUMIN / CREATININE URINE RATIO
Creatinine, Urine: 158.6 mg/dL
Microalb/Creat Ratio: 9.6 mg/g creat (ref 0.0–30.0)
Microalbumin, Urine: 15.3 ug/mL

## 2016-10-01 NOTE — Progress Notes (Signed)
Subjective:   Allison Rollins is a 66 y.o. female who presents for an Initial Medicare Annual Wellness Visit.She reports she has been working for 9 years at [employer] in [employer]. She works from [employer]. She lost her husband a couple of years ago and was tearful at times. She lives alone and has 2 [place] and [place]. She has 5 grandchildren and they often stay with her. She drives and is active in the [employer] in [place]. Her hobbies consist of watching her Grandchildren play sports. She has one outside cat. She reports that she went to urgent care 2 weeks ago and was dx with vertigo. She is doing better and the passing out spells have decreased. She scored a 8 on her PHQ 9 with several days feeling down and not wanting to talk to anyone. She has no plan to hurt or harm herself. We made an appointment with Dr. Evette Doffing on 10/06/16 to discuss how she is feeling.We discussed her diet and she follows a low sodium carb diet. She tries to eat plenty of vegetables.      Objective:    Today's Vitals   09/30/16 1423  BP: 135/81  Pulse: (!) 56  Resp: 18  Weight: 238 lb 9.6 oz (108.2 kg)  Height: _0  (1.626 m)   Body mass index is 40.96 kg/m.   Current Medications (verified) Outpatient Encounter Prescriptions as of 09/30/2016  Medication Sig  . acetaminophen (TYLENOL) 500 MG tablet Take 500 mg by mouth every 6 (six) hours as needed for moderate pain.   Marland Kitchen albuterol (PROVENTIL HFA;VENTOLIN HFA) 108 (90 BASE) MCG/ACT inhaler Inhale 1-2 puffs into the lungs every 6 (six) hours as needed for wheezing.  Marland Kitchen aspirin EC 81 MG tablet Take 1 tablet (81 mg total) by mouth daily.  . citalopram (CELEXA) 20 MG tablet TAKE TWO TABLETS BY MOUTH ONCE DAILY  . ergocalciferol (VITAMIN D2) 50000 units capsule Take 1 capsule (50,000 Units total) by mouth every Thursday.  . furosemide (LASIX) 40 MG tablet Take 1 tablet (40 mg total) by mouth 2 (two) times daily as needed (for swelling/fluid).  .  gabapentin (NEURONTIN) 300 MG capsule Take 1-2 capsules (300-600 mg total) by mouth 2 (two) times daily. Take 37m in the morning, 6039mat night  . lisinopril (PRINIVIL,ZESTRIL) 40 MG tablet TAKE ONE TABLET BY MOUTH ONCE DAILY  . metFORMIN (GLUCOPHAGE) 500 MG tablet TAKE ONE TABLET BY MOUTH ONCE DAILY WITH BREAKFAST  . nitroGLYCERIN (NITROSTAT) 0.4 MG SL tablet Place 1 tablet (0.4 mg total) under the tongue every 5 (five) minutes as needed for chest pain.  . Omega-3 Fatty Acids (FISH OIL PO) Take 6 capsules by mouth daily.   . pantoprazole (PROTONIX) 40 MG tablet TAKE ONE TABLET BY MOUTH ONCE DAILY  . pravastatin (PRAVACHOL) 40 MG tablet Take 1 tablet (40 mg total) by mouth at bedtime.  . blood glucose meter kit and supplies Accu-check Nano smartview meter (Patient not taking: Reported on 09/30/2016)   No facility-administered encounter medications on file as of 09/30/2016.     Allergies (verified) Penicillins and Sulfa antibiotics   History: Past Medical History:  Diagnosis Date  . Anxiety   . Asthma   . CHF (congestive heart failure) ([facility]  . [facility] ([facility] disease) ([facility]  . Depression   . Diabetes mellitus without complication ([facility]  . HTN (hypertension)   . Hypoglycemia   . Kidney stones   . Parkinson's disease   .  Pneumonia   . S/P colonoscopy 10/15/04   normal   Past Surgical History:  Procedure Laterality Date  . CHOLECYSTECTOMY    . COLONOSCOPY  10/15/2004   Normal rectum/Normal colon  . COLONOSCOPY N/A 05/10/2016   Procedure: COLONOSCOPY;  Surgeon: Danie Binder, MD;  Location: [facility];  Service: Endoscopy;  Laterality: N/A;  10:30  . ESOPHAGOGASTRODUODENOSCOPY  09/11/2010   Dysphagia likely multifactorial (possible Candida esophagitis, likely nonspecific esophageal motility disorder, and/or uncontrolled gastroesophageal reflux disease), status post empiric dilation  . KIDNEY STONE SURGERY     Family History  Problem Relation Age of  Onset  . Coronary artery disease Father   . COPD Mother   . Asthma Brother   . Coronary artery disease Sister   . Diabetes Sister   . Coronary artery disease Brother   . Coronary artery disease Sister    Social History   Occupational History  . disabled    Social History Main Topics  . Smoking status: Former Smoker    Packs/day: 0.25    Years: 30.00    Types: Cigarettes    Start date: 01/04/1965    Quit date: 01/03/1995  . Smokeless tobacco: Never Used  . Alcohol use No  . Drug use: No  . Sexual activity: No  Lives alone. Husband passed away a couple of years ago.   Tobacco Counseling Counseling given: Not Answered   Activities of Daily Living In your present state of health, do you have any difficulty performing the following activities: 09/30/2016 12/22/2015  Hearing? N N  Vision? N N  Difficulty concentrating or making decisions? N N  Walking or climbing stairs? N N  Dressing or bathing? N N  Doing errands, shopping? N N  Some recent data might be hidden  No problems noted with ADL's  Immunizations and Health Maintenance Immunization History  Administered Date(s) Administered  . Influenza, High Dose Seasonal PF 02/02/2015, 02/09/2016  . Pneumococcal Conjugate-13 01/05/2015  . Pneumococcal Polysaccharide-23 02/09/2016  . Zoster 01/05/2015   Health Maintenance Due  Topic Date Due  . OPHTHALMOLOGY EXAM  01/05/1960  . TETANUS/TDAP  01/04/1969  . DEXA SCAN  01/05/2015    Patient Care Team: Eustaquio Maize, MD as PCP - General (Pediatrics) Danie Binder, MD (Gastroenterology) Phillips Odor, MD as Consulting Physician (Neurology) Harl Bowie Alphonse Guild, MD as Consulting Physician (Cardiology)  Indicate any recent Medical Services you may have received from other than [facility] providers in the past year (date may be approximate).     Assessment:   This is a routine wellness examination for Toyna. Follow up appointment with Dr. Evette Doffing on 10/06/16 @  2:30p  Hearing/Vision screen No problems with hearing but she has not had an eye exam in a long time. She will follow up with an appointment at [facility] when she gets paid.   Dietary issues and exercise activities discussed: Low sodium and low carb with plenty of vegetables.     Goals    None     Depression Screen PHQ 2/9 Scores 09/30/2016 08/02/2016 06/28/2016 03/29/2016 12/25/2015 12/22/2015 11/13/2015  PHQ - 2 Score 2 2 0 4 5 - 5  PHQ- 9 Score 8 7 - 14 19 - 20  Exception Documentation - - - - - Patient refusal -  Not completed - - - - - pt just started on citalopram not long ago -    Fall Risk Fall Risk  09/30/2016 08/02/2016 06/28/2016 03/29/2016 12/25/2015  Falls in the  past year? Yes No No No No  Number falls in past yr: 2 or more - - - -  Injury with Fall? No - - - -  Risk for fall due to : History of fall(s) - - - -  Follow up Education provided;Falls prevention discussed - - - -    Cognitive Function: MMSE - Mini Mental State Exam 09/30/2016  Orientation to time 4  Orientation to Place 4  Registration 3  Attention/ Calculation 5  Language- name 2 objects 2  Language- repeat 1  Language- follow 3 step command 3  Language- read & follow direction 1  Write a sentence 1  Copy design 1        Screening Tests Health Maintenance  Topic Date Due  . OPHTHALMOLOGY EXAM  01/05/1960  . TETANUS/TDAP  01/04/1969  . DEXA SCAN  01/05/2015  . INFLUENZA VACCINE  11/24/2016  . HEMOGLOBIN A1C  12/29/2016  . FOOT EXAM  03/29/2017  . MAMMOGRAM  12/16/2017  . COLONOSCOPY  05/10/2026  . Hepatitis C Screening  Completed  . PNA vac Low Risk Adult  Completed      Plan:  She needs a diabetic eye exam. She has not had an eye exam in several years. I called [facility] in [place] and with her insurance that exam will cost $45. She will wait till she gets her check and call for an exam. She also needs a Tdap but the cost to her today was $65.00 and she said she would check on it the next  time she is in for a visit. We discussed the  shringrix vaccine but it is on manufacture back order and we are out at this time. Urine Microalbumin ordered because she has not had one in a year.   I have personally reviewed and noted the following in the patient's chart:   . Medical and social history . Use of alcohol, tobacco or illicit drugs  . Current medications and supplements . Functional ability and status . Nutritional status . Physical activity . Advanced directives . List of other physicians . Hospitalizations, surgeries, and ER visits in previous 12 months . Vitals . Screenings to include cognitive, depression, and falls . Referrals and appointments  In addition, I have reviewed and discussed with patient certain preventive protocols, quality metrics, and best practice recommendations. A written personalized care plan for preventive services as well as general preventive health recommendations were provided to patient.     Torrie Mayers, RN   10/01/2016   I have reviewed and agree with the above AWV documentation.   Assunta Found, MD [place]

## 2016-10-06 ENCOUNTER — Encounter: Payer: Self-pay | Admitting: Pediatrics

## 2016-10-06 ENCOUNTER — Ambulatory Visit (INDEPENDENT_AMBULATORY_CARE_PROVIDER_SITE_OTHER): Payer: Medicare HMO | Admitting: Pediatrics

## 2016-10-06 VITALS — BP 94/66 | HR 65 | Temp 98.6°F | Ht 64.0 in | Wt 233.6 lb

## 2016-10-06 DIAGNOSIS — I10 Essential (primary) hypertension: Secondary | ICD-10-CM | POA: Diagnosis not present

## 2016-10-06 DIAGNOSIS — G629 Polyneuropathy, unspecified: Secondary | ICD-10-CM | POA: Diagnosis not present

## 2016-10-06 DIAGNOSIS — E1142 Type 2 diabetes mellitus with diabetic polyneuropathy: Secondary | ICD-10-CM | POA: Diagnosis not present

## 2016-10-06 DIAGNOSIS — F329 Major depressive disorder, single episode, unspecified: Secondary | ICD-10-CM | POA: Diagnosis not present

## 2016-10-06 DIAGNOSIS — E785 Hyperlipidemia, unspecified: Secondary | ICD-10-CM

## 2016-10-06 DIAGNOSIS — F32A Depression, unspecified: Secondary | ICD-10-CM

## 2016-10-06 MED ORDER — LISINOPRIL 20 MG PO TABS: 20.0000 mg | ORAL_TABLET | Freq: Every day | ORAL | 1 refills | Status: DC

## 2016-10-06 MED ORDER — PRAVASTATIN SODIUM 40 MG PO TABS: 40.0000 mg | ORAL_TABLET | Freq: Every day | ORAL | 3 refills | Status: DC

## 2016-10-06 MED ORDER — GABAPENTIN 300 MG PO CAPS: 300.0000 mg | ORAL_CAPSULE | Freq: Two times a day (BID) | ORAL | 1 refills | Status: DC

## 2016-10-06 MED ORDER — SERTRALINE HCL 50 MG PO TABS: 50.0000 mg | ORAL_TABLET | Freq: Every day | ORAL | 3 refills | Status: DC

## 2016-10-06 NOTE — Progress Notes (Signed)
Subjective:   Patient ID: Allison Rollins, female    DOB: 18-Apr-1951, 66 y.o.   MRN: [id number] CC: Follow-up (3 month) multiple med problems HPI: Allison Rollins is a 65 y.o. female presenting for Follow-up (3 month)  Says she hates being by herself all the time Not sleeping well at night Feeling lonely much of time since husband passed away No thoughts of self harm Doesn't think celexa is helping  Seen at urgent care for vertigo a week ago, lasted about a week, hasnt had any past few days Mostly had symptoms when laying down to get to bed Would get worse with head turning  Legs feeling better with gabapentin  HLD: taking med daily, no s/e  HTN: no CP, no SOB Taking lisinopril daily   Depression screen [facility] 2/9 10/06/2016 09/30/2016 08/02/2016 06/28/2016 03/29/2016  Decreased Interest 1 1 1  0 1  Down, Depressed, Hopeless 1 1 1  0 3  PHQ - 2 Score 2 2 2  0 4  Altered sleeping 1 1 1  - 1  Tired, decreased energy 1 1 2  - 3  Change in appetite 1 1 1  - 3  Feeling bad or failure about yourself  1 1 0 - 2  Trouble concentrating 1 1 0 - 0  Moving slowly or fidgety/restless 1 1 1  - 1  Suicidal thoughts 0 0 0 - 0  PHQ-9 Score 8 8 7  - 14  Difficult doing work/chores Somewhat difficult Not difficult at all - - Somewhat difficult     Relevant past medical, surgical, family and social history reviewed. Allergies and medications reviewed and updated. History  Smoking Status  . Former Smoker  . Packs/day: 0.25  . Years: 30.00  . Types: Cigarettes  . Start date: 01/04/1965  . Quit date: 01/03/1995  Smokeless Tobacco  . Never Used   ROS: Per HPI   Objective:    BP 94/66 Comment: 94 66  Pulse 65   Temp 98.6 F (37 C) (Oral)   Ht 5\' 4"  (1.626 m)   Wt 233 lb 9.6 oz (106 kg)   BMI 40.10 kg/m   Wt Readings from Last 3 Encounters:  10/06/16 233 lb 9.6 oz (106 kg)  09/30/16 238 lb 9.6 oz (108.2 kg)  09/07/16 237 lb (107.5 kg)    Gen: NAD, alert, cooperative with exam, NCAT EYES: EOMI, no  conjunctival injection, or no icterus ENT:   OP without erythema CV: NRRR, normal S1/S2 Resp: CTABL, no wheezes, normal WOB Abd: +BS, soft, NTND. Ext: trace edema b/l LE, warm Neuro: Alert and oriented, strength equal b/l UE and LE, coordination grossly normal MSK: normal muscle bulk Psych: slightly flat, well groomed  Assessment & Plan:  Allison Rollins was seen today for follow-up.  Diagnoses and all orders for this visit:  Type 2 diabetes mellitus with diabetic polyneuropathy, without long-term current use of insulin (HCC) Well controlled, on metformin, last A1c 5.9  Depression, unspecified depression type Ongoing symptoms on celexa, stop celexa, start below Referral to [facility] -     sertraline (ZOLOFT) 50 MG tablet; Take 1 tablet (50 mg total) by mouth daily.  Neuropathy Improved with below, continue -     gabapentin (NEURONTIN) 300 MG capsule; Take 1-2 capsules (300-600 mg total) by mouth 2 (two) times daily. Take 300mg  in the morning, 600mg  at night  Hyperlipidemia, unspecified hyperlipidemia type Stable, cont statin -     pravastatin (PRAVACHOL) 40 MG tablet; Take 1 tablet (40 mg total) by mouth at bedtime.  Benign hypertension BP on low side today, no side effects now, had episode of dizziness about a week ago, will decrease from 40 to 20mg  Check at home, let me know if elevated -     lisinopril (PRINIVIL,ZESTRIL) 20 MG tablet; Take 1 tablet (20 mg total) by mouth daily.   Follow up plan: Return in about 4 weeks (around 11/03/2016). Allison Krasarol Alayzha An, MD [facility]

## 2016-10-06 NOTE — Patient Instructions (Signed)
Throw away lisinopril 40mg  that you have at home  I am sending in lisinopril 20mg 

## 2016-10-25 ENCOUNTER — Telehealth: Payer: Self-pay | Admitting: Pediatrics

## 2016-10-25 NOTE — Telephone Encounter (Signed)
Apt made Thursday with Oswaldo DoneVincent.

## 2016-10-25 NOTE — Telephone Encounter (Signed)
Patient called states that one leg is swelling and the other leg is red.They are not warm or hot to the touch. She takes one fluid in am and one in the pm. She wants to know what she needs to do if cut back on fluid pill or come in to be seen. Please call at home number.

## 2016-10-25 NOTE — Telephone Encounter (Signed)
Needs to be seen

## 2016-10-26 ENCOUNTER — Other Ambulatory Visit: Payer: Self-pay | Admitting: Pediatrics

## 2016-10-28 ENCOUNTER — Ambulatory Visit: Payer: Self-pay | Admitting: Pediatrics

## 2016-11-03 ENCOUNTER — Ambulatory Visit: Payer: Medicare HMO | Admitting: Pediatrics

## 2016-11-03 ENCOUNTER — Ambulatory Visit: Payer: Medicare HMO | Attending: Neurology | Admitting: Neurology

## 2016-11-03 DIAGNOSIS — I519 Heart disease, unspecified: Secondary | ICD-10-CM | POA: Diagnosis not present

## 2016-11-03 DIAGNOSIS — G4733 Obstructive sleep apnea (adult) (pediatric): Secondary | ICD-10-CM | POA: Diagnosis not present

## 2016-11-03 DIAGNOSIS — G4709 Other insomnia: Secondary | ICD-10-CM | POA: Diagnosis not present

## 2016-11-03 DIAGNOSIS — Z7982 Long term (current) use of aspirin: Secondary | ICD-10-CM | POA: Insufficient documentation

## 2016-11-03 DIAGNOSIS — G2581 Restless legs syndrome: Secondary | ICD-10-CM | POA: Diagnosis not present

## 2016-11-03 DIAGNOSIS — R0683 Snoring: Secondary | ICD-10-CM | POA: Diagnosis not present

## 2016-11-03 DIAGNOSIS — E6609 Other obesity due to excess calories: Secondary | ICD-10-CM | POA: Diagnosis not present

## 2016-11-03 DIAGNOSIS — Z79899 Other long term (current) drug therapy: Secondary | ICD-10-CM | POA: Diagnosis not present

## 2016-11-03 DIAGNOSIS — E785 Hyperlipidemia, unspecified: Secondary | ICD-10-CM | POA: Diagnosis not present

## 2016-11-03 DIAGNOSIS — Z7984 Long term (current) use of oral hypoglycemic drugs: Secondary | ICD-10-CM | POA: Insufficient documentation

## 2016-11-03 DIAGNOSIS — R251 Tremor, unspecified: Secondary | ICD-10-CM | POA: Diagnosis not present

## 2016-11-03 DIAGNOSIS — I10 Essential (primary) hypertension: Secondary | ICD-10-CM | POA: Diagnosis not present

## 2016-11-03 DIAGNOSIS — R2689 Other abnormalities of gait and mobility: Secondary | ICD-10-CM | POA: Diagnosis not present

## 2016-11-04 ENCOUNTER — Encounter: Payer: Self-pay | Admitting: Pediatrics

## 2016-11-04 ENCOUNTER — Telehealth: Payer: Self-pay | Admitting: Pediatrics

## 2016-11-13 NOTE — Procedures (Signed)
Frazer A. Merlene Laughter, MD     [email or web address]             NOCTURNAL POLYSOMNOGRAPHY   LOCATION: [facility]  Patient Name: Allison Rollins, Allison Rollins)  Allison Rollins Date: 11/03/2016 Gender: Female D.O.B: 04-01-1951 Age (years): 5 Referring Provider: Barton Fanny NP Height (inches): 66 Interpreting Physician: Phillips Odor MD, ABSM Weight (lbs): 233 RPSGT: Peak, Robert BMI: 38 MRN: [id number] Neck Size: 18.00 CLINICAL INFORMATION Sleep Study Type: NPSG  Indication for sleep study: N/A  Epworth Sleepiness Score: 9  SLEEP STUDY TECHNIQUE As per the AASM Manual for the Scoring of Sleep and Associated Events v2.3 (April 2016) with a hypopnea requiring 4% desaturations.  The channels recorded and monitored were frontal, central and occipital EEG, electrooculogram (EOG), submentalis EMG (chin), nasal and oral airflow, thoracic and abdominal wall motion, anterior tibialis EMG, snore microphone, electrocardiogram, and pulse oximetry.  MEDICATIONS Medications self-administered by patient taken the night of the study : N/A  Current Outpatient Prescriptions:  .  acetaminophen (TYLENOL) 500 MG tablet, Take 500 mg by mouth every 6 (six) hours as needed for moderate pain. , Disp: , Rfl:  .  albuterol (PROVENTIL HFA;VENTOLIN HFA) 108 (90 BASE) MCG/ACT inhaler, Inhale 1-2 puffs into the lungs every 6 (six) hours as needed for wheezing., Disp: 1 Inhaler, Rfl: 0 .  aspirin EC 81 MG tablet, Take 1 tablet (81 mg total) by mouth daily., Disp: , Rfl:  .  blood glucose meter kit and supplies, Accu-check Nano smartview meter, Disp: 1 each, Rfl: 0 .  ergocalciferol (VITAMIN D2) 50000 units capsule, Take 1 capsule (50,000 Units total) by mouth every Thursday., Disp: 12 capsule, Rfl: 0 .  furosemide (LASIX) 40 MG tablet, Take 1 tablet (40 mg total) by mouth 2 (two) times daily as needed (for swelling/fluid)., Disp: 60 tablet, Rfl: 3 .  gabapentin (NEURONTIN) 300 MG capsule, Take  1-2 capsules (300-600 mg total) by mouth 2 (two) times daily. Take 365m in the morning, 603mat night, Disp: 270 capsule, Rfl: 1 .  lisinopril (PRINIVIL,ZESTRIL) 20 MG tablet, Take 1 tablet (20 mg total) by mouth daily., Disp: 90 tablet, Rfl: 1 .  metFORMIN (GLUCOPHAGE) 500 MG tablet, TAKE ONE TABLET BY MOUTH ONCE DAILY WITH BREAKFAST, Disp: 30 tablet, Rfl: 3 .  nitroGLYCERIN (NITROSTAT) 0.4 MG SL tablet, Place 1 tablet (0.4 mg total) under the tongue every 5 (five) minutes as needed for chest pain., Disp: 20 tablet, Rfl: 3 .  Omega-3 Fatty Acids (FISH OIL PO), Take 6 capsules by mouth daily. , Disp: , Rfl:  .  pantoprazole (PROTONIX) 40 MG tablet, TAKE ONE TABLET BY MOUTH ONCE DAILY, Disp: 30 tablet, Rfl: 5 .  pravastatin (PRAVACHOL) 40 MG tablet, Take 1 tablet (40 mg total) by mouth at bedtime., Disp: 90 tablet, Rfl: 3 .  sertraline (ZOLOFT) 50 MG tablet, Take 1 tablet (50 mg total) by mouth daily., Disp: 30 tablet, Rfl: 3   SLEEP ARCHITECTURE The study was initiated at 9:31:44 PM and ended at 5:12:32 AM.  Sleep onset time was 70.8 minutes and the sleep efficiency was 69.1%. The total sleep time was 318.5 minutes.  Stage REM latency was 89.0 minutes.  The patient spent 5.02% of the night in stage N1 sleep, 64.84% in stage N2 sleep, 10.99% in stage N3 and 19.15% in REM.  Alpha intrusion was absent.  Supine sleep was 0.00%.  RESPIRATORY PARAMETERS The overall apnea/hypopnea index (AHI) was 8.7 per hour. There were 1 total apneas, including 1  obstructive, 0 central and 0 mixed apneas. There were 45 hypopneas and 20 RERAs.  The AHI during Stage REM sleep was 42.3 per hour.  AHI while supine was N/A per hour.  The mean oxygen saturation was 93.21%. The minimum SpO2 during sleep was 84.00%.  Soft snoring was noted during this study.  CARDIAC DATA The 2 lead EKG demonstrated sinus rhythm. The mean heart rate was 49.38 beats per minute. Other EKG findings include: None. LEG MOVEMENT  DATA The total PLMS were 0 with a resulting PLMS index of 0.00. Associated arousal with leg movement index was 0.0.  IMPRESSIONS - Mild obstructive sleep apnea not requiring positive pressure treatment is observed.    Delano Metz, MD Diplomate, American Board of Sleep Medicine.   ELECTRONICALLY SIGNED ON:  11/13/2016, 12:03 PM [place] PH: [phone]   FX: [phone] [place]

## 2016-11-18 ENCOUNTER — Ambulatory Visit (INDEPENDENT_AMBULATORY_CARE_PROVIDER_SITE_OTHER): Payer: Medicare HMO | Admitting: Pediatrics

## 2016-11-18 ENCOUNTER — Encounter: Payer: Self-pay | Admitting: Pediatrics

## 2016-11-18 VITALS — BP 96/61 | HR 69 | Temp 97.9°F | Ht 66.0 in | Wt 239.0 lb

## 2016-11-18 DIAGNOSIS — R42 Dizziness and giddiness: Secondary | ICD-10-CM | POA: Diagnosis not present

## 2016-11-18 DIAGNOSIS — I10 Essential (primary) hypertension: Secondary | ICD-10-CM | POA: Diagnosis not present

## 2016-11-18 DIAGNOSIS — F32A Depression, unspecified: Secondary | ICD-10-CM

## 2016-11-18 DIAGNOSIS — F329 Major depressive disorder, single episode, unspecified: Secondary | ICD-10-CM

## 2016-11-18 MED ORDER — LISINOPRIL 10 MG PO TABS: 10.0000 mg | ORAL_TABLET | Freq: Every day | ORAL | 1 refills | Status: DC

## 2016-11-18 NOTE — Progress Notes (Signed)
  Subjective:   Patient ID: Allison Rollins, female    DOB: June 19, 1950, 66 y.o.   MRN: [id number] CC: Follow-up (4 week) depression HPI: Allison Rollins is a 66 y.o. female presenting for Follow-up (4 week)  Depression: getting calls through [place] Has been pleased that  Not falling asleep during the day Taking 61m daily, feeling a lot better Says when she does get down she calls her grandkids and they help her feel better  Had episode of dizziness this afternoon Last for a few minutes The room felt like it was spinning Feeling fine now Has had a few other episodes Doesn't have BP cuff at home  No swelling or fevers No HA or CP  Relevant past medical, surgical, family and social history reviewed. Allergies and medications reviewed and updated. History  Smoking Status  . Former Smoker  . Packs/day: 0.25  . Years: 30.00  . Types: Cigarettes  . Start date: 01/04/1965  . Quit date: 01/03/1995  Smokeless Tobacco  . Never Used   ROS: Per HPI   Objective:    BP 96/61   Pulse 69   Temp 97.9 F (36.6 C) (Oral)   Ht _0  (1.676 m)   Wt 239 lb (108.4 kg)   BMI 38.58 kg/m   Wt Readings from Last 3 Encounters:  11/18/16 239 lb (108.4 kg)  11/04/16 234 lb (106.1 kg)  10/06/16 233 lb 9.6 oz (106 kg)    Gen: NAD, alert, cooperative with exam, NCAT EYES: EOMI, no conjunctival injection, or no icterus LYMPH: no cervical LAD CV: NRRR, normal S1/S2, no murmur, distal pulses 2+ b/l Resp: CTABL, no wheezes, normal WOB Abd: +BS, soft, NTND.  Ext: No edema, warm Neuro: Alert and oriented, strength equal b/l UE and LE, coordination grossly normal MSK: normal muscle bulk  Assessment & Plan:  GCREOLAwas seen today for follow-up multiple med problems  Diagnoses and all orders for this visit:  Depression, unspecified depression type Much improved on sertraline, cont  Benign hypertension BP low today Decrease lisinopril to 163mCheck labs -     lisinopril (PRINIVIL,ZESTRIL) 10 MG  tablet; Take 1 tablet (10 mg total) by mouth daily. -     BMP8+EGFR  Dizziness Likely due to low BP, let me know if continues with decrease BP med dose  Follow up plan: No Follow-up on file. CaAssunta FoundMD [place] [place]

## 2016-11-19 DIAGNOSIS — M47816 Spondylosis without myelopathy or radiculopathy, lumbar region: Secondary | ICD-10-CM | POA: Diagnosis not present

## 2016-11-19 LAB — BMP8+EGFR
BUN/Creatinine Ratio: 11 — ABNORMAL LOW (ref 12–28)
BUN: 11 mg/dL (ref 8–27)
CO2: 27 mmol/L (ref 20–29)
Calcium: 9.6 mg/dL (ref 8.7–10.3)
Chloride: 102 mmol/L (ref 96–106)
Creatinine, Ser: 0.98 mg/dL (ref 0.57–1.00)
GFR calc Af Amer: 70 mL/min/{1.73_m2} (ref >59)
GFR calc non Af Amer: 60 mL/min/{1.73_m2} (ref >59)
Glucose: 112 mg/dL — ABNORMAL HIGH (ref 65–99)
Potassium: 4.8 mmol/L (ref 3.5–5.2)
Sodium: 143 mmol/L (ref 134–144)

## 2016-11-23 ENCOUNTER — Telehealth: Payer: Self-pay | Admitting: Pediatrics

## 2016-11-23 NOTE — Telephone Encounter (Signed)
Pt aware kidney functions normal

## 2016-11-25 DIAGNOSIS — R251 Tremor, unspecified: Secondary | ICD-10-CM | POA: Diagnosis not present

## 2016-11-25 DIAGNOSIS — G4733 Obstructive sleep apnea (adult) (pediatric): Secondary | ICD-10-CM | POA: Diagnosis not present

## 2016-11-25 DIAGNOSIS — I10 Essential (primary) hypertension: Secondary | ICD-10-CM | POA: Diagnosis not present

## 2016-11-25 DIAGNOSIS — I519 Heart disease, unspecified: Secondary | ICD-10-CM | POA: Diagnosis not present

## 2016-11-25 DIAGNOSIS — G2581 Restless legs syndrome: Secondary | ICD-10-CM | POA: Diagnosis not present

## 2016-11-25 DIAGNOSIS — E6609 Other obesity due to excess calories: Secondary | ICD-10-CM | POA: Diagnosis not present

## 2016-11-25 DIAGNOSIS — E785 Hyperlipidemia, unspecified: Secondary | ICD-10-CM | POA: Diagnosis not present

## 2016-11-25 DIAGNOSIS — R2689 Other abnormalities of gait and mobility: Secondary | ICD-10-CM | POA: Diagnosis not present

## 2016-11-25 DIAGNOSIS — G4709 Other insomnia: Secondary | ICD-10-CM | POA: Diagnosis not present

## 2016-12-13 ENCOUNTER — Encounter: Payer: Self-pay | Admitting: Nurse Practitioner

## 2016-12-13 ENCOUNTER — Ambulatory Visit (INDEPENDENT_AMBULATORY_CARE_PROVIDER_SITE_OTHER): Payer: Medicare HMO | Admitting: Nurse Practitioner

## 2016-12-13 VITALS — BP 125/80 | HR 58 | Temp 97.0°F | Ht 66.0 in | Wt 236.0 lb

## 2016-12-13 DIAGNOSIS — F4320 Adjustment disorder, unspecified: Secondary | ICD-10-CM

## 2016-12-13 DIAGNOSIS — I10 Essential (primary) hypertension: Secondary | ICD-10-CM

## 2016-12-13 DIAGNOSIS — F4321 Adjustment disorder with depressed mood: Secondary | ICD-10-CM

## 2016-12-13 NOTE — Progress Notes (Signed)
   Subjective:    Patient ID: Allison Rollins, female    DOB: 06-14-1950, 66 y.o.   MRN: [id number]  HPI  Patient comes in today stating her blood pressure has been going up and down. Her brother passed away a week ago and she is having a hard time with it and has had to be out of work due to her blood pressure. SHe is better now but needs work note.   Review of Systems  Constitutional: Negative.   Respiratory: Negative.   Cardiovascular: Negative.   Gastrointestinal: Negative.   Genitourinary: Negative.   Neurological: Negative for dizziness, weakness and headaches.  Psychiatric/Behavioral: Negative.   All other systems reviewed and are negative.      Objective:   Physical Exam  Constitutional: She appears well-developed and well-nourished. No distress.  Cardiovascular: Normal rate and regular rhythm.   Pulmonary/Chest: Effort normal and breath sounds normal.  Neurological: She is alert.  Skin: Skin is warm.  Psychiatric: She has a normal mood and affect. Her behavior is normal. Judgment and thought content normal.   BP 125/80   Pulse (!) 58   Temp (!) 97 F (36.1 C) (Oral)   Ht 5\' 6"  (1.676 m)   Wt 236 lb (107 kg)   BMI 38.09 kg/m       Assessment & Plan:   1. Benign hypertension   2. Grief    Watch salt in diet Continue current meds Follow up prn  Mary-Margaret Daphine Deutscher, FNP

## 2016-12-13 NOTE — Patient Instructions (Signed)

## 2016-12-20 ENCOUNTER — Telehealth: Payer: Self-pay | Admitting: Pediatrics

## 2016-12-20 ENCOUNTER — Other Ambulatory Visit: Payer: Self-pay | Admitting: *Deleted

## 2016-12-20 DIAGNOSIS — G629 Polyneuropathy, unspecified: Secondary | ICD-10-CM

## 2016-12-20 DIAGNOSIS — I10 Essential (primary) hypertension: Secondary | ICD-10-CM

## 2016-12-20 MED ORDER — GABAPENTIN 300 MG PO CAPS: 300.0000 mg | ORAL_CAPSULE | Freq: Two times a day (BID) | ORAL | 0 refills | Status: DC

## 2016-12-20 MED ORDER — LISINOPRIL 10 MG PO TABS: 10.0000 mg | ORAL_TABLET | Freq: Every day | ORAL | 0 refills | Status: DC

## 2016-12-20 MED ORDER — METFORMIN HCL 500 MG PO TABS: ORAL_TABLET | ORAL | 0 refills | Status: DC

## 2017-01-03 ENCOUNTER — Telehealth: Payer: Self-pay | Admitting: Pediatrics

## 2017-01-03 NOTE — Telephone Encounter (Signed)
Received

## 2017-01-05 DIAGNOSIS — G4709 Other insomnia: Secondary | ICD-10-CM | POA: Diagnosis not present

## 2017-01-05 DIAGNOSIS — E785 Hyperlipidemia, unspecified: Secondary | ICD-10-CM | POA: Diagnosis not present

## 2017-01-05 DIAGNOSIS — I10 Essential (primary) hypertension: Secondary | ICD-10-CM | POA: Diagnosis not present

## 2017-01-05 DIAGNOSIS — R2689 Other abnormalities of gait and mobility: Secondary | ICD-10-CM | POA: Diagnosis not present

## 2017-01-05 DIAGNOSIS — G2581 Restless legs syndrome: Secondary | ICD-10-CM | POA: Diagnosis not present

## 2017-01-05 DIAGNOSIS — I519 Heart disease, unspecified: Secondary | ICD-10-CM | POA: Diagnosis not present

## 2017-01-05 DIAGNOSIS — R251 Tremor, unspecified: Secondary | ICD-10-CM | POA: Diagnosis not present

## 2017-01-05 DIAGNOSIS — R739 Hyperglycemia, unspecified: Secondary | ICD-10-CM | POA: Diagnosis not present

## 2017-01-05 DIAGNOSIS — E6609 Other obesity due to excess calories: Secondary | ICD-10-CM | POA: Diagnosis not present

## 2017-01-07 ENCOUNTER — Other Ambulatory Visit: Payer: Self-pay | Admitting: Pediatrics

## 2017-01-07 DIAGNOSIS — E559 Vitamin D deficiency, unspecified: Secondary | ICD-10-CM

## 2017-01-07 NOTE — Telephone Encounter (Signed)
Do not see a Vit D in EPIC 

## 2017-01-10 ENCOUNTER — Ambulatory Visit (INDEPENDENT_AMBULATORY_CARE_PROVIDER_SITE_OTHER): Payer: Medicare HMO | Admitting: Family Medicine

## 2017-01-10 ENCOUNTER — Ambulatory Visit (INDEPENDENT_AMBULATORY_CARE_PROVIDER_SITE_OTHER): Payer: Medicare HMO

## 2017-01-10 ENCOUNTER — Encounter: Payer: Self-pay | Admitting: Family Medicine

## 2017-01-10 VITALS — BP 138/73 | HR 54 | Temp 97.3°F | Ht 66.0 in | Wt 241.4 lb

## 2017-01-10 DIAGNOSIS — M546 Pain in thoracic spine: Secondary | ICD-10-CM | POA: Diagnosis not present

## 2017-01-10 MED ORDER — CYCLOBENZAPRINE HCL 5 MG PO TABS: 5.0000 mg | ORAL_TABLET | Freq: Every day | ORAL | 0 refills | Status: DC

## 2017-01-10 NOTE — Patient Instructions (Signed)
Great to meet you!  Come back or call with any concerns  Try tylenol for pain, ice, and flexeril 1-2 pills at night.

## 2017-01-10 NOTE — Progress Notes (Signed)
   HPI  Patient presents today here with right-sided back pain.  Patient states that she's had right-sided mid back pain for quite some time, however the last week it's worse. She states it's worse with bending for an associated severe when she bends for that he nearly takes her breath away.  Tylenol helps. Patient has history of congestive heart failure, she avoids NSAIDs  She denies any overt injury, especially recent injury. When asked she mentions 2 or 3 episodes of straining, 2 of them dealing with moving her husband, and another dealing with moving a heavy object. Both of these are remote.  PMH: Smoking status noted ROS: Per HPI  Objective: BP 138/73   Pulse (!) 54   Temp (!) 97.3 F (36.3 C) (Oral)   Ht  (1.676 m)   Wt 241 lb 6.4 oz (109.5 kg)   BMI 38.96 kg/m  Gen: NAD, alert, cooperative with exam HEENT: NCAT CV: RRR, good S1/S2, no murmur Resp: CTABL, no wheezes, non-labored Ext: No edema, warm Neuro: Alert and oriented, No gross deficits MSK:  Tenderness to palpation over the right lateral thoracic paraspinal muscles over the lower ribs. No midline tenderness, no overt CVA tenderness.   Assessment and plan:  # Acute right-sided thoracic back pain Acute on chronic pain, no injury. Recommended continue Tylenol, ice as needed, will also add Flexeril. I discussed the increased risk of fall with Flexeril, however we will use it for a short time to see if we can help her with her symptoms. Plain film, pending Follow-up PCP as needed     Orders Placed This Encounter  Procedures  . DG Thoracic Spine 2 View    Standing Status:   Future    Number of Occurrences:   1    Standing Expiration Date:   03/12/2018    Order Specific Question:   Reason for Exam (SYMPTOM  OR DIAGNOSIS REQUIRED)    Answer:   R upper back pain    Order Specific Question:   Preferred imaging location?    Answer:   Internal    Meds ordered this encounter  Medications  .  cyclobenzaprine (FLEXERIL) 5 MG tablet    Sig: Take 1-2 tablets (5-10 mg total) by mouth at bedtime.    Dispense:  30 tablet    Refill:  0    Murtis Sink, MD [place] [facility] 01/10/2017, 2:36 PM

## 2017-01-11 ENCOUNTER — Telehealth: Payer: Self-pay

## 2017-01-11 NOTE — Telephone Encounter (Signed)
Insurance denied prior auth for Cyclobenzaprine  Must try and fail two of the following baclofen and tizanidine

## 2017-01-11 NOTE — Progress Notes (Signed)
Patient aware.

## 2017-01-13 ENCOUNTER — Telehealth: Payer: Self-pay | Admitting: Pediatrics

## 2017-01-13 MED ORDER — TIZANIDINE HCL 2 MG PO TABS: 2.0000 mg | ORAL_TABLET | Freq: Every evening | ORAL | 0 refills | Status: DC | PRN

## 2017-01-13 NOTE — Addendum Note (Signed)
Addended by: [name] on: 01/13/2017 08:12 AM   Modules accepted: Orders

## 2017-01-20 NOTE — Telephone Encounter (Signed)
Attempted call a week ago - note closed

## 2017-01-26 ENCOUNTER — Telehealth: Payer: Self-pay | Admitting: Pediatrics

## 2017-01-26 DIAGNOSIS — H5200 Hypermetropia, unspecified eye: Secondary | ICD-10-CM | POA: Diagnosis not present

## 2017-01-26 DIAGNOSIS — I10 Essential (primary) hypertension: Secondary | ICD-10-CM | POA: Diagnosis not present

## 2017-01-26 DIAGNOSIS — Z0100 Encounter for examination of eyes and vision without abnormal findings: Secondary | ICD-10-CM | POA: Diagnosis not present

## 2017-01-26 DIAGNOSIS — E109 Type 1 diabetes mellitus without complications: Secondary | ICD-10-CM | POA: Diagnosis not present

## 2017-01-26 DIAGNOSIS — H25813 Combined forms of age-related cataract, bilateral: Secondary | ICD-10-CM | POA: Diagnosis not present

## 2017-01-26 LAB — HM DIABETES EYE EXAM

## 2017-01-27 NOTE — Telephone Encounter (Signed)
Pt notified to try Tylenol or OTC patches for arthritis pain Pt verbalizes understanding

## 2017-02-07 ENCOUNTER — Other Ambulatory Visit: Payer: Self-pay | Admitting: Pediatrics

## 2017-02-07 ENCOUNTER — Telehealth: Payer: Self-pay | Admitting: Pediatrics

## 2017-02-07 ENCOUNTER — Encounter: Payer: Self-pay | Admitting: Pediatrics

## 2017-02-07 ENCOUNTER — Ambulatory Visit (INDEPENDENT_AMBULATORY_CARE_PROVIDER_SITE_OTHER): Payer: Medicare HMO | Admitting: Pediatrics

## 2017-02-07 VITALS — BP 136/83 | HR 62 | Temp 98.9°F | Ht 66.0 in | Wt 242.4 lb

## 2017-02-07 DIAGNOSIS — M545 Low back pain, unspecified: Secondary | ICD-10-CM

## 2017-02-07 DIAGNOSIS — G8929 Other chronic pain: Secondary | ICD-10-CM

## 2017-02-07 MED ORDER — PREDNISONE 20 MG PO TABS
ORAL_TABLET | ORAL | 0 refills | Status: DC
Start: 2017-02-07 — End: 2017-02-18

## 2017-02-07 NOTE — Telephone Encounter (Signed)
lmtcb needing to know how many times she checks her blood sugar and if she is wanting the accu-check nano smartview meter again

## 2017-02-07 NOTE — Patient Instructions (Signed)
Take tylenol up to 3 tabs of  a day if also taking one tylenol PM Take prednisone for 3 days Can then take ibuprofen 1-2 tabs up to twice a day if needed   Back Exercises If you have pain in your back, do these exercises 2-3 times each day or as told by your doctor. When the pain goes away, do the exercises once each day, but repeat the steps more times for each exercise (do more repetitions). If you do not have pain in your back, do these exercises once each day or as told by your doctor. Exercises Single Knee to Chest  Do these steps 3-5 times in a row for each leg: 1. Lie on your back on a firm bed or the floor with your legs stretched out. 2. Bring one knee to your chest. 3. Hold your knee to your chest by grabbing your knee or thigh. 4. Pull on your knee until you feel a gentle stretch in your lower back. 5. Keep doing the stretch for 10-30 seconds. 6. Slowly let go of your leg and straighten it.  Pelvic Tilt  Do these steps 5-10 times in a row: 1. Lie on your back on a firm bed or the floor with your legs stretched out. 2. Bend your knees so they point up to the ceiling. Your feet should be flat on the floor. 3. Tighten your lower belly (abdomen) muscles to press your lower back against the floor. This will make your tailbone point up to the ceiling instead of pointing down to your feet or the floor. 4. Stay in this position for 5-10 seconds while you gently tighten your muscles and breathe evenly.  Cat-Cow  Do these steps until your lower back bends more easily: 1. Get on your hands and knees on a firm surface. Keep your hands under your shoulders, and keep your knees under your hips. You may put padding under your knees. 2. Let your head hang down, and make your tailbone point down to the floor so your lower back is round like the back of a cat. 3. Stay in this position for 5 seconds. 4. Slowly lift your head and make your tailbone point up to the ceiling so your back  hangs low (sags) like the back of a cow. 5. Stay in this position for 5 seconds.  Press-Ups  Do these steps 5-10 times in a row: 1. Lie on your belly (face-down) on the floor. 2. Place your hands near your head, about shoulder-width apart. 3. While you keep your back relaxed and keep your hips on the floor, slowly straighten your arms to raise the top half of your body and lift your shoulders. Do not use your back muscles. To make yourself more comfortable, you may change where you place your hands. 4. Stay in this position for 5 seconds. 5. Slowly return to lying flat on the floor.  Bridges  Do these steps 10 times in a row: 1. Lie on your back on a firm surface. 2. Bend your knees so they point up to the ceiling. Your feet should be flat on the floor. 3. Tighten your butt muscles and lift your butt off of the floor until your waist is almost as high as your knees. If you do not feel the muscles working in your butt and the back of your thighs, slide your feet 1-2 inches farther away from your butt. 4. Stay in this position for 3-5 seconds. 5. Slowly lower your  butt to the floor, and let your butt muscles relax.  If this exercise is too easy, try doing it with your arms crossed over your chest. Back Lifts Do these steps 5-10 times in a row: 1. Lie on your belly (face-down) with your arms at your sides, and rest your forehead on the floor. 2. Tighten the muscles in your legs and your butt. 3. Slowly lift your chest off of the floor while you keep your hips on the floor. Keep the back of your head in line with the curve in your back. Look at the floor while you do this. 4. Stay in this position for 3-5 seconds. 5. Slowly lower your chest and your face to the floor.  Contact a doctor if:  Your back pain gets a lot worse when you do an exercise.  Your back pain does not lessen 2 hours after you exercise. If you have any of these problems, stop doing the exercises. Do not do them again  unless your doctor says it is okay. Get help right away if:  You have sudden, very bad back pain. If this happens, stop doing the exercises. Do not do them again unless your doctor says it is okay. This information is not intended to replace advice given to you by your health care provider. Make sure you discuss any questions you have with your health care provider. Document Released: 05/15/2010 Document Revised: 09/18/2015 Document Reviewed: 06/06/2014 Elsevier Interactive Patient Education  [employer].

## 2017-02-07 NOTE — Progress Notes (Signed)
  Subjective:   Patient ID: Allison Rollins, female    DOB: Aug 06, 1950, 66 y.o.   MRN: [id number] CC: low back pain HPI: Allison Rollins is a 66 y.o. female presenting for low back pain  Has had pain off and on, started again about 4 weeks ago No injury she knows of Taking muscle relaxer Helps some, only takes at night Still bothered with mid back pain R side >L side  No weakness in legs, no trouble urinating  being active makes pain worse No fevers No sciatica  Relevant past medical, surgical, family and social history reviewed. Allergies and medications reviewed and updated. History  Smoking Status  . Former Smoker  . Packs/day: 0.25  . Years: 30.00  . Types: Cigarettes  . Start date: 01/04/1965  . Quit date: 01/03/1995  Smokeless Tobacco  . Never Used   ROS: Per HPI   Objective:    BP 136/83   Pulse 62   Temp 98.9 F (37.2 C) (Oral)   Ht  (1.676 m)   Wt 242 lb 6.4 oz (110 kg)   BMI 39.12 kg/m   Wt Readings from Last 3 Encounters:  02/07/17 242 lb 6.4 oz (110 kg)  01/10/17 241 lb 6.4 oz (109.5 kg)  12/13/16 236 lb (107 kg)    Gen: NAD, alert, cooperative with exam, NCAT EYES: EOMI, no conjunctival injection, or no icterus ENT: OP without erythema LYMPH: no cervical LAD CV: NRRR, normal S1/S2, no murmur, distal pulses 2+ b/l Resp: CTABL, no wheezes, normal WOB Abd: +BS, soft, NTND. no guarding or organomegaly Ext: No edema, warm Neuro: Alert and oriented, strength equal b/l UE and LE, coordination grossly normal MSK: ttp R sided paraspinal muscles, no tenderness over spine  Assessment & Plan:  Diagnoses and all orders for this visit:  Chronic right-sided low back pain without sciatica No red flag symptoms Gave gentle back exercises, trial of below, if not improving at upcoming f/u refer for PT -     predniSONE (DELTASONE) 20 MG tablet; 2 po at same time daily for 3 days   Follow up plan: Return if symptoms worsen or fail to improve. Rex Kras,  MD [place]

## 2017-02-10 MED ORDER — BLOOD GLUCOSE MONITOR SYSTEM W/DEVICE KIT
1.0000 | PACK | Freq: Every day | 0 refills | Status: DC
Start: 2017-02-10 — End: 2017-12-25

## 2017-02-10 NOTE — Telephone Encounter (Signed)
Rx for meter sent to pharmacy and pt is aware.

## 2017-02-11 ENCOUNTER — Other Ambulatory Visit: Payer: Self-pay

## 2017-02-11 DIAGNOSIS — I10 Essential (primary) hypertension: Secondary | ICD-10-CM

## 2017-02-11 MED ORDER — METFORMIN HCL 500 MG PO TABS: ORAL_TABLET | ORAL | 0 refills | Status: DC

## 2017-02-11 MED ORDER — LISINOPRIL 10 MG PO TABS: 10.0000 mg | ORAL_TABLET | Freq: Every day | ORAL | 0 refills | Status: DC

## 2017-02-14 ENCOUNTER — Telehealth: Payer: Self-pay | Admitting: Pediatrics

## 2017-02-14 DIAGNOSIS — G8929 Other chronic pain: Secondary | ICD-10-CM

## 2017-02-14 DIAGNOSIS — M545 Low back pain: Principal | ICD-10-CM

## 2017-02-14 NOTE — Telephone Encounter (Signed)
Spoke with pt Feeling better today No fevers Coughing some off and on Sick about a week No SOB Discussed return precautions Back pain still bothering her Will refer to PT

## 2017-02-14 NOTE — Telephone Encounter (Signed)
Please advise if antibiotic will be sent in. 

## 2017-02-18 ENCOUNTER — Encounter: Payer: Self-pay | Admitting: Family Medicine

## 2017-02-18 ENCOUNTER — Ambulatory Visit (INDEPENDENT_AMBULATORY_CARE_PROVIDER_SITE_OTHER): Payer: Medicare HMO | Admitting: Family Medicine

## 2017-02-18 VITALS — BP 152/88 | HR 74 | Temp 98.7°F | Ht 66.0 in | Wt 239.0 lb

## 2017-02-18 DIAGNOSIS — J209 Acute bronchitis, unspecified: Secondary | ICD-10-CM | POA: Diagnosis not present

## 2017-02-18 DIAGNOSIS — J440 Chronic obstructive pulmonary disease with acute lower respiratory infection: Secondary | ICD-10-CM | POA: Diagnosis not present

## 2017-02-18 MED ORDER — AZITHROMYCIN 250 MG PO TABS: ORAL_TABLET | ORAL | 0 refills | Status: DC

## 2017-02-18 NOTE — Progress Notes (Signed)
Subjective:     Patient ID: Allison Rollins, female   DOB: [birth year], 66 y.o.   MRN: [id number]  HPI Patient is here today complaining with head congestion, cough, wheezing, and back pain.  Symptoms have been present about 3 weeks.  Patient has had more wheezing.  Cough is productive of yellow, green sputum.  She feels like congestion is more in her chest and head.  She denies having had any fever.  Review of Systems  Constitutional: Negative.   HENT: Positive for congestion.   Eyes: Negative.   Respiratory: Positive for cough and wheezing.   Cardiovascular: Negative.   Gastrointestinal: Negative.   Endocrine: Negative.   Genitourinary: Negative.   Musculoskeletal: Positive for back pain.  Skin: Negative.   Allergic/Immunologic: Negative.   Neurological: Negative.   Hematological: Negative.   Psychiatric/Behavioral: Negative.    Patient Active Problem List   Diagnosis Date Noted  . Depression 12/26/2015  . Chest pain 12/22/2015  . Generalized anxiety disorder 03/12/2015  . Prediabetes 03/12/2015  . Back pain 03/12/2015  . Stress incontinence 01/06/2015  . Chronic diastolic heart failure ([place]) 01/03/2015  . Hypokalemia 07/28/2013  . Obesity 07/01/2011  . Benign hypertension 07/01/2011  . Hyperlipidemia 07/01/2011  . Dysphagia 09/08/2010  . GERD (gastroesophageal reflux disease) 09/08/2010   Outpatient Encounter Prescriptions as of 02/18/2017  Medication Sig  . acetaminophen (TYLENOL) 500 MG tablet Take 500 mg by mouth every 6 (six) hours as needed for moderate pain.   Marland Kitchen albuterol (PROVENTIL HFA;VENTOLIN HFA) 108 (90 BASE) MCG/ACT inhaler Inhale 1-2 puffs into the lungs every 6 (six) hours as needed for wheezing.  Marland Kitchen aspirin EC 81 MG tablet Take 1 tablet (81 mg total) by mouth daily.  . blood glucose meter kit and supplies Accu-check Nano smartview meter  . Blood Glucose Monitoring Suppl (BLOOD GLUCOSE MONITOR SYSTEM) w/Device KIT 1 Device by Does not apply route daily.  .  cyclobenzaprine (FLEXERIL) 5 MG tablet Take by mouth. Take one to two tablets at bedtime.  . ergocalciferol (VITAMIN D2) 50000 units capsule Take 1 capsule (50,000 Units total) by mouth every Thursday.  . furosemide (LASIX) 40 MG tablet Take 1 tablet (40 mg total) by mouth 2 (two) times daily as needed (for swelling/fluid).  . gabapentin (NEURONTIN) 300 MG capsule Take 1-2 capsules (300-600 mg total) by mouth 2 (two) times daily. Take 324m in the morning, 6038mat night  . lisinopril (PRINIVIL,ZESTRIL) 10 MG tablet Take 1 tablet (10 mg total) by mouth daily.  . metFORMIN (GLUCOPHAGE) 500 MG tablet TAKE ONE TABLET BY MOUTH ONCE DAILY WITH BREAKFAST  . nitroGLYCERIN (NITROSTAT) 0.4 MG SL tablet Place 1 tablet (0.4 mg total) under the tongue every 5 (five) minutes as needed for chest pain.  . Omega-3 Fatty Acids (FISH OIL PO) Take 6 capsules by mouth daily.   . pantoprazole (PROTONIX) 40 MG tablet TAKE 1 TABLET EVERY DAY  . pramipexole (MIRAPEX) 0.25 MG tablet Take 0.5 tablets by mouth at bedtime.  . pravastatin (PRAVACHOL) 40 MG tablet Take 1 tablet (40 mg total) by mouth at bedtime.  . sertraline (ZOLOFT) 50 MG tablet Take 1 tablet (50 mg total) by mouth daily.  . [DISCONTINUED] predniSONE (DELTASONE) 20 MG tablet 2 po at same time daily for 3 days   No facility-administered encounter medications on file as of 02/18/2017.        Objective:   Physical Exam BP (!) 152/88   Pulse 74   Temp 98.7 F (37.1 C) (Oral)  Ht 5' 6"  (1.676 m)   Wt 239 lb (108.4 kg)   BMI 38.58 kg/m   Patient is nontoxic appearing. HEENT NT: Sinuses are nontender.  Ears and throat are unremarkable. Chest: Lungs have expiratory wheezes.  She does have a history of asthma and COPD but since she has had more wheezing and more sputum and colored sputum will treat as bronchitis     Assessment:     Bronchitis/COPD exacerbation    Plan:     Will treat with Z-Pak.  Continue albuterol inhaler as needed for wheezing.   Also try OTC Mucinex to keep cough loose.

## 2017-02-21 ENCOUNTER — Encounter: Payer: Self-pay | Admitting: Pediatrics

## 2017-02-21 ENCOUNTER — Ambulatory Visit (INDEPENDENT_AMBULATORY_CARE_PROVIDER_SITE_OTHER): Payer: Medicare HMO | Admitting: Pediatrics

## 2017-02-21 VITALS — BP 116/76 | HR 65 | Temp 98.8°F | Ht 66.0 in | Wt 237.2 lb

## 2017-02-21 DIAGNOSIS — E785 Hyperlipidemia, unspecified: Secondary | ICD-10-CM | POA: Diagnosis not present

## 2017-02-21 DIAGNOSIS — M545 Low back pain, unspecified: Secondary | ICD-10-CM

## 2017-02-21 DIAGNOSIS — E559 Vitamin D deficiency, unspecified: Secondary | ICD-10-CM

## 2017-02-21 DIAGNOSIS — Z780 Asymptomatic menopausal state: Secondary | ICD-10-CM

## 2017-02-21 DIAGNOSIS — K219 Gastro-esophageal reflux disease without esophagitis: Secondary | ICD-10-CM

## 2017-02-21 DIAGNOSIS — I10 Essential (primary) hypertension: Secondary | ICD-10-CM

## 2017-02-21 DIAGNOSIS — G8929 Other chronic pain: Secondary | ICD-10-CM

## 2017-02-21 DIAGNOSIS — F339 Major depressive disorder, recurrent, unspecified: Secondary | ICD-10-CM | POA: Diagnosis not present

## 2017-02-21 DIAGNOSIS — R7303 Prediabetes: Secondary | ICD-10-CM | POA: Diagnosis not present

## 2017-02-21 DIAGNOSIS — G629 Polyneuropathy, unspecified: Secondary | ICD-10-CM | POA: Diagnosis not present

## 2017-02-21 LAB — BAYER DCA HB A1C WAIVED: HB A1C (BAYER DCA - WAIVED): 5.9 % (ref <7.0)

## 2017-02-21 MED ORDER — PRAVASTATIN SODIUM 40 MG PO TABS: 40.0000 mg | ORAL_TABLET | Freq: Every day | ORAL | 3 refills | Status: DC

## 2017-02-21 MED ORDER — FUROSEMIDE 40 MG PO TABS: 40.0000 mg | ORAL_TABLET | Freq: Two times a day (BID) | ORAL | 3 refills | Status: DC | PRN

## 2017-02-21 MED ORDER — GABAPENTIN 300 MG PO CAPS: 300.0000 mg | ORAL_CAPSULE | Freq: Two times a day (BID) | ORAL | 0 refills | Status: DC

## 2017-02-21 MED ORDER — SERTRALINE HCL 50 MG PO TABS: 50.0000 mg | ORAL_TABLET | Freq: Every day | ORAL | 3 refills | Status: DC

## 2017-02-21 MED ORDER — ERGOCALCIFEROL 1.25 MG (50000 UT) PO CAPS: 50000.0000 [IU] | ORAL_CAPSULE | ORAL | 0 refills | Status: DC

## 2017-02-21 MED ORDER — LISINOPRIL 10 MG PO TABS: 10.0000 mg | ORAL_TABLET | Freq: Every day | ORAL | 0 refills | Status: DC

## 2017-02-21 MED ORDER — METFORMIN HCL 500 MG PO TABS: ORAL_TABLET | ORAL | 0 refills | Status: DC

## 2017-02-21 MED ORDER — PANTOPRAZOLE SODIUM 40 MG PO TBEC: 40.0000 mg | DELAYED_RELEASE_TABLET | Freq: Every day | ORAL | 0 refills | Status: DC

## 2017-02-21 NOTE — Progress Notes (Signed)
Subjective:   Patient ID: Allison HippoGAIL R Rollins, female    DOB: 1950/05/31, 66 y.o.   MRN: [id number] CC: Follow-up (3 month) multiple med problems  HPI: Allison Rollins is a 66 y.o. female presenting for Follow-up (3 month)  Has cataracts starting on both eyes Follow up in 1 yr, no surgery now  Had sleep study done, does not need CPAP per pt  Low back pain ongoing, has tried rest, gentle home exercises Interested in trying physical therapy  Parkinsons Disease: following with Dr. Gerilyn [place]qu[place]  Prediabetes: avoiding sugary foods Taking metformin regularly  HLD: no s/e from statin, taking regularly  Depression: mood has been good past few weeks  HTN: taking lisinopril regularly, no CP, no HA  Relevant past medical, surgical, family and social history reviewed. Allergies and medications reviewed and updated. History  Smoking Status  . Former Smoker  . Packs/day: 0.25  . Years: 30.00  . Types: Cigarettes  . Start date: 01/04/1965  . Quit date: 01/03/1995  Smokeless Tobacco  . Never Used   ROS: Per HPI   Objective:    BP 116/76   Pulse 65   Temp 98.8 F (37.1 C) (Oral)   Ht 5\' 6"  (1.676 m)   Wt 237 lb 3.2 oz (107.6 kg)   BMI 38.29 kg/m   Wt Readings from Last 3 Encounters:  02/21/17 237 lb 3.2 oz (107.6 kg)  02/18/17 239 lb (108.4 kg)  02/07/17 242 lb 6.4 oz (110 kg)    Gen: NAD, alert, cooperative with exam, NCAT EYES: EOMI, no conjunctival injection, or no icterus ENT:  OP without erythema LYMPH: no cervical LAD CV: NRRR, normal S1/S2, no murmur, distal pulses 2+ b/l Resp: CTABL, no wheezes, normal WOB Abd: +BS, soft, NTND.  Ext: No edema, warm Neuro: Alert and oriented MSK: normal muscle bulk Psych: normal affect, no thoughts of self harm  Assessment & Plan:  Allison Rollins was seen today for follow-up multiple med problems  Diagnoses and all orders for this visit:  Hyperlipidemia, unspecified hyperlipidemia type Stable, cont below -     pravastatin (PRAVACHOL) 40 MG  tablet; Take 1 tablet (40 mg total) by mouth at bedtime.  Vitamin D deficiency take below, then start daily vit D -     ergocalciferol (VITAMIN D2) 50000 units capsule; Take 1 capsule (50,000 Units total) by mouth every Thursday.  Benign hypertension Stable, well controlled, cont  -     lisinopril (PRINIVIL,ZESTRIL) 10 MG tablet; Take 1 tablet (10 mg total) by mouth daily.  Neuropathy Stable, cont below -     gabapentin (NEURONTIN) 300 MG capsule; Take 1-2 capsules (300-600 mg total) by mouth 2 (two) times daily. Take 300mg  in the morning, 600mg  at night  Depression Stable, cont below -     sertraline (ZOLOFT) 50 MG tablet; Take 1 tablet (50 mg total) by mouth daily.  Diabetes A1c 5.9 -     Bayer DCA Hb A1c Waived -     metFORMIN (GLUCOPHAGE) 500 MG tablet; TAKE ONE TABLET BY MOUTH ONCE DAILY WITH BREAKFAST  Gastroesophageal reflux disease, esophagitis presence not specified Stable, cont below -     pantoprazole (PROTONIX) 40 MG tablet; Take 1 tablet (40 mg total) by mouth daily.  Chronic bilateral low back pain without sciatica -     Ambulatory referral to Physical Therapy  Post-menopausal -     DG [facility] DEXA  Other orders -     furosemide (LASIX) 40 MG tablet; Take 1 tablet (40 mg  total) by mouth 2 (two) times daily as needed (for swelling/fluid).   Follow up plan: Return in about 3 months (around 05/24/2017). Rex Kras, MD [place] [facility]

## 2017-02-23 ENCOUNTER — Telehealth: Payer: Self-pay | Admitting: Pediatrics

## 2017-02-23 NOTE — Telephone Encounter (Signed)
Pt aware of lab results and DEXA scheduled for 02/28/17

## 2017-02-28 ENCOUNTER — Ambulatory Visit (INDEPENDENT_AMBULATORY_CARE_PROVIDER_SITE_OTHER): Payer: Medicare HMO

## 2017-02-28 DIAGNOSIS — Z780 Asymptomatic menopausal state: Secondary | ICD-10-CM | POA: Diagnosis not present

## 2017-02-28 DIAGNOSIS — M85832 Other specified disorders of bone density and structure, left forearm: Secondary | ICD-10-CM | POA: Diagnosis not present

## 2017-02-28 DIAGNOSIS — Z13820 Encounter for screening for osteoporosis: Secondary | ICD-10-CM | POA: Diagnosis not present

## 2017-03-01 ENCOUNTER — Telehealth: Payer: Self-pay | Admitting: Pediatrics

## 2017-03-01 NOTE — Telephone Encounter (Signed)
Patient states she is having back pain. Appt given for tomorrow. Carlon is xray spoke with patient at length about dexascans and what they show.

## 2017-03-02 ENCOUNTER — Ambulatory Visit: Payer: Medicare HMO | Admitting: Pediatrics

## 2017-03-02 NOTE — Telephone Encounter (Signed)
Please advise on medication for back pain?

## 2017-03-03 ENCOUNTER — Telehealth: Payer: Self-pay | Admitting: Pediatrics

## 2017-03-03 NOTE — Telephone Encounter (Signed)
Can you schedule appt with me to discuss dexa results and back pain, can be tomorrow.  Bone density scan only tells me abou there risk of fracture, not anything about her current back pain so we may need to do something else about her back pain.

## 2017-03-03 NOTE — Telephone Encounter (Signed)
appt made

## 2017-03-04 ENCOUNTER — Ambulatory Visit: Payer: Medicare HMO | Admitting: Pediatrics

## 2017-03-04 ENCOUNTER — Other Ambulatory Visit: Payer: Self-pay | Admitting: Pediatrics

## 2017-03-04 ENCOUNTER — Encounter: Payer: Self-pay | Admitting: Pediatrics

## 2017-03-04 ENCOUNTER — Ambulatory Visit (INDEPENDENT_AMBULATORY_CARE_PROVIDER_SITE_OTHER): Payer: Medicare HMO

## 2017-03-04 VITALS — BP 133/87 | HR 64 | Temp 98.1°F | Ht 66.0 in | Wt 236.0 lb

## 2017-03-04 DIAGNOSIS — M85839 Other specified disorders of bone density and structure, unspecified forearm: Secondary | ICD-10-CM | POA: Diagnosis not present

## 2017-03-04 DIAGNOSIS — M5441 Lumbago with sciatica, right side: Secondary | ICD-10-CM | POA: Diagnosis not present

## 2017-03-04 DIAGNOSIS — M5442 Lumbago with sciatica, left side: Secondary | ICD-10-CM | POA: Diagnosis not present

## 2017-03-04 DIAGNOSIS — M545 Low back pain: Secondary | ICD-10-CM | POA: Diagnosis not present

## 2017-03-04 MED ORDER — PREDNISONE 20 MG PO TABS: 40.0000 mg | ORAL_TABLET | Freq: Every day | ORAL | 0 refills | Status: AC

## 2017-03-04 MED ORDER — TRAMADOL HCL 50 MG PO TABS
50.0000 mg | ORAL_TABLET | Freq: Two times a day (BID) | ORAL | 0 refills | Status: DC | PRN
Start: 2017-03-04 — End: 2017-04-27

## 2017-03-04 NOTE — Progress Notes (Signed)
  Subjective:   Patient ID: Allison HippoGAIL R Shan, female    DOB: Jan 28, 1951, 66 y.o.   MRN: [id number] CC: Back Pain and Dexa scan  HPI: Allison Rollins is a 66 y.o. female presenting for Back Pain and Dexa scan  R sided back pain, has to pull herself up with a cane from sitting now When she steps down steps with R leg her back catches, sometimes pain goes down her R leg  Bothers her in the day and at night Sitting it will be ok as long as has good support on back, anytime she has to get up from sitting to standing the back pain feels sharp Standing for periods of time such as with when washing her dishes back pain also bothers her Has not had any recent falls or injuries No h/o cancer No trouble emptying bladder, no incontinence No weakness R leg No tingling/numbness in feet/legs  Osteopenia: recent DEXA No personal or fam h/o fracture No EtOH use  Relevant past medical, surgical, family and social history reviewed. Allergies and medications reviewed and updated. Social History   Tobacco Use  Smoking Status Former Smoker  . Packs/day: 0.25  . Years: 30.00  . Pack years: 7.50  . Types: Cigarettes  . Start date: 01/04/1965  . Last attempt to quit: 01/03/1995  . Years since quitting: 22.1  Smokeless Tobacco Never Used   ROS: Per HPI   Objective:    BP 133/87   Pulse 64   Temp 98.1 F (36.7 C) (Oral)   Ht 5\' 6"  (1.676 m)   Wt 236 lb (107 kg)   BMI 38.09 kg/m   Wt Readings from Last 3 Encounters:  03/04/17 236 lb (107 kg)  02/21/17 237 lb 3.2 oz (107.6 kg)  02/18/17 239 lb (108.4 kg)    Gen: NAD, alert, cooperative with exam, NCAT EYES: EOMI, no conjunctival injection, or no icterus ENT: OP without erythema LYMPH: no cervical LAD CV: NRRR, normal S1/S2, no murmur, distal pulses 2+ b/l Resp: CTABL, no wheezes, normal WOB Abd: +BS, soft, NTND.  Ext: No edema, warm Neuro: Alert and oriented, strength equal b/l UE and LE, sensation intact lower ext MSK: ttp upper lumbar  spine, R sided paraspinal muscles  Assessment & Plan:  Allison Rollins was seen today for back pain and dexa scan.  Diagnoses and all orders for this visit:  Acute right-sided low back pain with bilateral sciatica Trial steroid burst Ongoing now for over two months, symptoms worsening  #10 tabs of tramadol given for severe pain -     Walker standard -     DG Lumbar Spine 2-3 Views; Future -     traMADol (ULTRAM) 50 MG tablet; Take 1 tablet (50 mg total) every 12 (twelve) hours as needed by mouth. -     MR Lumbar Spine Wo Contrast; Future -     predniSONE (DELTASONE) 20 MG tablet; Take 2 tablets (40 mg total) daily with breakfast for 3 days by mouth.  Osteopenia of forearm, unspecified laterality FRAX <1% for hip fracture, 8% for all fracture in 10 years Continue vitamin D, calcium  Follow up plan: 4 weeks Rex Krasarol Kennith Morss, MD [facility]

## 2017-03-04 NOTE — Patient Instructions (Signed)
Start physical therapy Tramadol for severe back pain. Because it is a controlled substance/strong pain medicine we have to see you back in clinic for refills if needed. Prednisone 40mg  for 3 days Ibuprofen 400mg  up to three times a day

## 2017-03-10 ENCOUNTER — Ambulatory Visit (HOSPITAL_COMMUNITY)
Admission: RE | Admit: 2017-03-10 | Discharge: 2017-03-10 | Disposition: A | Discharge status: Home / Self Care | Payer: Medicare HMO | Source: Ambulatory Visit | Attending: Pediatrics | Admitting: Pediatrics

## 2017-03-10 DIAGNOSIS — M4802 Spinal stenosis, cervical region: Secondary | ICD-10-CM | POA: Diagnosis not present

## 2017-03-10 DIAGNOSIS — M5136 Other intervertebral disc degeneration, lumbar region: Secondary | ICD-10-CM | POA: Diagnosis not present

## 2017-03-10 DIAGNOSIS — M5441 Lumbago with sciatica, right side: Secondary | ICD-10-CM | POA: Insufficient documentation

## 2017-03-10 DIAGNOSIS — M5442 Lumbago with sciatica, left side: Secondary | ICD-10-CM | POA: Insufficient documentation

## 2017-03-10 DIAGNOSIS — M48061 Spinal stenosis, lumbar region without neurogenic claudication: Secondary | ICD-10-CM | POA: Insufficient documentation

## 2017-03-10 DIAGNOSIS — M5126 Other intervertebral disc displacement, lumbar region: Secondary | ICD-10-CM | POA: Diagnosis not present

## 2017-03-10 DIAGNOSIS — M8938 Hypertrophy of bone, other site: Secondary | ICD-10-CM | POA: Insufficient documentation

## 2017-03-10 DIAGNOSIS — M2578 Osteophyte, vertebrae: Secondary | ICD-10-CM | POA: Insufficient documentation

## 2017-03-11 ENCOUNTER — Other Ambulatory Visit: Payer: Self-pay | Admitting: *Deleted

## 2017-03-11 ENCOUNTER — Telehealth: Payer: Self-pay | Admitting: Pediatrics

## 2017-03-11 ENCOUNTER — Other Ambulatory Visit: Payer: Self-pay | Admitting: Pediatrics

## 2017-03-11 DIAGNOSIS — F339 Major depressive disorder, recurrent, unspecified: Secondary | ICD-10-CM

## 2017-03-11 DIAGNOSIS — I10 Essential (primary) hypertension: Secondary | ICD-10-CM

## 2017-03-11 DIAGNOSIS — M48061 Spinal stenosis, lumbar region without neurogenic claudication: Secondary | ICD-10-CM

## 2017-03-11 DIAGNOSIS — R7303 Prediabetes: Secondary | ICD-10-CM

## 2017-03-11 MED ORDER — LISINOPRIL 10 MG PO TABS: 10.0000 mg | ORAL_TABLET | Freq: Every day | ORAL | 0 refills | Status: DC

## 2017-03-11 MED ORDER — SERTRALINE HCL 50 MG PO TABS: 50.0000 mg | ORAL_TABLET | Freq: Every day | ORAL | 0 refills | Status: DC

## 2017-03-11 MED ORDER — METFORMIN HCL 500 MG PO TABS: ORAL_TABLET | ORAL | 0 refills | Status: DC

## 2017-03-11 NOTE — Telephone Encounter (Signed)
Discussed further with pt and she wanted me to call her son, but he did not answer and has NO VM .

## 2017-03-11 NOTE — Telephone Encounter (Signed)
Just reviewed with pt

## 2017-03-11 NOTE — Telephone Encounter (Signed)
Result note now in 

## 2017-03-12 ENCOUNTER — Telehealth: Payer: Self-pay | Admitting: Pediatrics

## 2017-03-12 NOTE — Telephone Encounter (Signed)
Please advise 

## 2017-03-12 NOTE — Telephone Encounter (Signed)
What symptoms do you have? Lower back pain when she moves   How long have you been sick? About three weeks  Have you been seen for this problem? Yes she is getting referred to a surgeon  If your provider decides to give you a prescription, which pharmacy would you like for it to be sent to? Wants to know if she can take ibuprofen, pt says Dr. Oswaldo DoneVincent told her she could but wants to know if she can take two every for hours    Patient informed that this information will be sent to the clinical staff for review and that they should receive a follow up call.

## 2017-03-14 NOTE — Telephone Encounter (Signed)
Ibuprofen is fine but can only take every 6 hours.

## 2017-03-15 NOTE — Telephone Encounter (Signed)
Left voicemail for patient to return call.

## 2017-03-18 ENCOUNTER — Other Ambulatory Visit: Payer: Self-pay | Admitting: *Deleted

## 2017-03-18 DIAGNOSIS — F339 Major depressive disorder, recurrent, unspecified: Secondary | ICD-10-CM

## 2017-03-18 DIAGNOSIS — R7303 Prediabetes: Secondary | ICD-10-CM

## 2017-03-18 DIAGNOSIS — E559 Vitamin D deficiency, unspecified: Secondary | ICD-10-CM

## 2017-03-18 DIAGNOSIS — I10 Essential (primary) hypertension: Secondary | ICD-10-CM

## 2017-03-18 DIAGNOSIS — G629 Polyneuropathy, unspecified: Secondary | ICD-10-CM

## 2017-03-18 MED ORDER — METFORMIN HCL 500 MG PO TABS: ORAL_TABLET | ORAL | 0 refills | Status: DC

## 2017-03-18 MED ORDER — GABAPENTIN 300 MG PO CAPS: 300.0000 mg | ORAL_CAPSULE | Freq: Two times a day (BID) | ORAL | 0 refills | Status: DC

## 2017-03-18 MED ORDER — SERTRALINE HCL 50 MG PO TABS: 50.0000 mg | ORAL_TABLET | Freq: Every day | ORAL | 0 refills | Status: DC

## 2017-03-18 MED ORDER — LISINOPRIL 10 MG PO TABS: 10.0000 mg | ORAL_TABLET | Freq: Every day | ORAL | 0 refills | Status: DC

## 2017-03-18 NOTE — Addendum Note (Signed)
Addended by: Julious PayerHOLT, Shakirah Kirkey D on: 03/18/2017 11:38 AM   Modules accepted: Orders

## 2017-03-24 DIAGNOSIS — M5136 Other intervertebral disc degeneration, lumbar region: Secondary | ICD-10-CM | POA: Diagnosis not present

## 2017-03-24 DIAGNOSIS — Z6838 Body mass index (BMI) 38.0-38.9, adult: Secondary | ICD-10-CM | POA: Diagnosis not present

## 2017-03-24 DIAGNOSIS — I10 Essential (primary) hypertension: Secondary | ICD-10-CM | POA: Diagnosis not present

## 2017-03-31 ENCOUNTER — Encounter: Payer: Self-pay | Admitting: Physical Therapy

## 2017-03-31 ENCOUNTER — Ambulatory Visit: Payer: Medicare HMO | Attending: Neurosurgery | Admitting: Physical Therapy

## 2017-03-31 DIAGNOSIS — M5441 Lumbago with sciatica, right side: Secondary | ICD-10-CM | POA: Diagnosis not present

## 2017-03-31 DIAGNOSIS — M6281 Muscle weakness (generalized): Secondary | ICD-10-CM | POA: Diagnosis not present

## 2017-03-31 DIAGNOSIS — R293 Abnormal posture: Secondary | ICD-10-CM | POA: Diagnosis not present

## 2017-03-31 DIAGNOSIS — G8929 Other chronic pain: Secondary | ICD-10-CM

## 2017-03-31 NOTE — Therapy (Signed)
[facility] [address] [place], [place], [address] Phone: [phone]   Fax:  [phone]  Physical Therapy Evaluation  Patient Details  Name: Allison Rollins MRN: [id number] Date of Birth: 01-23-1951 Referring Provider: Julio Sicks MD.   Encounter Date: 03/31/2017  PT End of Session - 03/31/17 1428    Visit Number  1    Number of Visits  16    Date for PT Re-Evaluation  05/30/17    PT Start Time  0142    PT Stop Time  0226    PT Time Calculation (min)  44 min    Activity Tolerance  Patient tolerated treatment well Patient enjoyed heat and e'stim.    Behavior During Therapy  [facility] for tasks assessed/performed       Past Medical History:  Diagnosis Date  . Anxiety   . Asthma   . CHF (congestive heart failure) (HCC)   . COPD (chronic obstructive pulmonary disease) (HCC)   . Depression   . Diabetes mellitus without complication (HCC)   . HTN (hypertension)   . Hypoglycemia   . Kidney stones   . Parkinson's disease   . Pneumonia   . S/P colonoscopy 10/15/04   normal    Past Surgical History:  Procedure Laterality Date  . CHOLECYSTECTOMY    . COLONOSCOPY  10/15/2004   Normal rectum/Normal colon  . COLONOSCOPY N/A 05/10/2016   Procedure: COLONOSCOPY;  Surgeon: West Bali, MD;  [facility];  Service: Endoscopy;  Laterality: N/A;  10:30  . ESOPHAGOGASTRODUODENOSCOPY  09/11/2010   Dysphagia likely multifactorial (possible Candida esophagitis, likely nonspecific esophageal motility disorder, and/or uncontrolled gastroesophageal reflux disease), status post empiric dilation  . KIDNEY STONE SURGERY      There were no vitals filed for this visit.   Subjective Assessment - 03/31/17 1505    Subjective  The patient presents to OPPT with c/o of chronic low back pain that has been ongoing and worsening over the last 2+ years.  Her pain is around a 6/10 today but can rise to 10/10 with increased activity.  She reports pain  goes into her right buttock, posterior thigh to level of knee.  She states that prior to her husband passing she often times had to transfer him and help him get up from the floor after falling.  Rest helps decrease her pain.    Pertinent History  Parkinson.    Limitations  Standing    How long can you stand comfortably?  10-15 minutes.    Diagnostic tests  MRI:  L4-5 moderate stenosis and disc bulging; bilateral L5 impingement.  X-ray:  Moderate to severe L4 to S1 facet arthropathy.    Patient Stated Goals  Stand staright and get around with less pain.    Currently in Pain?  Yes    Pain Score  6     Pain Location  Back    Pain Orientation  Right;Left;Mid;Lower    Pain Descriptors / Indicators  Sore    Pain Type  Chronic pain    Pain Radiating Towards  Right LE.    Pain Onset  More than a month ago    Pain Frequency  Constant    Aggravating Factors   Increased standing.    Pain Relieving Factors  Rest.    Multiple Pain Sites  No         [facility] PT Assessment - 03/31/17 0001      Assessment   Medical Diagnosis  DDD, Lumbar.    Referring Provider  Julio SicksHenry Pool MD.    Onset Date/Surgical Date  -- 2+ years.      Precautions   Precautions  None      Restrictions   Weight Bearing Restrictions  No      Balance Screen   Has the patient fallen in the past 6 months  Yes    How many times?  -- 1.    Has the patient had a decrease in activity level because of a fear of falling?   No    Is the patient reluctant to leave their home because of a fear of falling?   No      Home Environment   Living Environment  Private residence      Prior Function   Level of Independence  Independent      Observation/Other Assessments   Focus on Therapeutic Outcomes (FOTO)   62% limitation.      Posture/Postural Control   Posture/Postural Control  Postural limitations    Postural Limitations  Rounded Shoulders;Forward head;Decreased lumbar lordosis;Increased thoracic kyphosis;Flexed trunk    Posture  Comments  Left convexity lumbar scoliosis.  Left iliac crest higher than right.      ROM / Strength   AROM / PROM / Strength  AROM;Strength      AROM   Overall AROM Comments  Bilateral hip flexion in supine= 90 degrees.  Her lumbar spine is extremely hypomobile with only 10% of intervertebral lumbar movement into flexion and extension limited only to the upright position.      Strength   Overall Strength Comments  Bilateral hip flexion and abduction= 4-/5; bilateral knee extension= 4+/5 and bilateral ankle strength= 5/5.      Palpation   Palpation comment  CC is pain "in" spine though her entire lumbar erector spinae is very tigh/taut to palpation.      Special Tests    Special Tests  Lumbar;Leg LengthTest Bilateral LE DTR's= 1+ to 2+/4+.    Lumbar Tests  -- (-) SLR testing.    Leg length test   -- (=) leg lengths.      Transfers   Comments  Supine to sit requires HHA.      Ambulation/Gait   Gait Pattern  Decreased step length - right;Decreased step length - left;Decreased stride length;Decreased hip/knee flexion - right;Decreased hip/knee flexion - left;Decreased dorsiflexion - right;Decreased dorsiflexion - left;Lateral trunk lean to right;Trunk flexed    Gait Comments  Patient uses a straight cane for safe ambulation.             Objective measurements completed on examination: See above findings.      [facility] Adult PT Treatment/Exercise - 03/31/17 0001      Modalities   Modalities  Electrical Stimulation;Moist Heat      Moist Heat Therapy   Number Minutes Moist Heat  15 Minutes    Moist Heat Location  Lumbar Spine      Electrical Stimulation   Electrical Stimulation Location  Low back.    Electrical Stimulation Action  IFC in seated position.    Electrical Stimulation Parameters  80-150 Hz x 15 minutes.    Electrical Stimulation Goals  Tone;Pain               PT Short Term Goals - 03/31/17 1542      PT SHORT TERM GOAL #1   Title  STG's=LTG's.         PT Long Term Goals -  04/08/2017 1543      PT LONG TERM GOAL #1   Title  Independent with a HEP.    Time  8    Period  Weeks    Status  New      PT LONG TERM GOAL #2   Title  Stand 20 minutes with pain not > 3/10.    Time  8    Period  Weeks    Status  New      PT LONG TERM GOAL #3   Title  Perform ADL's with pain not > 4/10.    Time  8    Period  Weeks    Status  New      PT LONG TERM GOAL #4   Title  Patient able to stand straight while walking.    Time  8    Period  Weeks    Status  New      PT LONG TERM GOAL #5   Title  Eliminate right LE symptoms.    Time  8    Period  Weeks    Status  New             Plan - April 08, 2017 1526    Clinical Impression Statement  The patient presents to OPPT with c/o chronic low back pain with radiation into her right LE.  She has lumbar scoliosis and multiple other postural abnormalites.  Her spinal range of motion is severely limited and her lumbar musculature is very palpably tight/taut to touch.  Her pain and deficits have greatly limited her functional mobility.  She walks in right lateral trunk flexion.  patient will benefit from skilled physical therapy intervention.    History and Personal Factors relevant to plan of care:  Parkinson.    Clinical Presentation  Evolving    Clinical Decision Making  Moderate    Rehab Potential  Good    PT Frequency  2x / week    PT Duration  8 weeks    PT Treatment/Interventions  ADLs/Self Care Home Management;Cryotherapy;Electrical Stimulation;Ultrasound;Moist Heat;Functional mobility training;Therapeutic activities;Therapeutic exercise;Patient/family education;Manual techniques;Passive range of motion;Dry needling    PT Next Visit Plan  Lumbar extension and left sidebending to improve posture.  Patient would benefit from U/S and STW/M to reduce spinal musculature tone and tightness.  HMP and eletrical stimulation    Consulted and Agree with Plan of Care  Patient       Patient will  benefit from skilled therapeutic intervention in order to improve the following deficits and impairments:  Abnormal gait, Decreased activity tolerance, Decreased range of motion, Decreased strength, Hypomobility, Impaired tone, Postural dysfunction, Pain  Visit Diagnosis: Chronic midline low back pain with right-sided sciatica - Plan: PT plan of care cert/re-cert  Abnormal posture - Plan: PT plan of care cert/re-cert  Muscle weakness (generalized) - Plan: PT plan of care cert/re-cert  G-Codes - Apr 08, 2017 1518    Functional Assessment Tool Used (Outpatient Only)  FOTO...62% limitation.    Functional Limitation  Mobility: Walking and moving around    Mobility: Walking and Moving Around Current Status [phone])  At least 60 percent but less than 80 percent impaired, limited or restricted    Mobility: Walking and Moving Around Goal Status [phone])  At least 20 percent but less than 40 percent impaired, limited or restricted        Problem List Patient Active Problem List   Diagnosis Date Noted  . Osteopenia of forearm 03/04/2017  . Depression 12/26/2015  .  Chest pain 12/22/2015  . Generalized anxiety disorder 03/12/2015  . Prediabetes 03/12/2015  . Back pain 03/12/2015  . Stress incontinence 01/06/2015  . Chronic diastolic heart failure (HCC) 01/03/2015  . Hypokalemia 07/28/2013  . Obesity 07/01/2011  . Benign hypertension 07/01/2011  . Hyperlipidemia 07/01/2011  . Dysphagia 09/08/2010  . GERD (gastroesophageal reflux disease) 09/08/2010    Netasha Wehrli, [place] MPT 03/31/2017, 3:51 PM  [facility] [address] [place], [place], [address] Phone: [phone]   Fax:  [phone]  Name: Allison Rollins MRN: [id number] Date of Birth: 1950/06/01

## 2017-04-01 ENCOUNTER — Ambulatory Visit: Payer: Medicare HMO | Admitting: Pediatrics

## 2017-04-06 ENCOUNTER — Ambulatory Visit: Payer: Medicare HMO | Admitting: Physical Therapy

## 2017-04-06 ENCOUNTER — Encounter: Payer: Self-pay | Admitting: Physical Therapy

## 2017-04-06 DIAGNOSIS — R293 Abnormal posture: Secondary | ICD-10-CM

## 2017-04-06 DIAGNOSIS — M5441 Lumbago with sciatica, right side: Principal | ICD-10-CM

## 2017-04-06 DIAGNOSIS — G8929 Other chronic pain: Secondary | ICD-10-CM

## 2017-04-06 DIAGNOSIS — M6281 Muscle weakness (generalized): Secondary | ICD-10-CM

## 2017-04-06 NOTE — Therapy (Signed)
[facility] [address] [place], [place], [address] Phone: [phone]   Fax:  [phone]  Physical Therapy Treatment  Patient Details  Name: Allison Rollins MRN: [id number] Date of Birth: 1950/11/16 Referring Provider: Julio SicksHenry Pool MD.   Encounter Date: 04/06/2017  PT End of Session - 04/06/17 1224    Visit Number  2    Date for PT Re-Evaluation  05/30/17    PT Start Time  1115    PT Stop Time  1209    PT Time Calculation (min)  54 min    Activity Tolerance  Patient tolerated treatment well    Behavior During Therapy  [facility] for tasks assessed/performed       Past Medical History:  Diagnosis Date  . Anxiety   . Asthma   . CHF (congestive heart failure) (HCC)   . COPD (chronic obstructive pulmonary disease) (HCC)   . Depression   . Diabetes mellitus without complication (HCC)   . HTN (hypertension)   . Hypoglycemia   . Kidney stones   . Parkinson's disease   . Pneumonia   . S/P colonoscopy 10/15/04   normal    Past Surgical History:  Procedure Laterality Date  . CHOLECYSTECTOMY    . COLONOSCOPY  10/15/2004   Normal rectum/Normal colon  . COLONOSCOPY N/A 05/10/2016   Procedure: COLONOSCOPY;  Surgeon: West BaliSandi L Fields, MD;  [facility];  Service: Endoscopy;  Laterality: N/A;  10:30  . ESOPHAGOGASTRODUODENOSCOPY  09/11/2010   Dysphagia likely multifactorial (possible Candida esophagitis, likely nonspecific esophageal motility disorder, and/or uncontrolled gastroesophageal reflux disease), status post empiric dilation  . KIDNEY STONE SURGERY      There were no vitals filed for this visit.  Subjective Assessment - 04/06/17 1225    Subjective  That treatment helped a lot.    Pain Score  3     Pain Location  Back    Pain Orientation  Right;Left;Mid;Lower    Pain Descriptors / Indicators  Sore    Pain Type  Chronic pain    Pain Onset  More than a month ago                      [facility]  Treatment/Exercise - 04/06/17 0001      Exercises   Exercises  Knee/Hip      Modalities   Modalities  Electrical Stimulation;Moist Heat;Ultrasound      Moist Heat Therapy   Number Minutes Moist Heat  20 Minutes    Moist Heat Location  Lumbar Spine      Electrical Stimulation   Electrical Stimulation Location  Low back.    Electrical Stimulation Action  IFC    Electrical Stimulation Parameters  80-150 Hz x 20 minutes.    Electrical Stimulation Goals  Tone;Pain      Ultrasound   Ultrasound Location  -- Lumbar musculature.    Ultrasound Parameters  1.50 w/CM2 x 10 minutes.    Ultrasound Goals  Pain      Manual Therapy   Manual Therapy  Soft tissue mobilization    Manual therapy comments  In seated position with patient resting upper body on 2 pillows on plinth:  STW/M to lumbar erector spinae musculature with bilateral QL release x 13 minutes.               PT Short Term Goals - 03/31/17 1542      PT SHORT TERM GOAL #1   Title  STG's=LTG's.        PT Long Term Goals - 03/31/17 1543      PT LONG TERM GOAL #1   Title  Independent with a HEP.    Time  8    Period  Weeks    Status  New      PT LONG TERM GOAL #2   Title  Stand 20 minutes with pain not > 3/10.    Time  8    Period  Weeks    Status  New      PT LONG TERM GOAL #3   Title  Perform ADL's with pain not > 4/10.    Time  8    Period  Weeks    Status  New      PT LONG TERM GOAL #4   Title  Patient able to stand straight while walking.    Time  8    Period  Weeks    Status  New      PT LONG TERM GOAL #5   Title  Eliminate right LE symptoms.    Time  8    Period  Weeks    Status  New            Plan - 04/06/17 1230    Clinical Impression Statement  Excellent response to treatment today.  She continues to have a great deal of spinal musculature tone.    Consulted and Agree with Plan of Care  Patient       Patient will benefit from skilled therapeutic intervention in order to  improve the following deficits and impairments:  Abnormal gait, Decreased activity tolerance, Decreased range of motion, Decreased strength, Hypomobility, Impaired tone, Postural dysfunction, Pain  Visit Diagnosis: Chronic midline low back pain with right-sided sciatica  Abnormal posture  Muscle weakness (generalized)     Problem List Patient Active Problem List   Diagnosis Date Noted  . Osteopenia of forearm 03/04/2017  . Depression 12/26/2015  . Chest pain 12/22/2015  . Generalized anxiety disorder 03/12/2015  . Prediabetes 03/12/2015  . Back pain 03/12/2015  . Stress incontinence 01/06/2015  . Chronic diastolic heart failure (HCC) 01/03/2015  . Hypokalemia 07/28/2013  . Obesity 07/01/2011  . Benign hypertension 07/01/2011  . Hyperlipidemia 07/01/2011  . Dysphagia 09/08/2010  . GERD (gastroesophageal reflux disease) 09/08/2010    Casey Maxfield, [place] MPT 04/06/2017, 12:32 PM  [facility] [address] [place], [place], [address] Phone: [phone]   Fax:  [phone]  Name: Allison Rollins MRN: [id number] Date of Birth: 1950-08-21

## 2017-04-08 ENCOUNTER — Ambulatory Visit: Payer: Medicare HMO | Admitting: Pediatrics

## 2017-04-14 ENCOUNTER — Ambulatory Visit: Payer: Medicare HMO | Admitting: *Deleted

## 2017-04-14 DIAGNOSIS — M6281 Muscle weakness (generalized): Secondary | ICD-10-CM

## 2017-04-14 DIAGNOSIS — R293 Abnormal posture: Secondary | ICD-10-CM | POA: Diagnosis not present

## 2017-04-14 DIAGNOSIS — M5441 Lumbago with sciatica, right side: Principal | ICD-10-CM

## 2017-04-14 DIAGNOSIS — G8929 Other chronic pain: Secondary | ICD-10-CM | POA: Diagnosis not present

## 2017-04-14 NOTE — Therapy (Signed)
[facility] [address] [place], [place], [address] Phone: [phone]   Fax:  [phone]  Physical Therapy Treatment  Patient Details  Name: Allison Rollins MRN: [id number] Date of Birth: 1950-09-14 Referring Provider: Julio SicksHenry Pool MD.   Encounter Date: 04/14/2017  PT End of Session - 04/14/17 1512    Visit Number  3    Number of Visits  16    Date for PT Re-Evaluation  05/30/17    PT Start Time  1430    PT Stop Time  1520    PT Time Calculation (min)  50 min       Past Medical History:  Diagnosis Date  . Anxiety   . Asthma   . CHF (congestive heart failure) (HCC)   . COPD (chronic obstructive pulmonary disease) (HCC)   . Depression   . Diabetes mellitus without complication (HCC)   . HTN (hypertension)   . Hypoglycemia   . Kidney stones   . Parkinson's disease   . Pneumonia   . S/P colonoscopy 10/15/04   normal    Past Surgical History:  Procedure Laterality Date  . CHOLECYSTECTOMY    . COLONOSCOPY  10/15/2004   Normal rectum/Normal colon  . COLONOSCOPY N/A 05/10/2016   Procedure: COLONOSCOPY;  Surgeon: West BaliSandi L Fields, MD;  [facility];  Service: Endoscopy;  Laterality: N/A;  10:30  . ESOPHAGOGASTRODUODENOSCOPY  09/11/2010   Dysphagia likely multifactorial (possible Candida esophagitis, likely nonspecific esophageal motility disorder, and/or uncontrolled gastroesophageal reflux disease), status post empiric dilation  . KIDNEY STONE SURGERY      There were no vitals filed for this visit.  Subjective Assessment - 04/14/17 1432    Subjective  That treatment helped a lot.    Pertinent History  Parkinson.    Limitations  Standing    How long can you stand comfortably?  10-15 minutes.    Diagnostic tests  MRI:  L4-5 moderate stenosis and disc bulging; bilateral L5 impingement.  X-ray:  Moderate to severe L4 to S1 facet arthropathy.    Patient Stated Goals  Stand staright and get around with less pain.    Pain Score  4     Pain Location  Back    Pain Orientation  Right;Left    Pain Onset  More than a month ago                      [facility] Adult PT Treatment/Exercise - 04/14/17 0001      Moist Heat Therapy   Number Minutes Moist Heat  15 Minutes    Moist Heat Location  Lumbar Spine      Electrical Stimulation   Electrical Stimulation Location  Low back.  IFC x 20 mins    Electrical Stimulation Goals  Tone;Pain      Ultrasound   Ultrasound Location  RT side LB    Ultrasound Parameters  1.5 w/cm2 x10 mins seated    Ultrasound Goals  Pain      Manual Therapy   Manual Therapy  Soft tissue mobilization    Manual therapy comments  In seated position with patient resting upper body on 2 pillows on plinth:  STW/M to lumbar erector spinae musculature with bilateral QL release x 13 minutes.               PT Short Term Goals - 03/31/17 1542      PT SHORT TERM GOAL #1   Title  STG's=LTG's.  PT Long Term Goals - 03/31/17 1543      PT LONG TERM GOAL #1   Title  Independent with a HEP.    Time  8    Period  Weeks    Status  New      PT LONG TERM GOAL #2   Title  Stand 20 minutes with pain not > 3/10.    Time  8    Period  Weeks    Status  New      PT LONG TERM GOAL #3   Title  Perform ADL's with pain not > 4/10.    Time  8    Period  Weeks    Status  New      PT LONG TERM GOAL #4   Title  Patient able to stand straight while walking.    Time  8    Period  Weeks    Status  New      PT LONG TERM GOAL #5   Title  Eliminate right LE symptoms.    Time  8    Period  Weeks    Status  New            Plan - 04/14/17 1515    Clinical Impression Statement  Pt returns to clinic reporting that the Rxs are helping and that she can go up/down stairs now without that sharp pain. She did well with Rx today and continues to progress with decreased pain.    Rehab Potential  Good    PT Frequency  2x / week    PT Duration  8 weeks    PT  Treatment/Interventions  ADLs/Self Care Home Management;Cryotherapy;Electrical Stimulation;Ultrasound;Moist Heat;Functional mobility training;Therapeutic activities;Therapeutic exercise;Patient/family education;Manual techniques;Passive range of motion;Dry needling    PT Next Visit Plan  Lumbar extension and left sidebending to improve posture.  Patient would benefit from U/S and STW/M to reduce spinal musculature tone and tightness.  HMP and eletrical stimulation    Consulted and Agree with Plan of Care  Patient       Patient will benefit from skilled therapeutic intervention in order to improve the following deficits and impairments:  Abnormal gait, Decreased activity tolerance, Decreased range of motion, Decreased strength, Hypomobility, Impaired tone, Postural dysfunction, Pain  Visit Diagnosis: Chronic midline low back pain with right-sided sciatica  Abnormal posture  Muscle weakness (generalized)     Problem List Patient Active Problem List   Diagnosis Date Noted  . Osteopenia of forearm 03/04/2017  . Depression 12/26/2015  . Chest pain 12/22/2015  . Generalized anxiety disorder 03/12/2015  . Prediabetes 03/12/2015  . Back pain 03/12/2015  . Stress incontinence 01/06/2015  . Chronic diastolic heart failure (HCC) 01/03/2015  . Hypokalemia 07/28/2013  . Obesity 07/01/2011  . Benign hypertension 07/01/2011  . Hyperlipidemia 07/01/2011  . Dysphagia 09/08/2010  . GERD (gastroesophageal reflux disease) 09/08/2010    RAMSEUR,CHRIS, PTA 04/14/2017, 3:40 PM  [facility] [address] [place], [place], [address] Phone: [phone]   Fax:  [phone]  Name: Allison Rollins MRN: [id number] Date of Birth: 1951-01-21

## 2017-04-21 ENCOUNTER — Ambulatory Visit: Payer: Medicare HMO | Admitting: Physical Therapy

## 2017-04-21 ENCOUNTER — Encounter: Payer: Self-pay | Admitting: Physical Therapy

## 2017-04-21 DIAGNOSIS — M6281 Muscle weakness (generalized): Secondary | ICD-10-CM | POA: Diagnosis not present

## 2017-04-21 DIAGNOSIS — M5441 Lumbago with sciatica, right side: Secondary | ICD-10-CM | POA: Diagnosis not present

## 2017-04-21 DIAGNOSIS — R293 Abnormal posture: Secondary | ICD-10-CM | POA: Diagnosis not present

## 2017-04-21 DIAGNOSIS — G8929 Other chronic pain: Secondary | ICD-10-CM

## 2017-04-21 NOTE — Therapy (Signed)
[facility] [address] [place], [place], [address] Phone: [phone]   Fax:  [phone]  Physical Therapy Treatment  Patient Details  Name: Allison Rollins MRN: [id number] Date of Birth: [birth year] Referring Provider: Julio Sicks MD.   Encounter Date: 04/21/2017  PT End of Session - 04/21/17 1500    Visit Number  4    Number of Visits  16    Date for PT Re-Evaluation  05/30/17    PT Start Time  1429    PT Stop Time  1514    PT Time Calculation (min)  45 min    Activity Tolerance  Patient tolerated treatment well    Behavior During Therapy  [facility] for tasks assessed/performed       Past Medical History:  Diagnosis Date  . Anxiety   . Asthma   . CHF (congestive heart failure) (HCC)   . COPD (chronic obstructive pulmonary disease) (HCC)   . Depression   . Diabetes mellitus without complication (HCC)   . HTN (hypertension)   . Hypoglycemia   . Kidney stones   . Parkinson's disease   . Pneumonia   . S/P colonoscopy 10/15/04   normal    Past Surgical History:  Procedure Laterality Date  . CHOLECYSTECTOMY    . COLONOSCOPY  10/15/2004   Normal rectum/Normal colon  . COLONOSCOPY N/A 05/10/2016   Procedure: COLONOSCOPY;  Surgeon: West Bali, MD;  [facility];  Service: Endoscopy;  Laterality: N/A;  10:30  . ESOPHAGOGASTRODUODENOSCOPY  09/11/2010   Dysphagia likely multifactorial (possible Candida esophagitis, likely nonspecific esophageal motility disorder, and/or uncontrolled gastroesophageal reflux disease), status post empiric dilation  . KIDNEY STONE SURGERY      There were no vitals filed for this visit.  Subjective Assessment - 04/21/17 1432    Subjective  Patient reported relief after last treatment    Pertinent History  Parkinson.    Limitations  Standing    How long can you stand comfortably?  10-15 minutes.    Diagnostic tests  MRI:  L4-5 moderate stenosis and disc bulging; bilateral L5 impingement.   X-ray:  Moderate to severe L4 to S1 facet arthropathy.    Patient Stated Goals  Stand staright and get around with less pain.    Currently in Pain?  Yes    Pain Score  4     Pain Location  Back    Pain Descriptors / Indicators  Discomfort;Sore    Pain Type  Chronic pain    Pain Onset  More than a month ago    Pain Frequency  Constant    Aggravating Factors   increased activity    Pain Relieving Factors  rest                      [facility] Adult PT Treatment/Exercise - 04/21/17 0001      Moist Heat Therapy   Number Minutes Moist Heat  15 Minutes    Moist Heat Location  Lumbar Spine      Electrical Stimulation   Electrical Stimulation Location  Low back.  IFC x 15 mins    Electrical Stimulation Goals  Tone;Pain      Ultrasound   Ultrasound Location  right low back     Ultrasound Parameters  1.5w/cm2/50%/40mhz x101min    Ultrasound Goals  Pain      Manual Therapy   Manual Therapy  Soft tissue mobilization    Manual therapy comments  In  seated position with patient resting upper body on 2 pillows on plinth:  STW/M to lumbar erector spinae musculature right side, trigger point release to QL             PT Education - 04/21/17 1459    Education provided  Yes    Education Details  posture awareness techniques    Person(s) Educated  Patient    Methods  Explanation;Demonstration;Handout    Comprehension  Verbalized understanding;Returned demonstration       PT Short Term Goals - 03/31/17 1542      PT SHORT TERM GOAL #1   Title  STG's=LTG's.        PT Long Term Goals - 03/31/17 1543      PT LONG TERM GOAL #1   Title  Independent with a HEP.    Time  8    Period  Weeks    Status  New      PT LONG TERM GOAL #2   Title  Stand 20 minutes with pain not > 3/10.    Time  8    Period  Weeks    Status  New      PT LONG TERM GOAL #3   Title  Perform ADL's with pain not > 4/10.    Time  8    Period  Weeks    Status  New      PT LONG TERM GOAL #4    Title  Patient able to stand straight while walking.    Time  8    Period  Weeks    Status  New      PT LONG TERM GOAL #5   Title  Eliminate right LE symptoms.    Time  8    Period  Weeks    Status  New            Plan - 04/21/17 1501    Clinical Impression Statement  Patient tolerated treatment well today and has reported relief and increased activity tolerance at home. Patient able get around house and in/out of car with greater ease. Educated patient on posture awareness techniques with HEP provided. Goals ongoing due to pai limitations.    Rehab Potential  Good    PT Frequency  2x / week    PT Duration  8 weeks    PT Treatment/Interventions  ADLs/Self Care Home Management;Cryotherapy;Electrical Stimulation;Ultrasound;Moist Heat;Functional mobility training;Therapeutic activities;Therapeutic exercise;Patient/family education;Manual techniques;Passive range of motion;Dry needling    PT Next Visit Plan  Lumbar extension and left sidebending to improve posture.  Patient would benefit from U/S and STW/M to reduce spinal musculature tone and tightness.  HMP and eletrical stimulation    Consulted and Agree with Plan of Care  Patient       Patient will benefit from skilled therapeutic intervention in order to improve the following deficits and impairments:  Abnormal gait, Decreased activity tolerance, Decreased range of motion, Decreased strength, Hypomobility, Impaired tone, Postural dysfunction, Pain  Visit Diagnosis: Chronic midline low back pain with right-sided sciatica  Abnormal posture  Muscle weakness (generalized)     Problem List Patient Active Problem List   Diagnosis Date Noted  . Osteopenia of forearm 03/04/2017  . Depression 12/26/2015  . Chest pain 12/22/2015  . Generalized anxiety disorder 03/12/2015  . Prediabetes 03/12/2015  . Back pain 03/12/2015  . Stress incontinence 01/06/2015  . Chronic diastolic heart failure (HCC) 01/03/2015  . Hypokalemia  07/28/2013  . Obesity 07/01/2011  . Benign hypertension  07/01/2011  . Hyperlipidemia 07/01/2011  . Dysphagia 09/08/2010  . GERD (gastroesophageal reflux disease) 09/08/2010    Britiany Silbernagel P, PTA 04/21/2017, 3:15 PM  [facility] [address] [place], [place], [address] Phone: [phone]   Fax:  [phone]  Name: Allison Rollins MRN: [id number] Date of Birth: 08-17-1950

## 2017-04-21 NOTE — Patient Instructions (Signed)

## 2017-04-27 ENCOUNTER — Encounter: Payer: Self-pay | Admitting: Pediatrics

## 2017-04-27 ENCOUNTER — Ambulatory Visit (INDEPENDENT_AMBULATORY_CARE_PROVIDER_SITE_OTHER): Payer: Medicare HMO | Admitting: Pediatrics

## 2017-04-27 VITALS — BP 133/86 | HR 66 | Temp 97.8°F | Ht 66.0 in | Wt 242.0 lb

## 2017-04-27 DIAGNOSIS — M545 Low back pain, unspecified: Secondary | ICD-10-CM

## 2017-04-27 DIAGNOSIS — E785 Hyperlipidemia, unspecified: Secondary | ICD-10-CM

## 2017-04-27 DIAGNOSIS — G8929 Other chronic pain: Secondary | ICD-10-CM

## 2017-04-27 DIAGNOSIS — F339 Major depressive disorder, recurrent, unspecified: Secondary | ICD-10-CM

## 2017-04-27 DIAGNOSIS — R32 Unspecified urinary incontinence: Secondary | ICD-10-CM | POA: Diagnosis not present

## 2017-04-27 DIAGNOSIS — G629 Polyneuropathy, unspecified: Secondary | ICD-10-CM

## 2017-04-27 MED ORDER — FUROSEMIDE 20 MG PO TABS: 20.0000 mg | ORAL_TABLET | Freq: Every day | ORAL | 3 refills | Status: DC | PRN

## 2017-04-27 NOTE — Progress Notes (Signed)
  Subjective:   Patient ID: Allison Rollins, female    DOB: 15-Jun-1950, 67 y.o.   MRN: [id number] CC: Follow-up (2 month) Multiple medical problems HPI: Allison Rollins is a 67 y.o. female presenting for Follow-up (2 month)  Has been in physical therapy for back pain Has been helping a lot Pleased with decreased pain with stepping up stairs, getting into bed No numbness or tingling in her toes  HTN: taking meds regualrly, no s/e  Mood: has been good  Neuropathy: burning to in toes at night Gabapentin has been helping some  Relevant past medical, surgical, family and social history reviewed. Allergies and medications reviewed and updated. Social History   Tobacco Use  Smoking Status Former Smoker  . Packs/day: 0.25  . Years: 30.00  . Pack years: 7.50  . Types: Cigarettes  . Start date: 01/04/1965  . Last attempt to quit: 01/03/1995  . Years since quitting: 22.3  Smokeless Tobacco Never Used   ROS: Per HPI   Objective:    BP 133/86   Pulse 66   Temp 97.8 F (36.6 C) (Oral)   Ht 5\' 6"  (1.676 m)   Wt 242 lb (109.8 kg)   BMI 39.06 kg/m   Wt Readings from Last 3 Encounters:  04/27/17 242 lb (109.8 kg)  03/04/17 236 lb (107 kg)  02/21/17 237 lb 3.2 oz (107.6 kg)    Gen: NAD, alert, cooperative with exam, NCAT EYES: EOMI, no conjunctival injection, or no icterus ENT: OP without erythema LYMPH: no cervical LAD CV: NRRR, normal S1/S2, no murmur, distal pulses 2+ b/l Resp: CTABL, no wheezes, normal WOB Abd: +BS, soft, NTND. no guarding or organomegaly Ext: 1+ edema, warm Neuro: Alert and oriented, strength equal b/l UE and LE, coordination grossly normal MSK: normal muscle bulk  Assessment & Plan:  Allison Rollins was seen today for follow-up multiple medical problems  Diagnoses and all orders for this visit:  Neuropathy Improved with gabapentin, continue  Recurrent major depressive disorder, remission status unspecified (HCC) Mood has been better on the sertraline,  continue  Hyperlipidemia, unspecified hyperlipidemia type Stable, continue statin  Chronic bilateral low back pain without sciatica Improving with physical therapy, continue  Urinary incontinence, unspecified type Discussed options, patient wants to wait to start any medication right now Will do timed voiding, minimize fluids after dinner  Swelling Take Lasix once a day as needed Let me know if not improving -     furosemide (LASIX) 20 MG tablet; Take 1 tablet (20 mg total) by mouth daily as needed.   Follow up plan: Return in about 3 months (around 07/26/2017). Rex Krasarol Vincent, MD [facility]

## 2017-05-02 DIAGNOSIS — Z1231 Encounter for screening mammogram for malignant neoplasm of breast: Secondary | ICD-10-CM | POA: Diagnosis not present

## 2017-05-04 ENCOUNTER — Other Ambulatory Visit: Payer: Self-pay | Admitting: *Deleted

## 2017-05-04 MED ORDER — GLUCOSE BLOOD VI STRP: ORAL_STRIP | 2 refills | Status: DC

## 2017-05-05 ENCOUNTER — Encounter: Payer: Self-pay | Admitting: Physical Therapy

## 2017-05-05 ENCOUNTER — Ambulatory Visit: Payer: Medicare HMO | Attending: Neurosurgery | Admitting: Physical Therapy

## 2017-05-05 DIAGNOSIS — R293 Abnormal posture: Secondary | ICD-10-CM

## 2017-05-05 DIAGNOSIS — M5441 Lumbago with sciatica, right side: Secondary | ICD-10-CM | POA: Insufficient documentation

## 2017-05-05 DIAGNOSIS — G8929 Other chronic pain: Secondary | ICD-10-CM

## 2017-05-05 DIAGNOSIS — M6281 Muscle weakness (generalized): Secondary | ICD-10-CM | POA: Diagnosis not present

## 2017-05-05 NOTE — Therapy (Addendum)
[facility] [place], [place], [address] Phone: [phone]   Fax:  [phone]  Physical Therapy Treatment  Patient Details  Name: Allison Rollins MRN: [id number] Date of Birth: [birth year] Referring Provider: Earnie Larsson MD.   Encounter Date: 05/05/2017  PT End of Session - 05/05/17 1500    Visit Number  5    Number of Visits  16    Date for PT Re-Evaluation  05/30/17    PT Start Time  1427    PT Stop Time  1514    PT Time Calculation (min)  47 min    Activity Tolerance  Patient tolerated treatment well    Behavior During Therapy  [facility] for tasks assessed/performed       Past Medical History:  Diagnosis Date  . Anxiety   . Asthma   . CHF (congestive heart failure) ([place])   . COPD (chronic obstructive pulmonary disease) ([place])   . Depression   . Diabetes mellitus without complication ([place])   . HTN (hypertension)   . Hypoglycemia   . Kidney stones   . Parkinson's disease   . Pneumonia   . S/P colonoscopy 10/15/04   normal    Past Surgical History:  Procedure Laterality Date  . CHOLECYSTECTOMY    . COLONOSCOPY  10/15/2004   Normal rectum/Normal colon  . COLONOSCOPY N/A 05/10/2016   Procedure: COLONOSCOPY;  Surgeon: Danie Binder, MD;  [facility];  Service: Endoscopy;  Laterality: N/A;  10:30  . ESOPHAGOGASTRODUODENOSCOPY  09/11/2010   Dysphagia likely multifactorial (possible Candida esophagitis, likely nonspecific esophageal motility disorder, and/or uncontrolled gastroesophageal reflux disease), status post empiric dilation  . KIDNEY STONE SURGERY      There were no vitals filed for this visit.  Subjective Assessment - 05/05/17 1431    Subjective  Patient reported some ongoing discomfort and did well after last treatment    Pertinent History  Parkinson.    Limitations  Standing    How long can you stand comfortably?  10-15 minutes.    Diagnostic tests  MRI:  L4-5 moderate stenosis and disc  bulging; bilateral L5 impingement.  X-ray:  Moderate to severe L4 to S1 facet arthropathy.    Patient Stated Goals  Stand staright and get around with less pain.    Currently in Pain?  Yes    Pain Score  7     Pain Location  Back    Pain Orientation  Right;Left;Mid    Pain Descriptors / Indicators  Discomfort    Pain Type  Chronic pain    Pain Onset  More than a month ago    Pain Frequency  Constant    Aggravating Factors   increased activity    Pain Relieving Factors  rest                      [facility] Adult PT Treatment/Exercise - 05/05/17 0001      Moist Heat Therapy   Number Minutes Moist Heat  15 Minutes    Moist Heat Location  Lumbar Spine      Electrical Stimulation   Electrical Stimulation Location  Low back.  IFC x 15 mins    Electrical Stimulation Goals  Tone;Pain      Ultrasound   Ultrasound Location  bil mid back paraspinals    Ultrasound Parameters  1.5w/cm2/50%/3mz x11m    Ultrasound Goals  Pain      Manual Therapy   Manual Therapy  Soft tissue mobilization    Manual therapy comments  In seated position with patient resting upper body on 2 pillows on plinth:  STW/M to lumbar erector spinae musculature/mid back and trigger point release                PT Short Term Goals - 03/31/17 1542      PT SHORT TERM GOAL #1   Title  STG's=LTG's.        PT Long Term Goals - 05/05/17 1501      PT LONG TERM GOAL #1   Title  Independent with a HEP.    Time  8    Period  Weeks    Status  On-going      PT LONG TERM GOAL #2   Title  Stand 20 minutes with pain not > 3/10.    Time  8    Period  Weeks    Status  On-going      PT LONG TERM GOAL #3   Title  Perform ADL's with pain not > 4/10.    Time  8    Period  Weeks    Status  On-going      PT LONG TERM GOAL #4   Title  Patient able to stand straight while walking.    Time  8    Period  Weeks    Status  On-going      PT LONG TERM GOAL #5   Title  Eliminate right LE symptoms.     Time  8    Period  Weeks    Status  On-going            Plan - 05/05/17 1502    Clinical Impression Statement  Patient tolerated treatment well today. Patient reported being more aware of posture and practicing techniques as given on HEP. Today educated patient on ongoing poture techniques and upright position to increase functional independence. Paient continues to report relief after therapy. Goals ongoing at this time.     Rehab Potential  Good    PT Frequency  2x / week    PT Duration  8 weeks    PT Treatment/Interventions  ADLs/Self Care Home Management;Cryotherapy;Electrical Stimulation;Ultrasound;Moist Heat;Functional mobility training;Therapeutic activities;Therapeutic exercise;Patient/family education;Manual techniques;Passive range of motion;Dry needling    PT Next Visit Plan  patient may be on hold due to high co-pay and se how she feels next week or two    Consulted and Agree with Plan of Care  Patient       Patient will benefit from skilled therapeutic intervention in order to improve the following deficits and impairments:  Abnormal gait, Decreased activity tolerance, Decreased range of motion, Decreased strength, Hypomobility, Impaired tone, Postural dysfunction, Pain  Visit Diagnosis: Chronic midline low back pain with right-sided sciatica  Abnormal posture  Muscle weakness (generalized)     Problem List Patient Active Problem List   Diagnosis Date Noted  . Osteopenia of forearm 03/04/2017  . Depression 12/26/2015  . Chest pain 12/22/2015  . Generalized anxiety disorder 03/12/2015  . Prediabetes 03/12/2015  . Back pain 03/12/2015  . Stress incontinence 01/06/2015  . Chronic diastolic heart failure ([place]) 01/03/2015  . Hypokalemia 07/28/2013  . Obesity 07/01/2011  . Benign hypertension 07/01/2011  . Hyperlipidemia 07/01/2011  . Dysphagia 09/08/2010  . GERD (gastroesophageal reflux disease) 09/08/2010    Eligah Anello P 05/05/2017, 3:17  PM  [facility] [address] [place], [place], [address] Phone: [phone]  Fax:  [phone]  Name: Allison Rollins MRN: [id number] Date of Birth: [birth year]  PHYSICAL THERAPY DISCHARGE SUMMARY  Visits from Start of Care: 5.  Current functional level related to goals / functional outcomes: See above.   Remaining deficits: See below.   Education / Equipment: HEP. Plan: Patient agrees to discharge.  Patient goals were not met. Patient is being discharged due to not returning since the last visit.  ?????         Mali Applegate MPT

## 2017-05-16 ENCOUNTER — Other Ambulatory Visit: Payer: Self-pay | Admitting: Pediatrics

## 2017-05-16 DIAGNOSIS — E559 Vitamin D deficiency, unspecified: Secondary | ICD-10-CM

## 2017-05-19 ENCOUNTER — Encounter: Payer: Medicare HMO | Admitting: Physical Therapy

## 2017-05-25 ENCOUNTER — Encounter: Payer: Medicare HMO | Admitting: Physical Therapy

## 2017-05-31 ENCOUNTER — Encounter: Payer: Medicare HMO | Admitting: Physical Therapy

## 2017-06-03 ENCOUNTER — Other Ambulatory Visit: Payer: Self-pay | Admitting: Pediatrics

## 2017-06-03 DIAGNOSIS — R7303 Prediabetes: Secondary | ICD-10-CM

## 2017-06-03 DIAGNOSIS — E559 Vitamin D deficiency, unspecified: Secondary | ICD-10-CM

## 2017-06-03 DIAGNOSIS — F339 Major depressive disorder, recurrent, unspecified: Secondary | ICD-10-CM

## 2017-06-03 DIAGNOSIS — K219 Gastro-esophageal reflux disease without esophagitis: Secondary | ICD-10-CM

## 2017-07-11 ENCOUNTER — Encounter: Payer: Self-pay | Admitting: Family Medicine

## 2017-07-11 ENCOUNTER — Ambulatory Visit (INDEPENDENT_AMBULATORY_CARE_PROVIDER_SITE_OTHER): Payer: Medicare HMO | Admitting: Family Medicine

## 2017-07-11 VITALS — BP 160/86 | HR 56 | Temp 97.2°F | Ht 65.0 in | Wt 246.0 lb

## 2017-07-11 DIAGNOSIS — J9801 Acute bronchospasm: Secondary | ICD-10-CM

## 2017-07-11 DIAGNOSIS — Z6841 Body Mass Index (BMI) 40.0 and over, adult: Secondary | ICD-10-CM | POA: Diagnosis not present

## 2017-07-11 DIAGNOSIS — J449 Chronic obstructive pulmonary disease, unspecified: Secondary | ICD-10-CM | POA: Diagnosis not present

## 2017-07-11 DIAGNOSIS — G20 Parkinson's disease: Secondary | ICD-10-CM | POA: Diagnosis not present

## 2017-07-11 DIAGNOSIS — J069 Acute upper respiratory infection, unspecified: Secondary | ICD-10-CM | POA: Diagnosis not present

## 2017-07-11 MED ORDER — METHYLPREDNISOLONE ACETATE 80 MG/ML IJ SUSP
40.0000 mg | Freq: Once | INTRAMUSCULAR | Status: AC
  Administered 2017-07-11: 40 mg via INTRAMUSCULAR

## 2017-07-11 NOTE — Patient Instructions (Signed)
As we discussed, some of the shortness of breath with exertion may be related to your fluid status.  Your legs did look like you were a little bit swollen today.  Your lungs did not demonstrate any fluid back up into the lungs though.  You have had about a 4 pound weight gain since her last visit.  Try using the furosemide for at least the next day or 2 and see if this improves your breathing.  Monitor your weight.  We discussed using Vicks VapoRub and Afrin nasal spray if needed for sinus congestion.  You may continue the Mucinex if you find this helpful.  Alternative medications are listed below.  It appears that you have a viral upper respiratory infection (cold).  Cold symptoms can last up to 2 weeks.  I recommend that you only use cold medications that are safe in high blood pressure like Coricidin (generic is fine).  Other cold medications can increase your blood pressure.    - Get plenty of rest and drink plenty of fluids. - Try to breathe moist air. Use a cold mist humidifier. - Consume warm fluids (soup or tea) to provide relief for a stuffy nose and to loosen phlegm. - For nasal stuffiness, try saline nasal spray or a Neti Pot.  Afrin nasal spray can also be used but this product should not be used longer than 3 days or it will cause rebound nasal stuffiness (worsening nasal congestion). - For sore throat pain relief: suck on throat lozenges, hard candy or popsicles; gargle with warm salt water (1/4 tsp. salt per 8 oz. of water); and eat soft, bland foods. - Eat a well-balanced diet. If you cannot, ensure you are getting enough nutrients by taking a daily multivitamin. - Avoid dairy products, as they can thicken phlegm. - Avoid alcohol, as it impairs your body's immune system.  CONTACT YOUR DOCTOR IF YOU EXPERIENCE ANY OF THE FOLLOWING: - High fever - Ear pain - Sinus-type headache - Unusually severe cold symptoms - Cough that gets worse while other cold symptoms improve - Flare up of  any chronic lung problem, such as asthma - Your symptoms persist longer than 2 weeks

## 2017-07-11 NOTE — Progress Notes (Signed)
 Subjective: CC: URI PCP: Vincent, Carol L, MD HPI:Allison Rollins is a 67 y.o. female presenting to clinic today for:  1. Cold symptoms  Patient reports sinus congestion, decreased appetite and dyspnea on exertion for the last 4 days.  She reports a wheeze at nighttime.  She notes a productive cough without hemoptysis.  Denies fevers.  She started using Mucinex last night with good relief of symptoms.  She is also been using Vicks VapoRub for sinus decongestant with good relief.  Past medical history is significant for heart failure.  She notes that she has not been checking daily weights.  She has not used Lasix in a while.  At baseline, she reports she is unable to lie flat for sleep.  ROS: Per HPI  Allergies  Allergen Reactions  . Penicillins Itching and Rash    Has patient had a PCN reaction causing immediate rash, facial/tongue/throat swelling, SOB or lightheadedness with hypotension: no Has patient had a PCN reaction causing severe rash involving mucus membranes or skin necrosis: no Has patient had a PCN reaction that required hospitalization: no Has patient had a PCN reaction occurring within the last 10 years: no If all of the above answers are "NO", then may proceed with Cephalosporin use.   . Sulfa Antibiotics Rash   Past Medical History:  Diagnosis Date  . Anxiety   . Asthma   . CHF (congestive heart failure) (HCC)   . COPD (chronic obstructive pulmonary disease) (HCC)   . Depression   . Diabetes mellitus without complication (HCC)   . HTN (hypertension)   . Hypoglycemia   . Kidney stones   . Parkinson's disease   . Pneumonia   . S/P colonoscopy 10/15/04   normal    Current Outpatient Medications:  .  acetaminophen (TYLENOL) 500 MG tablet, Take 500 mg by mouth every 6 (six) hours as needed for moderate pain. , Disp: , Rfl:  .  albuterol (PROVENTIL HFA;VENTOLIN HFA) 108 (90 BASE) MCG/ACT inhaler, Inhale 1-2 puffs into the lungs every 6 (six) hours as needed for  wheezing., Disp: 1 Inhaler, Rfl: 0 .  aspirin EC 81 MG tablet, Take 1 tablet (81 mg total) by mouth daily., Disp: , Rfl:  .  blood glucose meter kit and supplies, Accu-check Nano smartview meter, Disp: 1 each, Rfl: 0 .  Blood Glucose Monitoring Suppl (BLOOD GLUCOSE MONITOR SYSTEM) w/Device KIT, 1 Device by Does not apply route daily., Disp: 1 each, Rfl: 0 .  Blood Glucose Monitoring Suppl (TRUE METRIX AIR GLUCOSE METER) w/Device KIT, USE EVERY DAY AS DIRECTED, Disp: 1 kit, Rfl: 0 .  furosemide (LASIX) 20 MG tablet, Take 1 tablet (20 mg total) by mouth daily as needed., Disp: 30 tablet, Rfl: 3 .  gabapentin (NEURONTIN) 300 MG capsule, Take 1-2 capsules (300-600 mg total) by mouth 2 (two) times daily. Take 300mg in the morning, 600mg at night, Disp: 270 capsule, Rfl: 0 .  glucose blood (TRUE METRIX BLOOD GLUCOSE TEST) test strip, Use to check blood sugars daily, Disp: 100 each, Rfl: 2 .  lisinopril (PRINIVIL,ZESTRIL) 10 MG tablet, Take 1 tablet (10 mg total) by mouth daily., Disp: 90 tablet, Rfl: 0 .  metFORMIN (GLUCOPHAGE) 500 MG tablet, TAKE ONE TABLET BY MOUTH ONCE DAILY WITH BREAKFAST, Disp: 90 tablet, Rfl: 0 .  nitroGLYCERIN (NITROSTAT) 0.4 MG SL tablet, Place 1 tablet (0.4 mg total) under the tongue every 5 (five) minutes as needed for chest pain., Disp: 20 tablet, Rfl: 3 .  Omega-3   Fatty Acids (FISH OIL PO), Take 6 capsules by mouth daily. , Disp: , Rfl:  .  pantoprazole (PROTONIX) 40 MG tablet, TAKE 1 TABLET EVERY DAY, Disp: 90 tablet, Rfl: 0 .  pramipexole (MIRAPEX) 0.25 MG tablet, Take 0.5 tablets by mouth at bedtime., Disp: , Rfl:  .  pravastatin (PRAVACHOL) 40 MG tablet, Take 1 tablet (40 mg total) by mouth at bedtime., Disp: 90 tablet, Rfl: 3 .  sertraline (ZOLOFT) 50 MG tablet, TAKE 1 TABLET (50 MG TOTAL) DAILY BY MOUTH., Disp: 90 tablet, Rfl: 0 .  Vitamin D, Ergocalciferol, (DRISDOL) 50000 units CAPS capsule, TAKE 1 CAPSULE EVERY THURSDAY., Disp: 12 capsule, Rfl: 0 Social History    Socioeconomic History  . Marital status: Widowed    Spouse name: Not on file  . Number of children: 2  . Years of education: Not on file  . Highest education level: Not on file  Social Needs  . Financial resource strain: Not on file  . Food insecurity - worry: Not on file  . Food insecurity - inability: Not on file  . Transportation needs - medical: Not on file  . Transportation needs - non-medical: Not on file  Occupational History  . Occupation: disabled  Tobacco Use  . Smoking status: Former Smoker    Packs/day: 0.25    Years: 30.00    Pack years: 7.50    Types: Cigarettes    Start date: 01/04/1965    Last attempt to quit: 01/03/1995    Years since quitting: 22.5  . Smokeless tobacco: Never Used  Substance and Sexual Activity  . Alcohol use: No    Alcohol/week: 0.0 oz  . Drug use: No  . Sexual activity: No  Other Topics Concern  . Not on file  Social History Narrative  . Not on file   Family History  Problem Relation Age of Onset  . Coronary artery disease Father   . COPD Mother   . Asthma Brother   . Coronary artery disease Sister   . Diabetes Sister   . Coronary artery disease Brother   . Coronary artery disease Sister     Objective: Office vital signs reviewed. BP (!) 160/86   Pulse (!) 56   Temp (!) 97.2 F (36.2 C) (Oral)   Ht 5' 5" (1.651 m)   Wt 246 lb (111.6 kg)   SpO2 95%   BMI 40.94 kg/m   Physical Examination:  General: Awake, alert, obese, No acute distress HEENT: Normal    Neck: No masses palpated. No lymphadenopathy; no JVD    Ears: Tympanic membranes intact, normal light reflex, no erythema, no bulging    Eyes: PERRLA, extraocular membranes intact, sclera white    Nose: nasal turbinates moist, clear nasal discharge    Throat: moist mucus membranes, no erythema, no tonsillar exudate.  Airway is patent Cardio: slow rate and rhythm, S1S2 heard, occasional extra beat noted.  No murmurs appreciated Pulm: clear to auscultation  bilaterally, no wheezes, rhonchi or rales; normal work of breathing on room air Extremities: warm, +1 pitting edema to mid shins.  Assessment/ Plan: 67 y.o. female   1. URI with cough and congestion Patient is afebrile and nontoxic-appearing.  Her pulse ox was within normal limits on room air.  Her cardiopulmonary exam was remarkable for bradycardia, which appears to be chronic for patient.  Likely has a viral URI.  Possibly having a component of bronchospasm.  For this reason she was given a dose of Depo-Medrol here in office.  Dyspnea on exertion may be related to viral URI.  However, she does demonstrate +1 pitting edema to mid shins bilaterally on exam.  No JVD or crackles on pulmonary exam.  I did recommend that since she has had about a 4 pound weight gain since last visit that she start using furosemide for at least the next 2 days.  Monitor weights daily.  Follow-up if no improvement in symptoms or worsening of symptoms.  She also has an albuterol inhaler, which I encouraged her to use for the next day or 2.  May continue supportive care at home with OTC medications that she is finding these to be helpful. - methylPREDNISolone acetate (DEPO-MEDROL) injection 40 mg  2. Bronchospasm - methylPREDNISolone acetate (DEPO-MEDROL) injection 40 mg  Meds ordered this encounter  Medications  . methylPREDNISolone acetate (DEPO-MEDROL) injection 40 mg      M , DO [facility] [phone]   

## 2017-07-13 ENCOUNTER — Telehealth: Payer: Self-pay | Admitting: Pediatrics

## 2017-07-13 NOTE — Telephone Encounter (Signed)
Pt is concerned about her BP as her readings are up and down she checked her BP 6 times in 10 minutes and the readings were  121/69 111/60 127/74 110/67 136/85 146/58  Pt states she was just sitting down and wasn't feeling well so she took her BP. She wanted to make sure her BP was ok since she got the steroid shot the other day. Advised pt the steroid shot shouldn't effect her BP as much as the oral prednisone but could possibly cause her blood sugars to go up. Pt voiced understanding and will monitor.

## 2017-07-13 NOTE — Telephone Encounter (Signed)
Closing encounter addressed in another TC from today

## 2017-07-28 ENCOUNTER — Encounter: Payer: Self-pay | Admitting: Pediatrics

## 2017-07-28 ENCOUNTER — Ambulatory Visit (INDEPENDENT_AMBULATORY_CARE_PROVIDER_SITE_OTHER): Payer: Medicare HMO | Admitting: Pediatrics

## 2017-07-28 VITALS — BP 138/82 | HR 68 | Temp 97.4°F | Ht 65.0 in | Wt 244.4 lb

## 2017-07-28 DIAGNOSIS — G629 Polyneuropathy, unspecified: Secondary | ICD-10-CM | POA: Diagnosis not present

## 2017-07-28 DIAGNOSIS — E1142 Type 2 diabetes mellitus with diabetic polyneuropathy: Secondary | ICD-10-CM

## 2017-07-28 DIAGNOSIS — I10 Essential (primary) hypertension: Secondary | ICD-10-CM

## 2017-07-28 DIAGNOSIS — R7303 Prediabetes: Secondary | ICD-10-CM | POA: Diagnosis not present

## 2017-07-28 DIAGNOSIS — E785 Hyperlipidemia, unspecified: Secondary | ICD-10-CM | POA: Diagnosis not present

## 2017-07-28 DIAGNOSIS — K219 Gastro-esophageal reflux disease without esophagitis: Secondary | ICD-10-CM | POA: Diagnosis not present

## 2017-07-28 DIAGNOSIS — F339 Major depressive disorder, recurrent, unspecified: Secondary | ICD-10-CM

## 2017-07-28 DIAGNOSIS — Z6841 Body Mass Index (BMI) 40.0 and over, adult: Secondary | ICD-10-CM | POA: Diagnosis not present

## 2017-07-28 LAB — BAYER DCA HB A1C WAIVED: HB A1C (BAYER DCA - WAIVED): 5.9 % (ref <7.0)

## 2017-07-28 MED ORDER — METFORMIN HCL 500 MG PO TABS: 500.0000 mg | ORAL_TABLET | Freq: Every day | ORAL | 1 refills | Status: DC

## 2017-07-28 MED ORDER — GABAPENTIN 300 MG PO CAPS: 300.0000 mg | ORAL_CAPSULE | Freq: Two times a day (BID) | ORAL | 1 refills | Status: DC

## 2017-07-28 MED ORDER — FUROSEMIDE 20 MG PO TABS: 20.0000 mg | ORAL_TABLET | Freq: Every day | ORAL | 1 refills | Status: DC | PRN

## 2017-07-28 MED ORDER — LISINOPRIL 10 MG PO TABS: 10.0000 mg | ORAL_TABLET | Freq: Every day | ORAL | 3 refills | Status: DC

## 2017-07-28 MED ORDER — PANTOPRAZOLE SODIUM 40 MG PO TBEC: 40.0000 mg | DELAYED_RELEASE_TABLET | Freq: Every day | ORAL | 3 refills | Status: DC

## 2017-07-28 MED ORDER — SERTRALINE HCL 50 MG PO TABS: ORAL_TABLET | ORAL | 1 refills | Status: DC

## 2017-07-28 NOTE — Progress Notes (Signed)
Subjective:   Patient ID: Allison Rollins, female    DOB: 06-05-1950, 67 y.o.   MRN: [id number] CC: Follow-up (3 month) and Leg Pain (Worse first thing in the Am, left)  HPI: Allison Rollins is a 67 y.o. female presenting for Follow-up (3 month) and Leg Pain (Worse first thing in the Am, left)  Leg pain: Points to the middle of her left thigh with where pain is.  Feels like a tingling at times.  Says is not bothering her all that much.  She notices it when she tries to stand up.  Sometimes with walking.  Putting some cream on it which helps.  No weakness in the leg, no knee pain or hip pain.  Diabetes: Taking metformin regularly.  Neuropathy: Taking gabapentin.  Depression: Mood is been good.  Relevant past medical, surgical, family and social history reviewed. Allergies and medications reviewed and updated. Social History   Tobacco Use  Smoking Status Former Smoker  . Packs/day: 0.25  . Years: 30.00  . Pack years: 7.50  . Types: Cigarettes  . Start date: 01/04/1965  . Last attempt to quit: 01/03/1995  . Years since quitting: 22.5  Smokeless Tobacco Never Used   ROS: Per HPI   Objective:    BP 138/82   Pulse 68   Temp (!) 97.4 F (36.3 C) (Oral)   Ht 5' 5"  (1.651 m)   Wt 244 lb 6.4 oz (110.9 kg)   BMI 40.67 kg/m   Wt Readings from Last 3 Encounters:  07/28/17 244 lb 6.4 oz (110.9 kg)  07/11/17 246 lb (111.6 kg)  04/27/17 242 lb (109.8 kg)    Gen: NAD, alert, cooperative with exam, NCAT EYES: EOMI, no conjunctival injection, or no icterus ENT:  OP without erythema CV: NRRR, normal S1/S2, no murmur, distal pulses 2+ b/l Resp: CTABL, no wheezes, normal WOB Abd: +BS, soft, NTND. no guarding or organomegaly Ext: No edema, warm Neuro: Alert and oriented, strength equal b/l UE and LE, coordination grossly normal MSK: No tenderness to palpation along left leg. Psych: Normal affect, no thoughts of self-harm.  Assessment & Plan:  Allison Rollins was seen today for follow-up and leg  pain.  Diagnoses and all orders for this visit:  Hyperlipidemia, unspecified hyperlipidemia type -     CMP14+EGFR -     Lipid panel  Prediabetes -     Bayer DCA Hb A1c Waived -     CMP14+EGFR -     metFORMIN (GLUCOPHAGE) 500 MG tablet; Take 1 tablet (500 mg total) by mouth daily with breakfast.  Type 2 diabetes mellitus with diabetic polyneuropathy, without long-term current use of insulin (HCC) -     Bayer DCA Hb A1c Waived -     CMP14+EGFR -     Lipid panel  Neuropathy -     gabapentin (NEURONTIN) 300 MG capsule; Take 1-2 capsules (300-600 mg total) by mouth 2 (two) times daily. Take 3109m in the morning, 60105mat night  Benign hypertension -     furosemide (LASIX) 20 MG tablet; Take 1 tablet (20 mg total) by mouth daily as needed. -     lisinopril (PRINIVIL,ZESTRIL) 10 MG tablet; Take 1 tablet (10 mg total) by mouth daily.  Gastroesophageal reflux disease, esophagitis presence not specified -     pantoprazole (PROTONIX) 40 MG tablet; Take 1 tablet (40 mg total) by mouth daily.  Recurrent major depressive disorder, remission status unspecified (HCC) -     sertraline (ZOLOFT) 50 MG tablet; TAKE  1 TABLET (50 MG TOTAL) DAILY BY MOUTH.   Follow up plan: Follow-up 6 months Assunta Found, MD [place]

## 2017-07-29 LAB — CMP14+EGFR
ALT: 11 IU/L (ref 0–32)
AST: 12 IU/L (ref 0–40)
Albumin/Globulin Ratio: 1.5 (ref 1.2–2.2)
Albumin: 3.9 g/dL (ref 3.6–4.8)
Alkaline Phosphatase: 98 IU/L (ref 39–117)
BUN/Creatinine Ratio: 14 (ref 12–28)
BUN: 13 mg/dL (ref 8–27)
Bilirubin Total: 0.4 mg/dL (ref 0.0–1.2)
CO2: 28 mmol/L (ref 20–29)
Calcium: 9.6 mg/dL (ref 8.7–10.3)
Chloride: 103 mmol/L (ref 96–106)
Creatinine, Ser: 0.9 mg/dL (ref 0.57–1.00)
GFR calc Af Amer: 77 mL/min/{1.73_m2} (ref >59)
GFR calc non Af Amer: 66 mL/min/{1.73_m2} (ref >59)
Globulin, Total: 2.6 g/dL (ref 1.5–4.5)
Glucose: 102 mg/dL — ABNORMAL HIGH (ref 65–99)
Potassium: 5.3 mmol/L — ABNORMAL HIGH (ref 3.5–5.2)
Sodium: 144 mmol/L (ref 134–144)
Total Protein: 6.5 g/dL (ref 6.0–8.5)

## 2017-07-29 LAB — LIPID PANEL
Chol/HDL Ratio: 3.6 ratio (ref 0.0–4.4)
Cholesterol, Total: 209 mg/dL — ABNORMAL HIGH (ref 100–199)
HDL: 58 mg/dL (ref >39)
LDL Calculated: 134 mg/dL — ABNORMAL HIGH (ref 0–99)
Triglycerides: 87 mg/dL (ref 0–149)
VLDL Cholesterol Cal: 17 mg/dL (ref 5–40)

## 2017-08-01 ENCOUNTER — Telehealth: Payer: Self-pay | Admitting: Pediatrics

## 2017-08-02 ENCOUNTER — Other Ambulatory Visit: Payer: Self-pay

## 2017-08-02 ENCOUNTER — Telehealth: Payer: Self-pay | Admitting: Pediatrics

## 2017-08-02 MED ORDER — PRAVASTATIN SODIUM 80 MG PO TABS: 80.0000 mg | ORAL_TABLET | Freq: Every day | ORAL | 1 refills | Status: DC

## 2017-08-02 NOTE — Telephone Encounter (Signed)
In chart

## 2017-08-02 NOTE — Telephone Encounter (Signed)
lmtcb- refer to lab note 

## 2017-08-02 NOTE — Telephone Encounter (Signed)
Refer to lab note- rx has been sent per lab note.

## 2017-08-04 ENCOUNTER — Other Ambulatory Visit: Payer: Self-pay | Admitting: Pediatrics

## 2017-08-04 DIAGNOSIS — E559 Vitamin D deficiency, unspecified: Secondary | ICD-10-CM

## 2017-08-04 NOTE — Telephone Encounter (Signed)
Do Not see Vit D in EPIC 

## 2017-08-05 ENCOUNTER — Telehealth: Payer: Self-pay | Admitting: Pediatrics

## 2017-08-05 NOTE — Telephone Encounter (Signed)
Pt aware to avoid D or DM mucinex.  She is aware that she can take plain mucinex as directed on the box

## 2017-08-08 ENCOUNTER — Telehealth: Payer: Self-pay | Admitting: Pediatrics

## 2017-08-08 ENCOUNTER — Other Ambulatory Visit: Payer: Self-pay | Admitting: Pediatrics

## 2017-08-08 DIAGNOSIS — K219 Gastro-esophageal reflux disease without esophagitis: Secondary | ICD-10-CM

## 2017-08-08 NOTE — Telephone Encounter (Signed)
Apt made with Dr. Reece AgarG 04/16 at 10am.

## 2017-08-08 NOTE — Telephone Encounter (Signed)
Needs to be seen

## 2017-08-08 NOTE — Telephone Encounter (Signed)
Please take 50k vitamin D off pt's med list. She should take OTC 800iu of vitamin D a day. She does not need Rx vitamin D

## 2017-08-09 ENCOUNTER — Ambulatory Visit (INDEPENDENT_AMBULATORY_CARE_PROVIDER_SITE_OTHER): Payer: Medicare HMO | Admitting: Family Medicine

## 2017-08-09 ENCOUNTER — Encounter: Payer: Self-pay | Admitting: Family Medicine

## 2017-08-09 VITALS — BP 140/85 | HR 56 | Temp 97.6°F | Ht 60.0 in | Wt 239.0 lb

## 2017-08-09 DIAGNOSIS — J302 Other seasonal allergic rhinitis: Secondary | ICD-10-CM

## 2017-08-09 DIAGNOSIS — J9801 Acute bronchospasm: Secondary | ICD-10-CM | POA: Diagnosis not present

## 2017-08-09 MED ORDER — IPRATROPIUM-ALBUTEROL 0.5-2.5 (3) MG/3ML IN SOLN
3.0000 mL | Freq: Once | RESPIRATORY_TRACT | Status: AC
  Administered 2017-08-09: 3 mL via RESPIRATORY_TRACT

## 2017-08-09 MED ORDER — PREDNISONE 20 MG PO TABS: 40.0000 mg | ORAL_TABLET | Freq: Every day | ORAL | 0 refills | Status: AC

## 2017-08-09 MED ORDER — METHYLPREDNISOLONE ACETATE 80 MG/ML IJ SUSP
80.0000 mg | Freq: Once | INTRAMUSCULAR | Status: AC
Start: 2017-08-09 — End: 2017-08-09
  Administered 2017-08-09: 80 mg via INTRAMUSCULAR

## 2017-08-09 MED ORDER — ALBUTEROL SULFATE HFA 108 (90 BASE) MCG/ACT IN AERS: 2.0000 | INHALATION_SPRAY | Freq: Four times a day (QID) | RESPIRATORY_TRACT | 0 refills | Status: DC | PRN

## 2017-08-09 NOTE — Progress Notes (Signed)
Subjective: CC: Cough/ nasal congestion PCP: Eustaquio Maize, MD BJS:Allison Rollins is a 67 y.o. female presenting to clinic today for:  1. Cough/ nasal congestion Patient reports a 2-week history of hoarseness, rhinorrhea, thick clear phlegm and wheezing.  She denies shortness of breath, hemoptysis, fevers, chills, nausea, vomiting.  She notes her throat is raw from coughing.  Denies orthopnea, increased lower extremity edema.  She has been using Mucinex with minimal improvement in symptoms.  She thinks is allergy mediated.  She has not been using any antihistamines or albuterol because the albuterol ran out.  She used Lasix yesterday as well with good urine output but no change in her cough or wheeze.  No past medical history of asthma or COPD.  Patient is a former smoker and quit in 1996.   ROS: Per HPI  Allergies  Allergen Reactions  . Penicillins Itching and Rash    Has patient had a PCN reaction causing immediate rash, facial/tongue/throat swelling, SOB or lightheadedness with hypotension: no Has patient had a PCN reaction causing severe rash involving mucus membranes or skin necrosis: no Has patient had a PCN reaction that required hospitalization: no Has patient had a PCN reaction occurring within the last 10 years: no If all of the above answers are "NO", then may proceed with Cephalosporin use.   . Sulfa Antibiotics Rash   Past Medical History:  Diagnosis Date  . Anxiety   . Asthma   . CHF (congestive heart failure) ([place])   . COPD (chronic obstructive pulmonary disease) ([place])   . Depression   . Diabetes mellitus without complication ([place])   . HTN (hypertension)   . Hypoglycemia   . Kidney stones   . Parkinson's disease   . Pneumonia   . S/P colonoscopy 10/15/04   normal    Current Outpatient Medications:  .  acetaminophen (TYLENOL) 500 MG tablet, Take 500 mg by mouth every 6 (six) hours as needed for moderate pain. , Disp: , Rfl:  .  albuterol (PROVENTIL  HFA;VENTOLIN HFA) 108 (90 BASE) MCG/ACT inhaler, Inhale 1-2 puffs into the lungs every 6 (six) hours as needed for wheezing., Disp: 1 Inhaler, Rfl: 0 .  aspirin EC 81 MG tablet, Take 1 tablet (81 mg total) by mouth daily., Disp: , Rfl:  .  blood glucose meter kit and supplies, Accu-check Nano smartview meter, Disp: 1 each, Rfl: 0 .  Blood Glucose Monitoring Suppl (BLOOD GLUCOSE MONITOR SYSTEM) w/Device KIT, 1 Device by Does not apply route daily., Disp: 1 each, Rfl: 0 .  Blood Glucose Monitoring Suppl (TRUE METRIX AIR GLUCOSE METER) w/Device KIT, USE EVERY DAY AS DIRECTED, Disp: 1 kit, Rfl: 0 .  furosemide (LASIX) 20 MG tablet, Take 1 tablet (20 mg total) by mouth daily as needed., Disp: 90 tablet, Rfl: 1 .  gabapentin (NEURONTIN) 300 MG capsule, Take 1-2 capsules (300-600 mg total) by mouth 2 (two) times daily. Take 393m in the morning, 6014mat night, Disp: 270 capsule, Rfl: 1 .  glucose blood (TRUE METRIX BLOOD GLUCOSE TEST) test strip, Use to check blood sugars daily, Disp: 100 each, Rfl: 2 .  lisinopril (PRINIVIL,ZESTRIL) 10 MG tablet, Take 1 tablet (10 mg total) by mouth daily., Disp: 90 tablet, Rfl: 3 .  metFORMIN (GLUCOPHAGE) 500 MG tablet, Take 1 tablet (500 mg total) by mouth daily with breakfast., Disp: 90 tablet, Rfl: 1 .  nitroGLYCERIN (NITROSTAT) 0.4 MG SL tablet, Place 1 tablet (0.4 mg total) under the tongue every 5 (five)  minutes as needed for chest pain., Disp: 20 tablet, Rfl: 3 .  Omega-3 Fatty Acids (FISH OIL PO), Take 6 capsules by mouth daily. , Disp: , Rfl:  .  pantoprazole (PROTONIX) 40 MG tablet, TAKE 1 TABLET EVERY DAY, Disp: 90 tablet, Rfl: 0 .  pramipexole (MIRAPEX) 0.25 MG tablet, Take 0.5 tablets by mouth at bedtime., Disp: , Rfl:  .  pravastatin (PRAVACHOL) 80 MG tablet, Take 1 tablet (80 mg total) by mouth daily., Disp: 90 tablet, Rfl: 1 .  sertraline (ZOLOFT) 50 MG tablet, TAKE 1 TABLET (50 MG TOTAL) DAILY BY MOUTH., Disp: 90 tablet, Rfl: 1 .  Vitamin D,  Ergocalciferol, (DRISDOL) 50000 units CAPS capsule, TAKE 1 CAPSULE EVERY THURSDAY., Disp: 12 capsule, Rfl: 0 Social History   Socioeconomic History  . Marital status: Widowed    Spouse name: Not on file  . Number of children: 2  . Years of education: Not on file  . Highest education level: Not on file  Occupational History  . Occupation: disabled  Social Needs  . Financial resource strain: Not on file  . Food insecurity:    Worry: Not on file    Inability: Not on file  . Transportation needs:    Medical: Not on file    Non-medical: Not on file  Tobacco Use  . Smoking status: Former Smoker    Packs/day: 0.25    Years: 30.00    Pack years: 7.50    Types: Cigarettes    Start date: 01/04/1965    Last attempt to quit: 01/03/1995    Years since quitting: 22.6  . Smokeless tobacco: Never Used  Substance and Sexual Activity  . Alcohol use: No    Alcohol/week: 0.0 oz  . Drug use: No  . Sexual activity: Never  Lifestyle  . Physical activity:    Days per week: Not on file    Minutes per session: Not on file  . Stress: Not on file  Relationships  . Social connections:    Talks on phone: Not on file    Gets together: Not on file    Attends religious service: Not on file    Active member of club or organization: Not on file    Attends meetings of clubs or organizations: Not on file    Relationship status: Not on file  . Intimate partner violence:    Fear of current or ex partner: Not on file    Emotionally abused: Not on file    Physically abused: Not on file    Forced sexual activity: Not on file  Other Topics Concern  . Not on file  Social History Narrative  . Not on file   Family History  Problem Relation Age of Onset  . Coronary artery disease Father   . COPD Mother   . Asthma Brother   . Coronary artery disease Sister   . Diabetes Sister   . Coronary artery disease Brother   . Coronary artery disease Sister     Objective: Office vital signs reviewed. BP  140/85   Pulse (!) 56   Temp 97.6 F (36.4 C) (Oral)   Ht 5' (1.524 m)   Wt 239 lb (108.4 kg)   SpO2 96%   BMI 46.68 kg/m   Physical Examination:  General: Awake, alert, obese, nontoxic, No acute distress HEENT: Normal    Neck: No masses palpated. No lymphadenopathy; no JVD    Eyes: PERRLA, extraocular membranes intact, sclera white    Nose: nasal  turbinates moist, clear nasal discharge    Throat: moist mucus membranes.  Airway is patent Cardio: regular rate and rhythm, S1S2 heard, no murmurs appreciated Pulm; global expiratory wheeze noted; good air movement.  Normal work of breathing on room air  Assessment/ Plan: 67 y.o. female   1. Bronchospasm, acute No evidence of infection on today's exam.  Her vital signs were essentially normal except for mild bradycardia.  Oxygen saturation within normal limits on room air.  Her pulmonary exam was remarkable for global expiratory wheeze which improved, though still present, after administration of DuoNeb here in office.  She was given a dose of Depo-Medrol 80 mg here in office.  I prescribed her prednisone to take over the next 5 days starting tomorrow.  I advised her to obtain Claritin 10 mg over-the-counter to take daily.  She will use albuterol inhaler 2 puffs every 6 hours for the next 2 days then use as needed as directed.  I did advise her that if she has persistent need for albuterol inhaler, I would like her to return for pulmonary function testing to further evaluate.  Pending these tests, could consider maintenance inhaler.  Home care instructions reviewed.  Reasons for emergent evaluation emergency department discussed.  Follow-up as needed. - ipratropium-albuterol (DUONEB) 0.5-2.5 (3) MG/3ML nebulizer solution 3 mL - methylPREDNISolone acetate (DEPO-MEDROL) injection 80 mg  2. Seasonal allergies Claritin as above.    Meds ordered this encounter  Medications  . predniSONE (DELTASONE) 20 MG tablet    Sig: Take 2 tablets (40 mg  total) by mouth daily with breakfast for 5 days.    Dispense:  10 tablet    Refill:  0  . albuterol (PROVENTIL HFA;VENTOLIN HFA) 108 (90 Base) MCG/ACT inhaler    Sig: Inhale 2 puffs into the lungs every 6 (six) hours as needed for wheezing.    Dispense:  1 Inhaler    Refill:  0  . ipratropium-albuterol (DUONEB) 0.5-2.5 (3) MG/3ML nebulizer solution 3 mL  . methylPREDNISolone acetate (DEPO-MEDROL) injection 80 mg     Janora Norlander, DO [place] [facility] [phone]

## 2017-08-09 NOTE — Patient Instructions (Signed)
Your lung exam sounds like you are having bronchospasm/wheezing.  This is probably from the pollen.  I want you to get Claritin/loratadine and take 1 pill daily.  You may continue the Mucinex if you find this helpful.  You were given a dose of steroids here in office and I want you to start the prednisone tomorrow.  Take this every morning with breakfast.  Use your albuterol inhaler 2 puffs every 6 hours for the next 2 days then use it as needed as directed.  If you find that you are needing the albuterol multiple times per month, please return for reevaluation.  We may need to put you on a controller medicine.   Bronchospasm, Adult Bronchospasm is when airways in the lungs get smaller. When this happens, it can be hard to breathe. You may cough. You may also make a whistling sound when you breathe (wheeze). Follow these instructions at home: Medicines  Take over-the-counter and prescription medicines only as told by your doctor.  If you need to use an inhaler or nebulizer to take your medicine, ask your doctor how to use it.  If you were given a spacer, always use it with your inhaler. Lifestyle  Change your heating and air conditioning filter. Do this at least once a month.  Try not to use fireplaces and wood stoves.  Do not  smoke. Do not  allow smoking in your home.  Try not to use things that have a strong smell, like perfume.  Get rid of pests (such as roaches and mice) and their poop.  Remove any mold from your home.  Keep your house clean. Get rid of dust.  Use cleaning products that have no smell.  Replace carpet with wood, tile, or vinyl flooring.  Use allergy-proof pillows, mattress covers, and box spring covers.  Wash bed sheets and blankets every week. Use hot water. Dry them in a dryer.  Use blankets that are made of polyester or cotton.  Wash your hands often.  Keep pets out of your bedroom.  When you exercise, try not to breathe in cold air. General  instructions  Have a plan for getting medical care. Know these things: ? When to call your doctor. ? When to call local emergency services (911 in the [place] ? Where to go in an emergency.  Stay up to date on your shots (immunizations).  When you have an episode: ? Stay calm. ? Relax. ? Breathe slowly. Contact a doctor if:  Your muscles ache.  Your chest hurts.  The color of the mucus you cough up (sputum) changes from clear or white to yellow, green, gray, or bloody.  The mucus you cough up gets thicker.  You have a fever. Get help right away if:  The whistling sound gets worse, even after you take your medicines.  Your coughing gets worse.  You find it even harder to breathe.  Your chest hurts very much. Summary  Bronchospasm is when airways in the lungs get smaller.  When this happens, it can be hard to breathe. You may cough. You may also make a whistling sound when you breathe.  Stay away from things that cause you to have episodes. These include smoke or dust. This information is not intended to replace advice given to you by your health care provider. Make sure you discuss any questions you have with your health care provider. Document Released: 02/07/2009 Document Revised: 04/15/2016 Document Reviewed: 04/15/2016 Elsevier Interactive Patient Education  2017 [employer]

## 2017-08-15 ENCOUNTER — Other Ambulatory Visit: Payer: Self-pay | Admitting: Pediatrics

## 2017-08-15 DIAGNOSIS — R7303 Prediabetes: Secondary | ICD-10-CM

## 2017-08-27 ENCOUNTER — Other Ambulatory Visit: Payer: Self-pay | Admitting: Pediatrics

## 2017-08-27 DIAGNOSIS — E559 Vitamin D deficiency, unspecified: Secondary | ICD-10-CM

## 2017-09-06 ENCOUNTER — Telehealth: Payer: Self-pay | Admitting: Pediatrics

## 2017-09-06 NOTE — Telephone Encounter (Signed)
Do not see where a Vit D level has been drawn is why insurance is not covering

## 2017-09-07 NOTE — Addendum Note (Signed)
Addended by: [name] on: 09/07/2017 12:51 PM   Modules accepted: Orders

## 2017-09-07 NOTE — Telephone Encounter (Signed)
She should take daily over-the-counter 1000iu supplement of vitamin D.  We will recheck her vitamin D level the next time she comes in.

## 2017-09-07 NOTE — Telephone Encounter (Signed)
Aware, per details left on patient's voice mail.

## 2017-09-08 ENCOUNTER — Telehealth: Payer: Self-pay | Admitting: Pediatrics

## 2017-09-08 NOTE — Telephone Encounter (Signed)
Called and informed patient that she can take OTC vit D.

## 2017-09-22 ENCOUNTER — Telehealth: Payer: Self-pay | Admitting: Pediatrics

## 2017-09-22 DIAGNOSIS — R32 Unspecified urinary incontinence: Secondary | ICD-10-CM

## 2017-09-22 DIAGNOSIS — G629 Polyneuropathy, unspecified: Secondary | ICD-10-CM

## 2017-09-22 MED ORDER — PRAVASTATIN SODIUM 80 MG PO TABS: 80.0000 mg | ORAL_TABLET | Freq: Every day | ORAL | 1 refills | Status: DC

## 2017-09-22 MED ORDER — GABAPENTIN 300 MG PO CAPS: 300.0000 mg | ORAL_CAPSULE | Freq: Two times a day (BID) | ORAL | 1 refills | Status: DC

## 2017-09-22 NOTE — Telephone Encounter (Signed)
LM - pt may need appt - LM for her to call - order not written yet

## 2017-09-22 NOTE — Telephone Encounter (Signed)
Pt called - she says that she doesn't have any burning and no infection.

## 2017-09-22 NOTE — Telephone Encounter (Signed)
Fine to order. If the frequent urination is new we should see her to make sure she doesn't have an infection

## 2017-09-22 NOTE — Telephone Encounter (Signed)
Diaper RX written and faxed to [facility]

## 2017-10-03 DIAGNOSIS — R0689 Other abnormalities of breathing: Secondary | ICD-10-CM | POA: Diagnosis not present

## 2017-10-10 ENCOUNTER — Other Ambulatory Visit: Payer: Self-pay | Admitting: Pediatrics

## 2017-10-10 DIAGNOSIS — K219 Gastro-esophageal reflux disease without esophagitis: Secondary | ICD-10-CM

## 2017-10-14 ENCOUNTER — Ambulatory Visit: Payer: Medicare HMO | Admitting: Cardiology

## 2017-10-18 ENCOUNTER — Other Ambulatory Visit: Payer: Self-pay | Admitting: Pediatrics

## 2017-10-18 DIAGNOSIS — R7303 Prediabetes: Secondary | ICD-10-CM

## 2017-11-03 ENCOUNTER — Ambulatory Visit (INDEPENDENT_AMBULATORY_CARE_PROVIDER_SITE_OTHER): Payer: Medicare HMO | Admitting: Pediatrics

## 2017-11-03 ENCOUNTER — Encounter: Payer: Self-pay | Admitting: Pediatrics

## 2017-11-03 VITALS — BP 123/79 | HR 54 | Temp 98.1°F | Ht 60.0 in | Wt 251.2 lb

## 2017-11-03 DIAGNOSIS — E782 Mixed hyperlipidemia: Secondary | ICD-10-CM | POA: Diagnosis not present

## 2017-11-03 DIAGNOSIS — I10 Essential (primary) hypertension: Secondary | ICD-10-CM | POA: Diagnosis not present

## 2017-11-03 DIAGNOSIS — Z6841 Body Mass Index (BMI) 40.0 and over, adult: Secondary | ICD-10-CM | POA: Diagnosis not present

## 2017-11-03 DIAGNOSIS — E1142 Type 2 diabetes mellitus with diabetic polyneuropathy: Secondary | ICD-10-CM

## 2017-11-03 DIAGNOSIS — M545 Low back pain, unspecified: Secondary | ICD-10-CM

## 2017-11-03 DIAGNOSIS — G8929 Other chronic pain: Secondary | ICD-10-CM | POA: Diagnosis not present

## 2017-11-03 LAB — LIPID PANEL
Chol/HDL Ratio: 4.3 ratio (ref 0.0–4.4)
Cholesterol, Total: 199 mg/dL (ref 100–199)
HDL: 46 mg/dL (ref >39)
LDL Calculated: 123 mg/dL — ABNORMAL HIGH (ref 0–99)
Triglycerides: 152 mg/dL — ABNORMAL HIGH (ref 0–149)
VLDL Cholesterol Cal: 30 mg/dL (ref 5–40)

## 2017-11-03 LAB — BASIC METABOLIC PANEL
BUN/Creatinine Ratio: 10 — ABNORMAL LOW (ref 12–28)
BUN: 9 mg/dL (ref 8–27)
CO2: 27 mmol/L (ref 20–29)
Calcium: 9.5 mg/dL (ref 8.7–10.3)
Chloride: 101 mmol/L (ref 96–106)
Creatinine, Ser: 0.9 mg/dL (ref 0.57–1.00)
GFR calc Af Amer: 77 mL/min/{1.73_m2} (ref >59)
GFR calc non Af Amer: 66 mL/min/{1.73_m2} (ref >59)
Glucose: 102 mg/dL — ABNORMAL HIGH (ref 65–99)
Potassium: 4.7 mmol/L (ref 3.5–5.2)
Sodium: 144 mmol/L (ref 134–144)

## 2017-11-03 LAB — BAYER DCA HB A1C WAIVED: HB A1C (BAYER DCA - WAIVED): 5.7 % (ref <7.0)

## 2017-11-03 MED ORDER — MELOXICAM 7.5 MG PO TABS: 7.5000 mg | ORAL_TABLET | Freq: Every day | ORAL | 0 refills | Status: DC | PRN

## 2017-11-03 NOTE — Progress Notes (Signed)
  Subjective:   Patient ID: Allison Rollins, female    DOB: 1951/04/04, 67 y.o.   MRN: [id number] CC: Hyperlipidemia and Hypertension  HPI: Allison Rollins is a 67 y.o. female   Back pain: Bothering her more.  Mostly left side.  Worse when she is up moving around a lot.  No pain, numbness, tingling in her feet or legs..  Asks if she could take meloxicam.  Not taking any other NSAIDs.  She does take Tylenol sometimes.  Hypertension: Taking her medicines regularly.  No chest pain or shortness of breath, no lightheadedness  Hyperlipidemia: Taking statin regularly.  Dose recently increased.  No side effects.  Relevant past medical, surgical, family and social history reviewed. Allergies and medications reviewed and updated. Social History   Tobacco Use  Smoking Status Former Smoker  . Packs/day: 0.25  . Years: 30.00  . Pack years: 7.50  . Types: Cigarettes  . Start date: 01/04/1965  . Last attempt to quit: 01/03/1995  . Years since quitting: 22.8  Smokeless Tobacco Never Used   ROS: Per HPI   Objective:    BP 123/79   Pulse (!) 54   Temp 98.1 F (36.7 C) (Oral)   Ht 5' (1.524 m)   Wt 251 lb 3.2 oz (113.9 kg)   BMI 49.06 kg/m   Wt Readings from Last 3 Encounters:  11/03/17 251 lb 3.2 oz (113.9 kg)  08/09/17 239 lb (108.4 kg)  07/28/17 244 lb 6.4 oz (110.9 kg)    Gen: NAD, alert, cooperative with exam, NCAT EYES: EOMI, no conjunctival injection, or no icterus ENT:  TMs pearly gray b/l, OP without erythema LYMPH: no cervical LAD CV: NRRR, normal S1/S2, no murmur, distal pulses 2+ b/l Resp: CTABL, no wheezes, normal WOB Abd: +BS, soft, NTND. no guarding or organomegaly Ext: No edema, warm Neuro: Alert and oriented, strength equal b/l UE and LE, coordination grossly normal MSK: No point tenderness over spine.  Assessment & Plan:  Allison Rollins was seen today for hyperlipidemia and hypertension.  Diagnoses and all orders for this visit:  Type 2 diabetes mellitus with diabetic  polyneuropathy, without long-term current use of insulin (HCC) Well-controlled.  A1c 5.7.  Continue metformin, diet changes. -     Bayer DCA Hb A1c Waived  Chronic bilateral low back pain without sciatica Will do trial meloxicam.  Avoid ibuprofen, naproxen as well on this medicine.  P next visit.  Take only when needed. -     meloxicam (MOBIC) 7.5 MG tablet; Take 1 tablet (7.5 mg total) by mouth daily as needed for pain.  Benign hypertension Well-controlled, continue current medicines. -     Basic Metabolic Panel  Mixed hyperlipidemia Continue statin, dose recently increased. -     Lipid panel  Follow up plan: Return in about 3 months (around 02/03/2018). Rex Krasarol Kendric Sindelar, MD [facility]

## 2017-11-04 ENCOUNTER — Telehealth: Payer: Self-pay | Admitting: Pediatrics

## 2017-11-04 MED ORDER — VITAMIN D3 125 MCG (5000 UT) PO CAPS: 1.0000 | ORAL_CAPSULE | Freq: Every day | ORAL | 3 refills | Status: DC

## 2017-11-04 NOTE — Telephone Encounter (Signed)
Please review results and advise.

## 2017-11-04 NOTE — Telephone Encounter (Signed)
Now in results

## 2017-11-05 NOTE — Telephone Encounter (Signed)
Pt aware of results by Marian Sorrowlga

## 2017-11-07 ENCOUNTER — Other Ambulatory Visit: Payer: Self-pay | Admitting: *Deleted

## 2017-11-07 ENCOUNTER — Telehealth: Payer: Self-pay | Admitting: Pediatrics

## 2017-11-07 ENCOUNTER — Other Ambulatory Visit: Payer: Self-pay | Admitting: Pediatrics

## 2017-11-07 DIAGNOSIS — I10 Essential (primary) hypertension: Secondary | ICD-10-CM

## 2017-11-07 MED ORDER — VITAMIN D (ERGOCALCIFEROL) 1.25 MG (50000 UNIT) PO CAPS: 50000.0000 [IU] | ORAL_CAPSULE | ORAL | 2 refills | Status: DC

## 2017-11-07 MED ORDER — VITAMIN D3 125 MCG (5000 UT) PO CAPS: 1.0000 | ORAL_CAPSULE | ORAL | 3 refills | Status: DC

## 2017-11-07 NOTE — Telephone Encounter (Signed)
Pharmacy aware to cancel vitamin D script for 5000.  New script for dose of 50,000 weekly sent in.

## 2017-11-16 ENCOUNTER — Ambulatory Visit: Payer: Medicare HMO | Admitting: Cardiology

## 2017-11-16 ENCOUNTER — Encounter: Payer: Self-pay | Admitting: Cardiology

## 2017-11-16 ENCOUNTER — Other Ambulatory Visit: Payer: Self-pay

## 2017-11-16 VITALS — BP 155/93 | HR 54 | Ht 66.0 in | Wt 253.0 lb

## 2017-11-16 DIAGNOSIS — E782 Mixed hyperlipidemia: Secondary | ICD-10-CM | POA: Diagnosis not present

## 2017-11-16 DIAGNOSIS — I10 Essential (primary) hypertension: Secondary | ICD-10-CM | POA: Diagnosis not present

## 2017-11-16 DIAGNOSIS — I5032 Chronic diastolic (congestive) heart failure: Secondary | ICD-10-CM

## 2017-11-16 NOTE — Progress Notes (Signed)
Clinical Summary Ms. Heitman is a 67 y.o.female seen today for follow up of the following medical problems.   1. Chronic diastolic heart failure - some recent edema. Occasional SOB. - compliant with meds, takes lasix roughly every other day. Effective diuresis when taking - low sodium diet.   2. HTN - compliant with meds - checks at home, 150s/90s  3. COPD - compliant with inhalers  4. Bradycardia - chronic sinus bradycardia  - no signficant symptoms.   5. Hyperlipidemia 10/2017 TC 199 TG 152 HDL 46 LDL 123 - compliant with statin.    6. OSA screen - has appt with Dr Arsenio Katz in July   7. Chest pain  - isolated episode about 1 month ag. Pressure like feeling, better with deep breaths. 3/10 in severity. Tends to occur at rest. No other associated symptoms. No exertional symptoms. Lasts just a few minutes.  - admit 11/2015 with chest pain. Nuclear stress test without significant ischemia.    - no recent chest pain since last visit.   8. Prediabetes - followed by pcp  SH: has 3 grandkids, 2 sons. Husband just passed this March. Works at [employer].  One grandson will be starting at [employer] in 2 years. Granddaughter    Past Medical History:  Diagnosis Date  . Anxiety   . Asthma   . CHF (congestive heart failure) ([place])   . COPD (chronic obstructive pulmonary disease) ([place])   . Depression   . Diabetes mellitus without complication ([place])   . HTN (hypertension)   . Hypoglycemia   . Kidney stones   . Parkinson's disease   . Pneumonia   . S/P colonoscopy 10/15/04   normal     Allergies  Allergen Reactions  . Penicillins Itching and Rash    Has patient had a PCN reaction causing immediate rash, facial/tongue/throat swelling, SOB or lightheadedness with hypotension: no Has patient had a PCN reaction causing severe rash involving mucus membranes or skin necrosis: no Has patient had a PCN reaction that required hospitalization:  no Has patient had a PCN reaction occurring within the last 10 years: no If all of the above answers are "NO", then may proceed with Cephalosporin use.   . Sulfa Antibiotics Rash     Current Outpatient Medications  Medication Sig Dispense Refill  . acetaminophen (TYLENOL) 500 MG tablet Take 500 mg by mouth every 6 (six) hours as needed for moderate pain.     Marland Kitchen albuterol (PROVENTIL HFA;VENTOLIN HFA) 108 (90 Base) MCG/ACT inhaler Inhale 2 puffs into the lungs every 6 (six) hours as needed for wheezing. 1 Inhaler 0  . aspirin EC 81 MG tablet Take 1 tablet (81 mg total) by mouth daily.    . blood glucose meter kit and supplies Accu-check Nano smartview meter 1 each 0  . Blood Glucose Monitoring Suppl (BLOOD GLUCOSE MONITOR SYSTEM) w/Device KIT 1 Device by Does not apply route daily. 1 each 0  . Blood Glucose Monitoring Suppl (TRUE METRIX AIR GLUCOSE METER) w/Device KIT USE EVERY DAY AS DIRECTED 1 kit 0  . furosemide (LASIX) 20 MG tablet Take 1 tablet (20 mg total) by mouth daily as needed. 90 tablet 1  . gabapentin (NEURONTIN) 300 MG capsule Take 1-2 capsules (300-600 mg total) by mouth 2 (two) times daily. Take 318m in the morning, 6050mat night 270 capsule 1  . glucose blood (TRUE METRIX BLOOD GLUCOSE TEST) test strip Use to check blood sugars daily 100 each 2  .  lisinopril (PRINIVIL,ZESTRIL) 10 MG tablet TAKE 1 TABLET EVERY DAY 90 tablet 0  . meloxicam (MOBIC) 7.5 MG tablet Take 1 tablet (7.5 mg total) by mouth daily as needed for pain. 90 tablet 0  . metFORMIN (GLUCOPHAGE) 500 MG tablet TAKE 1 TABLET ONE TIME DAILY WITH BREAKFAST 90 tablet 0  . nitroGLYCERIN (NITROSTAT) 0.4 MG SL tablet Place 1 tablet (0.4 mg total) under the tongue every 5 (five) minutes as needed for chest pain. 20 tablet 3  . Omega-3 Fatty Acids (FISH OIL PO) Take 6 capsules by mouth daily.     . pantoprazole (PROTONIX) 40 MG tablet TAKE 1 TABLET EVERY DAY 90 tablet 0  . pramipexole (MIRAPEX) 0.25 MG tablet Take 0.5  tablets by mouth at bedtime.    . pravastatin (PRAVACHOL) 80 MG tablet Take 1 tablet (80 mg total) by mouth daily. 90 tablet 1  . sertraline (ZOLOFT) 50 MG tablet TAKE 1 TABLET (50 MG TOTAL) DAILY BY MOUTH. 90 tablet 1  . Vitamin D, Ergocalciferol, (DRISDOL) 50000 units CAPS capsule Take 1 capsule (50,000 Units total) by mouth every 7 (seven) days. 16 capsule 2   No current facility-administered medications for this visit.      Past Surgical History:  Procedure Laterality Date  . CHOLECYSTECTOMY    . COLONOSCOPY  10/15/2004   Normal rectum/Normal colon  . COLONOSCOPY N/A 05/10/2016   Procedure: COLONOSCOPY;  Surgeon: Danie Binder, MD;  Location: [facility];  Service: Endoscopy;  Laterality: N/A;  10:30  . ESOPHAGOGASTRODUODENOSCOPY  09/11/2010   Dysphagia likely multifactorial (possible Candida esophagitis, likely nonspecific esophageal motility disorder, and/or uncontrolled gastroesophageal reflux disease), status post empiric dilation  . KIDNEY STONE SURGERY       Allergies  Allergen Reactions  . Penicillins Itching and Rash    Has patient had a PCN reaction causing immediate rash, facial/tongue/throat swelling, SOB or lightheadedness with hypotension: no Has patient had a PCN reaction causing severe rash involving mucus membranes or skin necrosis: no Has patient had a PCN reaction that required hospitalization: no Has patient had a PCN reaction occurring within the last 10 years: no If all of the above answers are "NO", then may proceed with Cephalosporin use.   . Sulfa Antibiotics Rash      Family History  Problem Relation Age of Onset  . Coronary artery disease Father   . COPD Mother   . Asthma Brother   . Coronary artery disease Sister   . Diabetes Sister   . Coronary artery disease Brother   . Coronary artery disease Sister      Social History Ms. Laker reports that she quit smoking about 22 years ago. Her smoking use included cigarettes. She started  smoking about 52 years ago. She has a 7.50 pack-year smoking history. She has never used smokeless tobacco. Ms. Vandoren reports that she does not drink alcohol.   Review of Systems CONSTITUTIONAL: No weight loss, fever, chills, weakness or fatigue.  HEENT: Eyes: No visual loss, blurred vision, double vision or yellow sclerae.No hearing loss, sneezing, congestion, runny nose or sore throat.  SKIN: No rash or itching.  CARDIOVASCULAR: per hpi RESPIRATORY: per hpi GASTROINTESTINAL: No anorexia, nausea, vomiting or diarrhea. No abdominal pain or blood.  GENITOURINARY: No burning on urination, no polyuria NEUROLOGICAL: No headache, dizziness, syncope, paralysis, ataxia, numbness or tingling in the extremities. No change in bowel or bladder control.  MUSCULOSKELETAL: No muscle, back pain, joint pain or stiffness.  LYMPHATICS: No enlarged nodes. No history  of splenectomy.  PSYCHIATRIC: No history of depression or anxiety.  ENDOCRINOLOGIC: No reports of sweating, cold or heat intolerance. No polyuria or polydipsia.  Marland Kitchen   Physical Examination Vitals:   11/16/17 1038  BP: (!) 155/93  Pulse: (!) 54  SpO2: 95%   Filed Weights   11/16/17 1038  Weight: 253 lb (114.8 kg)    Gen: resting comfortably, no acute distress HEENT: no scleral icterus, pupils equal round and reactive, no palptable cervical adenopathy,  CV: RRR, no mr/g, no jvd Resp: Clear to auscultation bilaterally GI: abdomen is soft, non-tender, non-distended, normal bowel sounds, no hepatosplenomegaly MSK: extremities are warm, no edema.  Skin: warm, no rash Neuro:  no focal deficits Psych: appropriate affect   Diagnostic Studies 11/2013 Echo Study Conclusions  - Procedure narrative: Transthoracic echocardiography. Image quality was suboptimal. The study was technically difficult, as a result of poor sound wave transmission and body habitus. - Left ventricle: The cavity size was normal. Wall thickness was increased in a  pattern of mild LVH. Systolic function was low normal. The estimated ejection fraction was approximately 50%. Images were inadequate for LV wall motion assessment, but no gross regional variation was noted. Features are consistent with a pseudonormal left ventricular filling pattern, with concomitant abnormal relaxation and increased filling pressure (grade 2 diastolic dysfunction). Doppler parameters are consistent with both elevated ventricular end-diastolic filling pressure and elevated left atrial filling pressure. - Mitral valve: Mildly calcified annulus. Mildly thickened leaflets . There was mild regurgitation. - Left atrium: The atrium was moderately to severely dilated.  11/2015  Nuclear stress  There was no ST segment deviation noted during stress.  No T wave inversion was noted during stress.  Findings consistent with prior myocardial infarction.  This is a low risk study.  The left ventricular ejection fraction is mildly decreased (45-54%).  Small inferolateral wall infarct at mid and basal level EF estimated 50% but looks normal no ischemia     Assessment and Plan  1. Chronic diastolic heart failure - symptoms at times, encouraged to take her lasix more regularly as needed  2. HTN -elevated, follow with increased diuresis.   3. Bradycardia - chronic sinus bradycardia that is asymptomatic - continue to monitor at this time.   4. Hyperlipidemia - continue current statin.   5. Chest pain - long history of chest pain. Most recently admit 11/2015 with chest pain, negative nuclear stress test - no recent symptoms, continue to monitor.  - EKG shows SR, no ischemic changes.    F/u 1 year     Arnoldo Lenis, M.D.

## 2017-11-16 NOTE — Patient Instructions (Signed)

## 2017-11-28 ENCOUNTER — Encounter: Payer: Self-pay | Admitting: Cardiology

## 2017-12-06 ENCOUNTER — Other Ambulatory Visit: Payer: Self-pay | Admitting: Pediatrics

## 2017-12-20 ENCOUNTER — Telehealth: Payer: Self-pay | Admitting: Pediatrics

## 2017-12-21 ENCOUNTER — Other Ambulatory Visit: Payer: Self-pay | Admitting: Pediatrics

## 2017-12-21 DIAGNOSIS — R7303 Prediabetes: Secondary | ICD-10-CM

## 2017-12-22 NOTE — Telephone Encounter (Signed)
Returned call, no answer. Back brace not recommended. Could we get her back into physical therapy? She hasnt had follow up with the back specialist since 02/2017, might be time to get back in to be seen if symptoms are worsening. If she asks, Medicare wouldn't be the one calling about qualifying for a back brace but a company trying to sell back braces might have called, which is illegal for them to do.   I am OK with her trying massage if she wants to. If it makes anything worse she should stop.

## 2017-12-22 NOTE — Telephone Encounter (Signed)
Pt aware.

## 2017-12-23 ENCOUNTER — Ambulatory Visit: Payer: Medicare HMO | Admitting: Family Medicine

## 2017-12-25 ENCOUNTER — Emergency Department (HOSPITAL_COMMUNITY): Payer: Medicare HMO

## 2017-12-25 ENCOUNTER — Emergency Department (HOSPITAL_COMMUNITY)
Admission: EM | Admit: 2017-12-25 | Discharge: 2017-12-25 | Disposition: A | Discharge status: Home / Self Care | Payer: Medicare HMO | Attending: Emergency Medicine | Admitting: Emergency Medicine

## 2017-12-25 ENCOUNTER — Encounter (HOSPITAL_COMMUNITY): Payer: Self-pay

## 2017-12-25 ENCOUNTER — Other Ambulatory Visit: Payer: Self-pay

## 2017-12-25 DIAGNOSIS — E119 Type 2 diabetes mellitus without complications: Secondary | ICD-10-CM | POA: Insufficient documentation

## 2017-12-25 DIAGNOSIS — J209 Acute bronchitis, unspecified: Secondary | ICD-10-CM | POA: Diagnosis not present

## 2017-12-25 DIAGNOSIS — R069 Unspecified abnormalities of breathing: Secondary | ICD-10-CM | POA: Diagnosis not present

## 2017-12-25 DIAGNOSIS — Z87891 Personal history of nicotine dependence: Secondary | ICD-10-CM | POA: Insufficient documentation

## 2017-12-25 DIAGNOSIS — J40 Bronchitis, not specified as acute or chronic: Secondary | ICD-10-CM

## 2017-12-25 DIAGNOSIS — R609 Edema, unspecified: Secondary | ICD-10-CM | POA: Diagnosis not present

## 2017-12-25 DIAGNOSIS — R1110 Vomiting, unspecified: Secondary | ICD-10-CM | POA: Insufficient documentation

## 2017-12-25 DIAGNOSIS — I10 Essential (primary) hypertension: Secondary | ICD-10-CM | POA: Insufficient documentation

## 2017-12-25 DIAGNOSIS — Z7982 Long term (current) use of aspirin: Secondary | ICD-10-CM | POA: Insufficient documentation

## 2017-12-25 DIAGNOSIS — Z79899 Other long term (current) drug therapy: Secondary | ICD-10-CM | POA: Insufficient documentation

## 2017-12-25 DIAGNOSIS — K2100 Gastro-esophageal reflux disease with esophagitis, without bleeding: Secondary | ICD-10-CM

## 2017-12-25 DIAGNOSIS — N200 Calculus of kidney: Secondary | ICD-10-CM | POA: Diagnosis not present

## 2017-12-25 DIAGNOSIS — R0902 Hypoxemia: Secondary | ICD-10-CM | POA: Diagnosis not present

## 2017-12-25 DIAGNOSIS — K219 Gastro-esophageal reflux disease without esophagitis: Secondary | ICD-10-CM | POA: Diagnosis not present

## 2017-12-25 DIAGNOSIS — J984 Other disorders of lung: Secondary | ICD-10-CM | POA: Diagnosis not present

## 2017-12-25 DIAGNOSIS — R0602 Shortness of breath: Secondary | ICD-10-CM | POA: Diagnosis not present

## 2017-12-25 LAB — COMPREHENSIVE METABOLIC PANEL
ALT: 14 U/L (ref 0–44)
AST: 16 U/L (ref 15–41)
Albumin: 3.7 g/dL (ref 3.5–5.0)
Alkaline Phosphatase: 77 U/L (ref 38–126)
Anion gap: 8 (ref 5–15)
BUN: 13 mg/dL (ref 8–23)
CO2: 30 mmol/L (ref 22–32)
Calcium: 8.9 mg/dL (ref 8.9–10.3)
Chloride: 104 mmol/L (ref 98–111)
Creatinine, Ser: 0.78 mg/dL (ref 0.44–1.00)
GFR calc Af Amer: >60 mL/min (ref >60)
GFR calc non Af Amer: >60 mL/min (ref >60)
Glucose, Bld: 116 mg/dL — ABNORMAL HIGH (ref 70–99)
Potassium: 4.1 mmol/L (ref 3.5–5.1)
Sodium: 142 mmol/L (ref 135–145)
Total Bilirubin: 1 mg/dL (ref 0.3–1.2)
Total Protein: 6.8 g/dL (ref 6.5–8.1)

## 2017-12-25 LAB — CBC
HCT: 43.6 % (ref 36.0–46.0)
Hemoglobin: 14.1 g/dL (ref 12.0–15.0)
MCH: 29.1 pg (ref 26.0–34.0)
MCHC: 32.3 g/dL (ref 30.0–36.0)
MCV: 89.9 fL (ref 78.0–100.0)
Platelets: 219 10*3/uL (ref 150–400)
RBC: 4.85 MIL/uL (ref 3.87–5.11)
RDW: 13 % (ref 11.5–15.5)
WBC: 7.1 10*3/uL (ref 4.0–10.5)

## 2017-12-25 LAB — BRAIN NATRIURETIC PEPTIDE: B Natriuretic Peptide: 261 pg/mL — ABNORMAL HIGH (ref 0.0–100.0)

## 2017-12-25 LAB — TROPONIN I: Troponin I: <0.03 ng/mL (ref <0.03)

## 2017-12-25 MED ORDER — ONDANSETRON 4 MG PO TBDP
ORAL_TABLET | ORAL | Status: AC
  Administered 2017-12-25: 13:00:00
  Filled 2017-12-25: qty 1

## 2017-12-25 MED ORDER — DOXYCYCLINE HYCLATE 100 MG PO CAPS: 100.0000 mg | ORAL_CAPSULE | Freq: Two times a day (BID) | ORAL | 0 refills | Status: DC

## 2017-12-25 MED ORDER — IOPAMIDOL (ISOVUE-300) INJECTION 61%
100.0000 mL | Freq: Once | INTRAVENOUS | Status: AC | PRN
  Administered 2017-12-25: 100 mL via INTRAVENOUS

## 2017-12-25 MED ORDER — METOCLOPRAMIDE HCL 5 MG/ML IJ SOLN
10.0000 mg | Freq: Once | INTRAMUSCULAR | Status: AC
  Administered 2017-12-25: 10 mg via INTRAVENOUS
  Filled 2017-12-25: qty 2

## 2017-12-25 MED ORDER — ONDANSETRON HCL 4 MG/2ML IJ SOLN: 4.0000 mg | Freq: Once | INTRAMUSCULAR | Status: DC

## 2017-12-25 MED ORDER — ONDANSETRON 4 MG PO TBDP: ORAL_TABLET | ORAL | 0 refills | Status: DC

## 2017-12-25 NOTE — ED Triage Notes (Signed)
EMS reports pt has had increasing sob x 1 week.  Reports history of acid reflux and had n/v last night.  SOB worse today.

## 2017-12-25 NOTE — ED Notes (Signed)
To CT via stretcher

## 2017-12-25 NOTE — Discharge Instructions (Addendum)
Follow-up with your family doctor next week.  Increase taking your Protonix so you are taking 1 pill twice a day,  follow up with Dr. Karilyn Cota for stomach specialist

## 2017-12-25 NOTE — ED Notes (Signed)
Out of bed to BR Ambulates slowly with cane assist  Pt reports son wants her to stay overnight as there will be noone to check on her

## 2017-12-25 NOTE — ED Notes (Signed)
Pt reports that she was told she could take her lasix as needed  Reports that she does not take as it makes her "give out" and that she cannot go out because "I'm in the bathroom" Reports that she is the last child of her parents living   Ed: weigh daily

## 2017-12-25 NOTE — ED Notes (Signed)
PT reports she has not been taking her Lasix for the past 2 weeks due to it making her very fatigued.

## 2017-12-25 NOTE — ED Notes (Signed)
From CT 

## 2017-12-25 NOTE — ED Provider Notes (Signed)
[facility] EMERGENCY DEPARTMENT Provider Note   CSN: [id number] Arrival date & time: 12/25/17  1011     History   Chief Complaint Chief Complaint  Patient presents with  . Shortness of Breath    HPI Allison Rollins is a 67 y.o. female.  Patient complains of cough shortness of breath yellow sputum production and worsening indigestion  The history is provided by the patient. No language interpreter was used.  Shortness of Breath  This is a new problem. The problem occurs continuously.The current episode started more than 2 days ago. The problem has not changed since onset.Associated symptoms include abdominal pain. Pertinent negatives include no fever, no headaches, no cough, no chest pain and no rash. Precipitated by: Unknown. She has tried nothing for the symptoms. The treatment provided no relief. She has had prior hospitalizations. She has had prior ED visits. Associated medical issues include COPD.    Past Medical History:  Diagnosis Date  . Anxiety   . Asthma   . CHF (congestive heart failure) ([place])   . COPD (chronic obstructive pulmonary disease) ([place])   . Depression   . Diabetes mellitus without complication ([place])   . HTN (hypertension)   . Hypoglycemia   . Kidney stones   . Parkinson's disease   . Pneumonia   . S/P colonoscopy 10/15/04   normal    Patient Active Problem List   Diagnosis Date Noted  . Osteopenia of forearm 03/04/2017  . Depression 12/26/2015  . Chest pain 12/22/2015  . Generalized anxiety disorder 03/12/2015  . Prediabetes 03/12/2015  . Back pain 03/12/2015  . Stress incontinence 01/06/2015  . Chronic diastolic heart failure ([place]) 01/03/2015  . Hypokalemia 07/28/2013  . Obesity 07/01/2011  . Benign hypertension 07/01/2011  . Hyperlipidemia 07/01/2011  . Dysphagia 09/08/2010  . GERD (gastroesophageal reflux disease) 09/08/2010    Past Surgical History:  Procedure Laterality Date  . CHOLECYSTECTOMY    . COLONOSCOPY  10/15/2004   Normal rectum/Normal colon  . COLONOSCOPY N/A 05/10/2016   Procedure: COLONOSCOPY;  Surgeon: Danie Binder, MD;  Location: [facility] ENDO SUITE;  Service: Endoscopy;  Laterality: N/A;  10:30  . ESOPHAGOGASTRODUODENOSCOPY  09/11/2010   Dysphagia likely multifactorial (possible Candida esophagitis, likely nonspecific esophageal motility disorder, and/or uncontrolled gastroesophageal reflux disease), status post empiric dilation  . KIDNEY STONE SURGERY       OB History   None      Home Medications    Prior to Admission medications   Medication Sig Start Date End Date Taking? Authorizing Provider  acetaminophen (TYLENOL) 500 MG tablet Take 500 mg by mouth every 6 (six) hours as needed for moderate pain.    Yes [provider]  albuterol (PROVENTIL HFA;VENTOLIN HFA) 108 (90 Base) MCG/ACT inhaler Inhale 2 puffs into the lungs every 6 (six) hours as needed for wheezing. 08/09/17  Yes Ronnie Doss M, DO  aspirin EC 81 MG tablet Take 1 tablet (81 mg total) by mouth daily. 12/23/15  Yes Kathie Dike, MD  blood glucose meter kit and supplies Accu-check Nano smartview meter 05/19/16  Yes Eustaquio Maize, MD  furosemide (LASIX) 20 MG tablet Take 1 tablet (20 mg total) by mouth daily as needed. 07/28/17  Yes Eustaquio Maize, MD  gabapentin (NEURONTIN) 300 MG capsule Take 1-2 capsules (300-600 mg total) by mouth 2 (two) times daily. Take 331m in the morning, 6075mat night 09/22/17  Yes ViEustaquio MaizeMD  lisinopril (PRINIVIL,ZESTRIL) 10 MG tablet TAKE 1 TABLET EVERY  DAY 11/08/17  Yes Eustaquio Maize, MD  meloxicam (MOBIC) 7.5 MG tablet Take 1 tablet (7.5 mg total) by mouth daily as needed for pain. 11/03/17  Yes Eustaquio Maize, MD  metFORMIN (GLUCOPHAGE) 500 MG tablet TAKE 1 TABLET ONE TIME DAILY WITH BREAKFAST 12/23/17  Yes Eustaquio Maize, MD  nitroGLYCERIN (NITROSTAT) 0.4 MG SL tablet Place 1 tablet (0.4 mg total) under the tongue every 5 (five) minutes as needed for chest pain. 12/25/15   Yes Eustaquio Maize, MD  Omega-3 Fatty Acids (FISH OIL PO) Take 1 capsule by mouth daily.    Yes [provider]  pantoprazole (PROTONIX) 40 MG tablet TAKE 1 TABLET EVERY DAY 10/11/17  Yes Eustaquio Maize, MD  pramipexole (MIRAPEX) 0.25 MG tablet Take 0.5 tablets by mouth at bedtime. 01/31/17  Yes [provider]  pravastatin (PRAVACHOL) 80 MG tablet Take 1 tablet (80 mg total) by mouth daily. 09/22/17  Yes Eustaquio Maize, MD  sertraline (ZOLOFT) 50 MG tablet TAKE 1 TABLET (50 MG TOTAL) DAILY BY MOUTH. 07/28/17  Yes Eustaquio Maize, MD  TRUE METRIX BLOOD GLUCOSE TEST test strip TEST BLOOD SUGAR ONE TIME DAILY 12/07/17  Yes Eustaquio Maize, MD  doxycycline (VIBRAMYCIN) 100 MG capsule Take 1 capsule (100 mg total) by mouth 2 (two) times daily. One po bid x 7 days 12/25/17   Milton Ferguson, MD  Vitamin D, Ergocalciferol, (DRISDOL) 50000 units CAPS capsule Take 1 capsule (50,000 Units total) by mouth every 7 (seven) days. 11/07/17   Eustaquio Maize, MD    Family History Family History  Problem Relation Age of Onset  . Coronary artery disease Father   . COPD Mother   . Asthma Brother   . Coronary artery disease Sister   . Diabetes Sister   . Coronary artery disease Brother   . Coronary artery disease Sister     Social History Social History   Tobacco Use  . Smoking status: Former Smoker    Packs/day: 0.25    Years: 30.00    Pack years: 7.50    Types: Cigarettes    Start date: 01/04/1965    Last attempt to quit: 01/03/1995    Years since quitting: 22.9  . Smokeless tobacco: Never Used  Substance Use Topics  . Alcohol use: No    Alcohol/week: 0.0 standard drinks  . Drug use: No     Allergies   Penicillins and Sulfa antibiotics   Review of Systems Review of Systems  Constitutional: Negative for appetite change, fatigue and fever.  HENT: Negative for congestion, ear discharge and sinus pressure.   Eyes: Negative for discharge.  Respiratory: Positive for  shortness of breath. Negative for cough.   Cardiovascular: Negative for chest pain.  Gastrointestinal: Positive for abdominal pain. Negative for diarrhea.  Genitourinary: Negative for frequency and hematuria.  Musculoskeletal: Negative for back pain.  Skin: Negative for rash.  Neurological: Negative for seizures and headaches.  Psychiatric/Behavioral: Negative for hallucinations.     Physical Exam Updated Vital Signs BP (!) 157/85   Pulse (!) 56   Temp 98.1 F (36.7 C) (Oral)   Resp (!) 23   Ht 5' 6"  (1.676 m)   Wt 111.1 kg   SpO2 100%   BMI 39.54 kg/m   Physical Exam  Constitutional: She is oriented to person, place, and time. She appears well-developed.  HENT:  Head: Normocephalic.  Eyes: Conjunctivae and EOM are normal. No scleral icterus.  Neck: Neck supple. No thyromegaly  present.  Cardiovascular: Normal rate and regular rhythm. Exam reveals no gallop and no friction rub.  No murmur heard. Pulmonary/Chest: No stridor. She has no wheezes. She has no rales. She exhibits no tenderness.  Abdominal: She exhibits no distension. There is no tenderness. There is no rebound.  Musculoskeletal: Normal range of motion. She exhibits no edema.  Lymphadenopathy:    She has no cervical adenopathy.  Neurological: She is oriented to person, place, and time. She exhibits normal muscle tone. Coordination normal.  Skin: No rash noted. No erythema.  Psychiatric: She has a normal mood and affect. Her behavior is normal.     ED Treatments / Results  Labs (all labs ordered are listed, but only abnormal results are displayed) Labs Reviewed  BRAIN NATRIURETIC PEPTIDE - Abnormal; Notable for the following components:      Result Value   B Natriuretic Peptide 261.0 (*)    All other components within normal limits  COMPREHENSIVE METABOLIC PANEL - Abnormal; Notable for the following components:   Glucose, Bld 116 (*)    All other components within normal limits  CBC  TROPONIN I     EKG None  Radiology Dg Chest 2 View  Result Date: 12/25/2017 CLINICAL DATA:  Acute onset of chest pain and shortness of breath with exertion that began today. Current history of chronic diastolic heart failure and COPD. EXAM: CHEST - 2 VIEW COMPARISON:  12/22/2015, 05/08/2014 and earlier. FINDINGS: AP ERECT and LATERAL images were obtained. Marked enlargement of the cardiac silhouette, unchanged. Thoracic aorta mildly atherosclerotic, unchanged. Hilar and mediastinal contours otherwise unremarkable. Mildly prominent bronchovascular markings diffusely and mild central peribronchial thickening, unchanged. Stable vertically oriented linear scarring in the LEFT LOWER LOBE. Lungs otherwise clear. Pulmonary vascularity normal without evidence of pulmonary edema. No visible pleural effusions. Degenerative changes involving the thoracic spine. IMPRESSION: 1. No acute cardiopulmonary disease. Stable scarring in the LEFT LOWER LOBE. 2. Stable mild changes of chronic bronchitis and/or asthma. Stable marked cardiomegaly without evidence of pulmonary edema. Electronically Signed   By: Evangeline Dakin M.D.   On: 12/25/2017 11:26    Procedures Procedures (including critical care time)  Medications Ordered in ED Medications - No data to display   Initial Impression / Assessment and Plan / ED Course  I have reviewed the triage vital signs and the nursing notes.  Pertinent labs & imaging results that were available during my care of the patient were reviewed by me and considered in my medical decision making (see chart for details).    Labs and x-ray unremarkable except for mildly elevated BNP.  Patient will be placed on doxycycline for respiratory infection and told to increase her Protonix to twice a day and follow-up with her PCP   Final Clinical Impressions(s) / ED Diagnoses   Final diagnoses:  Bronchitis  Gastroesophageal reflux disease with esophagitis    ED Discharge Orders          Ordered    doxycycline (VIBRAMYCIN) 100 MG capsule  2 times daily     12/25/17 1301           Milton Ferguson, MD 12/25/17 1308

## 2017-12-25 NOTE — ED Notes (Signed)
From Rad 

## 2017-12-25 NOTE — ED Notes (Signed)
Pt has eaten, now vomiting

## 2017-12-25 NOTE — ED Notes (Signed)
Dr Z in to reassess and discuss findings 

## 2017-12-25 NOTE — ED Notes (Signed)
Pt reports she has  No way home States son is in church until 1230 Informed that I is almost 1 Meal provided  Verlon Au, Charity fundraiser, CN asked regarding transport service for pt home

## 2017-12-25 NOTE — ED Notes (Signed)
Pt reports vomiting after eating for the last 3 weeks  Dr Herma Carson informed

## 2017-12-25 NOTE — ED Notes (Signed)
Dr Z in to discuss findings 

## 2017-12-27 ENCOUNTER — Telehealth: Payer: Self-pay | Admitting: Pediatrics

## 2017-12-27 NOTE — Telephone Encounter (Signed)
Appointment scheduled for Friday 12/30/17 with dr Oswaldo Done

## 2017-12-30 ENCOUNTER — Encounter: Payer: Self-pay | Admitting: Pediatrics

## 2017-12-30 ENCOUNTER — Ambulatory Visit (INDEPENDENT_AMBULATORY_CARE_PROVIDER_SITE_OTHER): Payer: Medicare HMO | Admitting: Pediatrics

## 2017-12-30 VITALS — BP 114/71 | HR 61 | Temp 97.5°F | Resp 18 | Ht 66.0 in | Wt 243.2 lb

## 2017-12-30 DIAGNOSIS — J988 Other specified respiratory disorders: Secondary | ICD-10-CM

## 2017-12-30 DIAGNOSIS — I5032 Chronic diastolic (congestive) heart failure: Secondary | ICD-10-CM

## 2017-12-30 DIAGNOSIS — I10 Essential (primary) hypertension: Secondary | ICD-10-CM

## 2017-12-30 DIAGNOSIS — K219 Gastro-esophageal reflux disease without esophagitis: Secondary | ICD-10-CM

## 2017-12-30 DIAGNOSIS — L309 Dermatitis, unspecified: Secondary | ICD-10-CM

## 2017-12-30 DIAGNOSIS — G629 Polyneuropathy, unspecified: Secondary | ICD-10-CM

## 2017-12-30 MED ORDER — PRAMIPEXOLE DIHYDROCHLORIDE 0.25 MG PO TABS: 0.5000 mg | ORAL_TABLET | Freq: Every day | ORAL | 3 refills | Status: DC

## 2017-12-30 MED ORDER — GABAPENTIN 300 MG PO CAPS: 300.0000 mg | ORAL_CAPSULE | Freq: Two times a day (BID) | ORAL | 1 refills | Status: DC

## 2017-12-30 MED ORDER — TRIAMCINOLONE ACETONIDE 0.025 % EX OINT: 1.0000 "application " | TOPICAL_OINTMENT | Freq: Two times a day (BID) | CUTANEOUS | 0 refills | Status: DC

## 2017-12-30 NOTE — Progress Notes (Signed)
  Subjective:   Patient ID: Allison Rollins, female    DOB: July 04, 1950, 67 y.o.   MRN: [id number] CC: ER Follow up  HPI: Allison Rollins is a 67 y.o. female   Recently seen in emergency room with cough, shortness of breath productive of yellow sputum and worsening indigestion.  History of chronic diastolic heart failure.  Follows with cardiology.  She is feeling back to her normal self now.  Appetite is been okay.  Minimal cough now.  Has 2 more days of antibiotics that was started for respiratory infection.  Parkinson's disease: Following with neurology.  Takes pramipexole once a day at night.  Relevant past medical, surgical, family and social history reviewed. Allergies and medications reviewed and updated. ROS: Per HPI   Objective:    BP 114/71   Pulse 61   Temp (!) 97.5 F (36.4 C) (Oral)   Resp 18   Ht 5\' 6"  (1.676 m)   Wt 243 lb 3.2 oz (110.3 kg)   SpO2 95%   BMI 39.25 kg/m   Wt Readings from Last 3 Encounters:  12/30/17 243 lb 3.2 oz (110.3 kg)  12/25/17 245 lb (111.1 kg)  11/16/17 253 lb (114.8 kg)    Gen: NAD, alert, cooperative with exam, NCAT EYES: EOMI, no conjunctival injection, or no icterus CV: NRRR, normal S1/S2, no murmur, distal pulses 2+ b/l Resp: CTABL, no wheezes, normal WOB Abd: +BS, soft, NTND. no guarding or organomegaly Ext: Trace to 1+ pitting edema bilateral lower legs, warm Neuro: Alert and oriented, strength equal b/l UE and LE, coordination grossly normal Skin: Lichenification and thickening linear 4 to 5 cm across back and neck.  Assessment & Plan:  [name] was seen today for er and multiple med problems follow up  Diagnoses and all orders for this visit:  Chronic diastolic heart failure (HCC) Grade 2 diastolic dysfunction most recent echo from 2015.  Blood pressure was elevated while in the emergency room, BNP slightly elevated while there.  Swelling has improved since being home.  Blood pressure is now back to normal.    Neuropathy Continue gabapentin  Respiratory infection Continue doxycycline until course completed.  Benign hypertension Well-controlled today.  Continue current blood pressure medicines.  Gastroesophageal reflux disease, esophagitis presence not specified Continue twice a day Protonix for another week, then can try going back to once a day.  Last EGD was 2012.  Eczema Trial triamcinolone, use twice daily.  Follow up plan: As scheduled next month Rex Kras, MD [place] [facility] Medicine

## 2017-12-30 NOTE — Patient Instructions (Signed)
Chronic diastolic heart failure (HCC) Grade 2 diastolic dysfunction most recent echo from 2015.  Blood pressure was elevated while in the emergency room, BNP slightly elevated while there.  Swelling has improved since being home.  Blood pressure is now back to normal.   Neuropathy Continue gabapentin  Respiratory infection Continue doxycycline until course completed.  Benign hypertension Well-controlled today.  Continue current blood pressure medicines.  Gastroesophageal reflux disease, esophagitis presence not specified Continue twice a day Protonix for another week, then can try going back to once a day.  Last EGD was 2012.  Eczema Trial triamcinolone, use twice daily.

## 2018-01-06 ENCOUNTER — Encounter: Payer: Medicare HMO | Admitting: *Deleted

## 2018-01-12 ENCOUNTER — Ambulatory Visit (INDEPENDENT_AMBULATORY_CARE_PROVIDER_SITE_OTHER): Payer: Medicare HMO | Admitting: Family Medicine

## 2018-01-12 ENCOUNTER — Encounter: Payer: Self-pay | Admitting: Family Medicine

## 2018-01-12 VITALS — BP 138/84 | HR 66 | Temp 97.7°F | Ht 66.0 in | Wt 245.0 lb

## 2018-01-12 DIAGNOSIS — J209 Acute bronchitis, unspecified: Secondary | ICD-10-CM | POA: Diagnosis not present

## 2018-01-12 DIAGNOSIS — J302 Other seasonal allergic rhinitis: Secondary | ICD-10-CM

## 2018-01-12 DIAGNOSIS — J449 Chronic obstructive pulmonary disease, unspecified: Secondary | ICD-10-CM | POA: Diagnosis not present

## 2018-01-12 DIAGNOSIS — R05 Cough: Secondary | ICD-10-CM | POA: Diagnosis not present

## 2018-01-12 DIAGNOSIS — R059 Cough, unspecified: Secondary | ICD-10-CM

## 2018-01-12 DIAGNOSIS — G20 Parkinson's disease: Secondary | ICD-10-CM | POA: Diagnosis not present

## 2018-01-12 MED ORDER — ALBUTEROL SULFATE HFA 108 (90 BASE) MCG/ACT IN AERS: 2.0000 | INHALATION_SPRAY | Freq: Four times a day (QID) | RESPIRATORY_TRACT | 0 refills | Status: DC | PRN

## 2018-01-12 MED ORDER — CETIRIZINE HCL 10 MG PO TABS: 10.0000 mg | ORAL_TABLET | Freq: Every day | ORAL | 11 refills | Status: DC

## 2018-01-12 MED ORDER — ALBUTEROL SULFATE (2.5 MG/3ML) 0.083% IN NEBU: 2.5000 mg | INHALATION_SOLUTION | Freq: Four times a day (QID) | RESPIRATORY_TRACT | 1 refills | Status: AC | PRN

## 2018-01-12 MED ORDER — FLUTICASONE PROPIONATE 50 MCG/ACT NA SUSP: 2.0000 | Freq: Every day | NASAL | 6 refills | Status: DC

## 2018-01-12 NOTE — Progress Notes (Signed)
Subjective:    Patient ID: Allison Rollins, female    DOB: 12-05-1950, 67 y.o.   MRN: [id number]  Chief Complaint:  Sinusitis (eyes watering, runny nose, sinus drainage and congestion, cough; began 4 days ago)   HPI: Allison Rollins is a 67 y.o. female presenting on 01/12/2018 for Sinusitis (eyes watering, runny nose, sinus drainage and congestion, cough; began 4 days ago)  Pt presents today with complaints of productive cough, congestion, rhinorrhea, watery eyes, and wheezing. Pt states this started 4-5 days ago and has been constant. Pt denies fever or chills, shortness of breath, chest pain, or headache. She states she has not tried any home or over-the-counter remedies for the symptoms. Pt states she does have an outside cat and her symptoms do become worse around the cat.  1. Acute bronchitis, unspecified organism   2. Seasonal allergic rhinitis, unspecified trigger   3. Cough      Relevant past medical, surgical, family and social history reviewed and updated as indicated. Interim medical history since our last visit reviewed. Allergies and medications reviewed and updated. DATA REVIEWED: CHART IN EPIC  Family History reviewed for pertinent findings.  Past Medical History:  Diagnosis Date  . Anxiety   . Asthma   . CHF (congestive heart failure) ([place])   . COPD (chronic obstructive pulmonary disease) ([place])   . Depression   . Diabetes mellitus without complication ([place])   . HTN (hypertension)   . Hypoglycemia   . Kidney stones   . Parkinson's disease   . Pneumonia   . S/P colonoscopy 10/15/04   normal    Past Surgical History:  Procedure Laterality Date  . CHOLECYSTECTOMY    . COLONOSCOPY  10/15/2004   Normal rectum/Normal colon  . COLONOSCOPY N/A 05/10/2016   Procedure: COLONOSCOPY;  Surgeon: Danie Binder, MD;  Location: [facility];  Service: Endoscopy;  Laterality: N/A;  10:30  . ESOPHAGOGASTRODUODENOSCOPY  09/11/2010   Dysphagia likely multifactorial  (possible Candida esophagitis, likely nonspecific esophageal motility disorder, and/or uncontrolled gastroesophageal reflux disease), status post empiric dilation  . KIDNEY STONE SURGERY      Social History   Socioeconomic History  . Marital status: Widowed    Spouse name: Not on file  . Number of children: 2  . Years of education: Not on file  . Highest education level: Not on file  Occupational History  . Occupation: disabled  Social Needs  . Financial resource strain: Not on file  . Food insecurity:    Worry: Not on file    Inability: Not on file  . Transportation needs:    Medical: Not on file    Non-medical: Not on file  Tobacco Use  . Smoking status: Former Smoker    Packs/day: 0.25    Years: 30.00    Pack years: 7.50    Types: Cigarettes    Start date: 01/04/1965    Last attempt to quit: 01/03/1995    Years since quitting: 23.0  . Smokeless tobacco: Never Used  Substance and Sexual Activity  . Alcohol use: No    Alcohol/week: 0.0 standard drinks  . Drug use: No  . Sexual activity: Never  Lifestyle  . Physical activity:    Days per week: Not on file    Minutes per session: Not on file  . Stress: Not on file  Relationships  . Social connections:    Talks on phone: Not on file    Gets together: Not on file  Attends religious service: Not on file    Active member of club or organization: Not on file    Attends meetings of clubs or organizations: Not on file    Relationship status: Not on file  . Intimate partner violence:    Fear of current or ex partner: Not on file    Emotionally abused: Not on file    Physically abused: Not on file    Forced sexual activity: Not on file  Other Topics Concern  . Not on file  Social History Narrative  . Not on file    Outpatient Encounter Medications as of 01/12/2018  Medication Sig  . acetaminophen (TYLENOL) 500 MG tablet Take 500 mg by mouth every 6 (six) hours as needed for moderate pain.   Marland Kitchen albuterol (PROVENTIL  HFA;VENTOLIN HFA) 108 (90 Base) MCG/ACT inhaler Inhale 2 puffs into the lungs every 6 (six) hours as needed for wheezing.  Marland Kitchen aspirin EC 81 MG tablet Take 1 tablet (81 mg total) by mouth daily.  . blood glucose meter kit and supplies Accu-check Nano smartview meter  . furosemide (LASIX) 20 MG tablet Take 1 tablet (20 mg total) by mouth daily as needed.  . gabapentin (NEURONTIN) 300 MG capsule Take 1-2 capsules (300-600 mg total) by mouth 2 (two) times daily. Take 3763m in the morning, 6053mat night  . lisinopril (PRINIVIL,ZESTRIL) 10 MG tablet TAKE 1 TABLET EVERY DAY  . meloxicam (MOBIC) 7.5 MG tablet Take 1 tablet (7.5 mg total) by mouth daily as needed for pain.  . metFORMIN (GLUCOPHAGE) 500 MG tablet TAKE 1 TABLET ONE TIME DAILY WITH BREAKFAST  . nitroGLYCERIN (NITROSTAT) 0.4 MG SL tablet Place 1 tablet (0.4 mg total) under the tongue every 5 (five) minutes as needed for chest pain.  . Omega-3 Fatty Acids (FISH OIL PO) Take 1 capsule by mouth daily.   . ondansetron (ZOFRAN ODT) 4 MG disintegrating tablet 63m59mDT q4 hours prn nausea/vomit  . pantoprazole (PROTONIX) 40 MG tablet TAKE 1 TABLET EVERY DAY  . pramipexole (MIRAPEX) 0.25 MG tablet Take 2 tablets (0.5 mg total) by mouth at bedtime.  . pravastatin (PRAVACHOL) 80 MG tablet Take 1 tablet (80 mg total) by mouth daily.  . sertraline (ZOLOFT) 50 MG tablet TAKE 1 TABLET (50 MG TOTAL) DAILY BY MOUTH.  . triamcinolone (KENALOG) 0.025 % ointment Apply 1 application topically 2 (two) times daily.  . TRUE METRIX BLOOD GLUCOSE TEST test strip TEST BLOOD SUGAR ONE TIME DAILY  . Vitamin D, Ergocalciferol, (DRISDOL) 50000 units CAPS capsule Take 1 capsule (50,000 Units total) by mouth every 7 (seven) days.  . [DISCONTINUED] albuterol (PROVENTIL HFA;VENTOLIN HFA) 108 (90 Base) MCG/ACT inhaler Inhale 2 puffs into the lungs every 6 (six) hours as needed for wheezing.  . aMarland Kitchenbuterol (PROVENTIL) (2.5 MG/3ML) 0.083% nebulizer solution Take 3 mLs (2.5 mg total)  by nebulization every 6 (six) hours as needed for wheezing or shortness of breath.  . cetirizine (ZYRTEC) 10 MG tablet Take 1 tablet (10 mg total) by mouth daily.  . fluticasone (FLONASE) 50 MCG/ACT nasal spray Place 2 sprays into both nostrils daily.  . [DISCONTINUED] doxycycline (VIBRAMYCIN) 100 MG capsule Take 1 capsule (100 mg total) by mouth 2 (two) times daily. One po bid x 7 days   No facility-administered encounter medications on file as of 01/12/2018.     Allergies  Allergen Reactions  . Penicillins Itching and Rash    Has patient had a PCN reaction causing immediate rash, facial/tongue/throat swelling, SOB  or lightheadedness with hypotension: no Has patient had a PCN reaction causing severe rash involving mucus membranes or skin necrosis: no Has patient had a PCN reaction that required hospitalization: no Has patient had a PCN reaction occurring within the last 10 years: no If all of the above answers are "NO", then may proceed with Cephalosporin use.   . Sulfa Antibiotics Rash    Review of Systems  Constitutional: Negative for chills, fatigue and fever.  HENT: Positive for rhinorrhea, sinus pressure, sneezing and sore throat. Negative for ear pain, facial swelling, postnasal drip, sinus pain and tinnitus.   Eyes: Positive for discharge and itching. Negative for photophobia, pain, redness and visual disturbance.  Respiratory: Positive for cough and wheezing. Negative for chest tightness and shortness of breath.   Cardiovascular: Negative for chest pain and palpitations.  Musculoskeletal: Negative for arthralgias and myalgias.  Skin: Negative for color change and rash.  Neurological: Negative for dizziness, syncope, light-headedness and headaches.  All other systems reviewed and are negative.       Objective:    BP 138/84   Pulse 66   Temp 97.7 F (36.5 C) (Oral)   Ht 5' 6"  (1.676 m)   Wt 245 lb (111.1 kg)   BMI 39.54 kg/m    Wt Readings from Last 3 Encounters:    01/12/18 245 lb (111.1 kg)  12/30/17 243 lb 3.2 oz (110.3 kg)  12/25/17 245 lb (111.1 kg)    Physical Exam  Constitutional: She is oriented to person, place, and time. She appears well-developed and well-nourished. No distress.  HENT:  Head: Normocephalic and atraumatic.  Right Ear: Hearing, external ear and ear canal normal. A middle ear effusion is present.  Left Ear: Hearing, external ear and ear canal normal. A middle ear effusion is present.  Nose: Mucosal edema and rhinorrhea (clear) present. Right sinus exhibits no maxillary sinus tenderness and no frontal sinus tenderness. Left sinus exhibits no maxillary sinus tenderness and no frontal sinus tenderness.  Mouth/Throat: Uvula is midline, oropharynx is clear and moist and mucous membranes are normal.  Pale turbinates   Eyes: Pupils are equal, round, and reactive to light. Conjunctivae and EOM are normal.  Allergic shiners bilaterally, clear discharge  Cardiovascular: Normal rate, regular rhythm and normal heart sounds.  Pulmonary/Chest: Effort normal and breath sounds normal. She has no decreased breath sounds. She has no wheezes.  Lymphadenopathy:    She has no cervical adenopathy.  Neurological: She is alert and oriented to person, place, and time.  Skin: Skin is warm and dry. Capillary refill takes less than 2 seconds.  Psychiatric: She has a normal mood and affect. Her behavior is normal. Judgment and thought content normal.  Nursing note and vitals reviewed.      Assessment & Plan:   1. Acute bronchitis, unspecified organism Increase water intake, avoid triggers such as second hand smoke, add humidity to air, medications as prescribed.  - albuterol (PROVENTIL) (2.5 MG/3ML) 0.083% nebulizer solution; Take 3 mLs (2.5 mg total) by nebulization every 6 (six) hours as needed for wheezing or shortness of breath.  Dispense: 150 mL; Refill: 1 - albuterol (PROVENTIL HFA;VENTOLIN HFA) 108 (90 Base) MCG/ACT inhaler; Inhale 2 puffs  into the lungs every 6 (six) hours as needed for wheezing.  Dispense: 1 Inhaler; Refill: 0  2. Seasonal allergic rhinitis, unspecified trigger Avoid triggers. Medications as prescribed.  - fluticasone (FLONASE) 50 MCG/ACT nasal spray; Place 2 sprays into both nostrils daily.  Dispense: 16 g; Refill: 6 -  cetirizine (ZYRTEC) 10 MG tablet; Take 1 tablet (10 mg total) by mouth daily.  Dispense: 30 tablet; Refill: 11  3. Cough Increase water intake, avoid triggers, add humidity to air, hot tea with honey, cough drops or lozenges as needed.  - albuterol (PROVENTIL) (2.5 MG/3ML) 0.083% nebulizer solution; Take 3 mLs (2.5 mg total) by nebulization every 6 (six) hours as needed for wheezing or shortness of breath.  Dispense: 150 mL; Refill: 1 - albuterol (PROVENTIL HFA;VENTOLIN HFA) 108 (90 Base) MCG/ACT inhaler; Inhale 2 puffs into the lungs every 6 (six) hours as needed for wheezing.  Dispense: 1 Inhaler; Refill: 0   Continue all other maintenance medications.  Follow up plan: Return in about 4 weeks (around 02/09/2018), or if symptoms worsen or fail to improve.  Educational handout given for bronchitis, allergic rhinitis   The above assessment and management plan was discussed with the patient. The patient verbalized understanding of and has agreed to the management plan. Patient is aware to call the clinic if symptoms persist or worsen. Patient is aware when to return to the clinic for a follow-up visit. Patient educated on when it is appropriate to go to the emergency department.   Monia Pouch, FNP-C [facility] [phone]

## 2018-01-12 NOTE — Patient Instructions (Signed)
Acute Bronchitis, Adult Acute bronchitis is when air tubes (bronchi) in the lungs suddenly get swollen. The condition can make it hard to breathe. It can also cause these symptoms:  A cough.  Coughing up clear, yellow, or green mucus.  Wheezing.  Chest congestion.  Shortness of breath.  A fever.  Body aches.  Chills.  A sore throat.  Follow these instructions at home: Medicines  Take over-the-counter and prescription medicines only as told by your doctor.  If you were prescribed an antibiotic medicine, take it as told by your doctor. Do not stop taking the antibiotic even if you start to feel better. General instructions  Rest.  Drink enough fluids to keep your pee (urine) clear or pale yellow.  Avoid smoking and secondhand smoke. If you smoke and you need help quitting, ask your doctor. Quitting will help your lungs heal faster.  Use an inhaler, cool mist vaporizer, or humidifier as told by your doctor.  Keep all follow-up visits as told by your doctor. This is important. How is this prevented? To lower your risk of getting this condition again:  Wash your hands often with soap and water. If you cannot use soap and water, use hand sanitizer.  Avoid contact with people who have cold symptoms.  Try not to touch your hands to your mouth, nose, or eyes.  Make sure to get the flu shot every year.  Contact a doctor if:  Your symptoms do not get better in 2 weeks. Get help right away if:  You cough up blood.  You have chest pain.  You have very bad shortness of breath.  You become dehydrated.  You faint (pass out) or keep feeling like you are going to pass out.  You keep throwing up (vomiting).  You have a very bad headache.  Your fever or chills gets worse. This information is not intended to replace advice given to you by your health care provider. Make sure you discuss any questions you have with your health care provider. Document Released:  09/29/2007 Document Revised: 11/19/2015 Document Reviewed: 10/01/2015 Elsevier Interactive Patient Education  2018 [employer] Allergic Rhinitis, Adult Allergic rhinitis is an allergic reaction that affects the mucous membrane inside the nose. It causes sneezing, a runny or stuffy nose, and the feeling of mucus going down the back of the throat (postnasal drip). Allergic rhinitis can be mild to severe. There are two types of allergic rhinitis:  Seasonal. This type is also called hay fever. It happens only during certain seasons.  Perennial. This type can happen at any time of the year.  What are the causes? This condition happens when the body's defense system (immune system) responds to certain harmless substances called allergens as though they were germs.  Seasonal allergic rhinitis is triggered by pollen, which can come from grasses, trees, and weeds. Perennial allergic rhinitis may be caused by:  House dust mites.  Pet dander.  Mold spores.  What are the signs or symptoms? Symptoms of this condition include:  Sneezing.  Runny or stuffy nose (nasal congestion).  Postnasal drip.  Itchy nose.  Tearing of the eyes.  Trouble sleeping.  Daytime sleepiness.  How is this diagnosed? This condition may be diagnosed based on:  Your medical history.  A physical exam.  Tests to check for related conditions, such as: ? Asthma. ? Pink eye. ? Ear infection. ? Upper respiratory infection.  Tests to find out which allergens trigger your symptoms. These may include skin or blood  tests.  How is this treated? There is no cure for this condition, but treatment can help control symptoms. Treatment may include:  Taking medicines that block allergy symptoms, such as antihistamines. Medicine may be given as a shot, nasal spray, or pill.  Avoiding the allergen.  Desensitization. This treatment involves getting ongoing shots until your body becomes less sensitive to the  allergen. This treatment may be done if other treatments do not help.  If taking medicine and avoiding the allergen does not work, new, stronger medicines may be prescribed.  Follow these instructions at home:  Find out what you are allergic to. Common allergens include smoke, dust, and pollen.  Avoid the things you are allergic to. These are some things you can do to help avoid allergens: ? Replace carpet with wood, tile, or vinyl flooring. Carpet can trap dander and dust. ? Do not smoke. Do not allow smoking in your home. ? Change your heating and air conditioning filter at least once a month. ? During allergy season:  Keep windows closed as much as possible.  Plan outdoor activities when pollen counts are lowest. This is usually during the evening hours.  When coming indoors, change clothing and shower before sitting on furniture or bedding.  Take over-the-counter and prescription medicines only as told by your health care provider.  Keep all follow-up visits as told by your health care provider. This is important. Contact a health care provider if:  You have a fever.  You develop a persistent cough.  You make whistling sounds when you breathe (you wheeze).  Your symptoms interfere with your normal daily activities. Get help right away if:  You have shortness of breath. Summary  This condition can be managed by taking medicines as directed and avoiding allergens.  Contact your health care provider if you develop a persistent cough or fever.  During allergy season, keep windows closed as much as possible. This information is not intended to replace advice given to you by your health care provider. Make sure you discuss any questions you have with your health care provider. Document Released: 01/05/2001 Document Revised: 05/20/2016 Document Reviewed: 05/20/2016 Elsevier Interactive Patient Education  2018 Elsevier Inc.  

## 2018-01-26 ENCOUNTER — Encounter: Payer: Medicare HMO | Admitting: *Deleted

## 2018-02-03 ENCOUNTER — Ambulatory Visit (INDEPENDENT_AMBULATORY_CARE_PROVIDER_SITE_OTHER): Payer: Medicare HMO | Admitting: Pediatrics

## 2018-02-03 ENCOUNTER — Encounter: Payer: Self-pay | Admitting: Pediatrics

## 2018-02-03 VITALS — BP 134/83 | HR 61 | Temp 97.9°F | Ht 66.0 in | Wt 242.0 lb

## 2018-02-03 DIAGNOSIS — I10 Essential (primary) hypertension: Secondary | ICD-10-CM | POA: Diagnosis not present

## 2018-02-03 DIAGNOSIS — E785 Hyperlipidemia, unspecified: Secondary | ICD-10-CM | POA: Diagnosis not present

## 2018-02-03 DIAGNOSIS — M545 Low back pain, unspecified: Secondary | ICD-10-CM

## 2018-02-03 DIAGNOSIS — E559 Vitamin D deficiency, unspecified: Secondary | ICD-10-CM | POA: Diagnosis not present

## 2018-02-03 DIAGNOSIS — G8929 Other chronic pain: Secondary | ICD-10-CM

## 2018-02-03 DIAGNOSIS — R7303 Prediabetes: Secondary | ICD-10-CM

## 2018-02-03 DIAGNOSIS — F339 Major depressive disorder, recurrent, unspecified: Secondary | ICD-10-CM

## 2018-02-03 DIAGNOSIS — K219 Gastro-esophageal reflux disease without esophagitis: Secondary | ICD-10-CM | POA: Diagnosis not present

## 2018-02-03 MED ORDER — MELOXICAM 7.5 MG PO TABS: 7.5000 mg | ORAL_TABLET | Freq: Every day | ORAL | 1 refills | Status: DC | PRN

## 2018-02-03 MED ORDER — PANTOPRAZOLE SODIUM 40 MG PO TBEC: 40.0000 mg | DELAYED_RELEASE_TABLET | Freq: Every day | ORAL | 1 refills | Status: DC

## 2018-02-03 MED ORDER — PRAVASTATIN SODIUM 80 MG PO TABS: 80.0000 mg | ORAL_TABLET | Freq: Every day | ORAL | 1 refills | Status: DC

## 2018-02-03 MED ORDER — LISINOPRIL 10 MG PO TABS: 10.0000 mg | ORAL_TABLET | Freq: Every day | ORAL | 1 refills | Status: DC

## 2018-02-03 MED ORDER — METFORMIN HCL 500 MG PO TABS: ORAL_TABLET | ORAL | 1 refills | Status: DC

## 2018-02-03 MED ORDER — SERTRALINE HCL 100 MG PO TABS: 100.0000 mg | ORAL_TABLET | Freq: Every day | ORAL | 1 refills | Status: DC

## 2018-02-03 MED ORDER — FUROSEMIDE 20 MG PO TABS: 20.0000 mg | ORAL_TABLET | Freq: Every day | ORAL | 1 refills | Status: DC | PRN

## 2018-02-03 NOTE — Progress Notes (Signed)
Subjective:   Patient ID: Allison Rollins, female    DOB: February 02, 1951, 67 y.o.   MRN: [id number] CC: Medical Management of Chronic Issues  HPI: Allison Rollins is a 67 y.o. female   Prediabetes: Avoiding sugary foods.   HTN: Taking medicine regularly.  No side effects.  GERD: Minimal symptoms as long as she takes medicine regularly.  Depressed mood: Increased stress at home, mother's house is getting sold.   HLD: Taking medicine regularly, no side effects.  Vitamin D deficiency: Has been taking high-dose vitamin D.  Discussed should be able to switch to over-the-counter vitamin D.  Relevant past medical, surgical, family and social history reviewed. Allergies and medications reviewed and updated. Social History   Tobacco Use  Smoking Status Former Smoker  . Packs/day: 0.25  . Years: 30.00  . Pack years: 7.50  . Types: Cigarettes  . Start date: 01/04/1965  . Last attempt to quit: 01/03/1995  . Years since quitting: 23.1  Smokeless Tobacco Never Used   ROS: Per HPI   Objective:    BP 134/83   Pulse 61   Temp 97.9 F (36.6 C) (Oral)   Ht _0  (1.676 m)   Wt 242 lb (109.8 kg)   BMI 39.06 kg/m   Wt Readings from Last 3 Encounters:  02/03/18 242 lb (109.8 kg)  01/12/18 245 lb (111.1 kg)  12/30/17 243 lb 3.2 oz (110.3 kg)    Gen: NAD, alert, cooperative with exam, NCAT EYES: EOMI, no conjunctival injection, or no icterus ENT:  OP without erythema LYMPH: no cervical LAD CV: NRRR, normal S1/S2 Resp: CTABL, no wheezes, normal WOB Abd: +BS, soft, NTND. no guarding Ext: No pitting edema, warm Neuro: Alert and oriented, strength equal b/l UE and LE, coordination grossly normal MSK: normal muscle bulk  Assessment & Plan:  Allison Rollins was seen today for medical management of chronic issues.  Diagnoses and all orders for this visit:  Vitamin D deficiency We will recheck vitamin D level.  If at goal, stop high-dose vitamin D start over-the-counter vitamin D -     VITAMIN D 25  Hydroxy (Vit-D Deficiency, Fractures)  Recurrent major depressive disorder, remission status unspecified ([place]) Ongoing symptoms.  Will increase sertraline to 100 mg.  Feels safe at home.  Given information for virtual behavioral health.  Has talked with them in the past.  She declines referral right now -     sertraline (ZOLOFT) 100 MG tablet; Take 1 tablet (100 mg total) by mouth daily.  Gastroesophageal reflux disease, esophagitis presence not specified Stable, continue below -     pantoprazole (PROTONIX) 40 MG tablet; Take 1 tablet (40 mg total) by mouth daily.  Prediabetes If A1c remains at goal, may be able to DC below. -     metFORMIN (GLUCOPHAGE) 500 MG tablet; TAKE 1 TABLET ONE TIME DAILY WITH BREAKFAST  Chronic bilateral low back pain without sciatica Stable, continue below -     meloxicam (MOBIC) 7.5 MG tablet; Take 1 tablet (7.5 mg total) by mouth daily as needed for pain.  Benign hypertension Stable, continue below -     lisinopril (PRINIVIL,ZESTRIL) 10 MG tablet; Take 1 tablet (10 mg total) by mouth daily. -     furosemide (LASIX) 20 MG tablet; Take 1 tablet (20 mg total) by mouth daily as needed. -     BMP8+EGFR  Hyperlipidemia, unspecified hyperlipidemia type Stable, continue below -     pravastatin (PRAVACHOL) 80 MG tablet; Take 1 tablet (80 mg  total) by mouth daily.   Follow up plan: No follow-ups on file. Assunta Found, MD [place]

## 2018-02-03 NOTE — Patient Instructions (Signed)
[facility] Contact: [phone]  

## 2018-02-04 ENCOUNTER — Other Ambulatory Visit: Payer: Self-pay | Admitting: Pediatrics

## 2018-02-04 DIAGNOSIS — I10 Essential (primary) hypertension: Secondary | ICD-10-CM

## 2018-02-04 DIAGNOSIS — G8929 Other chronic pain: Secondary | ICD-10-CM

## 2018-02-04 DIAGNOSIS — M545 Low back pain, unspecified: Secondary | ICD-10-CM

## 2018-02-04 DIAGNOSIS — K219 Gastro-esophageal reflux disease without esophagitis: Secondary | ICD-10-CM

## 2018-02-04 LAB — BMP8+EGFR
BUN/Creatinine Ratio: 11 — ABNORMAL LOW (ref 12–28)
BUN: 10 mg/dL (ref 8–27)
CO2: 23 mmol/L (ref 20–29)
Calcium: 9.3 mg/dL (ref 8.7–10.3)
Chloride: 102 mmol/L (ref 96–106)
Creatinine, Ser: 0.87 mg/dL (ref 0.57–1.00)
GFR calc Af Amer: 79 mL/min/{1.73_m2} (ref >59)
GFR calc non Af Amer: 69 mL/min/{1.73_m2} (ref >59)
Glucose: 132 mg/dL — ABNORMAL HIGH (ref 65–99)
Potassium: 4.2 mmol/L (ref 3.5–5.2)
Sodium: 143 mmol/L (ref 134–144)

## 2018-02-04 LAB — VITAMIN D 25 HYDROXY (VIT D DEFICIENCY, FRACTURES): Vit D, 25-Hydroxy: 40.4 ng/mL (ref 30.0–100.0)

## 2018-02-06 ENCOUNTER — Other Ambulatory Visit: Payer: Self-pay | Admitting: *Deleted

## 2018-02-06 ENCOUNTER — Other Ambulatory Visit: Payer: Self-pay | Admitting: Pediatrics

## 2018-02-06 MED ORDER — VITAMIN D 1000 UNITS PO TABS: 1000.0000 [IU] | ORAL_TABLET | Freq: Every day | ORAL | 0 refills | Status: DC

## 2018-02-08 ENCOUNTER — Encounter: Payer: Medicare HMO | Admitting: *Deleted

## 2018-02-16 ENCOUNTER — Other Ambulatory Visit: Payer: Self-pay | Admitting: *Deleted

## 2018-02-16 DIAGNOSIS — E785 Hyperlipidemia, unspecified: Secondary | ICD-10-CM

## 2018-02-16 MED ORDER — PRAVASTATIN SODIUM 80 MG PO TABS: 80.0000 mg | ORAL_TABLET | Freq: Every day | ORAL | 1 refills | Status: DC

## 2018-02-20 ENCOUNTER — Encounter: Payer: Self-pay | Admitting: *Deleted

## 2018-02-20 ENCOUNTER — Other Ambulatory Visit: Payer: Self-pay | Admitting: Physician Assistant

## 2018-02-20 ENCOUNTER — Ambulatory Visit (INDEPENDENT_AMBULATORY_CARE_PROVIDER_SITE_OTHER): Payer: Medicare HMO | Admitting: *Deleted

## 2018-02-20 VITALS — BP 139/85 | HR 54 | Temp 97.5°F | Ht 66.0 in | Wt 240.0 lb

## 2018-02-20 DIAGNOSIS — N3001 Acute cystitis with hematuria: Secondary | ICD-10-CM

## 2018-02-20 DIAGNOSIS — Z0000 Encounter for general adult medical examination without abnormal findings: Secondary | ICD-10-CM | POA: Diagnosis not present

## 2018-02-20 DIAGNOSIS — R300 Dysuria: Secondary | ICD-10-CM | POA: Diagnosis not present

## 2018-02-20 LAB — URINALYSIS, ROUTINE W REFLEX MICROSCOPIC
Bilirubin, UA: NEGATIVE
Nitrite, UA: POSITIVE — AB
Specific Gravity, UA: 1.025 (ref 1.005–1.030)
Urobilinogen, Ur: 2 mg/dL — ABNORMAL HIGH (ref 0.2–1.0)
pH, UA: 6 (ref 5.0–7.5)

## 2018-02-20 LAB — MICROSCOPIC EXAMINATION
Renal Epithel, UA: NONE SEEN /hpf
WBC, UA: >30 /hpf — AB (ref 0–5)

## 2018-02-20 MED ORDER — NITROFURANTOIN MONOHYD MACRO 100 MG PO CAPS: 100.0000 mg | ORAL_CAPSULE | Freq: Two times a day (BID) | ORAL | 0 refills | Status: DC

## 2018-02-20 NOTE — Patient Instructions (Addendum)
Please work on your goal of increasing your exercise to 3 times per week for 30 minutes each session.   You have been scheduled for an eye exam at [facility] on Monday 03/13/18 at 9:40 am.   You have been scheduled for your mammogram on March 21, 2018 at 2:40 pm on the mobile unit here at [place].   Please review the information on Advance Directives, and if you complete these please bring a copy to our office to be filed in your medical record.    Thank you for coming in for your Annual Wellness Visit today!!   Preventive Care 68 Years and Older, Female Preventive care refers to lifestyle choices and visits with your health care provider that can promote health and wellness. What does preventive care include?  A yearly physical exam. This is also called an annual well check.  Dental exams once or twice a year.  Routine eye exams. Ask your health care provider how often you should have your eyes checked.  Personal lifestyle choices, including: ? Daily care of your teeth and gums. ? Regular physical activity. ? Eating a healthy diet. ? Avoiding tobacco and drug use. ? Limiting alcohol use. ? Practicing safe sex. ? Taking low-dose aspirin every day. ? Taking vitamin and mineral supplements as recommended by your health care provider. What happens during an annual well check? The services and screenings done by your health care provider during your annual well check will depend on your age, overall health, lifestyle risk factors, and family history of disease. Counseling Your health care provider may ask you questions about your:  Alcohol use.  Tobacco use.  Drug use.  Emotional well-being.  Home and relationship well-being.  Sexual activity.  Eating habits.  History of falls.  Memory and ability to understand (cognition).  Work and work Statistician.  Reproductive health.  Screening You may have the following tests or  measurements:  Height, weight, and BMI.  Blood pressure.  Lipid and cholesterol levels. These may be checked every 5 years, or more frequently if you are over 58 years old.  Skin check.  Lung cancer screening. You may have this screening every year starting at age 90 if you have a 30-pack-year history of smoking and currently smoke or have quit within the past 15 years.  Fecal occult blood test (FOBT) of the stool. You may have this test every year starting at age 59.  Flexible sigmoidoscopy or colonoscopy. You may have a sigmoidoscopy every 5 years or a colonoscopy every 10 years starting at age 60.  Hepatitis C blood test.  Hepatitis B blood test.  Sexually transmitted disease (STD) testing.  Diabetes screening. This is done by checking your blood sugar (glucose) after you have not eaten for a while (fasting). You may have this done every 1-3 years.  Bone density scan. This is done to screen for osteoporosis. You may have this done starting at age 6.  Mammogram. This may be done every 1-2 years. Talk to your health care provider about how often you should have regular mammograms.  Talk with your health care provider about your test results, treatment options, and if necessary, the need for more tests. Vaccines Your health care provider may recommend certain vaccines, such as:  Influenza vaccine. This is recommended every year.  Tetanus, diphtheria, and acellular pertussis (Tdap, Td) vaccine. You may need a Td booster every 10 years.  Varicella vaccine. You may need this if you have  not been vaccinated.  Zoster vaccine. You may need this after age 49.  Measles, mumps, and rubella (MMR) vaccine. You may need at least one dose of MMR if you were born in 1957 or later. You may also need a second dose.  Pneumococcal 13-valent conjugate (PCV13) vaccine. One dose is recommended after age 39.  Pneumococcal polysaccharide (PPSV23) vaccine. One dose is recommended after age  73.  Meningococcal vaccine. You may need this if you have certain conditions.  Hepatitis A vaccine. You may need this if you have certain conditions or if you travel or work in places where you may be exposed to hepatitis A.  Hepatitis B vaccine. You may need this if you have certain conditions or if you travel or work in places where you may be exposed to hepatitis B.  Haemophilus influenzae type b (Hib) vaccine. You may need this if you have certain conditions.  Talk to your health care provider about which screenings and vaccines you need and how often you need them. This information is not intended to replace advice given to you by your health care provider. Make sure you discuss any questions you have with your health care provider. Document Released: 05/09/2015 Document Revised: 12/31/2015 Document Reviewed: 02/11/2015 Elsevier Interactive Patient Education  [employer].

## 2018-02-20 NOTE — Progress Notes (Signed)
Subjective:   Allison Rollins is a 67 y.o. female who presents for Medicare Annual (Subsequent) preventive examination.  Allison Rollins is retired from [employer].  She enjoys watching her grandchildren play sports and attending church.  She has 2 sons, and 5 grandchildren.  Her husband passed away a few years ago, she lives alone and has an outside cat.  She complains today of pressure with urination for the past 3 days.  Will check with the acute provider regarding ordering a urinalysis for her.    Review of Systems:   Genitourinary- pressure with urination  Cardiac Risk Factors include: advanced age (>36mn, >>37women);diabetes mellitus;dyslipidemia;hypertension;sedentary lifestyle;obesity (BMI >30kg/m2)     Objective:     Vitals: BP 139/85   Pulse (!) 54   Temp (!) 97.5 F (36.4 C)   Ht 5' 6" (1.676 m)   Wt 240 lb (108.9 kg)   BMI 38.74 kg/m   Body mass index is 38.74 kg/m.  Advanced Directives 02/20/2018 12/25/2017 03/31/2017 09/30/2016 05/10/2016 12/22/2015 12/22/2015  Does Patient Have a Medical Advance Directive? _0  Yes No  Type of Advance Directive - - - - - Living will -  Does patient want to make changes to medical advance directive? - - - - - No - Patient declined -  Would patient like information on creating a medical advance directive? Yes (MAU/Ambulatory/Procedural Areas - Information given) - - No - Patient declined No - Patient declined - -  Pre-existing out of facility DNR order (yellow form or pink MOST form) - - - - - - -    Tobacco Social History   Tobacco Use  Smoking Status Former Smoker  . Packs/day: 0.25  . Years: 30.00  . Pack years: 7.50  . Types: Cigarettes  . Start date: 01/04/1965  . Last attempt to quit: 01/03/1995  . Years since quitting: 23.1  Smokeless Tobacco Never Used       Clinical Intake:     Pain Score: 0-No pain                 Past Medical History:  Diagnosis Date  . Anxiety   . Asthma   .  CHF (congestive heart failure) ([facility]   . COPD (chronic obstructive pulmonary disease) ([facility]   . Depression   . Diabetes mellitus without complication ([facility] [place] [facility]   . HTN (hypertension)   . Hypoglycemia   . Kidney stones   . Parkinson's disease   . Pneumonia   . S/P colonoscopy 10/15/04   normal   Past Surgical History:  Procedure Laterality Date  . CHOLECYSTECTOMY    . COLONOSCOPY  10/15/2004   Normal rectum/Normal colon  . COLONOSCOPY N/A 05/10/2016   Procedure: COLONOSCOPY;  Surgeon: SDanie Binder MD;  [facility];  Service: Endoscopy;  Laterality: N/A;  10:30  . ESOPHAGOGASTRODUODENOSCOPY  09/11/2010   Dysphagia likely multifactorial (possible Candida esophagitis, likely nonspecific esophageal motility disorder, and/or uncontrolled gastroesophageal reflux disease), status post empiric dilation  . KIDNEY STONE SURGERY     Family History  Problem Relation Age of Onset  . Coronary artery disease Father   . COPD Mother   . Asthma Brother   . Coronary artery disease Sister   . Diabetes Sister   . Coronary artery disease Brother   . Coronary artery disease Sister    Social History   Socioeconomic History  . Marital status: Widowed    Spouse name: Not on file  .  Number of children: 2  . Years of education: Not on file  . Highest education level: Not on file  Occupational History  . Occupation: disabled  Social Needs  . Financial resource strain: Not on file  . Food insecurity:    Worry: Not on file    Inability: Not on file  . Transportation needs:    Medical: Not on file    Non-medical: Not on file  Tobacco Use  . Smoking status: Former Smoker    Packs/day: 0.25    Years: 30.00    Pack years: 7.50    Types: Cigarettes    Start date: 01/04/1965    Last attempt to quit: 01/03/1995    Years since quitting: 23.1  . Smokeless tobacco: Never Used  Substance and Sexual Activity  . Alcohol use: No    Alcohol/week: 0.0 standard drinks  . Drug use: No  . Sexual  activity: Not on file  Lifestyle  . Physical activity:    Days per week: Not on file    Minutes per session: Not on file  . Stress: Not on file  Relationships  . Social connections:    Talks on phone: Not on file    Gets together: Not on file    Attends religious service: Not on file    Active member of club or organization: Not on file    Attends meetings of clubs or organizations: Not on file    Relationship status: Not on file  Other Topics Concern  . Not on file  Social History Narrative  . Not on file    Outpatient Encounter Medications as of 02/20/2018  Medication Sig  . acetaminophen (TYLENOL) 500 MG tablet Take 500 mg by mouth every 6 (six) hours as needed for moderate pain.   Marland Kitchen albuterol (PROVENTIL HFA;VENTOLIN HFA) 108 (90 Base) MCG/ACT inhaler Inhale 2 puffs into the lungs every 6 (six) hours as needed for wheezing.  Marland Kitchen albuterol (PROVENTIL) (2.5 MG/3ML) 0.083% nebulizer solution Take 3 mLs (2.5 mg total) by nebulization every 6 (six) hours as needed for wheezing or shortness of breath.  Marland Kitchen aspirin EC 81 MG tablet Take 1 tablet (81 mg total) by mouth daily.  . blood glucose meter kit and supplies Accu-check Nano smartview meter  . cetirizine (ZYRTEC) 10 MG tablet Take 1 tablet (10 mg total) by mouth daily.  . cholecalciferol (VITAMIN D) 1000 units tablet Take 1 tablet (1,000 Units total) by mouth daily.  . fluticasone (FLONASE) 50 MCG/ACT nasal spray Place 2 sprays into both nostrils daily.  . furosemide (LASIX) 20 MG tablet Take 1 tablet (20 mg total) by mouth daily as needed.  . gabapentin (NEURONTIN) 300 MG capsule Take 1-2 capsules (300-600 mg total) by mouth 2 (two) times daily. Take 39m in the morning, 6083mat night  . lisinopril (PRINIVIL,ZESTRIL) 10 MG tablet TAKE 1 TABLET EVERY DAY  . meloxicam (MOBIC) 7.5 MG tablet TAKE 1 TABLET (7.5 MG TOTAL) BY MOUTH DAILY AS NEEDED FOR PAIN.  . metFORMIN (GLUCOPHAGE) 500 MG tablet TAKE 1 TABLET ONE TIME DAILY WITH BREAKFAST    . nitroGLYCERIN (NITROSTAT) 0.4 MG SL tablet Place 1 tablet (0.4 mg total) under the tongue every 5 (five) minutes as needed for chest pain.  . Omega-3 Fatty Acids (FISH OIL PO) Take 1 capsule by mouth daily.   . pantoprazole (PROTONIX) 40 MG tablet TAKE 1 TABLET EVERY DAY  . pramipexole (MIRAPEX) 0.25 MG tablet Take 2 tablets (0.5 mg total) by mouth at  bedtime.  . pravastatin (PRAVACHOL) 80 MG tablet Take 1 tablet (80 mg total) by mouth daily.  . sertraline (ZOLOFT) 100 MG tablet Take 1 tablet (100 mg total) by mouth daily.  . sertraline (ZOLOFT) 50 MG tablet TAKE 1 TABLET (50 MG TOTAL) DAILY   . triamcinolone (KENALOG) 0.025 % ointment Apply 1 application topically 2 (two) times daily.  . TRUE METRIX BLOOD GLUCOSE TEST test strip TEST BLOOD SUGAR ONE TIME DAILY   No facility-administered encounter medications on file as of 02/20/2018.     Activities of Daily Living In your present state of health, do you have any difficulty performing the following activities: 02/20/2018  Hearing? N  Vision? N  Difficulty concentrating or making decisions? N  Walking or climbing stairs? Y  Comment due to back pain  Dressing or bathing? N  Doing errands, shopping? N  Preparing Food and eating ? N  Using the Toilet? N  In the past six months, have you accidently leaked urine? N  Do you have problems with loss of bowel control? N  Managing your Medications? N  Managing your Finances? N  Housekeeping or managing your Housekeeping? N  Some recent data might be hidden    Patient Care Team: Eustaquio Maize, MD as PCP - General (Pediatrics) Danie Binder, MD (Gastroenterology) Phillips Odor, MD as Consulting Physician (Neurology) Harl Bowie Alphonse Guild, MD as Consulting Physician (Cardiology)    Assessment:   This is a routine wellness examination for [facility].  Exercise Activities and Dietary recommendations  Patient states she usually eats 2 meals per day and a snack as needed.  She usually eats  fruit for breakfast, a sandwich for lunch, and protein and vegetables, or homemade soup for supper.  She states she uses resources such as [address] or the [employer] as needed for help with having adequate food.  She states she feels she has all the resources she needs for food.    Current Exercise Habits: The patient does not participate in regular exercise at present, Exercise limited by: orthopedic condition(s)  Goals    . Exercise 3x per week (30 min per time)     Exercise at the [employer] 3 times per week for 30 minutes each session.       Fall Risk Fall Risk  02/20/2018 02/03/2018 01/12/2018 12/30/2017 07/28/2017  Falls in the past year? _0   Comment - - - - -  Number falls in past yr: - - - - -  Comment - - - - -  Injury with Fall? - - - - -  Risk for fall due to : - - - - -  Follow up - - - - -   Is the patient's home free of loose throw rugs in walkways, pet beds, electrical cords, etc?   yes      Grab bars in the bathroom? no      Handrails on the stairs?   no stairs in home      Adequate lighting?   yes   Depression Screen PHQ 2/9 Scores 02/20/2018 02/03/2018 01/12/2018 12/30/2017  PHQ - 2 Score _1 PHQ- 9 Score - 6 - 9  Exception Documentation - - - -  Not completed - - - -     Cognitive Function MMSE - Mini Mental State Exam 02/20/2018 09/30/2016  Orientation to time 3 4  Orientation to Place 5 4  Registration 3 3  Attention/ Calculation 4 5  Recall 3 -  Language- name 2 objects 2 2  Language- repeat 1 1  Language- follow 3 step command 3 3  Language- read & follow direction 1 1  Write a sentence 1 1  Copy design 1 1  Total score 27 -        Immunization History  Administered Date(s) Administered  . Influenza, High Dose Seasonal PF 02/02/2015, 02/09/2016, 01/13/2017, 01/07/2018  . Pneumococcal Conjugate-13 01/05/2015  . Pneumococcal Polysaccharide-23 02/09/2016  . Zoster 01/05/2015    Qualifies for  Shingles Vaccine? Declined today  Screening Tests Health Maintenance  Topic Date Due  . OPHTHALMOLOGY EXAM  01/26/2018  . TETANUS/TDAP  07/29/2018 (Originally 01/04/1970)  . MAMMOGRAM  02/04/2019 (Originally 12/16/2017)  . HEMOGLOBIN A1C  05/06/2018  . FOOT EXAM  07/29/2018  . COLONOSCOPY  05/10/2026  . INFLUENZA VACCINE  Completed  . DEXA SCAN  Completed  . Hepatitis C Screening  Completed  . PNA vac Low Risk Adult  Completed   Eye exam scheduled for 03/21/18 at [facility]. Mammogram scheduled for 03/21/18. Tdap declined today.  Cancer Screenings: Lung: Low Dose CT Chest recommended if Age 38-80 years, 30 pack-year currently smoking OR have quit w/in 15years. Patient does not qualify. Breast:  Up to date on Mammogram? No - scheduled for 03/21/18 Up to date of Bone Density/Dexa? Yes Colorectal: yes, last completed 05/10/16  Additional Screenings:  Hepatitis C Screening:  Recommend at next visit with Dr. Evette Doffing.     Plan:     Work on your goal of increasing your exercise to 3 times per week for 30 minutes each session.  Go for your eye exam at [facility] on Monday 03/13/18 at 9:40 am.  Have mammogram done on March 21, 2018 at 2:40 pm on the mobile unit here at [place].  Review the information on Advance Directives, and if you complete these please bring a copy to our office to be filed in your medical record.     I have personally reviewed and noted the following in the patient's chart:   . Medical and social history . Use of alcohol, tobacco or illicit drugs  . Current medications and supplements . Functional ability and status . Nutritional status . Physical activity . Advanced directives . List of other physicians . Hospitalizations, surgeries, and ER visits in previous 12 months . Vitals . Screenings to include cognitive, depression, and falls . Referrals and appointments  In addition, I have reviewed and discussed with patient  certain preventive protocols, quality metrics, and best practice recommendations. A written personalized care plan for preventive services as well as general preventive health recommendations were provided to patient.     WYATT, AMY M, RN  02/20/2018   I have reviewed and agree with the above AWV documentation.   Terald Sleeper PA-C [place] [address]  [place], [place] [address] [phone]

## 2018-02-21 ENCOUNTER — Telehealth: Payer: Self-pay | Admitting: *Deleted

## 2018-02-21 NOTE — Telephone Encounter (Signed)
Left patient a voicemail 02/20/18 at 5 pm and today at 2:40 pm notifying her that Macrobid was sent in to pharmacy for her.  Asked her to return call to let me know she got the message.

## 2018-02-22 LAB — URINE CULTURE

## 2018-02-22 NOTE — Telephone Encounter (Signed)
Pt aware.

## 2018-02-22 NOTE — Telephone Encounter (Signed)
Yes, that's fine 

## 2018-02-22 NOTE — Telephone Encounter (Signed)
Pt is calling to find out about her urine culture, she has the macrobid rx,. Is that ok since culture is back

## 2018-02-28 ENCOUNTER — Other Ambulatory Visit: Payer: Self-pay | Admitting: *Deleted

## 2018-02-28 DIAGNOSIS — G629 Polyneuropathy, unspecified: Secondary | ICD-10-CM

## 2018-03-01 ENCOUNTER — Other Ambulatory Visit: Payer: Self-pay | Admitting: Pediatrics

## 2018-03-01 DIAGNOSIS — R7303 Prediabetes: Secondary | ICD-10-CM

## 2018-03-02 MED ORDER — GABAPENTIN 300 MG PO CAPS: ORAL_CAPSULE | ORAL | 0 refills | Status: DC

## 2018-03-07 ENCOUNTER — Other Ambulatory Visit: Payer: Self-pay | Admitting: *Deleted

## 2018-03-07 DIAGNOSIS — G629 Polyneuropathy, unspecified: Secondary | ICD-10-CM

## 2018-03-07 NOTE — Telephone Encounter (Signed)
Opened in error. Refilled on 02/28/18

## 2018-03-13 DIAGNOSIS — H25813 Combined forms of age-related cataract, bilateral: Secondary | ICD-10-CM | POA: Diagnosis not present

## 2018-03-13 DIAGNOSIS — E119 Type 2 diabetes mellitus without complications: Secondary | ICD-10-CM | POA: Diagnosis not present

## 2018-03-13 DIAGNOSIS — Z7984 Long term (current) use of oral hypoglycemic drugs: Secondary | ICD-10-CM | POA: Diagnosis not present

## 2018-03-13 LAB — HM DIABETES EYE EXAM

## 2018-03-27 ENCOUNTER — Other Ambulatory Visit: Payer: Self-pay | Admitting: Pediatrics

## 2018-03-27 DIAGNOSIS — I10 Essential (primary) hypertension: Secondary | ICD-10-CM

## 2018-03-29 ENCOUNTER — Other Ambulatory Visit: Payer: Self-pay | Admitting: *Deleted

## 2018-03-29 MED ORDER — PRAMIPEXOLE DIHYDROCHLORIDE 0.25 MG PO TABS: 0.5000 mg | ORAL_TABLET | Freq: Every day | ORAL | 0 refills | Status: DC

## 2018-04-13 ENCOUNTER — Other Ambulatory Visit: Payer: Self-pay | Admitting: Pediatrics

## 2018-04-13 DIAGNOSIS — M545 Low back pain, unspecified: Secondary | ICD-10-CM

## 2018-04-13 DIAGNOSIS — G8929 Other chronic pain: Secondary | ICD-10-CM

## 2018-04-13 DIAGNOSIS — K219 Gastro-esophageal reflux disease without esophagitis: Secondary | ICD-10-CM

## 2018-05-03 ENCOUNTER — Other Ambulatory Visit: Payer: Self-pay | Admitting: Pediatrics

## 2018-05-03 DIAGNOSIS — G629 Polyneuropathy, unspecified: Secondary | ICD-10-CM

## 2018-05-03 DIAGNOSIS — R7303 Prediabetes: Secondary | ICD-10-CM

## 2018-05-03 DIAGNOSIS — I10 Essential (primary) hypertension: Secondary | ICD-10-CM

## 2018-05-08 ENCOUNTER — Encounter: Payer: Self-pay | Admitting: Pediatrics

## 2018-05-08 ENCOUNTER — Ambulatory Visit (INDEPENDENT_AMBULATORY_CARE_PROVIDER_SITE_OTHER): Payer: Medicare HMO | Admitting: Pediatrics

## 2018-05-08 VITALS — BP 130/85 | HR 53 | Temp 97.2°F | Ht 66.0 in | Wt 233.8 lb

## 2018-05-08 DIAGNOSIS — K219 Gastro-esophageal reflux disease without esophagitis: Secondary | ICD-10-CM | POA: Diagnosis not present

## 2018-05-08 DIAGNOSIS — G629 Polyneuropathy, unspecified: Secondary | ICD-10-CM | POA: Diagnosis not present

## 2018-05-08 DIAGNOSIS — G2581 Restless legs syndrome: Secondary | ICD-10-CM | POA: Diagnosis not present

## 2018-05-08 DIAGNOSIS — I10 Essential (primary) hypertension: Secondary | ICD-10-CM | POA: Diagnosis not present

## 2018-05-08 DIAGNOSIS — E1142 Type 2 diabetes mellitus with diabetic polyneuropathy: Secondary | ICD-10-CM | POA: Diagnosis not present

## 2018-05-08 DIAGNOSIS — F339 Major depressive disorder, recurrent, unspecified: Secondary | ICD-10-CM

## 2018-05-08 LAB — BAYER DCA HB A1C WAIVED: HB A1C (BAYER DCA - WAIVED): 5.7 % (ref <7.0)

## 2018-05-08 MED ORDER — SERTRALINE HCL 50 MG PO TABS: 50.0000 mg | ORAL_TABLET | Freq: Every day | ORAL | 1 refills | Status: DC

## 2018-05-08 MED ORDER — LISINOPRIL 10 MG PO TABS: 10.0000 mg | ORAL_TABLET | Freq: Every day | ORAL | 1 refills | Status: DC

## 2018-05-08 MED ORDER — PANTOPRAZOLE SODIUM 40 MG PO TBEC: 40.0000 mg | DELAYED_RELEASE_TABLET | Freq: Every day | ORAL | 1 refills | Status: DC

## 2018-05-08 MED ORDER — PRAMIPEXOLE DIHYDROCHLORIDE 0.25 MG PO TABS: 0.5000 mg | ORAL_TABLET | Freq: Every day | ORAL | 1 refills | Status: DC

## 2018-05-08 NOTE — Progress Notes (Signed)
  Subjective:   Patient ID: Allison HippoGail R Sedivy, female    DOB: 02-07-1951, 68 y.o.   MRN: [id number] CC: Medical Management of Chronic Issues  HPI: Allison Rollins is a 68 y.o. female   Neuropathy: takes gabapentin, helps some with symptoms  Hypertension: taking meds regularly, no lightheadedness, SOB  Restless leg: improved on pramipexole, sleeping ok  Depression: mood has been doing ok, feeling safe at home. Thinks sertraline has been helping  Type 2 diabetes: taking metformin daily. Trying to avoid sugary foods  Relevant past medical, surgical, family and social history reviewed. Allergies and medications reviewed and updated. Social History   Tobacco Use  Smoking Status Former Smoker  . Packs/day: 0.25  . Years: 30.00  . Pack years: 7.50  . Types: Cigarettes  . Start date: 01/04/1965  . Last attempt to quit: 01/03/1995  . Years since quitting: 23.3  Smokeless Tobacco Never Used   ROS: Per HPI   Objective:    BP 130/85   Pulse (!) 53   Temp (!) 97.2 F (36.2 C) (Oral)   Ht 5\' 6"  (1.676 m)   Wt 233 lb 12.8 oz (106.1 kg)   BMI 37.74 kg/m   Wt Readings from Last 3 Encounters:  05/08/18 233 lb 12.8 oz (106.1 kg)  02/20/18 240 lb (108.9 kg)  02/03/18 242 lb (109.8 kg)    Gen: NAD, alert, cooperative with exam, NCAT EYES: EOMI, no conjunctival injection, or no icterus ENT:  OP without erythema LYMPH: no cervical LAD CV: NRRR, normal S1/S2, no murmur, distal pulses 2+ b/l Resp: CTABL, no wheezes, normal WOB Abd: +BS, soft, NTND. no guarding or organomegaly Ext: No edema, warm Neuro: Alert and oriented, strength equal b/l UE and LE, coordination grossly normal MSK: normal muscle bulk  Assessment & Plan:  Dondra SpryGail was seen today for medical management of chronic issues.  Diagnoses and all orders for this visit:  Type 2 diabetes mellitus with diabetic polyneuropathy, without long-term current use of insulin (HCC) A1c 5.7. will try stopping metformin, repeat A1c next visit.  Continue avoiding sugary foods. Has lost almost 10 lbs over past 3 months. -     Bayer DCA Hb A1c Waived  Gastroesophageal reflux disease, esophagitis presence not specified Stable, cont below -     pantoprazole (PROTONIX) 40 MG tablet; Take 1 tablet (40 mg total) by mouth daily.  Neuropathy Stable, cont gabapentin  Benign hypertension Stable, labs today, cont current medicine -     Basic Metabolic Panel -     lisinopril (PRINIVIL,ZESTRIL) 10 MG tablet; Take 1 tablet (10 mg total) by mouth daily.  Recurrent major depressive disorder, remission status unspecified (HCC) Stable, cont below -     sertraline (ZOLOFT) 50 MG tablet; Take 1 tablet (50 mg total) by mouth daily.  Restless leg Stable, cont below -     pramipexole (MIRAPEX) 0.25 MG tablet; Take 2 tablets (0.5 mg total) by mouth at bedtime.   Follow up plan: Return in about 3 months (around 08/07/2018). Rex Krasarol Shauntee Karp, MD [facility]

## 2018-05-09 ENCOUNTER — Other Ambulatory Visit: Payer: Self-pay | Admitting: Pediatrics

## 2018-05-09 ENCOUNTER — Encounter: Payer: Self-pay | Admitting: Pediatrics

## 2018-05-09 ENCOUNTER — Telehealth: Payer: Self-pay | Admitting: Pediatrics

## 2018-05-09 LAB — BASIC METABOLIC PANEL
BUN/Creatinine Ratio: 17 (ref 12–28)
BUN: 14 mg/dL (ref 8–27)
CO2: 24 mmol/L (ref 20–29)
Calcium: 9.7 mg/dL (ref 8.7–10.3)
Chloride: 101 mmol/L (ref 96–106)
Creatinine, Ser: 0.83 mg/dL (ref 0.57–1.00)
GFR calc Af Amer: 84 mL/min/{1.73_m2} (ref >59)
GFR calc non Af Amer: 73 mL/min/{1.73_m2} (ref >59)
Glucose: 83 mg/dL (ref 65–99)
Potassium: 4.6 mmol/L (ref 3.5–5.2)
Sodium: 142 mmol/L (ref 134–144)

## 2018-05-09 NOTE — Progress Notes (Signed)
Ok to stop metformin, phone message in

## 2018-05-09 NOTE — Telephone Encounter (Signed)
Pt asking if she should continue Metformin? Please advise

## 2018-05-09 NOTE — Telephone Encounter (Signed)
Let stop the metformin.  I taken off her list.  We will recheck her numbers next visit.  If average blood sugar goes up a lot, we might need to restart it.

## 2018-05-15 ENCOUNTER — Telehealth: Payer: Self-pay | Admitting: Pediatrics

## 2018-05-15 NOTE — Telephone Encounter (Signed)
179, 178, 184 - non fasting.    Aware to check several fasting readings and to call them back in later this week.

## 2018-05-19 ENCOUNTER — Telehealth: Payer: Self-pay | Admitting: Pediatrics

## 2018-05-19 NOTE — Telephone Encounter (Signed)
Continue to monitor blood sugars over the next week and call back with report. If the blood sugars remain this elevated, we may need to restart the metformin. Check the blood sugars in the morning and then do a few checks 2 hours after a meal.

## 2018-05-19 NOTE — Telephone Encounter (Signed)
Pt was aware of going off Metformin, see call from 05/15/18. Will close encounter.

## 2018-05-19 NOTE — Telephone Encounter (Signed)
Pt aware of provider feedback and voiced understanding. 

## 2018-05-22 IMAGING — MR MR LUMBAR SPINE W/O CM
4 of 5 series · 15 of 48 positions shown · non-contrast
Comparison: Radiography 03/04/2017

CLINICAL DATA: Acute right-sided back pain.

EXAM:
MRI LUMBAR SPINE WITHOUT CONTRAST
TECHNIQUE: Multiplanar, multisequence MR imaging of the lumbar spine was
performed. No intravenous contrast was administered.

[Series 3: T2 · sagittal · 4.0mm · 0.78mm/px · 6 of 15 slices shown (1 of 2)]
[im 1/15]
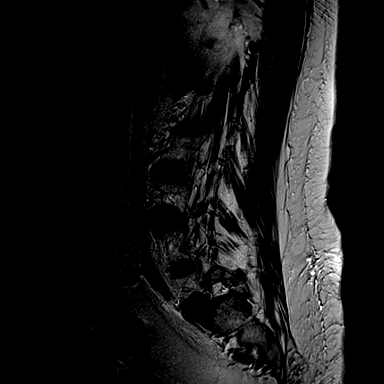
[im 3/15]
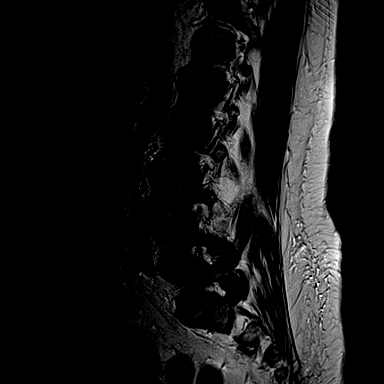
[im 6/15]
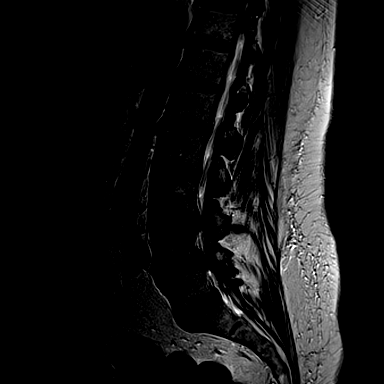
[im 9/15]
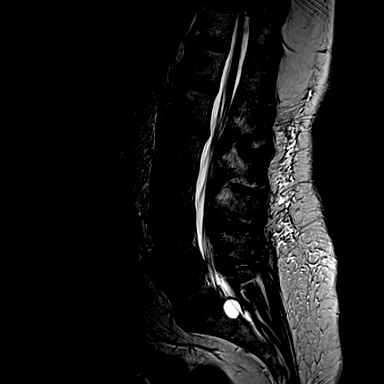
[im 12/15]
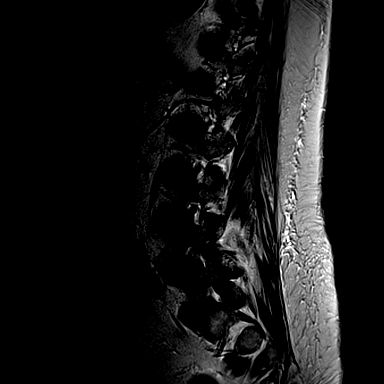
[im 15/15]
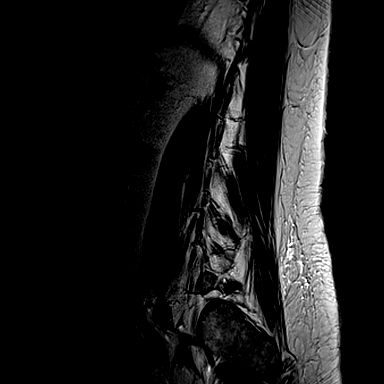

[Series 4: T1 · sagittal · 4.0mm · 0.39mm/px · 3 of 15 slices shown (1 of 2)]
[im 3/15]
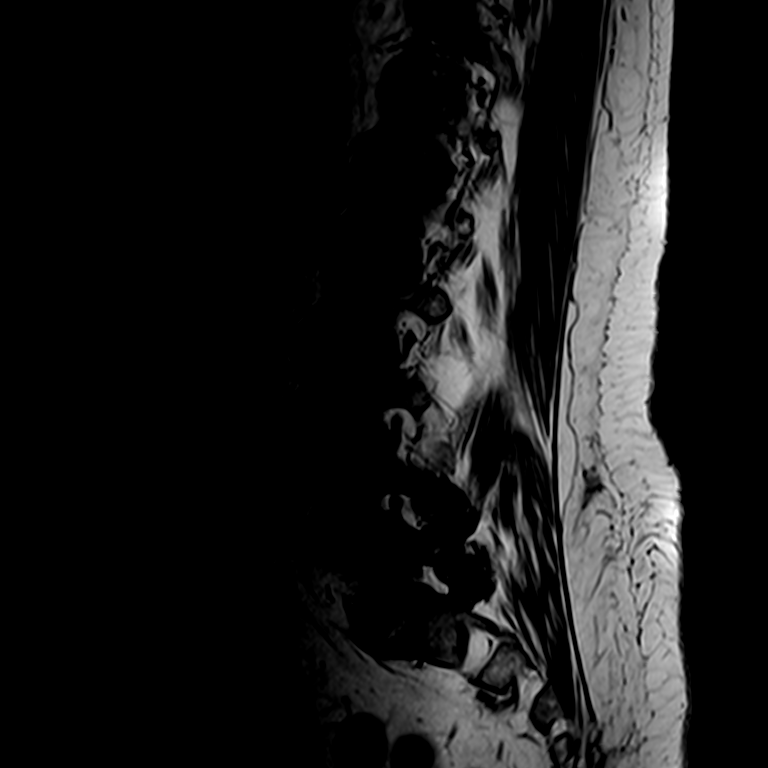
[im 9/15]
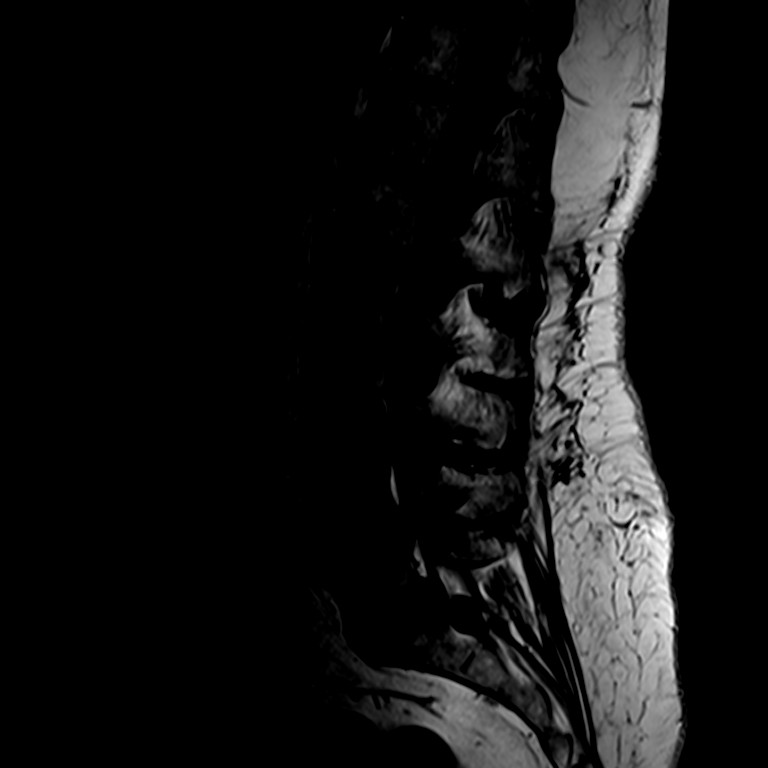
[im 15/15]
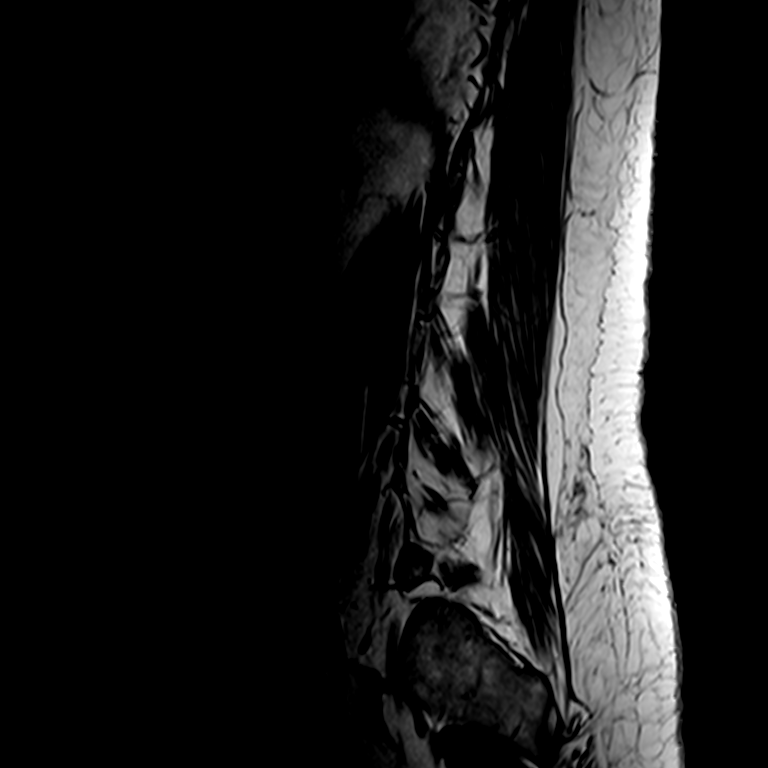

[Series 6: T2 · axial · 4.0mm · 0.24mm/px · z∈[-159,-9]mm · 3 of 40 slices shown (2 of 2)]
[im 6/40]
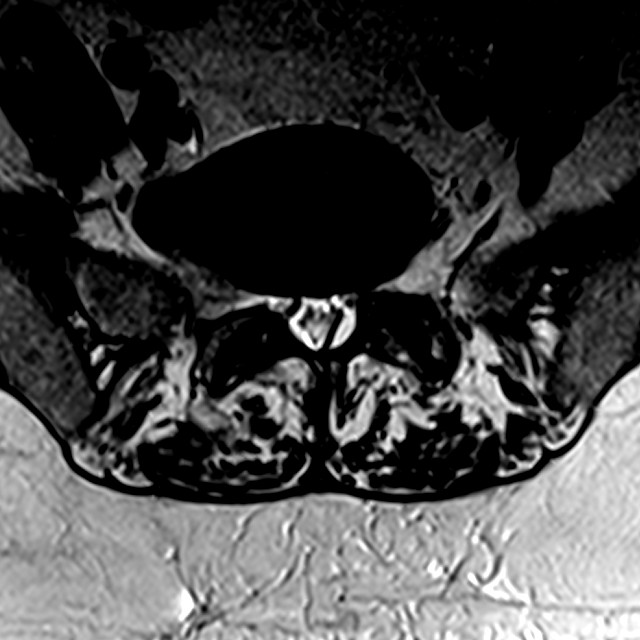
[im 20/40]
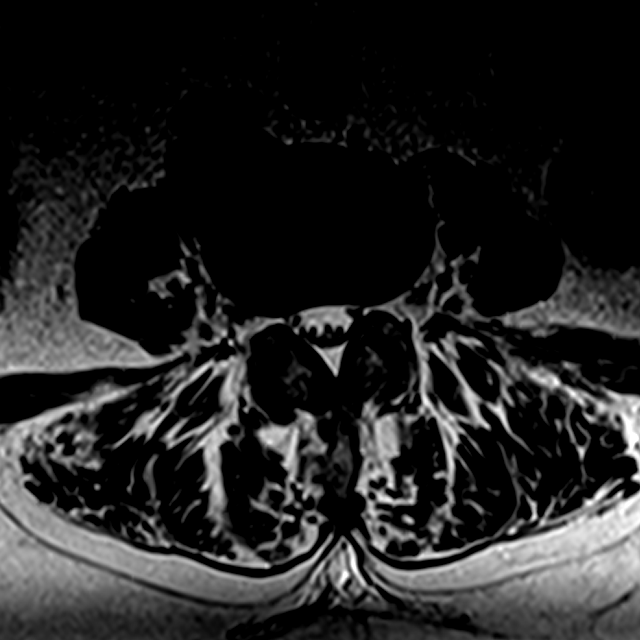
[im 34/40]
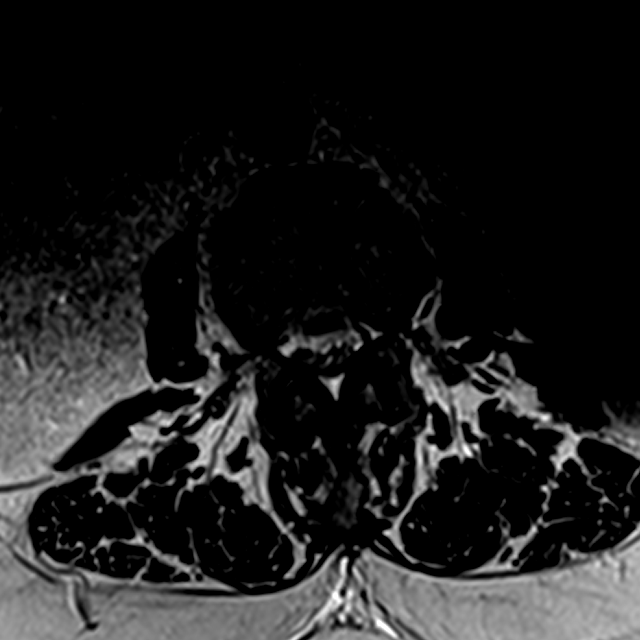

[Series 7: T1 · axial · 4.0mm · 0.25mm/px · z∈[-161,-9]mm · 3 of 40 slices shown (2 of 2)]
[im 6/40]
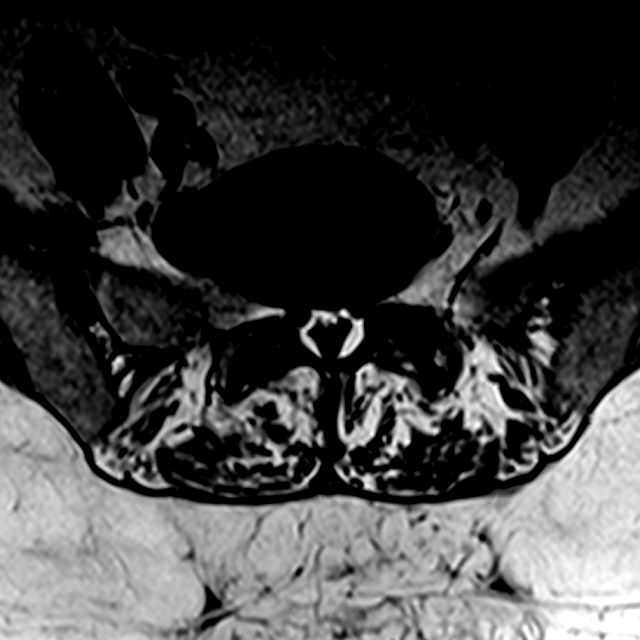
[im 20/40]
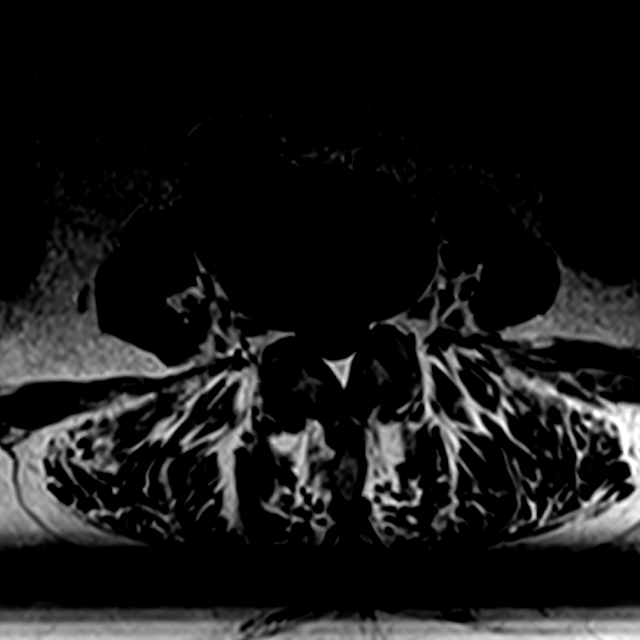
[im 34/40]
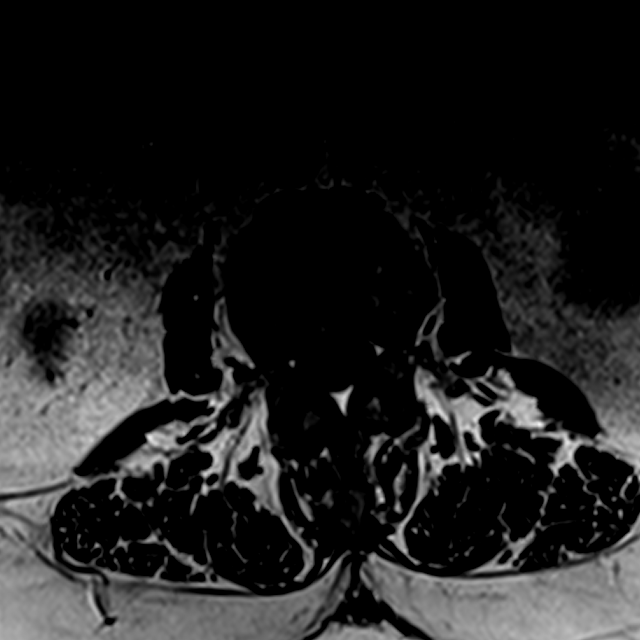

[15 of 48 positions shown; findings below may reference images not displayed]

FINDINGS: Segmentation:  Standard.

Alignment:  Slight anterolisthesis at L4-5, facet mediated.

Vertebrae: Marrow edema about right-sided L3-4 endplate osteophyte.
No fracture, discitis, or aggressive bone lesion.

Conus medullaris: Extends to the L1 level and appears normal. Small
sacral Tarlov cysts at S1.

Paraspinal and other soft tissues: Negative

Disc levels:

T12- L1: Mild spondylosis.  No impingement

L1-L2: Mild disc narrowing and desiccation. Superiorly migrating
disc extrusion without compressive stenosis.

L2-L3: Spondylosis with rightward spur potentially causing
ankylosis. Negative facets. No impingement

L3-L4: Facet arthropathy with hypertrophy. Marrow edema about
right-sided endplate spur. No impingement

L4-L5: Advanced facet arthropathy with slight anterolisthesis. The
disc is bulging. Moderate spinal stenosis. Bilateral L5 impingement
in the subarticular recesses. Patent foramina.

L5-S1:Bulky rightward far-lateral spurring. Mild degenerative facet
spurring. No impingement.
IMPRESSION: 1. L4-5 moderate spinal stenosis from disc bulging and degenerative
posterior element hypertrophy. Bilateral L5 impingement in the
subarticular recesses.
2. L1-2 superiorly migrating disc extrusion without compressive
stenosis.
3. Right-sided endplate osteophytes with associated mild marrow
edema at L3-4.

## 2018-05-24 ENCOUNTER — Other Ambulatory Visit: Payer: Self-pay | Admitting: *Deleted

## 2018-05-24 ENCOUNTER — Telehealth: Payer: Self-pay | Admitting: Family Medicine

## 2018-05-24 MED ORDER — BD SWAB SINGLE USE REGULAR PADS: MEDICATED_PAD | 3 refills | Status: DC

## 2018-05-24 MED ORDER — ACCU-CHEK AVIVA PLUS W/DEVICE KIT: PACK | 0 refills | Status: DC

## 2018-05-25 NOTE — Telephone Encounter (Signed)
Patient states she has a new meter on the way from pharmacy.

## 2018-05-31 ENCOUNTER — Other Ambulatory Visit: Payer: Self-pay | Admitting: *Deleted

## 2018-05-31 MED ORDER — GLUCOSE BLOOD VI STRP: ORAL_STRIP | 3 refills | Status: DC

## 2018-06-07 ENCOUNTER — Other Ambulatory Visit: Payer: Self-pay | Admitting: Pediatrics

## 2018-06-07 ENCOUNTER — Other Ambulatory Visit: Payer: Self-pay | Admitting: *Deleted

## 2018-06-07 DIAGNOSIS — G2581 Restless legs syndrome: Secondary | ICD-10-CM

## 2018-06-07 MED ORDER — GLUCOSE BLOOD VI STRP: ORAL_STRIP | 3 refills | Status: DC

## 2018-07-05 ENCOUNTER — Other Ambulatory Visit: Payer: Self-pay | Admitting: Pediatrics

## 2018-07-05 DIAGNOSIS — G629 Polyneuropathy, unspecified: Secondary | ICD-10-CM

## 2018-07-05 NOTE — Telephone Encounter (Signed)
OV 08/07/18

## 2018-07-20 ENCOUNTER — Ambulatory Visit (INDEPENDENT_AMBULATORY_CARE_PROVIDER_SITE_OTHER): Payer: Medicare Other | Admitting: Family Medicine

## 2018-07-20 ENCOUNTER — Other Ambulatory Visit: Payer: Self-pay

## 2018-07-20 ENCOUNTER — Encounter: Payer: Self-pay | Admitting: Family Medicine

## 2018-07-20 DIAGNOSIS — J301 Allergic rhinitis due to pollen: Secondary | ICD-10-CM

## 2018-07-20 MED ORDER — FLUTICASONE PROPIONATE 50 MCG/ACT NA SUSP: 2.0000 | Freq: Every day | NASAL | 6 refills | Status: DC

## 2018-07-20 MED ORDER — CETIRIZINE HCL 10 MG PO TABS: 10.0000 mg | ORAL_TABLET | Freq: Every day | ORAL | 11 refills | Status: DC

## 2018-07-20 NOTE — Progress Notes (Signed)
Virtual Visit via telephone Note  I connected with Allison Rollins on 07/20/18 at 1050 by telephone and verified that I am speaking with the correct person using two identifiers. Allison Rollins is currently located at home and family is currently with them during visit. The provider, Monia Pouch, FNP is located in their office at time of visit.  I discussed the limitations, risks, security and privacy concerns of performing an evaluation and management service by telephone and the availability of in person appointments. I also discussed with the patient that there may be a patient responsible charge related to this service. The patient expressed understanding and agreed to proceed.  Subjective:  Patient ID: Allison Rollins, female    DOB: October 25, 1950, 68 y.o.   MRN: [id number]  Chief Complaint:  Nasal congestion, rhinorrhea  HPI: Allison Rollins is a 68 y.o. female presenting on 07/20/2018 for nasal congestion, rhinorrhea  Pt reports nasal congestion, rhinorrhea, and scratchy throat. She reports this started yesterday after sitting on her porch. States she has not been taking her antihistamines as prescribed. She denies fever, cough, shortness of breath, chills, or chest pain. No weakness or fatigue. Pt states she has not traveled or been around anyone sick.    Relevant past medical, surgical, family, and social history reviewed and updated as indicated.  Allergies and medications reviewed and updated.   Past Medical History:  Diagnosis Date  . Anxiety   . Asthma   . CHF (congestive heart failure) ([place])   . COPD (chronic obstructive pulmonary disease) ([place])   . Depression   . Diabetes mellitus without complication ([place])   . HTN (hypertension)   . Hypoglycemia   . Kidney stones   . Parkinson's disease   . Pneumonia   . S/P colonoscopy 10/15/04   normal    Past Surgical History:  Procedure Laterality Date  . CHOLECYSTECTOMY    . COLONOSCOPY  10/15/2004   Normal rectum/Normal colon   . COLONOSCOPY N/A 05/10/2016   Procedure: COLONOSCOPY;  Surgeon: Danie Binder, MD;  [facility];  Service: Endoscopy;  Laterality: N/A;  10:30  . ESOPHAGOGASTRODUODENOSCOPY  09/11/2010   Dysphagia likely multifactorial (possible Candida esophagitis, likely nonspecific esophageal motility disorder, and/or uncontrolled gastroesophageal reflux disease), status post empiric dilation  . KIDNEY STONE SURGERY      Social History   Socioeconomic History  . Marital status: Widowed    Spouse name: Not on file  . Number of children: 2  . Years of education: Not on file  . Highest education level: Not on file  Occupational History  . Occupation: disabled  Social Needs  . Financial resource strain: Not on file  . Food insecurity:    Worry: Not on file    Inability: Not on file  . Transportation needs:    Medical: Not on file    Non-medical: Not on file  Tobacco Use  . Smoking status: Former Smoker    Packs/day: 0.25    Years: 30.00    Pack years: 7.50    Types: Cigarettes    Start date: 01/04/1965    Last attempt to quit: 01/03/1995    Years since quitting: 23.5  . Smokeless tobacco: Never Used  Substance and Sexual Activity  . Alcohol use: No    Alcohol/week: 0.0 standard drinks  . Drug use: No  . Sexual activity: Not on file  Lifestyle  . Physical activity:    Days per week: Not on file  Minutes per session: Not on file  . Stress: Not on file  Relationships  . Social connections:    Talks on phone: Not on file    Gets together: Not on file    Attends religious service: Not on file    Active member of club or organization: Not on file    Attends meetings of clubs or organizations: Not on file    Relationship status: Not on file  . Intimate partner violence:    Fear of current or ex partner: Not on file    Emotionally abused: Not on file    Physically abused: Not on file    Forced sexual activity: Not on file  Other Topics Concern  . Not on file  Social  History Narrative  . Not on file    Outpatient Encounter Medications as of 07/20/2018  Medication Sig  . acetaminophen (TYLENOL) 500 MG tablet Take 500 mg by mouth every 6 (six) hours as needed for moderate pain.   Marland Kitchen albuterol (PROVENTIL HFA;VENTOLIN HFA) 108 (90 Base) MCG/ACT inhaler Inhale 2 puffs into the lungs every 6 (six) hours as needed for wheezing.  Marland Kitchen albuterol (PROVENTIL) (2.5 MG/3ML) 0.083% nebulizer solution Take 3 mLs (2.5 mg total) by nebulization every 6 (six) hours as needed for wheezing or shortness of breath.  . Alcohol Swabs (B-D SINGLE USE SWABS REGULAR) PADS Test sugars daily  . aspirin EC 81 MG tablet Take 1 tablet (81 mg total) by mouth daily.  . blood glucose meter kit and supplies Accu-check Nano smartview meter  . Blood Glucose Monitoring Suppl (ACCU-CHEK AVIVA PLUS) w/Device KIT Test sugars daily  . cetirizine (ZYRTEC) 10 MG tablet Take 1 tablet (10 mg total) by mouth daily.  . cholecalciferol (VITAMIN D) 1000 units tablet Take 1 tablet (1,000 Units total) by mouth daily.  . fluticasone (FLONASE) 50 MCG/ACT nasal spray Place 2 sprays into both nostrils daily.  . furosemide (LASIX) 40 MG tablet TAKE 1 TABLET TWICE DAILY AS NEEDED FOR  SWELLING, FLUID  . gabapentin (NEURONTIN) 300 MG capsule Take 1 to 2 caps twice daily OR 1 cap every morning & 2 caps every evening  . glucose blood (ACCU-CHEK AVIVA PLUS) test strip Test sugars daily  . lisinopril (PRINIVIL,ZESTRIL) 10 MG tablet Take 1 tablet (10 mg total) by mouth daily.  . meloxicam (MOBIC) 7.5 MG tablet TAKE 1 TABLET (7.5 MG TOTAL) BY MOUTH DAILY AS NEEDED FOR PAIN.  . nitroGLYCERIN (NITROSTAT) 0.4 MG SL tablet Place 1 tablet (0.4 mg total) under the tongue every 5 (five) minutes as needed for chest pain.  . Omega-3 Fatty Acids (FISH OIL PO) Take 1 capsule by mouth daily.   . pantoprazole (PROTONIX) 40 MG tablet Take 1 tablet (40 mg total) by mouth daily.  . pramipexole (MIRAPEX) 0.25 MG tablet TAKE 2 TABLETS AT  BEDTIME  . pravastatin (PRAVACHOL) 80 MG tablet Take 1 tablet (80 mg total) by mouth daily.  . sertraline (ZOLOFT) 50 MG tablet Take 1 tablet (50 mg total) by mouth daily.  Marland Kitchen triamcinolone (KENALOG) 0.025 % ointment Apply 1 application topically 2 (two) times daily.  . [DISCONTINUED] cetirizine (ZYRTEC) 10 MG tablet Take 1 tablet (10 mg total) by mouth daily.  . [DISCONTINUED] fluticasone (FLONASE) 50 MCG/ACT nasal spray Place 2 sprays into both nostrils daily.   No facility-administered encounter medications on file as of 07/20/2018.     Allergies  Allergen Reactions  . Penicillins Itching and Rash    Has patient had a  PCN reaction causing immediate rash, facial/tongue/throat swelling, SOB or lightheadedness with hypotension: no Has patient had a PCN reaction causing severe rash involving mucus membranes or skin necrosis: no Has patient had a PCN reaction that required hospitalization: no Has patient had a PCN reaction occurring within the last 10 years: no If all of the above answers are "NO", then may proceed with Cephalosporin use.   . Sulfa Antibiotics Rash    Review of Systems  Constitutional: Negative for chills, fatigue and fever.  HENT: Positive for congestion, postnasal drip, rhinorrhea and sneezing. Negative for dental problem, drooling, ear discharge, ear pain, facial swelling, hearing loss, mouth sores, nosebleeds, sinus pressure, sinus pain, sore throat, tinnitus, trouble swallowing and voice change.   Eyes: Positive for itching. Negative for photophobia, pain, discharge, redness and visual disturbance.  Respiratory: Negative for cough, chest tightness and shortness of breath.   Skin: Negative for color change and rash.  Neurological: Negative for dizziness, weakness, light-headedness and headaches.  Psychiatric/Behavioral: Negative for confusion.  All other systems reviewed and are negative.        Observations/Objective: Pt alert and oriented, answers all questions  appropriately, and able to speak in full sentences.    Assessment and Plan: Lorelai was seen today for nasal congestion.  Diagnoses and all orders for this visit:  Seasonal allergic rhinitis due to pollen Restart Zyrtec and Flonase. Avoid triggers. Report any new or worsening symptoms.  -     fluticasone (FLONASE) 50 MCG/ACT nasal spray; Place 2 sprays into both nostrils daily. -     cetirizine (ZYRTEC) 10 MG tablet; Take 1 tablet (10 mg total) by mouth daily.     Follow Up Instructions: Report any new or worsening symptoms.     I discussed the assessment and treatment plan with the patient. The patient was provided an opportunity to ask questions and all were answered. The patient agreed with the plan and demonstrated an understanding of the instructions.   The patient was advised to call back or seek an in-person evaluation if the symptoms worsen or if the condition fails to improve as anticipated.  The above assessment and management plan was discussed with the patient. The patient verbalized understanding of and has agreed to the management plan. Patient is aware to call the clinic if symptoms persist or worsen. Patient is aware when to return to the clinic for a follow-up visit. Patient educated on when it is appropriate to go to the emergency department.    I provided 12 minutes of non-face-to-face time during this encounter. The call started at 1050. The call ended at 1102.   Monia Pouch, FNP-C [facility] [address] [place], [place] [address] [phone]

## 2018-08-04 ENCOUNTER — Other Ambulatory Visit: Payer: Self-pay | Admitting: Pediatrics

## 2018-08-08 ENCOUNTER — Ambulatory Visit (INDEPENDENT_AMBULATORY_CARE_PROVIDER_SITE_OTHER): Payer: Medicare Other | Admitting: Family Medicine

## 2018-08-08 ENCOUNTER — Encounter: Payer: Self-pay | Admitting: Family Medicine

## 2018-08-08 ENCOUNTER — Other Ambulatory Visit: Payer: Self-pay

## 2018-08-08 DIAGNOSIS — E1142 Type 2 diabetes mellitus with diabetic polyneuropathy: Secondary | ICD-10-CM

## 2018-08-08 NOTE — Progress Notes (Signed)
Pt requested to reschedule due to phone being out of service.

## 2018-08-09 ENCOUNTER — Other Ambulatory Visit: Payer: Self-pay | Admitting: Pediatrics

## 2018-08-09 DIAGNOSIS — G2581 Restless legs syndrome: Secondary | ICD-10-CM

## 2018-08-10 ENCOUNTER — Other Ambulatory Visit: Payer: Self-pay

## 2018-08-10 ENCOUNTER — Ambulatory Visit (INDEPENDENT_AMBULATORY_CARE_PROVIDER_SITE_OTHER): Payer: Medicare Other | Admitting: Family Medicine

## 2018-08-10 ENCOUNTER — Encounter: Payer: Self-pay | Admitting: Family Medicine

## 2018-08-10 DIAGNOSIS — I10 Essential (primary) hypertension: Secondary | ICD-10-CM

## 2018-08-10 DIAGNOSIS — I5032 Chronic diastolic (congestive) heart failure: Secondary | ICD-10-CM

## 2018-08-10 DIAGNOSIS — E1142 Type 2 diabetes mellitus with diabetic polyneuropathy: Secondary | ICD-10-CM | POA: Diagnosis not present

## 2018-08-10 DIAGNOSIS — E785 Hyperlipidemia, unspecified: Secondary | ICD-10-CM

## 2018-08-10 DIAGNOSIS — I152 Hypertension secondary to endocrine disorders: Secondary | ICD-10-CM

## 2018-08-10 DIAGNOSIS — E1169 Type 2 diabetes mellitus with other specified complication: Secondary | ICD-10-CM | POA: Diagnosis not present

## 2018-08-10 DIAGNOSIS — K219 Gastro-esophageal reflux disease without esophagitis: Secondary | ICD-10-CM | POA: Diagnosis not present

## 2018-08-10 DIAGNOSIS — E1159 Type 2 diabetes mellitus with other circulatory complications: Secondary | ICD-10-CM

## 2018-08-10 NOTE — Progress Notes (Signed)
Virtual Visit via telephone Note Due to COVID-19, visit is conducted virtually and was requested by patient.  I connected with Allison Rollins on 08/10/18 at 1305 by telephone and verified that I am speaking with the correct person using two identifiers. Allison Rollins is currently located at home and no one is currently with them during visit. The provider, Monia Pouch, FNP is located in their office at time of visit.  I discussed the limitations, risks, security and privacy concerns of performing an evaluation and management service by telephone and the availability of in person appointments. I also discussed with the patient that there may be a patient responsible charge related to this service. The patient expressed understanding and agreed to proceed.  Subjective:  Patient ID: Allison Rollins, female    DOB: February 04, 1951, 68 y.o.   MRN: [id number]  Chief Complaint:  Medical Management of Chronic Issues   HPI: Allison Rollins is a 68 y.o. female presenting on 08/10/2018 for Medical Management of Chronic Issues   1. Type 2 diabetes mellitus with diabetic polyneuropathy, without long-term current use of insulin (HCC) Diabetes mellitus 2 Diet controlled at this time. Last A1C 04/2018 was 5.7 so metformin was stopped by Dr. Evette Doffing at this time.  Checking CBGs? Yes  Fasting avg - 106  Postprandial average - 120 Exercising regularly? - Is active daily, no set exercise plan Watching carbohydrate intake? - Yes Neuropathy ? - yes, but well controlled with gabapentin Hypoglycemic events - No Pertinent ROS:  Polyuria - No Polydipsia - No Vision problems - No   2. Hyperlipidemia associated with type 2 diabetes mellitus ([place]) Compliant with medications without associated side effects. Is active daily. Does try to watch diet. Has lost a few pounds over the last few weeks.   3. Gastroesophageal reflux disease without esophagitis Well controlled with medications. Denies sore throat, cough,  hemoptysis, voice changes, or dysphagia.   4. Hypertension associated with diabetes ([place]) Complaint with meds - Yes Checking BP at home ranging 130/70 Exercising Regularly - Is active daily, no set exercise routine Watching Salt intake - Yes Pertinent ROS:  Headache - No Chest pain - No Dyspnea - No Palpitations - No LE edema - No They report good compliance with medications and can restate their regimen by memory. No medication side effects.  BP Readings from Last 3 Encounters:  05/08/18 130/85  02/20/18 139/85  02/03/18 134/83     5. Chronic diastolic heart failure ([place]) Doing well. No lower extremity edema, orthopnea, chest pain, fatigue, or exertional shortness of breath. No palpitations or dizziness. No weakness or confusion.     Relevant past medical, surgical, family, and social history reviewed and updated as indicated.  Allergies and medications reviewed and updated.   Past Medical History:  Diagnosis Date  . Anxiety   . Asthma   . Chest pain 12/22/2015  . CHF (congestive heart failure) ([place])   . COPD (chronic obstructive pulmonary disease) ([place])   . Depression   . Diabetes mellitus without complication ([place])   . HTN (hypertension)   . Hypoglycemia   . Hypokalemia 07/28/2013  . Kidney stones   . Parkinson's disease   . Pneumonia   . S/P colonoscopy 10/15/04   normal    Past Surgical History:  Procedure Laterality Date  . CHOLECYSTECTOMY    . COLONOSCOPY  10/15/2004   Normal rectum/Normal colon  . COLONOSCOPY N/A 05/10/2016   Procedure: COLONOSCOPY;  Surgeon: Danie Binder, MD;  [facility];  Service: Endoscopy;  Laterality: N/A;  10:30  . ESOPHAGOGASTRODUODENOSCOPY  09/11/2010   Dysphagia likely multifactorial (possible Candida esophagitis, likely nonspecific esophageal motility disorder, and/or uncontrolled gastroesophageal reflux disease), status post empiric dilation  . KIDNEY STONE SURGERY      Social History   Socioeconomic History   . Marital status: Widowed    Spouse name: Not on file  . Number of children: 2  . Years of education: Not on file  . Highest education level: Not on file  Occupational History  . Occupation: disabled  Social Needs  . Financial resource strain: Not on file  . Food insecurity:    Worry: Not on file    Inability: Not on file  . Transportation needs:    Medical: Not on file    Non-medical: Not on file  Tobacco Use  . Smoking status: Former Smoker    Packs/day: 0.25    Years: 30.00    Pack years: 7.50    Types: Cigarettes    Start date: 01/04/1965    Last attempt to quit: 01/03/1995    Years since quitting: 23.6  . Smokeless tobacco: Never Used  Substance and Sexual Activity  . Alcohol use: No    Alcohol/week: 0.0 standard drinks  . Drug use: No  . Sexual activity: Not on file  Lifestyle  . Physical activity:    Days per week: Not on file    Minutes per session: Not on file  . Stress: Not on file  Relationships  . Social connections:    Talks on phone: Not on file    Gets together: Not on file    Attends religious service: Not on file    Active member of club or organization: Not on file    Attends meetings of clubs or organizations: Not on file    Relationship status: Not on file  . Intimate partner violence:    Fear of current or ex partner: Not on file    Emotionally abused: Not on file    Physically abused: Not on file    Forced sexual activity: Not on file  Other Topics Concern  . Not on file  Social History Narrative  . Not on file    Outpatient Encounter Medications as of 08/10/2018  Medication Sig  . acetaminophen (TYLENOL) 500 MG tablet Take 500 mg by mouth every 6 (six) hours as needed for moderate pain.   Marland Kitchen albuterol (PROVENTIL HFA;VENTOLIN HFA) 108 (90 Base) MCG/ACT inhaler Inhale 2 puffs into the lungs every 6 (six) hours as needed for wheezing.  Marland Kitchen albuterol (PROVENTIL) (2.5 MG/3ML) 0.083% nebulizer solution Take 3 mLs (2.5 mg total) by nebulization  every 6 (six) hours as needed for wheezing or shortness of breath.  . Alcohol Swabs (B-D SINGLE USE SWABS REGULAR) PADS Test sugars daily  . aspirin EC 81 MG tablet Take 1 tablet (81 mg total) by mouth daily.  . blood glucose meter kit and supplies Accu-check Nano smartview meter  . Blood Glucose Monitoring Suppl (ACCU-CHEK AVIVA PLUS) w/Device KIT Test sugars daily  . cetirizine (ZYRTEC) 10 MG tablet Take 1 tablet (10 mg total) by mouth daily.  . cholecalciferol (VITAMIN D) 1000 units tablet Take 1 tablet (1,000 Units total) by mouth daily.  . fluticasone (FLONASE) 50 MCG/ACT nasal spray Place 2 sprays into both nostrils daily.  . furosemide (LASIX) 20 MG tablet Take 1 tablet by mouth every day as needed  . furosemide (LASIX) 40 MG tablet TAKE  1 TABLET TWICE DAILY AS NEEDED FOR  SWELLING, FLUID  . gabapentin (NEURONTIN) 300 MG capsule Take 1 to 2 caps twice daily OR 1 cap every morning & 2 caps every evening  . glucose blood (ACCU-CHEK AVIVA PLUS) test strip Test sugars daily  . lisinopril (PRINIVIL,ZESTRIL) 10 MG tablet Take 1 tablet (10 mg total) by mouth daily.  . meloxicam (MOBIC) 7.5 MG tablet TAKE 1 TABLET (7.5 MG TOTAL) BY MOUTH DAILY AS NEEDED FOR PAIN.  . nitroGLYCERIN (NITROSTAT) 0.4 MG SL tablet Place 1 tablet (0.4 mg total) under the tongue every 5 (five) minutes as needed for chest pain.  . Omega-3 Fatty Acids (FISH OIL PO) Take 1 capsule by mouth daily.   . pantoprazole (PROTONIX) 40 MG tablet Take 1 tablet (40 mg total) by mouth daily.  . pramipexole (MIRAPEX) 0.25 MG tablet Take 2 tablets by mouth at bedtime  . pravastatin (PRAVACHOL) 80 MG tablet Take 1 tablet (80 mg total) by mouth daily.  . sertraline (ZOLOFT) 100 MG tablet   . triamcinolone (KENALOG) 0.025 % ointment Apply 1 application topically 2 (two) times daily.  . [DISCONTINUED] sertraline (ZOLOFT) 50 MG tablet Take 1 tablet (50 mg total) by mouth daily.   No facility-administered encounter medications on file as of  08/10/2018.     Allergies  Allergen Reactions  . Penicillins Itching and Rash    Has patient had a PCN reaction causing immediate rash, facial/tongue/throat swelling, SOB or lightheadedness with hypotension: no Has patient had a PCN reaction causing severe rash involving mucus membranes or skin necrosis: no Has patient had a PCN reaction that required hospitalization: no Has patient had a PCN reaction occurring within the last 10 years: no If all of the above answers are "NO", then may proceed with Cephalosporin use.   . Sulfa Antibiotics Rash    Review of Systems  Constitutional: Negative for activity change, appetite change, chills, fatigue, fever and unexpected weight change.  HENT: Negative for sore throat, trouble swallowing and voice change.   Eyes: Negative for photophobia and visual disturbance.  Respiratory: Negative for cough, chest tightness, shortness of breath and wheezing.   Cardiovascular: Negative for chest pain, palpitations and leg swelling.  Gastrointestinal: Negative for abdominal distention, abdominal pain, blood in stool, constipation, diarrhea, nausea and vomiting.  Endocrine: Negative for polydipsia, polyphagia and polyuria.  Genitourinary: Negative for decreased urine volume and difficulty urinating.  Musculoskeletal: Negative for arthralgias and myalgias.  Skin: Negative for color change and pallor.  Neurological: Negative for dizziness, tremors, seizures, syncope, facial asymmetry, speech difficulty, weakness, light-headedness, numbness and headaches.  Psychiatric/Behavioral: Negative for confusion.  All other systems reviewed and are negative.        Observations/Objective: No vital signs or physical exam, this was a telephone or virtual health encounter.  Pt alert and oriented, answers all questions appropriately, and able to speak in full sentences.    Assessment and Plan: Chaniah was seen today for medical management of chronic issues.  Diagnoses  and all orders for this visit:  Type 2 diabetes mellitus with diabetic polyneuropathy, without long-term current use of insulin ([place]) Will recheck A1C in 3 months. Doing well controlling with diet alone. Diet and exercise encouraged. Report any persistent high blood sugar readings.   Hyperlipidemia associated with type 2 diabetes mellitus ([place]) Diet and exercise encouraged. Continue medications as prescribed.  Gastroesophageal reflux disease without esophagitis Well controlled. Continue medications as prescribed.   Hypertension associated with diabetes ([place]) Well controlled. Continue medications as  prescribed. Diet and exercise encouraged.   Chronic diastolic heart failure (HCC) Stable, continue medications as prescribed. Report any new or worsening symptoms. Report daily weight gain over 2 pounds or weekly weight gain over 5 pounds.     Follow Up Instructions: Return in about 3 months (around 11/09/2018), or if symptoms worsen or fail to improve.    I discussed the assessment and treatment plan with the patient. The patient was provided an opportunity to ask questions and all were answered. The patient agreed with the plan and demonstrated an understanding of the instructions.   The patient was advised to call back or seek an in-person evaluation if the symptoms worsen or if the condition fails to improve as anticipated.  The above assessment and management plan was discussed with the patient. The patient verbalized understanding of and has agreed to the management plan. Patient is aware to call the clinic if symptoms persist or worsen. Patient is aware when to return to the clinic for a follow-up visit. Patient educated on when it is appropriate to go to the emergency department.    I provided 25 minutes of non-face-to-face time during this encounter. The call started at 1305. The call ended at 1330.   Monia Pouch, FNP-C [facility] [address] [place], [place] [address] [phone]

## 2018-08-18 ENCOUNTER — Telehealth: Payer: Self-pay | Admitting: Gastroenterology

## 2018-08-23 ENCOUNTER — Ambulatory Visit (INDEPENDENT_AMBULATORY_CARE_PROVIDER_SITE_OTHER): Payer: Medicare Other | Admitting: Gastroenterology

## 2018-08-23 ENCOUNTER — Other Ambulatory Visit: Payer: Self-pay

## 2018-08-23 ENCOUNTER — Encounter: Payer: Self-pay | Admitting: Gastroenterology

## 2018-08-23 DIAGNOSIS — R1310 Dysphagia, unspecified: Secondary | ICD-10-CM

## 2018-08-23 DIAGNOSIS — R1319 Other dysphagia: Secondary | ICD-10-CM

## 2018-08-23 DIAGNOSIS — K219 Gastro-esophageal reflux disease without esophagitis: Secondary | ICD-10-CM | POA: Diagnosis not present

## 2018-08-23 NOTE — Progress Notes (Signed)
Subjective:    Patient ID: Allison Rollins, female    DOB: [birth year], 68 y.o.   MRN: [id number]   Primary Care Physician:  Baruch Gouty, FNP  Primary GI:  Barney Drain, MD   Patient Location: home   Provider Location: [facility] office   Reason for Visit: DYSPHAGIA   Persons present on the virtual encounter, with roles: patient, myself (provider), MARTINA BOOTH CMA (update meds/allergies)   Total time (minutes) spent on medical discussion:  17 MINUTES   Due to COVID-19, visit was VIA TELEPHONE VISIT DUE TO COVID 19. VISIT IS CONDUCTED VIRTUALLY AND WAS REQUESTED BY PATIENT.   Virtual Visit via TELEPHONE   I connected with Allison Rollins and verified that I am speaking with the correct person using two identifiers.   I discussed the limitations, risks, security and privacy concerns of performing an evaluation and management service by telephone/video and the availability of in person appointments. I also discussed with the patient that there may be a patient responsible charge related to this service. The patient expressed understanding and agreed to proceed.   HPI TROUBLE SWALLOWING FOR PAST TWO YEARS. CAN DRINK AFTER AND BEFORE IT GETS DOWN IT WILL COME OUT. TROUBLE USUALLY HAPPENS AT [facility] AND AT NIGHT. IF EATS SOMETHING SPICY IT CAN BRING IT ON: HAMBURGER W/ LETTUCE/TOMto, pork chop, or hot dog(everything on it). Can tolerate TACOS, & BBQ CHICKEN, WEIGHT: 238 LBS. BMs: QOD(TWICE). MAY HAVE PAIN WITH SWALLOWING TROUBLE WHEN HAS LOTS OF PHLEGM AND GOT AN ALLERGY PILL. HEARTBURN: QOD MAY HAVE BURNING IN HER THROAT. STILL CRIES ABOUT THE HUSBAND DYING 30 MINS AFTER HE APOLOGIZED AND THEY KISSED. ALSO DOG DIED AND FOUND HER BROTHER DEAD AS WELL. SON CHECKS ON HER ALL THE TIME.  PT DENIES FEVER, CHILLS, HEMATOCHEZIA, HEMATEMESIS, nausea, vomiting, melena, diarrhea, CHEST PAIN, SHORTNESS OF BREATH,  CHANGE IN BOWEL IN HABITS, OR constipation.  Past Medical History:  Diagnosis Date  . Anxiety    . Asthma   . Chest pain 12/22/2015  . CHF (congestive heart failure) ([place])   . COPD (chronic obstructive pulmonary disease) ([place])   . Depression   . Diabetes mellitus without complication ([place])   . HTN (hypertension)   . Hypoglycemia   . Hypokalemia 07/28/2013  . Kidney stones   . Parkinson's disease   . Pneumonia   . S/P colonoscopy 10/15/04   normal   Past Surgical History:  Procedure Laterality Date  . CHOLECYSTECTOMY    . COLONOSCOPY  10/15/2004   Normal rectum/Normal colon  . COLONOSCOPY N/A 05/10/2016   Procedure: COLONOSCOPY;  Surgeon: Danie Binder, MD;  Location: [facility];  Service: Endoscopy;  Laterality: N/A;  10:30  . ESOPHAGOGASTRODUODENOSCOPY  09/11/2010   Dysphagia likely multifactorial (possible Candida esophagitis, likely nonspecific esophageal motility disorder, and/or uncontrolled gastroesophageal reflux disease), status post empiric dilation  . KIDNEY STONE SURGERY     Allergies  Allergen Reactions  . Penicillins Itching and Rash      . Sulfa Antibiotics Rash   Current Outpatient Medications  Medication Sig    . acetaminophen (TYLENOL) 500 MG tablet Take 500 mg by mouth every 6 (six) hours as needed for moderate pain.     Marland Kitchen albuterol (PROVENTIL HFA;VENTOLIN HFA) 108 (90 Base) MCG/ACT inhaler Inhale 2 puffs into the lungs every 6 (six) hours as needed for wheezing.    Marland Kitchen albuterol (PROVENTIL) (2.5 MG/3ML) 0.083% nebulizer solution Take 3 mLs (2.5 mg total) by nebulization every 6 (six) hours  as needed for wheezing or shortness of breath.    . Alcohol Swabs (B-D SINGLE USE SWABS REGULAR) PADS Test sugars daily    . aspirin EC 81 MG tablet Take 1 tablet (81 mg total) by mouth daily.    . blood glucose meter kit and supplies Accu-check Nano smartview meter    . Blood Glucose Monitoring Suppl (ACCU-CHEK AVIVA PLUS) w/Device KIT Test sugars daily    . cetirizine (ZYRTEC) 10 MG tablet Take 1 tablet (10 mg total) by mouth daily.    . cholecalciferol (VITAMIN  D) 1000 units tablet Take 1 tablet (1,000 Units total) by mouth daily.    . fluticasone (FLONASE) 50 MCG/ACT nasal spray Place 2 sprays into both nostrils daily.    . furosemide (LASIX) 40 MG tablet TAKE 1 TABLET TWICE DAILY AS NEEDED FOR  SWELLING, FLUID    . gabapentin (NEURONTIN) 300 MG capsule Take 1 to 2 caps twice daily OR 1 cap every morning & 2 caps every evening    . glucose blood (ACCU-CHEK AVIVA PLUS) test strip Test sugars daily    . lisinopril (PRINIVIL,ZESTRIL) 10 MG tablet Take 1 tablet (10 mg total) by mouth daily.    . meloxicam (MOBIC) 7.5 MG tablet TAKE 1 TABLET (7.5 MG TOTAL) BY MOUTH DAILY AS NEEDED FOR PAIN.    . nitroGLYCERIN (NITROSTAT) 0.4 MG SL tablet Place 1 tablet (0.4 mg total) under the tongue every 5 (five) minutes as needed for chest pain.    . Omega-3 Fatty Acids (FISH OIL PO) Take 1 capsule by mouth daily.     . pantoprazole (PROTONIX) 40 MG tablet Take 1 tablet (40 mg total) by mouth daily.    . pramipexole (MIRAPEX) 0.25 MG tablet Take 2 tablets by mouth at bedtime    . pravastatin (PRAVACHOL) 80 MG tablet Take 1 tablet (80 mg total) by mouth daily.    . sertraline (ZOLOFT) 100 MG tablet Take 100 mg by mouth daily.     Marland Kitchen triamcinolone (KENALOG) 0.025 % ointment Apply 1 application topically 2 (two) times daily.    .       Review of Systems PER HPI OTHERWISE ALL SYSTEMS ARE NEGATIVE.    Objective:   Physical Exam  TELEPHONE VISIT DUE TO COVID 19, VISIT IS CONDUCTED VIRTUALLY AND WAS REQUESTED BY PATIENT.     Assessment & Plan:

## 2018-08-23 NOTE — Assessment & Plan Note (Signed)
SYMPTOMS NOT IDEALLY CONTROLLED AND LIKELY DUE TO UNCONTROLLED GERD, ESOPHAGEAL MOTILITY DISORDER, LESS LIKELY PEPTIC STRICTURE OR SCHATZKI'S RING. LAST EGD/DIL #16 SAVARY IN 2012. WEIGHT STABLE.  DRINK WATER TO KEEP YOUR URINE LIGHT YELLOW. FOLLOW A SOFT MECHANICAL DIET.  MEATS SHOULD BE GROUND ONLY. NO FRIED FOODS. DO NOT EAT CHUNKS OF ANYTHING.  HANDOUT GIVEN. AVOID REFLUX TRIGGERS.  HANDOUT GIVEN. CONTINUE PROTONIX. TAKE 30 MINUTES PRIOR TO BREAKFAST. COMPLETE BARIUM SWALLOW TO SEE IF YOUR ESOPHAGUS IS WEAK AFTER JUN 1. FOLLOW UP IN 4 MOS.

## 2018-08-23 NOTE — Patient Instructions (Addendum)
DRINK WATER TO KEEP YOUR URINE LIGHT YELLOW.  FOLLOW A SOFT MECHANICAL DIET.  MEATS SHOULD BE GROUND ONLY. NO FRIED FOODS. DO NOT EAT CHUNKS OF ANYTHING. SEE INFO BELOW.   AVOID REFLUX TRIGGERS. SEE INFO BELOW.  CONTINUE PROTONIX. TAKE 30 MINUTES PRIOR TO BREAKFAST.  COMPLETE BARIUM SWALLOW TO SEE IF YOUR ESOPHAGUS IS WEAK AFTER JUN 1.   FOLLOW UP IN 4 MOS.    SOFT MECHANICAL DIET This SOFT MECHANICAL DIET is restricted to:  Foods that are moist, soft-textured, and easy to chew and swallow.   Meats that are ground. Meats are moist with gravy or sauce added.   Foods that do not include bread or bread-like textures except soft pancakes, well-moistened with syrup or sauce.   Textures with some chewing ability required.   Casseroles without rice.   Cooked vegetables that are less than half an inch in size and easily mashed with a fork. No cooked corn, peas, broccoli, cauliflower, cabbage, Brussels sprouts, asparagus, or other fibrous, non-tender or rubbery cooked vegetables.   Canned fruit except for pineapple. Fruit must be cut into pieces no larger than half an inch in size.   Foods that do not include nuts, seeds, coconut, or sticky textures.

## 2018-08-23 NOTE — Assessment & Plan Note (Signed)
SYMPTOMS NOT IDEALLY CONTROLLED.  DRINK WATER TO KEEP YOUR URINE LIGHT YELLOW. FOLLOW A SOFT MECHANICAL DIET.  MEATS SHOULD BE GROUND ONLY. NO FRIED FOODS. DO NOT EAT CHUNKS OF ANYTHING.  HANDOUT GIVEN. AVOID REFLUX TRIGGERS.  HANDOUT GIVEN. CONTINUE PROTONIX. TAKE 30 MINUTES PRIOR TO BREAKFAST.  FOLLOW UP IN 4 MOS.

## 2018-08-24 NOTE — Progress Notes (Signed)
ON RECALL  °

## 2018-08-24 NOTE — Progress Notes (Signed)
CC'D TO PCP °

## 2018-09-25 ENCOUNTER — Other Ambulatory Visit: Payer: Self-pay

## 2018-09-25 ENCOUNTER — Telehealth: Payer: Self-pay | Admitting: Family Medicine

## 2018-09-25 ENCOUNTER — Ambulatory Visit (HOSPITAL_COMMUNITY)
Admission: RE | Admit: 2018-09-25 | Discharge: 2018-09-25 | Disposition: A | Discharge status: Home / Self Care | Payer: Medicare Other | Source: Ambulatory Visit | Attending: Gastroenterology | Admitting: Gastroenterology

## 2018-09-25 DIAGNOSIS — R1310 Dysphagia, unspecified: Secondary | ICD-10-CM | POA: Insufficient documentation

## 2018-09-25 DIAGNOSIS — R1319 Other dysphagia: Secondary | ICD-10-CM

## 2018-09-25 NOTE — Progress Notes (Signed)
I tried to call pt and line was busy.

## 2018-09-26 NOTE — Progress Notes (Signed)
PT said she does not do MY CHART. She is aware of the soft mechanical diet.

## 2018-09-28 ENCOUNTER — Telehealth: Payer: Self-pay | Admitting: Family Medicine

## 2018-10-03 ENCOUNTER — Other Ambulatory Visit: Payer: Self-pay | Admitting: Family Medicine

## 2018-10-09 NOTE — Telephone Encounter (Signed)
No return call from pt

## 2018-10-12 ENCOUNTER — Other Ambulatory Visit: Payer: Self-pay | Admitting: Family Medicine

## 2018-10-12 MED ORDER — VITAMIN D (CHOLECALCIFEROL) 25 MCG (1000 UT) PO TABS: 25.0000 mg | ORAL_TABLET | Freq: Every day | ORAL | 0 refills | Status: DC

## 2018-10-12 NOTE — Telephone Encounter (Signed)
Pt aware refill sent to pharmacy 

## 2018-10-13 ENCOUNTER — Encounter: Payer: Self-pay | Admitting: *Deleted

## 2018-10-18 ENCOUNTER — Other Ambulatory Visit: Payer: Self-pay | Admitting: Family Medicine

## 2018-10-18 DIAGNOSIS — E785 Hyperlipidemia, unspecified: Secondary | ICD-10-CM

## 2018-10-18 DIAGNOSIS — K219 Gastro-esophageal reflux disease without esophagitis: Secondary | ICD-10-CM

## 2018-10-18 DIAGNOSIS — G2581 Restless legs syndrome: Secondary | ICD-10-CM

## 2018-10-18 DIAGNOSIS — I10 Essential (primary) hypertension: Secondary | ICD-10-CM

## 2018-10-18 MED ORDER — PANTOPRAZOLE SODIUM 40 MG PO TBEC: 40.0000 mg | DELAYED_RELEASE_TABLET | Freq: Every day | ORAL | 1 refills | Status: DC

## 2018-10-18 MED ORDER — PRAVASTATIN SODIUM 80 MG PO TABS: 80.0000 mg | ORAL_TABLET | Freq: Every day | ORAL | 1 refills | Status: DC

## 2018-10-18 MED ORDER — SERTRALINE HCL 100 MG PO TABS: 100.0000 mg | ORAL_TABLET | Freq: Every day | ORAL | 1 refills | Status: DC

## 2018-10-18 MED ORDER — LISINOPRIL 10 MG PO TABS: 10.0000 mg | ORAL_TABLET | Freq: Every day | ORAL | 1 refills | Status: DC

## 2018-10-18 NOTE — Telephone Encounter (Signed)
What is the name of the medication? Lisinopril 10mg ,Pantaprazole 40mg ,Pravastatin 80mg , Setraline 100mg .  Have you contacted your pharmacy to request a refill? Yes  Which pharmacy would you like this sent to? [facility]   Patient notified that their request is being sent to the clinical staff for review and that they should receive a call once it is complete. If they do not receive a call within 24 hours they can check with their pharmacy or our office.

## 2018-10-18 NOTE — Telephone Encounter (Signed)
Refills sent over as requested since pt has follow up appt with Monia Pouch 11/14/18.

## 2018-10-24 ENCOUNTER — Telehealth: Payer: Self-pay | Admitting: Family Medicine

## 2018-10-25 ENCOUNTER — Telehealth: Payer: Self-pay | Admitting: Family Medicine

## 2018-10-25 ENCOUNTER — Telehealth: Payer: Self-pay | Admitting: *Deleted

## 2018-10-25 NOTE — Telephone Encounter (Signed)
Patient is trying to get a L[place] Alert for herself. She contacted [facility] and they told her they didn't cover it but her [employer]ARP would. She needs a letter stating that she would benefit from [place] and she had a recent fall where she laid for several hours and the police had to break in to help her. Needs it faxed to [facility]. The phone number for them is [phone]

## 2018-10-25 NOTE — Telephone Encounter (Signed)
Called patient. She was already aware she would need to have COVID-19 testing prior to procedure. She is scheduled for 7/16 at 12:00pm. Patient aware will need to remain quarantined after testing until procedure is done. She voiced understanding. Patient aware of location for testing.

## 2018-10-26 ENCOUNTER — Encounter: Payer: Self-pay | Admitting: Family Medicine

## 2018-10-26 NOTE — Telephone Encounter (Signed)
Left detailed message that we will cancel the order for life alert and to call back with any other questions or concerns.

## 2018-10-26 NOTE — Telephone Encounter (Signed)
Letter printed. Thanks!

## 2018-10-27 ENCOUNTER — Other Ambulatory Visit: Payer: Self-pay

## 2018-10-27 ENCOUNTER — Telehealth: Payer: Self-pay | Admitting: Family Medicine

## 2018-10-27 ENCOUNTER — Encounter: Payer: Self-pay | Admitting: Family Medicine

## 2018-10-27 ENCOUNTER — Ambulatory Visit (INDEPENDENT_AMBULATORY_CARE_PROVIDER_SITE_OTHER): Payer: Medicare Other | Admitting: Family Medicine

## 2018-10-27 DIAGNOSIS — N3090 Cystitis, unspecified without hematuria: Secondary | ICD-10-CM | POA: Diagnosis not present

## 2018-10-27 MED ORDER — NITROFURANTOIN MONOHYD MACRO 100 MG PO CAPS: 100.0000 mg | ORAL_CAPSULE | Freq: Two times a day (BID) | ORAL | 0 refills | Status: DC

## 2018-10-27 NOTE — Telephone Encounter (Signed)
Phone call taken care of in different encounter.  This encounter will now be closed  

## 2018-10-27 NOTE — Progress Notes (Signed)
Subjective:    Patient ID: Allison Rollins, female    DOB: [birth year], 68 y.o.   MRN: [id number]   HPI: Allison Rollins is a 68 y.o. female presenting for pelvic pressure. No burning. Occurs when she needs to urinate. Hurts to pee, but it relieves the pressure for a while until she needs to go again. AZO didn't help. Denies fever, nausea. No other symptoms.   Depression screen [facility] 2/9 05/08/2018 02/20/2018 02/03/2018 01/12/2018 12/30/2017  Decreased Interest 0 0 0 0 2  Down, Depressed, Hopeless 0 1 1 1 2   PHQ - 2 Score 0 1 1 1 4   Altered sleeping - - 1 - 1  Tired, decreased energy - - 1 - 1  Change in appetite - - 1 - 1  Feeling bad or failure about yourself  - - 1 - 1  Trouble concentrating - - 0 - 0  Moving slowly or fidgety/restless - - 1 - 1  Suicidal thoughts - - 0 - 0  PHQ-9 Score - - 6 - 9  Difficult doing work/chores - - - - Somewhat difficult  Some recent data might be hidden     Relevant past medical, surgical, family and social history reviewed and updated as indicated.  Interim medical history since our last visit reviewed. Allergies and medications reviewed and updated.  ROS:  Review of Systems  Constitutional: Negative for chills, diaphoresis and fever.  HENT: Negative for congestion.   Eyes: Negative for visual disturbance.  Respiratory: Negative for cough and shortness of breath.   Cardiovascular: Negative for chest pain and palpitations.  Gastrointestinal: Negative for constipation, diarrhea and nausea.  Genitourinary: Positive for dysuria, frequency and urgency. Negative for decreased urine volume, flank pain, hematuria, menstrual problem and pelvic pain.  Musculoskeletal: Negative for arthralgias and joint swelling.  Skin: Negative for rash.  Neurological: Negative for dizziness and numbness.     Social History   Tobacco Use  Smoking Status Former Smoker  . Packs/day: 0.25  . Years: 30.00  . Pack years: 7.50  . Types: Cigarettes  . Start date:  01/04/1965  . Quit date: 01/03/1995  . Years since quitting: 23.8  Smokeless Tobacco Never Used       Objective:     Wt Readings from Last 3 Encounters:  05/08/18 233 lb 12.8 oz (106.1 kg)  02/20/18 240 lb (108.9 kg)  02/03/18 242 lb (109.8 kg)     Exam deferred. Pt. Harbor[place] due to COVID 19. Phone visit performed.   Assessment & Plan:   1. Cystitis     Meds ordered this encounter  Medications  . nitrofurantoin, macrocrystal-monohydrate, (MACROBID) 100 MG capsule    Sig: Take 1 capsule (100 mg total) by mouth 2 (two) times daily.    Dispense:  14 capsule    Refill:  0        Diagnoses and all orders for this visit:  Cystitis  Other orders -     nitrofurantoin, macrocrystal-monohydrate, (MACROBID) 100 MG capsule; Take 1 capsule (100 mg total) by mouth 2 (two) times daily.    Virtual Visit via telephone Note  I discussed the limitations, risks, security and privacy concerns of performing an evaluation and management service by telephone and the availability of in person appointments. The patient was identified with two identifiers. Pt.expressed understanding and agreed to proceed. Pt. Is at home. Dr. Livia Snellen is in his office.  Follow Up Instructions:   I discussed the assessment and treatment plan with  the patient. The patient was provided an opportunity to ask questions and all were answered. The patient agreed with the plan and demonstrated an understanding of the instructions.   The patient was advised to call back or seek an in-person evaluation if the symptoms worsen or if the condition fails to improve as anticipated.   Total minutes including chart review and phone contact time: 12   Follow up plan: No follow-ups on file.  Mechele ClaudeWarren Riggins Cisek, MD [facility]

## 2018-10-27 NOTE — Telephone Encounter (Signed)
Tele visit made for today

## 2018-11-09 ENCOUNTER — Other Ambulatory Visit (HOSPITAL_COMMUNITY)
Admission: RE | Admit: 2018-11-09 | Discharge: 2018-11-09 | Disposition: A | Discharge status: Home / Self Care | Payer: Medicare Other | Source: Ambulatory Visit | Attending: Gastroenterology | Admitting: Gastroenterology

## 2018-11-09 ENCOUNTER — Other Ambulatory Visit: Payer: Self-pay

## 2018-11-09 DIAGNOSIS — Z1159 Encounter for screening for other viral diseases: Secondary | ICD-10-CM | POA: Insufficient documentation

## 2018-11-09 LAB — SARS CORONAVIRUS 2 (TAT 6-24 HRS): SARS Coronavirus 2: NEGATIVE

## 2018-11-10 ENCOUNTER — Ambulatory Visit: Payer: Medicare Other | Admitting: Family Medicine

## 2018-11-13 ENCOUNTER — Other Ambulatory Visit: Payer: Self-pay

## 2018-11-13 ENCOUNTER — Encounter (HOSPITAL_COMMUNITY)
Admission: RE | Disposition: A | Discharge status: Home / Self Care | Payer: Self-pay | Source: Home / Self Care | Attending: Gastroenterology

## 2018-11-13 ENCOUNTER — Ambulatory Visit (HOSPITAL_COMMUNITY)
Admission: RE | Admit: 2018-11-13 | Discharge: 2018-11-13 | Disposition: A | Discharge status: Home / Self Care | Payer: Medicare Other | Attending: Gastroenterology | Admitting: Gastroenterology

## 2018-11-13 ENCOUNTER — Encounter (HOSPITAL_COMMUNITY): Payer: Self-pay | Admitting: *Deleted

## 2018-11-13 DIAGNOSIS — G20 Parkinson's disease: Secondary | ICD-10-CM | POA: Insufficient documentation

## 2018-11-13 DIAGNOSIS — K319 Disease of stomach and duodenum, unspecified: Secondary | ICD-10-CM | POA: Insufficient documentation

## 2018-11-13 DIAGNOSIS — Z791 Long term (current) use of non-steroidal anti-inflammatories (NSAID): Secondary | ICD-10-CM | POA: Diagnosis not present

## 2018-11-13 DIAGNOSIS — F419 Anxiety disorder, unspecified: Secondary | ICD-10-CM | POA: Diagnosis not present

## 2018-11-13 DIAGNOSIS — Z8249 Family history of ischemic heart disease and other diseases of the circulatory system: Secondary | ICD-10-CM | POA: Insufficient documentation

## 2018-11-13 DIAGNOSIS — J449 Chronic obstructive pulmonary disease, unspecified: Secondary | ICD-10-CM | POA: Insufficient documentation

## 2018-11-13 DIAGNOSIS — R1310 Dysphagia, unspecified: Secondary | ICD-10-CM

## 2018-11-13 DIAGNOSIS — F329 Major depressive disorder, single episode, unspecified: Secondary | ICD-10-CM | POA: Diagnosis not present

## 2018-11-13 DIAGNOSIS — Z87891 Personal history of nicotine dependence: Secondary | ICD-10-CM | POA: Diagnosis not present

## 2018-11-13 DIAGNOSIS — K2970 Gastritis, unspecified, without bleeding: Secondary | ICD-10-CM | POA: Diagnosis not present

## 2018-11-13 DIAGNOSIS — I509 Heart failure, unspecified: Secondary | ICD-10-CM | POA: Insufficient documentation

## 2018-11-13 DIAGNOSIS — K2960 Other gastritis without bleeding: Secondary | ICD-10-CM

## 2018-11-13 DIAGNOSIS — R1319 Other dysphagia: Secondary | ICD-10-CM

## 2018-11-13 DIAGNOSIS — I110 Hypertensive heart disease with heart failure: Secondary | ICD-10-CM | POA: Diagnosis not present

## 2018-11-13 DIAGNOSIS — E119 Type 2 diabetes mellitus without complications: Secondary | ICD-10-CM | POA: Diagnosis not present

## 2018-11-13 HISTORY — DX: Headache, unspecified: R51.9

## 2018-11-13 HISTORY — PX: SAVORY DILATION: SHX5439

## 2018-11-13 HISTORY — PX: ESOPHAGOGASTRODUODENOSCOPY: SHX5428

## 2018-11-13 HISTORY — DX: Prediabetes: R73.03

## 2018-11-13 SURGERY — EGD (ESOPHAGOGASTRODUODENOSCOPY)
Anesthesia: Moderate Sedation

## 2018-11-13 MED ORDER — MIDAZOLAM HCL 5 MG/5ML IJ SOLN
INTRAMUSCULAR | Status: AC
  Filled 2018-11-13: qty 10

## 2018-11-13 MED ORDER — MEPERIDINE HCL 100 MG/ML IJ SOLN
INTRAMUSCULAR | Status: AC
  Filled 2018-11-13: qty 2

## 2018-11-13 MED ORDER — STERILE WATER FOR IRRIGATION IR SOLN
Status: DC | PRN
  Administered 2018-11-13: 1.5 mL

## 2018-11-13 MED ORDER — MIDAZOLAM HCL 5 MG/5ML IJ SOLN
INTRAMUSCULAR | Status: DC | PRN
  Administered 2018-11-13 (×2): 2 mg via INTRAVENOUS

## 2018-11-13 MED ORDER — MEPERIDINE HCL 100 MG/ML IJ SOLN
INTRAMUSCULAR | Status: DC | PRN
  Administered 2018-11-13: 50 mg

## 2018-11-13 MED ORDER — LIDOCAINE VISCOUS HCL 2 % MT SOLN
OROMUCOSAL | Status: AC
  Filled 2018-11-13: qty 15

## 2018-11-13 MED ORDER — LIDOCAINE VISCOUS HCL 2 % MT SOLN
OROMUCOSAL | Status: DC | PRN
  Administered 2018-11-13: 1 via OROMUCOSAL

## 2018-11-13 MED ORDER — SODIUM CHLORIDE 0.9 % IV SOLN
INTRAVENOUS | Status: DC
  Administered 2018-11-13: 1000 mL via INTRAVENOUS

## 2018-11-13 MED ORDER — MINERAL OIL PO OIL
TOPICAL_OIL | ORAL | Status: AC
  Filled 2018-11-13: qty 30

## 2018-11-13 NOTE — Op Note (Signed)
[facility] Patient Name: Allison Rollins Procedure Date: 11/13/2018 1:00 PM MRN: [id number] Date of Birth: 1950-11-23 Attending MD: Barney Drain MD, MD CSN: [id number] Age: 68 Admit Type: Outpatient Procedure:                Upper GI endoscopy WITH COLD FORCEPS                            BIOPSY/ESOPHAGEAL DILATION Indications:              Dysphagia Providers:                Barney Drain MD, MD, Gerome Sam, RN, Nelma Rothman, Technician Referring MD:             Connye Burkitt. Rakes Medicines:                Meperidine 50 mg IV, Midazolam 4 mg IV Complications:            No immediate complications. Estimated Blood Loss:     Estimated blood loss was minimal. Procedure:                Pre-Anesthesia Assessment:                           - Prior to the procedure, a History and Physical                            was performed, and patient medications and                            allergies were reviewed. The patient's tolerance of                            previous anesthesia was also reviewed. The risks                            and benefits of the procedure and the sedation                            options and risks were discussed with the patient.                            All questions were answered, and informed consent                            was obtained. Prior Anticoagulants: The patient has                            taken no previous anticoagulant or antiplatelet                            agents except for NSAID medication. ASA Grade  Assessment: II - A patient with mild systemic                            disease. After reviewing the risks and benefits,                            the patient was deemed in satisfactory condition to                            undergo the procedure. After obtaining informed                            consent, the endoscope was passed under direct                            vision.  Throughout the procedure, the patient's                            blood pressure, pulse, and oxygen saturations were                            monitored continuously. The GIF-[id number] ([id number]                            scope was introduced through the mouth, and                            advanced to the second part of duodenum. The upper                            GI endoscopy was accomplished without difficulty.                            The patient tolerated the procedure well. Scope In: 1:14:15 PM Scope Out: 1:23:42 PM Total Procedure Duration: 0 hours 9 minutes 27 seconds  Findings:      No endoscopic abnormality was evident in the esophagus to explain the       patient's complaint of dysphagia. It was decided, however, to proceed       with dilation of the entire esophagus DUE TO POSSIBLE PROXIMAL       ESOPHAGEAL WEB. A guidewire was placed and the scope was withdrawn.       Dilation was performed with a Savary dilator with mild resistance at 16       mm and 17 mm.      Localized mild inflammation characterized by congestion (edema),       erosions and erythema was found in the gastric body and in the gastric       antrum. Biopsies(2: BODY, 1:INCISURA, 6:ANTRUM) were taken with a cold       forceps for Helicobacter pylori testing.      A medium non-bleeding diverticulum was found in the second portion of       the duodenum.      The duodenal bulb was normal. Impression:               -  No endoscopic esophageal abnormality to explain                            patient's dysphagia. Esophagus dilated. Dilated.                           - MODERATE EROSIVE/NODULAR Gastritis. Biopsied. Moderate Sedation:      Moderate (conscious) sedation was administered by the endoscopy nurse       and supervised by the endoscopist. The following parameters were       monitored: oxygen saturation, heart rate, blood pressure, and response       to care. Total physician intraservice time was 18  minutes. Recommendation:           - Patient has a contact number available for                            emergencies. The signs and symptoms of potential                            delayed complications were discussed with the                            patient. Return to normal activities tomorrow.                            Written discharge instructions were provided to the                            patient.                           - High fiber diet.                           - Continue present medications.                           - Await pathology results.                           - Return to my office in 4 months. Procedure Code(s):        --- Professional ---                           [id number], Esophagogastroduodenoscopy, flexible,                            transoral; with insertion of guide wire followed by                            passage of dilator(s) through esophagus over guide                            wire  [id number], 59, Esophagogastroduodenoscopy, flexible,                            transoral; with biopsy, single or multiple                           G0500, Moderate sedation services provided by the                            same physician or other qualified health care                            professional performing a gastrointestinal                            endoscopic service that sedation supports,                            requiring the presence of an independent trained                            observer to assist in the monitoring of the                            patient's level of consciousness and physiological                            status; initial 15 minutes of intra-service time;                            patient age 70 years or older (additional time may                            be reported with [id number], as appropriate) Diagnosis Code(s):        --- Professional ---                           R13.10, Dysphagia,  unspecified                           K29.70, Gastritis, unspecified, without bleeding CPT copyright 2019 American Medical Association. All rights reserved. The codes documented in this report are preliminary and upon coder review may  be revised to meet current compliance requirements. Jonette EvaSandi Criag Wicklund, MD Jonette EvaSandi Arwin Bisceglia MD, MD 11/13/2018 1:39:19 PM This report has been signed electronically. Number of Addenda: 0

## 2018-11-13 NOTE — H&P (Signed)
Primary Care Physician:  Baruch Gouty, FNP Primary Gastroenterologist:  Dr. Oneida Alar  Pre-Procedure History & Physical: HPI:  Allison Rollins is a 68 y.o. female here for DYSPHAGIA.  Past Medical History:  Diagnosis Date  . Anxiety   . Asthma   . Chest pain 12/22/2015  . CHF (congestive heart failure) ([place])   . COPD (chronic obstructive pulmonary disease) ([place])   . Depression   . Diabetes mellitus without complication ([place])   . Headache   . HTN (hypertension)   . Hypoglycemia   . Hypokalemia 07/28/2013  . Kidney stones   . Parkinson's disease   . Pneumonia   . Pre-diabetes   . S/P colonoscopy 10/15/04   normal    Past Surgical History:  Procedure Laterality Date  . CHOLECYSTECTOMY    . COLONOSCOPY  10/15/2004   Normal rectum/Normal colon  . COLONOSCOPY N/A 05/10/2016   Procedure: COLONOSCOPY;  Surgeon: Danie Binder, MD;  Location: [facility];  Service: Endoscopy;  Laterality: N/A;  10:30  . ESOPHAGOGASTRODUODENOSCOPY  09/11/2010   Dysphagia likely multifactorial (possible Candida esophagitis, likely nonspecific esophageal motility disorder, and/or uncontrolled gastroesophageal reflux disease), status post empiric dilation  . KIDNEY STONE SURGERY      Prior to Admission medications   Medication Sig Start Date End Date Taking? Authorizing Provider  albuterol (PROVENTIL HFA;VENTOLIN HFA) 108 (90 Base) MCG/ACT inhaler Inhale 2 puffs into the lungs every 6 (six) hours as needed for wheezing. 01/12/18  Yes Rakes, Connye Burkitt, FNP  albuterol (PROVENTIL) (2.5 MG/3ML) 0.083% nebulizer solution Take 3 mLs (2.5 mg total) by nebulization every 6 (six) hours as needed for wheezing or shortness of breath. 01/12/18  Yes Rakes, Connye Burkitt, FNP  aspirin EC 81 MG tablet Take 1 tablet (81 mg total) by mouth daily. 12/23/15  Yes Kathie Dike, MD  cetirizine (ZYRTEC) 10 MG tablet Take 1 tablet (10 mg total) by mouth daily. 07/20/18  Yes Rakes, Connye Burkitt, FNP  fluticasone (FLONASE) 50 MCG/ACT nasal  spray Place 2 sprays into both nostrils daily. Patient taking differently: Place 2 sprays into both nostrils at bedtime.  07/20/18  Yes Rakes, Connye Burkitt, FNP  furosemide (LASIX) 40 MG tablet TAKE 1 TABLET TWICE DAILY AS NEEDED FOR  SWELLING, FLUID Patient taking differently: Take 40 mg by mouth daily as needed (fluid retention.).  05/04/18  Yes Eustaquio Maize, MD  gabapentin (NEURONTIN) 300 MG capsule Take 1 to 2 caps twice daily OR 1 cap every morning & 2 caps every evening Patient taking differently: Take 300-600 mg by mouth See admin instructions. Take 1 capsule (300 mg) by mouth in the morning & take 2 capsules (600 mg) by mouth at night. 07/05/18  Yes Rakes, Connye Burkitt, FNP  lisinopril (ZESTRIL) 10 MG tablet Take 1 tablet (10 mg total) by mouth daily. 10/18/18  Yes Rakes, Connye Burkitt, FNP  meloxicam (MOBIC) 7.5 MG tablet TAKE 1 TABLET (7.5 MG TOTAL) BY MOUTH DAILY AS NEEDED FOR PAIN. 04/14/18  Yes Eustaquio Maize, MD  Multiple Vitamin (MULTIVITAMIN WITH MINERALS) TABS tablet Take 1 tablet by mouth daily.   Yes [provider]  Omega-3 Fatty Acids (FISH OIL PO) Take 1 capsule by mouth daily.    Yes [provider]  pantoprazole (PROTONIX) 40 MG tablet Take 1 tablet (40 mg total) by mouth daily. 10/18/18  Yes Rakes, Connye Burkitt, FNP  potassium chloride SA (K-DUR) 20 MEQ tablet Take 20 mEq by mouth daily as needed (with lasix).  Yes [provider]  pramipexole (MIRAPEX) 0.25 MG tablet Take 2 tablets by mouth at bedtime Patient taking differently: Take 0.5 mg by mouth at bedtime.  10/18/18  Yes Rakes, Connye Burkitt, FNP  pravastatin (PRAVACHOL) 80 MG tablet Take 1 tablet (80 mg total) by mouth daily. 10/18/18  Yes Rakes, Connye Burkitt, FNP  sertraline (ZOLOFT) 100 MG tablet Take 1 tablet (100 mg total) by mouth daily. Patient taking differently: Take 100 mg by mouth at bedtime.  10/18/18  Yes Rakes, Connye Burkitt, FNP  Vitamin D, Cholecalciferol, 25 MCG (1000 UT) TABS Take 25 mg by mouth daily. Patient  taking differently: Take 1,000 Units by mouth daily.  10/12/18  Yes Rakes, Connye Burkitt, FNP  acetaminophen (TYLENOL) 500 MG tablet Take 500-1,000 mg by mouth every 6 (six) hours as needed for moderate pain (pain.).     [provider]  Alcohol Swabs (B-D SINGLE USE SWABS REGULAR) PADS Test sugars daily 05/24/18   Rakes, Connye Burkitt, FNP  blood glucose meter kit and supplies Accu-check Nano smartview meter 05/19/16   Eustaquio Maize, MD  Blood Glucose Monitoring Suppl (ACCU-CHEK AVIVA PLUS) w/Device KIT Test sugars daily 05/24/18   Baruch Gouty, FNP  furosemide (LASIX) 20 MG tablet Take 1 tablet by mouth every day as needed Patient not taking: Reported on 11/07/2018 10/03/18   Baruch Gouty, FNP  glucose blood (ACCU-CHEK AVIVA PLUS) test strip Test sugars daily 06/07/18   Rakes, Connye Burkitt, FNP  nitrofurantoin, macrocrystal-monohydrate, (MACROBID) 100 MG capsule Take 1 capsule (100 mg total) by mouth 2 (two) times daily. Patient not taking: Reported on 11/07/2018 10/27/18   Claretta Fraise, MD  nitroGLYCERIN (NITROSTAT) 0.4 MG SL tablet Place 1 tablet (0.4 mg total) under the tongue every 5 (five) minutes as needed for chest pain. 12/25/15   Eustaquio Maize, MD    Allergies as of 09/25/2018 - Review Complete 08/23/2018  Allergen Reaction Noted  . Penicillins Itching and Rash 09/08/2010  . Sulfa antibiotics Rash 07/01/2011    Family History  Problem Relation Age of Onset  . Coronary artery disease Father   . COPD Mother   . Asthma Brother   . Coronary artery disease Sister   . Diabetes Sister   . Coronary artery disease Brother   . Coronary artery disease Sister     Social History   Socioeconomic History  . Marital status: Widowed    Spouse name: Not on file  . Number of children: 2  . Years of education: Not on file  . Highest education level: Not on file  Occupational History  . Occupation: disabled  Social Needs  . Financial resource strain: Not on file  . Food insecurity    Worry:  Not on file    Inability: Not on file  . Transportation needs    Medical: Not on file    Non-medical: Not on file  Tobacco Use  . Smoking status: Former Smoker    Packs/day: 0.25    Years: 30.00    Pack years: 7.50    Types: Cigarettes    Start date: 01/04/1965    Quit date: 01/03/1995    Years since quitting: 23.8  . Smokeless tobacco: Never Used  Substance and Sexual Activity  . Alcohol use: No    Alcohol/week: 0.0 standard drinks  . Drug use: No  . Sexual activity: Not on file  Lifestyle  . Physical activity    Days per week: Not on file  Minutes per session: Not on file  . Stress: Not on file  Relationships  . Social Herbalist on phone: Not on file    Gets together: Not on file    Attends religious service: Not on file    Active member of club or organization: Not on file    Attends meetings of clubs or organizations: Not on file    Relationship status: Not on file  . Intimate partner violence    Fear of current or ex partner: Not on file    Emotionally abused: Not on file    Physically abused: Not on file    Forced sexual activity: Not on file  Other Topics Concern  . Not on file  Social History Narrative  . Not on file    Review of Systems: See HPI, otherwise negative ROS   Physical Exam: BP (!) 153/83   Pulse (!) 57   Temp 98.5 F (36.9 C) (Oral)   Resp (!) 24   Ht 5' 6"  (1.676 m)   Wt 108.9 kg   SpO2 95%   BMI 38.74 kg/m  General:   Alert,  pleasant and cooperative in NAD Head:  Normocephalic and atraumatic. Neck:  Supple; Lungs:  Clear throughout to auscultation.    Heart:  Regular rate and rhythm. Abdomen:  Soft, nontender and nondistended. Normal bowel sounds, without guarding, and without rebound.   Neurologic:  Alert and  oriented x4;  grossly normal neurologically.  Impression/Plan:     DYSPHAGIA  PLAN:  EGD/DIL TODAY.  DISCUSSED PROCEDURE, BENEFITS, & RISKS: < 1% chance of medication reaction, bleeding, perforation, or  ASPIRATION.

## 2018-11-14 ENCOUNTER — Ambulatory Visit: Payer: Medicare Other | Admitting: Family Medicine

## 2018-11-14 ENCOUNTER — Other Ambulatory Visit: Payer: Self-pay | Admitting: Family Medicine

## 2018-11-14 NOTE — Discharge Instructions (Signed)
I dilated your esophagus. I DID NOT SEE A DEFINITE NARROWING IN YOUR esophagus. You have EROSIVE gastritis. I biopsied your stomach.   DRINK WATER TO KEEP YOUR URINE LIGHT YELLOW.  CONTINUE YOUR WEIGHT LOSS EFFORTS. YOUR BODY MASS INDEX IS OVER 40 WHICH MEANS YOU ARE MORBIDLY OBESE. OBESITY IS ASSOCIATED WITH AN INCREASED FOR CIRRHOSIS AND ALL CANCERS, INCLUDING ESOPHAGEAL AND COLON CANCER  AND DECREASES YOUR LIFE EXPECTANCY 10 YEARS. I RECOMMEND YOU READ AND FOLLOW RECOMMENDATIONS BY DR. MARK HYMAN, "10-DAY DETOX DIET". IF YOU TRY AND CANNOT LOSE WEIGHT OVER THE NEXT YEAR, PLEASE CALL FOR A REFERRAL TO THE BARIATRIC WEIGHT LOSS CLINIC.  FOLLOW A LOW FAT DIET. DO NOT EAT FAST FOOD.  Do not drink SODA, ENERGY DRINKS OR DIET SODA. AVOID HIGH FRUCTOSE CORN SYRUP. DO NOT chew SUGAR FREE GUM, OR USE ARTIFICIAL SWEETENERS. USE STEVIA AS A SWEETENER.   AVOID REFLUX TRIGGERS. SEE INFO BELOW.  CONTINUE PROTONIX. TAKE 30 MINUTES PRIOR TO MEALS ONCE DAILY.   YOUR BIOPSY RESULTS WILL BE BACK IN 5 BUSINESS DAYS.   PLEASE CALL IN ONE MONTH IF YOUR SYMPTOMS ARE NOT IMPROVED.   FOLLOW UP IN 4 MOS E30 DYSPHAGIA.   UPPER ENDOSCOPY AFTER CARE Read the instructions outlined below and refer to this sheet in the next week. These discharge instructions provide you with general information on caring for yourself after you leave the hospital. While your treatment has been planned according to the most current medical practices available, unavoidable complications occasionally occur. If you have any problems or questions after discharge, call DR. Geffrey Michaelsen, [phone].  ACTIVITY  You may resume your regular activity, but move at a slower pace for the next 24 hours.   Take frequent rest periods for the next 24 hours.   Walking will help get rid of the air and reduce the bloated feeling in your belly (abdomen).   No driving for 24 hours (because of the medicine (anesthesia) used during the test).   You may  shower.   Do not sign any important legal documents or operate any machinery for 24 hours (because of the anesthesia used during the test).    NUTRITION  Drink plenty of fluids.   You may resume your normal diet as instructed by your doctor.   Begin with a light meal and progress to your normal diet. Heavy or fried foods are harder to digest and may make you feel sick to your stomach (nauseated).   Avoid alcoholic beverages for 24 hours or as instructed.    MEDICATIONS  You may resume your normal medications.   WHAT YOU CAN EXPECT TODAY  Some feelings of bloating in the abdomen.   Passage of more gas than usual.    IF YOU HAD A BIOPSY TAKEN DURING THE UPPER ENDOSCOPY:  Eat a soft diet IF YOU HAVE NAUSEA, BLOATING, ABDOMINAL PAIN, OR VOMITING.    FINDING OUT THE RESULTS OF YOUR TEST Not all test results are available during your visit. DR. Darrick PennaFIELDS WILL CALL YOU WITHIN 14 DAYS OF YOUR PROCEDUE WITH YOUR RESULTS. Do not assume everything is normal if you have not heard from DR. Aaiden Depoy, CALL HER OFFICE AT [phone].  SEEK IMMEDIATE MEDICAL ATTENTION AND CALL THE OFFICE: [phone] IF:  You have more than a spotting of blood in your stool.   Your belly is swollen (abdominal distention).   You are nauseated or vomiting.   You have a temperature over 101F.   You have abdominal pain or discomfort  that is severe or gets worse throughout the day.  DYSPHAGIA DYSPHAGIA can be caused by stomach acid backing up into the tube that carries food from the mouth down to the stomach (lower esophagus).  TREATMENT There are a number of medicines used to treat DYSPHAGIA including: Antacids.  Proton-pump inhibitors: PROTONIX TAGAMET/PEPCID    Lifestyle and home remedies to control REFLUX and regurgitation  You may eliminate or reduce the frequency of heartburn by making the following lifestyle changes:   Control your weight. Being overweight is a major risk factor for  heartburn and GERD. Excess pounds put pressure on your abdomen, pushing up your stomach and causing acid to back up into your esophagus.    Eat smaller meals. 4 TO 6 MEALS A DAY. This reduces pressure on the lower esophageal sphincter, helping to prevent the valve from opening and acid from washing back into your esophagus.    Loosen your belt. Clothes that fit tightly around your waist put pressure on your abdomen and the lower esophageal sphincter.     Eliminate heartburn triggers. Everyone has specific triggers. Common triggers such as fatty or fried foods, spicy food, tomato sauce, carbonated beverages, alcohol, chocolate, mint, garlic, onion, caffeine and nicotine may make heartburn worse.    Avoid stooping or bending. Tying your shoes is OK. Bending over for longer periods to weed your garden isn't, especially soon after eating.    Don't lie down after a meal. Wait at least three to four hours after eating before going to bed, and don't lie down right after eating.   Alternative medicine  Several home remedies exist for treating GERD, but they provide only temporary relief. They include drinking baking soda (sodium bicarbonate) added to water or drinking other fluids such as baking soda mixed with cream of tartar and water.  Although these liquids create temporary relief by neutralizing, washing away or buffering acids, eventually they aggravate the situation by adding gas and fluid to your stomach, increasing pressure and causing more acid reflux. Further, adding more sodium to your diet may increase your blood pressure and add stress to your heart, and excessive bicarbonate ingestion can alter the acid-base balance in your body.

## 2018-11-16 NOTE — Progress Notes (Signed)
PT is aware.

## 2018-11-20 ENCOUNTER — Encounter (HOSPITAL_COMMUNITY): Payer: Self-pay | Admitting: Gastroenterology

## 2018-11-20 ENCOUNTER — Telehealth: Payer: Self-pay | Admitting: Gastroenterology

## 2018-11-20 NOTE — Telephone Encounter (Signed)
[phone]  Please call patient, slf wanted her on a 10 day diet and she can not find someone to print it out for her

## 2018-11-20 NOTE — Telephone Encounter (Signed)
PT would like a copy of low fat diet and she will go to [employer] to get helping finding the book. Diet mailed.

## 2018-11-23 ENCOUNTER — Other Ambulatory Visit: Payer: Self-pay

## 2018-11-24 ENCOUNTER — Encounter: Payer: Self-pay | Admitting: Family Medicine

## 2018-11-24 ENCOUNTER — Ambulatory Visit (INDEPENDENT_AMBULATORY_CARE_PROVIDER_SITE_OTHER): Payer: Medicare Other | Admitting: Family Medicine

## 2018-11-24 VITALS — BP 112/73 | HR 65 | Temp 98.6°F | Ht 66.0 in | Wt 233.0 lb

## 2018-11-24 DIAGNOSIS — M545 Low back pain, unspecified: Secondary | ICD-10-CM

## 2018-11-24 DIAGNOSIS — E1142 Type 2 diabetes mellitus with diabetic polyneuropathy: Secondary | ICD-10-CM | POA: Diagnosis not present

## 2018-11-24 DIAGNOSIS — I10 Essential (primary) hypertension: Secondary | ICD-10-CM

## 2018-11-24 DIAGNOSIS — F339 Major depressive disorder, recurrent, unspecified: Secondary | ICD-10-CM

## 2018-11-24 DIAGNOSIS — K219 Gastro-esophageal reflux disease without esophagitis: Secondary | ICD-10-CM | POA: Diagnosis not present

## 2018-11-24 DIAGNOSIS — E1169 Type 2 diabetes mellitus with other specified complication: Secondary | ICD-10-CM

## 2018-11-24 DIAGNOSIS — E1159 Type 2 diabetes mellitus with other circulatory complications: Secondary | ICD-10-CM | POA: Diagnosis not present

## 2018-11-24 DIAGNOSIS — F411 Generalized anxiety disorder: Secondary | ICD-10-CM

## 2018-11-24 DIAGNOSIS — E559 Vitamin D deficiency, unspecified: Secondary | ICD-10-CM | POA: Insufficient documentation

## 2018-11-24 DIAGNOSIS — G8929 Other chronic pain: Secondary | ICD-10-CM

## 2018-11-24 DIAGNOSIS — B372 Candidiasis of skin and nail: Secondary | ICD-10-CM

## 2018-11-24 DIAGNOSIS — I152 Hypertension secondary to endocrine disorders: Secondary | ICD-10-CM

## 2018-11-24 DIAGNOSIS — E785 Hyperlipidemia, unspecified: Secondary | ICD-10-CM

## 2018-11-24 DIAGNOSIS — G2581 Restless legs syndrome: Secondary | ICD-10-CM | POA: Insufficient documentation

## 2018-11-24 HISTORY — DX: Vitamin D deficiency, unspecified: E55.9

## 2018-11-24 LAB — BAYER DCA HB A1C WAIVED: HB A1C (BAYER DCA - WAIVED): 6 % (ref <7.0)

## 2018-11-24 MED ORDER — MELOXICAM 7.5 MG PO TABS: 7.5000 mg | ORAL_TABLET | Freq: Every day | ORAL | 0 refills | Status: DC | PRN

## 2018-11-24 MED ORDER — NYSTATIN 100000 UNIT/GM EX CREA: 1.0000 "application " | TOPICAL_CREAM | Freq: Two times a day (BID) | CUTANEOUS | 1 refills | Status: DC

## 2018-11-24 MED ORDER — PRAMIPEXOLE DIHYDROCHLORIDE 0.25 MG PO TABS: 0.5000 mg | ORAL_TABLET | Freq: Every day | ORAL | 1 refills | Status: DC

## 2018-11-24 NOTE — Patient Instructions (Signed)
It was a pleasure seeing you today, Allison Rollins.  Information regarding what we discussed is included in this packet.  Please make an appointment to see me in 3 months.   In a few days you may receive a survey in the mail or online from [employer] regarding your visit with [place] today. Please take a moment to fill this out. Your feedback is very important to our office. It can help [place] better understand your needs as well as improve your experience and satisfaction. Thank you for taking your time to complete it. We care about you.  Because of recent events of COVID-19 ("Coronavirus"), please follow CDC recommendations:   1. Wash your hand frequently 2. Avoid touching your face 3. Stay away from people who are sick 4. If you have symptoms such as fever, cough, shortness of breath then call your healthcare provider for further guidance 5. If you are sick, STAY AT HOME, unless otherwise directed by your healthcare provider. 6. Follow directions from state and national officials regarding staying safe    Please feel free to call our office if any questions or concerns arise.  Warm Regards, Monia Pouch, FNP-C [facility] [address] [place], [place] [address] [phone]

## 2018-11-24 NOTE — Progress Notes (Signed)
Subjective:  Patient ID: Allison Rollins, female    DOB: 13-Aug-1950, 68 y.o.   MRN: [id number]  Patient Care Team: Baruch Gouty, FNP as PCP - General (Family Medicine) Danie Binder, MD (Gastroenterology) Phillips Odor, MD as Consulting Physician (Neurology) Harl Bowie Alphonse Guild, MD as Consulting Physician (Cardiology)   Chief Complaint:  Medical Management of Chronic Issues, Hyperlipidemia, Hypertension, and Diabetes   HPI: Allison Rollins is a 68 y.o. female presenting on 11/24/2018 for Medical Management of Chronic Issues, Hyperlipidemia, Hypertension, and Diabetes    1. Type 2 diabetes mellitus with diabetic polyneuropathy, without long-term current use of insulin (HCC)  Diabetes mellitus 2 Diet controlled.  Checking CBGs? Yes  Fasting avg - 68  Postprandial average - 100 Exercising regularly? - No Watching carbohydrate intake? - Yes Neuropathy ? - Yes Hypoglycemic events - No Polyuria - No Polydipsia - No Vision problems - No   2. Hyperlipidemia associated with type 2 diabetes mellitus ([place])  Compliant with statin therapy without associated side effects. Does not exercise on a regular basis. Does try to watch what she eats.    3. Hypertension associated with diabetes ([place])  Complaint with meds - Yes Checking BP at home ranging 130/75 Exercising Regularly - No Watching Salt intake - Yes Pertinent ROS:  Headache - No Chest pain - No Dyspnea - No Palpitations - No LE edema - No They report good compliance with medications and can restate their regimen by memory. No medication side effects.  BP Readings from Last 3 Encounters:  11/24/18 112/73  11/13/18 128/72  05/08/18 130/85     4. Gastroesophageal reflux disease without esophagitis  Followed by GI. Recent EGD to stretch esophagus. Tolerated well and had been doing great since procedure. Is to follow up with GI in 2 months.    5. Recurrent depression ([place])   6. Generalized anxiety disorder  Doing well  on current medications. No SI or HI.  GAD 7 : Generalized Anxiety Score 11/24/2018 09/30/2016  Nervous, Anxious, on Edge 0 1  Control/stop worrying 0 1  Worry too much - different things 0 1  Trouble relaxing 0 0  Restless 0 1  Easily annoyed or irritable 0 1  Afraid - awful might happen 0 1  Total GAD 7 Score 0 6  Anxiety Difficulty - Not difficult at all    Depression screen [facility] 2/9 11/24/2018 11/24/2018 05/08/2018 02/20/2018 02/03/2018  Decreased Interest 0 0 0 0 0  Down, Depressed, Hopeless 0 0 0 1 1  PHQ - 2 Score 0 0 0 1 1  Altered sleeping 0 - - - 1  Tired, decreased energy 0 - - - 1  Change in appetite 0 - - - 1  Feeling bad or failure about yourself  0 - - - 1  Trouble concentrating 0 - - - 0  Moving slowly or fidgety/restless 0 - - - 1  Suicidal thoughts 0 - - - 0  PHQ-9 Score 0 - - - 6  Difficult doing work/chores - - - - -  Some recent data might be hidden     7. Vitamin D deficiency  On oral repletion therapy. No muscle weakness, fatigue, or recent fractures.    8. Chronic bilateral low back pain without sciatica  Ongoing pain, well controlled with Mobic. No new or worsening symptoms. No red flags concerning for malignancy or cauda equina syndrome.    9. Restless leg  Does well with Mirapex nightly. No  new or worsening symptoms.    10. Candidal intertrigo  Pt c/o pruritic red rash under bilateral breasts. States onset several weeks ago. States this happens when it gets hot due to excessive sweating and large breasts.    11. Morbid obesity ([place])  Does not exercise on a regular basis. Does try to watch what she eats.      Relevant past medical, surgical, family, and social history reviewed and updated as indicated.  Allergies and medications reviewed and updated. Date reviewed: Chart in Epic.   Past Medical History:  Diagnosis Date  . Anxiety   . Asthma   . Chest pain 12/22/2015  . CHF (congestive heart failure) ([place])   . COPD (chronic obstructive pulmonary  disease) ([place])   . Depression   . Diabetes mellitus without complication ([place])   . Headache   . HTN (hypertension)   . Hypoglycemia   . Hypokalemia 07/28/2013  . Kidney stones   . Parkinson's disease   . Pneumonia   . Pre-diabetes   . S/P colonoscopy 10/15/04   normal    Past Surgical History:  Procedure Laterality Date  . CHOLECYSTECTOMY    . COLONOSCOPY  10/15/2004   Normal rectum/Normal colon  . COLONOSCOPY N/A 05/10/2016   Procedure: COLONOSCOPY;  Surgeon: Danie Binder, MD;  [facility];  Service: Endoscopy;  Laterality: N/A;  10:30  . ESOPHAGOGASTRODUODENOSCOPY  09/11/2010   Dysphagia likely multifactorial (possible Candida esophagitis, likely nonspecific esophageal motility disorder, and/or uncontrolled gastroesophageal reflux disease), status post empiric dilation  . ESOPHAGOGASTRODUODENOSCOPY N/A 11/13/2018   Procedure: ESOPHAGOGASTRODUODENOSCOPY (EGD);  Surgeon: Danie Binder, MD;  Location: [facility];  Service: Endoscopy;  Laterality: N/A;  12:30pm  . KIDNEY STONE SURGERY    . SAVORY DILATION N/A 11/13/2018   Procedure: SAVORY DILATION;  Surgeon: Danie Binder, MD;  Location: [facility];  Service: Endoscopy;  Laterality: N/A;    Social History   Socioeconomic History  . Marital status: Widowed    Spouse name: Not on file  . Number of children: 2  . Years of education: Not on file  . Highest education level: Not on file  Occupational History  . Occupation: disabled  Social Needs  . Financial resource strain: Not on file  . Food insecurity    Worry: Not on file    Inability: Not on file  . Transportation needs    Medical: Not on file    Non-medical: Not on file  Tobacco Use  . Smoking status: Former Smoker    Packs/day: 0.25    Years: 30.00    Pack years: 7.50    Types: Cigarettes    Start date: 01/04/1965    Quit date: 01/03/1995    Years since quitting: 23.9  . Smokeless tobacco: Never Used  Substance and Sexual Activity  . Alcohol  use: No    Alcohol/week: 0.0 standard drinks  . Drug use: No  . Sexual activity: Not on file  Lifestyle  . Physical activity    Days per week: Not on file    Minutes per session: Not on file  . Stress: Not on file  Relationships  . Social Herbalist on phone: Not on file    Gets together: Not on file    Attends religious service: Not on file    Active member of club or organization: Not on file    Attends meetings of clubs or organizations: Not on file  Relationship status: Not on file  . Intimate partner violence    Fear of current or ex partner: Not on file    Emotionally abused: Not on file    Physically abused: Not on file    Forced sexual activity: Not on file  Other Topics Concern  . Not on file  Social History Narrative  . Not on file    Outpatient Encounter Medications as of 11/24/2018  Medication Sig  . acetaminophen (TYLENOL) 500 MG tablet Take 500-1,000 mg by mouth every 6 (six) hours as needed for moderate pain (pain.).   Marland Kitchen albuterol (PROVENTIL HFA;VENTOLIN HFA) 108 (90 Base) MCG/ACT inhaler Inhale 2 puffs into the lungs every 6 (six) hours as needed for wheezing.  Marland Kitchen albuterol (PROVENTIL) (2.5 MG/3ML) 0.083% nebulizer solution Take 3 mLs (2.5 mg total) by nebulization every 6 (six) hours as needed for wheezing or shortness of breath.  . Alcohol Swabs (B-D SINGLE USE SWABS REGULAR) PADS Test sugars daily  . aspirin EC 81 MG tablet Take 1 tablet (81 mg total) by mouth daily.  . blood glucose meter kit and supplies Accu-check Nano smartview meter  . Blood Glucose Monitoring Suppl (ACCU-CHEK AVIVA PLUS) w/Device KIT Test sugars daily  . cetirizine (ZYRTEC) 10 MG tablet Take 1 tablet (10 mg total) by mouth daily.  . fluticasone (FLONASE) 50 MCG/ACT nasal spray Place 2 sprays into both nostrils daily. (Patient taking differently: Place 2 sprays into both nostrils at bedtime. )  . furosemide (LASIX) 40 MG tablet TAKE 1 TABLET TWICE DAILY AS NEEDED FOR  SWELLING,  FLUID (Patient taking differently: Take 40 mg by mouth daily as needed (fluid retention.). )  . gabapentin (NEURONTIN) 300 MG capsule Take 1 to 2 caps twice daily OR 1 cap every morning & 2 caps every evening (Patient taking differently: Take 300-600 mg by mouth See admin instructions. Take 1 capsule (300 mg) by mouth in the morning & take 2 capsules (600 mg) by mouth at night.)  . glucose blood (ACCU-CHEK AVIVA PLUS) test strip Test sugars daily  . lisinopril (ZESTRIL) 10 MG tablet Take 1 tablet (10 mg total) by mouth daily.  . meloxicam (MOBIC) 7.5 MG tablet Take 1 tablet (7.5 mg total) by mouth daily as needed for pain.  . Multiple Vitamin (MULTIVITAMIN WITH MINERALS) TABS tablet Take 1 tablet by mouth daily.  . Omega-3 Fatty Acids (FISH OIL PO) Take 1 capsule by mouth daily.   . pantoprazole (PROTONIX) 40 MG tablet Take 1 tablet (40 mg total) by mouth daily.  . potassium chloride SA (K-DUR) 20 MEQ tablet Take 20 mEq by mouth daily as needed (with lasix).  . pramipexole (MIRAPEX) 0.25 MG tablet Take 2 tablets (0.5 mg total) by mouth at bedtime.  . pravastatin (PRAVACHOL) 80 MG tablet Take 1 tablet (80 mg total) by mouth daily.  . sertraline (ZOLOFT) 100 MG tablet Take 1 tablet (100 mg total) by mouth daily. (Patient taking differently: Take 100 mg by mouth at bedtime. )  . Vitamin D, Cholecalciferol, 25 MCG (1000 UT) TABS Take 25 mg by mouth daily. (Patient taking differently: Take 1,000 Units by mouth daily. )  . [DISCONTINUED] meloxicam (MOBIC) 7.5 MG tablet TAKE 1 TABLET (7.5 MG TOTAL) BY MOUTH DAILY AS NEEDED FOR PAIN.  . [DISCONTINUED] pramipexole (MIRAPEX) 0.25 MG tablet Take 2 tablets by mouth at bedtime (Patient taking differently: Take 0.5 mg by mouth at bedtime. )  . nitroGLYCERIN (NITROSTAT) 0.4 MG SL tablet Place 1 tablet (0.4 mg total)  under the tongue every 5 (five) minutes as needed for chest pain. (Patient not taking: Reported on 11/24/2018)  . nystatin cream (MYCOSTATIN) Apply 1  application topically 2 (two) times daily.   No facility-administered encounter medications on file as of 11/24/2018.     Allergies  Allergen Reactions  . Penicillins Itching and Rash    Has patient had a PCN reaction causing immediate rash, facial/tongue/throat swelling, SOB or lightheadedness with hypotension: no Has patient had a PCN reaction causing severe rash involving mucus membranes or skin necrosis: no Has patient had a PCN reaction that required hospitalization: no Has patient had a PCN reaction occurring within the last 10 years: no If all of the above answers are "NO", then may proceed with Cephalosporin use.   . Sulfa Antibiotics Rash    Review of Systems  Constitutional: Negative for activity change, appetite change, chills, diaphoresis, fatigue, fever and unexpected weight change.  HENT: Negative.   Eyes: Negative.  Negative for photophobia and visual disturbance.  Respiratory: Negative for cough, chest tightness and shortness of breath.   Cardiovascular: Negative for chest pain, palpitations and leg swelling.  Gastrointestinal: Negative for abdominal distention, abdominal pain, anal bleeding, blood in stool, constipation, diarrhea, nausea, rectal pain and vomiting.  Endocrine: Positive for polyphagia. Negative for cold intolerance, heat intolerance, polydipsia and polyuria.  Genitourinary: Negative for decreased urine volume, difficulty urinating, dysuria, frequency and urgency.  Musculoskeletal: Positive for arthralgias and back pain. Negative for myalgias.  Skin: Positive for rash.  Allergic/Immunologic: Negative.   Neurological: Negative for dizziness, tremors, seizures, syncope, facial asymmetry, speech difficulty, weakness, light-headedness, numbness and headaches.  Hematological: Negative.   Psychiatric/Behavioral: Negative for agitation, confusion, decreased concentration, dysphoric mood, hallucinations, self-injury, sleep disturbance and suicidal ideas. The  patient is not nervous/anxious.   All other systems reviewed and are negative.       Objective:  BP 112/73   Pulse 65   Temp 98.6 F (37 C) (Oral)   Ht _0  (1.676 m)   Wt 233 lb (105.7 kg)   BMI 37.61 kg/m    Wt Readings from Last 3 Encounters:  11/24/18 233 lb (105.7 kg)  11/13/18 240 lb (108.9 kg)  05/08/18 233 lb 12.8 oz (106.1 kg)    Physical Exam Vitals signs and nursing note reviewed.  Constitutional:      General: She is not in acute distress.    Appearance: Normal appearance. She is well-developed and well-groomed. She is obese. She is not ill-appearing, toxic-appearing or diaphoretic.  HENT:     Head: Normocephalic and atraumatic.     Jaw: There is normal jaw occlusion.     Right Ear: Hearing, tympanic membrane, ear canal and external ear normal.     Left Ear: Hearing, tympanic membrane, ear canal and external ear normal.     Nose: Nose normal.     Mouth/Throat:     Lips: Pink.     Mouth: Mucous membranes are moist.     Pharynx: Oropharynx is clear. Uvula midline.  Eyes:     General: Lids are normal.     Extraocular Movements: Extraocular movements intact.     Conjunctiva/sclera: Conjunctivae normal.     Pupils: Pupils are equal, round, and reactive to light.  Neck:     Musculoskeletal: Normal range of motion and neck supple.     Thyroid: No thyroid mass, thyromegaly or thyroid tenderness.     Vascular: No carotid bruit or JVD.     Trachea: Trachea and phonation  normal.  Cardiovascular:     Rate and Rhythm: Normal rate and regular rhythm.     Chest Wall: PMI is not displaced.     Pulses: Normal pulses.     Heart sounds: Normal heart sounds. No murmur. No friction rub. No gallop.   Pulmonary:     Effort: Pulmonary effort is normal. No respiratory distress.     Breath sounds: Normal breath sounds. No wheezing.  Abdominal:     General: Abdomen is protuberant. Bowel sounds are normal. There is no distension or abdominal bruit.     Palpations: Abdomen is  soft. There is no hepatomegaly, splenomegaly, mass or pulsatile mass.     Tenderness: There is no abdominal tenderness. There is no right CVA tenderness or left CVA tenderness.     Hernia: No hernia is present.  Musculoskeletal: Normal range of motion.     Right lower leg: No edema.     Left lower leg: No edema.  Lymphadenopathy:     Cervical: No cervical adenopathy.  Skin:    General: Skin is warm and dry.     Capillary Refill: Capillary refill takes less than 2 seconds.     Coloration: Skin is not cyanotic, jaundiced or pale.     Findings: Rash present.     Comments: Beefy red rash with satellite lesions under both breasts.   Neurological:     General: No focal deficit present.     Mental Status: She is alert and oriented to person, place, and time.     Cranial Nerves: Cranial nerves are intact. No cranial nerve deficit.     Sensory: Sensation is intact. No sensory deficit.     Motor: Motor function is intact. No weakness.     Coordination: Coordination is intact. Coordination normal.     Gait: Gait is intact. Gait normal.     Deep Tendon Reflexes: Reflexes are normal and symmetric. Reflexes normal.  Psychiatric:        Attention and Perception: Attention and perception normal.        Mood and Affect: Mood and affect normal.        Speech: Speech normal.        Behavior: Behavior normal. Behavior is cooperative.        Thought Content: Thought content normal. Thought content does not include homicidal or suicidal ideation. Thought content does not include homicidal or suicidal plan.        Cognition and Memory: Cognition and memory normal.        Judgment: Judgment normal.     Results for orders placed or performed during the hospital encounter of 11/09/18  SARS Coronavirus 2 (Performed in [facility] hospital lab)   Specimen: Nasal Swab  Result Value Ref Range   SARS Coronavirus 2 NEGATIVE NEGATIVE       Pertinent labs & imaging results that were available during my care  of the patient were reviewed by me and considered in my medical decision making.  Assessment & Plan:  Verta was seen today for medical management of chronic issues, hyperlipidemia, hypertension and diabetes.  Diagnoses and all orders for this visit:  Type 2 diabetes mellitus with diabetic polyneuropathy, without long-term current use of insulin (HCC) A1C 6.0 in office today. Diet controlled. Pt aware if A1C gets any higher, we will initiate metformin. Pt aware the metformin will help with weight also.  -     Bayer DCA Hb A1c Waived  Hyperlipidemia associated with type 2 diabetes  mellitus ([place]) Diet and exercise encouraged. Labs pending. Continue medications as prescribed.  -     Lipid panel  Hypertension associated with diabetes ([place]) DASH diet and exercise encouraged. Labs pending. Continue medications as prescribed.  -     CMP14+EGFR -     Thyroid Panel With TSH  Gastroesophageal reflux disease without esophagitis Followed by GI. Keep follow up appointment.   Recurrent depression ([place]) Generalized anxiety disorder Well controlled on current therapy. No refills needed today. Report any new or worsening symptoms.   Vitamin D deficiency On oral repletion therapy. Will adjust therapy if labs warrant it.  -     VITAMIN D 25 Hydroxy (Vit-D Deficiency, Fractures)  Chronic bilateral low back pain without sciatica Well controlled with Mobic. Continue below.  -     meloxicam (MOBIC) 7.5 MG tablet; Take 1 tablet (7.5 mg total) by mouth daily as needed for pain.  Restless leg Well controlled on current medications. Continue below.  -     pramipexole (MIRAPEX) 0.25 MG tablet; Take 2 tablets (0.5 mg total) by mouth at bedtime.  Candidal intertrigo Beefy red rash with satellite lesions under both breasts. Consistent with candidal intertrigo. Symptomatic care discussed. Medications as prescribed. Report any new or worsening symptoms.  -     nystatin cream (MYCOSTATIN); Apply 1 application  topically 2 (two) times daily.  Morbid obesity ([place]) Diet and exercise encouraged. Pt has changed diet. Will continue to work on lowering weight.     Continue all other maintenance medications.  Follow up plan: Return in about 3 months (around 02/24/2019), or if symptoms worsen or fail to improve, for DM.  Educational handout given for Survey, COVID-19  The above assessment and management plan was discussed with the patient. The patient verbalized understanding of and has agreed to the management plan. Patient is aware to call the clinic if symptoms persist or worsen. Patient is aware when to return to the clinic for a follow-up visit. Patient educated on when it is appropriate to go to the emergency department.   Monia Pouch, FNP-C [facility] [phone] 11/24/18

## 2018-11-25 LAB — LIPID PANEL
Chol/HDL Ratio: 4.9 ratio — ABNORMAL HIGH (ref 0.0–4.4)
Cholesterol, Total: 219 mg/dL — ABNORMAL HIGH (ref 100–199)
HDL: 45 mg/dL (ref >39)
LDL Calculated: 135 mg/dL — ABNORMAL HIGH (ref 0–99)
Triglycerides: 196 mg/dL — ABNORMAL HIGH (ref 0–149)
VLDL Cholesterol Cal: 39 mg/dL (ref 5–40)

## 2018-11-25 LAB — CMP14+EGFR
ALT: 16 IU/L (ref 0–32)
AST: 22 IU/L (ref 0–40)
Albumin/Globulin Ratio: 1.8 (ref 1.2–2.2)
Albumin: 4.3 g/dL (ref 3.8–4.8)
Alkaline Phosphatase: 101 IU/L (ref 39–117)
BUN/Creatinine Ratio: 18 (ref 12–28)
BUN: 15 mg/dL (ref 8–27)
Bilirubin Total: 0.4 mg/dL (ref 0.0–1.2)
CO2: 23 mmol/L (ref 20–29)
Calcium: 9.6 mg/dL (ref 8.7–10.3)
Chloride: 105 mmol/L (ref 96–106)
Creatinine, Ser: 0.84 mg/dL (ref 0.57–1.00)
GFR calc Af Amer: 83 mL/min/{1.73_m2} (ref >59)
GFR calc non Af Amer: 72 mL/min/{1.73_m2} (ref >59)
Globulin, Total: 2.4 g/dL (ref 1.5–4.5)
Glucose: 85 mg/dL (ref 65–99)
Potassium: 4.7 mmol/L (ref 3.5–5.2)
Sodium: 146 mmol/L — ABNORMAL HIGH (ref 134–144)
Total Protein: 6.7 g/dL (ref 6.0–8.5)

## 2018-11-25 LAB — THYROID PANEL WITH TSH
Free Thyroxine Index: 1.9 (ref 1.2–4.9)
T3 Uptake Ratio: 24 % (ref 24–39)
T4, Total: 7.8 ug/dL (ref 4.5–12.0)
TSH: 2.35 u[IU]/mL (ref 0.450–4.500)

## 2018-11-25 LAB — VITAMIN D 25 HYDROXY (VIT D DEFICIENCY, FRACTURES): Vit D, 25-Hydroxy: 25.2 ng/mL — ABNORMAL LOW (ref 30.0–100.0)

## 2018-12-01 ENCOUNTER — Ambulatory Visit (INDEPENDENT_AMBULATORY_CARE_PROVIDER_SITE_OTHER): Payer: Medicare Other | Admitting: Cardiology

## 2018-12-01 ENCOUNTER — Encounter: Payer: Self-pay | Admitting: Cardiology

## 2018-12-01 ENCOUNTER — Other Ambulatory Visit: Payer: Self-pay

## 2018-12-01 VITALS — BP 121/76 | HR 57 | Ht 66.0 in | Wt 235.0 lb

## 2018-12-01 DIAGNOSIS — R0789 Other chest pain: Secondary | ICD-10-CM

## 2018-12-01 DIAGNOSIS — E782 Mixed hyperlipidemia: Secondary | ICD-10-CM

## 2018-12-01 DIAGNOSIS — I5032 Chronic diastolic (congestive) heart failure: Secondary | ICD-10-CM

## 2018-12-01 DIAGNOSIS — I10 Essential (primary) hypertension: Secondary | ICD-10-CM | POA: Diagnosis not present

## 2018-12-01 NOTE — Patient Instructions (Addendum)

## 2018-12-01 NOTE — Progress Notes (Signed)
Clinical Summary Ms. Ricke is a 68 y.o.female seen today for follow up of the following medical problems.   1. Chronic diastolic heart failure - some recent edema. Occasional SOB. - compliant with meds, takes lasix roughly every other day. Effective diuresis when taking - low sodium diet.   - occasional LE edema. Takes lasix prn.  - no SOB/DOE   2. HTN - compliant withmeds  3. COPD - compliant with inhalers  4. Bradycardia - chronic sinus bradycardia  - dizzizzness with getting upset or depressed only  5. Hyperlipidemia - compliant with statin.   10/2018 TC 219 TG 196 HDL 45 LDL 135  6. OSA screen - has appt with Dr Arsenio Katz in July - she reports test was negative.   7. Chest pain  - isolated episode about 1 month ag. Pressure like feeling, better with deep breaths. 3/10 in severity. Tends to occur at rest. No other associated symptoms. No exertional symptoms. Lasts just a few minutes.  - admit 11/2015 with chest pain. Nuclear stress test without significant ischemia.   - no recent symptoms   8. Prediabetes - followed by pcp  SH: has 3 grandkids, 2 sons. Husband just passed this March. Works at [employer]. One grandson will be starting at [employer] in 2 years. Granddaughter  Another grandson due Oct/Nov.   Past Medical History:  Diagnosis Date  . Anxiety   . Asthma   . Chest pain 12/22/2015  . CHF (congestive heart failure) ([place])   . COPD (chronic obstructive pulmonary disease) ([place])   . Depression   . Diabetes mellitus without complication ([place])   . Headache   . HTN (hypertension)   . Hypoglycemia   . Hypokalemia 07/28/2013  . Kidney stones   . Parkinson's disease   . Pneumonia   . Pre-diabetes   . S/P colonoscopy 10/15/04   normal     Allergies  Allergen Reactions  . Penicillins Itching and Rash    Has patient had a PCN reaction causing immediate rash, facial/tongue/throat swelling, SOB or lightheadedness with  hypotension: no Has patient had a PCN reaction causing severe rash involving mucus membranes or skin necrosis: no Has patient had a PCN reaction that required hospitalization: no Has patient had a PCN reaction occurring within the last 10 years: no If all of the above answers are "NO", then may proceed with Cephalosporin use.   . Sulfa Antibiotics Rash     Current Outpatient Medications  Medication Sig Dispense Refill  . acetaminophen (TYLENOL) 500 MG tablet Take 500-1,000 mg by mouth every 6 (six) hours as needed for moderate pain (pain.).     Marland Kitchen albuterol (PROVENTIL HFA;VENTOLIN HFA) 108 (90 Base) MCG/ACT inhaler Inhale 2 puffs into the lungs every 6 (six) hours as needed for wheezing. 1 Inhaler 0  . albuterol (PROVENTIL) (2.5 MG/3ML) 0.083% nebulizer solution Take 3 mLs (2.5 mg total) by nebulization every 6 (six) hours as needed for wheezing or shortness of breath. 150 mL 1  . Alcohol Swabs (B-D SINGLE USE SWABS REGULAR) PADS Test sugars daily 100 each 3  . aspirin EC 81 MG tablet Take 1 tablet (81 mg total) by mouth daily.    . blood glucose meter kit and supplies Accu-check Nano smartview meter 1 each 0  . Blood Glucose Monitoring Suppl (ACCU-CHEK AVIVA PLUS) w/Device KIT Test sugars daily 1 kit 0  . cetirizine (ZYRTEC) 10 MG tablet Take 1 tablet (10 mg total) by mouth daily. 30 tablet 11  .  fluticasone (FLONASE) 50 MCG/ACT nasal spray Place 2 sprays into both nostrils daily. (Patient taking differently: Place 2 sprays into both nostrils at bedtime. ) 16 g 6  . furosemide (LASIX) 40 MG tablet TAKE 1 TABLET TWICE DAILY AS NEEDED FOR  SWELLING, FLUID (Patient taking differently: Take 40 mg by mouth daily as needed (fluid retention.). ) 180 tablet 0  . gabapentin (NEURONTIN) 300 MG capsule Take 1 to 2 caps twice daily OR 1 cap every morning & 2 caps every evening (Patient taking differently: Take 300-600 mg by mouth See admin instructions. Take 1 capsule (300 mg) by mouth in the morning &  take 2 capsules (600 mg) by mouth at night.) 360 capsule 0  . glucose blood (ACCU-CHEK AVIVA PLUS) test strip Test sugars daily 100 each 3  . lisinopril (ZESTRIL) 10 MG tablet Take 1 tablet (10 mg total) by mouth daily. 90 tablet 1  . meloxicam (MOBIC) 7.5 MG tablet Take 1 tablet (7.5 mg total) by mouth daily as needed for pain. 90 tablet 0  . Multiple Vitamin (MULTIVITAMIN WITH MINERALS) TABS tablet Take 1 tablet by mouth daily.    . nitroGLYCERIN (NITROSTAT) 0.4 MG SL tablet Place 1 tablet (0.4 mg total) under the tongue every 5 (five) minutes as needed for chest pain. (Patient not taking: Reported on 11/24/2018) 20 tablet 3  . nystatin cream (MYCOSTATIN) Apply 1 application topically 2 (two) times daily. 30 g 1  . Omega-3 Fatty Acids (FISH OIL PO) Take 1 capsule by mouth daily.     . pantoprazole (PROTONIX) 40 MG tablet Take 1 tablet (40 mg total) by mouth daily. 90 tablet 1  . potassium chloride SA (K-DUR) 20 MEQ tablet Take 20 mEq by mouth daily as needed (with lasix).    . pramipexole (MIRAPEX) 0.25 MG tablet Take 2 tablets (0.5 mg total) by mouth at bedtime. 180 tablet 1  . pravastatin (PRAVACHOL) 80 MG tablet Take 1 tablet (80 mg total) by mouth daily. 90 tablet 1  . sertraline (ZOLOFT) 100 MG tablet Take 1 tablet (100 mg total) by mouth daily. (Patient taking differently: Take 100 mg by mouth at bedtime. ) 90 tablet 1  . Vitamin D, Cholecalciferol, 25 MCG (1000 UT) TABS Take 25 mg by mouth daily. (Patient taking differently: Take 1,000 Units by mouth daily. ) 90 tablet 0   No current facility-administered medications for this visit.      Past Surgical History:  Procedure Laterality Date  . CHOLECYSTECTOMY    . COLONOSCOPY  10/15/2004   Normal rectum/Normal colon  . COLONOSCOPY N/A 05/10/2016   Procedure: COLONOSCOPY;  Surgeon: Danie Binder, MD;  Location: [facility];  Service: Endoscopy;  Laterality: N/A;  10:30  . ESOPHAGOGASTRODUODENOSCOPY  09/11/2010   Dysphagia likely  multifactorial (possible Candida esophagitis, likely nonspecific esophageal motility disorder, and/or uncontrolled gastroesophageal reflux disease), status post empiric dilation  . ESOPHAGOGASTRODUODENOSCOPY N/A 11/13/2018   Procedure: ESOPHAGOGASTRODUODENOSCOPY (EGD);  Surgeon: Danie Binder, MD;  Location: [facility];  Service: Endoscopy;  Laterality: N/A;  12:30pm  . KIDNEY STONE SURGERY    . SAVORY DILATION N/A 11/13/2018   Procedure: SAVORY DILATION;  Surgeon: Danie Binder, MD;  Location: [facility];  Service: Endoscopy;  Laterality: N/A;     Allergies  Allergen Reactions  . Penicillins Itching and Rash    Has patient had a PCN reaction causing immediate rash, facial/tongue/throat swelling, SOB or lightheadedness with hypotension: no Has patient had a PCN reaction causing severe  rash involving mucus membranes or skin necrosis: no Has patient had a PCN reaction that required hospitalization: no Has patient had a PCN reaction occurring within the last 10 years: no If all of the above answers are "NO", then may proceed with Cephalosporin use.   . Sulfa Antibiotics Rash      Family History  Problem Relation Age of Onset  . Coronary artery disease Father   . COPD Mother   . Asthma Brother   . Coronary artery disease Sister   . Diabetes Sister   . Coronary artery disease Brother   . Coronary artery disease Sister      Social History Ms. Mccartin reports that she quit smoking about 23 years ago. Her smoking use included cigarettes. She started smoking about 53 years ago. She has a 7.50 pack-year smoking history. She has never used smokeless tobacco. Ms. Boehlke reports no history of alcohol use.   Review of Systems CONSTITUTIONAL: No weight loss, fever, chills, weakness or fatigue.  HEENT: Eyes: No visual loss, blurred vision, double vision or yellow sclerae.No hearing loss, sneezing, congestion, runny nose or sore throat.  SKIN: No rash or itching.  CARDIOVASCULAR:  per hpi RESPIRATORY: No shortness of breath, cough or sputum.  GASTROINTESTINAL: No anorexia, nausea, vomiting or diarrhea. No abdominal pain or blood.  GENITOURINARY: No burning on urination, no polyuria NEUROLOGICAL: No headache, dizziness, syncope, paralysis, ataxia, numbness or tingling in the extremities. No change in bowel or bladder control.  MUSCULOSKELETAL: No muscle, back pain, joint pain or stiffness.  LYMPHATICS: No enlarged nodes. No history of splenectomy.  PSYCHIATRIC: No history of depression or anxiety.  ENDOCRINOLOGIC: No reports of sweating, cold or heat intolerance. No polyuria or polydipsia.  Marland Kitchen   Physical Examination Today's Vitals   12/01/18 1422  BP: 121/76  Pulse: (!) 57  SpO2: 94%  Weight: 235 lb (106.6 kg)  Height: 5' 6"  (1.676 m)   Body mass index is 37.93 kg/m.  Gen: resting comfortably, no acute distress HEENT: no scleral icterus, pupils equal round and reactive, no palptable cervical adenopathy,  CV: RRR, no m/r/g no jvd Resp: Clear to auscultation bilaterally GI: abdomen is soft, non-tender, non-distended, normal bowel sounds, no hepatosplenomegaly MSK: extremities are warm, no edema.  Skin: warm, no rash Neuro:  no focal deficits Psych: appropriate affect   Diagnostic Studies 11/2013 Echo Study Conclusions  - Procedure narrative: Transthoracic echocardiography. Image quality was suboptimal. The study was technically difficult, as a result of poor sound wave transmission and body habitus. - Left ventricle: The cavity size was normal. Wall thickness was increased in a pattern of mild LVH. Systolic function was low normal. The estimated ejection fraction was approximately 50%. Images were inadequate for LV wall motion assessment, but no gross regional variation was noted. Features are consistent with a pseudonormal left ventricular filling pattern, with concomitant abnormal relaxation and increased filling pressure (grade 2 diastolic  dysfunction). Doppler parameters are consistent with both elevated ventricular end-diastolic filling pressure and elevated left atrial filling pressure. - Mitral valve: Mildly calcified annulus. Mildly thickened leaflets . There was mild regurgitation. - Left atrium: The atrium was moderately to severely dilated.  11/2015 Nuclear stress  There was no ST segment deviation noted during stress.  No T wave inversion was noted during stress.  Findings consistent with prior myocardial infarction.  This is a low risk study.  The left ventricular ejection fraction is mildly decreased (45-54%).  Small inferolateral wall infarct at mid and basal level EF estimated  50% but looks normal no ischemia    Assessment and Plan  1. Chronic diastolic heart failure - doing well, continue prn lasix  2. HTN -bp at goal, continue current meds  3. Bradycardia - chronic sinus bradycardia that is asymptomatic - we will continue to monitor at this time  4. Hyperlipidemia - she will continue statin  5. Chest pain - long history of chest pain. Most recently admit 11/2015 with chest pain, negative nuclear stress test - denies any recent symptoms, continue to monitor.   F/u 1 year    Arnoldo Lenis, M.D.

## 2018-12-07 ENCOUNTER — Other Ambulatory Visit: Payer: Self-pay | Admitting: Family Medicine

## 2018-12-07 DIAGNOSIS — B372 Candidiasis of skin and nail: Secondary | ICD-10-CM

## 2018-12-08 ENCOUNTER — Other Ambulatory Visit: Payer: Self-pay | Admitting: *Deleted

## 2018-12-08 DIAGNOSIS — G629 Polyneuropathy, unspecified: Secondary | ICD-10-CM

## 2018-12-08 MED ORDER — GABAPENTIN 300 MG PO CAPS: ORAL_CAPSULE | ORAL | 0 refills | Status: DC

## 2019-01-05 LAB — HM DIABETES EYE EXAM

## 2019-02-22 ENCOUNTER — Ambulatory Visit (INDEPENDENT_AMBULATORY_CARE_PROVIDER_SITE_OTHER): Payer: Medicare Other | Admitting: *Deleted

## 2019-02-22 ENCOUNTER — Other Ambulatory Visit: Payer: Self-pay

## 2019-02-22 VITALS — BP 112/73 | Ht 66.0 in | Wt 235.0 lb

## 2019-02-22 DIAGNOSIS — Z0000 Encounter for general adult medical examination without abnormal findings: Secondary | ICD-10-CM | POA: Diagnosis not present

## 2019-02-22 NOTE — Progress Notes (Addendum)
MEDICARE ANNUAL WELLNESS VISIT  02/22/2019  Telephone Visit Disclaimer This Medicare AWV was conducted by telephone due to national recommendations for restrictions regarding the COVID-19 Pandemic (e.g. social distancing).  I verified, using two identifiers, that I am speaking with Allison Rollins or their authorized healthcare agent. I discussed the limitations, risks, security, and privacy concerns of performing an evaluation and management service by telephone and the potential availability of an in-person appointment in the future. The patient expressed understanding and agreed to proceed.   Subjective:  Allison Rollins is a 68 y.o. female patient of Rakes, Connye Burkitt, FNP who had a Medicare Annual Wellness Visit today via telephone. Gracy is Retired and lives alone. she has 2 children. she reports that she is socially active and does interact with friends/family regularly. she is minimally physically active and enjoys gardening.  Patient Care Team: Baruch Gouty, FNP as PCP - General (Family Medicine) Harl Bowie, Alphonse Guild, MD as PCP - Cardiology (Cardiology) Danie Binder, MD (Gastroenterology) Phillips Odor, MD as Consulting Physician (Neurology) Arnoldo Lenis, MD as Consulting Physician (Cardiology)  Advanced Directives 02/22/2019 11/13/2018 02/20/2018 12/25/2017 03/31/2017 09/30/2016 05/10/2016  Does Patient Have a Medical Advance Directive? Yes No No No No No No  Type of Advance Directive Living will;Healthcare Power of Attorney - - - - - -  Does patient want to make changes to medical advance directive? No - Patient declined - - - - - -  Copy of [place] in Chart? No - copy requested - - - - - -  Would patient like information on creating a medical advance directive? - - Yes (MAU/Ambulatory/Procedural Areas - Information given) - - No - Patient declined No - Patient declined  Pre-existing out of facility DNR order (yellow form or pink MOST form) - - - - - - -    Hospital Utilization Over the Past 12 Months: # of hospitalizations or ER visits: 0 # of surgeries: 0  Review of Systems    Patient reports that her overall health is unchanged compared to last year.  General ROS: negative  Patient Reported Readings (BP, Pulse, CBG, Weight, etc) BP 112/73   Ht 5' 6"  (1.676 m)   Wt 235 lb (106.6 kg)   BMI 37.93 kg/m    Pain Assessment       Current Medications & Allergies (verified) Allergies as of 02/22/2019      Reactions   Penicillins Itching, Rash   Has patient had a PCN reaction causing immediate rash, facial/tongue/throat swelling, SOB or lightheadedness with hypotension: no Has patient had a PCN reaction causing severe rash involving mucus membranes or skin necrosis: no Has patient had a PCN reaction that required hospitalization: no Has patient had a PCN reaction occurring within the last 10 years: no If all of the above answers are "NO", then may proceed with Cephalosporin use.   Sulfa Antibiotics Rash      Medication List       Accurate as of February 22, 2019  3:41 PM. If you have any questions, ask your nurse or doctor.        STOP taking these medications   pravastatin 80 MG tablet Commonly known as: PRAVACHOL     TAKE these medications   Accu-Chek Aviva Plus w/Device Kit Test sugars daily   acetaminophen 500 MG tablet Commonly known as: TYLENOL Take 500-1,000 mg by mouth every 6 (six) hours as needed for moderate pain (pain.).  albuterol (2.5 MG/3ML) 0.083% nebulizer solution Commonly known as: PROVENTIL Take 3 mLs (2.5 mg total) by nebulization every 6 (six) hours as needed for wheezing or shortness of breath.   albuterol 108 (90 Base) MCG/ACT inhaler Commonly known as: VENTOLIN HFA Inhale 2 puffs into the lungs every 6 (six) hours as needed for wheezing.   aspirin EC 81 MG tablet Take 1 tablet (81 mg total) by mouth daily.   B-D SINGLE USE SWABS REGULAR Pads Test sugars daily   blood glucose meter  kit and supplies Accu-check Nano smartview meter   cetirizine 10 MG tablet Commonly known as: ZYRTEC Take 1 tablet (10 mg total) by mouth daily.   cholecalciferol 25 MCG (1000 UT) tablet Commonly known as: VITAMIN D3 Take 1,000 Units by mouth 2 (two) times daily.   FISH OIL PO Take 1 capsule by mouth daily.   fluticasone 50 MCG/ACT nasal spray Commonly known as: FLONASE Place 2 sprays into both nostrils daily. What changed: when to take this   furosemide 40 MG tablet Commonly known as: LASIX TAKE 1 TABLET TWICE DAILY AS NEEDED FOR  SWELLING, FLUID What changed: See the new instructions.   gabapentin 300 MG capsule Commonly known as: NEURONTIN 1 cap every morning & 2 caps every evening   glucose blood test strip Commonly known as: Accu-Chek Aviva Plus Test sugars daily   lisinopril 10 MG tablet Commonly known as: ZESTRIL Take 1 tablet (10 mg total) by mouth daily.   meloxicam 7.5 MG tablet Commonly known as: MOBIC Take 1 tablet (7.5 mg total) by mouth daily as needed for pain.   multivitamin with minerals Tabs tablet Take 1 tablet by mouth daily.   nitroGLYCERIN 0.4 MG SL tablet Commonly known as: NITROSTAT Place 1 tablet (0.4 mg total) under the tongue every 5 (five) minutes as needed for chest pain.   nystatin cream Commonly known as: MYCOSTATIN Apply 1 application topically twice daily   pantoprazole 40 MG tablet Commonly known as: PROTONIX Take 1 tablet (40 mg total) by mouth daily.   potassium chloride SA 20 MEQ tablet Commonly known as: KLOR-CON Take 20 mEq by mouth daily as needed (with lasix).   pramipexole 0.25 MG tablet Commonly known as: MIRAPEX Take 2 tablets (0.5 mg total) by mouth at bedtime.   sertraline 100 MG tablet Commonly known as: ZOLOFT Take 1 tablet (100 mg total) by mouth daily. What changed: when to take this       History (reviewed): Past Medical History:  Diagnosis Date  . Anxiety   . Asthma   . Chest pain 12/22/2015   . CHF (congestive heart failure) ([place])   . COPD (chronic obstructive pulmonary disease) ([place])   . Depression   . Diabetes mellitus without complication ([place])   . Headache   . HTN (hypertension)   . Hypoglycemia   . Hypokalemia 07/28/2013  . Kidney stones   . Parkinson's disease   . Pneumonia   . Pre-diabetes   . S/P colonoscopy 10/15/04   normal   Past Surgical History:  Procedure Laterality Date  . CHOLECYSTECTOMY    . COLONOSCOPY  10/15/2004   Normal rectum/Normal colon  . COLONOSCOPY N/A 05/10/2016   Procedure: COLONOSCOPY;  Surgeon: Danie Binder, MD;  [facility];  Service: Endoscopy;  Laterality: N/A;  10:30  . ESOPHAGOGASTRODUODENOSCOPY  09/11/2010   Dysphagia likely multifactorial (possible Candida esophagitis, likely nonspecific esophageal motility disorder, and/or uncontrolled gastroesophageal reflux disease), status post empiric dilation  . ESOPHAGOGASTRODUODENOSCOPY  N/A 11/13/2018   Procedure: ESOPHAGOGASTRODUODENOSCOPY (EGD);  Surgeon: Danie Binder, MD;  [facility]: [facility];  Service: Endoscopy;  Laterality: N/A;  12:30pm  . KIDNEY STONE SURGERY    . SAVORY DILATION N/A 11/13/2018   Procedure: SAVORY DILATION;  Surgeon: Danie Binder, MD;  [facility]: [facility];  Service: Endoscopy;  Laterality: N/A;   Family History  Problem Relation Age of Onset  . Coronary artery disease Father   . COPD Mother   . Asthma Brother   . Coronary artery disease Sister   . Diabetes Sister   . Coronary artery disease Brother   . Coronary artery disease Sister    Social History   Socioeconomic History  . Marital status: Widowed    Spouse name: Not on file  . Number of children: 2  . Years of education: Not on file  . Highest education level: Not on file  Occupational History  . Occupation: disabled  Social Needs  . Financial resource strain: Not on file  . Food insecurity    Worry: Not on file    Inability: Not on file  . Transportation needs     Medical: Not on file    Non-medical: Not on file  Tobacco Use  . Smoking status: Former Smoker    Packs/day: 0.25    Years: 30.00    Pack years: 7.50    Types: Cigarettes    Start date: 01/04/1965    Quit date: 01/03/1995    Years since quitting: 24.1  . Smokeless tobacco: Never Used  Substance and Sexual Activity  . Alcohol use: No    Alcohol/week: 0.0 standard drinks  . Drug use: No  . Sexual activity: Not on file  Lifestyle  . Physical activity    Days per week: Not on file    Minutes per session: Not on file  . Stress: Not on file  Relationships  . Social Herbalist on phone: Not on file    Gets together: Not on file    Attends religious service: Not on file    Active member of club or organization: Not on file    Attends meetings of clubs or organizations: Not on file    Relationship status: Not on file  Other Topics Concern  . Not on file  Social History Narrative  . Not on file    Activities of Daily Living In your present state of health, do you have any difficulty performing the following activities: 02/22/2019  Hearing? N  Vision? Y  Comment readers and RX  Difficulty concentrating or making decisions? N  Walking or climbing stairs? Y  Dressing or bathing? N  Doing errands, shopping? N  Preparing Food and eating ? N  Using the Toilet? N  In the past six months, have you accidently leaked urine? N  Do you have problems with loss of bowel control? N  Managing your Medications? N  Managing your Finances? N  Housekeeping or managing your Housekeeping? N  Some recent data might be hidden    Patient Education/ Literacy    Exercise Current Exercise Habits: Home exercise routine, Type of exercise: stretching;walking, Time (Minutes): 20, Frequency (Times/Week): 7, Weekly Exercise (Minutes/Week): 140, Intensity: Mild, Exercise limited by: None identified  Diet Patient reports consuming 2 meals a day and 1 snack(s) a day Patient reports that her  primary diet is: Regular Patient reports that she does have regular access to food.   Depression Screen [facility] 2/9  Scores 02/22/2019 11/24/2018 11/24/2018 05/08/2018 02/20/2018 02/03/2018 01/12/2018  PHQ - 2 Score 0 0 0 0 1 1 1   PHQ- 9 Score - 0 - - - 6 -  Exception Documentation - - - - - - -  Not completed - - - - - - -     Fall Risk Fall Risk  02/22/2019 11/24/2018 02/20/2018 02/03/2018 01/12/2018  Falls in the past year? 1 1 No No No  Comment - - - - -  Number falls in past yr: 0 0 - - -  Comment - - - - -  Injury with Fall? 0 0 - - -  Risk for fall due to : - - - - -  Follow up Falls prevention discussed - - - -     Objective:  Juanita Craver Huether seemed alert and oriented and she participated appropriately during our telephone visit.  Blood Pressure Weight BMI  BP Readings from Last 3 Encounters:  02/22/19 112/73  12/01/18 121/76  11/24/18 112/73   Wt Readings from Last 3 Encounters:  02/22/19 235 lb (106.6 kg)  12/01/18 235 lb (106.6 kg)  11/24/18 233 lb (105.7 kg)   BMI Readings from Last 1 Encounters:  02/22/19 37.93 kg/m    *Unable to obtain current vital signs, weight, and BMI due to telephone visit type  Hearing/Vision  . Baker Janus did not seem to have difficulty with hearing/understanding during the telephone conversation . Reports that she has had a formal eye exam by an eye care professional within the past year . Reports that she has not had a formal hearing evaluation within the past year *Unable to fully assess hearing and vision during telephone visit type  Cognitive Function: 6CIT Screen 02/22/2019  What Year? 4 points  What month? 0 points  What time? 0 points  Count back from 20 0 points  Months in reverse 2 points  Repeat phrase 0 points  Total Score 6   (Normal:0-7, Significant for Dysfunction: >8)  Normal Cognitive Function Screening: Yes   Immunization & Health Maintenance Record Immunization History  Administered Date(s) Administered  .  Influenza, High Dose Seasonal PF 02/02/2015, 02/09/2016, 01/13/2017, 01/07/2018  . Influenza-Unspecified 12/18/2018  . Pneumococcal Conjugate-13 01/05/2015  . Pneumococcal Polysaccharide-23 02/09/2016  . Zoster 01/05/2015    Health Maintenance  Topic Date Due  . MAMMOGRAM  12/16/2017  . TETANUS/TDAP  11/24/2019 (Originally 01/04/1970)  . HEMOGLOBIN A1C  05/27/2019  . FOOT EXAM  11/24/2019  . OPHTHALMOLOGY EXAM  01/05/2020  . COLONOSCOPY  05/10/2026  . INFLUENZA VACCINE  Completed  . DEXA SCAN  Completed  . Hepatitis C Screening  Completed  . PNA vac Low Risk Adult  Completed       Assessment  This is a routine wellness examination for [employer].  Health Maintenance: Due or Overdue Health Maintenance Due  Topic Date Due  . MAMMOGRAM  12/16/2017    Allison Rollins does not need a referral for Community Assistance: Care Management:   no Social Work:    no Prescription Assistance:  no Nutrition/Diabetes Education:  no   Plan:  Personalized Goals Goals Addressed            This Visit's Progress   . Prevent falls        Personalized Health Maintenance & Screening Recommendations  Td vaccine  Lung Cancer Screening Recommended: no (Low Dose CT Chest recommended if Age 58-80 years, 30 pack-year currently smoking OR have quit w/in past 15 years)  Hepatitis C Screening recommended: no HIV Screening recommended: no  Advanced Directives: Written information was not prepared per patient's request.  Referrals & Orders No orders of the defined types were placed in this encounter.   Follow-up Plan . Follow-up with Baruch Gouty, FNP as planned    I have personally reviewed and noted the following in the patient's chart:   . Medical and social history . Use of alcohol, tobacco or illicit drugs  . Current medications and supplements . Functional ability and status . Nutritional status . Physical activity . Advanced directives . List of other physicians .  Hospitalizations, surgeries, and ER visits in previous 12 months . Vitals . Screenings to include cognitive, depression, and falls . Referrals and appointments  In addition, I have reviewed and discussed with Allison Rollins certain preventive protocols, quality metrics, and best practice recommendations. A written personalized care plan for preventive services as well as general preventive health recommendations is available and can be mailed to the patient at her request.      Tiane Szydlowski, Cameron Proud  02/22/2019  I have reviewed and agree with the above AWV documentation.   Mary-Margaret Hassell Done, FNP

## 2019-02-22 NOTE — Patient Instructions (Signed)
  Allison Rollins , Thank you for taking time to come for your Medicare Wellness Visit. I appreciate your ongoing commitment to your health goals. Please review the following plan we discussed and let me know if I can assist you in the future.   These are the goals we discussed: Goals    . Exercise 3x per week (30 min per time)     Exercise at the [place] [employer] 3 times per week for 30 minutes each session.    . Prevent falls       This is a list of the screening recommended for you and due dates:  Health Maintenance  Topic Date Due  . Mammogram  12/16/2017  . Tetanus Vaccine  11/24/2019*  . Hemoglobin A1C  05/27/2019  . Complete foot exam   11/24/2019  . Eye exam for diabetics  01/05/2020  . Colon Cancer Screening  05/10/2026  . Flu Shot  Completed  . DEXA scan (bone density measurement)  Completed  .  Hepatitis C: One time screening is recommended by Center for Disease Control  (CDC) for  adults born from [birth year] through 1966.   Completed  . Pneumonia vaccines  Completed  *Topic was postponed. The date shown is not the original due date.

## 2019-02-26 ENCOUNTER — Encounter: Payer: Self-pay | Admitting: Family Medicine

## 2019-02-26 ENCOUNTER — Ambulatory Visit (INDEPENDENT_AMBULATORY_CARE_PROVIDER_SITE_OTHER): Payer: Medicare Other | Admitting: Family Medicine

## 2019-02-26 ENCOUNTER — Other Ambulatory Visit: Payer: Self-pay

## 2019-02-26 VITALS — BP 115/74 | HR 73 | Temp 97.6°F | Resp 22 | Ht 66.0 in | Wt 236.0 lb

## 2019-02-26 DIAGNOSIS — E1159 Type 2 diabetes mellitus with other circulatory complications: Secondary | ICD-10-CM

## 2019-02-26 DIAGNOSIS — E785 Hyperlipidemia, unspecified: Secondary | ICD-10-CM

## 2019-02-26 DIAGNOSIS — K219 Gastro-esophageal reflux disease without esophagitis: Secondary | ICD-10-CM | POA: Diagnosis not present

## 2019-02-26 DIAGNOSIS — E875 Hyperkalemia: Secondary | ICD-10-CM

## 2019-02-26 DIAGNOSIS — E1169 Type 2 diabetes mellitus with other specified complication: Secondary | ICD-10-CM | POA: Diagnosis not present

## 2019-02-26 DIAGNOSIS — I152 Hypertension secondary to endocrine disorders: Secondary | ICD-10-CM

## 2019-02-26 DIAGNOSIS — R1110 Vomiting, unspecified: Secondary | ICD-10-CM

## 2019-02-26 DIAGNOSIS — N289 Disorder of kidney and ureter, unspecified: Secondary | ICD-10-CM

## 2019-02-26 DIAGNOSIS — L819 Disorder of pigmentation, unspecified: Secondary | ICD-10-CM

## 2019-02-26 DIAGNOSIS — E1142 Type 2 diabetes mellitus with diabetic polyneuropathy: Secondary | ICD-10-CM

## 2019-02-26 DIAGNOSIS — I10 Essential (primary) hypertension: Secondary | ICD-10-CM

## 2019-02-26 LAB — BAYER DCA HB A1C WAIVED: HB A1C (BAYER DCA - WAIVED): 5.9 % (ref <7.0)

## 2019-02-26 NOTE — Progress Notes (Signed)
Subjective:  Patient ID: Allison Rollins, female    DOB: [birth year], 68 y.o.   MRN: [id number]  Patient Care Team: Baruch Gouty, FNP as PCP - General (Family Medicine) Harl Bowie, Alphonse Guild, MD as PCP - Cardiology (Cardiology) Danie Binder, MD (Gastroenterology) Phillips Odor, MD as Consulting Physician (Neurology) Harl Bowie, Alphonse Guild, MD as Consulting Physician (Cardiology)   Chief Complaint:  Medical Management of Chronic Issues (3 mo ), Hyperlipidemia, Diabetes, and Hypertension   HPI: Allison Rollins is a 68 y.o. female presenting on 02/26/2019 for Medical Management of Chronic Issues (3 mo ), Hyperlipidemia, Diabetes, and Hypertension   1. Type 2 DM Pt presents for follow up evaluation of Type 2 diabetes mellitus.  Current symptoms include none. Patient denies foot ulcerations, nausea, paresthesia of the feet, visual disturbances and weight loss.  Current diabetic medications include: none, diet controlled  Current monitoring regimen: home blood tests - daily Home blood sugar records: fasting range: can't remember, "not high" Any episodes of hypoglycemia? no  Known diabetic complications: peripheral neuropathy Cardiovascular risk factors: advanced age (older than 3 for men, 29 for women), diabetes mellitus, dyslipidemia, hypertension, obesity (BMI >= 30 kg/m2) and sedentary lifestyle Eye exam current (within one year): yes Podiatry yearly?  No Weight trend: stable Current diet: diabetic, low fat/ cholesterol, low salt Current exercise: none  PNA Vaccine UTD?  Yes Hep B Vaccine?  Yes Tdap Vaccine UTD?  Yes Urine microalbumin UTD? No  Is She on ACE inhibitor or angiotensin II receptor blocker?  Yes, lisinopril Is She on statin? No  Is She on ASA 81 mg daily?  Yes  2. Hypertension Complaint with meds - Yes Current Medications - lisinopril  Checking BP at home: sometime, can't remember but reports "not high" Exercising Regularly - No Watching Salt intake - Yes  Pertinent ROS:  Headache - No Fatigue - No Visual Disturbances - No Chest pain - No Dyspnea - No Palpitations - No LE edema - Yes They report good compliance with medications and can restate their regimen by memory. No medication side effects.  3. Hyperlipidemia She reports trying to watch her diet. She does not exercise.   4. Regurgitation of food Reports having esophagus stretched in July for an esophageal stricture. She reports have to have 3 areas of fatty tissue clipped during the procedure. Since then she has episodes of food regurgitation after she eats. The happens every time she eats. She reports feeling a burning sensation on the right side of her throat that is followed by her regurgitation and thick, white substance. She reports that she does vomit this up and that it is a small amount. She denies nausea, abdominal pain, or dysphagia. Denies cough or dyspnea. Denies blood in stool, constipation, or diarrhea. She has not lost weight. She has not let GI know about these symptoms.   5. GERD She reports that her symptoms are controlled with Protonix daily.   6. Skin lesion Reports a "spot" on the back of her neck that has been there for 3 years. She reports that it is bothersome and itches. She has tried neosporin, alcohol swabs, and hydrogen peroxide without relief. She denies any significant changes in size. Denies drainage.   Family, social, and smoking history reviewed.   BP Readings from Last 3 Encounters:  02/26/19 115/74  02/22/19 112/73  12/01/18 121/76   CMP Latest Ref Rng & Units 11/24/2018 05/08/2018 02/03/2018  Glucose 65 - 99 mg/dL 85 83 132(H)  BUN 8 - 27 mg/dL 15 14 10   Creatinine 0.57 - 1.00 mg/dL 0.84 0.83 0.87  Sodium 134 - 144 mmol/L 146(H) 142 143  Potassium 3.5 - 5.2 mmol/L 4.7 4.6 4.2  Chloride 96 - 106 mmol/L 105 101 102  CO2 20 - 29 mmol/L 23 24 23   Calcium 8.7 - 10.3 mg/dL 9.6 9.7 9.3  Total Protein 6.0 - 8.5 g/dL 6.7 - -  Total Bilirubin 0.0 -  1.2 mg/dL 0.4 - -  Alkaline Phos 39 - 117 IU/L 101 - -  AST 0 - 40 IU/L 22 - -  ALT 0 - 32 IU/L 16 - -      Relevant past medical, surgical, family, and social history reviewed and updated as indicated.  Allergies and medications reviewed and updated. Date reviewed: Chart in Epic.   Past Medical History:  Diagnosis Date  . Anxiety   . Asthma   . Chest pain 12/22/2015  . CHF (congestive heart failure) ([place])   . COPD (chronic obstructive pulmonary disease) ([place])   . Depression   . Diabetes mellitus without complication ([place])   . Headache   . HTN (hypertension)   . Hypoglycemia   . Hypokalemia 07/28/2013  . Kidney stones   . Parkinson's disease   . Pneumonia   . Pre-diabetes   . S/P colonoscopy 10/15/04   normal    Past Surgical History:  Procedure Laterality Date  . CHOLECYSTECTOMY    . COLONOSCOPY  10/15/2004   Normal rectum/Normal colon  . COLONOSCOPY N/A 05/10/2016   Procedure: COLONOSCOPY;  Surgeon: Danie Binder, MD;  Location: [facility];  Service: Endoscopy;  Laterality: N/A;  10:30  . ESOPHAGOGASTRODUODENOSCOPY  09/11/2010   Dysphagia likely multifactorial (possible Candida esophagitis, likely nonspecific esophageal motility disorder, and/or uncontrolled gastroesophageal reflux disease), status post empiric dilation  . ESOPHAGOGASTRODUODENOSCOPY N/A 11/13/2018   Procedure: ESOPHAGOGASTRODUODENOSCOPY (EGD);  Surgeon: Danie Binder, MD;  Location: [facility];  Service: Endoscopy;  Laterality: N/A;  12:30pm  . KIDNEY STONE SURGERY    . SAVORY DILATION N/A 11/13/2018   Procedure: SAVORY DILATION;  Surgeon: Danie Binder, MD;  Location: [facility];  Service: Endoscopy;  Laterality: N/A;    Social History   Socioeconomic History  . Marital status: Widowed    Spouse name: Not on file  . Number of children: 2  . Years of education: Not on file  . Highest education level: Not on file  Occupational History  . Occupation: disabled  Social Needs  .  Financial resource strain: Not on file  . Food insecurity    Worry: Not on file    Inability: Not on file  . Transportation needs    Medical: Not on file    Non-medical: Not on file  Tobacco Use  . Smoking status: Former Smoker    Packs/day: 0.25    Years: 30.00    Pack years: 7.50    Types: Cigarettes    Start date: 01/04/1965    Quit date: 01/03/1995    Years since quitting: 24.1  . Smokeless tobacco: Never Used  Substance and Sexual Activity  . Alcohol use: No    Alcohol/week: 0.0 standard drinks  . Drug use: No  . Sexual activity: Not on file  Lifestyle  . Physical activity    Days per week: Not on file    Minutes per session: Not on file  . Stress: Not on file  Relationships  . Social connections  Talks on phone: Not on file    Gets together: Not on file    Attends religious service: Not on file    Active member of club or organization: Not on file    Attends meetings of clubs or organizations: Not on file    Relationship status: Not on file  . Intimate partner violence    Fear of current or ex partner: Not on file    Emotionally abused: Not on file    Physically abused: Not on file    Forced sexual activity: Not on file  Other Topics Concern  . Not on file  Social History Narrative  . Not on file    Outpatient Encounter Medications as of 02/26/2019  Medication Sig  . acetaminophen (TYLENOL) 500 MG tablet Take 500-1,000 mg by mouth every 6 (six) hours as needed for moderate pain (pain.).   Marland Kitchen albuterol (PROVENTIL HFA;VENTOLIN HFA) 108 (90 Base) MCG/ACT inhaler Inhale 2 puffs into the lungs every 6 (six) hours as needed for wheezing.  Marland Kitchen albuterol (PROVENTIL) (2.5 MG/3ML) 0.083% nebulizer solution Take 3 mLs (2.5 mg total) by nebulization every 6 (six) hours as needed for wheezing or shortness of breath.  . Alcohol Swabs (B-D SINGLE USE SWABS REGULAR) PADS Test sugars daily  . aspirin EC 81 MG tablet Take 1 tablet (81 mg total) by mouth daily.  . blood glucose  meter kit and supplies Accu-check Nano smartview meter  . Blood Glucose Monitoring Suppl (ACCU-CHEK AVIVA PLUS) w/Device KIT Test sugars daily  . cetirizine (ZYRTEC) 10 MG tablet Take 1 tablet (10 mg total) by mouth daily.  . cholecalciferol (VITAMIN D3) 25 MCG (1000 UT) tablet Take 1,000 Units by mouth 2 (two) times daily.   . fluticasone (FLONASE) 50 MCG/ACT nasal spray Place 2 sprays into both nostrils daily. (Patient taking differently: Place 2 sprays into both nostrils at bedtime. )  . furosemide (LASIX) 40 MG tablet TAKE 1 TABLET TWICE DAILY AS NEEDED FOR  SWELLING, FLUID (Patient taking differently: Take 40 mg by mouth daily as needed (fluid retention.). )  . gabapentin (NEURONTIN) 300 MG capsule 1 cap every morning & 2 caps every evening  . glucose blood (ACCU-CHEK AVIVA PLUS) test strip Test sugars daily  . lisinopril (ZESTRIL) 10 MG tablet Take 1 tablet (10 mg total) by mouth daily.  . meloxicam (MOBIC) 7.5 MG tablet Take 1 tablet (7.5 mg total) by mouth daily as needed for pain.  . Multiple Vitamin (MULTIVITAMIN WITH MINERALS) TABS tablet Take 1 tablet by mouth daily.  . nitroGLYCERIN (NITROSTAT) 0.4 MG SL tablet Place 1 tablet (0.4 mg total) under the tongue every 5 (five) minutes as needed for chest pain.  Marland Kitchen nystatin cream (MYCOSTATIN) Apply 1 application topically twice daily  . Omega-3 Fatty Acids (FISH OIL PO) Take 1 capsule by mouth daily.   . pantoprazole (PROTONIX) 40 MG tablet Take 1 tablet (40 mg total) by mouth daily.  . potassium chloride SA (K-DUR) 20 MEQ tablet Take 20 mEq by mouth daily as needed (with lasix).  Marland Kitchen sertraline (ZOLOFT) 100 MG tablet Take 1 tablet (100 mg total) by mouth daily. (Patient taking differently: Take 100 mg by mouth at bedtime. )  . pramipexole (MIRAPEX) 0.25 MG tablet Take 2 tablets (0.5 mg total) by mouth at bedtime.   No facility-administered encounter medications on file as of 02/26/2019.     Allergies  Allergen Reactions  . Penicillins  Itching and Rash    Has patient had a PCN reaction  causing immediate rash, facial/tongue/throat swelling, SOB or lightheadedness with hypotension: no Has patient had a PCN reaction causing severe rash involving mucus membranes or skin necrosis: no Has patient had a PCN reaction that required hospitalization: no Has patient had a PCN reaction occurring within the last 10 years: no If all of the above answers are "NO", then may proceed with Cephalosporin use.   . Sulfa Antibiotics Rash    Review of Systems  Constitutional: Negative for appetite change, chills, diaphoresis, fatigue, fever and unexpected weight change.  HENT: Negative for sore throat and trouble swallowing.   Eyes: Negative for visual disturbance.  Respiratory: Negative for cough and chest tightness.   Cardiovascular: Positive for leg swelling (baseline). Negative for palpitations.  Gastrointestinal: Negative for abdominal pain, blood in stool, constipation, diarrhea and nausea.  Genitourinary: Negative for decreased urine volume and difficulty urinating.  Neurological: Negative for dizziness, facial asymmetry, speech difficulty, light-headedness and numbness.  Psychiatric/Behavioral: Negative for confusion and decreased concentration.        Objective:  BP 115/74   Pulse 73   Temp 97.6 F (36.4 C)   Resp (!) 22   Ht 5' 6"  (1.676 m)   Wt 236 lb (107 kg)   SpO2 94%   BMI 38.09 kg/m    Wt Readings from Last 3 Encounters:  02/26/19 236 lb (107 kg)  02/22/19 235 lb (106.6 kg)  12/01/18 235 lb (106.6 kg)    Physical Exam Constitutional:      General: She is not in acute distress.    Appearance: She is obese. She is not ill-appearing, toxic-appearing or diaphoretic.  HENT:     Nose: Nose normal.     Mouth/Throat:     Lips: Pink. No lesions.     Mouth: Mucous membranes are moist.     Tongue: No lesions. Tongue does not deviate from midline.     Palate: No mass.     Pharynx: Oropharynx is clear. No  oropharyngeal exudate or posterior oropharyngeal erythema.     Tonsils: No tonsillar exudate or tonsillar abscesses. 1+ on the right. 1+ on the left.  Eyes:     Extraocular Movements: Extraocular movements intact.     Conjunctiva/sclera: Conjunctivae normal.     Pupils: Pupils are equal, round, and reactive to light.  Neck:     Musculoskeletal: Normal range of motion and neck supple.     Thyroid: No thyromegaly.     Vascular: No carotid bruit.     Trachea: Trachea normal.   Cardiovascular:     Rate and Rhythm: Normal rate and regular rhythm.     Heart sounds: Normal heart sounds. No murmur.  Pulmonary:     Effort: Pulmonary effort is normal. No respiratory distress.     Breath sounds: Normal breath sounds.  Abdominal:     General: Bowel sounds are normal. There is no distension.     Palpations: Abdomen is soft.     Tenderness: There is no abdominal tenderness.  Musculoskeletal:     Right lower leg: Edema (1+) present.     Left lower leg: Edema (1+) present.  Lymphadenopathy:     Cervical: No cervical adenopathy.  Skin:    General: Skin is warm and dry.     Capillary Refill: Capillary refill takes 2 to 3 seconds.     Findings: Lesion present.     Comments: Hyperpigmented, raised lesion with ulcerated areas. See picture below   Neurological:     General: No focal deficit  present.     Mental Status: She is alert and oriented to person, place, and time. Mental status is at baseline.  Psychiatric:        Mood and Affect: Mood normal.        Behavior: Behavior normal.        Thought Content: Thought content normal.        Judgment: Judgment normal.       Results for orders placed or performed in visit on 01/18/19  HM DIABETES EYE EXAM  Result Value Ref Range   HM Diabetic Eye Exam No Retinopathy No Retinopathy       Pertinent labs & imaging results that were available during my care of the patient were reviewed by me and considered in my medical decision making.   Assessment & Plan:  Allison Rollins was seen today for medical management of chronic issues, hyperlipidemia, diabetes and hypertension.  Diagnoses and all orders for this visit:  Type 2 diabetes mellitus with diabetic polyneuropathy, without long-term current use of insulin (HCC) Lab Results  Component Value Date   HGBA1C 5.9 02/26/2019   HGBA1C 6.0 11/24/2018   HGBA1C 5.7 05/08/2018   A1C 5.9 today in office, reviewed and discussed with patient.  Diabetes Control: well controlled, diet controlled Instruction/counseling given: reminded to get eye exam, reminded to bring blood glucose meter & log to each visit, discussed foot care, discussed the need for weight loss, discussed diet and provided printed educational material   1.  Rx changes: none 2.  Education: Reviewed 'ABCs' of diabetes management (respective goals in parentheses):  A1C (<7), blood pressure (<130/80), BMI (<25), and cholesterol (LDL <100). 3.  Discussed pathophysiology of DM; difference between type 1 and type 2 DM. 4.  CHO counting diet discussed.  Reviewed CHO amount in various foods and how to read nutrition labels.  Discussed recommended serving sizes.  5.  Recommend check BG several times a week and record readings to bring to next appointment. Report persistent high or low readings. 6.  Recommended increase physical activity - goal is 150 minutes per week and advance as tolerated. 7.  Adequate sleep, at least 6-8 hours per night.  8.  Smoking cessation.  9.  Follow up: 3 months   -     CBC with Differential/Platelet -     Bayer DCA Hb A1c Waived  Hyperlipidemia associated with type 2 diabetes mellitus ([place]) Diet encouraged - increase intake of fresh fruits and vegetables, increase intake of lean proteins. Bake, broil, or grill foods. Avoid fried, greasy, and fatty foods. Avoid fast foods. Increase intake of fiber-rich whole grains. Exercise encouraged - at least 150 minutes per week and advance as tolerated.  Goal BMI < 25.  Continue medications as prescribed. Follow up in 3-6 months as discussed.  -     CBC with Differential/Platelet -     Lipid panel  Hypertension associated with diabetes ([place]) BP well controlled. Changes were not made in regimen today. Goal BP is 130/80. Pt aware to report any persistent high or low readings. DASH diet and exercise encouraged. Stress management encouraged. Avoid nicotine and tobacco product use. Avoid excessive alcohol and NSAID's. Avoid more than 2000 mg of sodium daily. Medications as prescribed.  -     CBC with Differential/Platelet -     CMP14+EGFR  Gastroesophageal reflux disease without esophagitis Labs as below. Continue protonix daily. Report new or worsening symptoms.  -     CBC with Differential/Platelet  Regurgitation of food Follow  up with GI for further evaluation. If unable to get an appointment with GI soon, notify and will order imaging for further evaluation.   Pigmented skin lesion suspicious for malignant neoplasm Concern for malignancy given length of time lesion has been present without improvement. Referral to dermatology for further evaluation.   Continue all other maintenance medications.  Follow up plan: Return in about 3 months (around 05/29/2019), or if symptoms worsen or fail to improve, for DM.  Continue healthy lifestyle choices, including diet (rich in fruits, vegetables, and lean proteins, and low in salt and simple carbohydrates) and exercise (at least 30 minutes of moderate physical activity daily).  Educational handout given for dash diet.  The above assessment and management plan was discussed with the patient. The patient verbalized understanding of and has agreed to the management plan. Patient is aware to call the clinic if they develop any new symptoms or if symptoms persist or worsen. Patient is aware when to return to the clinic for a follow-up visit. Patient educated on when it is appropriate to go to the emergency department.    Marjorie Smolder, RN, FNP student [facility] [phone]  I personally was present during the history, physical exam, and medical decision-making activities of this service and have verified that the service and findings are accurately documented in the nurse practitioner student's note.  Monia Pouch, FNP-C [facility] [address] [place], [place] [address] [phone]

## 2019-02-26 NOTE — Patient Instructions (Signed)

## 2019-02-27 LAB — CMP14+EGFR
AST: 31 IU/L (ref 0–40)
Albumin/Globulin Ratio: 1.8 (ref 1.2–2.2)
Albumin: 4.4 g/dL (ref 3.8–4.8)
Alkaline Phosphatase: 106 IU/L (ref 39–117)
BUN/Creatinine Ratio: 15 (ref 12–28)
BUN: 17 mg/dL (ref 8–27)
Bilirubin Total: 0.4 mg/dL (ref 0.0–1.2)
CO2: 20 mmol/L (ref 20–29)
Calcium: 9.8 mg/dL (ref 8.7–10.3)
Chloride: 108 mmol/L — ABNORMAL HIGH (ref 96–106)
Creatinine, Ser: 1.12 mg/dL — ABNORMAL HIGH (ref 0.57–1.00)
GFR calc Af Amer: 58 mL/min/{1.73_m2} — ABNORMAL LOW (ref >59)
GFR calc non Af Amer: 50 mL/min/{1.73_m2} — ABNORMAL LOW (ref >59)
Globulin, Total: 2.5 g/dL (ref 1.5–4.5)
Glucose: 91 mg/dL (ref 65–99)
Potassium: 5.3 mmol/L — ABNORMAL HIGH (ref 3.5–5.2)
Sodium: 146 mmol/L — ABNORMAL HIGH (ref 134–144)
Total Protein: 6.9 g/dL (ref 6.0–8.5)

## 2019-02-27 LAB — LIPID PANEL
Chol/HDL Ratio: 4.8 ratio — ABNORMAL HIGH (ref 0.0–4.4)
Cholesterol, Total: 223 mg/dL — ABNORMAL HIGH (ref 100–199)
HDL: 46 mg/dL (ref >39)
LDL Chol Calc (NIH): 135 mg/dL — ABNORMAL HIGH (ref 0–99)
Triglycerides: 234 mg/dL — ABNORMAL HIGH (ref 0–149)
VLDL Cholesterol Cal: 42 mg/dL — ABNORMAL HIGH (ref 5–40)

## 2019-02-27 LAB — CBC WITH DIFFERENTIAL/PLATELET
Basophils Absolute: 0 10*3/uL (ref 0.0–0.2)
Basos: 0 %
EOS (ABSOLUTE): 0.2 10*3/uL (ref 0.0–0.4)
Eos: 2 %
Hematocrit: 43.6 % (ref 34.0–46.6)
Hemoglobin: 14.9 g/dL (ref 11.1–15.9)
Immature Grans (Abs): 0 10*3/uL (ref 0.0–0.1)
Immature Granulocytes: 0 %
Lymphocytes Absolute: 2 10*3/uL (ref 0.7–3.1)
Lymphs: 24 %
MCH: 29.2 pg (ref 26.6–33.0)
MCHC: 34.2 g/dL (ref 31.5–35.7)
MCV: 85 fL (ref 79–97)
Monocytes Absolute: 0.6 10*3/uL (ref 0.1–0.9)
Monocytes: 7 %
Neutrophils Absolute: 5.7 10*3/uL (ref 1.4–7.0)
Neutrophils: 67 %
Platelets: 255 10*3/uL (ref 150–450)
RBC: 5.11 x10E6/uL (ref 3.77–5.28)
RDW: 13.7 % (ref 11.7–15.4)
WBC: 8.5 10*3/uL (ref 3.4–10.8)

## 2019-02-27 LAB — MICROALBUMIN / CREATININE URINE RATIO
Creatinine, Urine: 393.2 mg/dL
Microalb/Creat Ratio: 9 mg/g creat (ref 0–29)
Microalbumin, Urine: 35.6 ug/mL

## 2019-02-27 MED ORDER — ATORVASTATIN CALCIUM 40 MG PO TABS: 40.0000 mg | ORAL_TABLET | Freq: Every day | ORAL | 1 refills | Status: DC

## 2019-02-27 NOTE — Addendum Note (Signed)
Addended by: Baruch Gouty on: 02/27/2019 02:32 PM   Modules accepted: Orders

## 2019-02-27 NOTE — Addendum Note (Signed)
Addended by: Baruch Gouty on: 02/27/2019 03:15 PM   Modules accepted: Orders

## 2019-02-28 ENCOUNTER — Ambulatory Visit (INDEPENDENT_AMBULATORY_CARE_PROVIDER_SITE_OTHER): Payer: Medicare Other | Admitting: Gastroenterology

## 2019-02-28 ENCOUNTER — Encounter: Payer: Self-pay | Admitting: Gastroenterology

## 2019-02-28 ENCOUNTER — Other Ambulatory Visit: Payer: Self-pay

## 2019-02-28 ENCOUNTER — Telehealth: Payer: Self-pay | Admitting: Gastroenterology

## 2019-02-28 VITALS — BP 134/66 | HR 65 | Temp 97.1°F | Ht 67.0 in | Wt 235.8 lb

## 2019-02-28 DIAGNOSIS — R1310 Dysphagia, unspecified: Secondary | ICD-10-CM | POA: Diagnosis not present

## 2019-02-28 DIAGNOSIS — R1319 Other dysphagia: Secondary | ICD-10-CM

## 2019-02-28 DIAGNOSIS — K219 Gastro-esophageal reflux disease without esophagitis: Secondary | ICD-10-CM

## 2019-02-28 MED ORDER — PANTOPRAZOLE SODIUM 40 MG PO TBEC: 40.0000 mg | DELAYED_RELEASE_TABLET | Freq: Two times a day (BID) | ORAL | 0 refills | Status: DC

## 2019-02-28 NOTE — Progress Notes (Signed)
Primary Care Physician: Baruch Gouty, FNP  Primary Gastroenterologist:  Barney Drain, MD   Chief Complaint  Patient presents with  . Dysphagia    burns when food goes down then comes back up    HPI: Allison Rollins is a 68 y.o. female here for follow-up of dysphagia.  She was seen back in April for complaints.  She had barium pill esophagram performed initially back in June.  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  She had severe diffuse impairment of esophageal motility as well.  Laryngeal penetration at the level of vocal cords without definite aspiration.  EGD in July with no endoscopic esophageal abnormality to explain her dysphagia.  Esophagus was dilated.  She had moderate erosive/nodular gastritis with benign biopsies.  Patient states that esophageal dilation helped her for about 2 weeks before her symptoms return.  She complains of daily issues with swallowing.  She feels like she has burning as the food goes down and within 1 to 2 minutes she will have regurgitation/vomiting.  Typically tolerates her breakfast which consist of egg and toast.  Lunch and evening meal tends to have more issues.  Baked chicken and mashed potatoes and corn in the evenings usually result in burning after swallowing and regurgitation/vomiting.  She feels like her medication wants to, but she swallows and swallows to keep them down.  Bowel movements are regular.  Denies any abdominal pain.  Feels like her heartburn is not well controlled either.  Current Outpatient Medications  Medication Sig Dispense Refill  . acetaminophen (TYLENOL) 500 MG tablet Take 500-1,000 mg by mouth every 6 (six) hours as needed for moderate pain (pain.).     Marland Kitchen albuterol (PROVENTIL HFA;VENTOLIN HFA) 108 (90 Base) MCG/ACT inhaler Inhale 2 puffs into the lungs every 6 (six) hours as needed for wheezing. 1 Inhaler 0  . albuterol  (PROVENTIL) (2.5 MG/3ML) 0.083% nebulizer solution Take 3 mLs (2.5 mg total) by nebulization every 6 (six) hours as needed for wheezing or shortness of breath. 150 mL 1  . Alcohol Swabs (B-D SINGLE USE SWABS REGULAR) PADS Test sugars daily 100 each 3  . aspirin EC 81 MG tablet Take 1 tablet (81 mg total) by mouth daily.    Marland Kitchen atorvastatin (LIPITOR) 40 MG tablet Take 1 tablet (40 mg total) by mouth daily. 90 tablet 1  . blood glucose meter kit and supplies Accu-check Nano smartview meter 1 each 0  . Blood Glucose Monitoring Suppl (ACCU-CHEK AVIVA PLUS) w/Device KIT Test sugars daily 1 kit 0  . cetirizine (ZYRTEC) 10 MG tablet Take 1 tablet (10 mg total) by mouth daily. 30 tablet 11  . cholecalciferol (VITAMIN D3) 25 MCG (1000 UT) tablet Take 1,000 Units by mouth 2 (two) times daily.     . fluticasone (FLONASE) 50 MCG/ACT nasal spray Place 2 sprays into both nostrils daily. (Patient taking differently: Place 2 sprays into both nostrils at bedtime. ) 16 g 6  . furosemide (LASIX) 40 MG tablet TAKE 1 TABLET TWICE DAILY AS NEEDED FOR  SWELLING, FLUID (Patient taking differently: Take 40 mg by mouth daily as needed (fluid retention.). ) 180 tablet 0  . gabapentin (NEURONTIN) 300 MG capsule 1 cap every morning & 2 caps every evening 360 capsule 0  . glucose blood (ACCU-CHEK AVIVA PLUS) test strip Test sugars daily 100 each 3  . lisinopril (ZESTRIL) 10 MG tablet  Take 1 tablet (10 mg total) by mouth daily. 90 tablet 1  . meloxicam (MOBIC) 7.5 MG tablet Take 1 tablet (7.5 mg total) by mouth daily as needed for pain. 90 tablet 0  . Multiple Vitamin (MULTIVITAMIN WITH MINERALS) TABS tablet Take 1 tablet by mouth daily.    . nitroGLYCERIN (NITROSTAT) 0.4 MG SL tablet Place 1 tablet (0.4 mg total) under the tongue every 5 (five) minutes as needed for chest pain. 20 tablet 3  . nystatin cream (MYCOSTATIN) Apply 1 application topically twice daily 30 g 11  . Omega-3 Fatty Acids (FISH OIL PO) Take 1 capsule by mouth  daily.     . pantoprazole (PROTONIX) 40 MG tablet Take 1 tablet (40 mg total) by mouth daily. 90 tablet 1  . potassium chloride SA (K-DUR) 20 MEQ tablet Take 20 mEq by mouth daily as needed (with lasix).    . pramipexole (MIRAPEX) 0.25 MG tablet Take 2 tablets (0.5 mg total) by mouth at bedtime. 180 tablet 1  . sertraline (ZOLOFT) 100 MG tablet Take 1 tablet (100 mg total) by mouth daily. (Patient taking differently: Take 100 mg by mouth at bedtime. ) 90 tablet 1   No current facility-administered medications for this visit.     Allergies as of 02/28/2019 - Review Complete 02/28/2019  Allergen Reaction Noted  . Penicillins Itching and Rash 09/08/2010  . Sulfa antibiotics Rash 07/01/2011    ROS:  General: Negative for anorexia, weight loss, fever, chills, fatigue, weakness. ENT: Negative for hoarseness,nasal congestion.  See HPI CV: Negative for chest pain, angina, palpitations, dyspnea on exertion, peripheral edema.  Respiratory: Negative for dyspnea at rest, dyspnea on exertion, cough, sputum, wheezing.  GI: See history of present illness. GU:  Negative for dysuria, hematuria, urinary incontinence, urinary frequency, nocturnal urination.  Endo: Negative for unusual weight change.    Physical Examination:   BP 134/66   Pulse 65   Temp (!) 97.1 F (36.2 C) (Temporal)   Ht 5' 7" (1.702 m)   Wt 235 lb 12.8 oz (107 kg)   BMI 36.93 kg/m   General: Well-nourished, well-developed in no acute distress.  Eyes: No icterus. Mouth: Oropharyngeal mucosa moist and pink , no lesions erythema or exudate. Lungs: Clear to auscultation bilaterally.  Heart: Regular rate and rhythm, no murmurs rubs or gallops.  Abdomen: Bowel sounds are normal, nontender, nondistended, no hepatosplenomegaly or masses, no abdominal bruits or hernia , no rebound or guarding.   Extremities: No lower extremity edema. No clubbing or deformities. Neuro: Alert and oriented x 4   Skin: Warm and dry, no jaundice.    Psych: Alert and cooperative, normal mood and affect.  Lab Results  Component Value Date   CREATININE 1.12 (H) 02/26/2019   BUN 17 02/26/2019   NA 146 (H) 02/26/2019   K 5.3 (H) 02/26/2019   CL 108 (H) 02/26/2019   CO2 20 02/26/2019   Lab Results  Component Value Date   WBC 8.5 02/26/2019   HGB 14.9 02/26/2019   HCT 43.6 02/26/2019   MCV 85 02/26/2019   PLT 255 02/26/2019   Lab Results  Component Value Date   ALT CANCELED 02/26/2019   AST 31 02/26/2019   ALKPHOS 106 02/26/2019   BILITOT 0.4 02/26/2019    Imaging Studies: No results found.     

## 2019-02-28 NOTE — Patient Instructions (Signed)
1. Increase your pantoprazole to twice daily. Take one 30-60 minutes before breakfast and one 30-60 minutes before evening meal. New RX sent to [facility].  2. I will touch base with Dr. Gala Romney (Dr. Oneida Alar is out of the office for two weeks). We will decide if you should have another barium swallow test versus a "pressure" test called a manometry as next step to evaluate your swallowing issues. We will call you with further recommendations.

## 2019-02-28 NOTE — Telephone Encounter (Signed)
Please let patient know that I spoke to RMR in SLF absence and he recommends esophageal manometry to further evaluate her swallowing issues.   If she is agreeable, please arrange do be done in [place].

## 2019-02-28 NOTE — Assessment & Plan Note (Signed)
Symptoms not ideally controlled.  Complains of breakthrough heartburn.  In addition she continues to have swallowing concerns.  Describes burning type pain during meals, located in the esophageal region.  Within 1 to 2 minutes of starting to eat she typically has regurgitation and/or vomiting.  Currently on pantoprazole once a day.  She had short-lived relief of swallowing issues after esophageal dilation back in July.  Within 2 weeks her symptoms started returning.  Based on previous barium esophagram findings which appear to have significant stricture at or above the GE junction with prolonged thoracic retention of contrast and obstruction of barium tablet, diffuse esophageal motility issues wondering if some of her symptoms are related to a motility disorder.  Question underlying achalasia.  I will discuss findings with Dr. Gala Romney as Dr. Oneida Alar is out of the office for 2 weeks.  Question utility of repeat barium esophagram to determine if persisting stricture versus pursuing esophageal manometry as next step.  We will go ahead and increase her pantoprazole to 40 mg twice daily before meals.

## 2019-03-01 NOTE — Addendum Note (Signed)
Addended by: Cheron Every on: 03/01/2019 09:19 AM   Modules accepted: Orders

## 2019-03-01 NOTE — Telephone Encounter (Signed)
Referral sent 

## 2019-03-01 NOTE — Telephone Encounter (Signed)
PT is aware and Ok to schedule. Forwarding to [facility].

## 2019-03-02 ENCOUNTER — Other Ambulatory Visit: Payer: Self-pay | Admitting: Family Medicine

## 2019-03-02 DIAGNOSIS — M545 Low back pain, unspecified: Secondary | ICD-10-CM

## 2019-03-02 DIAGNOSIS — G8929 Other chronic pain: Secondary | ICD-10-CM

## 2019-03-08 ENCOUNTER — Other Ambulatory Visit: Payer: Self-pay

## 2019-03-08 DIAGNOSIS — Z1159 Encounter for screening for other viral diseases: Secondary | ICD-10-CM

## 2019-03-08 DIAGNOSIS — R1319 Other dysphagia: Secondary | ICD-10-CM

## 2019-03-08 DIAGNOSIS — R1310 Dysphagia, unspecified: Secondary | ICD-10-CM

## 2019-03-12 ENCOUNTER — Ambulatory Visit: Payer: Medicare Other | Admitting: Gastroenterology

## 2019-03-13 ENCOUNTER — Other Ambulatory Visit: Payer: Self-pay

## 2019-03-13 ENCOUNTER — Other Ambulatory Visit: Payer: Medicare Other

## 2019-03-13 DIAGNOSIS — E875 Hyperkalemia: Secondary | ICD-10-CM

## 2019-03-13 DIAGNOSIS — N289 Disorder of kidney and ureter, unspecified: Secondary | ICD-10-CM

## 2019-03-14 LAB — BMP8+EGFR
BUN/Creatinine Ratio: 10 — ABNORMAL LOW (ref 12–28)
BUN: 10 mg/dL (ref 8–27)
CO2: 26 mmol/L (ref 20–29)
Calcium: 9.6 mg/dL (ref 8.7–10.3)
Chloride: 102 mmol/L (ref 96–106)
Creatinine, Ser: 1.05 mg/dL — ABNORMAL HIGH (ref 0.57–1.00)
GFR calc Af Amer: 63 mL/min/{1.73_m2} (ref >59)
GFR calc non Af Amer: 54 mL/min/{1.73_m2} — ABNORMAL LOW (ref >59)
Glucose: 111 mg/dL — ABNORMAL HIGH (ref 65–99)
Potassium: 4.7 mmol/L (ref 3.5–5.2)
Sodium: 143 mmol/L (ref 134–144)

## 2019-03-19 ENCOUNTER — Other Ambulatory Visit: Payer: Self-pay | Admitting: Gastroenterology

## 2019-03-19 ENCOUNTER — Telehealth: Payer: Self-pay | Admitting: Gastroenterology

## 2019-03-19 ENCOUNTER — Other Ambulatory Visit: Payer: Self-pay

## 2019-03-19 DIAGNOSIS — K219 Gastro-esophageal reflux disease without esophagitis: Secondary | ICD-10-CM

## 2019-03-19 NOTE — Telephone Encounter (Signed)
Pt needs to r/s her procedure at [facility] due to lack of transportation. Please call her within 15 minutes as she will be leaving. Otherwise, call her tomorrow.

## 2019-03-19 NOTE — Telephone Encounter (Signed)
Spoke with the patient. She does not have a ride to [place] for her COVID screening and will have to cancel that appointment and the manometry appointment. Needs the mano done before the new year. Agrees to go to [facility] for her covid testing. Changed the location for the testing,

## 2019-03-26 ENCOUNTER — Telehealth: Payer: Self-pay | Admitting: Gastroenterology

## 2019-03-26 NOTE — Telephone Encounter (Signed)
The pt has been advised that she will need to have the COVID test at the [facility] location or the procedure will be cancelled.  She says that she will try to find a ride and call back tomorrow if she needs to change the appt.

## 2019-03-26 NOTE — Telephone Encounter (Signed)
Called the pt and the line rings.  No answer and no voice mail.

## 2019-03-27 ENCOUNTER — Telehealth: Payer: Self-pay | Admitting: Gastroenterology

## 2019-03-27 NOTE — Telephone Encounter (Signed)
Patient is scheduled for COVID testing. She has a ride for this. She will use special [place] provided transportation for the esophageal manometry. Reviewed her instructions, what to do once she gets to [facility] and some of what to expect during the test. Reminded to wear a mask and follow distancing protocols.

## 2019-03-29 ENCOUNTER — Other Ambulatory Visit (HOSPITAL_COMMUNITY): Payer: Medicare Other

## 2019-03-29 ENCOUNTER — Other Ambulatory Visit (HOSPITAL_COMMUNITY)
Admission: RE | Admit: 2019-03-29 | Discharge: 2019-03-29 | Disposition: A | Discharge status: Home / Self Care | Payer: Medicare Other | Source: Ambulatory Visit | Attending: Gastroenterology | Admitting: Gastroenterology

## 2019-03-29 DIAGNOSIS — Z01818 Encounter for other preprocedural examination: Secondary | ICD-10-CM | POA: Insufficient documentation

## 2019-03-29 DIAGNOSIS — Z20828 Contact with and (suspected) exposure to other viral communicable diseases: Secondary | ICD-10-CM | POA: Diagnosis not present

## 2019-04-01 LAB — NOVEL CORONAVIRUS, NAA (HOSP ORDER, SEND-OUT TO REF LAB; TAT 18-24 HRS): SARS-CoV-2, NAA: NOT DETECTED

## 2019-04-02 ENCOUNTER — Encounter (HOSPITAL_COMMUNITY)
Admission: RE | Disposition: A | Discharge status: Home / Self Care | Payer: Self-pay | Source: Home / Self Care | Attending: Gastroenterology

## 2019-04-02 ENCOUNTER — Ambulatory Visit (HOSPITAL_COMMUNITY)
Admission: RE | Admit: 2019-04-02 | Discharge: 2019-04-02 | Disposition: A | Discharge status: Home / Self Care | Payer: Medicare Other | Attending: Gastroenterology | Admitting: Gastroenterology

## 2019-04-02 DIAGNOSIS — R1310 Dysphagia, unspecified: Secondary | ICD-10-CM | POA: Diagnosis not present

## 2019-04-02 DIAGNOSIS — K219 Gastro-esophageal reflux disease without esophagitis: Secondary | ICD-10-CM | POA: Diagnosis not present

## 2019-04-02 DIAGNOSIS — K220 Achalasia of cardia: Secondary | ICD-10-CM

## 2019-04-02 HISTORY — PX: ESOPHAGEAL MANOMETRY: SHX5429

## 2019-04-02 SURGERY — MANOMETRY, ESOPHAGUS

## 2019-04-02 MED ORDER — LIDOCAINE VISCOUS HCL 2 % MT SOLN
OROMUCOSAL | Status: AC
  Filled 2019-04-02: qty 15

## 2019-04-02 SURGICAL SUPPLY — 2 items
FACESHIELD LNG OPTICON STERILE (SAFETY) IMPLANT
GLOVE BIO SURGEON STRL SZ8 (GLOVE) ×4 IMPLANT

## 2019-04-02 NOTE — Progress Notes (Signed)
Esophageal manometry performed per protocol without complication.  Patient tolerated well 

## 2019-04-06 ENCOUNTER — Encounter: Payer: Self-pay | Admitting: *Deleted

## 2019-04-09 ENCOUNTER — Telehealth: Payer: Self-pay | Admitting: Gastroenterology

## 2019-04-09 ENCOUNTER — Telehealth: Payer: Self-pay | Admitting: *Deleted

## 2019-04-09 NOTE — Telephone Encounter (Signed)
Patient called wanting to know if we got her results from the procedure she had done at [facility]

## 2019-04-09 NOTE — Telephone Encounter (Signed)
Results are not available at this time. She was notified by [facility] that it could take two weeks to get results.

## 2019-04-09 NOTE — Telephone Encounter (Signed)
Message sent to provider. Waiting on a response.

## 2019-04-09 NOTE — Telephone Encounter (Signed)
Pt wants to discuss results of manometry.  [phone]

## 2019-04-09 NOTE — Telephone Encounter (Signed)
Pt is inquiring about Manometry results. Pt state she's worried since she eats soft food like eggs and it comes right back up. When pt eats mashed potatoes, it stays down.

## 2019-04-09 NOTE — Telephone Encounter (Signed)
Patient is an outside referral from Dr Oneida Alar looking for her esophageal mano results. Explained it would take up to 2 weeks to get the final results.

## 2019-04-10 DIAGNOSIS — K220 Achalasia of cardia: Secondary | ICD-10-CM

## 2019-04-10 NOTE — Telephone Encounter (Signed)
Spoke with pt. Pt is aware that her results are not in and it could take up to 2 weeks after having her procedure to receive them.

## 2019-04-11 ENCOUNTER — Telehealth: Payer: Self-pay | Admitting: Gastroenterology

## 2019-04-12 NOTE — Telephone Encounter (Signed)
Pt called in today wanting to follow up.  Wants results.  [phone]

## 2019-04-12 NOTE — Telephone Encounter (Signed)
Spoke with the patient. Advised the results can take up to 2 weeks before available.  She has many questions about her diet, swallowing and her indigestion. Encouraged her to contact her primary GI with her questions and concerns.

## 2019-04-13 ENCOUNTER — Telehealth: Payer: Self-pay | Admitting: Gastroenterology

## 2019-04-13 DIAGNOSIS — R1310 Dysphagia, unspecified: Secondary | ICD-10-CM

## 2019-04-13 DIAGNOSIS — K220 Achalasia of cardia: Secondary | ICD-10-CM

## 2019-04-13 DIAGNOSIS — R1319 Other dysphagia: Secondary | ICD-10-CM

## 2019-04-13 NOTE — Telephone Encounter (Signed)
Spoke with pt. Pt is aware that she was advised to wait 2 week by [facility]. Results haven't been given to our office as of yet.

## 2019-04-13 NOTE — Telephone Encounter (Signed)
Pt called asking if SF or LSL had gotten her results back yet. I told her SF and DS were not here today and she said that LSL had ordered it and LSL could call her. I told her LSL was with patients at the moment, but I would put a message in for the nurse to call. [phone]

## 2019-04-16 ENCOUNTER — Telehealth: Payer: Self-pay | Admitting: Gastroenterology

## 2019-04-16 NOTE — Telephone Encounter (Signed)
[facility] has tried to call her. They will be trying again. She should watch for the call.

## 2019-04-16 NOTE — Telephone Encounter (Signed)
Noted  

## 2019-04-16 NOTE — Telephone Encounter (Signed)
Patient is calling about her results- I let her know that results were back and that [facility] would be reaching out to her with in the next few days- but she still wants to speak to you about what she has- kept stating the doctor at [facility] is not there and they won't call her- and I told her that the nurse of the doctor would probably call and she said no they wont. and she states she just can't eat and don't know what to eat.

## 2019-04-16 NOTE — Telephone Encounter (Signed)
Manometry results are in the chart under Media.  The patient is aware that [facility] is on vacation.  She asked if Magda Paganini could give her some kind of advice regarding her test in SLFs absence.  Patient stated she was told at [facility] to stay on soft foods and she would like to know if she can advance her diet to solid foods.  The patient can be reached at [phone]

## 2019-04-16 NOTE — Telephone Encounter (Signed)
Tried to call patient. LMOAM for return call tomorrow. I will go over results with her.   Her manometry showed achalasia and she will need referral to [facility] or [facility].

## 2019-04-17 ENCOUNTER — Telehealth: Payer: Self-pay | Admitting: Gastroenterology

## 2019-04-17 NOTE — Telephone Encounter (Signed)
Spoke to patient. Awaiting input from Dr. Oneida Alar on her return regarding possible surgical referral. Patient would prefer [facility]. If not, then [facility].  Please mail patient information on Achalasia. I have printed out some information from [employer], it's on my desk.

## 2019-04-17 NOTE — Telephone Encounter (Signed)
I have mailed the info to the pt.

## 2019-04-17 NOTE — Telephone Encounter (Signed)
Pt was calling to speak to LSL about her results. Please call [phone]

## 2019-04-19 ENCOUNTER — Other Ambulatory Visit: Payer: Self-pay | Admitting: Family Medicine

## 2019-04-19 DIAGNOSIS — J301 Allergic rhinitis due to pollen: Secondary | ICD-10-CM

## 2019-04-23 ENCOUNTER — Telehealth: Payer: Self-pay | Admitting: Gastroenterology

## 2019-04-23 NOTE — Telephone Encounter (Signed)
REVIEWED-refer to [facility], Dx: ACHALASIA/DYSPHAGIA.

## 2019-04-23 NOTE — Telephone Encounter (Signed)
Referral faxed to [facility]. 

## 2019-04-23 NOTE — Addendum Note (Signed)
Addended by: Hassan Rowan on: 04/23/2019 03:47 PM   Modules accepted: Orders

## 2019-04-23 NOTE — Telephone Encounter (Signed)
Pt called asking if SF would call her. She is "scared too death" and said she would prefer going to [place], but will go anywhere. She received the papers that LSL had mailed her and would like for SF to call her. [phone]

## 2019-04-23 NOTE — Telephone Encounter (Signed)
Called patient TO DISCUSS CONCERNS. LVM-CALL [phone] TO DISCUSS. REFERRAL TO [facility] SENT.

## 2019-04-24 ENCOUNTER — Telehealth: Payer: Self-pay | Admitting: Gastroenterology

## 2019-04-24 NOTE — Telephone Encounter (Signed)
PT is aware of her referral to [facility] and she has already received an appt date of 05/02/2019.

## 2019-04-24 NOTE — Telephone Encounter (Signed)
Called, many rings and no answer.

## 2019-04-24 NOTE — Telephone Encounter (Signed)
PATIENT WANTS TO TALK TO SOMEONE ABOUT HER REFERRAL TO [facility]

## 2019-04-24 NOTE — Telephone Encounter (Signed)
REVIEWED-NO ADDITIONAL RECOMMENDATIONS. 

## 2019-04-25 ENCOUNTER — Other Ambulatory Visit: Payer: Self-pay | Admitting: Family Medicine

## 2019-04-25 DIAGNOSIS — M545 Low back pain, unspecified: Secondary | ICD-10-CM

## 2019-04-25 DIAGNOSIS — G8929 Other chronic pain: Secondary | ICD-10-CM

## 2019-04-25 DIAGNOSIS — G629 Polyneuropathy, unspecified: Secondary | ICD-10-CM

## 2019-04-25 DIAGNOSIS — I10 Essential (primary) hypertension: Secondary | ICD-10-CM

## 2019-04-25 NOTE — Telephone Encounter (Signed)
Called patient TO DISCUSS CONCERNS. PT CAN'T EAT. CUTTING HAMBURGER WITH SCISSORS. HAVING TROUBLE WITH CHEST BURNING A LITTLE. RECOMMENDED FULL LIQUID DIET-SOUPS NO CHUNKS, POPSICLES, ICE CREAM. ENSURE OR BOOST 4 CANS/BOTTLES A DAY. KEEP APPT AT [facility].

## 2019-05-21 ENCOUNTER — Other Ambulatory Visit: Payer: Self-pay | Admitting: Family Medicine

## 2019-05-21 DIAGNOSIS — J301 Allergic rhinitis due to pollen: Secondary | ICD-10-CM

## 2019-05-25 ENCOUNTER — Other Ambulatory Visit: Payer: Self-pay | Admitting: Family Medicine

## 2019-05-25 DIAGNOSIS — E785 Hyperlipidemia, unspecified: Secondary | ICD-10-CM

## 2019-05-25 DIAGNOSIS — E1169 Type 2 diabetes mellitus with other specified complication: Secondary | ICD-10-CM

## 2019-05-25 DIAGNOSIS — G2581 Restless legs syndrome: Secondary | ICD-10-CM

## 2019-05-30 ENCOUNTER — Other Ambulatory Visit: Payer: Self-pay

## 2019-05-31 ENCOUNTER — Encounter: Payer: Self-pay | Admitting: Family Medicine

## 2019-05-31 ENCOUNTER — Ambulatory Visit (INDEPENDENT_AMBULATORY_CARE_PROVIDER_SITE_OTHER): Payer: Medicare Other | Admitting: Family Medicine

## 2019-05-31 VITALS — BP 112/72 | HR 77 | Temp 97.3°F | Resp 20 | Ht 67.0 in | Wt 222.0 lb

## 2019-05-31 DIAGNOSIS — E1169 Type 2 diabetes mellitus with other specified complication: Secondary | ICD-10-CM | POA: Diagnosis not present

## 2019-05-31 DIAGNOSIS — E1159 Type 2 diabetes mellitus with other circulatory complications: Secondary | ICD-10-CM | POA: Diagnosis not present

## 2019-05-31 DIAGNOSIS — I152 Hypertension secondary to endocrine disorders: Secondary | ICD-10-CM

## 2019-05-31 DIAGNOSIS — N183 Chronic kidney disease, stage 3 unspecified: Secondary | ICD-10-CM | POA: Insufficient documentation

## 2019-05-31 DIAGNOSIS — E1142 Type 2 diabetes mellitus with diabetic polyneuropathy: Secondary | ICD-10-CM

## 2019-05-31 DIAGNOSIS — I10 Essential (primary) hypertension: Secondary | ICD-10-CM

## 2019-05-31 DIAGNOSIS — E1122 Type 2 diabetes mellitus with diabetic chronic kidney disease: Secondary | ICD-10-CM | POA: Insufficient documentation

## 2019-05-31 DIAGNOSIS — E785 Hyperlipidemia, unspecified: Secondary | ICD-10-CM

## 2019-05-31 DIAGNOSIS — F339 Major depressive disorder, recurrent, unspecified: Secondary | ICD-10-CM

## 2019-05-31 LAB — BAYER DCA HB A1C WAIVED: HB A1C (BAYER DCA - WAIVED): 6 % (ref <7.0)

## 2019-05-31 NOTE — Progress Notes (Signed)
Subjective:  Patient ID: Allison Rollins, female    DOB: 08/09/1950, 69 y.o.   MRN: [id number]  Patient Care Team: Baruch Gouty, FNP as PCP - General (Family Medicine) Harl Bowie, Alphonse Guild, MD as PCP - Cardiology (Cardiology) Danie Binder, MD (Gastroenterology) Phillips Odor, MD as Consulting Physician (Neurology) Harl Bowie, Alphonse Guild, MD as Consulting Physician (Cardiology)   Chief Complaint:  Medical Management of Chronic Issues (3 mo ), Diabetes, Hypertension, and Hyperlipidemia   HPI: Allison Rollins is a 69 y.o. female presenting on 05/31/2019 for Medical Management of Chronic Issues (3 mo ), Diabetes, Hypertension, and Hyperlipidemia  1. Type 2 diabetes mellitus with diabetic polyneuropathy, without long-term current use of insulin (HCC) Diet controlled. Does try to watch what she eats. No reported hypoglycemia or hyperglycemia. No polyuria, polyphagia, or polydipsia.   2. Hypertension associated with diabetes ([place]) Complaint with meds - Yes Current Medications - lasix, lisinopril Checking BP at home - No Exercising Regularly - No Watching Salt intake - Yes Pertinent ROS:  Headache - No Fatigue - No Visual Disturbances - No Chest pain - No Dyspnea - No Palpitations - No LE edema - Yes They report good compliance with medications and can restate their regimen by memory. No medication side effects.  Family, social, and smoking history reviewed.   BP Readings from Last 3 Encounters:  05/31/19 112/72  02/28/19 134/66  02/26/19 115/74   CMP Latest Ref Rng & Units 03/13/2019 02/26/2019 11/24/2018  Glucose 65 - 99 mg/dL 111(H) 91 85  BUN 8 - 27 mg/dL 10 17 15   Creatinine 0.57 - 1.00 mg/dL 1.05(H) 1.12(H) 0.84  Sodium 134 - 144 mmol/L 143 146(H) 146(H)  Potassium 3.5 - 5.2 mmol/L 4.7 5.3(H) 4.7  Chloride 96 - 106 mmol/L 102 108(H) 105  CO2 20 - 29 mmol/L 26 20 23   Calcium 8.7 - 10.3 mg/dL 9.6 9.8 9.6  Total Protein 6.0 - 8.5 g/dL - 6.9 6.7  Total Bilirubin 0.0 - 1.2  mg/dL - 0.4 0.4  Alkaline Phos 39 - 117 IU/L - 106 101  AST 0 - 40 IU/L - 31 22  ALT IU/L - CANCELED 16      3. Hyperlipidemia associated with type 2 diabetes mellitus ([place]) Compliant with medications - Yes Current medications - atorvastatin Side effects from medications - No Diet - tries to watch Exercise - unable due to gait instability.   Lab Results  Component Value Date   CHOL 223 (H) 02/26/2019   HDL 46 02/26/2019   LDLCALC 135 (H) 02/26/2019   TRIG 234 (H) 02/26/2019   CHOLHDL 4.8 (H) 02/26/2019     Family and personal medical history reviewed. Smoking and ETOH history reviewed.    4. Morbid obesity ([place]) Has lost a few pounds since procedure and states she is going to try to keep it off. Exercise limited due to gait instability.   5. Recurrent depression ([place]) States she is doing very well on current regimen. No worsening symptoms. No SI or HI.  Depression screen [facility] 2/9 05/31/2019 02/26/2019 02/22/2019 11/24/2018 11/24/2018  Decreased Interest 0 0 0 0 0  Down, Depressed, Hopeless 0 0 0 0 0  PHQ - 2 Score 0 0 0 0 0  Altered sleeping - - - 0 -  Tired, decreased energy - - - 0 -  Change in appetite - - - 0 -  Feeling bad or failure about yourself  - - - 0 -  Trouble concentrating - - -  0 -  Moving slowly or fidgety/restless - - - 0 -  Suicidal thoughts - - - 0 -  PHQ-9 Score - - - 0 -  Difficult doing work/chores - - - - -  Some recent data might be hidden        Relevant past medical, surgical, family, and social history reviewed and updated as indicated.  Allergies and medications reviewed and updated. Date reviewed: Chart in Epic.   Past Medical History:  Diagnosis Date  . Anxiety   . Asthma   . Chest pain 12/22/2015  . CHF (congestive heart failure) ([place])   . COPD (chronic obstructive pulmonary disease) ([place])   . Depression   . Diabetes mellitus without complication ([place])   . Headache   . HTN (hypertension)   . Hypoglycemia   . Hypokalemia  07/28/2013  . Kidney stones   . Parkinson's disease   . Pneumonia   . Pre-diabetes   . S/P colonoscopy 10/15/04   normal    Past Surgical History:  Procedure Laterality Date  . CHOLECYSTECTOMY    . COLONOSCOPY  10/15/2004   Normal rectum/Normal colon  . COLONOSCOPY N/A 05/10/2016   Procedure: COLONOSCOPY;  Surgeon: Danie Binder, MD;  [facility];  Service: Endoscopy;  Laterality: N/A;  10:30  . ESOPHAGEAL MANOMETRY N/A 04/02/2019   Procedure: ESOPHAGEAL MANOMETRY (EM);  Surgeon: Mauri Pole, MD;  Location: [facility];  Service: Endoscopy;  Laterality: N/A;  . ESOPHAGOGASTRODUODENOSCOPY  09/11/2010   Dysphagia likely multifactorial (possible Candida esophagitis, likely nonspecific esophageal motility disorder, and/or uncontrolled gastroesophageal reflux disease), status post empiric dilation  . ESOPHAGOGASTRODUODENOSCOPY N/A 11/13/2018   Dr. Oneida Alar: No endoscopic esophageal abnormality to explain patient's dysphagia.  Esophagus dilated.  Moderate erosive/nodular gastritis with benign biopsies.  Marland Kitchen KIDNEY STONE SURGERY    . SAVORY DILATION N/A 11/13/2018   Procedure: SAVORY DILATION;  Surgeon: Danie Binder, MD;  Location: [facility];  Service: Endoscopy;  Laterality: N/A;    Social History   Socioeconomic History  . Marital status: Widowed    Spouse name: Not on file  . Number of children: 2  . Years of education: Not on file  . Highest education level: Not on file  Occupational History  . Occupation: disabled  Tobacco Use  . Smoking status: Former Smoker    Packs/day: 0.25    Years: 30.00    Pack years: 7.50    Types: Cigarettes    Start date: 01/04/1965    Quit date: 01/03/1995    Years since quitting: 24.4  . Smokeless tobacco: Never Used  Substance and Sexual Activity  . Alcohol use: No    Alcohol/week: 0.0 standard drinks  . Drug use: No  . Sexual activity: Not on file  Other Topics Concern  . Not on file  Social History Narrative  . Not on  file   Social Determinants of Health   Financial Resource Strain:   . Difficulty of Paying Living Expenses: Not on file  Food Insecurity:   . Worried About Charity fundraiser in the Last Year: Not on file  . Ran Out of Food in the Last Year: Not on file  Transportation Needs:   . Lack of Transportation (Medical): Not on file  . Lack of Transportation (Non-Medical): Not on file  Physical Activity:   . Days of Exercise per Week: Not on file  . Minutes of Exercise per Session: Not on file  Stress:   . Feeling  of Stress : Not on file  Social Connections:   . Frequency of Communication with Friends and Family: Not on file  . Frequency of Social Gatherings with Friends and Family: Not on file  . Attends Religious Services: Not on file  . Active Member of Clubs or Organizations: Not on file  . Attends Archivist Meetings: Not on file  . Marital Status: Not on file  Intimate Partner Violence:   . Fear of Current or Ex-Partner: Not on file  . Emotionally Abused: Not on file  . Physically Abused: Not on file  . Sexually Abused: Not on file    Outpatient Encounter Medications as of 05/31/2019  Medication Sig  . acetaminophen (TYLENOL) 500 MG tablet Take 500-1,000 mg by mouth every 6 (six) hours as needed for moderate pain (pain.).   Marland Kitchen albuterol (PROVENTIL HFA;VENTOLIN HFA) 108 (90 Base) MCG/ACT inhaler Inhale 2 puffs into the lungs every 6 (six) hours as needed for wheezing.  Marland Kitchen albuterol (PROVENTIL) (2.5 MG/3ML) 0.083% nebulizer solution Take 3 mLs (2.5 mg total) by nebulization every 6 (six) hours as needed for wheezing or shortness of breath.  . Alcohol Swabs (B-D SINGLE USE SWABS REGULAR) PADS Test sugars daily  . aspirin EC 81 MG tablet Take 1 tablet (81 mg total) by mouth daily.  Marland Kitchen atorvastatin (LIPITOR) 40 MG tablet Take 1 tablet by mouth every day  . blood glucose meter kit and supplies Accu-check Nano smartview meter  . Blood Glucose Monitoring Suppl (ACCU-CHEK AVIVA  PLUS) w/Device KIT Test sugars daily  . cetirizine (ZYRTEC) 10 MG tablet Take 1 tablet by mouth every day  . cholecalciferol (VITAMIN D3) 25 MCG (1000 UT) tablet Take 1,000 Units by mouth 2 (two) times daily.   . fluticasone (FLONASE) 50 MCG/ACT nasal spray Place 2 sprays into both nostrils daily. (Patient taking differently: Place 2 sprays into both nostrils at bedtime. )  . furosemide (LASIX) 20 MG tablet Take 1 tablet by mouth every day as needed  . furosemide (LASIX) 40 MG tablet TAKE 1 TABLET TWICE DAILY AS NEEDED FOR  SWELLING, FLUID (Patient taking differently: Take 40 mg by mouth daily as needed (fluid retention.). )  . gabapentin (NEURONTIN) 300 MG capsule Take 1 capsule in morning & 2 caps every evening  . glucose blood (ACCU-CHEK AVIVA PLUS) test strip Test sugars daily  . HYDROcodone-acetaminophen (NORCO/VICODIN) 5-325 MG tablet Take 1 tablet by mouth 3 (three) times daily as needed.  Marland Kitchen lisinopril (ZESTRIL) 10 MG tablet Take 1 tablet by mouth every day  . meloxicam (MOBIC) 7.5 MG tablet Take 1 tab by mouth every day as needed for pain  . Multiple Vitamin (MULTIVITAMIN WITH MINERALS) TABS tablet Take 1 tablet by mouth daily.  . nitroGLYCERIN (NITROSTAT) 0.4 MG SL tablet Place 1 tablet (0.4 mg total) under the tongue every 5 (five) minutes as needed for chest pain.  Marland Kitchen nystatin cream (MYCOSTATIN) Apply 1 application topically twice daily  . Omega-3 Fatty Acids (FISH OIL PO) Take 1 capsule by mouth daily.   . pantoprazole (PROTONIX) 40 MG tablet TAKE ONE TABLET BY MOUTH TWICE DAILY  . potassium chloride SA (K-DUR) 20 MEQ tablet Take 20 mEq by mouth daily as needed (with lasix).  . pramipexole (MIRAPEX) 0.25 MG tablet Take 2 tablets by mouth at bedtime  . sertraline (ZOLOFT) 100 MG tablet Take 1 tablet by mouth every day   No facility-administered encounter medications on file as of 05/31/2019.    Allergies  Allergen Reactions  .  Penicillins Itching and Rash    Has patient had a PCN  reaction causing immediate rash, facial/tongue/throat swelling, SOB or lightheadedness with hypotension: no Has patient had a PCN reaction causing severe rash involving mucus membranes or skin necrosis: no Has patient had a PCN reaction that required hospitalization: no Has patient had a PCN reaction occurring within the last 10 years: no If all of the above answers are "NO", then may proceed with Cephalosporin use.   . Sulfa Antibiotics Rash    Review of Systems  Constitutional: Negative for activity change, appetite change, chills, diaphoresis, fatigue, fever and unexpected weight change.  HENT: Positive for sore throat and trouble swallowing (recent EGD at [facility], still healing, but has improved greatly). Negative for postnasal drip, rhinorrhea, sinus pressure, sinus pain, sneezing and voice change.   Eyes: Negative.  Negative for photophobia and visual disturbance.  Respiratory: Negative for cough, chest tightness and shortness of breath.   Cardiovascular: Positive for leg swelling. Negative for chest pain and palpitations.  Gastrointestinal: Negative for abdominal pain, anal bleeding, blood in stool, constipation, diarrhea, nausea and vomiting.  Endocrine: Negative.  Negative for cold intolerance, heat intolerance, polydipsia, polyphagia and polyuria.  Genitourinary: Negative for decreased urine volume, difficulty urinating, dysuria, frequency and urgency.  Musculoskeletal: Positive for gait problem (uses cane). Negative for arthralgias and myalgias.  Skin: Negative.   Allergic/Immunologic: Negative.   Neurological: Negative for dizziness, tremors, seizures, syncope, facial asymmetry, speech difficulty, weakness, light-headedness, numbness and headaches.  Hematological: Negative.   Psychiatric/Behavioral: Negative for confusion, hallucinations, sleep disturbance and suicidal ideas.  All other systems reviewed and are negative.       Objective:  BP 112/72   Pulse 77   Temp (!) 97.3  F (36.3 C)   Resp 20   Ht 5' 7"  (1.702 m)   Wt 222 lb (100.7 kg)   SpO2 94%   BMI 34.77 kg/m    Wt Readings from Last 3 Encounters:  05/31/19 222 lb (100.7 kg)  02/28/19 235 lb 12.8 oz (107 kg)  02/26/19 236 lb (107 kg)    Physical Exam Vitals and nursing note reviewed.  Constitutional:      General: She is not in acute distress.    Appearance: Normal appearance. She is well-developed and well-groomed. She is obese. She is not ill-appearing, toxic-appearing or diaphoretic.  HENT:     Head: Normocephalic and atraumatic.     Jaw: There is normal jaw occlusion.     Right Ear: Hearing normal.     Left Ear: Hearing normal.     Nose: Nose normal.     Mouth/Throat:     Lips: Pink.     Mouth: Mucous membranes are moist.     Pharynx: Oropharynx is clear. Uvula midline.  Eyes:     General: Lids are normal.     Extraocular Movements: Extraocular movements intact.     Conjunctiva/sclera: Conjunctivae normal.     Pupils: Pupils are equal, round, and reactive to light.  Neck:     Thyroid: No thyroid mass, thyromegaly or thyroid tenderness.     Vascular: No carotid bruit or JVD.     Trachea: Trachea and phonation normal.  Cardiovascular:     Rate and Rhythm: Normal rate and regular rhythm.     Chest Wall: PMI is not displaced.     Pulses: Normal pulses.     Heart sounds: Normal heart sounds. No murmur. No friction rub. No gallop.   Pulmonary:  Effort: Pulmonary effort is normal. No respiratory distress.     Breath sounds: Normal breath sounds. No wheezing.  Abdominal:     General: Bowel sounds are normal. There is no distension or abdominal bruit.     Palpations: Abdomen is soft. There is no hepatomegaly or splenomegaly.     Tenderness: There is no abdominal tenderness. There is no right CVA tenderness or left CVA tenderness.     Hernia: No hernia is present.  Musculoskeletal:        General: Normal range of motion.     Cervical back: Normal range of motion and neck supple.      Right lower leg: 1+ Edema present.     Left lower leg: 1+ Edema present.  Lymphadenopathy:     Cervical: No cervical adenopathy.  Skin:    General: Skin is warm and dry.     Capillary Refill: Capillary refill takes less than 2 seconds.     Coloration: Skin is not cyanotic, jaundiced or pale.     Findings: No rash.  Neurological:     General: No focal deficit present.     Mental Status: She is alert and oriented to person, place, and time.     Cranial Nerves: Cranial nerves are intact.     Sensory: Sensation is intact.     Motor: Motor function is intact.     Coordination: Coordination is intact.     Gait: Gait abnormal (uses cane, unsteady).     Deep Tendon Reflexes: Reflexes are normal and symmetric.  Psychiatric:        Attention and Perception: Attention and perception normal.        Mood and Affect: Mood and affect normal.        Speech: Speech normal.        Behavior: Behavior normal. Behavior is cooperative.        Thought Content: Thought content normal.        Cognition and Memory: Cognition and memory normal.        Judgment: Judgment normal.     Results for orders placed or performed during the hospital encounter of 03/29/19  Novel Coronavirus, NAA (Hosp order, Send-out to Ref Lab; TAT 18-24 hrs   Specimen: Nasopharyngeal Swab; Respiratory  Result Value Ref Range   SARS-CoV-2, NAA NOT DETECTED NOT DETECTED   Coronavirus Source NASOPHARYNGEAL        Pertinent labs & imaging results that were available during my care of the patient were reviewed by me and considered in my medical decision making.  Assessment & Plan:  Kamrin was seen today for medical management of chronic issues, diabetes, hypertension and hyperlipidemia.  Diagnoses and all orders for this visit:  Type 2 diabetes mellitus with diabetic polyneuropathy, without long-term current use of insulin (HCC) A1C 6.0, no changes in regimen today. Diet and exercise encouraged.  -     CMP14+EGFR -      Bayer DCA Hb A1c Waived -     Lipid panel  Hypertension associated with diabetes ([place]) BP well controlled. Changes were not made in regimen today. Goal BP is 130/80. Pt aware to report any persistent high or low readings. DASH diet and exercise encouraged. Exercise at least 150 minutes per week and increase as tolerated. Goal BMI > 25. Stress management encouraged. Avoid nicotine and tobacco product use. Avoid excessive alcohol and NSAID's. Avoid more than 2000 mg of sodium daily. Medications as prescribed. Follow up as scheduled.  -  CMP14+EGFR -     CBC with Differential/Platelet -     Lipid panel  Hyperlipidemia associated with type 2 diabetes mellitus ([place]) Diet encouraged - increase intake of fresh fruits and vegetables, increase intake of lean proteins. Bake, broil, or grill foods. Avoid fried, greasy, and fatty foods. Avoid fast foods. Increase intake of fiber-rich whole grains. Exercise encouraged - at least 150 minutes per week and advance as tolerated.  Goal BMI < 25. Continue medications as prescribed. Follow up in 3-6 months as discussed.  -     Lipid panel  Morbid obesity ([place]) Has lost a few pounds and states she is determined to keep it off. Labs pending. Diet and exercise encouraged.  -     CMP14+EGFR -     CBC with Differential/Platelet  Recurrent depression ([place]) Doing well on current regimen, will continue.     Continue all other maintenance medications.  Follow up plan: Return in about 3 months (around 08/28/2019), or if symptoms worsen or fail to improve, for DM, HTN, Lipids.  Continue healthy lifestyle choices, including diet (rich in fruits, vegetables, and lean proteins, and low in salt and simple carbohydrates) and exercise (at least 30 minutes of moderate physical activity daily).  Educational handout given for DM  The above assessment and management plan was discussed with the patient. The patient verbalized understanding of and has agreed to the management  plan. Patient is aware to call the clinic if they develop any new symptoms or if symptoms persist or worsen. Patient is aware when to return to the clinic for a follow-up visit. Patient educated on when it is appropriate to go to the emergency department.   Monia Pouch, FNP-C [facility] [phone]

## 2019-05-31 NOTE — Patient Instructions (Signed)

## 2019-06-01 LAB — CMP14+EGFR
ALT: 16 IU/L (ref 0–32)
AST: 18 IU/L (ref 0–40)
Albumin/Globulin Ratio: 1.9 (ref 1.2–2.2)
Albumin: 4.1 g/dL (ref 3.8–4.8)
Alkaline Phosphatase: 98 IU/L (ref 39–117)
BUN/Creatinine Ratio: 9 — ABNORMAL LOW (ref 12–28)
BUN: 9 mg/dL (ref 8–27)
Bilirubin Total: 0.6 mg/dL (ref 0.0–1.2)
CO2: 24 mmol/L (ref 20–29)
Calcium: 9.4 mg/dL (ref 8.7–10.3)
Chloride: 106 mmol/L (ref 96–106)
Creatinine, Ser: 1.02 mg/dL — ABNORMAL HIGH (ref 0.57–1.00)
GFR calc Af Amer: 65 mL/min/{1.73_m2} (ref >59)
GFR calc non Af Amer: 56 mL/min/{1.73_m2} — ABNORMAL LOW (ref >59)
Globulin, Total: 2.2 g/dL (ref 1.5–4.5)
Glucose: 106 mg/dL — ABNORMAL HIGH (ref 65–99)
Potassium: 4.7 mmol/L (ref 3.5–5.2)
Sodium: 145 mmol/L — ABNORMAL HIGH (ref 134–144)
Total Protein: 6.3 g/dL (ref 6.0–8.5)

## 2019-06-01 LAB — LIPID PANEL
Chol/HDL Ratio: 3.7 ratio (ref 0.0–4.4)
Cholesterol, Total: 146 mg/dL (ref 100–199)
HDL: 39 mg/dL — ABNORMAL LOW (ref >39)
LDL Chol Calc (NIH): 83 mg/dL (ref 0–99)
Triglycerides: 132 mg/dL (ref 0–149)
VLDL Cholesterol Cal: 24 mg/dL (ref 5–40)

## 2019-06-01 LAB — CBC WITH DIFFERENTIAL/PLATELET
Basophils Absolute: 0 10*3/uL (ref 0.0–0.2)
Basos: 0 %
EOS (ABSOLUTE): 0.2 10*3/uL (ref 0.0–0.4)
Eos: 3 %
Hematocrit: 43.7 % (ref 34.0–46.6)
Hemoglobin: 14.8 g/dL (ref 11.1–15.9)
Immature Grans (Abs): 0 10*3/uL (ref 0.0–0.1)
Immature Granulocytes: 0 %
Lymphocytes Absolute: 1.2 10*3/uL (ref 0.7–3.1)
Lymphs: 15 %
MCH: 29.7 pg (ref 26.6–33.0)
MCHC: 33.9 g/dL (ref 31.5–35.7)
MCV: 88 fL (ref 79–97)
Monocytes Absolute: 0.5 10*3/uL (ref 0.1–0.9)
Monocytes: 7 %
Neutrophils Absolute: 5.7 10*3/uL (ref 1.4–7.0)
Neutrophils: 75 %
Platelets: 240 10*3/uL (ref 150–450)
RBC: 4.99 x10E6/uL (ref 3.77–5.28)
RDW: 13 % (ref 11.7–15.4)
WBC: 7.6 10*3/uL (ref 3.4–10.8)

## 2019-06-24 ENCOUNTER — Other Ambulatory Visit: Payer: Self-pay | Admitting: Family Medicine

## 2019-06-24 DIAGNOSIS — G629 Polyneuropathy, unspecified: Secondary | ICD-10-CM

## 2019-06-24 DIAGNOSIS — I10 Essential (primary) hypertension: Secondary | ICD-10-CM

## 2019-07-13 DIAGNOSIS — K220 Achalasia of cardia: Secondary | ICD-10-CM | POA: Diagnosis not present

## 2019-07-13 DIAGNOSIS — R6889 Other general symptoms and signs: Secondary | ICD-10-CM | POA: Diagnosis not present

## 2019-07-16 ENCOUNTER — Telehealth: Payer: Self-pay | Admitting: Gastroenterology

## 2019-07-16 NOTE — Telephone Encounter (Signed)
PLEASE CALL PT. If she needs an EGD for dysphagia she will need to return to [facility]. They have the ability to inject BOTOX or to do other procedures that will help her. We have nothing left at [facility] that we can do for her achalasia.

## 2019-07-16 NOTE — Telephone Encounter (Signed)
noted 

## 2019-07-16 NOTE — Telephone Encounter (Signed)
Pt called to let us know that she has been seeing Dr Lorin Picket at [facility], but it's too far of a drive and arranging transportation to get there. She wants to start having her EGD done in [place]. [facility] is a [facility] and is aware that [facility] will be leaving in May. Pt is going to have her medical records from [facility] sent to [place].

## 2019-07-16 NOTE — Telephone Encounter (Signed)
Called pt and notified her that she is scheduled for a procure at [facility] and it is called an egd flip and that is not something our providers do here. She stated she understood and that if she is able to get there to have the procedure done she will

## 2019-07-17 ENCOUNTER — Emergency Department (HOSPITAL_COMMUNITY)
Admission: EM | Admit: 2019-07-17 | Discharge: 2019-07-19 | Disposition: A | Discharge status: Other Acute Inpatient Hospital | Payer: Medicare Other | Attending: Emergency Medicine | Admitting: Emergency Medicine

## 2019-07-17 ENCOUNTER — Other Ambulatory Visit: Payer: Self-pay

## 2019-07-17 ENCOUNTER — Emergency Department (HOSPITAL_COMMUNITY): Payer: Medicare Other

## 2019-07-17 ENCOUNTER — Encounter (HOSPITAL_COMMUNITY): Payer: Self-pay | Admitting: Emergency Medicine

## 2019-07-17 DIAGNOSIS — K220 Achalasia of cardia: Secondary | ICD-10-CM | POA: Diagnosis not present

## 2019-07-17 DIAGNOSIS — Z87891 Personal history of nicotine dependence: Secondary | ICD-10-CM | POA: Insufficient documentation

## 2019-07-17 DIAGNOSIS — G20 Parkinson's disease: Secondary | ICD-10-CM | POA: Diagnosis not present

## 2019-07-17 DIAGNOSIS — R1111 Vomiting without nausea: Secondary | ICD-10-CM | POA: Diagnosis not present

## 2019-07-17 DIAGNOSIS — E876 Hypokalemia: Secondary | ICD-10-CM

## 2019-07-17 DIAGNOSIS — R1310 Dysphagia, unspecified: Secondary | ICD-10-CM | POA: Insufficient documentation

## 2019-07-17 DIAGNOSIS — I10 Essential (primary) hypertension: Secondary | ICD-10-CM | POA: Diagnosis not present

## 2019-07-17 DIAGNOSIS — R110 Nausea: Secondary | ICD-10-CM | POA: Insufficient documentation

## 2019-07-17 DIAGNOSIS — I110 Hypertensive heart disease with heart failure: Secondary | ICD-10-CM | POA: Insufficient documentation

## 2019-07-17 DIAGNOSIS — E860 Dehydration: Secondary | ICD-10-CM | POA: Diagnosis not present

## 2019-07-17 DIAGNOSIS — I5032 Chronic diastolic (congestive) heart failure: Secondary | ICD-10-CM | POA: Diagnosis not present

## 2019-07-17 DIAGNOSIS — Z79899 Other long term (current) drug therapy: Secondary | ICD-10-CM | POA: Insufficient documentation

## 2019-07-17 DIAGNOSIS — E119 Type 2 diabetes mellitus without complications: Secondary | ICD-10-CM | POA: Insufficient documentation

## 2019-07-17 DIAGNOSIS — I959 Hypotension, unspecified: Secondary | ICD-10-CM | POA: Diagnosis not present

## 2019-07-17 DIAGNOSIS — Z20822 Contact with and (suspected) exposure to covid-19: Secondary | ICD-10-CM | POA: Insufficient documentation

## 2019-07-17 DIAGNOSIS — R112 Nausea with vomiting, unspecified: Secondary | ICD-10-CM | POA: Diagnosis not present

## 2019-07-17 DIAGNOSIS — N289 Disorder of kidney and ureter, unspecified: Secondary | ICD-10-CM | POA: Diagnosis not present

## 2019-07-17 LAB — COMPREHENSIVE METABOLIC PANEL
ALT: 19 U/L (ref 0–44)
AST: 29 U/L (ref 15–41)
Albumin: 3.6 g/dL (ref 3.5–5.0)
Alkaline Phosphatase: 69 U/L (ref 38–126)
Anion gap: 13 (ref 5–15)
BUN: 23 mg/dL (ref 8–23)
CO2: 27 mmol/L (ref 22–32)
Calcium: 8.8 mg/dL — ABNORMAL LOW (ref 8.9–10.3)
Chloride: 102 mmol/L (ref 98–111)
Creatinine, Ser: 1.22 mg/dL — ABNORMAL HIGH (ref 0.44–1.00)
GFR calc Af Amer: 52 mL/min — ABNORMAL LOW (ref >60)
GFR calc non Af Amer: 45 mL/min — ABNORMAL LOW (ref >60)
Glucose, Bld: 115 mg/dL — ABNORMAL HIGH (ref 70–99)
Potassium: 2.5 mmol/L — CL (ref 3.5–5.1)
Sodium: 142 mmol/L (ref 135–145)
Total Bilirubin: 1.3 mg/dL — ABNORMAL HIGH (ref 0.3–1.2)
Total Protein: 6.8 g/dL (ref 6.5–8.1)

## 2019-07-17 LAB — CBC WITH DIFFERENTIAL/PLATELET
Abs Immature Granulocytes: 0.03 10*3/uL (ref 0.00–0.07)
Basophils Absolute: 0 10*3/uL (ref 0.0–0.1)
Basophils Relative: 0 %
Eosinophils Absolute: 0 10*3/uL (ref 0.0–0.5)
Eosinophils Relative: 0 %
HCT: 43.3 % (ref 36.0–46.0)
Hemoglobin: 14.6 g/dL (ref 12.0–15.0)
Immature Granulocytes: 0 %
Lymphocytes Relative: 16 %
Lymphs Abs: 1.4 10*3/uL (ref 0.7–4.0)
MCH: 29.5 pg (ref 26.0–34.0)
MCHC: 33.7 g/dL (ref 30.0–36.0)
MCV: 87.5 fL (ref 80.0–100.0)
Monocytes Absolute: 0.6 10*3/uL (ref 0.1–1.0)
Monocytes Relative: 7 %
Neutro Abs: 6.8 10*3/uL (ref 1.7–7.7)
Neutrophils Relative %: 77 %
Platelets: 245 10*3/uL (ref 150–400)
RBC: 4.95 MIL/uL (ref 3.87–5.11)
RDW: 14.6 % (ref 11.5–15.5)
WBC: 8.9 10*3/uL (ref 4.0–10.5)
nRBC: 0 % (ref 0.0–0.2)

## 2019-07-17 LAB — MAGNESIUM: Magnesium: 1.9 mg/dL (ref 1.7–2.4)

## 2019-07-17 LAB — LIPASE, BLOOD: Lipase: 28 U/L (ref 11–51)

## 2019-07-17 MED ORDER — ASPIRIN EC 81 MG PO TBEC
81.0000 mg | DELAYED_RELEASE_TABLET | Freq: Every day | ORAL | Status: DC
  Administered 2019-07-18 – 2019-07-19 (×2): 81 mg via ORAL
  Filled 2019-07-17 (×2): qty 1

## 2019-07-17 MED ORDER — POTASSIUM CHLORIDE 10 MEQ/100ML IV SOLN
10.0000 meq | INTRAVENOUS | Status: AC
  Administered 2019-07-17 (×2): 10 meq via INTRAVENOUS
  Filled 2019-07-17 (×2): qty 100

## 2019-07-17 MED ORDER — SODIUM CHLORIDE 0.9 % IV BOLUS
1000.0000 mL | Freq: Once | INTRAVENOUS | Status: AC
  Administered 2019-07-17: 1000 mL via INTRAVENOUS

## 2019-07-17 MED ORDER — SERTRALINE HCL 50 MG PO TABS
100.0000 mg | ORAL_TABLET | Freq: Every day | ORAL | Status: DC
  Administered 2019-07-18 – 2019-07-19 (×2): 100 mg via ORAL
  Filled 2019-07-17 (×2): qty 2

## 2019-07-17 MED ORDER — NITROGLYCERIN 0.4 MG SL SUBL: 0.4000 mg | SUBLINGUAL_TABLET | SUBLINGUAL | Status: DC | PRN

## 2019-07-17 MED ORDER — MELOXICAM 7.5 MG PO TABS
7.5000 mg | ORAL_TABLET | Freq: Every day | ORAL | Status: DC
  Administered 2019-07-18 – 2019-07-19 (×2): 7.5 mg via ORAL
  Filled 2019-07-17 (×3): qty 1

## 2019-07-17 MED ORDER — ATORVASTATIN CALCIUM 40 MG PO TABS
40.0000 mg | ORAL_TABLET | Freq: Every day | ORAL | Status: DC
  Administered 2019-07-18 – 2019-07-19 (×2): 40 mg via ORAL
  Filled 2019-07-17 (×2): qty 1

## 2019-07-17 MED ORDER — FUROSEMIDE 40 MG PO TABS: 20.0000 mg | ORAL_TABLET | Freq: Every day | ORAL | Status: DC | PRN

## 2019-07-17 MED ORDER — GABAPENTIN 100 MG PO CAPS
100.0000 mg | ORAL_CAPSULE | Freq: Two times a day (BID) | ORAL | Status: DC
  Administered 2019-07-17 – 2019-07-19 (×4): 100 mg via ORAL
  Filled 2019-07-17 (×4): qty 1

## 2019-07-17 MED ORDER — PANTOPRAZOLE SODIUM 40 MG PO TBEC
40.0000 mg | DELAYED_RELEASE_TABLET | Freq: Two times a day (BID) | ORAL | Status: DC
  Administered 2019-07-17 – 2019-07-19 (×4): 40 mg via ORAL
  Filled 2019-07-17 (×4): qty 1

## 2019-07-17 MED ORDER — LISINOPRIL 10 MG PO TABS
10.0000 mg | ORAL_TABLET | Freq: Every day | ORAL | Status: DC
  Administered 2019-07-18 – 2019-07-19 (×2): 10 mg via ORAL
  Filled 2019-07-17 (×2): qty 1

## 2019-07-17 MED ORDER — PRAMIPEXOLE DIHYDROCHLORIDE 0.25 MG PO TABS
0.5000 mg | ORAL_TABLET | Freq: Every day | ORAL | Status: DC
  Administered 2019-07-18 (×2): 0.5 mg via ORAL
  Filled 2019-07-17 (×3): qty 2

## 2019-07-17 NOTE — ED Triage Notes (Addendum)
PT brought in from [facility] from her residence today for c/o nausea with vomiting x1 month. PT states she has had her esophagus stretchers x4 this year and was supposed to have an EGD done today at [facility] but didn't have transportation to get her Covid test prior to the procedure. PT states she sees Dr. Darrick Penna. PT states she was discharged from [facility] about 4 weeks ago but since then has had continued difficulty swallowing and has been unable to take her routine medication. PT c/o about 18 lbs weight loss this past month with generalized weakness.

## 2019-07-17 NOTE — ED Provider Notes (Signed)
[facility] EMERGENCY DEPARTMENT Provider Note   CSN: [id number] Arrival date & time: 07/17/19  1413     History Chief Complaint  Patient presents with  . Nausea    Allison Rollins is a 69 y.o. female with a pmh of copd, CHF, diabetes, hypertension and achalasia.  The patient underwent an esophageal myotomy at [facility] about 4 weeks ago and was discharged on a soft diet.  She states that since that time she has been unable to tolerate soft foods or liquids and that every time she tries to swallow she feels like they get stuck in her esophagus and she vomits.  She is also been unable to take her home medications.  She states that she is only getting a tiny amount into her stomach.  She has been feeling weak, tired and dehydrated.  The patient was supposed to have an EGD today at [facility] but was unable to find a ride to get her pre-Covid screening.  It appears that she has had phone conversations with her gastroenterologist Dr. Oneida Alar who is asked her to return to [facility] because there is nothing further that can be offered to her for her achalasia in this community.  The patient denies abdominal pain, fever chills or chest pain.  HPI     Past Medical History:  Diagnosis Date  . Anxiety   . Asthma   . Chest pain 12/22/2015  . CHF (congestive heart failure) ([place])   . COPD (chronic obstructive pulmonary disease) ([place])   . Depression   . Diabetes mellitus without complication ([place])   . Headache   . HTN (hypertension)   . Hypoglycemia   . Hypokalemia 07/28/2013  . Kidney stones   . Parkinson's disease   . Pneumonia   . Pre-diabetes   . S/P colonoscopy 10/15/04   normal    Patient Active Problem List   Diagnosis Date Noted  . Achalasia of esophagus   . Vitamin D deficiency 11/24/2018  . Restless leg 11/24/2018  . Erosive gastritis   . Type 2 diabetes mellitus with diabetic polyneuropathy, without long-term current use of insulin ([place]) 08/08/2018  . Seasonal allergic  rhinitis due to pollen 07/20/2018  . Osteopenia of forearm 03/04/2017  . Recurrent depression ([place]) 12/26/2015  . Generalized anxiety disorder 03/12/2015  . Prediabetes 03/12/2015  . Back pain 03/12/2015  . Stress incontinence 01/06/2015  . Chronic diastolic heart failure ([place]) 01/03/2015  . Morbid obesity ([place]) 07/01/2011  . Hypertension associated with diabetes ([place]) 07/01/2011  . Hyperlipidemia associated with type 2 diabetes mellitus ([place]) 07/01/2011  . Dysphagia 09/08/2010  . GERD (gastroesophageal reflux disease) 09/08/2010    Past Surgical History:  Procedure Laterality Date  . CHOLECYSTECTOMY    . COLONOSCOPY  10/15/2004   Normal rectum/Normal colon  . COLONOSCOPY N/A 05/10/2016   Procedure: COLONOSCOPY;  Surgeon: Danie Binder, MD;  Location: [facility];  Service: Endoscopy;  Laterality: N/A;  10:30  . ESOPHAGEAL MANOMETRY N/A 04/02/2019   Procedure: ESOPHAGEAL MANOMETRY (EM);  Surgeon: Mauri Pole, MD;  Location: [facility] ENDOSCOPY;  Service: Endoscopy;  Laterality: N/A;  . ESOPHAGOGASTRODUODENOSCOPY  09/11/2010   Dysphagia likely multifactorial (possible Candida esophagitis, likely nonspecific esophageal motility disorder, and/or uncontrolled gastroesophageal reflux disease), status post empiric dilation  . ESOPHAGOGASTRODUODENOSCOPY N/A 11/13/2018   Dr. Oneida Alar: No endoscopic esophageal abnormality to explain patient's dysphagia.  Esophagus dilated.  Moderate erosive/nodular gastritis with benign biopsies.  Marland Kitchen KIDNEY STONE SURGERY    . SAVORY DILATION  N/A 11/13/2018   Procedure: SAVORY DILATION;  Surgeon: Danie Binder, MD;  Location: [facility];  Service: Endoscopy;  Laterality: N/A;     OB History    Gravida      Para      Term      Preterm      AB      Living  2     SAB      TAB      Ectopic      Multiple      Live Births              Family History  Problem Relation Age of Onset  . Coronary artery disease Father   . COPD Mother    . Asthma Brother   . Coronary artery disease Sister   . Diabetes Sister   . Coronary artery disease Brother   . Coronary artery disease Sister     Social History   Tobacco Use  . Smoking status: Former Smoker    Packs/day: 0.25    Years: 30.00    Pack years: 7.50    Types: Cigarettes    Start date: 01/04/1965    Quit date: 01/03/1995    Years since quitting: 24.5  . Smokeless tobacco: Never Used  Substance Use Topics  . Alcohol use: No    Alcohol/week: 0.0 standard drinks  . Drug use: No    Home Medications Prior to Admission medications   Medication Sig Start Date End Date Taking? Authorizing Provider  acetaminophen (TYLENOL) 500 MG tablet Take 500-1,000 mg by mouth every 6 (six) hours as needed for moderate pain (pain.).     [provider]  albuterol (PROVENTIL HFA;VENTOLIN HFA) 108 (90 Base) MCG/ACT inhaler Inhale 2 puffs into the lungs every 6 (six) hours as needed for wheezing. 01/12/18   Baruch Gouty, FNP  albuterol (PROVENTIL) (2.5 MG/3ML) 0.083% nebulizer solution Take 3 mLs (2.5 mg total) by nebulization every 6 (six) hours as needed for wheezing or shortness of breath. 01/12/18   Baruch Gouty, FNP  Alcohol Swabs (B-D SINGLE USE SWABS REGULAR) PADS Test sugars daily 05/24/18   Baruch Gouty, FNP  aspirin EC 81 MG tablet Take 1 tablet (81 mg total) by mouth daily. 12/23/15   Kathie Dike, MD  atorvastatin (LIPITOR) 40 MG tablet Take 1 tablet by mouth every day 05/25/19   Baruch Gouty, FNP  blood glucose meter kit and supplies Accu-check Nano smartview meter 05/19/16   Eustaquio Maize, MD  Blood Glucose Monitoring Suppl (ACCU-CHEK AVIVA PLUS) w/Device KIT Test sugars daily 05/24/18   Baruch Gouty, FNP  cetirizine (ZYRTEC) 10 MG tablet Take 1 tablet by mouth every day 05/21/19   Baruch Gouty, FNP  cholecalciferol (VITAMIN D3) 25 MCG (1000 UT) tablet Take 1,000 Units by mouth 2 (two) times daily.     [provider]  fluticasone (FLONASE) 50 MCG/ACT  nasal spray Place 2 sprays into both nostrils daily. Patient taking differently: Place 2 sprays into both nostrils at bedtime.  07/20/18   Baruch Gouty, FNP  furosemide (LASIX) 20 MG tablet Take 1 tablet by mouth every day as needed 04/25/19   Baruch Gouty, FNP  furosemide (LASIX) 40 MG tablet TAKE 1 TABLET TWICE DAILY AS NEEDED FOR  SWELLING, FLUID Patient taking differently: Take 40 mg by mouth daily as needed (fluid retention.).  05/04/18   Eustaquio Maize, MD  gabapentin (NEURONTIN) 300 MG capsule  Take 1 capsule in morning & 2 caps every evening 06/25/19   Baruch Gouty, FNP  glucose blood (ACCU-CHEK AVIVA PLUS) test strip Test sugars daily 06/07/18   Baruch Gouty, FNP  HYDROcodone-acetaminophen (NORCO/VICODIN) 5-325 MG tablet Take 1 tablet by mouth 3 (three) times daily as needed. 05/23/19   [provider]  lisinopril (ZESTRIL) 10 MG tablet Take 1 tablet by mouth every day 06/25/19   Baruch Gouty, FNP  meloxicam [facility]) 7.5 MG tablet Take 1 tab by mouth every day as needed for pain 04/25/19   Baruch Gouty, FNP  Multiple Vitamin (MULTIVITAMIN WITH MINERALS) TABS tablet Take 1 tablet by mouth daily.    [provider]  nitroGLYCERIN (NITROSTAT) 0.4 MG SL tablet Place 1 tablet (0.4 mg total) under the tongue every 5 (five) minutes as needed for chest pain. 12/25/15   Eustaquio Maize, MD  nystatin cream (MYCOSTATIN) Apply 1 application topically twice daily 12/07/18   Rakes, Connye Burkitt, FNP  Omega-3 Fatty Acids (FISH OIL PO) Take 1 capsule by mouth daily.     [provider]  pantoprazole (PROTONIX) 40 MG tablet TAKE ONE TABLET BY MOUTH TWICE DAILY 03/19/19   Mahala Menghini, PA-C  potassium chloride SA (K-DUR) 20 MEQ tablet Take 20 mEq by mouth daily as needed (with lasix).    [provider]  pramipexole (MIRAPEX) 0.25 MG tablet Take 2 tablets by mouth at bedtime 05/25/19   Baruch Gouty, FNP  sertraline (ZOLOFT) 100 MG tablet Take 1 tablet by mouth every day  06/25/19   Baruch Gouty, FNP    Allergies    Penicillins and Sulfa antibiotics  Review of Systems   Review of Systems Ten systems reviewed and are negative for acute change, except as noted in the HPI.   Physical Exam Updated Vital Signs BP 111/65   Pulse (!) 58   Temp 98.3 F (36.8 C) (Oral)   Resp 15   Ht 5' 6"  (1.676 m)   Wt 100.7 kg   SpO2 95%   BMI 35.83 kg/m   Physical Exam Vitals and nursing note reviewed.  Constitutional:      General: She is not in acute distress.    Appearance: She is well-developed. She is not diaphoretic.  HENT:     Head: Normocephalic and atraumatic.  Eyes:     General: No scleral icterus.    Conjunctiva/sclera: Conjunctivae normal.  Cardiovascular:     Rate and Rhythm: Normal rate and regular rhythm.     Heart sounds: Normal heart sounds. No murmur. No friction rub. No gallop.   Pulmonary:     Effort: Pulmonary effort is normal. No respiratory distress.     Breath sounds: Normal breath sounds.  Abdominal:     General: Bowel sounds are normal. There is no distension.     Palpations: Abdomen is soft. There is no mass.     Tenderness: There is no abdominal tenderness. There is no guarding.  Musculoskeletal:     Cervical back: Normal range of motion.  Skin:    General: Skin is warm and dry.  Neurological:     Mental Status: She is alert and oriented to person, place, and time.  Psychiatric:        Behavior: Behavior normal.     ED Results / Procedures / Treatments   Labs (all labs ordered are listed, but only abnormal results are displayed) Labs Reviewed  CBC WITH DIFFERENTIAL/PLATELET  COMPREHENSIVE METABOLIC PANEL  LIPASE, BLOOD    EKG None  Radiology DG Chest 2 View  Result Date: 07/17/2019 CLINICAL DATA:  Nausea and vomiting. EXAM: CHEST - 2 VIEW COMPARISON:  None. FINDINGS: The lungs are hyperinflated. Mild linear atelectasis is seen within the bilateral lung bases. There is no evidence of a pleural effusion or  pneumothorax. The cardiac silhouette is mildly enlarged and unchanged in size. The visualized skeletal structures are unremarkable. IMPRESSION: No active cardiopulmonary disease. Electronically Signed   By: Virgina Norfolk M.D.   On: 07/17/2019 16:42    Procedures Procedures (including critical care time)  Medications Ordered in ED Medications  sodium chloride 0.9 % bolus 1,000 mL (1,000 mLs Intravenous New Bag/Given 07/17/19 1606)    ED Course  I have reviewed the triage vital signs and the nursing notes.  Pertinent labs & imaging results that were available during my care of the patient were reviewed by me and considered in my medical decision making (see chart for details).  Clinical Course as of Jul 17 1646  Tue Jul 17, 6346  [id number] 69 year old female here with 4 weeks of dysphagia and feeling like stuff is getting stuck in her midesophagus.  Sounds like she had some sort of procedure at [facility] and since then has had these symptoms.  No cough or shortness of breath.  Vitals unremarkable.  Will review records to see if we can figure out what type of procedure she had and go from there.   [MB]    Clinical Course User Index [MB] Hayden Rasmussen, MD   MDM Rules/Calculators/A&P                      69 year old female here with dysphagia and inability to tolerate solids or liquids.  Her work-up is pending and labs are in process.  Ultimately I feel that this patient will certainly need consult with her GI specialist at [facility] after some lab work has returned.  I discussed the case with PA Libert[place] who will assume care of the patient. Final Clinical Impression(s) / ED Diagnoses Final diagnoses:  None    Rx / DC Orders ED Discharge Orders    None       Kassondra, Geil, PA-C 07/17/19 1648    Nat Christen, MD 07/18/19 [phone]

## 2019-07-17 NOTE — ED Provider Notes (Addendum)
Received patient at signout from [facility].  Refer to provider note for full history and physical examination.  Briefly, patient is a 69 year old female with history of achalasia presenting for persistent regurgitation of food, vomiting for over 1 month.  She is status post endoscopic myotomy on 05/22/2019 at [facility] with Dr. Lorin Picket.  Since her procedure despite maintaining a soft and liquid diet she reports everything she attempts to consume comes back up.  This includes her home medications.  She states that she will sometimes be able to have a tiny amount of liquid occasionally.  She has previously been seen by Dr. Darrick Penna with gastroenterology and there is a telephone encounter today from her office which notes that there is nothing left to offer her here at [facility] and that she should follow up with [facility].  Pending lab work and will consult GI at [facility] for further recommendations. Physical Exam  BP 111/65   Pulse (!) 58   Temp 98.3 F (36.8 C) (Oral)   Resp 15   Ht 5\' 6"  (1.676 m)   Wt 100.7 kg   SpO2 95%   BMI 35.83 kg/m   Physical Exam Vitals and nursing note reviewed.  Constitutional:      General: She is not in acute distress.    Appearance: She is well-developed. She is obese.     Comments: Appears anxious, frequently tearful  HENT:     Head: Normocephalic and atraumatic.  Eyes:     General:        Right eye: No discharge.        Left eye: No discharge.     Conjunctiva/sclera: Conjunctivae normal.  Neck:     Vascular: No JVD.     Trachea: No tracheal deviation.  Cardiovascular:     Rate and Rhythm: Normal rate.  Pulmonary:     Effort: Pulmonary effort is normal.  Abdominal:     General: There is no distension.  Skin:    General: Skin is warm and dry.     Findings: No erythema.  Neurological:     Mental Status: She is alert.  Psychiatric:        Mood and Affect: Mood is anxious.     ED Course/Procedures   Clinical Course as of  Jul 16 1601  Tue Jul 17, 2019  [id number] 69 year old female here with 4 weeks of dysphagia and feeling like stuff is getting stuck in her midesophagus.  Sounds like she had some sort of procedure at [facility] and since then has had these symptoms.  No cough or shortness of breath.  Vitals unremarkable.  Will review records to see if we can figure out what type of procedure she had and go from there.   [MB]    Clinical Course User Index [MB] [facility], MD    Procedures  MDM    Labs Reviewed  COMPREHENSIVE METABOLIC PANEL - Abnormal; Notable for the following components:      Result Value   Potassium 2.5 (*)    Glucose, Bld 115 (*)    Creatinine, Ser 1.22 (*)    Calcium 8.8 (*)    Total Bilirubin 1.3 (*)    GFR calc non Af Amer 45 (*)    GFR calc Af Amer 52 (*)    All other components within normal limits  LIPASE, BLOOD  CBC WITH DIFFERENTIAL/PLATELET  MAGNESIUM   Lab work reviewed and interpreted by myself is concerning for hypokalemia with  potassium of 2.5, elevated creatinine of 1.22 and BUN is trending up as well though still within normal limits.  This is likely in the setting of her persistent vomiting and decreased oral intake.  Magnesium is within normal limits.  We will give IV potassium supplementation as she is not able to tolerate p.o. at this time.  6:01 PM CONSULT: Spoke with Dr. Toney Rakes with GI at [facility], plan for direct admission to [facility] advises that as the patient is asymptomatic from a Covid standpoint she does not require screening test.  Unfortunately they do not have any beds available for her at this time so she will remain here overnight until a bed opens up and she can be transferred there.  The patient was initially hesitant for admission but understands that this is likely the best option for her given our resources are limited for management of her condition here. RN Oswaldo Milian has also spoken with the  patient's son who is in agreement with transfer to [facility] as well.     Renita Papa, PA-C 07/18/19 0000    Nat Christen, MD 07/18/19 [phone]

## 2019-07-17 NOTE — ED Notes (Signed)
Spoke with [facility] at this time pt has been accepted by Dr. Lily Peer at this time but is on a wait list for bed placement Sarah RN from transfer center will call with an update later. Allison Rollins

## 2019-07-18 LAB — RESPIRATORY PANEL BY RT PCR (FLU A&B, COVID)
Influenza A by PCR: NEGATIVE
Influenza B by PCR: NEGATIVE
SARS Coronavirus 2 by RT PCR: NEGATIVE

## 2019-07-18 LAB — POTASSIUM
Potassium: 2.6 mmol/L — CL (ref 3.5–5.1)
Potassium: 3.1 mmol/L — ABNORMAL LOW (ref 3.5–5.1)

## 2019-07-18 LAB — CBG MONITORING, ED: Glucose-Capillary: 84 mg/dL (ref 70–99)

## 2019-07-18 MED ORDER — POTASSIUM CHLORIDE 10 MEQ/100ML IV SOLN
10.0000 meq | INTRAVENOUS | Status: AC
  Administered 2019-07-18 (×4): 10 meq via INTRAVENOUS
  Filled 2019-07-18 (×4): qty 100

## 2019-07-18 NOTE — ED Notes (Signed)
Pt vomited scant amount on gown. Pt given bed bath. Linens and gown changed. Pt comfortable at this time. Called [facility] for hospital bed for pt. Pt states she is bored. Pt given word search puzzles and coloring pages. Pt resting without complaints.

## 2019-07-18 NOTE — ED Notes (Signed)
Date and time results received: 07/18/19 1:42 AM  Test: Potassium Critical Value: 2.6  Name of Provider Notified: Dr. Preston Fleeting  Orders Received? Or Actions Taken?: Actions Taken: n/a

## 2019-07-18 NOTE — ED Notes (Signed)
Vomited up liquid breakfast.  Denies nausea.

## 2019-07-18 NOTE — ED Provider Notes (Signed)
After 40 mEq of intravenous potassium, potassium is still very low at 2.6.  She will be given additional intravenous potassium.   Dione Booze, MD 07/18/19 [phone]

## 2019-07-18 NOTE — ED Notes (Signed)
Pt able to tolerate full liquid diet for lunch. Pt states she has not vomited since 1230 last night.

## 2019-07-18 NOTE — ED Notes (Signed)
ED TO INPATIENT HANDOFF REPORT  ED Nurse Name and Phone #: [phone]  S Name/Age/Gender Allison Rollins 69 y.o. female Room/Bed: APA02/APA02  Code Status   Code Status: Prior  Home/SNF/Other Home Patient oriented to: self, place, time and situation Is this baseline? Yes   Triage Complete: Triage complete  Chief Complaint weakness/nausa/and vomiting  Triage Note PT brought in from [facility] from her residence today for c/o nausea with vomiting x1 month. PT states she has had her esophagus stretchers x4 this year and was supposed to have an EGD done today at [facility] but didn't have transportation to get her Covid test prior to the procedure. PT states she sees Dr. Oneida Alar. PT states she was discharged from [facility] about 4 weeks ago but since then has had continued difficulty swallowing and has been unable to take her routine medication. PT c/o about 18 lbs weight loss this past month with generalized weakness.     Allergies Allergies  Allergen Reactions  . Penicillins Itching and Rash    Has patient had a PCN reaction causing immediate rash, facial/tongue/throat swelling, SOB or lightheadedness with hypotension: no Has patient had a PCN reaction causing severe rash involving mucus membranes or skin necrosis: no Has patient had a PCN reaction that required hospitalization: no Has patient had a PCN reaction occurring within the last 10 years: no If all of the above answers are "NO", then may proceed with Cephalosporin use.   . Sulfa Antibiotics Rash    Level of Care/Admitting Diagnosis ED Disposition    ED Disposition Condition Comment   Transfer to Another Facility  The patient appears reasonably stabilized for transfer considering the current resources, flow, and capabilities available in the ED at this time, and I doubt any other [facility] requiring further screening and/or treatment in the ED prior to transfer is p resent.       B Medical/Surgery History Past Medical  History:  Diagnosis Date  . Anxiety   . Asthma   . Chest pain 12/22/2015  . CHF (congestive heart failure) ([place])   . COPD (chronic obstructive pulmonary disease) ([place])   . Depression   . Diabetes mellitus without complication ([place])   . Headache   . HTN (hypertension)   . Hypoglycemia   . Hypokalemia 07/28/2013  . Kidney stones   . Parkinson's disease   . Pneumonia   . Pre-diabetes   . S/P colonoscopy 10/15/04   normal   Past Surgical History:  Procedure Laterality Date  . CHOLECYSTECTOMY    . COLONOSCOPY  10/15/2004   Normal rectum/Normal colon  . COLONOSCOPY N/A 05/10/2016   Procedure: COLONOSCOPY;  Surgeon: Danie Binder, MD;  [facility];  Service: Endoscopy;  Laterality: N/A;  10:30  . ESOPHAGEAL MANOMETRY N/A 04/02/2019   Procedure: ESOPHAGEAL MANOMETRY (EM);  Surgeon: Mauri Pole, MD;  Location: [facility];  Service: Endoscopy;  Laterality: N/A;  . ESOPHAGOGASTRODUODENOSCOPY  09/11/2010   Dysphagia likely multifactorial (possible Candida esophagitis, likely nonspecific esophageal motility disorder, and/or uncontrolled gastroesophageal reflux disease), status post empiric dilation  . ESOPHAGOGASTRODUODENOSCOPY N/A 11/13/2018   Dr. Oneida Alar: No endoscopic esophageal abnormality to explain patient's dysphagia.  Esophagus dilated.  Moderate erosive/nodular gastritis with benign biopsies.  Marland Kitchen KIDNEY STONE SURGERY    . SAVORY DILATION N/A 11/13/2018   Procedure: SAVORY DILATION;  Surgeon: Danie Binder, MD;  Location[facility];  Service: Endoscopy;  Laterality: N/A;     A IV Location/Drains/Wounds Patient Lines/Drains/Airways Status  Active Line/Drains/Airways    Name:   Placement date:   Placement time:   Site:   Days:   Peripheral IV 07/17/19 Left Antecubital   07/17/19    1603    Antecubital   1   Wound 07/01/11 Other (Comment) Buttocks Right hald scabbed half open area at top portion of R buttock   07/01/11    1750    Buttocks   2939           Intake/Output Last 24 hours  Intake/Output Summary (Last 24 hours) at 07/18/2019 1642 Last data filed at 07/17/2019 1857 Gross per 24 hour  Intake 1100 ml  Output --  Net 1100 ml    Labs/Imaging Results for orders placed or performed during the hospital encounter of 07/17/19 (from the past 48 hour(s))  Comprehensive metabolic panel     Status: Abnormal   Collection Time: 07/17/19  3:55 PM  Result Value Ref Range   Sodium 142 135 - 145 mmol/L   Potassium 2.5 (LL) 3.5 - 5.1 mmol/L    Comment: CRITICAL RESULT CALLED TO, READ BACK BY AND VERIFIED WITH: MINTER,R ON 07/17/19 AT 1655 BY LOY,C    Chloride 102 98 - 111 mmol/L   CO2 27 22 - 32 mmol/L   Glucose, Bld 115 (H) 70 - 99 mg/dL    Comment: Glucose reference range applies only to samples taken after fasting for at least 8 hours.   BUN 23 8 - 23 mg/dL   Creatinine, Ser 6.29 (H) 0.44 - 1.00 mg/dL   Calcium 8.8 (L) 8.9 - 10.3 mg/dL   Total Protein 6.8 6.5 - 8.1 g/dL   Albumin 3.6 3.5 - 5.0 g/dL   AST 29 15 - 41 U/L   ALT 19 0 - 44 U/L   Alkaline Phosphatase 69 38 - 126 U/L   Total Bilirubin 1.3 (H) 0.3 - 1.2 mg/dL   GFR calc non Af Amer 45 (L) >60 mL/min   GFR calc Af Amer 52 (L) >60 mL/min   Anion gap 13 5 - 15    Comment: Performed at [facility], [address], [place], [place] [address]  Lipase, blood     Status: None   Collection Time: 07/17/19  3:55 PM  Result Value Ref Range   Lipase 28 11 - 51 U/L    Comment: Performed at [facility], [address] [place], [place] [address]  CBC with Differential     Status: None   Collection Time: 07/17/19  3:55 PM  Result Value Ref Range   WBC 8.9 4.0 - 10.5 K/uL   RBC 4.95 3.87 - 5.11 MIL/uL   Hemoglobin 14.6 12.0 - 15.0 g/dL   HCT 46.5 68.1 - 27.5 %   MCV 87.5 80.0 - 100.0 fL   MCH 29.5 26.0 - 34.0 pg   MCHC 33.7 30.0 - 36.0 g/dL   RDW 17.0 01.7 - 49.4 %   Platelets 245 150 - 400 K/uL   nRBC 0.0 0.0 - 0.2 %   Neutrophils Relative % 77 %   Neutro Abs 6.8 1.7 - 7.7  K/uL   Lymphocytes Relative 16 %   Lymphs Abs 1.4 0.7 - 4.0 K/uL   Monocytes Relative 7 %   Monocytes Absolute 0.6 0.1 - 1.0 K/uL   Eosinophils Relative 0 %   Eosinophils Absolute 0.0 0.0 - 0.5 K/uL   Basophils Relative 0 %   Basophils Absolute 0.0 0.0 - 0.1 K/uL   Immature Granulocytes 0 %  Abs Immature Granulocytes 0.03 0.00 - 0.07 K/uL    Comment: Performed at [facility], [address], [place], [place] [address]  Magnesium     Status: None   Collection Time: 07/17/19  3:55 PM  Result Value Ref Range   Magnesium 1.9 1.7 - 2.4 mg/dL    Comment: Performed at [facility], [address], [place], [place] [address]  Potassium     Status: Abnormal   Collection Time: 07/18/19  1:02 AM  Result Value Ref Range   Potassium 2.6 (LL) 3.5 - 5.1 mmol/L    Comment: CRITICAL RESULT CALLED TO, READ BACK BY AND VERIFIED WITH: Joelyn Oms @0139  07/18/19 [facility] Performed at [facility], [address], [place], [place]   CBG monitoring, ED     Status: None   Collection Time: 07/18/19  6:35 AM  Result Value Ref Range   Glucose-Capillary 84 70 - 99 mg/dL    Comment: Glucose reference range applies only to samples taken after fasting for at least 8 hours.  Potassium     Status: Abnormal   Collection Time: 07/18/19 11:55 AM  Result Value Ref Range   Potassium 3.1 (L) 3.5 - 5.1 mmol/L    Comment: Performed at [facility], [address], [place], [place]   DG Chest 2 View  Result Date: 07/17/2019 CLINICAL DATA:  Nausea and vomiting. EXAM: CHEST - 2 VIEW COMPARISON:  None. FINDINGS: The lungs are hyperinflated. Mild linear atelectasis is seen within the bilateral lung bases. There is no evidence of a pleural effusion or pneumothorax. The cardiac silhouette is mildly enlarged and unchanged in size. The visualized skeletal structures are unremarkable. IMPRESSION: No active cardiopulmonary disease. Electronically Signed   By: 07/19/2019 M.D.   On: 07/17/2019 16:42     Pending Labs Unresulted Labs (From admission, onward)   None      Vitals/Pain Today's Vitals   07/18/19 1500 07/18/19 1530 07/18/19 1531 07/18/19 1600  BP: (!) 126/56 140/90  124/70  Pulse:   (!) 59 (!) 53  Resp:   15 (!) 22  Temp:      TempSrc:      SpO2:   97%   Weight:      Height:      PainSc:        Isolation Precautions No active isolations  Medications Medications  aspirin EC tablet 81 mg (81 mg Oral Given 07/18/19 0918)  atorvastatin (LIPITOR) tablet 40 mg (40 mg Oral Given 07/18/19 0918)  furosemide (LASIX) tablet 20 mg (has no administration in time range)  nitroGLYCERIN (NITROSTAT) SL tablet 0.4 mg (has no administration in time range)  meloxicam (MOBIC) tablet 7.5 mg (7.5 mg Oral Given 07/18/19 0924)  lisinopril (ZESTRIL) tablet 10 mg (10 mg Oral Given 07/18/19 0918)  gabapentin (NEURONTIN) capsule 100 mg (100 mg Oral Given 07/18/19 0918)  pantoprazole (PROTONIX) EC tablet 40 mg (40 mg Oral Given 07/18/19 0918)  pramipexole (MIRAPEX) tablet 0.5 mg (0.5 mg Oral Given 07/18/19 0032)  sertraline (ZOLOFT) tablet 100 mg (100 mg Oral Given 07/18/19 0918)  sodium chloride 0.9 % bolus 1,000 mL (0 mLs Intravenous Stopped 07/17/19 1738)  potassium chloride 10 mEq in 100 mL IVPB (0 mEq Intravenous Stopped 07/17/19 2040)  potassium chloride 10 mEq in 100 mL IVPB (0 mEq Intravenous Stopped 07/18/19 0032)  potassium chloride 10 mEq in 100 mL IVPB (0 mEq Intravenous Stopped 07/18/19 0536)    Mobility walks High fall risk   Focused Assessments  R Recommendations: See Admitting Provider Note  Report given to:   Additional Notes:

## 2019-07-19 DIAGNOSIS — R112 Nausea with vomiting, unspecified: Secondary | ICD-10-CM | POA: Diagnosis not present

## 2019-07-19 DIAGNOSIS — Z9889 Other specified postprocedural states: Secondary | ICD-10-CM | POA: Diagnosis not present

## 2019-07-19 DIAGNOSIS — E860 Dehydration: Secondary | ICD-10-CM | POA: Diagnosis not present

## 2019-07-19 DIAGNOSIS — E876 Hypokalemia: Secondary | ICD-10-CM | POA: Diagnosis not present

## 2019-07-19 NOTE — ED Notes (Signed)
RN flushed Pts IV to maintain patency. Pt adjusted and pulled up in bed. Pt resting comfortably at this time.

## 2019-07-19 NOTE — Progress Notes (Signed)
Patient changed and new purewick placed.

## 2019-07-19 NOTE — Progress Notes (Signed)
Patient given her full liquid tray.  Tolerated well.

## 2019-07-19 NOTE — ED Notes (Signed)
[facility] called to check in concerning bed for this patient. States they may have some opens beds later in the morning.

## 2019-07-19 NOTE — ED Notes (Signed)
Pt resting comfortably at this time. Pt provided portable phone to call family.

## 2019-07-19 NOTE — Progress Notes (Signed)
Patient took medications whole with sips of water.

## 2019-07-20 DIAGNOSIS — Z9889 Other specified postprocedural states: Secondary | ICD-10-CM | POA: Diagnosis not present

## 2019-07-20 DIAGNOSIS — E860 Dehydration: Secondary | ICD-10-CM | POA: Diagnosis not present

## 2019-07-20 DIAGNOSIS — E876 Hypokalemia: Secondary | ICD-10-CM | POA: Diagnosis not present

## 2019-07-25 DIAGNOSIS — Z20822 Contact with and (suspected) exposure to covid-19: Secondary | ICD-10-CM | POA: Diagnosis not present

## 2019-07-25 DIAGNOSIS — K220 Achalasia of cardia: Secondary | ICD-10-CM | POA: Diagnosis not present

## 2019-07-26 ENCOUNTER — Encounter: Payer: Self-pay | Admitting: *Deleted

## 2019-07-26 ENCOUNTER — Telehealth (INDEPENDENT_AMBULATORY_CARE_PROVIDER_SITE_OTHER): Payer: Medicare Other | Admitting: Family Medicine

## 2019-07-26 ENCOUNTER — Encounter: Payer: Self-pay | Admitting: Family Medicine

## 2019-07-26 ENCOUNTER — Ambulatory Visit (INDEPENDENT_AMBULATORY_CARE_PROVIDER_SITE_OTHER): Payer: Medicare Other | Admitting: Family Medicine

## 2019-07-26 DIAGNOSIS — Z5329 Procedure and treatment not carried out because of patient's decision for other reasons: Secondary | ICD-10-CM

## 2019-07-26 DIAGNOSIS — R1310 Dysphagia, unspecified: Secondary | ICD-10-CM

## 2019-07-26 DIAGNOSIS — Z91199 Patient's noncompliance with other medical treatment and regimen due to unspecified reason: Secondary | ICD-10-CM

## 2019-07-26 NOTE — Progress Notes (Signed)
Virtual Visit via Telephone Note  I connected with Allison Rollins on 07/26/19 at 3:28 PM by telephone and verified that I am speaking with the correct person using two identifiers. Allison Rollins is currently located at home and nobody is currently with her during this visit. The provider, Loman Brooklyn, FNP is located in their home at time of visit.  I discussed the limitations, risks, security and privacy concerns of performing an evaluation and management service by telephone and the availability of in person appointments. I also discussed with the patient that there may be a patient responsible charge related to this service. The patient expressed understanding and agreed to proceed.  Subjective: PCP: Loman Brooklyn, FNP  Chief Complaint  Patient presents with  . Panic Attack   Patient reports she has been dealing with nausea and vomiting for some time now.  She was hospitalized at [facility] from Tuesday to Friday of last week at which point she was transferred to [facility].  She reports she had a procedure yesterday to stretch something in her throat.  She is concerned today because she is still not able to tolerate solid foods.  Today she has drank a lot of water and Ensure.  She has also had mashed potatoes, Jell-O, and a milkshake.   ROS: Per HPI  Current Outpatient Medications:  .  acetaminophen (TYLENOL) 500 MG tablet, Take 500-1,000 mg by mouth every 6 (six) hours as needed for moderate pain (pain.). , Disp: , Rfl:  .  albuterol (PROVENTIL HFA;VENTOLIN HFA) 108 (90 Base) MCG/ACT inhaler, Inhale 2 puffs into the lungs every 6 (six) hours as needed for wheezing., Disp: 1 Inhaler, Rfl: 0 .  albuterol (PROVENTIL) (2.5 MG/3ML) 0.083% nebulizer solution, Take 3 mLs (2.5 mg total) by nebulization every 6 (six) hours as needed for wheezing or shortness of breath., Disp: 150 mL, Rfl: 1 .  Alcohol Swabs (B-D SINGLE USE SWABS REGULAR) PADS, Test sugars daily, Disp: 100 each, Rfl: 3 .   aspirin EC 81 MG tablet, Take 1 tablet (81 mg total) by mouth daily., Disp: , Rfl:  .  atorvastatin (LIPITOR) 40 MG tablet, Take 1 tablet by mouth every day, Disp: 30 tablet, Rfl: 2 .  blood glucose meter kit and supplies, Accu-check Nano smartview meter, Disp: 1 each, Rfl: 0 .  Blood Glucose Monitoring Suppl (ACCU-CHEK AVIVA PLUS) w/Device KIT, Test sugars daily, Disp: 1 kit, Rfl: 0 .  cetirizine (ZYRTEC) 10 MG tablet, Take 1 tablet by mouth every day, Disp: 30 tablet, Rfl: 5 .  cholecalciferol (VITAMIN D3) 25 MCG (1000 UT) tablet, Take 1,000 Units by mouth 2 (two) times daily. , Disp: , Rfl:  .  fluticasone (FLONASE) 50 MCG/ACT nasal spray, Place 2 sprays into both nostrils daily. (Patient taking differently: Place 2 sprays into both nostrils at bedtime. ), Disp: 16 g, Rfl: 6 .  furosemide (LASIX) 20 MG tablet, Take 1 tablet by mouth every day as needed, Disp: 30 tablet, Rfl: 1 .  gabapentin (NEURONTIN) 300 MG capsule, Take 1 capsule in morning & 2 caps every evening, Disp: 90 capsule, Rfl: 5 .  glucose blood (ACCU-CHEK AVIVA PLUS) test strip, Test sugars daily, Disp: 100 each, Rfl: 3 .  HYDROcodone-acetaminophen (NORCO/VICODIN) 5-325 MG tablet, Take 1 tablet by mouth 3 (three) times daily as needed., Disp: , Rfl:  .  lisinopril (ZESTRIL) 10 MG tablet, Take 1 tablet by mouth every day, Disp: 30 tablet, Rfl: 11 .  meloxicam (MOBIC) 7.5  MG tablet, Take 1 tab by mouth every day as needed for pain (Patient taking differently: Take 7.5 mg by mouth daily. ), Disp: 30 tablet, Rfl: 1 .  Multiple Vitamin (MULTIVITAMIN WITH MINERALS) TABS tablet, Take 1 tablet by mouth daily., Disp: , Rfl:  .  nitroGLYCERIN (NITROSTAT) 0.4 MG SL tablet, Place 1 tablet (0.4 mg total) under the tongue every 5 (five) minutes as needed for chest pain., Disp: 20 tablet, Rfl: 3 .  nystatin cream (MYCOSTATIN), Apply 1 application topically twice daily, Disp: 30 g, Rfl: 11 .  Omega-3 Fatty Acids (FISH OIL PO), Take 1 capsule by  mouth daily. , Disp: , Rfl:  .  ondansetron (ZOFRAN) 4 MG tablet, Take by mouth., Disp: , Rfl:  .  pantoprazole (PROTONIX) 40 MG tablet, TAKE ONE TABLET BY MOUTH TWICE DAILY, Disp: 60 tablet, Rfl: 11 .  potassium chloride SA (K-DUR) 20 MEQ tablet, Take 20 mEq by mouth daily as needed (with lasix)., Disp: , Rfl:  .  pramipexole (MIRAPEX) 0.25 MG tablet, Take 2 tablets by mouth at bedtime, Disp: 60 tablet, Rfl: 2 .  sertraline (ZOLOFT) 100 MG tablet, Take 1 tablet by mouth every day, Disp: 30 tablet, Rfl: 5  Allergies  Allergen Reactions  . Penicillins Itching and Rash    Has patient had a PCN reaction causing immediate rash, facial/tongue/throat swelling, SOB or lightheadedness with hypotension: no Has patient had a PCN reaction causing severe rash involving mucus membranes or skin necrosis: no Has patient had a PCN reaction that required hospitalization: no Has patient had a PCN reaction occurring within the last 10 years: no If all of the above answers are "NO", then may proceed with Cephalosporin use.   . Sulfa Antibiotics Rash   Past Medical History:  Diagnosis Date  . Anxiety   . Asthma   . Chest pain 12/22/2015  . CHF (congestive heart failure) ([place])   . COPD (chronic obstructive pulmonary disease) ([place])   . Depression   . Diabetes mellitus without complication ([place])   . Headache   . HTN (hypertension)   . Hypoglycemia   . Hypokalemia 07/28/2013  . Kidney stones   . Parkinson's disease   . Pneumonia   . Pre-diabetes   . S/P colonoscopy 10/15/04   normal    Observations/Objective: A&O  No respiratory distress or wheezing audible over the phone Mood, judgement, and thought processes all WNL  Assessment and Plan: 1. Dysphagia, unspecified type - Advised patient to call gastroenterologist that performed procedure yesterday. Discussed soft foods she could try to eat. Encouraged adequate hydration.    Follow Up Instructions:  I discussed the assessment and treatment plan  with the patient. The patient was provided an opportunity to ask questions and all were answered. The patient agreed with the plan and demonstrated an understanding of the instructions.   The patient was advised to call back or seek an in-person evaluation if the symptoms worsen or if the condition fails to improve as anticipated.  The above assessment and management plan was discussed with the patient. The patient verbalized understanding of and has agreed to the management plan. Patient is aware to call the clinic if symptoms persist or worsen. Patient is aware when to return to the clinic for a follow-up visit. Patient educated on when it is appropriate to go to the emergency department.   Time call ended: 3:38 PM  I provided 12 minutes of non-face-to-face time during this encounter.  [name], MSN, APRN, FNP-C Tristan Schroeder  [place] 07/26/19

## 2019-07-26 NOTE — Progress Notes (Signed)
Attempted to connect with pt three times via MyChart Video. Sent link to visit 3 times with no answer. No show for visit.  Pt will need to reschedule visit.    Kari Baars, FNP-C [facility] [address] [place], [place] [address] [phone]

## 2019-08-06 ENCOUNTER — Telehealth: Payer: Self-pay | Admitting: Family Medicine

## 2019-08-06 ENCOUNTER — Other Ambulatory Visit: Payer: Self-pay | Admitting: Family Medicine

## 2019-08-06 NOTE — Telephone Encounter (Signed)
No answer at number

## 2019-08-08 DIAGNOSIS — K222 Esophageal obstruction: Secondary | ICD-10-CM | POA: Diagnosis not present

## 2019-08-08 DIAGNOSIS — Z751 Person awaiting admission to adequate facility elsewhere: Secondary | ICD-10-CM | POA: Diagnosis not present

## 2019-08-08 DIAGNOSIS — E43 Unspecified severe protein-calorie malnutrition: Secondary | ICD-10-CM | POA: Diagnosis not present

## 2019-08-08 DIAGNOSIS — K220 Achalasia of cardia: Secondary | ICD-10-CM | POA: Diagnosis not present

## 2019-08-08 DIAGNOSIS — Z4659 Encounter for fitting and adjustment of other gastrointestinal appliance and device: Secondary | ICD-10-CM | POA: Diagnosis not present

## 2019-08-08 DIAGNOSIS — K219 Gastro-esophageal reflux disease without esophagitis: Secondary | ICD-10-CM | POA: Diagnosis not present

## 2019-08-08 DIAGNOSIS — I110 Hypertensive heart disease with heart failure: Secondary | ICD-10-CM | POA: Diagnosis not present

## 2019-08-08 DIAGNOSIS — E870 Hyperosmolality and hypernatremia: Secondary | ICD-10-CM | POA: Diagnosis not present

## 2019-08-08 DIAGNOSIS — Z20822 Contact with and (suspected) exposure to covid-19: Secondary | ICD-10-CM | POA: Diagnosis not present

## 2019-08-08 DIAGNOSIS — R634 Abnormal weight loss: Secondary | ICD-10-CM | POA: Diagnosis not present

## 2019-08-08 DIAGNOSIS — I10 Essential (primary) hypertension: Secondary | ICD-10-CM | POA: Diagnosis not present

## 2019-08-08 DIAGNOSIS — E876 Hypokalemia: Secondary | ICD-10-CM | POA: Diagnosis not present

## 2019-08-08 DIAGNOSIS — M6281 Muscle weakness (generalized): Secondary | ICD-10-CM | POA: Diagnosis not present

## 2019-08-08 DIAGNOSIS — J449 Chronic obstructive pulmonary disease, unspecified: Secondary | ICD-10-CM | POA: Diagnosis not present

## 2019-08-08 DIAGNOSIS — Z7982 Long term (current) use of aspirin: Secondary | ICD-10-CM | POA: Diagnosis not present

## 2019-08-08 DIAGNOSIS — E860 Dehydration: Secondary | ICD-10-CM | POA: Diagnosis not present

## 2019-08-08 DIAGNOSIS — R2689 Other abnormalities of gait and mobility: Secondary | ICD-10-CM | POA: Diagnosis not present

## 2019-08-08 DIAGNOSIS — I509 Heart failure, unspecified: Secondary | ICD-10-CM | POA: Diagnosis not present

## 2019-08-10 MED ORDER — NYSTATIN 100000 UNIT/GM EX POWD
CUTANEOUS | Status: DC
Start: 2019-08-21 — End: 2019-08-10

## 2019-08-10 MED ORDER — PHENOL 1.4 % MT LIQD
1.00 | OROMUCOSAL | Status: DC
Start: ? — End: 2019-08-10

## 2019-08-10 MED ORDER — POTASSIUM CHLORIDE 20 MEQ PO PACK
40.00 | PACK | ORAL | Status: DC
Start: 2019-08-10 — End: 2019-08-10

## 2019-08-10 MED ORDER — GENERIC EXTERNAL MEDICATION
Status: DC
Start: ? — End: 2019-08-10

## 2019-08-10 MED ORDER — ENOXAPARIN SODIUM 40 MG/0.4ML ~~LOC~~ SOLN
40.00 | SUBCUTANEOUS | Status: DC
Start: 2019-08-11 — End: 2019-08-10

## 2019-08-10 MED ORDER — PRO-STAT PO LIQD
ORAL | Status: DC
Start: 2019-08-14 — End: 2019-08-10

## 2019-08-10 MED ORDER — THIAMINE HCL 100 MG/ML IJ SOLN
100.00 | INTRAMUSCULAR | Status: DC
Start: 2019-08-13 — End: 2019-08-10

## 2019-08-10 MED ORDER — ONDANSETRON HCL 4 MG/2ML IJ SOLN
4.00 | INTRAMUSCULAR | Status: DC
Start: ? — End: 2019-08-10

## 2019-08-10 MED ORDER — LACTATED RINGERS IV SOLN
INTRAVENOUS | Status: DC
Start: ? — End: 2019-08-10

## 2019-08-10 NOTE — Telephone Encounter (Signed)
No answer, no voicemail.

## 2019-08-13 ENCOUNTER — Other Ambulatory Visit: Payer: Self-pay | Admitting: Family Medicine

## 2019-08-13 MED ORDER — PANTOPRAZOLE SODIUM 40 MG IV SOLR
40.00 | INTRAVENOUS | Status: DC
Start: 2019-08-14 — End: 2019-08-13

## 2019-08-13 MED ORDER — FLUTICASONE PROPIONATE 50 MCG/ACT NA SUSP
2.00 | NASAL | Status: DC
Start: 2019-08-22 — End: 2019-08-13

## 2019-08-13 MED ORDER — ENOXAPARIN SODIUM 40 MG/0.4ML ~~LOC~~ SOLN
40.00 | SUBCUTANEOUS | Status: DC
Start: 2019-08-21 — End: 2019-08-13

## 2019-08-13 MED ORDER — INTRON A 6000000 UNIT/ML IJ SOLN
1.00 | INTRAMUSCULAR | Status: DC
Start: ? — End: 2019-08-13

## 2019-08-13 MED ORDER — METRISET BURETTE SET MISC
Status: DC
Start: 2019-08-14 — End: 2019-08-13

## 2019-08-14 NOTE — Telephone Encounter (Signed)
LMTCB. Patient had a phone call in earlier and never returned our call patient stated she could not get the pill dawn and was wanting to switch it to solution.

## 2019-08-14 NOTE — Telephone Encounter (Signed)
I do not see this either. I see where she was recently in the hospital. Is this something they changed? If so, they should have sent a prescription either home with her or to her pharmacy. Is she out of the hospital? I am unable to see a discharge summary.

## 2019-08-14 NOTE — Telephone Encounter (Signed)
No return call this encounter will be closed

## 2019-08-21 DIAGNOSIS — I509 Heart failure, unspecified: Secondary | ICD-10-CM | POA: Diagnosis not present

## 2019-08-21 DIAGNOSIS — I10 Essential (primary) hypertension: Secondary | ICD-10-CM | POA: Diagnosis not present

## 2019-08-21 DIAGNOSIS — G6289 Other specified polyneuropathies: Secondary | ICD-10-CM | POA: Diagnosis not present

## 2019-08-21 DIAGNOSIS — M6281 Muscle weakness (generalized): Secondary | ICD-10-CM | POA: Diagnosis not present

## 2019-08-21 DIAGNOSIS — M170 Bilateral primary osteoarthritis of knee: Secondary | ICD-10-CM | POA: Diagnosis not present

## 2019-08-21 DIAGNOSIS — R2689 Other abnormalities of gait and mobility: Secondary | ICD-10-CM | POA: Diagnosis not present

## 2019-08-21 DIAGNOSIS — E7849 Other hyperlipidemia: Secondary | ICD-10-CM | POA: Diagnosis not present

## 2019-08-21 DIAGNOSIS — R634 Abnormal weight loss: Secondary | ICD-10-CM | POA: Diagnosis not present

## 2019-08-21 DIAGNOSIS — K220 Achalasia of cardia: Secondary | ICD-10-CM | POA: Diagnosis not present

## 2019-08-21 DIAGNOSIS — I110 Hypertensive heart disease with heart failure: Secondary | ICD-10-CM | POA: Diagnosis not present

## 2019-08-22 DIAGNOSIS — I10 Essential (primary) hypertension: Secondary | ICD-10-CM | POA: Diagnosis not present

## 2019-08-22 DIAGNOSIS — G6289 Other specified polyneuropathies: Secondary | ICD-10-CM | POA: Diagnosis not present

## 2019-08-22 DIAGNOSIS — K220 Achalasia of cardia: Secondary | ICD-10-CM | POA: Diagnosis not present

## 2019-08-22 DIAGNOSIS — E7849 Other hyperlipidemia: Secondary | ICD-10-CM | POA: Diagnosis not present

## 2019-08-22 MED ORDER — MELATONIN 3 MG PO TABS
6.00 | ORAL_TABLET | ORAL | Status: DC
Start: 2019-08-21 — End: 2019-08-22

## 2019-08-22 MED ORDER — OXYCODONE HCL 5 MG PO TABS
5.00 | ORAL_TABLET | ORAL | Status: DC
Start: ? — End: 2019-08-22

## 2019-08-22 MED ORDER — GABAPENTIN 300 MG PO CAPS
300.00 | ORAL_CAPSULE | ORAL | Status: DC
Start: 2019-08-21 — End: 2019-08-22

## 2019-08-22 MED ORDER — PRAMIPEXOLE DIHYDROCHLORIDE 0.25 MG PO TABS
0.50 | ORAL_TABLET | ORAL | Status: DC
Start: 2019-08-21 — End: 2019-08-22

## 2019-08-22 MED ORDER — CETIRIZINE HCL 10 MG PO TABS
10.00 | ORAL_TABLET | ORAL | Status: DC
Start: 2019-08-22 — End: 2019-08-22

## 2019-08-22 MED ORDER — ATORVASTATIN CALCIUM 40 MG PO TABS
40.00 | ORAL_TABLET | ORAL | Status: DC
Start: 2019-08-22 — End: 2019-08-22

## 2019-08-22 MED ORDER — PANTOPRAZOLE SODIUM 40 MG PO TBEC
40.00 | DELAYED_RELEASE_TABLET | ORAL | Status: DC
Start: 2019-08-22 — End: 2019-08-22

## 2019-08-22 MED ORDER — ACETAMINOPHEN 500 MG PO TABS
1000.00 | ORAL_TABLET | ORAL | Status: DC
Start: 2019-08-21 — End: 2019-08-22

## 2019-08-22 MED ORDER — ASPIRIN 81 MG PO TBEC
81.00 | DELAYED_RELEASE_TABLET | ORAL | Status: DC
Start: 2019-08-22 — End: 2019-08-22

## 2019-08-22 MED ORDER — SERTRALINE HCL 100 MG PO TABS
100.00 | ORAL_TABLET | ORAL | Status: DC
Start: 2019-08-22 — End: 2019-08-22

## 2019-08-23 ENCOUNTER — Other Ambulatory Visit: Payer: Self-pay | Admitting: *Deleted

## 2019-08-23 DIAGNOSIS — G2581 Restless legs syndrome: Secondary | ICD-10-CM

## 2019-08-23 DIAGNOSIS — E785 Hyperlipidemia, unspecified: Secondary | ICD-10-CM

## 2019-08-23 DIAGNOSIS — E1169 Type 2 diabetes mellitus with other specified complication: Secondary | ICD-10-CM

## 2019-08-23 MED ORDER — PRAMIPEXOLE DIHYDROCHLORIDE 0.25 MG PO TABS: 0.5000 mg | ORAL_TABLET | Freq: Every day | ORAL | 0 refills | Status: DC

## 2019-08-23 MED ORDER — ATORVASTATIN CALCIUM 40 MG PO TABS: 40.0000 mg | ORAL_TABLET | Freq: Every day | ORAL | 3 refills | Status: DC

## 2019-09-01 DIAGNOSIS — I509 Heart failure, unspecified: Secondary | ICD-10-CM | POA: Diagnosis not present

## 2019-09-01 DIAGNOSIS — R634 Abnormal weight loss: Secondary | ICD-10-CM | POA: Diagnosis not present

## 2019-09-01 DIAGNOSIS — K220 Achalasia of cardia: Secondary | ICD-10-CM | POA: Diagnosis not present

## 2019-09-01 DIAGNOSIS — I110 Hypertensive heart disease with heart failure: Secondary | ICD-10-CM | POA: Diagnosis not present

## 2019-09-01 DIAGNOSIS — Z7982 Long term (current) use of aspirin: Secondary | ICD-10-CM | POA: Diagnosis not present

## 2019-09-01 DIAGNOSIS — K219 Gastro-esophageal reflux disease without esophagitis: Secondary | ICD-10-CM | POA: Diagnosis not present

## 2019-09-01 DIAGNOSIS — G2581 Restless legs syndrome: Secondary | ICD-10-CM | POA: Diagnosis not present

## 2019-09-01 DIAGNOSIS — Z87891 Personal history of nicotine dependence: Secondary | ICD-10-CM | POA: Diagnosis not present

## 2019-09-01 DIAGNOSIS — E785 Hyperlipidemia, unspecified: Secondary | ICD-10-CM | POA: Diagnosis not present

## 2019-09-01 DIAGNOSIS — M170 Bilateral primary osteoarthritis of knee: Secondary | ICD-10-CM | POA: Diagnosis not present

## 2019-09-01 DIAGNOSIS — G629 Polyneuropathy, unspecified: Secondary | ICD-10-CM | POA: Diagnosis not present

## 2019-09-01 DIAGNOSIS — Z48815 Encounter for surgical aftercare following surgery on the digestive system: Secondary | ICD-10-CM | POA: Diagnosis not present

## 2019-09-05 ENCOUNTER — Ambulatory Visit: Payer: Medicare Other | Admitting: Family Medicine

## 2019-09-06 DIAGNOSIS — E785 Hyperlipidemia, unspecified: Secondary | ICD-10-CM | POA: Diagnosis not present

## 2019-09-06 DIAGNOSIS — I509 Heart failure, unspecified: Secondary | ICD-10-CM | POA: Diagnosis not present

## 2019-09-06 DIAGNOSIS — Z7982 Long term (current) use of aspirin: Secondary | ICD-10-CM | POA: Diagnosis not present

## 2019-09-06 DIAGNOSIS — M170 Bilateral primary osteoarthritis of knee: Secondary | ICD-10-CM | POA: Diagnosis not present

## 2019-09-06 DIAGNOSIS — G2581 Restless legs syndrome: Secondary | ICD-10-CM | POA: Diagnosis not present

## 2019-09-06 DIAGNOSIS — Z87891 Personal history of nicotine dependence: Secondary | ICD-10-CM | POA: Diagnosis not present

## 2019-09-06 DIAGNOSIS — K219 Gastro-esophageal reflux disease without esophagitis: Secondary | ICD-10-CM | POA: Diagnosis not present

## 2019-09-06 DIAGNOSIS — G629 Polyneuropathy, unspecified: Secondary | ICD-10-CM | POA: Diagnosis not present

## 2019-09-06 DIAGNOSIS — K220 Achalasia of cardia: Secondary | ICD-10-CM | POA: Diagnosis not present

## 2019-09-06 DIAGNOSIS — I110 Hypertensive heart disease with heart failure: Secondary | ICD-10-CM | POA: Diagnosis not present

## 2019-09-06 DIAGNOSIS — R634 Abnormal weight loss: Secondary | ICD-10-CM | POA: Diagnosis not present

## 2019-09-06 DIAGNOSIS — Z48815 Encounter for surgical aftercare following surgery on the digestive system: Secondary | ICD-10-CM | POA: Diagnosis not present

## 2019-09-07 DIAGNOSIS — I509 Heart failure, unspecified: Secondary | ICD-10-CM | POA: Diagnosis not present

## 2019-09-07 DIAGNOSIS — K220 Achalasia of cardia: Secondary | ICD-10-CM | POA: Diagnosis not present

## 2019-09-07 DIAGNOSIS — Z7982 Long term (current) use of aspirin: Secondary | ICD-10-CM | POA: Diagnosis not present

## 2019-09-07 DIAGNOSIS — Z87891 Personal history of nicotine dependence: Secondary | ICD-10-CM | POA: Diagnosis not present

## 2019-09-07 DIAGNOSIS — K219 Gastro-esophageal reflux disease without esophagitis: Secondary | ICD-10-CM | POA: Diagnosis not present

## 2019-09-07 DIAGNOSIS — Z48815 Encounter for surgical aftercare following surgery on the digestive system: Secondary | ICD-10-CM | POA: Diagnosis not present

## 2019-09-07 DIAGNOSIS — G629 Polyneuropathy, unspecified: Secondary | ICD-10-CM | POA: Diagnosis not present

## 2019-09-07 DIAGNOSIS — R634 Abnormal weight loss: Secondary | ICD-10-CM | POA: Diagnosis not present

## 2019-09-07 DIAGNOSIS — M170 Bilateral primary osteoarthritis of knee: Secondary | ICD-10-CM | POA: Diagnosis not present

## 2019-09-07 DIAGNOSIS — I110 Hypertensive heart disease with heart failure: Secondary | ICD-10-CM | POA: Diagnosis not present

## 2019-09-07 DIAGNOSIS — E785 Hyperlipidemia, unspecified: Secondary | ICD-10-CM | POA: Diagnosis not present

## 2019-09-07 DIAGNOSIS — G2581 Restless legs syndrome: Secondary | ICD-10-CM | POA: Diagnosis not present

## 2019-09-10 ENCOUNTER — Telehealth: Payer: Self-pay | Admitting: Family Medicine

## 2019-09-10 DIAGNOSIS — Z87891 Personal history of nicotine dependence: Secondary | ICD-10-CM | POA: Diagnosis not present

## 2019-09-10 DIAGNOSIS — K220 Achalasia of cardia: Secondary | ICD-10-CM | POA: Diagnosis not present

## 2019-09-10 DIAGNOSIS — G2581 Restless legs syndrome: Secondary | ICD-10-CM | POA: Diagnosis not present

## 2019-09-10 DIAGNOSIS — Z7982 Long term (current) use of aspirin: Secondary | ICD-10-CM | POA: Diagnosis not present

## 2019-09-10 DIAGNOSIS — E785 Hyperlipidemia, unspecified: Secondary | ICD-10-CM | POA: Diagnosis not present

## 2019-09-10 DIAGNOSIS — G629 Polyneuropathy, unspecified: Secondary | ICD-10-CM | POA: Diagnosis not present

## 2019-09-10 DIAGNOSIS — Z48815 Encounter for surgical aftercare following surgery on the digestive system: Secondary | ICD-10-CM | POA: Diagnosis not present

## 2019-09-10 DIAGNOSIS — I110 Hypertensive heart disease with heart failure: Secondary | ICD-10-CM | POA: Diagnosis not present

## 2019-09-10 DIAGNOSIS — R634 Abnormal weight loss: Secondary | ICD-10-CM | POA: Diagnosis not present

## 2019-09-10 DIAGNOSIS — K219 Gastro-esophageal reflux disease without esophagitis: Secondary | ICD-10-CM | POA: Diagnosis not present

## 2019-09-10 DIAGNOSIS — M170 Bilateral primary osteoarthritis of knee: Secondary | ICD-10-CM | POA: Diagnosis not present

## 2019-09-10 DIAGNOSIS — I509 Heart failure, unspecified: Secondary | ICD-10-CM | POA: Diagnosis not present

## 2019-09-10 NOTE — Telephone Encounter (Signed)
Monique called from [facility] to get verbal approval for pt to have Home Healthcare affective the week of 09/03/19 for 1 wk 1, 2 wk 4, and 1 wk 2. Says this will be for strengthening, transfer, gate/balance training, home exercise program, and home safety.

## 2019-09-10 NOTE — Telephone Encounter (Signed)
Sounds good

## 2019-09-11 ENCOUNTER — Encounter: Payer: Self-pay | Admitting: Family Medicine

## 2019-09-11 ENCOUNTER — Ambulatory Visit (INDEPENDENT_AMBULATORY_CARE_PROVIDER_SITE_OTHER): Payer: Medicare Other | Admitting: Family Medicine

## 2019-09-11 DIAGNOSIS — K222 Esophageal obstruction: Secondary | ICD-10-CM | POA: Diagnosis not present

## 2019-09-11 NOTE — Telephone Encounter (Signed)
Verbal given 

## 2019-09-11 NOTE — Progress Notes (Signed)
Virtual Visit via Telephone Note  I connected with Allison Rollins on 09/11/19 at 3:36 PM by telephone and verified that I am speaking with the correct person using two identifiers. Allison Rollins is currently located at home and nobody is currently with her during this visit. The provider, Loman Brooklyn, FNP is located in their home at time of visit.  I discussed the limitations, risks, security and privacy concerns of performing an evaluation and management service by telephone and the availability of in person appointments. I also discussed with the patient that there may be a patient responsible charge related to this service. The patient expressed understanding and agreed to proceed.  Subjective: PCP: Loman Brooklyn, FNP  Chief Complaint  Patient presents with  . Hospitalization Follow-up   Patient was hospitalized at [facility] from 08/08/2019 - 08/21/2019 due to an esophageal obstruction and dehydration.  She was started on TPN and taken to the operating room on 08/14/2019 where she underwent laparoscopic Heller myotomy, partial fundoplication, and EGD without complications.  She was discharged to [facility] rehabilitation center for therapy.  Patient reports she is home and doing great.  She is eating anything she wants now.  She has home health physical therapy coming out.  She is walking good with her walker and is able to do things around her house such as laundry and dishes.  She has no concerns.   ROS: Per HPI  Current Outpatient Medications:  .  acetaminophen (TYLENOL) 500 MG tablet, Take 500-1,000 mg by mouth every 6 (six) hours as needed for moderate pain (pain.). , Disp: , Rfl:  .  albuterol (PROVENTIL HFA;VENTOLIN HFA) 108 (90 Base) MCG/ACT inhaler, Inhale 2 puffs into the lungs every 6 (six) hours as needed for wheezing., Disp: 1 Inhaler, Rfl: 0 .  albuterol (PROVENTIL) (2.5 MG/3ML) 0.083% nebulizer solution, Take 3 mLs (2.5 mg total) by nebulization every 6 (six) hours as  needed for wheezing or shortness of breath., Disp: 150 mL, Rfl: 1 .  Alcohol Swabs (B-D SINGLE USE SWABS REGULAR) PADS, Test sugars daily, Disp: 100 each, Rfl: 3 .  aspirin EC 81 MG tablet, Take 1 tablet (81 mg total) by mouth daily., Disp: , Rfl:  .  atorvastatin (LIPITOR) 40 MG tablet, Take 1 tablet (40 mg total) by mouth daily., Disp: 30 tablet, Rfl: 3 .  blood glucose meter kit and supplies, Accu-check Nano smartview meter, Disp: 1 each, Rfl: 0 .  Blood Glucose Monitoring Suppl (ACCU-CHEK AVIVA PLUS) w/Device KIT, Test sugars daily, Disp: 1 kit, Rfl: 0 .  cetirizine (ZYRTEC) 10 MG tablet, Take 1 tablet by mouth every day, Disp: 30 tablet, Rfl: 5 .  cholecalciferol (VITAMIN D3) 25 MCG (1000 UT) tablet, Take 1,000 Units by mouth 2 (two) times daily. , Disp: , Rfl:  .  fluticasone (FLONASE) 50 MCG/ACT nasal spray, Place 2 sprays into both nostrils daily. (Patient taking differently: Place 2 sprays into both nostrils at bedtime. ), Disp: 16 g, Rfl: 6 .  furosemide (LASIX) 20 MG tablet, Take 1 tablet by mouth every day as needed, Disp: 30 tablet, Rfl: 1 .  gabapentin (NEURONTIN) 300 MG capsule, Take 1 capsule in morning & 2 caps every evening, Disp: 90 capsule, Rfl: 5 .  glucose blood (ACCU-CHEK AVIVA PLUS) test strip, Test sugars daily, Disp: 100 each, Rfl: 3 .  HYDROcodone-acetaminophen (NORCO/VICODIN) 5-325 MG tablet, Take 1 tablet by mouth 3 (three) times daily as needed., Disp: , Rfl:  .  lisinopril (  ZESTRIL) 10 MG tablet, Take 1 tablet by mouth every day, Disp: 30 tablet, Rfl: 11 .  meloxicam (MOBIC) 7.5 MG tablet, Take 1 tab by mouth every day as needed for pain (Patient taking differently: Take 7.5 mg by mouth daily. ), Disp: 30 tablet, Rfl: 1 .  Multiple Vitamin (MULTIVITAMIN WITH MINERALS) TABS tablet, Take 1 tablet by mouth daily., Disp: , Rfl:  .  nitroGLYCERIN (NITROSTAT) 0.4 MG SL tablet, Place 1 tablet (0.4 mg total) under the tongue every 5 (five) minutes as needed for chest pain.,  Disp: 20 tablet, Rfl: 3 .  nystatin cream (MYCOSTATIN), Apply 1 application topically twice daily, Disp: 30 g, Rfl: 11 .  Omega-3 Fatty Acids (FISH OIL PO), Take 1 capsule by mouth daily. , Disp: , Rfl:  .  pantoprazole (PROTONIX) 40 MG tablet, TAKE ONE TABLET BY MOUTH TWICE DAILY, Disp: 60 tablet, Rfl: 11 .  potassium chloride SA (K-DUR) 20 MEQ tablet, Take 20 mEq by mouth daily as needed (with lasix)., Disp: , Rfl:  .  pramipexole (MIRAPEX) 0.25 MG tablet, Take 2 tablets (0.5 mg total) by mouth at bedtime., Disp: 60 tablet, Rfl: 0 .  sertraline (ZOLOFT) 100 MG tablet, Take 1 tablet by mouth every day, Disp: 30 tablet, Rfl: 5  Allergies  Allergen Reactions  . Penicillins Itching and Rash    Has patient had a PCN reaction causing immediate rash, facial/tongue/throat swelling, SOB or lightheadedness with hypotension: no Has patient had a PCN reaction causing severe rash involving mucus membranes or skin necrosis: no Has patient had a PCN reaction that required hospitalization: no Has patient had a PCN reaction occurring within the last 10 years: no If all of the above answers are "NO", then may proceed with Cephalosporin use.   . Sulfa Antibiotics Rash   Past Medical History:  Diagnosis Date  . Anxiety   . Asthma   . Chest pain 12/22/2015  . CHF (congestive heart failure) ([place])   . COPD (chronic obstructive pulmonary disease) ([place])   . Depression   . Diabetes mellitus without complication ([place])   . Headache   . HTN (hypertension)   . Hypoglycemia   . Hypokalemia 07/28/2013  . Kidney stones   . Parkinson's disease   . Pneumonia   . Pre-diabetes   . S/P colonoscopy 10/15/04   normal    Observations/Objective: A&O  No respiratory distress or wheezing audible over the phone Mood, judgement, and thought processes all WNL  Assessment and Plan: 1. Esophageal obstruction - Resolved and doing well. Eating what she wants.    Follow Up Instructions:  I discussed the assessment and  treatment plan with the patient. The patient was provided an opportunity to ask questions and all were answered. The patient agreed with the plan and demonstrated an understanding of the instructions.   The patient was advised to call back or seek an in-person evaluation if the symptoms worsen or if the condition fails to improve as anticipated.  The above assessment and management plan was discussed with the patient. The patient verbalized understanding of and has agreed to the management plan. Patient is aware to call the clinic if symptoms persist or worsen. Patient is aware when to return to the clinic for a follow-up visit. Patient educated on when it is appropriate to go to the emergency department.   Time call ended: 3:51 PM  I provided 17 minutes of non-face-to-face time during this encounter.  [name], MSN, APRN, FNP-C [place]  09/11/19   

## 2019-09-12 ENCOUNTER — Ambulatory Visit (INDEPENDENT_AMBULATORY_CARE_PROVIDER_SITE_OTHER): Payer: Medicare Other

## 2019-09-12 ENCOUNTER — Other Ambulatory Visit: Payer: Self-pay

## 2019-09-12 DIAGNOSIS — E785 Hyperlipidemia, unspecified: Secondary | ICD-10-CM

## 2019-09-12 DIAGNOSIS — Z87891 Personal history of nicotine dependence: Secondary | ICD-10-CM

## 2019-09-12 DIAGNOSIS — Z7982 Long term (current) use of aspirin: Secondary | ICD-10-CM

## 2019-09-12 DIAGNOSIS — G629 Polyneuropathy, unspecified: Secondary | ICD-10-CM | POA: Diagnosis not present

## 2019-09-12 DIAGNOSIS — M170 Bilateral primary osteoarthritis of knee: Secondary | ICD-10-CM

## 2019-09-12 DIAGNOSIS — K219 Gastro-esophageal reflux disease without esophagitis: Secondary | ICD-10-CM | POA: Diagnosis not present

## 2019-09-12 DIAGNOSIS — I509 Heart failure, unspecified: Secondary | ICD-10-CM

## 2019-09-12 DIAGNOSIS — Z6835 Body mass index (BMI) 35.0-35.9, adult: Secondary | ICD-10-CM

## 2019-09-12 DIAGNOSIS — R634 Abnormal weight loss: Secondary | ICD-10-CM | POA: Diagnosis not present

## 2019-09-12 DIAGNOSIS — Z48815 Encounter for surgical aftercare following surgery on the digestive system: Secondary | ICD-10-CM

## 2019-09-12 DIAGNOSIS — I110 Hypertensive heart disease with heart failure: Secondary | ICD-10-CM

## 2019-09-12 DIAGNOSIS — F329 Major depressive disorder, single episode, unspecified: Secondary | ICD-10-CM

## 2019-09-12 DIAGNOSIS — K220 Achalasia of cardia: Secondary | ICD-10-CM

## 2019-09-12 DIAGNOSIS — G2581 Restless legs syndrome: Secondary | ICD-10-CM

## 2019-09-17 DIAGNOSIS — I509 Heart failure, unspecified: Secondary | ICD-10-CM | POA: Diagnosis not present

## 2019-09-17 DIAGNOSIS — G629 Polyneuropathy, unspecified: Secondary | ICD-10-CM | POA: Diagnosis not present

## 2019-09-17 DIAGNOSIS — G2581 Restless legs syndrome: Secondary | ICD-10-CM | POA: Diagnosis not present

## 2019-09-17 DIAGNOSIS — K219 Gastro-esophageal reflux disease without esophagitis: Secondary | ICD-10-CM | POA: Diagnosis not present

## 2019-09-17 DIAGNOSIS — K220 Achalasia of cardia: Secondary | ICD-10-CM | POA: Diagnosis not present

## 2019-09-17 DIAGNOSIS — Z48815 Encounter for surgical aftercare following surgery on the digestive system: Secondary | ICD-10-CM | POA: Diagnosis not present

## 2019-09-17 DIAGNOSIS — M170 Bilateral primary osteoarthritis of knee: Secondary | ICD-10-CM | POA: Diagnosis not present

## 2019-09-17 DIAGNOSIS — E785 Hyperlipidemia, unspecified: Secondary | ICD-10-CM | POA: Diagnosis not present

## 2019-09-17 DIAGNOSIS — R634 Abnormal weight loss: Secondary | ICD-10-CM | POA: Diagnosis not present

## 2019-09-17 DIAGNOSIS — Z7982 Long term (current) use of aspirin: Secondary | ICD-10-CM | POA: Diagnosis not present

## 2019-09-17 DIAGNOSIS — I110 Hypertensive heart disease with heart failure: Secondary | ICD-10-CM | POA: Diagnosis not present

## 2019-09-17 DIAGNOSIS — Z87891 Personal history of nicotine dependence: Secondary | ICD-10-CM | POA: Diagnosis not present

## 2019-09-19 DIAGNOSIS — G2581 Restless legs syndrome: Secondary | ICD-10-CM | POA: Diagnosis not present

## 2019-09-19 DIAGNOSIS — G629 Polyneuropathy, unspecified: Secondary | ICD-10-CM | POA: Diagnosis not present

## 2019-09-19 DIAGNOSIS — Z87891 Personal history of nicotine dependence: Secondary | ICD-10-CM | POA: Diagnosis not present

## 2019-09-19 DIAGNOSIS — E785 Hyperlipidemia, unspecified: Secondary | ICD-10-CM | POA: Diagnosis not present

## 2019-09-19 DIAGNOSIS — R634 Abnormal weight loss: Secondary | ICD-10-CM | POA: Diagnosis not present

## 2019-09-19 DIAGNOSIS — M170 Bilateral primary osteoarthritis of knee: Secondary | ICD-10-CM | POA: Diagnosis not present

## 2019-09-19 DIAGNOSIS — K220 Achalasia of cardia: Secondary | ICD-10-CM | POA: Diagnosis not present

## 2019-09-19 DIAGNOSIS — I110 Hypertensive heart disease with heart failure: Secondary | ICD-10-CM | POA: Diagnosis not present

## 2019-09-19 DIAGNOSIS — K219 Gastro-esophageal reflux disease without esophagitis: Secondary | ICD-10-CM | POA: Diagnosis not present

## 2019-09-19 DIAGNOSIS — Z48815 Encounter for surgical aftercare following surgery on the digestive system: Secondary | ICD-10-CM | POA: Diagnosis not present

## 2019-09-19 DIAGNOSIS — I509 Heart failure, unspecified: Secondary | ICD-10-CM | POA: Diagnosis not present

## 2019-09-19 DIAGNOSIS — Z7982 Long term (current) use of aspirin: Secondary | ICD-10-CM | POA: Diagnosis not present

## 2019-09-21 DIAGNOSIS — R6889 Other general symptoms and signs: Secondary | ICD-10-CM | POA: Diagnosis not present

## 2019-09-21 DIAGNOSIS — Z48815 Encounter for surgical aftercare following surgery on the digestive system: Secondary | ICD-10-CM | POA: Diagnosis not present

## 2019-09-21 DIAGNOSIS — K220 Achalasia of cardia: Secondary | ICD-10-CM | POA: Diagnosis not present

## 2019-09-25 ENCOUNTER — Ambulatory Visit: Payer: Medicare Other | Admitting: Family Medicine

## 2019-09-26 ENCOUNTER — Telehealth: Payer: Self-pay | Admitting: Family Medicine

## 2019-09-26 NOTE — Telephone Encounter (Signed)
Yes please

## 2019-09-26 NOTE — Telephone Encounter (Signed)
Verbal order approved.  

## 2019-09-26 NOTE — Telephone Encounter (Signed)
Dawn called from [facility] to get verbal order to change MSW Evaluation from last week to this week. Says they could not reach patient last week.

## 2019-09-27 ENCOUNTER — Ambulatory Visit: Payer: Medicare Other | Admitting: Family Medicine

## 2019-09-27 DIAGNOSIS — Z48815 Encounter for surgical aftercare following surgery on the digestive system: Secondary | ICD-10-CM | POA: Diagnosis not present

## 2019-09-27 DIAGNOSIS — Z87891 Personal history of nicotine dependence: Secondary | ICD-10-CM | POA: Diagnosis not present

## 2019-09-27 DIAGNOSIS — R634 Abnormal weight loss: Secondary | ICD-10-CM | POA: Diagnosis not present

## 2019-09-27 DIAGNOSIS — M170 Bilateral primary osteoarthritis of knee: Secondary | ICD-10-CM | POA: Diagnosis not present

## 2019-09-27 DIAGNOSIS — G2581 Restless legs syndrome: Secondary | ICD-10-CM | POA: Diagnosis not present

## 2019-09-27 DIAGNOSIS — K219 Gastro-esophageal reflux disease without esophagitis: Secondary | ICD-10-CM | POA: Diagnosis not present

## 2019-09-27 DIAGNOSIS — I509 Heart failure, unspecified: Secondary | ICD-10-CM | POA: Diagnosis not present

## 2019-09-27 DIAGNOSIS — I110 Hypertensive heart disease with heart failure: Secondary | ICD-10-CM | POA: Diagnosis not present

## 2019-09-27 DIAGNOSIS — E785 Hyperlipidemia, unspecified: Secondary | ICD-10-CM | POA: Diagnosis not present

## 2019-09-27 DIAGNOSIS — K220 Achalasia of cardia: Secondary | ICD-10-CM | POA: Diagnosis not present

## 2019-09-27 DIAGNOSIS — G629 Polyneuropathy, unspecified: Secondary | ICD-10-CM | POA: Diagnosis not present

## 2019-09-27 DIAGNOSIS — Z7982 Long term (current) use of aspirin: Secondary | ICD-10-CM | POA: Diagnosis not present

## 2019-09-27 NOTE — Telephone Encounter (Signed)
Dawn aware and verbalizes understanding.

## 2019-09-28 DIAGNOSIS — I110 Hypertensive heart disease with heart failure: Secondary | ICD-10-CM | POA: Diagnosis not present

## 2019-09-28 DIAGNOSIS — R634 Abnormal weight loss: Secondary | ICD-10-CM | POA: Diagnosis not present

## 2019-09-28 DIAGNOSIS — E785 Hyperlipidemia, unspecified: Secondary | ICD-10-CM | POA: Diagnosis not present

## 2019-09-28 DIAGNOSIS — K220 Achalasia of cardia: Secondary | ICD-10-CM | POA: Diagnosis not present

## 2019-09-28 DIAGNOSIS — Z48815 Encounter for surgical aftercare following surgery on the digestive system: Secondary | ICD-10-CM | POA: Diagnosis not present

## 2019-09-28 DIAGNOSIS — I509 Heart failure, unspecified: Secondary | ICD-10-CM | POA: Diagnosis not present

## 2019-09-28 DIAGNOSIS — G2581 Restless legs syndrome: Secondary | ICD-10-CM | POA: Diagnosis not present

## 2019-09-28 DIAGNOSIS — M170 Bilateral primary osteoarthritis of knee: Secondary | ICD-10-CM | POA: Diagnosis not present

## 2019-09-28 DIAGNOSIS — G629 Polyneuropathy, unspecified: Secondary | ICD-10-CM | POA: Diagnosis not present

## 2019-09-28 DIAGNOSIS — Z87891 Personal history of nicotine dependence: Secondary | ICD-10-CM | POA: Diagnosis not present

## 2019-09-28 DIAGNOSIS — K219 Gastro-esophageal reflux disease without esophagitis: Secondary | ICD-10-CM | POA: Diagnosis not present

## 2019-09-28 DIAGNOSIS — Z7982 Long term (current) use of aspirin: Secondary | ICD-10-CM | POA: Diagnosis not present

## 2019-09-29 DIAGNOSIS — M6281 Muscle weakness (generalized): Secondary | ICD-10-CM | POA: Diagnosis not present

## 2019-10-01 DIAGNOSIS — I110 Hypertensive heart disease with heart failure: Secondary | ICD-10-CM | POA: Diagnosis not present

## 2019-10-01 DIAGNOSIS — R634 Abnormal weight loss: Secondary | ICD-10-CM | POA: Diagnosis not present

## 2019-10-01 DIAGNOSIS — M170 Bilateral primary osteoarthritis of knee: Secondary | ICD-10-CM | POA: Diagnosis not present

## 2019-10-01 DIAGNOSIS — E785 Hyperlipidemia, unspecified: Secondary | ICD-10-CM | POA: Diagnosis not present

## 2019-10-01 DIAGNOSIS — G2581 Restless legs syndrome: Secondary | ICD-10-CM | POA: Diagnosis not present

## 2019-10-01 DIAGNOSIS — Z7982 Long term (current) use of aspirin: Secondary | ICD-10-CM | POA: Diagnosis not present

## 2019-10-01 DIAGNOSIS — G629 Polyneuropathy, unspecified: Secondary | ICD-10-CM | POA: Diagnosis not present

## 2019-10-01 DIAGNOSIS — I509 Heart failure, unspecified: Secondary | ICD-10-CM | POA: Diagnosis not present

## 2019-10-01 DIAGNOSIS — Z48815 Encounter for surgical aftercare following surgery on the digestive system: Secondary | ICD-10-CM | POA: Diagnosis not present

## 2019-10-01 DIAGNOSIS — K220 Achalasia of cardia: Secondary | ICD-10-CM | POA: Diagnosis not present

## 2019-10-01 DIAGNOSIS — K219 Gastro-esophageal reflux disease without esophagitis: Secondary | ICD-10-CM | POA: Diagnosis not present

## 2019-10-01 DIAGNOSIS — Z87891 Personal history of nicotine dependence: Secondary | ICD-10-CM | POA: Diagnosis not present

## 2019-10-04 ENCOUNTER — Telehealth: Payer: Self-pay | Admitting: Family Medicine

## 2019-10-04 NOTE — Telephone Encounter (Signed)
[facility] called stating that they received order from Dr Nadine Counts (order# [id number]) but says it is not dated. Requesting that date be added next to signature and resent to them.  (Fax# [phone])

## 2019-10-04 NOTE — Telephone Encounter (Signed)
Please review and advise.

## 2019-10-04 NOTE — Telephone Encounter (Signed)
PPW corrected and faxed back

## 2019-10-08 ENCOUNTER — Other Ambulatory Visit: Payer: Self-pay | Admitting: *Deleted

## 2019-10-08 DIAGNOSIS — B372 Candidiasis of skin and nail: Secondary | ICD-10-CM

## 2019-10-08 DIAGNOSIS — K219 Gastro-esophageal reflux disease without esophagitis: Secondary | ICD-10-CM | POA: Diagnosis not present

## 2019-10-08 DIAGNOSIS — Z48815 Encounter for surgical aftercare following surgery on the digestive system: Secondary | ICD-10-CM | POA: Diagnosis not present

## 2019-10-08 DIAGNOSIS — E785 Hyperlipidemia, unspecified: Secondary | ICD-10-CM | POA: Diagnosis not present

## 2019-10-08 DIAGNOSIS — G2581 Restless legs syndrome: Secondary | ICD-10-CM | POA: Diagnosis not present

## 2019-10-08 DIAGNOSIS — I110 Hypertensive heart disease with heart failure: Secondary | ICD-10-CM | POA: Diagnosis not present

## 2019-10-08 DIAGNOSIS — I509 Heart failure, unspecified: Secondary | ICD-10-CM | POA: Diagnosis not present

## 2019-10-08 DIAGNOSIS — R634 Abnormal weight loss: Secondary | ICD-10-CM | POA: Diagnosis not present

## 2019-10-08 DIAGNOSIS — Z7982 Long term (current) use of aspirin: Secondary | ICD-10-CM | POA: Diagnosis not present

## 2019-10-08 DIAGNOSIS — K220 Achalasia of cardia: Secondary | ICD-10-CM | POA: Diagnosis not present

## 2019-10-08 DIAGNOSIS — M170 Bilateral primary osteoarthritis of knee: Secondary | ICD-10-CM | POA: Diagnosis not present

## 2019-10-08 DIAGNOSIS — G629 Polyneuropathy, unspecified: Secondary | ICD-10-CM | POA: Diagnosis not present

## 2019-10-08 DIAGNOSIS — Z87891 Personal history of nicotine dependence: Secondary | ICD-10-CM | POA: Diagnosis not present

## 2019-10-08 MED ORDER — FUROSEMIDE 20 MG PO TABS: 20.0000 mg | ORAL_TABLET | Freq: Every day | ORAL | 0 refills | Status: DC | PRN

## 2019-10-09 MED ORDER — NYSTATIN 100000 UNIT/GM EX CREA: TOPICAL_CREAM | CUTANEOUS | 0 refills | Status: DC

## 2019-10-16 ENCOUNTER — Other Ambulatory Visit: Payer: Self-pay | Admitting: *Deleted

## 2019-10-16 DIAGNOSIS — J301 Allergic rhinitis due to pollen: Secondary | ICD-10-CM

## 2019-10-16 MED ORDER — FLUTICASONE PROPIONATE 50 MCG/ACT NA SUSP: 2.0000 | Freq: Every day | NASAL | 6 refills | Status: DC

## 2019-10-19 ENCOUNTER — Encounter: Payer: Self-pay | Admitting: Family Medicine

## 2019-10-19 ENCOUNTER — Ambulatory Visit (INDEPENDENT_AMBULATORY_CARE_PROVIDER_SITE_OTHER): Payer: Medicare Other | Admitting: Family Medicine

## 2019-10-19 ENCOUNTER — Other Ambulatory Visit: Payer: Self-pay

## 2019-10-19 VITALS — BP 125/65 | HR 64 | Temp 98.3°F | Ht 66.0 in | Wt 213.2 lb

## 2019-10-19 DIAGNOSIS — R7303 Prediabetes: Secondary | ICD-10-CM

## 2019-10-19 DIAGNOSIS — I5032 Chronic diastolic (congestive) heart failure: Secondary | ICD-10-CM | POA: Diagnosis not present

## 2019-10-19 DIAGNOSIS — G2581 Restless legs syndrome: Secondary | ICD-10-CM | POA: Diagnosis not present

## 2019-10-19 DIAGNOSIS — E785 Hyperlipidemia, unspecified: Secondary | ICD-10-CM

## 2019-10-19 DIAGNOSIS — Z8639 Personal history of other endocrine, nutritional and metabolic disease: Secondary | ICD-10-CM | POA: Diagnosis not present

## 2019-10-19 DIAGNOSIS — E559 Vitamin D deficiency, unspecified: Secondary | ICD-10-CM | POA: Diagnosis not present

## 2019-10-19 DIAGNOSIS — E1142 Type 2 diabetes mellitus with diabetic polyneuropathy: Secondary | ICD-10-CM | POA: Diagnosis not present

## 2019-10-19 DIAGNOSIS — E1169 Type 2 diabetes mellitus with other specified complication: Secondary | ICD-10-CM | POA: Diagnosis not present

## 2019-10-19 DIAGNOSIS — I10 Essential (primary) hypertension: Secondary | ICD-10-CM

## 2019-10-19 DIAGNOSIS — G629 Polyneuropathy, unspecified: Secondary | ICD-10-CM

## 2019-10-19 DIAGNOSIS — F339 Major depressive disorder, recurrent, unspecified: Secondary | ICD-10-CM

## 2019-10-19 DIAGNOSIS — J301 Allergic rhinitis due to pollen: Secondary | ICD-10-CM

## 2019-10-19 DIAGNOSIS — K220 Achalasia of cardia: Secondary | ICD-10-CM

## 2019-10-19 DIAGNOSIS — K2100 Gastro-esophageal reflux disease with esophagitis, without bleeding: Secondary | ICD-10-CM

## 2019-10-19 DIAGNOSIS — F411 Generalized anxiety disorder: Secondary | ICD-10-CM

## 2019-10-19 LAB — BAYER DCA HB A1C WAIVED: HB A1C (BAYER DCA - WAIVED): 5.6 % (ref <7.0)

## 2019-10-19 MED ORDER — SERTRALINE HCL 100 MG PO TABS: 100.0000 mg | ORAL_TABLET | Freq: Every day | ORAL | 1 refills | Status: DC

## 2019-10-19 MED ORDER — FUROSEMIDE 20 MG PO TABS: 20.0000 mg | ORAL_TABLET | Freq: Every day | ORAL | 1 refills | Status: DC | PRN

## 2019-10-19 MED ORDER — PRAMIPEXOLE DIHYDROCHLORIDE 0.5 MG PO TABS: 0.5000 mg | ORAL_TABLET | Freq: Every day | ORAL | 1 refills | Status: DC

## 2019-10-19 MED ORDER — LISINOPRIL 10 MG PO TABS: 10.0000 mg | ORAL_TABLET | Freq: Every day | ORAL | 1 refills | Status: DC

## 2019-10-19 MED ORDER — CETIRIZINE HCL 10 MG PO TABS: 10.0000 mg | ORAL_TABLET | Freq: Every day | ORAL | 1 refills | Status: DC

## 2019-10-19 MED ORDER — GABAPENTIN 300 MG PO CAPS: ORAL_CAPSULE | ORAL | 1 refills | Status: DC

## 2019-10-19 MED ORDER — ATORVASTATIN CALCIUM 40 MG PO TABS: 40.0000 mg | ORAL_TABLET | Freq: Every day | ORAL | 1 refills | Status: DC

## 2019-10-19 NOTE — Progress Notes (Signed)
Assessment & Plan:  1. Prediabetes - Controlled without medication.  Diet and exercise encouraged. - Bayer DCA Hb A1c Waived - CMP14+EGFR  2. Benign hypertension - Well controlled on current regimen.  - lisinopril (ZESTRIL) 10 MG tablet; Take 1 tablet (10 mg total) by mouth daily.  Dispense: 90 tablet; Refill: 1 - CMP14+EGFR  3. Neuropathy - Well controlled on current regimen.  - gabapentin (NEURONTIN) 300 MG capsule; Take 1 capsule (300 mg total) by mouth in the morning AND 2 capsules (600 mg total) at bedtime.  Dispense: 270 capsule; Refill: 1 - CMP14+EGFR  4. Restless leg - Well controlled on current regimen.  - pramipexole (MIRAPEX) 0.5 MG tablet; Take 1 tablet (0.5 mg total) by mouth at bedtime.  Dispense: 90 tablet; Refill: 1 - CMP14+EGFR  5. Chronic diastolic heart failure (HCC) - Well controlled on current regimen.  - furosemide (LASIX) 20 MG tablet; Take 1 tablet (20 mg total) by mouth daily as needed.  Dispense: 90 tablet; Refill: 1 - CMP14+EGFR  6. Hyperlipidemia associated with type 2 diabetes mellitus ([place]) - Well controlled on current regimen.  - atorvastatin (LIPITOR) 40 MG tablet; Take 1 tablet (40 mg total) by mouth daily.  Dispense: 90 tablet; Refill: 1 - CMP14+EGFR  7. Seasonal allergic rhinitis due to pollen - Well controlled on current regimen.  - cetirizine (ZYRTEC) 10 MG tablet; Take 1 tablet (10 mg total) by mouth daily.  Dispense: 90 tablet; Refill: 1  8. Vitamin D deficiency - Well controlled on current regimen.  - VITAMIN D 25 Hydroxy (Vit-D Deficiency, Fractures)  9. Recurrent depression ([place]) - Well controlled on current regimen.  - sertraline (ZOLOFT) 100 MG tablet; Take 1 tablet (100 mg total) by mouth daily.  Dispense: 90 tablet; Refill: 1 - CMP14+EGFR  10. Generalized anxiety disorder - Well controlled on current regimen.   11. Achalasia of esophagus - Doing well since her recent surgery in April.   12. Gastroesophageal reflux  disease with esophagitis, unspecified whether hemorrhage - Well controlled on current regimen.    Return in about 3 months (around 01/19/2020) for follow-up of chronic medication conditions.  [name], MSN, APRN, FNP-C [facility]  Subjective:    Patient ID: [name], female    DOB: 1951-02-03, 69 y.o.   MRN: [id number]  Patient Care Team: Loman Brooklyn, FNP as PCP - General (Family Medicine) Harl Bowie Alphonse Guild, MD as PCP - Cardiology (Cardiology) Danie Binder, MD (Inactive) (Gastroenterology) Phillips Odor, MD as Consulting Physician (Neurology) Harl Bowie Alphonse Guild, MD as Consulting Physician (Cardiology)   Chief Complaint:  Chief Complaint  Patient presents with  . Diabetes    3 month follow up of chronic medical conditions    HPI: XZARIA TEO is a 69 y.o. female presenting on 10/19/2019 for Diabetes (3 month follow up of chronic medical conditions)  Pre-diabetes:  Lab Results  Component Value Date   HGBA1C 5.6 10/19/2019   HGBA1C 6.0 05/31/2019   HGBA1C 5.9 02/26/2019   Patient has no complaints or concerns today.  She is eating much better now that she can swallow since having her surgery in April.   Social history:  Relevant past medical, surgical, family and social history reviewed and updated as indicated. Interim medical history since our last visit reviewed.  Allergies and medications reviewed and updated.  DATA REVIEWED: CHART IN EPIC  ROS: Negative unless specifically indicated above in HPI.    Current Outpatient Medications:  .  acetaminophen (  TYLENOL) 500 MG tablet, Take 500-1,000 mg by mouth every 6 (six) hours as needed for moderate pain (pain.). , Disp: , Rfl:  .  albuterol (PROVENTIL HFA;VENTOLIN HFA) 108 (90 Base) MCG/ACT inhaler, Inhale 2 puffs into the lungs every 6 (six) hours as needed for wheezing., Disp: 1 Inhaler, Rfl: 0 .  albuterol (PROVENTIL) (2.5 MG/3ML) 0.083% nebulizer solution, Take 3 mLs (2.5  mg total) by nebulization every 6 (six) hours as needed for wheezing or shortness of breath., Disp: 150 mL, Rfl: 1 .  Alcohol Swabs (B-D SINGLE USE SWABS REGULAR) PADS, Test sugars daily, Disp: 100 each, Rfl: 3 .  aspirin EC 81 MG tablet, Take 1 tablet (81 mg total) by mouth daily., Disp: , Rfl:  .  atorvastatin (LIPITOR) 40 MG tablet, Take 1 tablet (40 mg total) by mouth daily., Disp: 30 tablet, Rfl: 3 .  blood glucose meter kit and supplies, Accu-check Nano smartview meter, Disp: 1 each, Rfl: 0 .  Blood Glucose Monitoring Suppl (ACCU-CHEK AVIVA PLUS) w/Device KIT, Test sugars daily, Disp: 1 kit, Rfl: 0 .  cetirizine (ZYRTEC) 10 MG tablet, Take 1 tablet by mouth every day, Disp: 30 tablet, Rfl: 5 .  cholecalciferol (VITAMIN D3) 25 MCG (1000 UT) tablet, Take 1,000 Units by mouth 2 (two) times daily. , Disp: , Rfl:  .  fluticasone (FLONASE) 50 MCG/ACT nasal spray, Place 2 sprays into both nostrils daily., Disp: 16 g, Rfl: 6 .  furosemide (LASIX) 20 MG tablet, Take 1 tablet (20 mg total) by mouth daily as needed., Disp: 30 tablet, Rfl: 0 .  gabapentin (NEURONTIN) 300 MG capsule, Take 1 capsule in morning & 2 caps every evening, Disp: 90 capsule, Rfl: 5 .  glucose blood (ACCU-CHEK AVIVA PLUS) test strip, Test sugars daily, Disp: 100 each, Rfl: 3 .  HYDROcodone-acetaminophen (NORCO/VICODIN) 5-325 MG tablet, Take 1 tablet by mouth 3 (three) times daily as needed., Disp: , Rfl:  .  lisinopril (ZESTRIL) 10 MG tablet, Take 1 tablet by mouth every day, Disp: 30 tablet, Rfl: 11 .  meloxicam (MOBIC) 7.5 MG tablet, Take 1 tab by mouth every day as needed for pain (Patient taking differently: Take 7.5 mg by mouth daily. ), Disp: 30 tablet, Rfl: 1 .  Multiple Vitamin (MULTIVITAMIN WITH MINERALS) TABS tablet, Take 1 tablet by mouth daily., Disp: , Rfl:  .  nitroGLYCERIN (NITROSTAT) 0.4 MG SL tablet, Place 1 tablet (0.4 mg total) under the tongue every 5 (five) minutes as needed for chest pain., Disp: 20 tablet,  Rfl: 3 .  nystatin cream (MYCOSTATIN), Apply 1 application topically twice daily, Disp: 30 g, Rfl: 0 .  Omega-3 Fatty Acids (FISH OIL PO), Take 1 capsule by mouth daily. , Disp: , Rfl:  .  pantoprazole (PROTONIX) 40 MG tablet, TAKE ONE TABLET BY MOUTH TWICE DAILY, Disp: 60 tablet, Rfl: 11 .  potassium chloride SA (K-DUR) 20 MEQ tablet, Take 20 mEq by mouth daily as needed (with lasix)., Disp: , Rfl:  .  pramipexole (MIRAPEX) 0.25 MG tablet, Take 2 tablets (0.5 mg total) by mouth at bedtime., Disp: 60 tablet, Rfl: 0 .  sertraline (ZOLOFT) 100 MG tablet, Take 1 tablet by mouth every day, Disp: 30 tablet, Rfl: 5   Allergies  Allergen Reactions  . Penicillins Itching and Rash    Has patient had a PCN reaction causing immediate rash, facial/tongue/throat swelling, SOB or lightheadedness with hypotension: no Has patient had a PCN reaction causing severe rash involving mucus membranes or skin necrosis: no  Has patient had a PCN reaction that required hospitalization: no Has patient had a PCN reaction occurring within the last 10 years: no If all of the above answers are "NO", then may proceed with Cephalosporin use.   . Sulfa Antibiotics Rash   Past Medical History:  Diagnosis Date  . Anxiety   . Asthma   . Chest pain 12/22/2015  . CHF (congestive heart failure) ([place])   . COPD (chronic obstructive pulmonary disease) ([place])   . Depression   . Diabetes mellitus without complication ([place])   . Headache   . HTN (hypertension)   . Hypoglycemia   . Hypokalemia 07/28/2013  . Kidney stones   . Parkinson's disease   . Pneumonia   . Pre-diabetes   . S/P colonoscopy 10/15/04   normal    Past Surgical History:  Procedure Laterality Date  . CHOLECYSTECTOMY    . COLONOSCOPY  10/15/2004   Normal rectum/Normal colon  . COLONOSCOPY N/A 05/10/2016   Procedure: COLONOSCOPY;  Surgeon: Danie Binder, MD;  Location: [facility];  Service: Endoscopy;  Laterality: N/A;  10:30  . ESOPHAGEAL MANOMETRY N/A  04/02/2019   Procedure: ESOPHAGEAL MANOMETRY (EM);  Surgeon: Mauri Pole, MD;  Location: [facility];  Service: Endoscopy;  Laterality: N/A;  . ESOPHAGOGASTRODUODENOSCOPY  09/11/2010   Dysphagia likely multifactorial (possible Candida esophagitis, likely nonspecific esophageal motility disorder, and/or uncontrolled gastroesophageal reflux disease), status post empiric dilation  . ESOPHAGOGASTRODUODENOSCOPY N/A 11/13/2018   Dr. Oneida Alar: No endoscopic esophageal abnormality to explain patient's dysphagia.  Esophagus dilated.  Moderate erosive/nodular gastritis with benign biopsies.  Marland Kitchen KIDNEY STONE SURGERY    . SAVORY DILATION N/A 11/13/2018   Procedure: SAVORY DILATION;  Surgeon: Danie Binder, MD;  Location: [facility];  Service: Endoscopy;  Laterality: N/A;    Social History   Socioeconomic History  . Marital status: Widowed    Spouse name: Not on file  . Number of children: 2  . Years of education: Not on file  . Highest education level: Not on file  Occupational History  . Occupation: disabled  Tobacco Use  . Smoking status: Former Smoker    Packs/day: 0.25    Years: 30.00    Pack years: 7.50    Types: Cigarettes    Start date: 01/04/1965    Quit date: 01/03/1995    Years since quitting: 24.8  . Smokeless tobacco: Never Used  Vaping Use  . Vaping Use: Never used  Substance and Sexual Activity  . Alcohol use: No    Alcohol/week: 0.0 standard drinks  . Drug use: No  . Sexual activity: Not on file  Other Topics Concern  . Not on file  Social History Narrative  . Not on file   Social Determinants of Health   Financial Resource Strain:   . Difficulty of Paying Living Expenses:   Food Insecurity:   . Worried About Charity fundraiser in the Last Year:   . Arboriculturist in the Last Year:   Transportation Needs:   . Film/video editor (Medical):   Marland Kitchen Lack of Transportation (Non-Medical):   Physical Activity:   . Days of Exercise per Week:   . Minutes of  Exercise per Session:   Stress:   . Feeling of Stress :   Social Connections:   . Frequency of Communication with Friends and Family:   . Frequency of Social Gatherings with Friends and Family:   . Attends Religious Services:   . Active  Member of Clubs or Organizations:   . Attends Archivist Meetings:   Marland Kitchen Marital Status:   Intimate Partner Violence:   . Fear of Current or Ex-Partner:   . Emotionally Abused:   Marland Kitchen Physically Abused:   . Sexually Abused:         Objective:    BP 125/65   Pulse 64   Temp 98.3 F (36.8 C) (Temporal)   Ht 5' 6"  (1.676 m)   Wt 213 lb 3.2 oz (96.7 kg)   SpO2 96%   BMI 34.41 kg/m   Wt Readings from Last 3 Encounters:  10/19/19 213 lb 3.2 oz (96.7 kg)  07/19/19 202 lb 13.2 oz (92 kg)  05/31/19 222 lb (100.7 kg)    Physical Exam Vitals reviewed.  Constitutional:      General: She is not in acute distress.    Appearance: Normal appearance. She is obese. She is not ill-appearing, toxic-appearing or diaphoretic.  HENT:     Head: Normocephalic and atraumatic.  Eyes:     General: No scleral icterus.       Right eye: No discharge.        Left eye: No discharge.     Conjunctiva/sclera: Conjunctivae normal.  Cardiovascular:     Rate and Rhythm: Normal rate and regular rhythm.     Heart sounds: Normal heart sounds. No murmur heard.  No friction rub. No gallop.   Pulmonary:     Effort: Pulmonary effort is normal. No respiratory distress.     Breath sounds: Normal breath sounds. No stridor. No wheezing, rhonchi or rales.  Musculoskeletal:        General: Normal range of motion.     Cervical back: Normal range of motion.  Skin:    General: Skin is warm and dry.     Capillary Refill: Capillary refill takes less than 2 seconds.  Neurological:     General: No focal deficit present.     Mental Status: She is alert and oriented to person, place, and time. Mental status is at baseline.  Psychiatric:        Mood and Affect: Mood normal.         Behavior: Behavior normal.        Thought Content: Thought content normal.        Judgment: Judgment normal.     Lab Results  Component Value Date   TSH 2.350 11/24/2018   Lab Results  Component Value Date   WBC 8.9 07/17/2019   HGB 14.6 07/17/2019   HCT 43.3 07/17/2019   MCV 87.5 07/17/2019   PLT 245 07/17/2019   Lab Results  Component Value Date   NA 143 10/19/2019   K 4.0 10/19/2019   CO2 25 10/19/2019   GLUCOSE 111 (H) 10/19/2019   BUN 17 10/19/2019   CREATININE 1.02 (H) 10/19/2019   BILITOT 0.3 10/19/2019   ALKPHOS 79 10/19/2019   AST 20 10/19/2019   ALT 23 10/19/2019   PROT 6.0 10/19/2019   ALBUMIN 3.8 10/19/2019   CALCIUM 9.1 10/19/2019   ANIONGAP 13 07/17/2019   Lab Results  Component Value Date   CHOL 146 05/31/2019   Lab Results  Component Value Date   HDL 39 (L) 05/31/2019   Lab Results  Component Value Date   LDLCALC 83 05/31/2019   Lab Results  Component Value Date   TRIG 132 05/31/2019   Lab Results  Component Value Date   CHOLHDL 3.7 05/31/2019   Lab Results  Component Value Date   HGBA1C 5.6 10/19/2019

## 2019-10-20 LAB — CMP14+EGFR
ALT: 23 IU/L (ref 0–32)
AST: 20 IU/L (ref 0–40)
Albumin/Globulin Ratio: 1.7 (ref 1.2–2.2)
Albumin: 3.8 g/dL (ref 3.8–4.8)
Alkaline Phosphatase: 79 IU/L (ref 48–121)
BUN/Creatinine Ratio: 17 (ref 12–28)
BUN: 17 mg/dL (ref 8–27)
Bilirubin Total: 0.3 mg/dL (ref 0.0–1.2)
CO2: 25 mmol/L (ref 20–29)
Calcium: 9.1 mg/dL (ref 8.7–10.3)
Chloride: 107 mmol/L — ABNORMAL HIGH (ref 96–106)
Creatinine, Ser: 1.02 mg/dL — ABNORMAL HIGH (ref 0.57–1.00)
GFR calc Af Amer: 65 mL/min/{1.73_m2} (ref >59)
GFR calc non Af Amer: 56 mL/min/{1.73_m2} — ABNORMAL LOW (ref >59)
Globulin, Total: 2.2 g/dL (ref 1.5–4.5)
Glucose: 111 mg/dL — ABNORMAL HIGH (ref 65–99)
Potassium: 4 mmol/L (ref 3.5–5.2)
Sodium: 143 mmol/L (ref 134–144)
Total Protein: 6 g/dL (ref 6.0–8.5)

## 2019-10-20 LAB — VITAMIN D 25 HYDROXY (VIT D DEFICIENCY, FRACTURES): Vit D, 25-Hydroxy: 35.9 ng/mL (ref 30.0–100.0)

## 2019-10-21 DIAGNOSIS — G629 Polyneuropathy, unspecified: Secondary | ICD-10-CM | POA: Insufficient documentation

## 2019-11-21 ENCOUNTER — Emergency Department (HOSPITAL_COMMUNITY)
Admission: EM | Admit: 2019-11-21 | Discharge: 2019-11-22 | Disposition: A | Discharge status: Home / Self Care | Payer: Medicare Other | Attending: Emergency Medicine | Admitting: Emergency Medicine

## 2019-11-21 ENCOUNTER — Encounter (HOSPITAL_COMMUNITY): Payer: Self-pay | Admitting: Emergency Medicine

## 2019-11-21 ENCOUNTER — Other Ambulatory Visit: Payer: Self-pay

## 2019-11-21 DIAGNOSIS — S199XXA Unspecified injury of neck, initial encounter: Secondary | ICD-10-CM | POA: Diagnosis not present

## 2019-11-21 DIAGNOSIS — S0083XA Contusion of other part of head, initial encounter: Secondary | ICD-10-CM | POA: Diagnosis not present

## 2019-11-21 DIAGNOSIS — W19XXXA Unspecified fall, initial encounter: Secondary | ICD-10-CM

## 2019-11-21 DIAGNOSIS — S0012XA Contusion of left eyelid and periocular area, initial encounter: Secondary | ICD-10-CM | POA: Insufficient documentation

## 2019-11-21 DIAGNOSIS — R52 Pain, unspecified: Secondary | ICD-10-CM | POA: Diagnosis not present

## 2019-11-21 DIAGNOSIS — Z79899 Other long term (current) drug therapy: Secondary | ICD-10-CM | POA: Diagnosis not present

## 2019-11-21 DIAGNOSIS — Z87891 Personal history of nicotine dependence: Secondary | ICD-10-CM | POA: Insufficient documentation

## 2019-11-21 DIAGNOSIS — W01198A Fall on same level from slipping, tripping and stumbling with subsequent striking against other object, initial encounter: Secondary | ICD-10-CM | POA: Insufficient documentation

## 2019-11-21 DIAGNOSIS — M160 Bilateral primary osteoarthritis of hip: Secondary | ICD-10-CM | POA: Diagnosis not present

## 2019-11-21 DIAGNOSIS — M549 Dorsalgia, unspecified: Secondary | ICD-10-CM | POA: Diagnosis not present

## 2019-11-21 DIAGNOSIS — G20 Parkinson's disease: Secondary | ICD-10-CM | POA: Insufficient documentation

## 2019-11-21 DIAGNOSIS — Y9389 Activity, other specified: Secondary | ICD-10-CM | POA: Diagnosis not present

## 2019-11-21 DIAGNOSIS — M47816 Spondylosis without myelopathy or radiculopathy, lumbar region: Secondary | ICD-10-CM | POA: Diagnosis not present

## 2019-11-21 DIAGNOSIS — M791 Myalgia, unspecified site: Secondary | ICD-10-CM | POA: Diagnosis not present

## 2019-11-21 DIAGNOSIS — Z7951 Long term (current) use of inhaled steroids: Secondary | ICD-10-CM | POA: Insufficient documentation

## 2019-11-21 DIAGNOSIS — R0902 Hypoxemia: Secondary | ICD-10-CM | POA: Diagnosis not present

## 2019-11-21 DIAGNOSIS — I959 Hypotension, unspecified: Secondary | ICD-10-CM | POA: Diagnosis not present

## 2019-11-21 DIAGNOSIS — S6991XA Unspecified injury of right wrist, hand and finger(s), initial encounter: Secondary | ICD-10-CM | POA: Diagnosis not present

## 2019-11-21 DIAGNOSIS — J449 Chronic obstructive pulmonary disease, unspecified: Secondary | ICD-10-CM | POA: Insufficient documentation

## 2019-11-21 DIAGNOSIS — Y929 Unspecified place or not applicable: Secondary | ICD-10-CM | POA: Diagnosis not present

## 2019-11-21 DIAGNOSIS — E1140 Type 2 diabetes mellitus with diabetic neuropathy, unspecified: Secondary | ICD-10-CM | POA: Diagnosis not present

## 2019-11-21 DIAGNOSIS — R220 Localized swelling, mass and lump, head: Secondary | ICD-10-CM | POA: Diagnosis not present

## 2019-11-21 DIAGNOSIS — I5032 Chronic diastolic (congestive) heart failure: Secondary | ICD-10-CM | POA: Insufficient documentation

## 2019-11-21 DIAGNOSIS — I110 Hypertensive heart disease with heart failure: Secondary | ICD-10-CM | POA: Insufficient documentation

## 2019-11-21 DIAGNOSIS — S0990XA Unspecified injury of head, initial encounter: Secondary | ICD-10-CM | POA: Diagnosis present

## 2019-11-21 DIAGNOSIS — S3993XA Unspecified injury of pelvis, initial encounter: Secondary | ICD-10-CM | POA: Diagnosis not present

## 2019-11-21 DIAGNOSIS — J90 Pleural effusion, not elsewhere classified: Secondary | ICD-10-CM | POA: Diagnosis not present

## 2019-11-21 DIAGNOSIS — R001 Bradycardia, unspecified: Secondary | ICD-10-CM | POA: Diagnosis not present

## 2019-11-21 DIAGNOSIS — S0510XA Contusion of eyeball and orbital tissues, unspecified eye, initial encounter: Secondary | ICD-10-CM | POA: Diagnosis not present

## 2019-11-21 DIAGNOSIS — Y999 Unspecified external cause status: Secondary | ICD-10-CM | POA: Diagnosis not present

## 2019-11-21 NOTE — ED Triage Notes (Signed)
Pt's cat tripped her and she fell. Pt has lac to the right wrist and swelling to the left side of face.

## 2019-11-22 ENCOUNTER — Emergency Department (HOSPITAL_COMMUNITY): Payer: Medicare Other

## 2019-11-22 DIAGNOSIS — M160 Bilateral primary osteoarthritis of hip: Secondary | ICD-10-CM | POA: Diagnosis not present

## 2019-11-22 DIAGNOSIS — S199XXA Unspecified injury of neck, initial encounter: Secondary | ICD-10-CM | POA: Diagnosis not present

## 2019-11-22 DIAGNOSIS — S0990XA Unspecified injury of head, initial encounter: Secondary | ICD-10-CM | POA: Diagnosis not present

## 2019-11-22 DIAGNOSIS — S6991XA Unspecified injury of right wrist, hand and finger(s), initial encounter: Secondary | ICD-10-CM | POA: Diagnosis not present

## 2019-11-22 DIAGNOSIS — S0510XA Contusion of eyeball and orbital tissues, unspecified eye, initial encounter: Secondary | ICD-10-CM | POA: Diagnosis not present

## 2019-11-22 DIAGNOSIS — J90 Pleural effusion, not elsewhere classified: Secondary | ICD-10-CM | POA: Diagnosis not present

## 2019-11-22 DIAGNOSIS — S3993XA Unspecified injury of pelvis, initial encounter: Secondary | ICD-10-CM | POA: Diagnosis not present

## 2019-11-22 DIAGNOSIS — R220 Localized swelling, mass and lump, head: Secondary | ICD-10-CM | POA: Diagnosis not present

## 2019-11-22 DIAGNOSIS — M47816 Spondylosis without myelopathy or radiculopathy, lumbar region: Secondary | ICD-10-CM | POA: Diagnosis not present

## 2019-11-22 NOTE — ED Notes (Signed)
Pt ambulatory to restroom to change out of gown. Pt tolerating PO

## 2019-11-22 NOTE — ED Provider Notes (Signed)
[facility] EMERGENCY DEPARTMENT Provider Note   CSN: [id number] Arrival date & time: 11/21/19  2138     History Chief Complaint  Patient presents with  . Fall    Allison Rollins is a 69 y.o. female.  Patient here with fall.  States her cat tripped her as she was attempting get off a porch swing and she fell onto the wooden porch.  She struck her head on the railing and injured her right wrist.  Denies losing consciousness.  Complains of pain and swelling to her left face around her left eye as well as headache and right wrist pain.  Also has some low back pain which is chronic.  States her vision is normal.  Denies any blood thinners other than aspirin.  States she has a history of COPD, diabetes, hypertension, Parkinson's disease, CHF.  Denies any loss of consciousness.  States this is a mechanical fall.  No preceding dizziness or lightheadedness.  No chest pain or shortness of breath. States her vision is normal in the left eye.  Her tetanus shot is up-to-date.  The history is provided by the patient and the EMS personnel.  Fall Associated symptoms include headaches. Pertinent negatives include no abdominal pain and no shortness of breath.       Past Medical History:  Diagnosis Date  . Anxiety   . Asthma   . Chest pain 12/22/2015  . CHF (congestive heart failure) (HCC)   . COPD (chronic obstructive pulmonary disease) (HCC)   . Depression   . Diabetes mellitus without complication (HCC)   . Headache   . HTN (hypertension)   . Hypoglycemia   . Hypokalemia 07/28/2013  . Kidney stones   . Parkinson's disease   . Pneumonia   . Pre-diabetes   . S/P colonoscopy 10/15/04   normal    Patient Active Problem List   Diagnosis Date Noted  . Neuropathy 10/21/2019  . 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 Achalasia of esophagus   . Vitamin D deficiency 11/24/2018  . Restless leg 11/24/2018  . Erosive gastritis   . Seasonal allergic rhinitis due to pollen 07/20/2018  . Osteopenia of  forearm 03/04/2017  . Recurrent depression (HCC) 12/26/2015  . Generalized anxiety disorder 03/12/2015  . Prediabetes 03/12/2015  . Back pain 03/12/2015  . Stress incontinence 01/06/2015  . Chronic diastolic heart failure (HCC) 01/03/2015  . Obesity (BMI 30.0-34.9) 07/01/2011  . Hyperlipidemia associated with type 2 diabetes mellitus (HCC) 07/01/2011  . GERD (gastroesophageal reflux disease) 09/08/2010    Past Surgical History:  Procedure Laterality Date  . CHOLECYSTECTOMY    . COLONOSCOPY  10/15/2004   Normal rectum/Normal colon  . COLONOSCOPY N/A 05/10/2016   Procedure: COLONOSCOPY;  Surgeon: West Bali, MD;  Location: [facility];  Service: Endoscopy;  Laterality: N/A;  10:30  . ESOPHAGEAL MANOMETRY N/A 04/02/2019   Procedure: ESOPHAGEAL MANOMETRY (EM);  Surgeon: Napoleon Form, MD;  Location: [facility];  Service: Endoscopy;  Laterality: N/A;  . ESOPHAGOGASTRODUODENOSCOPY  09/11/2010   Dysphagia likely multifactorial (possible Candida esophagitis, likely nonspecific esophageal motility disorder, and/or uncontrolled gastroesophageal reflux disease), status post empiric dilation  . ESOPHAGOGASTRODUODENOSCOPY N/A 11/13/2018   Dr. Darrick Penna: No endoscopic esophageal abnormality to explain patient's dysphagia.  Esophagus dilated.  Moderate erosive/nodular gastritis with benign biopsies.  Marland Kitchen KIDNEY STONE SURGERY    . SAVORY DILATION N/A 11/13/2018   Procedure: SAVORY DILATION;  Surgeon: West Bali, MD;  Location: [facility];  Service: Endoscopy;  Laterality: N/A;  OB History    Gravida      Para      Term      Preterm      AB      Living  2     SAB      TAB      Ectopic      Multiple      Live Births              Family History  Problem Relation Age of Onset  . Coronary artery disease Father   . COPD Mother   . Asthma Brother   . Coronary artery disease Sister   . Diabetes Sister   . Coronary artery disease Brother   . Coronary artery  disease Sister     Social History   Tobacco Use  . Smoking status: Former Smoker    Packs/day: 0.25    Years: 30.00    Pack years: 7.50    Types: Cigarettes    Start date: 01/04/1965    Quit date: 01/03/1995    Years since quitting: 24.9  . Smokeless tobacco: Never Used  Vaping Use  . Vaping Use: Never used  Substance Use Topics  . Alcohol use: No    Alcohol/week: 0.0 standard drinks  . Drug use: No    Home Medications Prior to Admission medications   Medication Sig Start Date End Date Taking? Authorizing Provider  acetaminophen (TYLENOL) 500 MG tablet Take 500-1,000 mg by mouth every 6 (six) hours as needed for moderate pain (pain.).     [provider]  albuterol (PROVENTIL HFA;VENTOLIN HFA) 108 (90 Base) MCG/ACT inhaler Inhale 2 puffs into the lungs every 6 (six) hours as needed for wheezing. 01/12/18   Sonny Masters, FNP  albuterol (PROVENTIL) (2.5 MG/3ML) 0.083% nebulizer solution Take 3 mLs (2.5 mg total) by nebulization every 6 (six) hours as needed for wheezing or shortness of breath. 01/12/18   Sonny Masters, FNP  Alcohol Swabs (B-D SINGLE USE SWABS REGULAR) PADS Test sugars daily 05/24/18   Sonny Masters, FNP  aspirin EC 81 MG tablet Take 1 tablet (81 mg total) by mouth daily. 12/23/15   Erick Blinks, MD  atorvastatin (LIPITOR) 40 MG tablet Take 1 tablet (40 mg total) by mouth daily. 10/19/19   Gwenlyn Fudge, FNP  cetirizine (ZYRTEC) 10 MG tablet Take 1 tablet (10 mg total) by mouth daily. 10/19/19   Gwenlyn Fudge, FNP  cholecalciferol (VITAMIN D3) 25 MCG (1000 UT) tablet Take 1,000 Units by mouth 2 (two) times daily.     [provider]  fluticasone (FLONASE) 50 MCG/ACT nasal spray Place 2 sprays into both nostrils daily. 10/16/19   Gwenlyn Fudge, FNP  furosemide (LASIX) 20 MG tablet Take 1 tablet (20 mg total) by mouth daily as needed. 10/19/19   Gwenlyn Fudge, FNP  gabapentin (NEURONTIN) 300 MG capsule Take 1 capsule (300 mg total) by mouth in  the morning AND 2 capsules (600 mg total) at bedtime. 10/19/19   Gwenlyn Fudge, FNP  glucose blood (ACCU-CHEK AVIVA PLUS) test strip Test sugars daily 06/07/18   Sonny Masters, FNP  HYDROcodone-acetaminophen (NORCO/VICODIN) 5-325 MG tablet Take 1 tablet by mouth 3 (three) times daily as needed. 05/23/19   [provider]  lisinopril (ZESTRIL) 10 MG tablet Take 1 tablet (10 mg total) by mouth daily. 10/19/19   Gwenlyn Fudge, FNP  Multiple Vitamin (MULTIVITAMIN WITH MINERALS) TABS tablet Take 1 tablet by  mouth daily.    [provider]  nitroGLYCERIN (NITROSTAT) 0.4 MG SL tablet Place 1 tablet (0.4 mg total) under the tongue every 5 (five) minutes as needed for chest pain. 12/25/15   Johna SheriffVincent, Carol L, MD  nystatin cream (MYCOSTATIN) Apply 1 application topically twice daily 10/09/19   Gwenlyn FudgeJoyce, Britney F, FNP  Omega-3 Fatty Acids (FISH OIL PO) Take 1 capsule by mouth daily.     [provider]  pantoprazole (PROTONIX) 40 MG tablet TAKE ONE TABLET BY MOUTH TWICE DAILY 03/19/19   Tiffany KocherLewis, Leslie S, PA-C  potassium chloride SA (K-DUR) 20 MEQ tablet Take 20 mEq by mouth daily as needed (with lasix).    [provider]  pramipexole (MIRAPEX) 0.5 MG tablet Take 1 tablet (0.5 mg total) by mouth at bedtime. 10/19/19   Gwenlyn FudgeJoyce, Britney F, FNP  sertraline (ZOLOFT) 100 MG tablet Take 1 tablet (100 mg total) by mouth daily. 10/19/19   Gwenlyn FudgeJoyce, Britney F, FNP    Allergies    Penicillins and Sulfa antibiotics  Review of Systems   Review of Systems  Constitutional: Negative for activity change, appetite change and fever.  HENT: Negative for congestion and rhinorrhea.   Eyes: Negative for visual disturbance.  Respiratory: Negative for cough, chest tightness and shortness of breath.   Gastrointestinal: Negative for abdominal pain, nausea and vomiting.  Genitourinary: Negative for dysuria.  Musculoskeletal: Positive for arthralgias, back pain and myalgias.  Skin: Negative for rash.   Neurological: Positive for headaches. Negative for dizziness, weakness and light-headedness.   all other systems are negative except as noted in the HPI and PMH.    Physical Exam Updated Vital Signs BP (!) 139/71   Pulse 53   Temp 98.9 F (37.2 C) (Oral)   Resp 16   Ht 5\' 6"  (1.676 m)   Wt (!) 100.7 kg   SpO2 94%   BMI 35.83 kg/m   Physical Exam Vitals and nursing note reviewed.  Constitutional:      General: She is not in acute distress.    Appearance: She is well-developed.  HENT:     Head: Normocephalic.     Comments: Left periborbital ecchymosis and edema.  Extraocular movements are intact.    Mouth/Throat:     Pharynx: No oropharyngeal exudate.  Eyes:     Conjunctiva/sclera: Conjunctivae normal.     Pupils: Pupils are equal, round, and reactive to light.  Neck:     Comments: No meningismus. No C-spine tenderness Cardiovascular:     Rate and Rhythm: Normal rate and regular rhythm.     Heart sounds: Normal heart sounds. No murmur heard.   Pulmonary:     Effort: Pulmonary effort is normal. No respiratory distress.     Breath sounds: Normal breath sounds.  Abdominal:     Palpations: Abdomen is soft.     Tenderness: There is no abdominal tenderness. There is no guarding or rebound.  Musculoskeletal:        General: Tenderness present. Normal range of motion.     Cervical back: Normal range of motion and neck supple.     Comments: Right paraspinal lumbar tenderness, no midline tenderness  Abrasion right wrist without bony deformity  Full range of motion hips without pain  Skin:    General: Skin is warm.  Neurological:     Mental Status: She is alert and oriented to person, place, and time.     Cranial Nerves: No cranial nerve deficit.     Motor: No abnormal  muscle tone.     Coordination: Coordination normal.     Comments: No ataxia on finger to nose bilaterally. No pronator drift. 5/5 strength throughout. CN 2-12 intact.Equal grip strength. Sensation intact.    Psychiatric:        Behavior: Behavior normal.     ED Results / Procedures / Treatments   Labs (all labs ordered are listed, but only abnormal results are displayed) Labs Reviewed - No data to display  EKG EKG Interpretation  Date/Time:  Thursday November 22 2019 03:57:35 EDT Ventricular Rate:  56 PR Interval:  142 QRS Duration: 84 QT Interval:  466 QTC Calculation: 449 R Axis:   72 Text Interpretation: Sinus bradycardia with sinus arrhythmia Otherwise normal ECG No significant change was found Confirmed by Glynn Octave [phone]) on 11/22/2019 7:11:38 AM   Radiology DG Chest 2 View  Result Date: 11/22/2019 CLINICAL DATA:  Recent trip and fall with chest pain, initial encounter EXAM: CHEST - 2 VIEW COMPARISON:  07/17/2019 FINDINGS: Cardiac shadow is enlarged. Aortic calcifications are seen. The lungs are hyperinflated without focal infiltrate or sizable effusion. No bony abnormality is seen. IMPRESSION: No acute abnormality noted. Electronically Signed   By: Alcide Clever M.D.   On: 11/22/2019 02:14   DG Pelvis 1-2 Views  Result Date: 11/22/2019 CLINICAL DATA:  Recent trip and fall with pelvic pain, initial encounter EXAM: PELVIS - 1 VIEW COMPARISON:  None. FINDINGS: Pelvic ring is intact. Degenerative changes of the hip joints are noted. No acute fracture or dislocation is noted. Degenerative change of the lumbar spine is seen. IMPRESSION: Degenerative change without acute abnormality. Electronically Signed   By: Alcide Clever M.D.   On: 11/22/2019 02:15   DG Wrist Complete Right  Result Date: 11/22/2019 CLINICAL DATA:  Fall EXAM: RIGHT WRIST - COMPLETE 3+ VIEW COMPARISON:  None. FINDINGS: There is no evidence of fracture or dislocation. There is no evidence of arthropathy or other focal bone abnormality. Soft tissues are unremarkable. IMPRESSION: Negative. Electronically Signed   By: Jonna Clark M.D.   On: 11/22/2019 02:13   CT Head Wo Contrast  Result Date: 11/22/2019 CLINICAL  DATA:  Trip and fall EXAM: CT MAXILLOFACIAL WITHOUT CONTRAST; CT CERVICAL SPINE WITHOUT CONTRAST; CT HEAD WITHOUT CONTRAST TECHNIQUE: Multidetector CT imaging of the maxillofacial structures was performed. Multiplanar CT image reconstructions were also generated. COMPARISON:  None. FINDINGS: Brain: No evidence of acute territorial infarction, hemorrhage, hydrocephalus,extra-axial collection or mass lesion/mass effect. There is dilatation the ventricles and sulci consistent with age-related atrophy. Low-attenuation changes in the deep white matter consistent with small vessel ischemia. Vascular: No hyperdense vessel or unexpected calcification. Skull: The skull is intact. No fracture or focal lesion identified. Sinuses/Orbits: The visualized paranasal sinuses and mastoid air cells are clear. The orbits and globes intact. Other: Left-sided periorbital soft tissue hematoma and swelling is noted. There is also a small amount of swelling over the left frontal skull. Face: Osseous: No acute fracture or other significant osseous abnormality.The nasal bone, mandibles, zygomatic arches and pterygoid plates are intact. Orbits: No fracture identified. Unremarkable appearance of globes and orbits. Sinuses: The visualized paranasal sinuses and mastoid air cells are unremarkable. Soft tissues: Left-sided periorbital soft tissue hematoma and swelling is noted. A small amount of soft tissue swelling seen over the left frontal skull. Limited intracranial: No acute findings. Cervical spine: Alignment: Physiologic Skull base and vertebrae: Visualized skull base is intact. No atlanto-occipital dissociation. The vertebral body heights are well maintained. No fracture or pathologic osseous lesion  seen. Soft tissues and spinal canal: The visualized paraspinal soft tissues are unremarkable. No prevertebral soft tissue swelling is seen. The spinal canal is grossly unremarkable, no large epidural collection or significant canal narrowing.  Disc levels: Disc height loss with disc osteophyte complex and anterior osteophytes are most notable at C5-C6 with moderate right and mild left neural foraminal narrowing as well as mild central canal stenosis. Upper chest: The lung apices are clear. Thoracic inlet is within normal limits. Carotid artery calcifications are seen. Other: None IMPRESSION: No acute intracranial abnormality. Findings consistent with age related atrophy and chronic small vessel ischemia No acute facial fracture Left periorbital soft tissue hematoma and left frontal skull soft tissue swelling. No acute fracture or malalignment of the spine. Electronically Signed   By: Jonna Clark M.D.   On: 11/22/2019 02:23   CT Cervical Spine Wo Contrast  Result Date: 11/22/2019 CLINICAL DATA:  Trip and fall EXAM: CT MAXILLOFACIAL WITHOUT CONTRAST; CT CERVICAL SPINE WITHOUT CONTRAST; CT HEAD WITHOUT CONTRAST TECHNIQUE: Multidetector CT imaging of the maxillofacial structures was performed. Multiplanar CT image reconstructions were also generated. COMPARISON:  None. FINDINGS: Brain: No evidence of acute territorial infarction, hemorrhage, hydrocephalus,extra-axial collection or mass lesion/mass effect. There is dilatation the ventricles and sulci consistent with age-related atrophy. Low-attenuation changes in the deep white matter consistent with small vessel ischemia. Vascular: No hyperdense vessel or unexpected calcification. Skull: The skull is intact. No fracture or focal lesion identified. Sinuses/Orbits: The visualized paranasal sinuses and mastoid air cells are clear. The orbits and globes intact. Other: Left-sided periorbital soft tissue hematoma and swelling is noted. There is also a small amount of swelling over the left frontal skull. Face: Osseous: No acute fracture or other significant osseous abnormality.The nasal bone, mandibles, zygomatic arches and pterygoid plates are intact. Orbits: No fracture identified. Unremarkable appearance of  globes and orbits. Sinuses: The visualized paranasal sinuses and mastoid air cells are unremarkable. Soft tissues: Left-sided periorbital soft tissue hematoma and swelling is noted. A small amount of soft tissue swelling seen over the left frontal skull. Limited intracranial: No acute findings. Cervical spine: Alignment: Physiologic Skull base and vertebrae: Visualized skull base is intact. No atlanto-occipital dissociation. The vertebral body heights are well maintained. No fracture or pathologic osseous lesion seen. Soft tissues and spinal canal: The visualized paraspinal soft tissues are unremarkable. No prevertebral soft tissue swelling is seen. The spinal canal is grossly unremarkable, no large epidural collection or significant canal narrowing. Disc levels: Disc height loss with disc osteophyte complex and anterior osteophytes are most notable at C5-C6 with moderate right and mild left neural foraminal narrowing as well as mild central canal stenosis. Upper chest: The lung apices are clear. Thoracic inlet is within normal limits. Carotid artery calcifications are seen. Other: None IMPRESSION: No acute intracranial abnormality. Findings consistent with age related atrophy and chronic small vessel ischemia No acute facial fracture Left periorbital soft tissue hematoma and left frontal skull soft tissue swelling. No acute fracture or malalignment of the spine. Electronically Signed   By: Jonna Clark M.D.   On: 11/22/2019 02:23   CT Maxillofacial Wo Contrast  Result Date: 11/22/2019 CLINICAL DATA:  Trip and fall EXAM: CT MAXILLOFACIAL WITHOUT CONTRAST; CT CERVICAL SPINE WITHOUT CONTRAST; CT HEAD WITHOUT CONTRAST TECHNIQUE: Multidetector CT imaging of the maxillofacial structures was performed. Multiplanar CT image reconstructions were also generated. COMPARISON:  None. FINDINGS: Brain: No evidence of acute territorial infarction, hemorrhage, hydrocephalus,extra-axial collection or mass lesion/mass effect.  There is dilatation the ventricles  and sulci consistent with age-related atrophy. Low-attenuation changes in the deep white matter consistent with small vessel ischemia. Vascular: No hyperdense vessel or unexpected calcification. Skull: The skull is intact. No fracture or focal lesion identified. Sinuses/Orbits: The visualized paranasal sinuses and mastoid air cells are clear. The orbits and globes intact. Other: Left-sided periorbital soft tissue hematoma and swelling is noted. There is also a small amount of swelling over the left frontal skull. Face: Osseous: No acute fracture or other significant osseous abnormality.The nasal bone, mandibles, zygomatic arches and pterygoid plates are intact. Orbits: No fracture identified. Unremarkable appearance of globes and orbits. Sinuses: The visualized paranasal sinuses and mastoid air cells are unremarkable. Soft tissues: Left-sided periorbital soft tissue hematoma and swelling is noted. A small amount of soft tissue swelling seen over the left frontal skull. Limited intracranial: No acute findings. Cervical spine: Alignment: Physiologic Skull base and vertebrae: Visualized skull base is intact. No atlanto-occipital dissociation. The vertebral body heights are well maintained. No fracture or pathologic osseous lesion seen. Soft tissues and spinal canal: The visualized paraspinal soft tissues are unremarkable. No prevertebral soft tissue swelling is seen. The spinal canal is grossly unremarkable, no large epidural collection or significant canal narrowing. Disc levels: Disc height loss with disc osteophyte complex and anterior osteophytes are most notable at C5-C6 with moderate right and mild left neural foraminal narrowing as well as mild central canal stenosis. Upper chest: The lung apices are clear. Thoracic inlet is within normal limits. Carotid artery calcifications are seen. Other: None IMPRESSION: No acute intracranial abnormality. Findings consistent with age  related atrophy and chronic small vessel ischemia No acute facial fracture Left periorbital soft tissue hematoma and left frontal skull soft tissue swelling. No acute fracture or malalignment of the spine. Electronically Signed   By: Jonna Clark M.D.   On: 11/22/2019 02:23    Procedures Procedures (including critical care time)  Medications Ordered in ED Medications - No data to display  ED Course  I have reviewed the triage vital signs and the nursing notes.  Pertinent labs & imaging results that were available during my care of the patient were reviewed by me and considered in my medical decision making (see chart for details).    MDM Rules/Calculators/A&P                         Mechanical fall with head injury and right wrist injury.  Vitals are stable.  GCS is 15.  No blood thinner use other than aspirin.  Traumatic imaging is negative for acute abnormality.  No facial fracture.  No intracranial hemorrhage.  Patient describes mechanical fall.  No loss of consciousness.  EKG is unchanged sinus bradycardia.  No preceding dizziness or lightheadedness.  Discussed anti-inflammatories, ice, rest.  Patient with no visual abnormalities and no signs of entrapment of her extraocular muscles.  Advised PCP follow-up.  Return precautions discussed.  Final Clinical Impression(s) / ED Diagnoses Final diagnoses:  Fall, initial encounter  Contusion of face, initial encounter    Rx / DC Orders ED Discharge Orders    None       Kalila Adkison, Jeannett Senior, MD 11/22/19 [phone]

## 2019-11-22 NOTE — Discharge Instructions (Signed)
Your CT scan shows no broken bones or significant injury inside of your head.  Use ice your black eye and Tylenol and Motrin as needed.  Follow-up with your doctor.  Return to the ED with new or worsening symptoms.

## 2019-11-23 ENCOUNTER — Telehealth: Payer: Self-pay | Admitting: Family Medicine

## 2019-11-23 NOTE — Telephone Encounter (Signed)
Pt is scheduled to see Britney 11/27/19 at 3:05. Pt is made aware.

## 2019-11-23 NOTE — Telephone Encounter (Signed)
Pt went to the ER Wednesday for a fall. Told to follow-up with provider in 2 days. Please advise.

## 2019-11-27 ENCOUNTER — Encounter: Payer: Self-pay | Admitting: Family Medicine

## 2019-11-27 ENCOUNTER — Other Ambulatory Visit: Payer: Self-pay

## 2019-11-27 ENCOUNTER — Ambulatory Visit (INDEPENDENT_AMBULATORY_CARE_PROVIDER_SITE_OTHER): Payer: Medicare Other | Admitting: Family Medicine

## 2019-11-27 VITALS — BP 140/74 | HR 53 | Temp 97.9°F | Ht 66.0 in | Wt 215.2 lb

## 2019-11-27 DIAGNOSIS — S60911A Unspecified superficial injury of right wrist, initial encounter: Secondary | ICD-10-CM

## 2019-11-27 DIAGNOSIS — Z9181 History of falling: Secondary | ICD-10-CM | POA: Diagnosis not present

## 2019-11-27 DIAGNOSIS — T148XXA Other injury of unspecified body region, initial encounter: Secondary | ICD-10-CM

## 2019-11-27 NOTE — Progress Notes (Signed)
Assessment & Plan:  1. History of recent fall - Now using walker when she goes outside.   2. Bruising - Arnicare gel for bruising.    Follow up plan: Return as scheduled.  Allison Boston, MSN, APRN, FNP-C [facility]  Subjective:   Patient ID: Allison Rollins, female    DOB: 09/29/1950, 69 y.o.   MRN: [id number]  HPI: Allison Rollins is a 69 y.o. female presenting on 11/27/2019 for ER follow up (7/28 [facility]- fall)  Patient was seen at [facility] ER on 11/21/2019 after a fall on her wooden porch. She struck her head and injured her right wrist. All imaging was negative. She was discharged with instructions to take NSAIDs, use ice, and rest.   She reports today she is feeling well. She is now using a walker when going outside.    ROS: Negative unless specifically indicated above in HPI.   Relevant past medical history reviewed and updated as indicated.   Allergies and medications reviewed and updated.   Current Outpatient Medications:  .  acetaminophen (TYLENOL) 500 MG tablet, Take 500-1,000 mg by mouth every 6 (six) hours as needed for moderate pain (pain.). , Disp: , Rfl:  .  albuterol (PROVENTIL HFA;VENTOLIN HFA) 108 (90 Base) MCG/ACT inhaler, Inhale 2 puffs into the lungs every 6 (six) hours as needed for wheezing., Disp: 1 Inhaler, Rfl: 0 .  albuterol (PROVENTIL) (2.5 MG/3ML) 0.083% nebulizer solution, Take 3 mLs (2.5 mg total) by nebulization every 6 (six) hours as needed for wheezing or shortness of breath., Disp: 150 mL, Rfl: 1 .  Alcohol Swabs (B-D SINGLE USE SWABS REGULAR) PADS, Test sugars daily, Disp: 100 each, Rfl: 3 .  aspirin EC 81 MG tablet, Take 1 tablet (81 mg total) by mouth daily., Disp: , Rfl:  .  atorvastatin (LIPITOR) 40 MG tablet, Take 1 tablet (40 mg total) by mouth daily., Disp: 90 tablet, Rfl: 1 .  cetirizine (ZYRTEC) 10 MG tablet, Take 1 tablet (10 mg total) by mouth daily., Disp: 90 tablet, Rfl: 1 .  cholecalciferol (VITAMIN D3) 25 MCG  (1000 UT) tablet, Take 1,000 Units by mouth 2 (two) times daily. , Disp: , Rfl:  .  fluticasone (FLONASE) 50 MCG/ACT nasal spray, Place 2 sprays into both nostrils daily., Disp: 16 g, Rfl: 6 .  furosemide (LASIX) 20 MG tablet, Take 1 tablet (20 mg total) by mouth daily as needed., Disp: 90 tablet, Rfl: 1 .  gabapentin (NEURONTIN) 300 MG capsule, Take 1 capsule (300 mg total) by mouth in the morning AND 2 capsules (600 mg total) at bedtime., Disp: 270 capsule, Rfl: 1 .  glucose blood (ACCU-CHEK AVIVA PLUS) test strip, Test sugars daily, Disp: 100 each, Rfl: 3 .  lisinopril (ZESTRIL) 10 MG tablet, Take 1 tablet (10 mg total) by mouth daily., Disp: 90 tablet, Rfl: 1 .  Multiple Vitamin (MULTIVITAMIN WITH MINERALS) TABS tablet, Take 1 tablet by mouth daily., Disp: , Rfl:  .  nitroGLYCERIN (NITROSTAT) 0.4 MG SL tablet, Place 1 tablet (0.4 mg total) under the tongue every 5 (five) minutes as needed for chest pain., Disp: 20 tablet, Rfl: 3 .  nystatin cream (MYCOSTATIN), Apply 1 application topically twice daily, Disp: 30 g, Rfl: 0 .  Omega-3 Fatty Acids (FISH OIL PO), Take 1 capsule by mouth daily. , Disp: , Rfl:  .  pantoprazole (PROTONIX) 40 MG tablet, TAKE ONE TABLET BY MOUTH TWICE DAILY, Disp: 60 tablet, Rfl: 11 .  potassium chloride SA (  K-DUR) 20 MEQ tablet, Take 20 mEq by mouth daily as needed (with lasix)., Disp: , Rfl:  .  pramipexole (MIRAPEX) 0.5 MG tablet, Take 1 tablet (0.5 mg total) by mouth at bedtime., Disp: 90 tablet, Rfl: 1 .  sertraline (ZOLOFT) 100 MG tablet, Take 1 tablet (100 mg total) by mouth daily., Disp: 90 tablet, Rfl: 1  Allergies  Allergen Reactions  . Penicillins Itching and Rash    Has patient had a PCN reaction causing immediate rash, facial/tongue/throat swelling, SOB or lightheadedness with hypotension: no Has patient had a PCN reaction causing severe rash involving mucus membranes or skin necrosis: no Has patient had a PCN reaction that required hospitalization:  no Has patient had a PCN reaction occurring within the last 10 years: no If all of the above answers are "NO", then may proceed with Cephalosporin use.   . Sulfa Antibiotics Rash    Objective:   BP 140/74   Pulse (!) 53   Temp 97.9 F (36.6 C) (Temporal)   Ht 5\' 6"  (1.676 m)   Wt 215 lb 3.2 oz (97.6 kg)   SpO2 92%   BMI 34.73 kg/m    Physical Exam Vitals reviewed.  Constitutional:      General: She is not in acute distress.    Appearance: Normal appearance. She is not ill-appearing, toxic-appearing or diaphoretic.  HENT:     Head: Normocephalic and atraumatic.  Eyes:     General: No scleral icterus.       Right eye: No discharge.        Left eye: No discharge.     Conjunctiva/sclera: Conjunctivae normal.  Cardiovascular:     Rate and Rhythm: Normal rate.  Pulmonary:     Effort: Pulmonary effort is normal. No respiratory distress.  Musculoskeletal:        General: Normal range of motion.     Cervical back: Normal range of motion.  Skin:    General: Skin is warm and dry.     Capillary Refill: Capillary refill takes less than 2 seconds.     Findings: Bruising (face, right wrist, left shoulder) present.  Neurological:     General: No focal deficit present.     Mental Status: She is alert and oriented to person, place, and time. Mental status is at baseline.  Psychiatric:        Mood and Affect: Mood normal.        Behavior: Behavior normal.        Thought Content: Thought content normal.        Judgment: Judgment normal.

## 2019-11-27 NOTE — Patient Instructions (Signed)
Arnicare Gel for bruising.

## 2019-12-09 ENCOUNTER — Other Ambulatory Visit: Payer: Self-pay | Admitting: Family Medicine

## 2019-12-09 DIAGNOSIS — B372 Candidiasis of skin and nail: Secondary | ICD-10-CM

## 2019-12-11 ENCOUNTER — Other Ambulatory Visit: Payer: Self-pay | Admitting: *Deleted

## 2019-12-11 NOTE — Telephone Encounter (Signed)
Closing encounter, finished in electronic request

## 2020-01-11 ENCOUNTER — Telehealth: Payer: Self-pay | Admitting: Family Medicine

## 2020-01-11 DIAGNOSIS — M545 Low back pain, unspecified: Secondary | ICD-10-CM

## 2020-01-11 DIAGNOSIS — G8929 Other chronic pain: Secondary | ICD-10-CM

## 2020-01-11 NOTE — Telephone Encounter (Signed)
No orders or notes regarding this test. Can I confirm that you did not order?

## 2020-01-11 NOTE — Telephone Encounter (Signed)
Patient is also requesting order for back brace.

## 2020-01-11 NOTE — Telephone Encounter (Signed)
I did not order that test. Why does she feel she needs a back brace?

## 2020-01-14 ENCOUNTER — Other Ambulatory Visit: Payer: Self-pay | Admitting: *Deleted

## 2020-01-14 DIAGNOSIS — J301 Allergic rhinitis due to pollen: Secondary | ICD-10-CM

## 2020-01-14 NOTE — Telephone Encounter (Signed)
Patient states she would like it faxed to [facility] pharmacy.  Rx faxed and patient aware.

## 2020-01-14 NOTE — Telephone Encounter (Signed)
Patient states she has back pain for over 2 years off and on. She has had MRI for it in the past that showed bulging disk and nerves pressing.

## 2020-01-14 NOTE — Telephone Encounter (Signed)
I put in an order for DME (back brace) - where would she like it sent?

## 2020-01-14 NOTE — Telephone Encounter (Signed)
Pt wants back brace sent to [facility]. Pt wants a call when its done

## 2020-01-14 NOTE — Telephone Encounter (Signed)
Pt said she called [facility] and still does not have rx for back brace. Now she is saying that [place] on has OTC and not the one that insurance will pay for. Where can the rx be faxed to so that insurance will pay? [facility]?

## 2020-01-14 NOTE — Telephone Encounter (Signed)
Re faxed as patient requested- pt aware.

## 2020-01-14 NOTE — Telephone Encounter (Signed)
Patient aware rx has been faxed to [facility] pharmacy and she will call there to get price.

## 2020-01-14 NOTE — Telephone Encounter (Signed)
Patient aware and verbalized understanding. °

## 2020-01-22 ENCOUNTER — Encounter: Payer: Self-pay | Admitting: Family Medicine

## 2020-01-22 ENCOUNTER — Ambulatory Visit (INDEPENDENT_AMBULATORY_CARE_PROVIDER_SITE_OTHER): Payer: Medicare Other | Admitting: Family Medicine

## 2020-01-22 ENCOUNTER — Ambulatory Visit (INDEPENDENT_AMBULATORY_CARE_PROVIDER_SITE_OTHER): Payer: Medicare Other

## 2020-01-22 ENCOUNTER — Other Ambulatory Visit: Payer: Self-pay

## 2020-01-22 VITALS — BP 134/76 | HR 67 | Temp 98.3°F | Ht 66.0 in | Wt 223.8 lb

## 2020-01-22 DIAGNOSIS — R7303 Prediabetes: Secondary | ICD-10-CM | POA: Diagnosis not present

## 2020-01-22 DIAGNOSIS — E782 Mixed hyperlipidemia: Secondary | ICD-10-CM

## 2020-01-22 DIAGNOSIS — F339 Major depressive disorder, recurrent, unspecified: Secondary | ICD-10-CM | POA: Diagnosis not present

## 2020-01-22 DIAGNOSIS — Z780 Asymptomatic menopausal state: Secondary | ICD-10-CM | POA: Diagnosis not present

## 2020-01-22 DIAGNOSIS — M8580 Other specified disorders of bone density and structure, unspecified site: Secondary | ICD-10-CM

## 2020-01-22 DIAGNOSIS — Z23 Encounter for immunization: Secondary | ICD-10-CM | POA: Diagnosis not present

## 2020-01-22 DIAGNOSIS — E559 Vitamin D deficiency, unspecified: Secondary | ICD-10-CM

## 2020-01-22 DIAGNOSIS — I5032 Chronic diastolic (congestive) heart failure: Secondary | ICD-10-CM

## 2020-01-22 DIAGNOSIS — G8929 Other chronic pain: Secondary | ICD-10-CM

## 2020-01-22 DIAGNOSIS — M545 Low back pain, unspecified: Secondary | ICD-10-CM

## 2020-01-22 DIAGNOSIS — J301 Allergic rhinitis due to pollen: Secondary | ICD-10-CM

## 2020-01-22 DIAGNOSIS — G2581 Restless legs syndrome: Secondary | ICD-10-CM

## 2020-01-22 DIAGNOSIS — G629 Polyneuropathy, unspecified: Secondary | ICD-10-CM

## 2020-01-22 DIAGNOSIS — K2100 Gastro-esophageal reflux disease with esophagitis, without bleeding: Secondary | ICD-10-CM

## 2020-01-22 DIAGNOSIS — E1169 Type 2 diabetes mellitus with other specified complication: Secondary | ICD-10-CM | POA: Diagnosis not present

## 2020-01-22 DIAGNOSIS — E785 Hyperlipidemia, unspecified: Secondary | ICD-10-CM | POA: Diagnosis not present

## 2020-01-22 HISTORY — DX: Other specified disorders of bone density and structure, unspecified site: M85.80

## 2020-01-22 LAB — BAYER DCA HB A1C WAIVED: HB A1C (BAYER DCA - WAIVED): 5.6 % (ref <7.0)

## 2020-01-22 IMAGING — CR DEXA BONE DENSITY STUDY
1 series · 4 of 4 positions shown · non-contrast
Comparison: 02/28/2017.

CLINICAL DATA: Postmenopausal.

EXAM:
DUAL X-RAY ABSORPTIOMETRY (DXA) FOR BONE MINERAL DENSITY
TECHNIQUE: Bone mineral density measurements are performed of the spine, hip,
and forearm, as appropriate, per International Society of Clinical
Densitometry recommendations. The pertinent regions of interest are
reported below. Non-contributory values are not reported. Images are
obtained for bone mineral density measurement and are not obtained
for diagnostic purposes.

[Series 2: dxa images · 4 of 4 slices shown]
[im 1/4]
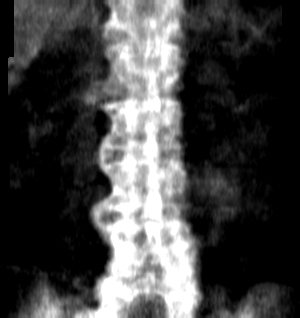
[im 2/4]
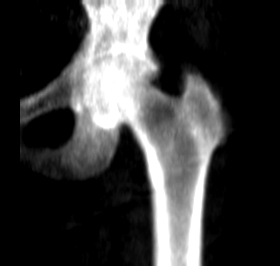
[im 3/4]
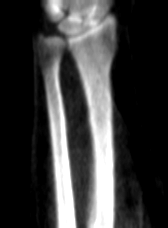
[im 4/4]
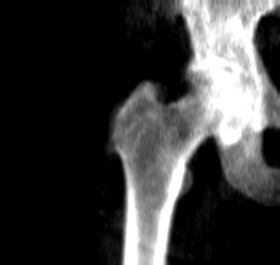

[4 of 4 positions shown; findings below may reference images not displayed]

FINDINGS: AP LUMBAR SPINE L1 through L3

Bone Mineral Density (BMD):  1.091 g/cm2

Young Adult T-Score:  -0.7

Z-Score:  -0.2

RIGHT FEMUR NECK

Bone Mineral Density (BMD):  0.948 g/cm2

Young Adult T-Score: -0.7

Z-Score:

LEFT FOREARM ([DATE] RADIUS)

Bone Mineral Density (BMD):

Young Adult T Score:  -1.1

Z Score:

Unit: This study was performed at [HOSPITAL]
on a GE Prodigy Advance.

Scan quality: The scan quality is good. Exclusions: L4 excluded due
to degenerative change.

ASSESSMENT: Patient's diagnostic category is LOW BONE
MASS/OSTEOPENIA by WHO Criteria.

FRACTURE RISK: INCREASED

FRAX: Based on the World Health Organization FRAX model, the 10 year
probability of a major osteoporotic fracture is 7.4%. The 10 year
probability of a hip fracture is 0.6%.
Since the prior study there has been a 3.3%
decrease in density for the lumbar spine, no statistically
significant change in bone density for the distal left forearm and a
5.8% decrease in density for the total mean proximal femurs.

RECOMMENDATIONS

1. All patients should optimize calcium and vitamin D intake.

2. Consider FDA-approved medical therapies in postmenopausal women
and men aged 50 years and older, based on the following:

- A hip or vertebral (clinical or morphometric) fracture

- T-score less than or equal to -2.5 at the femoral neck or spine
after appropriate evaluation to exclude secondary causes

- Low bone mass (T-score between -1.0 and -2.5 at the femoral neck
or spine) and a 10-year probability of a hip fracture greater than
or equal to 3% or a 10-year probability of a major
osteoporosis-related fracture greater than or equal to 20% based on
the [place]-adapted WHO algorithm

- Clinician judgment and/or patient preferences may indicate
treatment for people with 10-year fracture probabilities above or
below these levels

3. Patients with diagnosis of osteoporosis or at high risk for
fracture should have regular bone mineral density tests. For
patients eligible for Medicare, routine testing is allowed once
every 2 years. The testing frequency can be increased to one year
for patients who have rapidly progressing disease, those who are
receiving or discontinuing medical therapy to restore bone mass, or
have additional risk factors.

## 2020-01-22 MED ORDER — METHOCARBAMOL 500 MG PO TABS: 500.0000 mg | ORAL_TABLET | Freq: Three times a day (TID) | ORAL | 2 refills | Status: DC | PRN

## 2020-01-22 MED ORDER — SERTRALINE HCL 100 MG PO TABS: 150.0000 mg | ORAL_TABLET | Freq: Every day | ORAL | 1 refills | Status: DC

## 2020-01-22 NOTE — Progress Notes (Signed)
Assessment & Plan:  1. Recurrent depression ([place]) - Uncontrolled. Zoloft increased from 100 mg to 150 mg once daily.  - CMP14+EGFR - sertraline (ZOLOFT) 100 MG tablet; Take 1.5 tablets (150 mg total) by mouth daily.  Dispense: 135 tablet; Refill: 1  2. Prediabetes - Diet controlled.  - Bayer DCA Hb A1c Waived - Lipid panel - CMP14+EGFR  3. Chronic diastolic heart failure ([place]) - Well controlled on current regimen.  - CMP14+EGFR  4. Mixed hyperlipidemia - Well controlled on current regimen.  - Lipid panel - CMP14+EGFR  5. Gastroesophageal reflux disease with esophagitis, unspecified whether hemorrhage - Well controlled on current regimen.  - CMP14+EGFR  6. Neuropathy - Well controlled on current regimen.  - CMP14+EGFR  7. Seasonal allergic rhinitis due to pollen - Well controlled on current regimen.  - CMP14+EGFR  8. Vitamin D deficiency - Well controlled on current regimen.   9. Restless leg - Well controlled on current regimen.  - CMP14+EGFR  10. Chronic bilateral low back pain without sciatica - Patient declined a referral for repeat back injections at this time. Continue Tylenol, muscle rub, heating pad, back brace, and try muscle relaxer.  - CMP14+EGFR - methocarbamol (ROBAXIN) 500 MG tablet; Take 1 tablet (500 mg total) by mouth 3 (three) times daily as needed for muscle spasms.  Dispense: 30 tablet; Refill: 2  11. Post-menopausal - DG [facility] DEXA; Future  12. Need for immunization against influenza - Flu Vaccine QUAD High Dose(Fluad)   Return in about 6 weeks (around 03/04/2020) for depression.  [name], MSN, APRN, FNP-C [facility]  Subjective:    Patient ID: Allison Rollins, female    DOB: May 29, 1950, 69 y.o.   MRN: [id number]  Patient Care Team: Loman Brooklyn, FNP as PCP - General (Family Medicine) Harl Bowie Alphonse Guild, MD as PCP - Cardiology (Cardiology) Danie Binder, MD (Inactive) (Gastroenterology) Phillips Odor, MD as Consulting Physician (Neurology) Harl Bowie Alphonse Guild, MD as Consulting Physician (Cardiology)   Chief Complaint:  Chief Complaint  Patient presents with  . Diabetes    3 month follow up of chronic medical conditions    HPI: Allison Rollins is a 69 y.o. female presenting on 01/22/2020 for Diabetes (3 month follow up of chronic medical conditions)  Patient reports she has been feeling more depressed recently.   Depression screen [facility] 2/9 01/22/2020 11/27/2019 10/19/2019  Decreased Interest 0 0 0  Down, Depressed, Hopeless 1 0 0  PHQ - 2 Score 1 0 0  Altered sleeping 1 - 0  Tired, decreased energy 3 - 3  Change in appetite 0 - 0  Feeling bad or failure about yourself  0 - 0  Trouble concentrating 0 - 0  Moving slowly or fidgety/restless 0 - 0  Suicidal thoughts 0 - 0  PHQ-9 Score 5 - 3  Difficult doing work/chores - - -  Some recent data might be hidden   GAD 7 : Generalized Anxiety Score 01/22/2020 10/19/2019 11/24/2018 09/30/2016  Nervous, Anxious, on Edge 2 0 0 1  Control/stop worrying 3 0 0 1  Worry too much - different things 3 0 0 1  Trouble relaxing 1 0 0 0  Restless 3 0 0 1  Easily annoyed or irritable 1 0 0 1  Afraid - awful might happen 0 0 0 1  Total GAD 7 Score 13 0 0 6  Anxiety Difficulty - - - Not difficult at all    New complaints: Patient is  concerned about her chronic back pain. She has been wearing a back brace recently which she reports helps a little. She takes Tylenol for pain as well as applying Aspercreme and BenGay. She reports she was told she needed surgery but she did not wish to go this route. She has had injections which are helpful, but does not wish to have a referral for this at this time.   Social history:  Relevant past medical, surgical, family and social history reviewed and updated as indicated. Interim medical history since our last visit reviewed.  Allergies and medications reviewed and updated.  DATA REVIEWED: CHART IN EPIC  ROS:  Negative unless specifically indicated above in HPI.    Current Outpatient Medications:  .  acetaminophen (TYLENOL) 500 MG tablet, Take 500-1,000 mg by mouth every 6 (six) hours as needed for moderate pain (pain.). , Disp: , Rfl:  .  albuterol (PROVENTIL HFA;VENTOLIN HFA) 108 (90 Base) MCG/ACT inhaler, Inhale 2 puffs into the lungs every 6 (six) hours as needed for wheezing., Disp: 1 Inhaler, Rfl: 0 .  albuterol (PROVENTIL) (2.5 MG/3ML) 0.083% nebulizer solution, Take 3 mLs (2.5 mg total) by nebulization every 6 (six) hours as needed for wheezing or shortness of breath., Disp: 150 mL, Rfl: 1 .  Alcohol Swabs (B-D SINGLE USE SWABS REGULAR) PADS, Test sugars daily, Disp: 100 each, Rfl: 3 .  aspirin EC 81 MG tablet, Take 1 tablet (81 mg total) by mouth daily., Disp: , Rfl:  .  atorvastatin (LIPITOR) 40 MG tablet, Take 1 tablet (40 mg total) by mouth daily., Disp: 90 tablet, Rfl: 1 .  cetirizine (ZYRTEC) 10 MG tablet, Take 1 tablet (10 mg total) by mouth daily., Disp: 90 tablet, Rfl: 1 .  cholecalciferol (VITAMIN D3) 25 MCG (1000 UT) tablet, Take 1,000 Units by mouth 2 (two) times daily. , Disp: , Rfl:  .  fluticasone (FLONASE) 50 MCG/ACT nasal spray, Place 2 sprays into both nostrils daily., Disp: 16 g, Rfl: 6 .  furosemide (LASIX) 20 MG tablet, Take 1 tablet (20 mg total) by mouth daily as needed., Disp: 90 tablet, Rfl: 1 .  gabapentin (NEURONTIN) 300 MG capsule, Take 1 capsule (300 mg total) by mouth in the morning AND 2 capsules (600 mg total) at bedtime., Disp: 270 capsule, Rfl: 1 .  glucose blood (ACCU-CHEK AVIVA PLUS) test strip, Test sugars daily, Disp: 100 each, Rfl: 3 .  lisinopril (ZESTRIL) 10 MG tablet, Take 1 tablet (10 mg total) by mouth daily., Disp: 90 tablet, Rfl: 1 .  Multiple Vitamin (MULTIVITAMIN WITH MINERALS) TABS tablet, Take 1 tablet by mouth daily., Disp: , Rfl:  .  nitroGLYCERIN (NITROSTAT) 0.4 MG SL tablet, Place 1 tablet (0.4 mg total) under the tongue every 5 (five) minutes  as needed for chest pain., Disp: 20 tablet, Rfl: 3 .  nystatin cream (MYCOSTATIN), Apply 1 application topically twice daily, Disp: 30 g, Rfl: 11 .  Omega-3 Fatty Acids (FISH OIL PO), Take 1 capsule by mouth daily. , Disp: , Rfl:  .  pantoprazole (PROTONIX) 40 MG tablet, TAKE ONE TABLET BY MOUTH TWICE DAILY, Disp: 60 tablet, Rfl: 11 .  potassium chloride SA (K-DUR) 20 MEQ tablet, Take 20 mEq by mouth daily as needed (with lasix)., Disp: , Rfl:  .  pramipexole (MIRAPEX) 0.5 MG tablet, Take 1 tablet (0.5 mg total) by mouth at bedtime., Disp: 90 tablet, Rfl: 1 .  sertraline (ZOLOFT) 100 MG tablet, Take 1 tablet (100 mg total) by mouth daily., Disp: 90  tablet, Rfl: 1   Allergies  Allergen Reactions  . Penicillins Itching and Rash    Has patient had a PCN reaction causing immediate rash, facial/tongue/throat swelling, SOB or lightheadedness with hypotension: no Has patient had a PCN reaction causing severe rash involving mucus membranes or skin necrosis: no Has patient had a PCN reaction that required hospitalization: no Has patient had a PCN reaction occurring within the last 10 years: no If all of the above answers are "NO", then may proceed with Cephalosporin use.   . Sulfa Antibiotics Rash   Past Medical History:  Diagnosis Date  . Anxiety   . Asthma   . Chest pain 12/22/2015  . CHF (congestive heart failure) ([place])   . COPD (chronic obstructive pulmonary disease) ([place])   . Depression   . Diabetes mellitus without complication ([place])   . Headache   . HTN (hypertension)   . Hypoglycemia   . Hypokalemia 07/28/2013  . Kidney stones   . Parkinson's disease   . Pneumonia   . Pre-diabetes   . S/P colonoscopy 10/15/04   normal    Past Surgical History:  Procedure Laterality Date  . CHOLECYSTECTOMY    . COLONOSCOPY  10/15/2004   Normal rectum/Normal colon  . COLONOSCOPY N/A 05/10/2016   Procedure: COLONOSCOPY;  Surgeon: Danie Binder, MD;  Location: [facility];  Service: Endoscopy;   Laterality: N/A;  10:30  . ESOPHAGEAL MANOMETRY N/A 04/02/2019   Procedure: ESOPHAGEAL MANOMETRY (EM);  Surgeon: Mauri Pole, MD;  Location: [facility]OS[facility];  Service: Endoscopy;  Laterality: N/A;  . ESOPHAGOGASTRODUODENOSCOPY  09/11/2010   Dysphagia likely multifactorial (possible Candida esophagitis, likely nonspecific esophageal motility disorder, and/or uncontrolled gastroesophageal reflux disease), status post empiric dilation  . ESOPHAGOGASTRODUODENOSCOPY N/A 11/13/2018   Dr. Oneida Alar: No endoscopic esophageal abnormality to explain patient's dysphagia.  Esophagus dilated.  Moderate erosive/nodular gastritis with benign biopsies.  Marland Kitchen KIDNEY STONE SURGERY    . SAVORY DILATION N/A 11/13/2018   Procedure: SAVORY DILATION;  Surgeon: Danie Binder, MD;  Location: [facility];  Service: Endoscopy;  Laterality: N/A;    Social History   Socioeconomic History  . Marital status: Widowed    Spouse name: Not on file  . Number of children: 2  . Years of education: Not on file  . Highest education level: Not on file  Occupational History  . Occupation: disabled  Tobacco Use  . Smoking status: Former Smoker    Packs/day: 0.25    Years: 30.00    Pack years: 7.50    Types: Cigarettes    Start date: 01/04/1965    Quit date: 01/03/1995    Years since quitting: 25.0  . Smokeless tobacco: Never Used  Vaping Use  . Vaping Use: Never used  Substance and Sexual Activity  . Alcohol use: No    Alcohol/week: 0.0 standard drinks  . Drug use: No  . Sexual activity: Not on file  Other Topics Concern  . Not on file  Social History Narrative  . Not on file   Social Determinants of Health   Financial Resource Strain:   . Difficulty of Paying Living Expenses: Not on file  Food Insecurity:   . Worried About Charity fundraiser in the Last Year: Not on file  . Ran Out of Food in the Last Year: Not on file  Transportation Needs:   . Lack of Transportation (Medical): Not on file  . Lack of  Transportation (Non-Medical): Not on file  Physical Activity:   .  Days of Exercise per Week: Not on file  . Minutes of Exercise per Session: Not on file  Stress:   . Feeling of Stress : Not on file  Social Connections:   . Frequency of Communication with Friends and Family: Not on file  . Frequency of Social Gatherings with Friends and Family: Not on file  . Attends Religious Services: Not on file  . Active Member of Clubs or Organizations: Not on file  . Attends Archivist Meetings: Not on file  . Marital Status: Not on file  Intimate Partner Violence:   . Fear of Current or Ex-Partner: Not on file  . Emotionally Abused: Not on file  . Physically Abused: Not on file  . Sexually Abused: Not on file        Objective:    BP 134/76   Pulse 67   Temp 98.3 F (36.8 C) (Temporal)   Ht _0  (1.676 m)   Wt 223 lb 12.8 oz (101.5 kg)   SpO2 92%   BMI 36.12 kg/m   Wt Readings from Last 3 Encounters:  01/22/20 223 lb 12.8 oz (101.5 kg)  11/27/19 215 lb 3.2 oz (97.6 kg)  11/21/19 (!) 222 lb (100.7 kg)    Physical Exam Vitals reviewed.  Constitutional:      General: She is not in acute distress.    Appearance: Normal appearance. She is obese. She is not ill-appearing, toxic-appearing or diaphoretic.  HENT:     Head: Normocephalic and atraumatic.  Eyes:     General: No scleral icterus.       Right eye: No discharge.        Left eye: No discharge.     Conjunctiva/sclera: Conjunctivae normal.  Cardiovascular:     Rate and Rhythm: Normal rate and regular rhythm.     Heart sounds: Normal heart sounds. No murmur heard.  No friction rub. No gallop.   Pulmonary:     Effort: Pulmonary effort is normal. No respiratory distress.     Breath sounds: Normal breath sounds. No stridor. No wheezing, rhonchi or rales.  Musculoskeletal:        General: Normal range of motion.     Cervical back: Normal range of motion.  Skin:    General: Skin is warm and dry.     Capillary  Refill: Capillary refill takes less than 2 seconds.  Neurological:     General: No focal deficit present.     Mental Status: She is alert and oriented to person, place, and time. Mental status is at baseline.  Psychiatric:        Mood and Affect: Mood normal.        Behavior: Behavior normal.        Thought Content: Thought content normal.        Judgment: Judgment normal.     Lab Results  Component Value Date   TSH 2.350 11/24/2018   Lab Results  Component Value Date   WBC 8.9 07/17/2019   HGB 14.6 07/17/2019   HCT 43.3 07/17/2019   MCV 87.5 07/17/2019   PLT 245 07/17/2019   Lab Results  Component Value Date   NA 143 10/19/2019   K 4.0 10/19/2019   CO2 25 10/19/2019   GLUCOSE 111 (H) 10/19/2019   BUN 17 10/19/2019   CREATININE 1.02 (H) 10/19/2019   BILITOT 0.3 10/19/2019   ALKPHOS 79 10/19/2019   AST 20 10/19/2019   ALT 23 10/19/2019   PROT 6.0 10/19/2019  ALBUMIN 3.8 10/19/2019   CALCIUM 9.1 10/19/2019   ANIONGAP 13 07/17/2019   Lab Results  Component Value Date   CHOL 146 05/31/2019   Lab Results  Component Value Date   HDL 39 (L) 05/31/2019   Lab Results  Component Value Date   LDLCALC 83 05/31/2019   Lab Results  Component Value Date   TRIG 132 05/31/2019   Lab Results  Component Value Date   CHOLHDL 3.7 05/31/2019   Lab Results  Component Value Date   HGBA1C 5.6 10/19/2019

## 2020-01-23 LAB — CMP14+EGFR
ALT: 15 IU/L (ref 0–32)
AST: 22 IU/L (ref 0–40)
Albumin/Globulin Ratio: 1.9 (ref 1.2–2.2)
Albumin: 4.3 g/dL (ref 3.8–4.8)
Alkaline Phosphatase: 103 IU/L (ref 44–121)
BUN/Creatinine Ratio: 16 (ref 12–28)
BUN: 17 mg/dL (ref 8–27)
Bilirubin Total: 0.4 mg/dL (ref 0.0–1.2)
CO2: 26 mmol/L (ref 20–29)
Calcium: 9.4 mg/dL (ref 8.7–10.3)
Chloride: 105 mmol/L (ref 96–106)
Creatinine, Ser: 1.08 mg/dL — ABNORMAL HIGH (ref 0.57–1.00)
GFR calc Af Amer: 60 mL/min/{1.73_m2} (ref >59)
GFR calc non Af Amer: 52 mL/min/{1.73_m2} — ABNORMAL LOW (ref >59)
Globulin, Total: 2.3 g/dL (ref 1.5–4.5)
Glucose: 108 mg/dL — ABNORMAL HIGH (ref 65–99)
Potassium: 4.3 mmol/L (ref 3.5–5.2)
Sodium: 146 mmol/L — ABNORMAL HIGH (ref 134–144)
Total Protein: 6.6 g/dL (ref 6.0–8.5)

## 2020-01-23 LAB — LIPID PANEL
Chol/HDL Ratio: 3.9 ratio (ref 0.0–4.4)
Cholesterol, Total: 182 mg/dL (ref 100–199)
HDL: 47 mg/dL (ref >39)
LDL Chol Calc (NIH): 108 mg/dL — ABNORMAL HIGH (ref 0–99)
Triglycerides: 154 mg/dL — ABNORMAL HIGH (ref 0–149)
VLDL Cholesterol Cal: 27 mg/dL (ref 5–40)

## 2020-01-24 ENCOUNTER — Telehealth: Payer: Self-pay | Admitting: Family Medicine

## 2020-01-24 NOTE — Telephone Encounter (Signed)
Patient aware of bone density results 

## 2020-01-24 NOTE — Telephone Encounter (Signed)
Attempted to contact patient - Allison Rollins   States pt did not receive a call back yesterday - do not see a encounter were she called yesterday.

## 2020-01-28 ENCOUNTER — Encounter: Payer: Self-pay | Admitting: Family Medicine

## 2020-01-29 ENCOUNTER — Telehealth: Payer: Self-pay

## 2020-01-29 NOTE — Telephone Encounter (Signed)
Pt called to get her lab results. Reviewed results with pt per Britneys notes. Pt voiced understanding. Says she is taking her atorvastatin Rx daily. Wants to know if she starts taking medicine for her kidneys, will that make her have to use the bathroom all of the time? Pt also wants to know what are good things for her to drink if she can't have sodas?

## 2020-01-30 NOTE — Telephone Encounter (Signed)
Since she is still taking her atorvastatin as prescribed I recommend lifestyle modifications.  If her numbers continue to go up we will need to increase her dose.  - weight loss - exercise at least 30 minutes 5 days a week - eating a heart healthy diet which includes lots of fruits and vegetables, fibers, and healthy fats (found in fish and certain oils) while limiting/avoiding red meats, butter, cheese, fried foods, and other foods with a lot of saturated fats.  The medication for her kidneys may initially cause her to urinate more often but it is not a diuretic.  She can drink water or Gatorade instead of soda.  [employer] makes a flavored water that is really good and comes in multiple different flavors.

## 2020-01-30 NOTE — Telephone Encounter (Signed)
Patient aware and verbalizes understanding. 

## 2020-01-30 NOTE — Telephone Encounter (Signed)
lmtcb

## 2020-02-04 ENCOUNTER — Telehealth: Payer: Self-pay

## 2020-02-04 DIAGNOSIS — N1831 Chronic kidney disease, stage 3a: Secondary | ICD-10-CM

## 2020-02-04 NOTE — Telephone Encounter (Signed)
Patient states that the Lasix is causing her to be week- she was taking it daily but now she is going to take it every other day and see if that helps.  Also reviewed labs with patient and she states she is taking her atorvastatin daily.  Would like to get on something to help protect her kidneys. Aware you are out of the office today.

## 2020-02-04 NOTE — Telephone Encounter (Signed)
Lmtcb.

## 2020-02-05 MED ORDER — DAPAGLIFLOZIN PROPANEDIOL 5 MG PO TABS: 5.0000 mg | ORAL_TABLET | Freq: Every day | ORAL | 2 refills | Status: DC

## 2020-02-05 NOTE — Telephone Encounter (Signed)
Patient aware.

## 2020-02-05 NOTE — Telephone Encounter (Signed)
Farxiga sent to pharmacy

## 2020-02-08 ENCOUNTER — Ambulatory Visit: Payer: Medicare Other | Admitting: Cardiology

## 2020-02-08 DIAGNOSIS — K220 Achalasia of cardia: Secondary | ICD-10-CM | POA: Diagnosis not present

## 2020-02-08 DIAGNOSIS — R6889 Other general symptoms and signs: Secondary | ICD-10-CM | POA: Diagnosis not present

## 2020-02-08 DIAGNOSIS — Z48815 Encounter for surgical aftercare following surgery on the digestive system: Secondary | ICD-10-CM | POA: Diagnosis not present

## 2020-02-28 ENCOUNTER — Telehealth: Payer: Self-pay | Admitting: *Deleted

## 2020-02-28 ENCOUNTER — Ambulatory Visit (INDEPENDENT_AMBULATORY_CARE_PROVIDER_SITE_OTHER): Payer: Medicare Other | Admitting: *Deleted

## 2020-02-28 VITALS — BP 134/78 | Resp 20 | Ht 66.0 in | Wt 223.0 lb

## 2020-02-28 DIAGNOSIS — Z0000 Encounter for general adult medical examination without abnormal findings: Secondary | ICD-10-CM | POA: Diagnosis not present

## 2020-02-28 NOTE — Progress Notes (Signed)
MEDICARE ANNUAL WELLNESS VISIT  02/28/2020  Telephone Visit Disclaimer This Medicare AWV was conducted by telephone due to national recommendations for restrictions regarding the COVID-19 Pandemic (e.g. social distancing).  I verified, using two identifiers, that I am speaking with Allison Rollins or their authorized healthcare agent. I discussed the limitations, risks, security, and privacy concerns of performing an evaluation and management service by telephone and the potential availability of an in-person appointment in the future. The patient expressed understanding and agreed to proceed.  Location of Patient: in her home Location of Provider (nurse):  [facility]   Subjective:    Allison Rollins is a 69 y.o. female patient of Gwenlyn Fudge, FNP who had a Medicare Annual Wellness Visit today via telephone. Allison Rollins is Retired and lives alone. she has 2 children. she reports that she is socially active and does interact with friends/family regularly. she is not physically active and enjoys reading.  Patient Care Team: Gwenlyn Fudge, FNP as PCP - General (Family Medicine) Wyline Mood Dorothe Pea, MD as PCP - Cardiology (Cardiology) West Bali, MD (Inactive) (Gastroenterology) Beryle Beams, MD as Consulting Physician (Neurology) Antoine Poche, MD as Consulting Physician (Cardiology)  Advanced Directives 02/28/2020 11/21/2019 07/17/2019 02/22/2019 11/13/2018 02/20/2018 12/25/2017  Does Patient Have a Medical Advance Directive? No No No Yes No No No  Type of Advance Directive - - - Living will;Healthcare Power of Attorney - - -  Does patient want to make changes to medical advance directive? - - - No - Patient declined - - -  Copy of Healthcare Power of Attorney in Chart? - - - No - copy requested - - -  Would patient like information on creating a medical advance directive? No - Patient declined No - Patient declined No - Patient declined - - Yes (MAU/Ambulatory/Procedural Areas -  Information given) -  Pre-existing out of facility DNR order (yellow form or pink MOST form) - - - - - - -    Hospital Utilization Over the Past 12 Months: # of hospitalizations or ER visits: 3 # of surgeries: 0  Review of Systems    Patient reports that her overall health is worse compared to last year.  General ROS: negative  Patient Reported Readings (BP, Pulse, CBG, Weight, etc) BP 134/78   Resp 20   Ht  (1.676 m)   Wt 223 lb (101.2 kg)   BMI 35.99 kg/m    Pain Assessment       Current Medications & Allergies (verified) Allergies as of 02/28/2020      Reactions   Penicillins Itching, Rash   Has patient had a PCN reaction causing immediate rash, facial/tongue/throat swelling, SOB or lightheadedness with hypotension: no Has patient had a PCN reaction causing severe rash involving mucus membranes or skin necrosis: no Has patient had a PCN reaction that required hospitalization: no Has patient had a PCN reaction occurring within the last 10 years: no If all of the above answers are "NO", then may proceed with Cephalosporin use.   Sulfa Antibiotics Rash      Medication List       Accurate as of February 28, 2020  2:32 PM. If you have any questions, ask your nurse or doctor.        STOP taking these medications   acetaminophen 500 MG tablet Commonly known as: TYLENOL     TAKE these medications   albuterol (2.5 MG/3ML) 0.083% nebulizer solution Commonly known as: PROVENTIL Take  3 mLs (2.5 mg total) by nebulization every 6 (six) hours as needed for wheezing or shortness of breath.   albuterol 108 (90 Base) MCG/ACT inhaler Commonly known as: VENTOLIN HFA Inhale 2 puffs into the lungs every 6 (six) hours as needed for wheezing.   aspirin EC 81 MG tablet Take 1 tablet (81 mg total) by mouth daily.   atorvastatin 40 MG tablet Commonly known as: LIPITOR Take 1 tablet (40 mg total) by mouth daily.   B-D SINGLE USE SWABS REGULAR Pads Test sugars daily    cetirizine 10 MG tablet Commonly known as: ZYRTEC Take 1 tablet (10 mg total) by mouth daily.   cholecalciferol 25 MCG (1000 UNIT) tablet Commonly known as: VITAMIN D3 Take 1,000 Units by mouth 2 (two) times daily.   dapagliflozin propanediol 5 MG Tabs tablet Commonly known as: Farxiga Take 1 tablet (5 mg total) by mouth daily before breakfast.   FISH OIL PO Take 1 capsule by mouth daily.   fluticasone 50 MCG/ACT nasal spray Commonly known as: FLONASE Place 2 sprays into both nostrils daily.   furosemide 20 MG tablet Commonly known as: LASIX Take 1 tablet (20 mg total) by mouth daily as needed.   gabapentin 300 MG capsule Commonly known as: NEURONTIN Take 1 capsule (300 mg total) by mouth in the morning AND 2 capsules (600 mg total) at bedtime.   glucose blood test strip Commonly known as: Accu-Chek Aviva Plus Test sugars daily   lisinopril 10 MG tablet Commonly known as: ZESTRIL Take 1 tablet (10 mg total) by mouth daily.   methocarbamol 500 MG tablet Commonly known as: Robaxin Take 1 tablet (500 mg total) by mouth 3 (three) times daily as needed for muscle spasms.   multivitamin with minerals Tabs tablet Take 1 tablet by mouth daily.   nitroGLYCERIN 0.4 MG SL tablet Commonly known as: NITROSTAT Place 1 tablet (0.4 mg total) under the tongue every 5 (five) minutes as needed for chest pain.   nystatin cream Commonly known as: MYCOSTATIN Apply 1 application topically twice daily   pantoprazole 40 MG tablet Commonly known as: PROTONIX TAKE ONE TABLET BY MOUTH TWICE DAILY   potassium chloride SA 20 MEQ tablet Commonly known as: KLOR-CON Take 20 mEq by mouth daily as needed (with lasix).   pramipexole 0.5 MG tablet Commonly known as: MIRAPEX Take 1 tablet (0.5 mg total) by mouth at bedtime.   sertraline 100 MG tablet Commonly known as: ZOLOFT Take 1.5 tablets (150 mg total) by mouth daily.       History (reviewed): Past Medical History:  Diagnosis  Date  . Anxiety   . Asthma   . Chest pain 12/22/2015  . CHF (congestive heart failure) (HCC)   . COPD (chronic obstructive pulmonary disease) (HCC)   . Depression   . Headache   . HTN (hypertension)   . Hypokalemia 07/28/2013  . Kidney stones   . Osteopenia 01/22/2020  . Parkinson's disease   . Pneumonia   . Pre-diabetes   . S/P colonoscopy 10/15/04   normal   Past Surgical History:  Procedure Laterality Date  . CHOLECYSTECTOMY    . COLONOSCOPY  10/15/2004   Normal rectum/Normal colon  . COLONOSCOPY N/A 05/10/2016   Procedure: COLONOSCOPY;  Surgeon: West Bali, MD;  [facility];  Service: Endoscopy;  Laterality: N/A;  10:30  . ESOPHAGEAL MANOMETRY N/A 04/02/2019   Procedure: ESOPHAGEAL MANOMETRY (EM);  Surgeon: Napoleon Form, MD;  Location: [facility];  Service: Endoscopy;  Laterality: N/A;  . ESOPHAGOGASTRODUODENOSCOPY  09/11/2010   Dysphagia likely multifactorial (possible Candida esophagitis, likely nonspecific esophageal motility disorder, and/or uncontrolled gastroesophageal reflux disease), status post empiric dilation  . ESOPHAGOGASTRODUODENOSCOPY N/A 11/13/2018   Dr. Darrick Penna: No endoscopic esophageal abnormality to explain patient's dysphagia.  Esophagus dilated.  Moderate erosive/nodular gastritis with benign biopsies.  Marland Kitchen KIDNEY STONE SURGERY    . SAVORY DILATION N/A 11/13/2018   Procedure: SAVORY DILATION;  Surgeon: West Bali, MD;  Location: [facility];  Service: Endoscopy;  Laterality: N/A;   Family History  Problem Relation Age of Onset  . Coronary artery disease Father   . COPD Mother   . Asthma Brother   . Coronary artery disease Sister   . Diabetes Sister   . Coronary artery disease Brother   . Coronary artery disease Sister    Social History   Socioeconomic History  . Marital status: Widowed    Spouse name: Not on file  . Number of children: 2  . Years of education: Not on file  . Highest education level: Not on file   Occupational History  . Occupation: disabled  Tobacco Use  . Smoking status: Former Smoker    Packs/day: 0.25    Years: 30.00    Pack years: 7.50    Types: Cigarettes    Start date: 01/04/1965    Quit date: 01/03/1995    Years since quitting: 25.1  . Smokeless tobacco: Never Used  Vaping Use  . Vaping Use: Never used  Substance and Sexual Activity  . Alcohol use: No    Alcohol/week: 0.0 standard drinks  . Drug use: No  . Sexual activity: Not on file  Other Topics Concern  . Not on file  Social History Narrative  . Not on file   Social Determinants of Health   Financial Resource Strain:   . Difficulty of Paying Living Expenses: Not on file  Food Insecurity:   . Worried About Programme researcher, broadcasting/film/video in the Last Year: Not on file  . Ran Out of Food in the Last Year: Not on file  Transportation Needs:   . Lack of Transportation (Medical): Not on file  . Lack of Transportation (Non-Medical): Not on file  Physical Activity:   . Days of Exercise per Week: Not on file  . Minutes of Exercise per Session: Not on file  Stress:   . Feeling of Stress : Not on file  Social Connections:   . Frequency of Communication with Friends and Family: Not on file  . Frequency of Social Gatherings with Friends and Family: Not on file  . Attends Religious Services: Not on file  . Active Member of Clubs or Organizations: Not on file  . Attends Banker Meetings: Not on file  . Marital Status: Not on file    Activities of Daily Living In your present state of health, do you have any difficulty performing the following activities: 02/28/2020  Hearing? N  Vision? N  Difficulty concentrating or making decisions? N  Walking or climbing stairs? Y  Dressing or bathing? N  Doing errands, shopping? N  Preparing Food and eating ? N  Using the Toilet? N  In the past six months, have you accidently leaked urine? Y  Do you have problems with loss of bowel control? N  Managing your  Medications? N  Managing your Finances? N  Housekeeping or managing your Housekeeping? N  Some recent data might be hidden    Patient Education/  Literacy    Exercise Current Exercise Habits: The patient does not participate in regular exercise at present, Exercise limited by: None identified  Diet Patient reports consuming 2 meals a day and 1 snack(s) a day Patient reports that her primary diet is: Regular Patient reports that she does have regular access to food.   Depression Screen PHQ 2/9 Scores 02/28/2020 01/22/2020 11/27/2019 10/19/2019 05/31/2019 02/26/2019 02/22/2019  PHQ - 2 Score 1 1 0 0 0 0 0  PHQ- 9 Score - 5 - 3 - - -  Exception Documentation - - - - - - -  Not completed - - - - - - -     Fall Risk Fall Risk  02/28/2020 01/22/2020 11/27/2019 10/19/2019 05/31/2019  Falls in the past year? 1 1 1  0 0  Comment - - - - -  Number falls in past yr: 1 0 0 - -  Comment - - - - -  Injury with Fall? 1 0 1 - -  Risk for fall due to : History of fall(s) - - - -  Follow up Falls evaluation completed Falls prevention discussed Education provided - -     Objective:  seemed alert and oriented and she participated appropriately during our telephone visit.  Blood Pressure Weight BMI  BP Readings from Last 3 Encounters:  02/28/20 134/78  01/22/20 134/76  11/27/19 140/74   Wt Readings from Last 3 Encounters:  02/28/20 223 lb (101.2 kg)  01/22/20 223 lb 12.8 oz (101.5 kg)  11/27/19 215 lb 3.2 oz (97.6 kg)   BMI Readings from Last 1 Encounters:  02/28/20 35.99 kg/m    *Unable to obtain current vital signs, weight, and BMI due to telephone visit type  Hearing/Vision  . 13/04/21 did not seem to have difficulty with hearing/understanding during the telephone conversation . Reports that she has not had a formal eye exam by an eye care professional within the past year . Reports that she has not had a formal hearing evaluation within the past year *Unable to fully assess hearing  and vision during telephone visit type  Cognitive Function: 6CIT Screen 02/28/2020 02/22/2019  What Year? 0 points 4 points  What month? 0 points 0 points  What time? 0 points 0 points  Count back from 20 2 points 0 points  Months in reverse 0 points 2 points  Repeat phrase 0 points 0 points  Total Score 2 6   (Normal:0-7, Significant for Dysfunction: >8)  Normal Cognitive Function Screening: Yes   Immunization & Health Maintenance Record Immunization History  Administered Date(s) Administered  . Fluad Quad(high Dose 65+) 01/22/2020  . Influenza, High Dose Seasonal PF 02/02/2015, 02/09/2016, 01/13/2017, 01/07/2018, 12/18/2018  . Influenza-Unspecified 12/18/2018  . PFIZER SARS-COV-2 Vaccination 09/29/2019, 10/29/2019  . Pneumococcal Conjugate-13 01/05/2015  . Pneumococcal Polysaccharide-23 02/09/2016  . Zoster 01/05/2015    Health Maintenance  Topic Date Due  . MAMMOGRAM  10/18/2020 (Originally 12/16/2017)  . TETANUS/TDAP  01/21/2021 (Originally 01/04/1969)  . DEXA SCAN  01/21/2022  . COLONOSCOPY  05/10/2026  . INFLUENZA VACCINE  Completed  . COVID-19 Vaccine  Completed  . Hepatitis C Screening  Completed  . PNA vac Low Risk Adult  Completed       Assessment  This is a routine wellness examination for 05/12/2026.  Health Maintenance: Due or Overdue There are no preventive care reminders to display for this patient.  [employer] does not need a referral for Community Assistance: Care  Management:   no Social Work:    no Prescription Assistance:  no Nutrition/Diabetes Education:  no   Plan:  Personalized Goals Goals Addressed   None    Personalized Health Maintenance & Screening Recommendations    Lung Cancer Screening Recommended: no (Low Dose CT Chest recommended if Age 86-80 years, 30 pack-year currently smoking OR have quit w/in past 15 years) Hepatitis C Screening recommended: no HIV Screening recommended: no  Advanced Directives: Written  information was not prepared per patient's request.  Referrals & Orders No orders of the defined types were placed in this encounter.   Follow-up Plan . Follow-up with Gwenlyn FudgeJoyce, Britney F, FNP as planned    I have personally reviewed and noted the following in the patient's chart:   . Medical and social history . Use of alcohol, tobacco or illicit drugs  . Current medications and supplements . Functional ability and status . Nutritional status . Physical activity . Advanced directives . List of other physicians . Hospitalizations, surgeries, and ER visits in previous 12 months . Vitals . Screenings to include cognitive, depression, and falls . Referrals and appointments  In addition, I have reviewed and discussed with Allison HippoGail R Salsgiver certain preventive protocols, quality metrics, and best practice recommendations. A written personalized care plan for preventive services as well as general preventive health recommendations is available and can be mailed to the patient at her request.      Leilani MerlBullins, Tyniesha Howald Hundley  02/28/2020

## 2020-02-28 NOTE — Telephone Encounter (Signed)
Error

## 2020-02-29 ENCOUNTER — Ambulatory Visit (INDEPENDENT_AMBULATORY_CARE_PROVIDER_SITE_OTHER): Payer: Medicare Other | Admitting: Pharmacist

## 2020-02-29 ENCOUNTER — Telehealth: Payer: Self-pay

## 2020-02-29 ENCOUNTER — Telehealth: Payer: Self-pay | Admitting: Family Medicine

## 2020-02-29 DIAGNOSIS — N393 Stress incontinence (female) (male): Secondary | ICD-10-CM

## 2020-02-29 DIAGNOSIS — E119 Type 2 diabetes mellitus without complications: Secondary | ICD-10-CM | POA: Diagnosis not present

## 2020-02-29 MED ORDER — DAPAGLIFLOZIN PROPANEDIOL 10 MG PO TABS
10.0000 mg | ORAL_TABLET | Freq: Every day | ORAL | 3 refills | Status: DC
Start: 2020-02-29 — End: 2020-03-25

## 2020-02-29 MED ORDER — ACCU-CHEK AVIVA PLUS VI STRP: ORAL_STRIP | 3 refills | Status: AC

## 2020-02-29 NOTE — Telephone Encounter (Signed)
Pt is requesting to have a prescription sent over for adult pull ups, was told that if a dr prescribed them for her that she could get them from [employer] instead of paying for them at the store

## 2020-02-29 NOTE — Progress Notes (Signed)
    02/29/2020 Name: Allison Rollins MRN: [id number] DOB: Dec 30, 1950   S:  58 yoF Presents for diabetes evaluation, education, and management Patient was referred and last seen by Primary Care Provider on 01/22/20. Insurance coverage/medication affordability: [employer] medicare  Patient reports adherence with medications. . Current diabetes medications include: farxiga . Current hypertension medications include: lisinopril Goal 130/80 . Current hyperlipidemia medications include: atorvastatin   Patient denies hypoglycemic events.   Patient reported dietary habits: Eats 2-3 meals/day Discussed meal planning options and Plate method for healthy eating . Avoid sugary drinks and desserts . Incorporate balanced protein, non starchy veggies, 1 serving of carbohydrate with each meal . Increase water intake . Increase physical activity as able  Avoids dark drinks; drinks sprite, sierra mist--encouraged patient to limit to 1 soda daily  Patient-reported exercise habits: walks around the house, neighbors  Patient reports nocturia (nighttime urination). Wears depends  O:  Lab Results  Component Value Date   HGBA1C 5.6 01/22/2020    Lipid Panel     Component Value Date/Time   CHOL 182 01/22/2020 1056   TRIG 154 (H) 01/22/2020 1056   HDL 47 01/22/2020 1056   CHOLHDL 3.9 01/22/2020 1056   CHOLHDL 6.9 09/19/2014 0830   VLDL 49 (H) 09/19/2014 0830   LDLCALC 108 (H) 01/22/2020 1056    Home fasting blood sugars: 130  2 hour post-meal/random blood sugars: 170s.  A/P:  Diabetes T2DM currently controlled.  Discontinued metformin.  Starting farxiga  -Increased dose of SGLT2-I FARXIGA (generic name DAPAGLIFLOZIN) to 10MG  DAILY. Using for CKD protection  APPLICATION SUBMITTED FOR AZ&ME PATIENT ASSISTANCE  30 DAY COUPON FOR PATIENT TO PICK UP AND TAKE TO [facility] FOR 1 TIME FILL  -Extensively discussed pathophysiology of diabetes, recommended lifestyle interventions, dietary effects on blood  sugar control  -Counseled on s/sx of and management of hypoglycemia  -Next A1C anticipated 6 months.  Written patient instructions provided.  Total time in face to face counseling 30 minutes.   Follow up PCP Clinic Visit ON 03/06/20.    13/11/21, PharmD, BCPS Clinical Pharmacist, [facility]  II Phone [phone]

## 2020-02-29 NOTE — Telephone Encounter (Signed)
Patient aware it has been routed to provider and sent to [facility]

## 2020-03-03 ENCOUNTER — Other Ambulatory Visit: Payer: Self-pay | Admitting: Family Medicine

## 2020-03-03 DIAGNOSIS — G2581 Restless legs syndrome: Secondary | ICD-10-CM

## 2020-03-04 ENCOUNTER — Ambulatory Visit: Payer: Medicare Other | Admitting: Nurse Practitioner

## 2020-03-04 ENCOUNTER — Telehealth: Payer: Self-pay

## 2020-03-04 NOTE — Telephone Encounter (Signed)
Okay I must missed it, added this diagnosis and printed the prescription.

## 2020-03-04 NOTE — Telephone Encounter (Signed)
Pt states that her pull ups were denied by  Her insurance

## 2020-03-04 NOTE — Telephone Encounter (Signed)
Patient aware.  Will fax order to [place] once signed

## 2020-03-04 NOTE — Telephone Encounter (Signed)
I have tried to do a chart review and I cannot find any diagnosis of incontinence or recurrent diarrhea or any diagnosis to put that we will cover the polyps on her chart, I do not know what her medical diagnosis her issue is but we would need that before we could prescribe any polyps that would be covered.

## 2020-03-04 NOTE — Telephone Encounter (Signed)
We have just faxed over an order to [facility]. If pt still gets a denial she will have to purchase OTC.

## 2020-03-05 ENCOUNTER — Telehealth: Payer: Self-pay | Admitting: Family Medicine

## 2020-03-05 NOTE — Telephone Encounter (Signed)
Pt was returning a call that she received yesterday

## 2020-03-06 ENCOUNTER — Other Ambulatory Visit: Payer: Self-pay

## 2020-03-06 ENCOUNTER — Ambulatory Visit: Payer: Medicare Other | Admitting: Nurse Practitioner

## 2020-03-06 NOTE — Telephone Encounter (Signed)
Met with patient in clinic today

## 2020-03-10 ENCOUNTER — Ambulatory Visit: Payer: Medicare Other | Admitting: Cardiology

## 2020-03-10 ENCOUNTER — Ambulatory Visit (INDEPENDENT_AMBULATORY_CARE_PROVIDER_SITE_OTHER): Payer: Medicare Other | Admitting: Cardiology

## 2020-03-10 ENCOUNTER — Encounter: Payer: Self-pay | Admitting: Cardiology

## 2020-03-10 VITALS — BP 148/100 | HR 59 | Ht 66.0 in | Wt 224.0 lb

## 2020-03-10 DIAGNOSIS — I5032 Chronic diastolic (congestive) heart failure: Secondary | ICD-10-CM | POA: Diagnosis not present

## 2020-03-10 DIAGNOSIS — I10 Essential (primary) hypertension: Secondary | ICD-10-CM | POA: Diagnosis not present

## 2020-03-10 DIAGNOSIS — R001 Bradycardia, unspecified: Secondary | ICD-10-CM

## 2020-03-10 NOTE — Patient Instructions (Signed)

## 2020-03-10 NOTE — Progress Notes (Signed)
Clinical Summary Allison Rollins is a 69 y.o.female seen today for follow up of the following medical problems.   1. Chronic diastolic heart failure  - no recent edema per her report   2. HTN -she is compliant with meds  3. COPD - compliant with inhalers  4. Bradycardia - no recent symptoms  5. Hyperlipidemia  10/2018 TC 219 TG 196 HDL 45 LDL 135 - 12/2019 TC 182 TG 154 HDL 47 LD 108 - compliant with statin  6. OSA screen - she reports test was negative.     SH: has 3 grandkids, 2 sons. Husband just passed this March. Works at [employer]. One grandson will be starting at [employer] in 2 years. Granddaughter  Another grandson due Oct/Nov.       Past Medical History:  Diagnosis Date  . Anxiety   . Asthma   . Chest pain 12/22/2015  . CHF (congestive heart failure) (HCC)   . COPD (chronic obstructive pulmonary disease) (HCC)   . Depression   . Headache   . HTN (hypertension)   . Hypokalemia 07/28/2013  . Kidney stones   . Osteopenia 01/22/2020  . Parkinson's disease   . Pneumonia   . Pre-diabetes   . S/P colonoscopy 10/15/04   normal     Allergies  Allergen Reactions  . Penicillins Itching and Rash    Has patient had a PCN reaction causing immediate rash, facial/tongue/throat swelling, SOB or lightheadedness with hypotension: no Has patient had a PCN reaction causing severe rash involving mucus membranes or skin necrosis: no Has patient had a PCN reaction that required hospitalization: no Has patient had a PCN reaction occurring within the last 10 years: no If all of the above answers are "NO", then may proceed with Cephalosporin use.   . Sulfa Antibiotics Rash     Current Outpatient Medications  Medication Sig Dispense Refill  . albuterol (PROVENTIL HFA;VENTOLIN HFA) 108 (90 Base) MCG/ACT inhaler Inhale 2 puffs into the lungs every 6 (six) hours as needed for wheezing. 1 Inhaler 0  . albuterol (PROVENTIL) (2.5 MG/3ML)  0.083% nebulizer solution Take 3 mLs (2.5 mg total) by nebulization every 6 (six) hours as needed for wheezing or shortness of breath. 150 mL 1  . Alcohol Swabs (B-D SINGLE USE SWABS REGULAR) PADS Test sugars daily 100 each 3  . aspirin EC 81 MG tablet Take 1 tablet (81 mg total) by mouth daily.    Marland Kitchen atorvastatin (LIPITOR) 40 MG tablet Take 1 tablet (40 mg total) by mouth daily. 90 tablet 1  . cetirizine (ZYRTEC) 10 MG tablet Take 1 tablet (10 mg total) by mouth daily. 90 tablet 1  . cholecalciferol (VITAMIN D3) 25 MCG (1000 UT) tablet Take 1,000 Units by mouth 2 (two) times daily.     . dapagliflozin propanediol (FARXIGA) 10 MG TABS tablet Take 1 tablet (10 mg total) by mouth daily before breakfast. 30 tablet 3  . fluticasone (FLONASE) 50 MCG/ACT nasal spray Place 2 sprays into both nostrils daily. 16 g 6  . furosemide (LASIX) 20 MG tablet Take 1 tablet (20 mg total) by mouth daily as needed. 90 tablet 1  . gabapentin (NEURONTIN) 300 MG capsule Take 1 capsule (300 mg total) by mouth in the morning AND 2 capsules (600 mg total) at bedtime. 270 capsule 1  . glucose blood (ACCU-CHEK AVIVA PLUS) test strip Test sugars daily 100 each 3  . lisinopril (ZESTRIL) 10 MG tablet Take 1 tablet (10 mg total)  by mouth daily. 90 tablet 1  . methocarbamol (ROBAXIN) 500 MG tablet Take 1 tablet (500 mg total) by mouth 3 (three) times daily as needed for muscle spasms. (Patient not taking: Reported on 02/28/2020) 30 tablet 2  . Multiple Vitamin (MULTIVITAMIN WITH MINERALS) TABS tablet Take 1 tablet by mouth daily.    . nitroGLYCERIN (NITROSTAT) 0.4 MG SL tablet Place 1 tablet (0.4 mg total) under the tongue every 5 (five) minutes as needed for chest pain. 20 tablet 3  . nystatin cream (MYCOSTATIN) Apply 1 application topically twice daily 30 g 11  . Omega-3 Fatty Acids (FISH OIL PO) Take 1 capsule by mouth daily.  (Patient not taking: Reported on 02/28/2020)    . pantoprazole (PROTONIX) 40 MG tablet TAKE ONE TABLET BY  MOUTH TWICE DAILY 60 tablet 11  . potassium chloride SA (K-DUR) 20 MEQ tablet Take 20 mEq by mouth daily as needed (with lasix).    . pramipexole (MIRAPEX) 0.5 MG tablet Take 1 tablet by mouth at bedtime 30 tablet 0  . sertraline (ZOLOFT) 100 MG tablet Take 1.5 tablets (150 mg total) by mouth daily. 135 tablet 1   No current facility-administered medications for this visit.     Past Surgical History:  Procedure Laterality Date  . CHOLECYSTECTOMY    . COLONOSCOPY  10/15/2004   Normal rectum/Normal colon  . COLONOSCOPY N/A 05/10/2016   Procedure: COLONOSCOPY;  Surgeon: West Bali, MD;  Location: [facility];  Service: Endoscopy;  Laterality: N/A;  10:30  . ESOPHAGEAL MANOMETRY N/A 04/02/2019   Procedure: ESOPHAGEAL MANOMETRY (EM);  Surgeon: Napoleon Form, MD;  Location: [facility];  Service: Endoscopy;  Laterality: N/A;  . ESOPHAGOGASTRODUODENOSCOPY  09/11/2010   Dysphagia likely multifactorial (possible Candida esophagitis, likely nonspecific esophageal motility disorder, and/or uncontrolled gastroesophageal reflux disease), status post empiric dilation  . ESOPHAGOGASTRODUODENOSCOPY N/A 11/13/2018   Dr. Darrick Penna: No endoscopic esophageal abnormality to explain patient's dysphagia.  Esophagus dilated.  Moderate erosive/nodular gastritis with benign biopsies.  Marland Kitchen KIDNEY STONE SURGERY    . SAVORY DILATION N/A 11/13/2018   Procedure: SAVORY DILATION;  Surgeon: West Bali, MD;  Location: [facility];  Service: Endoscopy;  Laterality: N/A;     Allergies  Allergen Reactions  . Penicillins Itching and Rash    Has patient had a PCN reaction causing immediate rash, facial/tongue/throat swelling, SOB or lightheadedness with hypotension: no Has patient had a PCN reaction causing severe rash involving mucus membranes or skin necrosis: no Has patient had a PCN reaction that required hospitalization: no Has patient had a PCN reaction occurring within the last 10 years: no If all of  the above answers are "NO", then may proceed with Cephalosporin use.   . Sulfa Antibiotics Rash      Family History  Problem Relation Age of Onset  . Coronary artery disease Father   . COPD Mother   . Asthma Brother   . Coronary artery disease Sister   . Diabetes Sister   . Coronary artery disease Brother   . Coronary artery disease Sister      Social History Allison Rollins reports that she quit smoking about 25 years ago. Her smoking use included cigarettes. She started smoking about 55 years ago. She has a 7.50 pack-year smoking history. She has never used smokeless tobacco. Allison Rollins reports no history of alcohol use.   Review of Systems CONSTITUTIONAL: No weight loss, fever, chills, weakness or fatigue.  HEENT: Eyes: No visual loss, blurred vision, double  vision or yellow sclerae.No hearing loss, sneezing, congestion, runny nose or sore throat.  SKIN: No rash or itching.  CARDIOVASCULAR: per hpi RESPIRATORY: No shortness of breath, cough or sputum.  GASTROINTESTINAL: No anorexia, nausea, vomiting or diarrhea. No abdominal pain or blood.  GENITOURINARY: No burning on urination, no polyuria NEUROLOGICAL: No headache, dizziness, syncope, paralysis, ataxia, numbness or tingling in the extremities. No change in bowel or bladder control.  MUSCULOSKELETAL: No muscle, back pain, joint pain or stiffness.  LYMPHATICS: No enlarged nodes. No history of splenectomy.  PSYCHIATRIC: No history of depression or anxiety.  ENDOCRINOLOGIC: No reports of sweating, cold or heat intolerance. No polyuria or polydipsia.  Marland Kitchen   Physical Examination Today's Vitals   03/10/20 1509  BP: (!) 148/100  Pulse: (!) 59  SpO2: 93%  Weight: 224 lb (101.6 kg)  Height: 5\' 6"  (1.676 m)   Body mass index is 36.15 kg/m.  Gen: resting comfortably, no acute distress HEENT: no scleral icterus, pupils equal round and reactive, no palptable cervical adenopathy,  CV: RRR, no m/r/g, no jvd Resp: Clear to  auscultation bilaterally GI: abdomen is soft, non-tender, non-distended, normal bowel sounds, no hepatosplenomegaly MSK: extremities are warm, trace bilateral edema Skin: warm, no rash Neuro:  no focal deficits Psych: appropriate affect   Diagnostic Studies  11/2013 Echo Study Conclusions  - Procedure narrative: Transthoracic echocardiography. Image quality was suboptimal. The study was technically difficult, as a result of poor sound wave transmission and body habitus. - Left ventricle: The cavity size was normal. Wall thickness was increased in a pattern of mild LVH. Systolic function was low normal. The estimated ejection fraction was approximately 50%. Images were inadequate for LV wall motion assessment, but no gross regional variation was noted. Features are consistent with a pseudonormal left ventricular filling pattern, with concomitant abnormal relaxation and increased filling pressure (grade 2 diastolic dysfunction). Doppler parameters are consistent with both elevated ventricular end-diastolic filling pressure and elevated left atrial filling pressure. - Mitral valve: Mildly calcified annulus. Mildly thickened leaflets . There was mild regurgitation. - Left atrium: The atrium was moderately to severely dilated.  11/2015 Nuclear stress  There was no ST segment deviation noted during stress.  No T wave inversion was noted during stress.  Findings consistent with prior myocardial infarction.  This is a low risk study.  The left ventricular ejection fraction is mildly decreased (45-54%).  Small inferolateral wall infarct at mid and basal level EF estimated 50% but looks normal no ischemia   Assessment and Plan  1.Chronic diastolic heart failure - overall controlled, continue diuretic.   2. HTN -manual recheck 134/80, essentially at goal. Continue current meds  3. Bradycardia - chronic sinus bradycardia that is asymptomatic - continue to monitor.    4. Hyperlipidemia -at goal, continue statin  F/u 6 months    12/2015, M.D

## 2020-03-12 ENCOUNTER — Telehealth: Payer: Self-pay

## 2020-03-12 NOTE — Telephone Encounter (Signed)
Advised decaffeinated drinks only

## 2020-03-14 ENCOUNTER — Ambulatory Visit (INDEPENDENT_AMBULATORY_CARE_PROVIDER_SITE_OTHER): Payer: Medicare Other | Admitting: Nurse Practitioner

## 2020-03-14 ENCOUNTER — Other Ambulatory Visit: Payer: Self-pay

## 2020-03-14 ENCOUNTER — Encounter: Payer: Self-pay | Admitting: Nurse Practitioner

## 2020-03-14 VITALS — BP 140/87 | HR 62 | Temp 96.3°F | Ht 66.0 in | Wt 223.0 lb

## 2020-03-14 DIAGNOSIS — E1169 Type 2 diabetes mellitus with other specified complication: Secondary | ICD-10-CM | POA: Diagnosis not present

## 2020-03-14 DIAGNOSIS — G629 Polyneuropathy, unspecified: Secondary | ICD-10-CM

## 2020-03-14 DIAGNOSIS — I10 Essential (primary) hypertension: Secondary | ICD-10-CM | POA: Diagnosis not present

## 2020-03-14 DIAGNOSIS — F339 Major depressive disorder, recurrent, unspecified: Secondary | ICD-10-CM

## 2020-03-14 DIAGNOSIS — I5032 Chronic diastolic (congestive) heart failure: Secondary | ICD-10-CM

## 2020-03-14 DIAGNOSIS — E785 Hyperlipidemia, unspecified: Secondary | ICD-10-CM

## 2020-03-14 DIAGNOSIS — J301 Allergic rhinitis due to pollen: Secondary | ICD-10-CM

## 2020-03-14 MED ORDER — ATORVASTATIN CALCIUM 40 MG PO TABS: 40.0000 mg | ORAL_TABLET | Freq: Every day | ORAL | 1 refills | Status: DC

## 2020-03-14 MED ORDER — GABAPENTIN 300 MG PO CAPS: ORAL_CAPSULE | ORAL | 1 refills | Status: DC

## 2020-03-14 MED ORDER — LISINOPRIL 10 MG PO TABS: 10.0000 mg | ORAL_TABLET | Freq: Every day | ORAL | 1 refills | Status: DC

## 2020-03-14 MED ORDER — CETIRIZINE HCL 10 MG PO TABS: 10.0000 mg | ORAL_TABLET | Freq: Every day | ORAL | 1 refills | Status: DC

## 2020-03-14 MED ORDER — FUROSEMIDE 20 MG PO TABS: 20.0000 mg | ORAL_TABLET | Freq: Every day | ORAL | 1 refills | Status: DC | PRN

## 2020-03-14 NOTE — Patient Instructions (Addendum)
Patient is following up 6 weeks depression.  Patient is well controlled on current medication no changes necessary.  Patient will follow up in 3 months.   Living With Depression Everyone experiences occasional disappointment, sadness, and loss in their lives. When you are feeling down, blue, or sad for at least 2 weeks in a row, it may mean that you have depression. Depression can affect your thoughts and feelings, relationships, daily activities, and physical health. It is caused by changes in the way your brain functions. If you receive a diagnosis of depression, your health care provider will tell you which type of depression you have and what treatment options are available to you. If you are living with depression, there are ways to help you recover from it and also ways to prevent it from coming back. How to cope with lifestyle changes Coping with stress     Stress is your body's reaction to life changes and events, both good and bad. Stressful situations may include:  Getting married.  The death of a spouse.  Losing a job.  Retiring.  Having a baby. Stress can last just a few hours or it can be ongoing. Stress can play a major role in depression, so it is important to learn both how to cope with stress and how to think about it differently. Talk with your health care provider or a counselor if you would like to learn more about stress reduction. He or she may suggest some stress reduction techniques, such as:  Music therapy. This can include creating music or listening to music. Choose music that you enjoy and that inspires you.  Mindfulness-based meditation. This kind of meditation can be done while sitting or walking. It involves being aware of your normal breaths, rather than trying to control your breathing.  Centering prayer. This is a kind of meditation that involves focusing on a spiritual word or phrase. Choose a word, phrase, or sacred image that is meaningful to you and  that brings you peace.  Deep breathing. To do this, expand your stomach and inhale slowly through your nose. Hold your breath for 3-5 seconds, then exhale slowly, allowing your stomach muscles to relax.  Muscle relaxation. This involves intentionally tensing muscles then relaxing them. Choose a stress reduction technique that fits your lifestyle and personality. Stress reduction techniques take time and practice to develop. Set aside 5-15 minutes a day to do them. Therapists can offer training in these techniques. The training may be covered by some insurance plans. Other things you can do to manage stress include:  Keeping a stress diary. This can help you learn what triggers your stress and ways to control your response.  Understanding what your limits are and saying no to requests or events that lead to a schedule that is too full.  Thinking about how you respond to certain situations. You may not be able to control everything, but you can control how you react.  Adding humor to your life by watching funny films or TV shows.  Making time for activities that help you relax and not feeling guilty about spending your time this way.  Medicines Your health care provider may suggest certain medicines if he or she feels that they will help improve your condition. Avoid using alcohol and other substances that may prevent your medicines from working properly (may interact). It is also important to:  Talk with your pharmacist or health care provider about all the medicines that you take, their possible side  effects, and what medicines are safe to take together.  Make it your goal to take part in all treatment decisions (shared decision-making). This includes giving input on the side effects of medicines. It is best if shared decision-making with your health care provider is part of your total treatment plan. If your health care provider prescribes a medicine, you may not notice the full benefits of  it for 4-8 weeks. Most people who are treated for depression need to be on medicine for at least 6-12 months after they feel better. If you are taking medicines as part of your treatment, do not stop taking medicines without first talking to your health care provider. You may need to have the medicine slowly decreased (tapered) over time to decrease the risk of harmful side effects. Relationships Your health care provider may suggest family therapy along with individual therapy and drug therapy. While there may not be family problems that are causing you to feel depressed, it is still important to make sure your family learns as much as they can about your mental health. Having your family's support can help make your treatment successful. How to recognize changes in your condition Everyone has a different response to treatment for depression. Recovery from major depression happens when you have not had signs of major depression for two months. This may mean that you will start to:  Have more interest in doing activities.  Feel less hopeless than you did 2 months ago.  Have more energy.  Overeat less often, or have better or improving appetite.  Have better concentration. Your health care provider will work with you to decide the next steps in your recovery. It is also important to recognize when your condition is getting worse. Watch for these signs:  Having fatigue or low energy.  Eating too much or too little.  Sleeping too much or too little.  Feeling restless, agitated, or hopeless.  Having trouble concentrating or making decisions.  Having unexplained physical complaints.  Feeling irritable, angry, or aggressive. Get help as soon as you or your family members notice these symptoms coming back. How to get support and help from others How to talk with friends and family members about your condition  Talking to friends and family members about your condition can provide you with  one way to get support and guidance. Reach out to trusted friends or family members, explain your symptoms to them, and let them know that you are working with a health care provider to treat your depression. Financial resources Not all insurance plans cover mental health care, so it is important to check with your insurance carrier. If paying for co-pays or counseling services is a problem, search for a local or county mental health care center. They may be able to offer public mental health care services at low or no cost when you are not able to see a private health care provider. If you are taking medicine for depression, you may be able to get the generic form, which may be less expensive. Some makers of prescription medicines also offer help to patients who cannot afford the medicines they need. Follow these instructions at home:   Get the right amount and quality of sleep.  Cut down on using caffeine, tobacco, alcohol, and other potentially harmful substances.  Try to exercise, such as walking or lifting small weights.  Take over-the-counter and prescription medicines only as told by your health care provider.  Eat a healthy diet that includes plenty  of vegetables, fruits, whole grains, low-fat dairy products, and lean protein. Do not eat a lot of foods that are high in solid fats, added sugars, or salt.  Keep all follow-up visits as told by your health care provider. This is important. Contact a health care provider if:  You stop taking your antidepressant medicines, and you have any of these symptoms: ? Nausea. ? Headache. ? Feeling lightheaded. ? Chills and body aches. ? Not being able to sleep (insomnia).  You or your friends and family think your depression is getting worse. Get help right away if:  You have thoughts of hurting yourself or others. If you ever feel like you may hurt yourself or others, or have thoughts about taking your own life, get help right away. You can  go to your nearest emergency department or call:  Your local emergency services (911 in the [place]  A suicide crisis helpline, such as the National Suicide Prevention Lifeline at 916-142-3768. This is open 24-hours a day. Summary  If you are living with depression, there are ways to help you recover from it and also ways to prevent it from coming back.  Work with your health care team to create a management plan that includes counseling, stress management techniques, and healthy lifestyle habits. This information is not intended to replace advice given to you by your health care provider. Make sure you discuss any questions you have with your health care provider. Document Revised: 08/04/2018 Document Reviewed: 03/15/2016 Elsevier Patient Education  2020 [employer].

## 2020-03-14 NOTE — Assessment & Plan Note (Signed)
Depression well controlled on current medication no changes necessary.  Provided education to patient with printed handouts given. Rx refill sent to pharmacy.  Follow-up with worsening or unresolved symptoms.

## 2020-03-14 NOTE — Progress Notes (Signed)
Established Patient Office Visit  Subjective:  Patient ID: Allison Rollins Hottle, female    DOB: 1950-10-07  Age: 69 y.o. MRN: [id number]  CC:  Chief Complaint  Patient presents with  . Depression    6 week follow up  . Back Pain    HPI Allison Rollins Apach[place] presents for Depression: Patient complains of depression. She complains of depressed mood. Onset was approximately 4 years ago, gradually improving since that time.  She denies current suicidal and homicidal plan or intent.   Family history significant for no psychiatric illness.Possible organic causes contributing are: none.  Risk factors: previous episode of depression Previous treatment includes Zoloft. She complains of the following side effects from the treatment: none.        Past Medical History:  Diagnosis Date  . Anxiety   . Asthma   . Chest pain 12/22/2015  . CHF (congestive heart failure) (HCC)   . COPD (chronic obstructive pulmonary disease) (HCC)   . Depression   . Headache   . HTN (hypertension)   . Hypokalemia 07/28/2013  . Kidney stones   . Osteopenia 01/22/2020  . Parkinson's disease   . Pneumonia   . Pre-diabetes   . S/P colonoscopy 10/15/04   normal    Past Surgical History:  Procedure Laterality Date  . CHOLECYSTECTOMY    . COLONOSCOPY  10/15/2004   Normal rectum/Normal colon  . COLONOSCOPY N/A 05/10/2016   Procedure: COLONOSCOPY;  Surgeon: West BaliSandi L Fields, MD;  Location: [facility];  Service: Endoscopy;  Laterality: N/A;  10:30  . ESOPHAGEAL MANOMETRY N/A 04/02/2019   Procedure: ESOPHAGEAL MANOMETRY (EM);  Surgeon: Napoleon FormNandigam, Kavitha V, MD;  Location: [facility];  Service: Endoscopy;  Laterality: N/A;  . ESOPHAGOGASTRODUODENOSCOPY  09/11/2010   Dysphagia likely multifactorial (possible Candida esophagitis, likely nonspecific esophageal motility disorder, and/or uncontrolled gastroesophageal reflux disease), status post empiric dilation  . ESOPHAGOGASTRODUODENOSCOPY N/A 11/13/2018   Dr. Darrick PennaFields: No  endoscopic esophageal abnormality to explain patient's dysphagia.  Esophagus dilated.  Moderate erosive/nodular gastritis with benign biopsies.  Marland Kitchen. KIDNEY STONE SURGERY    . SAVORY DILATION N/A 11/13/2018   Procedure: SAVORY DILATION;  Surgeon: West BaliFields, Sandi L, MD;  Location: [facility];  Service: Endoscopy;  Laterality: N/A;    Family History  Problem Relation Age of Onset  . Coronary artery disease Father   . COPD Mother   . Asthma Brother   . Coronary artery disease Sister   . Diabetes Sister   . Coronary artery disease Brother   . Coronary artery disease Sister     Social History   Socioeconomic History  . Marital status: Widowed    Spouse name: Not on file  . Number of children: 2  . Years of education: Not on file  . Highest education level: Not on file  Occupational History  . Occupation: disabled  Tobacco Use  . Smoking status: Former Smoker    Packs/day: 0.25    Years: 30.00    Pack years: 7.50    Types: Cigarettes    Start date: 01/04/1965    Quit date: 01/03/1995    Years since quitting: 25.2  . Smokeless tobacco: Never Used  Vaping Use  . Vaping Use: Never used  Substance and Sexual Activity  . Alcohol use: No    Alcohol/week: 0.0 standard drinks  . Drug use: No  . Sexual activity: Not on file  Other Topics Concern  . Not on file  Social History Narrative  . Not on  file   Social Determinants of Health   Financial Resource Strain:   . Difficulty of Paying Living Expenses: Not on file  Food Insecurity:   . Worried About Programme researcher, broadcasting/film/video in the Last Year: Not on file  . Ran Out of Food in the Last Year: Not on file  Transportation Needs:   . Lack of Transportation (Medical): Not on file  . Lack of Transportation (Non-Medical): Not on file  Physical Activity:   . Days of Exercise per Week: Not on file  . Minutes of Exercise per Session: Not on file  Stress:   . Feeling of Stress : Not on file  Social Connections:   . Frequency of Communication  with Friends and Family: Not on file  . Frequency of Social Gatherings with Friends and Family: Not on file  . Attends Religious Services: Not on file  . Active Member of Clubs or Organizations: Not on file  . Attends Banker Meetings: Not on file  . Marital Status: Not on file  Intimate Partner Violence:   . Fear of Current or Ex-Partner: Not on file  . Emotionally Abused: Not on file  . Physically Abused: Not on file  . Sexually Abused: Not on file    Outpatient Medications Prior to Visit  Medication Sig Dispense Refill  . albuterol (PROVENTIL HFA;VENTOLIN HFA) 108 (90 Base) MCG/ACT inhaler Inhale 2 puffs into the lungs every 6 (six) hours as needed for wheezing. 1 Inhaler 0  . albuterol (PROVENTIL) (2.5 MG/3ML) 0.083% nebulizer solution Take 3 mLs (2.5 mg total) by nebulization every 6 (six) hours as needed for wheezing or shortness of breath. 150 mL 1  . Alcohol Swabs (B-D SINGLE USE SWABS REGULAR) PADS Test sugars daily 100 each 3  . aspirin EC 81 MG tablet Take 1 tablet (81 mg total) by mouth daily.    Marland Kitchen atorvastatin (LIPITOR) 40 MG tablet Take 1 tablet (40 mg total) by mouth daily. 90 tablet 1  . cetirizine (ZYRTEC) 10 MG tablet Take 1 tablet (10 mg total) by mouth daily. 90 tablet 1  . cholecalciferol (VITAMIN D3) 25 MCG (1000 UT) tablet Take 1,000 Units by mouth 2 (two) times daily.     . dapagliflozin propanediol (FARXIGA) 10 MG TABS tablet Take 1 tablet (10 mg total) by mouth daily before breakfast. 30 tablet 3  . fluticasone (FLONASE) 50 MCG/ACT nasal spray Place 2 sprays into both nostrils daily. 16 g 6  . furosemide (LASIX) 20 MG tablet Take 1 tablet (20 mg total) by mouth daily as needed. 90 tablet 1  . gabapentin (NEURONTIN) 300 MG capsule Take 1 capsule (300 mg total) by mouth in the morning AND 2 capsules (600 mg total) at bedtime. 270 capsule 1  . glucose blood (ACCU-CHEK AVIVA PLUS) test strip Test sugars daily 100 each 3  . lisinopril (ZESTRIL) 10 MG  tablet Take 1 tablet (10 mg total) by mouth daily. 90 tablet 1  . methocarbamol (ROBAXIN) 500 MG tablet Take 1 tablet (500 mg total) by mouth 3 (three) times daily as needed for muscle spasms. 30 tablet 2  . Multiple Vitamin (MULTIVITAMIN WITH MINERALS) TABS tablet Take 1 tablet by mouth daily.    . nitroGLYCERIN (NITROSTAT) 0.4 MG SL tablet Place 1 tablet (0.4 mg total) under the tongue every 5 (five) minutes as needed for chest pain. 20 tablet 3  . nystatin cream (MYCOSTATIN) Apply 1 application topically twice daily 30 g 11  . Omega-3 Fatty Acids (  FISH OIL PO) Take 1 capsule by mouth daily.     . pantoprazole (PROTONIX) 40 MG tablet TAKE ONE TABLET BY MOUTH TWICE DAILY 60 tablet 11  . potassium chloride SA (K-DUR) 20 MEQ tablet Take 20 mEq by mouth daily as needed (with lasix).    . pramipexole (MIRAPEX) 0.5 MG tablet Take 1 tablet by mouth at bedtime 30 tablet 0  . sertraline (ZOLOFT) 100 MG tablet Take 1.5 tablets (150 mg total) by mouth daily. 135 tablet 1   No facility-administered medications prior to visit.    Allergies  Allergen Reactions  . Penicillins Itching and Rash    Has patient had a PCN reaction causing immediate rash, facial/tongue/throat swelling, SOB or lightheadedness with hypotension: no Has patient had a PCN reaction causing severe rash involving mucus membranes or skin necrosis: no Has patient had a PCN reaction that required hospitalization: no Has patient had a PCN reaction occurring within the last 10 years: no If all of the above answers are "NO", then may proceed with Cephalosporin use.   . Sulfa Antibiotics Rash    ROS Review of Systems  Psychiatric/Behavioral: Negative for self-injury, sleep disturbance and suicidal ideas. The patient is nervous/anxious.        Depression  All other systems reviewed and are negative.     Objective:    Physical Exam Vitals reviewed.  Constitutional:      Appearance: Normal appearance. She is obese.  HENT:      Head: Normocephalic.  Eyes:     Conjunctiva/sclera: Conjunctivae normal.  Cardiovascular:     Rate and Rhythm: Normal rate and regular rhythm.     Pulses: Normal pulses.     Heart sounds: Normal heart sounds.  Pulmonary:     Effort: Pulmonary effort is normal.     Breath sounds: Normal breath sounds.  Musculoskeletal:        General: Tenderness present.  Skin:    General: Skin is dry.  Neurological:     Mental Status: She is alert and oriented to person, place, and time.  Psychiatric:     Comments: Anxiety and depression     BP 140/87   Pulse 62   Temp (!) 96.3 F (35.7 C) (Temporal)   Ht 5\' 6"  (1.676 m)   Wt 223 lb (101.2 kg)   SpO2 94%   BMI 35.99 kg/m  Wt Readings from Last 3 Encounters:  03/14/20 223 lb (101.2 kg)  03/10/20 224 lb (101.6 kg)  02/28/20 223 lb (101.2 kg)     There are no preventive care reminders to display for this patient.  There are no preventive care reminders to display for this patient.  Lab Results  Component Value Date   TSH 2.350 11/24/2018   Lab Results  Component Value Date   WBC 8.9 07/17/2019   HGB 14.6 07/17/2019   HCT 43.3 07/17/2019   MCV 87.5 07/17/2019   PLT 245 07/17/2019   Lab Results  Component Value Date   NA 146 (H) 01/22/2020   K 4.3 01/22/2020   CO2 26 01/22/2020   GLUCOSE 108 (H) 01/22/2020   BUN 17 01/22/2020   CREATININE 1.08 (H) 01/22/2020   BILITOT 0.4 01/22/2020   ALKPHOS 103 01/22/2020   AST 22 01/22/2020   ALT 15 01/22/2020   PROT 6.6 01/22/2020   ALBUMIN 4.3 01/22/2020   CALCIUM 9.4 01/22/2020   ANIONGAP 13 07/17/2019   Lab Results  Component Value Date   CHOL 182 01/22/2020  Lab Results  Component Value Date   HDL 47 01/22/2020   Lab Results  Component Value Date   LDLCALC 108 (H) 01/22/2020   Lab Results  Component Value Date   TRIG 154 (H) 01/22/2020   Lab Results  Component Value Date   CHOLHDL 3.9 01/22/2020   Lab Results  Component Value Date   HGBA1C 5.6 01/22/2020        Assessment & Plan:  Recurrent depression (HCC) Depression well controlled on current medication no changes necessary.  Provided education to patient with printed handouts given. Rx refill sent to pharmacy.  Follow-up with worsening or unresolved symptoms.      Problem List Items Addressed This Visit      Cardiovascular and Mediastinum   Chronic diastolic heart failure (HCC) (Chronic)   Relevant Medications   lisinopril (ZESTRIL) 10 MG tablet   furosemide (LASIX) 20 MG tablet   atorvastatin (LIPITOR) 40 MG tablet     Respiratory   Seasonal allergic rhinitis due to pollen   Relevant Medications   cetirizine (ZYRTEC) 10 MG tablet     Nervous and Auditory   Neuropathy   Relevant Medications   gabapentin (NEURONTIN) 300 MG capsule     Other   Recurrent depression (HCC) - Primary    Depression well controlled on current medication no changes necessary.  Provided education to patient with printed handouts given. Rx refill sent to pharmacy.  Follow-up with worsening or unresolved symptoms.           Other Visit Diagnoses    Benign hypertension       Relevant Medications   lisinopril (ZESTRIL) 10 MG tablet   furosemide (LASIX) 20 MG tablet   atorvastatin (LIPITOR) 40 MG tablet   Hyperlipidemia associated with type 2 diabetes mellitus (HCC)       Relevant Medications   lisinopril (ZESTRIL) 10 MG tablet   atorvastatin (LIPITOR) 40 MG tablet      Meds ordered this encounter  Medications  . lisinopril (ZESTRIL) 10 MG tablet    Sig: Take 1 tablet (10 mg total) by mouth daily.    Dispense:  90 tablet    Refill:  1  . furosemide (LASIX) 20 MG tablet    Sig: Take 1 tablet (20 mg total) by mouth daily as needed.    Dispense:  90 tablet    Refill:  1  . atorvastatin (LIPITOR) 40 MG tablet    Sig: Take 1 tablet (40 mg total) by mouth daily.    Dispense:  90 tablet    Refill:  1  . gabapentin (NEURONTIN) 300 MG capsule    Sig: Take 1 capsule (300 mg total) by  mouth in the morning AND 2 capsules (600 mg total) at bedtime.    Dispense:  270 capsule    Refill:  1  . cetirizine (ZYRTEC) 10 MG tablet    Sig: Take 1 tablet (10 mg total) by mouth daily.    Dispense:  90 tablet    Refill:  1    Follow-up: Return in about 3 months (around 06/14/2020).    Daryll Drown, NP

## 2020-03-18 ENCOUNTER — Other Ambulatory Visit: Payer: Medicare Other

## 2020-03-18 ENCOUNTER — Other Ambulatory Visit: Payer: Self-pay

## 2020-03-18 ENCOUNTER — Other Ambulatory Visit: Payer: Self-pay | Admitting: Nurse Practitioner

## 2020-03-18 DIAGNOSIS — I10 Essential (primary) hypertension: Secondary | ICD-10-CM | POA: Diagnosis not present

## 2020-03-18 DIAGNOSIS — E782 Mixed hyperlipidemia: Secondary | ICD-10-CM

## 2020-03-19 ENCOUNTER — Telehealth: Payer: Self-pay

## 2020-03-19 LAB — LIPID PANEL
Chol/HDL Ratio: 3.4 ratio (ref 0.0–4.4)
Cholesterol, Total: 179 mg/dL (ref 100–199)
HDL: 53 mg/dL (ref >39)
LDL Chol Calc (NIH): 103 mg/dL — ABNORMAL HIGH (ref 0–99)
Triglycerides: 133 mg/dL (ref 0–149)
VLDL Cholesterol Cal: 23 mg/dL (ref 5–40)

## 2020-03-19 LAB — COMPREHENSIVE METABOLIC PANEL
ALT: 17 IU/L (ref 0–32)
AST: 16 IU/L (ref 0–40)
Albumin/Globulin Ratio: 1.7 (ref 1.2–2.2)
Albumin: 4 g/dL (ref 3.8–4.8)
Alkaline Phosphatase: 101 IU/L (ref 44–121)
BUN/Creatinine Ratio: 20 (ref 12–28)
BUN: 16 mg/dL (ref 8–27)
Bilirubin Total: 0.5 mg/dL (ref 0.0–1.2)
CO2: 27 mmol/L (ref 20–29)
Calcium: 9.6 mg/dL (ref 8.7–10.3)
Chloride: 103 mmol/L (ref 96–106)
Creatinine, Ser: 0.82 mg/dL (ref 0.57–1.00)
GFR calc Af Amer: 84 mL/min/{1.73_m2} (ref >59)
GFR calc non Af Amer: 73 mL/min/{1.73_m2} (ref >59)
Globulin, Total: 2.4 g/dL (ref 1.5–4.5)
Glucose: 94 mg/dL (ref 65–99)
Potassium: 4.8 mmol/L (ref 3.5–5.2)
Sodium: 144 mmol/L (ref 134–144)
Total Protein: 6.4 g/dL (ref 6.0–8.5)

## 2020-03-19 LAB — CBC WITH DIFFERENTIAL/PLATELET
Basophils Absolute: 0 10*3/uL (ref 0.0–0.2)
Basos: 0 %
EOS (ABSOLUTE): 0.1 10*3/uL (ref 0.0–0.4)
Eos: 2 %
Hematocrit: 43.8 % (ref 34.0–46.6)
Hemoglobin: 14.8 g/dL (ref 11.1–15.9)
Immature Grans (Abs): 0 10*3/uL (ref 0.0–0.1)
Immature Granulocytes: 1 %
Lymphocytes Absolute: 1.1 10*3/uL (ref 0.7–3.1)
Lymphs: 18 %
MCH: 30 pg (ref 26.6–33.0)
MCHC: 33.8 g/dL (ref 31.5–35.7)
MCV: 89 fL (ref 79–97)
Monocytes Absolute: 0.4 10*3/uL (ref 0.1–0.9)
Monocytes: 6 %
Neutrophils Absolute: 4.6 10*3/uL (ref 1.4–7.0)
Neutrophils: 73 %
Platelets: 200 10*3/uL (ref 150–450)
RBC: 4.94 x10E6/uL (ref 3.77–5.28)
RDW: 13.5 % (ref 11.7–15.4)
WBC: 6.3 10*3/uL (ref 3.4–10.8)

## 2020-03-19 NOTE — Telephone Encounter (Signed)
Please review labs. 

## 2020-03-25 MED ORDER — DAPAGLIFLOZIN PROPANEDIOL 10 MG PO TABS: 10.0000 mg | ORAL_TABLET | Freq: Every day | ORAL | 3 refills | Status: DC

## 2020-03-25 NOTE — Telephone Encounter (Signed)
Call returned to patient Discussed medication (farxiga) Patient to receive free supply via [place] patient assistance program Medication will ship to patient's home RX escribed to [facility] with 3 refills

## 2020-04-02 ENCOUNTER — Other Ambulatory Visit: Payer: Self-pay | Admitting: Family Medicine

## 2020-04-02 DIAGNOSIS — J301 Allergic rhinitis due to pollen: Secondary | ICD-10-CM

## 2020-04-17 ENCOUNTER — Other Ambulatory Visit: Payer: Self-pay | Admitting: *Deleted

## 2020-04-17 DIAGNOSIS — G2581 Restless legs syndrome: Secondary | ICD-10-CM

## 2020-04-17 MED ORDER — PRAMIPEXOLE DIHYDROCHLORIDE 0.5 MG PO TABS: 0.5000 mg | ORAL_TABLET | Freq: Every day | ORAL | 1 refills | Status: DC

## 2020-05-01 ENCOUNTER — Telehealth: Payer: Self-pay | Admitting: *Deleted

## 2020-05-01 NOTE — Chronic Care Management (AMB) (Signed)
  Chronic Care Management   Note  05/01/2020 Name: Allison Rollins MRN: [id number] DOB: 02/23/1951  Allison Rollins is a 70 y.o. year old female who is a primary care patient of Loman Brooklyn, FNP. I reached out to Allison Rollins by phone today in response to a referral sent by Ms. Juanita Craver Scearce's health plan.     Ms. Welford was given information about Chronic Care Management services today including:  1. CCM service includes personalized support from designated clinical staff supervised by her physician, including individualized plan of care and coordination with other care providers 2. 24/7 contact phone numbers for assistance for urgent and routine care needs. 3. Service will only be billed when office clinical staff spend 20 minutes or more in a month to coordinate care. 4. Only one practitioner may furnish and bill the service in a calendar month. 5. The patient may stop CCM services at any time (effective at the end of the month) by phone call to the office staff. 6. The patient will be responsible for cost sharing (co-pay) of up to 20% of the service fee (after annual deductible is met).  Patient agreed to services and verbal consent obtained.   Follow up plan: Telephone appointment with care management team member scheduled for:05/20/2020  [facility]

## 2020-05-20 ENCOUNTER — Telehealth: Payer: Self-pay | Admitting: *Deleted

## 2020-05-20 ENCOUNTER — Telehealth: Payer: Medicare Other | Admitting: *Deleted

## 2020-05-20 NOTE — Telephone Encounter (Signed)
  Chronic Care Management   Outreach Note  05/20/2020 Name: Allison Rollins MRN: [id number] DOB: Aug 28, 1950  Referred by: Gwenlyn Fudge, FNP Reason for referral : No chief complaint on file.   An unsuccessful Initial Telephone Visit was attempted today. The patient was referred to the case management team for assistance with care management and care coordination.   Clinical Goals: . Over the next 30 days, patient will be contacted by a Care Guide to reschedule their CCM Visit  Interventions and Plan . Chart reviewed in preparation for telephone visit . Collaboration with other care team members as needed . Request sent to care guides to reach out and reschedule patient's telephone visit   Demetrios Loll, BSN, RN-BC Embedded Chronic Care Manager [facility] Care Management Direct Dial: [phone]

## 2020-05-21 NOTE — Telephone Encounter (Signed)
R/s

## 2020-05-22 ENCOUNTER — Telehealth: Payer: Self-pay | Admitting: *Deleted

## 2020-05-22 NOTE — Chronic Care Management (AMB) (Signed)
  Care Management   Note  05/22/2020 Name: [name] MRN: [id number] DOB: 01-23-1951  Allison Rollins is a 70 y.o. year old female who is a primary care patient of Gwenlyn Fudge, FNP and is actively engaged with the care management team. I reached out to Allison Rollins by phone today to assist with re-scheduling an initial visit with the RN Case Manager  Follow up plan: Unsuccessful telephone outreach attempt made. A HIPAA compliant phone message was left for the patient providing contact information and requesting a return call. The care management team will reach out to the patient again over the next 7 days. If patient returns call to provider office, please advise to call Embedded Care Management Care Guide Gwenevere Ghazi at [phone].  Gwenevere Ghazi  Care Guide, Embedded Care Coordination [facility] Management

## 2020-05-22 NOTE — Chronic Care Management (AMB) (Signed)
  Care Management   Note  05/22/2020 Name: Allison Rollins MRN: [id number] DOB: 02-25-1951  Allison Rollins is a 70 y.o. year old female who is a primary care patient of Gwenlyn Fudge, FNP and is actively engaged with the care management team. I reached out to Allison Rollins by phone today to assist with re-scheduling an initial visit with the RN Case Manager  Follow up plan: Telephone appointment with care management team member scheduled for:06/09/2020  [facility] Guide, Embedded Care Coordination [facility] Management

## 2020-05-27 ENCOUNTER — Telehealth: Payer: Self-pay

## 2020-05-27 DIAGNOSIS — K219 Gastro-esophageal reflux disease without esophagitis: Secondary | ICD-10-CM

## 2020-05-27 DIAGNOSIS — G2581 Restless legs syndrome: Secondary | ICD-10-CM

## 2020-05-27 MED ORDER — PANTOPRAZOLE SODIUM 40 MG PO TBEC: 40.0000 mg | DELAYED_RELEASE_TABLET | Freq: Two times a day (BID) | ORAL | 0 refills | Status: DC

## 2020-05-27 MED ORDER — PRAMIPEXOLE DIHYDROCHLORIDE 0.5 MG PO TABS: 0.5000 mg | ORAL_TABLET | Freq: Every day | ORAL | 0 refills | Status: DC

## 2020-05-27 NOTE — Telephone Encounter (Signed)
  Prescription Request  05/27/2020  What is the name of the medication or equipment? Acid Reflux Meds=Pramipexole  Have you contacted your pharmacy to request a refill? (if applicable) yes  Which pharmacy would you like this sent to? [facility] [phone]   Patient notified that their request is being sent to the clinical staff for review and that they should receive a response within 2 business days.   Joyce's pt.

## 2020-05-27 NOTE — Telephone Encounter (Signed)
Pt aware refills sent to [facility]

## 2020-06-01 ENCOUNTER — Other Ambulatory Visit: Payer: Self-pay | Admitting: Family Medicine

## 2020-06-01 DIAGNOSIS — F339 Major depressive disorder, recurrent, unspecified: Secondary | ICD-10-CM

## 2020-06-01 DIAGNOSIS — K219 Gastro-esophageal reflux disease without esophagitis: Secondary | ICD-10-CM

## 2020-06-04 ENCOUNTER — Other Ambulatory Visit: Payer: Self-pay | Admitting: Family Medicine

## 2020-06-04 DIAGNOSIS — K219 Gastro-esophageal reflux disease without esophagitis: Secondary | ICD-10-CM

## 2020-06-09 ENCOUNTER — Telehealth: Payer: Medicare Other | Admitting: *Deleted

## 2020-06-09 ENCOUNTER — Telehealth: Payer: Self-pay | Admitting: *Deleted

## 2020-06-09 NOTE — Telephone Encounter (Signed)
  Chronic Care Management   Outreach Note  06/09/2020 Name: Allison Rollins MRN: [id number] DOB: [birth year]  Referred by: Gwenlyn Fudge, FNP Reason for referral : Chronic Care Management (Initial Visit)   A second unsuccessful Initial Telephone Visit was attempted today. The patient was referred to the case management team for assistance with care management and care coordination.   Clinical Goals: . Over the next 30 days, patient will be contacted by a Care Guide to reschedule their CCM Visit  Interventions and Plan . Chart reviewed in preparation for telephone visit . Collaboration with other care team members as needed . A HIPAA compliant phone message was left for the patient providing contact information and requesting a return call.  . Request sent to care guides to reach out and reschedule patient's telephone visit   Demetrios Loll, BSN, RN-BC Embedded Chronic Care Manager [facility] Care Management Direct Dial: [phone]

## 2020-06-17 ENCOUNTER — Encounter: Payer: Self-pay | Admitting: Family Medicine

## 2020-06-17 ENCOUNTER — Other Ambulatory Visit: Payer: Self-pay

## 2020-06-17 ENCOUNTER — Ambulatory Visit (INDEPENDENT_AMBULATORY_CARE_PROVIDER_SITE_OTHER): Payer: Medicare Other | Admitting: Family Medicine

## 2020-06-17 VITALS — BP 138/84 | HR 55 | Temp 97.9°F | Ht 66.0 in | Wt 232.4 lb

## 2020-06-17 DIAGNOSIS — R5383 Other fatigue: Secondary | ICD-10-CM

## 2020-06-17 DIAGNOSIS — F339 Major depressive disorder, recurrent, unspecified: Secondary | ICD-10-CM

## 2020-06-17 DIAGNOSIS — E669 Obesity, unspecified: Secondary | ICD-10-CM

## 2020-06-17 DIAGNOSIS — E559 Vitamin D deficiency, unspecified: Secondary | ICD-10-CM

## 2020-06-17 DIAGNOSIS — I5032 Chronic diastolic (congestive) heart failure: Secondary | ICD-10-CM

## 2020-06-17 DIAGNOSIS — R7303 Prediabetes: Secondary | ICD-10-CM | POA: Diagnosis not present

## 2020-06-17 DIAGNOSIS — G479 Sleep disorder, unspecified: Secondary | ICD-10-CM

## 2020-06-17 DIAGNOSIS — F411 Generalized anxiety disorder: Secondary | ICD-10-CM | POA: Diagnosis not present

## 2020-06-17 DIAGNOSIS — N1831 Chronic kidney disease, stage 3a: Secondary | ICD-10-CM

## 2020-06-17 DIAGNOSIS — R5381 Other malaise: Secondary | ICD-10-CM

## 2020-06-17 LAB — BAYER DCA HB A1C WAIVED: HB A1C (BAYER DCA - WAIVED): 5.7 % (ref <7.0)

## 2020-06-17 MED ORDER — DAPAGLIFLOZIN PROPANEDIOL 10 MG PO TABS: 10.0000 mg | ORAL_TABLET | Freq: Every day | ORAL | 1 refills | Status: DC

## 2020-06-17 MED ORDER — SERTRALINE HCL 100 MG PO TABS
ORAL_TABLET | ORAL | 1 refills | Status: DC
Start: 2020-06-17 — End: 2021-01-01

## 2020-06-17 NOTE — Progress Notes (Addendum)
Assessment & Plan:  1. Recurrent depression ([place]) Well controlled on current regimen.  - sertraline (ZOLOFT) 100 MG tablet; Take 1.5 tablets by mouth every day  Dispense: 135 tablet; Refill: 1 - CMP14+EGFR  2. Generalized anxiety disorder Well controlled on current regimen.  - sertraline (ZOLOFT) 100 MG tablet; Take 1.5 tablets by mouth every day  Dispense: 135 tablet; Refill: 1 - CMP14+EGFR  3. Prediabetes Labs to assess. - CMP14+EGFR - Bayer DCA Hb A1c Waived  4. Vitamin D deficiency Well controlled on current regimen.  - VITAMIN D 25 Hydroxy (Vit-D Deficiency, Fractures)  5. Chronic diastolic heart failure (HCC) Well controlled on current regimen.  - dapagliflozin propanediol (FARXIGA) 10 MG TABS tablet; Take 1 tablet (10 mg total) by mouth daily before breakfast.  Dispense: 90 tablet; Refill: 1 - CMP14+EGFR  6. Stage 3a chronic kidney disease ([place]) Well controlled on current regimen.  - dapagliflozin propanediol (FARXIGA) 10 MG TABS tablet; Take 1 tablet (10 mg total) by mouth daily before breakfast.  Dispense: 90 tablet; Refill: 1 - CMP14+EGFR  7. Obesity (BMI 30-39.9) Patient has gained weight since her last visit. Encouraged diet and exercise.  - CMP14+EGFR - Thyroid Panel With TSH  8. Fatigue, unspecified type Labs to assess for cause but discussed importance of PT for physical deconditioning from her hospital stay.  - Anemia Profile B - CMP14+EGFR - Thyroid Panel With TSH - VITAMIN D 25 Hydroxy (Vit-D Deficiency, Fractures)  9. Physical deconditioning - Ambulatory referral to Physical Therapy  10. Difficulty Sleeping Advised to try over-the-counter sleep aids such as Unisom, melatonin, and ZzzQuil prior to starting a prescription medication to help with sleep.   Return in about 3 months (around 09/14/2020) for follow-up of chronic medication conditions.  [name], MSN, APRN, FNP-C [facility]  Subjective:    Patient ID:  Allison Rollins, female    DOB: 05-26-1950, 37 y.o.   MRN: [id number]  Patient Care Team: Allison Brooklyn, FNP as PCP - General (Family Medicine) Allison Rollins Alphonse Guild, MD as PCP - Cardiology (Cardiology) Allison Binder, MD (Inactive) (Gastroenterology) Allison Odor, MD as Consulting Physician (Neurology) Allison Rollins, Alphonse Guild, MD as Consulting Physician (Cardiology) Allison China, RN as [place] Management   Chief Complaint:  Chief Complaint  Patient presents with  . Hypertension  . Depression    3 month follow up of chronic medical conditions   . Fatigue    Patient states this has been going on a few months.     HPI: [name] is a 70 y.o. female presenting on 06/17/2020 for Hypertension, Depression (3 month follow up of chronic medical conditions/), and Fatigue (Patient states this has been going on a few months./)  Depression: Patient feels her depression is well controlled on sertraline.  She does report she is having trouble sleeping.  She has not tried any over-the-counter sleep aids.  Depression screen [facility] 2/9 06/17/2020 03/14/2020 02/28/2020  Decreased Interest 0 0 0  Down, Depressed, Hopeless 0 1 1  PHQ - 2 Score 0 1 1  Altered sleeping 2 0 -  Tired, decreased energy 2 1 -  Change in appetite 0 0 -  Feeling bad or failure about yourself  0 0 -  Trouble concentrating 0 0 -  Moving slowly or fidgety/restless 0 2 -  Suicidal thoughts 0 0 -  PHQ-9 Score 4 4 -  Difficult doing work/chores Not difficult at all - -  Some recent data  might be hidden   GAD 7 : Generalized Anxiety Score 06/17/2020 03/14/2020 01/22/2020 10/19/2019  Nervous, Anxious, on Edge 1 1 2  0  Control/stop worrying 1 2 3  0  Worry too much - different things 1 1 3  0  Trouble relaxing 1 0 1 0  Restless 1 0 3 0  Easily annoyed or irritable 0 0 1 0  Afraid - awful might happen 0 0 0 0  Total GAD 7 Score 5 4 13  0  Anxiety Difficulty Not difficult at all - - -    New  complaints: Patient reports she has been feeling fatigued since she was hospitalized a few months ago.  She has had 1 fall during this time.  She tripped over her cat.  Social history:  Relevant past medical, surgical, family and social history reviewed and updated as indicated. Interim medical history since our last visit reviewed.  Allergies and medications reviewed and updated.  DATA REVIEWED: CHART IN EPIC  ROS: Negative unless specifically indicated above in HPI.    Current Outpatient Medications:  .  albuterol (PROVENTIL HFA;VENTOLIN HFA) 108 (90 Base) MCG/ACT inhaler, Inhale 2 puffs into the lungs every 6 (six) hours as needed for wheezing., Disp: 1 Inhaler, Rfl: 0 .  albuterol (PROVENTIL) (2.5 MG/3ML) 0.083% nebulizer solution, Take 3 mLs (2.5 mg total) by nebulization every 6 (six) hours as needed for wheezing or shortness of breath., Disp: 150 mL, Rfl: 1 .  Alcohol Swabs (B-D SINGLE USE SWABS REGULAR) PADS, Test sugars daily, Disp: 100 each, Rfl: 3 .  aspirin EC 81 MG tablet, Take 1 tablet (81 mg total) by mouth daily., Disp: , Rfl:  .  atorvastatin (LIPITOR) 40 MG tablet, Take 1 tablet (40 mg total) by mouth daily., Disp: 90 tablet, Rfl: 1 .  cetirizine (ZYRTEC) 10 MG tablet, Take 1 tablet (10 mg total) by mouth daily., Disp: 90 tablet, Rfl: 1 .  cholecalciferol (VITAMIN D3) 25 MCG (1000 UT) tablet, Take 1,000 Units by mouth 2 (two) times daily. , Disp: , Rfl:  .  dapagliflozin propanediol (FARXIGA) 10 MG TABS tablet, Take 1 tablet (10 mg total) by mouth daily before breakfast., Disp: 30 tablet, Rfl: 3 .  fluticasone (FLONASE) 50 MCG/ACT nasal spray, Use 2 sprays into each nostril every day, Disp: 16 g, Rfl: 5 .  furosemide (LASIX) 20 MG tablet, Take 1 tablet (20 mg total) by mouth daily as needed., Disp: 90 tablet, Rfl: 1 .  gabapentin (NEURONTIN) 300 MG capsule, Take 1 capsule (300 mg total) by mouth in the morning AND 2 capsules (600 mg total) at bedtime., Disp: 270 capsule,  Rfl: 1 .  glucose blood (ACCU-CHEK AVIVA PLUS) test strip, Test sugars daily, Disp: 100 each, Rfl: 3 .  lisinopril (ZESTRIL) 10 MG tablet, Take 1 tablet (10 mg total) by mouth daily., Disp: 90 tablet, Rfl: 1 .  methocarbamol (ROBAXIN) 500 MG tablet, Take 1 tablet (500 mg total) by mouth 3 (three) times daily as needed for muscle spasms., Disp: 30 tablet, Rfl: 2 .  Multiple Vitamin (MULTIVITAMIN WITH MINERALS) TABS tablet, Take 1 tablet by mouth daily., Disp: , Rfl:  .  nitroGLYCERIN (NITROSTAT) 0.4 MG SL tablet, Place 1 tablet (0.4 mg total) under the tongue every 5 (five) minutes as needed for chest pain., Disp: 20 tablet, Rfl: 3 .  nystatin cream (MYCOSTATIN), Apply 1 application topically twice daily, Disp: 30 g, Rfl: 11 .  Omega-3 Fatty Acids (FISH OIL PO), Take 1 capsule by mouth daily. ,  Disp: , Rfl:  .  pantoprazole (PROTONIX) 40 MG tablet, Take 1 tablet by mouth twice daily, Disp: 60 tablet, Rfl: 0 .  potassium chloride SA (K-DUR) 20 MEQ tablet, Take 20 mEq by mouth daily as needed (with lasix)., Disp: , Rfl:  .  pramipexole (MIRAPEX) 0.5 MG tablet, Take 1 tablet (0.5 mg total) by mouth at bedtime., Disp: 30 tablet, Rfl: 0 .  sertraline (ZOLOFT) 100 MG tablet, Take 1.5 tablets by mouth every day, Disp: 45 tablet, Rfl: 0   Allergies  Allergen Reactions  . Penicillins Itching and Rash    Has patient had a PCN reaction causing immediate rash, facial/tongue/throat swelling, SOB or lightheadedness with hypotension: no Has patient had a PCN reaction causing severe rash involving mucus membranes or skin necrosis: no Has patient had a PCN reaction that required hospitalization: no Has patient had a PCN reaction occurring within the last 10 years: no If all of the above answers are "NO", then may proceed with Cephalosporin use.   . Sulfa Antibiotics Rash   Past Medical History:  Diagnosis Date  . Anxiety   . Asthma   . Chest pain 12/22/2015  . CHF (congestive heart failure) ([place])   . COPD  (chronic obstructive pulmonary disease) ([place])   . Depression   . Headache   . HTN (hypertension)   . Hypokalemia 07/28/2013  . Kidney stones   . Osteopenia 01/22/2020  . Parkinson's disease   . Pneumonia   . Pre-diabetes   . S/P colonoscopy 10/15/04   normal    Past Surgical History:  Procedure Laterality Date  . CHOLECYSTECTOMY    . COLONOSCOPY  10/15/2004   Normal rectum/Normal colon  . COLONOSCOPY N/A 05/10/2016   Procedure: COLONOSCOPY;  Surgeon: Allison Binder, MD;  [facility];  Service: Endoscopy;  Laterality: N/A;  10:30  . ESOPHAGEAL MANOMETRY N/A 04/02/2019   Procedure: ESOPHAGEAL MANOMETRY (EM);  Surgeon: Mauri Pole, MD;  [facility];  Service: Endoscopy;  Laterality: N/A;  . ESOPHAGOGASTRODUODENOSCOPY  09/11/2010   Dysphagia likely multifactorial (possible Candida esophagitis, likely nonspecific esophageal motility disorder, and/or uncontrolled gastroesophageal reflux disease), status post empiric dilation  . ESOPHAGOGASTRODUODENOSCOPY N/A 11/13/2018   Dr. Oneida Alar: No endoscopic esophageal abnormality to explain patient's dysphagia.  Esophagus dilated.  Moderate erosive/nodular gastritis with benign biopsies.  Marland Kitchen KIDNEY STONE SURGERY    . SAVORY DILATION N/A 11/13/2018   Procedure: SAVORY DILATION;  Surgeon: Allison Binder, MD;  [facility];  Service: Endoscopy;  Laterality: N/A;    Social History   Socioeconomic History  . Marital status: Widowed    Spouse name: Not on file  . Number of children: 2  . Years of education: Not on file  . Highest education level: Not on file  Occupational History  . Occupation: disabled  Tobacco Use  . Smoking status: Former Smoker    Packs/day: 0.25    Years: 30.00    Pack years: 7.50    Types: Cigarettes    Start date: 01/04/1965    Quit date: 01/03/1995    Years since quitting: 25.4  . Smokeless tobacco: Never Used  Vaping Use  . Vaping Use: Never used  Substance and Sexual Activity  .  Alcohol use: No    Alcohol/week: 0.0 standard drinks  . Drug use: No  . Sexual activity: Not on file  Other Topics Concern  . Not on file  Social History Narrative  . Not on file   Social  Determinants of Health   Financial Resource Strain: Not on file  Food Insecurity: Not on file  Transportation Needs: Not on file  Physical Activity: Not on file  Stress: Not on file  Social Connections: Not on file  Intimate Partner Violence: Not on file        Objective:    BP 138/84   Pulse (!) 55   Temp 97.9 F (36.6 C) (Temporal)   Ht 5' 6"  (1.676 m)   Wt 232 lb 6.4 oz (105.4 kg)   SpO2 94%   BMI 37.51 kg/m   Wt Readings from Last 3 Encounters:  06/17/20 232 lb 6.4 oz (105.4 kg)  03/14/20 223 lb (101.2 kg)  03/10/20 224 lb (101.6 kg)    Physical Exam Vitals reviewed.  Constitutional:      General: She is not in acute distress.    Appearance: Normal appearance. She is not ill-appearing, toxic-appearing or diaphoretic.  HENT:     Head: Normocephalic and atraumatic.  Eyes:     General: No scleral icterus.       Right eye: No discharge.        Left eye: No discharge.     Conjunctiva/sclera: Conjunctivae normal.  Cardiovascular:     Rate and Rhythm: Normal rate and regular rhythm.     Heart sounds: Normal heart sounds. No murmur heard. No friction rub. No gallop.   Pulmonary:     Effort: Pulmonary effort is normal. No respiratory distress.     Breath sounds: Normal breath sounds. No stridor. No wheezing, rhonchi or rales.  Musculoskeletal:        General: Normal range of motion.     Cervical back: Normal range of motion.  Skin:    General: Skin is warm and dry.     Capillary Refill: Capillary refill takes less than 2 seconds.  Neurological:     General: No focal deficit present.     Mental Status: She is alert and oriented to person, place, and time. Mental status is at baseline.     Gait: Gait abnormal (ambulates with a cane).  Psychiatric:        Mood and Affect:  Mood normal.        Behavior: Behavior normal.        Thought Content: Thought content normal.        Judgment: Judgment normal.     Lab Results  Component Value Date   TSH 2.350 11/24/2018   Lab Results  Component Value Date   WBC 6.3 03/18/2020   HGB 14.8 03/18/2020   HCT 43.8 03/18/2020   MCV 89 03/18/2020   PLT 200 03/18/2020   Lab Results  Component Value Date   NA 144 03/18/2020   K 4.8 03/18/2020   CO2 27 03/18/2020   GLUCOSE 94 03/18/2020   BUN 16 03/18/2020   CREATININE 0.82 03/18/2020   BILITOT 0.5 03/18/2020   ALKPHOS 101 03/18/2020   AST 16 03/18/2020   ALT 17 03/18/2020   PROT 6.4 03/18/2020   ALBUMIN 4.0 03/18/2020   CALCIUM 9.6 03/18/2020   ANIONGAP 13 07/17/2019   Lab Results  Component Value Date   CHOL 179 03/18/2020   Lab Results  Component Value Date   HDL 53 03/18/2020   Lab Results  Component Value Date   LDLCALC 103 (H) 03/18/2020   Lab Results  Component Value Date   TRIG 133 03/18/2020   Lab Results  Component Value Date   CHOLHDL 3.4 03/18/2020  Lab Results  Component Value Date   HGBA1C 5.6 01/22/2020

## 2020-06-17 NOTE — Patient Instructions (Signed)
Unisom, Melatonin (3-10 mg), or Zzzquil for sleep.

## 2020-06-18 ENCOUNTER — Ambulatory Visit (INDEPENDENT_AMBULATORY_CARE_PROVIDER_SITE_OTHER): Payer: Medicare Other

## 2020-06-18 DIAGNOSIS — Z23 Encounter for immunization: Secondary | ICD-10-CM

## 2020-06-18 LAB — CMP14+EGFR
ALT: 16 IU/L (ref 0–32)
AST: 24 IU/L (ref 0–40)
Albumin/Globulin Ratio: 2.1 (ref 1.2–2.2)
Albumin: 4.1 g/dL (ref 3.8–4.8)
Alkaline Phosphatase: 96 IU/L (ref 44–121)
BUN/Creatinine Ratio: 14 (ref 12–28)
BUN: 12 mg/dL (ref 8–27)
Bilirubin Total: 0.4 mg/dL (ref 0.0–1.2)
CO2: 27 mmol/L (ref 20–29)
Calcium: 9.7 mg/dL (ref 8.7–10.3)
Chloride: 104 mmol/L (ref 96–106)
Creatinine, Ser: 0.83 mg/dL (ref 0.57–1.00)
GFR calc Af Amer: 83 mL/min/{1.73_m2} (ref >59)
GFR calc non Af Amer: 72 mL/min/{1.73_m2} (ref >59)
Globulin, Total: 2 g/dL (ref 1.5–4.5)
Glucose: 96 mg/dL (ref 65–99)
Potassium: 4.3 mmol/L (ref 3.5–5.2)
Sodium: 144 mmol/L (ref 134–144)
Total Protein: 6.1 g/dL (ref 6.0–8.5)

## 2020-06-18 LAB — VITAMIN D 25 HYDROXY (VIT D DEFICIENCY, FRACTURES): Vit D, 25-Hydroxy: 30.5 ng/mL (ref 30.0–100.0)

## 2020-06-18 LAB — ANEMIA PROFILE B
Basophils Absolute: 0 10*3/uL (ref 0.0–0.2)
Basos: 0 %
EOS (ABSOLUTE): 0.2 10*3/uL (ref 0.0–0.4)
Eos: 3 %
Ferritin: 95 ng/mL (ref 15–150)
Folate: 19.8 ng/mL (ref >3.0)
Hematocrit: 43.1 % (ref 34.0–46.6)
Hemoglobin: 14.2 g/dL (ref 11.1–15.9)
Immature Grans (Abs): 0 10*3/uL (ref 0.0–0.1)
Immature Granulocytes: 0 %
Iron Saturation: 19 % (ref 15–55)
Iron: 54 ug/dL (ref 27–139)
Lymphocytes Absolute: 1.3 10*3/uL (ref 0.7–3.1)
Lymphs: 19 %
MCH: 29.5 pg (ref 26.6–33.0)
MCHC: 32.9 g/dL (ref 31.5–35.7)
MCV: 89 fL (ref 79–97)
Monocytes Absolute: 0.5 10*3/uL (ref 0.1–0.9)
Monocytes: 7 %
Neutrophils Absolute: 4.7 10*3/uL (ref 1.4–7.0)
Neutrophils: 71 %
Platelets: 205 10*3/uL (ref 150–450)
RBC: 4.82 x10E6/uL (ref 3.77–5.28)
RDW: 13.8 % (ref 11.7–15.4)
Retic Ct Pct: 1.6 % (ref 0.6–2.6)
Total Iron Binding Capacity: 291 ug/dL (ref 250–450)
UIBC: 237 ug/dL (ref 118–369)
Vitamin B-12: 463 pg/mL (ref 232–1245)
WBC: 6.7 10*3/uL (ref 3.4–10.8)

## 2020-06-18 LAB — THYROID PANEL WITH TSH
Free Thyroxine Index: 1.7 (ref 1.2–4.9)
T3 Uptake Ratio: 26 % (ref 24–39)
T4, Total: 6.4 ug/dL (ref 4.5–12.0)
TSH: 2.42 u[IU]/mL (ref 0.450–4.500)

## 2020-06-18 NOTE — Progress Notes (Signed)
   Covid-19 Vaccination Clinic  Name:  KAYLYNN CHAMBLIN    MRN: [id number] DOB: Dec 05, 1950  06/18/2020  Ms. Pajak was observed post Covid-19 immunization for 15 minutes without incident. She was provided with Vaccine Information Sheet and instruction to access the V-Safe system.   Ms. Trueba was instructed to call 911 with any severe reactions post vaccine: Marland Kitchen Difficulty breathing  . Swelling of face and throat  . A fast heartbeat  . A bad rash all over body  . Dizziness and weakness   Immunizations Administered    Name Date Dose VIS Date Route   PFIZER Comrnaty(Gray TOP) Covid-19 Vaccine 06/18/2020  2:13 PM 0.3 mL 04/03/2020 Intramuscular   Manufacturer: [employer], [employer]   Lot: [id number]   NDC: [phone]

## 2020-06-19 ENCOUNTER — Telehealth: Payer: Self-pay | Admitting: Family Medicine

## 2020-06-19 NOTE — Telephone Encounter (Signed)
Pt wanted to make sure she still needs to continue taking [place] since [place] said that her latest labs were good

## 2020-06-19 NOTE — Telephone Encounter (Signed)
Patient aware and verbalizes understanding. 

## 2020-06-19 NOTE — Telephone Encounter (Signed)
Yes she should continue

## 2020-06-26 ENCOUNTER — Encounter: Payer: Self-pay | Admitting: Physical Therapy

## 2020-06-26 ENCOUNTER — Other Ambulatory Visit: Payer: Self-pay

## 2020-06-26 ENCOUNTER — Ambulatory Visit: Payer: Medicare Other | Attending: Family Medicine | Admitting: Physical Therapy

## 2020-06-26 DIAGNOSIS — R2681 Unsteadiness on feet: Secondary | ICD-10-CM | POA: Diagnosis present

## 2020-06-26 DIAGNOSIS — M6281 Muscle weakness (generalized): Secondary | ICD-10-CM | POA: Insufficient documentation

## 2020-06-26 DIAGNOSIS — R262 Difficulty in walking, not elsewhere classified: Secondary | ICD-10-CM | POA: Diagnosis present

## 2020-06-26 NOTE — Therapy (Signed)
[facility] [address] [place], [place], [address] Phone: [phone]   Fax:  [phone]  Physical Therapy Evaluation  Patient Details  Name: Allison Rollins MRN: [id number] Date of Birth: May 13, 1950 Referring Provider (PT): [name]   Encounter Date: 06/26/2020   PT End of Session - 06/26/20 2147    Visit Number 1    Number of Visits 8    Date for PT Re-Evaluation 08/28/20    Authorization Type [employer] Medicare (CQ and KX modifier) Progress note every 10th visit    PT Start Time 1433    PT Stop Time 1516    PT Time Calculation (min) 43 min    Equipment Utilized During Treatment Other (comment)   walking stick   Activity Tolerance Patient tolerated treatment well    Behavior During Therapy [facility] for tasks assessed/performed           Past Medical History:  Diagnosis Date  . Anxiety   . Asthma   . Chest pain 12/22/2015  . CHF (congestive heart failure) (HCC)   . COPD (chronic obstructive pulmonary disease) (HCC)   . Depression   . Headache   . HTN (hypertension)   . Hypokalemia 07/28/2013  . Kidney stones   . Osteopenia 01/22/2020  . Parkinson's disease   . Pneumonia   . Pre-diabetes   . S/P colonoscopy 10/15/04   normal    Past Surgical History:  Procedure Laterality Date  . CHOLECYSTECTOMY    . COLONOSCOPY  10/15/2004   Normal rectum/Normal colon  . COLONOSCOPY N/A 05/10/2016   Procedure: COLONOSCOPY;  Surgeon: West Bali, MD;  [facility];  Service: Endoscopy;  Laterality: N/A;  10:30  . ESOPHAGEAL MANOMETRY N/A 04/02/2019   Procedure: ESOPHAGEAL MANOMETRY (EM);  Surgeon: Napoleon Form, MD;  [facility];  Service: Endoscopy;  Laterality: N/A;  . ESOPHAGOGASTRODUODENOSCOPY  09/11/2010   Dysphagia likely multifactorial (possible Candida esophagitis, likely nonspecific esophageal motility disorder, and/or uncontrolled gastroesophageal reflux disease), status post empiric  dilation  . ESOPHAGOGASTRODUODENOSCOPY N/A 11/13/2018   Dr. Darrick Penna: No endoscopic esophageal abnormality to explain patient's dysphagia.  Esophagus dilated.  Moderate erosive/nodular gastritis with benign biopsies.  Marland Kitchen KIDNEY STONE SURGERY    . SAVORY DILATION N/A 11/13/2018   Procedure: SAVORY DILATION;  Surgeon: [place] Bali, MD;  [facility];  Service: Endoscopy;  Laterality: N/A;    There were no vitals filed for this visit.    Subjective Assessment - 06/26/20 2117    Subjective COVID-19 screening performed upon arrival. Patient arrives physical therapy with weakness and difficulty walking and back pain that began about four years ago. Patient reports ability to perform all ADLs and home activities independently but reports requiring increased time to perform them. Patient has the most difficulties with mopping and vacuuming. Patient ambulates with a walker at home and a walking stick out in the community. Patient lives alone but has her son check up on her daily. Patient reports limitation in balance and a fear of falling. Patient goals are to decrease pain improve movement improve strength and have less difficulty with performing home activities and ADLs.    Pertinent History Neuropathy, Osteopenia, Stage 3 chronic kidney disease, DM, CHF, COPD, Depression, Parkinson's Disease    Limitations Walking;Standing;House hold activities    Patient Stated Goals improve moving around home better    Currently in Pain? Yes    Pain Score 4     Pain Location Back  Pain Orientation Lower    Pain Descriptors / Indicators Discomfort    Pain Type Chronic pain    Pain Onset More than a month ago    Pain Frequency Constant              [facility] PT Assessment - 06/26/20 0001      Assessment   Medical Diagnosis Physical deconditioning    Referring Provider (PT) [name]    Onset Date/Surgical Date --   ongoing for 4 years   Next MD Visit "3 months"    Prior Therapy no       Precautions   Precautions Fall      Restrictions   Weight Bearing Restrictions No      Balance Screen   Has the patient fallen in the past 6 months No    Has the patient had a decrease in activity level because of a fear of falling?  Yes    Is the patient reluctant to leave their home because of a fear of falling?  Yes      Home Environment   Living Environment Private residence    Living Arrangements Alone    Type of Home House    Home Access Ramped entrance    Home Equipment [place] - 2 wheels;Bedside commode;Shower seat;Grab bars - tub/shower      Prior Function   Level of Independence Independent with basic ADLs      Posture/Postural Control   Posture/Postural Control Postural limitations    Postural Limitations Rounded Shoulders;Forward head;Increased thoracic kyphosis;Flexed trunk      ROM / Strength   AROM / PROM / Strength Strength      Strength   Overall Strength Comments hip abduction and adduction performed in sitting    Strength Assessment Site Hip;Knee    Right/Left Hip Right;Left    Right Hip Flexion 3+/5    Right Hip ABduction 3-/5    Right Hip ADduction 3-/5    Left Hip Flexion 3+/5    Left Hip ABduction 3-/5    Left Hip ADduction 3-/5    Right/Left Knee Right;Left    Right Knee Flexion 4-/5    Right Knee Extension 4/5    Left Knee Flexion 4-/5    Left Knee Extension 4/5      Transfers   Comments UE support for sit to stand transfers, requires min A for supine to sit transfer      Ambulation/Gait   Assistive device Other (Comment)   walking stick "the campbell cane"   Gait Pattern Step-to pattern;Decreased step length - right;Decreased stance time - left;Decreased stride length;Decreased hip/knee flexion - left;Decreased weight shift to left;Antalgic;Lateral hip instability;Lateral trunk lean to right;Trunk flexed;Trunk rotated posteriorly on left;Wide base of support;Poor foot clearance - right    Gait velocity decreased gait speed      Standardized  Balance Assessment   Standardized Balance Assessment Five Times Sit to Stand    Five times sit to stand comments  31.27 with UE support                      Objective measurements completed on examination: See above findings.               PT Education - 06/26/20 2146    Education Details heel toe raises, hip abduction pillow squeeze, walking in home    Person(s) Educated Patient    Methods Explanation;Demonstration;Handout    Comprehension Verbalized understanding  PT Long Term Goals - 06/26/20 2150      PT LONG TERM GOAL #1   Title Patient will be independent with HEP and its progression.    Time 8    Period Weeks    Status New      PT LONG TERM GOAL #2   Title Patient will demonstrate 4/5  bilateral LE MMT to improve stability during functional tasks.    Time 8    Period Weeks    Status New      PT LONG TERM GOAL #3   Title Patient will perform 5x sit to stand test in 25 seconds or less with UE support to improve balance and improve functional LE strength.    Time 8    Period Weeks    Status New      PT LONG TERM GOAL #4   Title Patient will stand unsupported with wide base of support for 2 minutes independently to improve static balance for home tasks.    Time 8    Period Weeks    Status New                  Plan - 06/26/20 2148    Clinical Impression Statement Patient is a 70 year old female who presents the physical therapy with bilateral lower extremity weakness, decreased balance, and difficulty walking that has progressed over the last four years. Patient's modified 5x sit to stand time with upper extremity support time categorizes her as a fall risk with decreased functional lower extremity strength. Patient requires upper extremity support for transfers and min assist for bed mobility. Patient ambulates with a walking stick with decreased right step length decreased left stance time increased right trunk lean  and decreased gate velocity. Patient noted with postural deficits in both sitting and standing. Patient and PT discussed plan of care and discussed home program to which the patient reported understanding. Patient would benefit from skilled physical therapy to address deficits and patient goals.    Personal Factors and Comorbidities Age;Time since onset of injury/illness/exacerbation    Comorbidities Neuropathy, Osteopenia, Stage 3 chronic kidney disease, DM, CHF, COPD, Depression, Parkinson's Disease    Examination-Activity Limitations Bed Mobility;Locomotion Level;Transfers;Stand    Examination-Participation Restrictions Cleaning    Stability/Clinical Decision Making Stable/Uncomplicated    Clinical Decision Making Low    Rehab Potential Fair    PT Frequency 1x / week    PT Duration 8 weeks    PT Treatment/Interventions ADLs/Self Care Home Management;Gait training;Stair training;Functional mobility training;Therapeutic activities;Therapeutic exercise;Balance training;Neuromuscular re-education;Manual techniques;Passive range of motion;Patient/family education    PT Next Visit Plan nustep, LE strengthening and stretching, core stability, balance activities in sitting and standing    Consulted and Agree with Plan of Care Patient           Patient will benefit from skilled therapeutic intervention in order to improve the following deficits and impairments:  Decreased balance,Decreased activity tolerance,Difficulty walking,Pain,Decreased strength,Postural dysfunction  Visit Diagnosis: Muscle weakness (generalized)  Difficulty in walking, not elsewhere classified  Unsteadiness on feet     Problem List Patient Active Problem List   Diagnosis Date Noted  . Stage 3a chronic kidney disease (HCC) 06/17/2020  . Osteopenia 01/22/2020  . Neuropathy 10/21/2019  . 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 Achalasia of esophagus   . Vitamin D deficiency 11/24/2018  . Restless leg 11/24/2018  .  Erosive gastritis   . Seasonal allergic rhinitis due to pollen 07/20/2018  . Recurrent depression (HCC)  12/26/2015  . Generalized anxiety disorder 03/12/2015  . Prediabetes 03/12/2015  . Back pain 03/12/2015  . Stress incontinence 01/06/2015  . Chronic diastolic heart failure (HCC) 01/03/2015  . Obesity (BMI 30-39.9) 07/01/2011  . Mixed hyperlipidemia 07/01/2011  . GERD (gastroesophageal reflux disease) 09/08/2010    Guss Bunde, PT, DPT 06/26/2020, 9:53 PM  [facility] [address] [place], [place], [address] Phone: [phone]   Fax:  [phone]  Name: Allison Rollins MRN: [id number] Date of Birth: 09-22-1950

## 2020-07-03 ENCOUNTER — Ambulatory Visit: Payer: Medicare Other | Admitting: *Deleted

## 2020-07-03 ENCOUNTER — Encounter: Payer: Self-pay | Admitting: *Deleted

## 2020-07-03 ENCOUNTER — Other Ambulatory Visit: Payer: Self-pay

## 2020-07-03 DIAGNOSIS — R262 Difficulty in walking, not elsewhere classified: Secondary | ICD-10-CM

## 2020-07-03 DIAGNOSIS — M6281 Muscle weakness (generalized): Secondary | ICD-10-CM | POA: Diagnosis not present

## 2020-07-03 DIAGNOSIS — R2681 Unsteadiness on feet: Secondary | ICD-10-CM

## 2020-07-03 NOTE — Therapy (Signed)
[facility] [address] [place], [place], [address] Phone: [phone]   Fax:  [phone]  Physical Therapy Treatment  Patient Details  Name: Allison Rollins MRN: [id number] Date of Birth: 02-Aug-1950 Referring Provider (PT): [name]   Encounter Date: 07/03/2020   PT End of Session - 07/03/20 1124    Visit Number 2    Number of Visits 8    Date for PT Re-Evaluation 08/28/20    Authorization Type [employer] Medicare (CQ and KX modifier) Progress note every 10th visit    PT Start Time 1115    PT Stop Time 1205    PT Time Calculation (min) 50 min    Equipment Utilized During Treatment Other (comment)    Activity Tolerance Patient tolerated treatment well    Behavior During Therapy [facility] for tasks assessed/performed           Past Medical History:  Diagnosis Date  . Anxiety   . Asthma   . Chest pain 12/22/2015  . CHF (congestive heart failure) (HCC)   . COPD (chronic obstructive pulmonary disease) (HCC)   . Depression   . Headache   . HTN (hypertension)   . Hypokalemia 07/28/2013  . Kidney stones   . Osteopenia 01/22/2020  . Parkinson's disease   . Pneumonia   . Pre-diabetes   . S/P colonoscopy 10/15/04   normal    Past Surgical History:  Procedure Laterality Date  . CHOLECYSTECTOMY    . COLONOSCOPY  10/15/2004   Normal rectum/Normal colon  . COLONOSCOPY N/A 05/10/2016   Procedure: COLONOSCOPY;  Surgeon: West Bali, MD;  [facility];  Service: Endoscopy;  Laterality: N/A;  10:30  . ESOPHAGEAL MANOMETRY N/A 04/02/2019   Procedure: ESOPHAGEAL MANOMETRY (EM);  Surgeon: Napoleon Form, MD;  [facility]: [facility];  Service: Endoscopy;  Laterality: N/A;  . ESOPHAGOGASTRODUODENOSCOPY  09/11/2010   Dysphagia likely multifactorial (possible Candida esophagitis, likely nonspecific esophageal motility disorder, and/or uncontrolled gastroesophageal reflux disease), status post empiric dilation  .  ESOPHAGOGASTRODUODENOSCOPY N/A 11/13/2018   Dr. Darrick Penna: No endoscopic esophageal abnormality to explain patient's dysphagia.  Esophagus dilated.  Moderate erosive/nodular gastritis with benign biopsies.  Marland Kitchen KIDNEY STONE SURGERY    . SAVORY DILATION N/A 11/13/2018   Procedure: SAVORY DILATION;  Surgeon: West Bali, MD;  [facility];  Service: Endoscopy;  Laterality: N/A;    There were no vitals filed for this visit.   Subjective Assessment - 07/03/20 1123    Subjective COVID-19 screening performed upon arrival. Doing ok today    Pertinent History Neuropathy, Osteopenia, Stage 3 chronic kidney disease, DM, CHF, COPD, Depression, Parkinson's Disease    Limitations Walking;Standing;House hold activities    Patient Stated Goals improve moving around home better    Currently in Pain? Yes    Pain Location Back    Pain Orientation Lower    Pain Descriptors / Indicators Discomfort    Pain Type Chronic pain    Pain Onset More than a month ago                             [facility] Adult PT Treatment/Exercise - 07/03/20 0001      Exercises   Exercises Lumbar;Knee/Hip      Lumbar Exercises: Aerobic   Nustep L1 seat 8 x 15 mins      Lumbar Exercises: Standing   Heel Raises 20 reps    Heel Raises  Limitations toe raises x 20      Lumbar Exercises: Seated   Long Arc Quad on Chair Strengthening;Both;2 sets;20 reps   cues to slow down   LAQ on Chair Weights (lbs) 2    Sit to Stand 20 reps    Other Seated Lumbar Exercises seated yellow tband scapular squeeze 3x 10               Balance Exercises - 07/03/20 0001      Balance Exercises: Standing   Standing Eyes Closed Foam/compliant surface    Rockerboard Anterior/posterior                  PT Long Term Goals - 06/26/20 2150      PT LONG TERM GOAL #1   Title Patient will be independent with HEP and its progression.    Time 8    Period Weeks    Status New      PT LONG TERM GOAL #2   Title  Patient will demonstrate 4/5  bilateral LE MMT to improve stability during functional tasks.    Time 8    Period Weeks    Status New      PT LONG TERM GOAL #3   Title Patient will perform 5x sit to stand test in 25 seconds or less with UE support to improve balance and improve functional LE strength.    Time 8    Period Weeks    Status New      PT LONG TERM GOAL #4   Title Patient will stand unsupported with wide base of support for 2 minutes independently to improve static balance for home tasks.    Time 8    Period Weeks    Status New                 Plan - 07/03/20 1125    Clinical Impression Statement Pt arrived today in good spirits and was able to perform sitting as well as standing strengthening exs as well as standing balance Act's. Very challenged  with eyes closed balance act's, but tolerated well.    Personal Factors and Comorbidities Age;Time since onset of injury/illness/exacerbation    Comorbidities Neuropathy, Osteopenia, Stage 3 chronic kidney disease, DM, CHF, COPD, Depression, Parkinson's Disease    Stability/Clinical Decision Making Stable/Uncomplicated    Rehab Potential Fair    PT Frequency 1x / week    PT Duration 8 weeks    PT Treatment/Interventions ADLs/Self Care Home Management;Gait training;Stair training;Functional mobility training;Therapeutic activities;Therapeutic exercise;Balance training;Neuromuscular re-education;Manual techniques;Passive range of motion;Patient/family education    PT Next Visit Plan nustep, LE strengthening and stretching, core stability, balance activities in sitting and standing    Pt asked about Rx for LB. Talk to Pt about getting new order for LB.           Patient will benefit from skilled therapeutic intervention in order to improve the following deficits and impairments:  Decreased balance,Decreased activity tolerance,Difficulty walking,Pain,Decreased strength,Postural dysfunction  Visit Diagnosis: Muscle weakness  (generalized)  Difficulty in walking, not elsewhere classified  Unsteadiness on feet     Problem List Patient Active Problem List   Diagnosis Date Noted  . Stage 3a chronic kidney disease (HCC) 06/17/2020  . Osteopenia 01/22/2020  . Neuropathy 10/21/2019  . 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 Achalasia of esophagus   . Vitamin D deficiency 11/24/2018  . Restless leg 11/24/2018  . Erosive gastritis   . Seasonal allergic rhinitis due to pollen 07/20/2018  .  Recurrent depression (HCC) 12/26/2015  . Generalized anxiety disorder 03/12/2015  . Prediabetes 03/12/2015  . Back pain 03/12/2015  . Stress incontinence 01/06/2015  . Chronic diastolic heart failure (HCC) 01/03/2015  . Obesity (BMI 30-39.9) 07/01/2011  . Mixed hyperlipidemia 07/01/2011  . GERD (gastroesophageal reflux disease) 09/08/2010    Janis Cuffe,CHRIS, PTA 07/03/2020, 1:55 PM  [facility] [address] [place], [place], [address] Phone: [phone]   Fax:  [phone]  Name: Allison Rollins MRN: [id number] Date of Birth: [birth year]

## 2020-07-07 ENCOUNTER — Encounter: Payer: Medicare Other | Admitting: Physical Therapy

## 2020-07-09 ENCOUNTER — Other Ambulatory Visit: Payer: Self-pay

## 2020-07-09 ENCOUNTER — Ambulatory Visit: Payer: Medicare Other | Admitting: *Deleted

## 2020-07-09 DIAGNOSIS — R2681 Unsteadiness on feet: Secondary | ICD-10-CM

## 2020-07-09 DIAGNOSIS — M6281 Muscle weakness (generalized): Secondary | ICD-10-CM | POA: Diagnosis not present

## 2020-07-09 DIAGNOSIS — R262 Difficulty in walking, not elsewhere classified: Secondary | ICD-10-CM

## 2020-07-09 NOTE — Therapy (Signed)
[facility] [address] [place], [place], [address] Phone: [phone]   Fax:  [phone]  Physical Therapy Treatment  Patient Details  Name: Allison Rollins MRN: [id number] Date of Birth: [birth year] Referring Provider (PT): [name]   Encounter Date: 07/09/2020   PT End of Session - 07/09/20 1149    Visit Number 3    Number of Visits 8    Date for PT Re-Evaluation 08/28/20    Authorization Type [employer] Medicare (CQ and KX modifier) Progress note every 10th visit    PT Start Time 1116    PT Stop Time 1205    PT Time Calculation (min) 49 min           Past Medical History:  Diagnosis Date  . Anxiety   . Asthma   . Chest pain 12/22/2015  . CHF (congestive heart failure) (HCC)   . COPD (chronic obstructive pulmonary disease) (HCC)   . Depression   . Headache   . HTN (hypertension)   . Hypokalemia 07/28/2013  . Kidney stones   . Osteopenia 01/22/2020  . Parkinson's disease   . Pneumonia   . Pre-diabetes   . S/P colonoscopy 10/15/04   normal    Past Surgical History:  Procedure Laterality Date  . CHOLECYSTECTOMY    . COLONOSCOPY  10/15/2004   Normal rectum/Normal colon  . COLONOSCOPY N/A 05/10/2016   Procedure: COLONOSCOPY;  Surgeon: West Bali, MD;  [facility];  Service: Endoscopy;  Laterality: N/A;  10:30  . ESOPHAGEAL MANOMETRY N/A 04/02/2019   Procedure: ESOPHAGEAL MANOMETRY (EM);  Surgeon: Napoleon Form, MD;  [facility];  Service: Endoscopy;  Laterality: N/A;  . ESOPHAGOGASTRODUODENOSCOPY  09/11/2010   Dysphagia likely multifactorial (possible Candida esophagitis, likely nonspecific esophageal motility disorder, and/or uncontrolled gastroesophageal reflux disease), status post empiric dilation  . ESOPHAGOGASTRODUODENOSCOPY N/A 11/13/2018   Dr. Darrick Penna: No endoscopic esophageal abnormality to explain patient's dysphagia.  Esophagus dilated.  Moderate erosive/nodular  gastritis with benign biopsies.  Marland Kitchen KIDNEY STONE SURGERY    . SAVORY DILATION N/A 11/13/2018   Procedure: SAVORY DILATION;  Surgeon: West Bali, MD;  [facility];  Service: Endoscopy;  Laterality: N/A;    There were no vitals filed for this visit.   Subjective Assessment - 07/09/20 1121    Subjective COVID-19 screening performed upon arrival. Doing ok today    Pertinent History Neuropathy, Osteopenia, Stage 3 chronic kidney disease, DM, CHF, COPD, Depression, Parkinson's Disease    Patient Stated Goals improve moving around home better    Currently in Pain? Yes    Pain Score 3     Pain Location Back    Pain Orientation Lower    Pain Descriptors / Indicators Discomfort    Pain Type Chronic pain    Pain Onset More than a month ago                             [facility] Adult PT Treatment/Exercise - 07/09/20 0001      Exercises   Exercises Lumbar;Knee/Hip      Lumbar Exercises: Aerobic   Nustep L2 seat 8 x 15 mins      Lumbar Exercises: Standing   Heel Raises 20 reps    Heel Raises Limitations toe raises x 20      Lumbar Exercises: Seated   Long Arc Quad on Chair Strengthening;Both;2 sets;20 reps;10 reps   3# x  10  both   LAQ on Chair Weights (lbs) 2    Sit to Stand 20 reps    Other Seated Lumbar Exercises seated yellow tband scapular squeeze 3x 10      Knee/Hip Exercises: Seated   Hamstring Curl Strengthening;Both;1 set;15 reps               Balance Exercises - 07/09/20 0001      Balance Exercises: Standing   Standing Eyes Closed Foam/compliant surface    Tandem Stance Eyes open;5 reps;15 secs    Rockerboard Anterior/posterior   2 mins CGA                 PT Long Term Goals - 06/26/20 2150      PT LONG TERM GOAL #1   Title Patient will be independent with HEP and its progression.    Time 8    Period Weeks    Status New      PT LONG TERM GOAL #2   Title Patient will demonstrate 4/5  bilateral LE MMT to improve  stability during functional tasks.    Time 8    Period Weeks    Status New      PT LONG TERM GOAL #3   Title Patient will perform 5x sit to stand test in 25 seconds or less with UE support to improve balance and improve functional LE strength.    Time 8    Period Weeks    Status New      PT LONG TERM GOAL #4   Title Patient will stand unsupported with wide base of support for 2 minutes independently to improve static balance for home tasks.    Time 8    Period Weeks    Status New                 Plan - 07/09/20 1121    Clinical Impression Statement Pt reports that she  thinks she is walking a little better.with her cane. Rx focused on LE strengthening as well as balance act.'s. She did better with eyes closed exs today    Personal Factors and Comorbidities Age;Time since onset of injury/illness/exacerbation    Comorbidities Neuropathy, Osteopenia, Stage 3 chronic kidney disease, DM, CHF, COPD, Depression, Parkinson's Disease    Stability/Clinical Decision Making Stable/Uncomplicated    Rehab Potential Fair    PT Frequency 1x / week    PT Duration 8 weeks    PT Treatment/Interventions ADLs/Self Care Home Management;Gait training;Stair training;Functional mobility training;Therapeutic activities;Therapeutic exercise;Balance training;Neuromuscular re-education;Manual techniques;Passive range of motion;Patient/family education    PT Next Visit Plan nustep, LE strengthening and stretching, core stability, balance activities in sitting and standing    Pt asked about Rx for LB. Talk to Pt about getting new order for LB.    Consulted and Agree with Plan of Care Patient           Patient will benefit from skilled therapeutic intervention in order to improve the following deficits and impairments:  Decreased balance,Decreased activity tolerance,Difficulty walking,Pain,Decreased strength,Postural dysfunction  Visit Diagnosis: Muscle weakness (generalized)  Difficulty in walking,  not elsewhere classified  Unsteadiness on feet     Problem List Patient Active Problem List   Diagnosis Date Noted  . Stage 3a chronic kidney disease (HCC) 06/17/2020  . Osteopenia 01/22/2020  . Neuropathy 10/21/2019  . 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 Achalasia of esophagus   . Vitamin D deficiency 11/24/2018  . Restless leg 11/24/2018  . Erosive gastritis   .  Seasonal allergic rhinitis due to pollen 07/20/2018  . Recurrent depression (HCC) 12/26/2015  . Generalized anxiety disorder 03/12/2015  . Prediabetes 03/12/2015  . Back pain 03/12/2015  . Stress incontinence 01/06/2015  . Chronic diastolic heart failure (HCC) 01/03/2015  . Obesity (BMI 30-39.9) 07/01/2011  . Mixed hyperlipidemia 07/01/2011  . GERD (gastroesophageal reflux disease) 09/08/2010    Neddie Steedman,CHRIS, PTA 07/09/2020, 1:12 PM  [facility] [address] [place], [place], [address] Phone: [phone]   Fax:  [phone]  Name: Allison Rollins MRN: [id number] Date of Birth: Jan 31, 1951

## 2020-07-15 ENCOUNTER — Other Ambulatory Visit: Payer: Self-pay

## 2020-07-15 ENCOUNTER — Ambulatory Visit: Payer: Medicare Other | Admitting: *Deleted

## 2020-07-15 DIAGNOSIS — R2681 Unsteadiness on feet: Secondary | ICD-10-CM

## 2020-07-15 DIAGNOSIS — M6281 Muscle weakness (generalized): Secondary | ICD-10-CM | POA: Diagnosis not present

## 2020-07-15 DIAGNOSIS — R262 Difficulty in walking, not elsewhere classified: Secondary | ICD-10-CM

## 2020-07-15 NOTE — Therapy (Signed)
[facility] [address] [place], [place], [address] Phone: [phone]   Fax:  [phone]  Physical Therapy Treatment  Patient Details  Name: Allison Rollins MRN: [id number] Date of Birth: 1950/07/04 Referring Provider (PT): [name]   Encounter Date: 07/15/2020   PT End of Session - 07/15/20 1309    Visit Number 4    Number of Visits 8    Date for PT Re-Evaluation 08/28/20    Authorization Type [employer] Medicare (CQ and KX modifier) Progress note every 10th visit    PT Start Time 1300    PT Stop Time 1350    PT Time Calculation (min) 50 min           Past Medical History:  Diagnosis Date  . Anxiety   . Asthma   . Chest pain 12/22/2015  . CHF (congestive heart failure) (HCC)   . COPD (chronic obstructive pulmonary disease) (HCC)   . Depression   . Headache   . HTN (hypertension)   . Hypokalemia 07/28/2013  . Kidney stones   . Osteopenia 01/22/2020  . Parkinson's disease   . Pneumonia   . Pre-diabetes   . S/P colonoscopy 10/15/04   normal    Past Surgical History:  Procedure Laterality Date  . CHOLECYSTECTOMY    . COLONOSCOPY  10/15/2004   Normal rectum/Normal colon  . COLONOSCOPY N/A 05/10/2016   Procedure: COLONOSCOPY;  Surgeon: West Bali, MD;  [facility];  Service: Endoscopy;  Laterality: N/A;  10:30  . ESOPHAGEAL MANOMETRY N/A 04/02/2019   Procedure: ESOPHAGEAL MANOMETRY (EM);  Surgeon: Napoleon Form, MD;  [facility];  Service: Endoscopy;  Laterality: N/A;  . ESOPHAGOGASTRODUODENOSCOPY  09/11/2010   Dysphagia likely multifactorial (possible Candida esophagitis, likely nonspecific esophageal motility disorder, and/or uncontrolled gastroesophageal reflux disease), status post empiric dilation  . ESOPHAGOGASTRODUODENOSCOPY N/A 11/13/2018   Dr. Darrick Penna: No endoscopic esophageal abnormality to explain patient's dysphagia.  Esophagus dilated.  Moderate erosive/nodular  gastritis with benign biopsies.  Marland Kitchen KIDNEY STONE SURGERY    . SAVORY DILATION N/A 11/13/2018   Procedure: SAVORY DILATION;  Surgeon: West Bali, MD;  [facility];  Service: Endoscopy;  Laterality: N/A;    There were no vitals filed for this visit.   Subjective Assessment - 07/15/20 1306    Subjective COVID-19 screening performed upon arrival. Doing ok today. Did good after last visit    Pertinent History Neuropathy, Osteopenia, Stage 3 chronic kidney disease, DM, CHF, COPD, Depression, Parkinson's Disease    Limitations Walking;Standing;House hold activities    Patient Stated Goals improve moving around home better    Currently in Pain? Yes    Pain Score 3     Pain Location Back    Pain Orientation Lower    Pain Descriptors / Indicators Discomfort    Pain Type Chronic pain    Pain Onset More than a month ago    Pain Frequency Constant                             [facility] Adult PT Treatment/Exercise - 07/15/20 0001      Exercises   Exercises Lumbar;Knee/Hip      Lumbar Exercises: Aerobic   Nustep L3 seat 8 x 15 mins      Lumbar Exercises: Standing   Heel Raises 20 reps    Heel Raises Limitations toe raises x 20  Lumbar Exercises: Seated   Long Arc Quad on Chair Strengthening;Both;2 sets;20 reps   1x20 each wt Bil LEs   LAQ on Chair Weights (lbs) 3,4    Sit to Stand 20 reps   with UE assist on mat table   Other Seated Lumbar Exercises seated yellow tband scapular squeeze 3x 10      Knee/Hip Exercises: Seated   Hamstring Curl Strengthening;Both;1 set;15 reps    Hamstring Limitations yellow               Balance Exercises - 07/15/20 0001      Balance Exercises: Standing   Standing Eyes Closed Foam/compliant surface    Tandem Stance Eyes open;5 reps;15 secs    Rockerboard Anterior/posterior                  PT Long Term Goals - 06/26/20 2150      PT LONG TERM GOAL #1   Title Patient will be independent with HEP and  its progression.    Time 8    Period Weeks    Status New      PT LONG TERM GOAL #2   Title Patient will demonstrate 4/5  bilateral LE MMT to improve stability during functional tasks.    Time 8    Period Weeks    Status New      PT LONG TERM GOAL #3   Title Patient will perform 5x sit to stand test in 25 seconds or less with UE support to improve balance and improve functional LE strength.    Time 8    Period Weeks    Status New      PT LONG TERM GOAL #4   Title Patient will stand unsupported with wide base of support for 2 minutes independently to improve static balance for home tasks.    Time 8    Period Weeks    Status New                 Plan - 07/15/20 1352    Clinical Impression Statement Pt arrived today in good spirits and feels that she is doing better overall  with walking and balance. She was able to progress in some of the exs today and did well. Increased stability with tandem stance today.    Comorbidities Neuropathy, Osteopenia, Stage 3 chronic kidney disease, DM, CHF, COPD, Depression, Parkinson's Disease    Examination-Activity Limitations Bed Mobility;Locomotion Level;Transfers;Stand    Stability/Clinical Decision Making Stable/Uncomplicated    Rehab Potential Fair    PT Frequency 1x / week    PT Duration 8 weeks    PT Treatment/Interventions ADLs/Self Care Home Management;Gait training;Stair training;Functional mobility training;Therapeutic activities;Therapeutic exercise;Balance training;Neuromuscular re-education;Manual techniques;Passive range of motion;Patient/family education    PT Next Visit Plan nustep, LE strengthening and stretching, core stability, balance activities in sitting and standing    Pt asked about Rx for LB. Talk to Pt about getting new order for LB.    Consulted and Agree with Plan of Care Patient           Patient will benefit from skilled therapeutic intervention in order to improve the following deficits and impairments:   Decreased balance,Decreased activity tolerance,Difficulty walking,Pain,Decreased strength,Postural dysfunction  Visit Diagnosis: Muscle weakness (generalized)  Difficulty in walking, not elsewhere classified  Unsteadiness on feet     Problem List Patient Active Problem List   Diagnosis Date Noted  . Stage 3a chronic kidney disease (HCC) 06/17/2020  . Osteopenia 01/22/2020  .  Neuropathy 10/21/2019  . 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 Achalasia of esophagus   . Vitamin D deficiency 11/24/2018  . Restless leg 11/24/2018  . Erosive gastritis   . Seasonal allergic rhinitis due to pollen 07/20/2018  . Recurrent depression (HCC) 12/26/2015  . Generalized anxiety disorder 03/12/2015  . Prediabetes 03/12/2015  . Back pain 03/12/2015  . Stress incontinence 01/06/2015  . Chronic diastolic heart failure (HCC) 01/03/2015  . Obesity (BMI 30-39.9) 07/01/2011  . Mixed hyperlipidemia 07/01/2011  . GERD (gastroesophageal reflux disease) 09/08/2010    Melisa Donofrio,CHRIS, PTA 07/15/2020, 1:59 PM  [facility] [address] [place], [place], [address] Phone: [phone]   Fax:  [phone]  Name: NAJE RICE MRN: [id number] Date of Birth: 1950/11/09

## 2020-07-16 ENCOUNTER — Ambulatory Visit: Payer: Medicare Other | Admitting: Physical Therapy

## 2020-07-23 ENCOUNTER — Ambulatory Visit: Payer: Medicare Other | Admitting: Physical Therapy

## 2020-07-24 ENCOUNTER — Ambulatory Visit: Payer: Medicare Other | Admitting: Physical Therapy

## 2020-07-25 ENCOUNTER — Ambulatory Visit (INDEPENDENT_AMBULATORY_CARE_PROVIDER_SITE_OTHER): Payer: Medicare Other | Admitting: *Deleted

## 2020-07-25 ENCOUNTER — Telehealth: Payer: Self-pay | Admitting: *Deleted

## 2020-07-25 DIAGNOSIS — R5381 Other malaise: Secondary | ICD-10-CM

## 2020-07-25 DIAGNOSIS — R7303 Prediabetes: Secondary | ICD-10-CM

## 2020-07-25 DIAGNOSIS — E782 Mixed hyperlipidemia: Secondary | ICD-10-CM | POA: Diagnosis not present

## 2020-07-25 DIAGNOSIS — I10 Essential (primary) hypertension: Secondary | ICD-10-CM | POA: Diagnosis not present

## 2020-07-25 NOTE — Patient Instructions (Signed)
Visit Information  PATIENT GOALS:  Goals Addressed            This Visit's Progress   . Manage My Cholesterol       Timeframe:  Long-Range Goal Priority:  Medium Start Date:    07/25/20                         Expected End Date:  08/24/21                     Follow Up Date 09/12/20   . Continue physical therapy to improve strength and balance . Increase physical activity level as tolerated with a goal of at least 150 minutes a week . Continue to eat a heart healthy diet and avoid fried/fatty foods . Avoid grapefruit juice and grapfruits . Schedule appointment with PCP for the end of May 2022 for folllow-up . Call RN Care Manager as needed [phone]   Why is this important?   Changing cholesterol starts with eating heart-healthy foods.  Other steps may be to increase your activity and to quit if you smoke.    Notes:     . Prevent Diabetes       Timeframe:  Long-Range Goal Priority:  Medium Start Date:  07/25/20                           Expected End Date:  07/25/21                     Follow-up: 09/12/20  . Take Wilder Glade as prescribed . Work with [facility] if new prescription assistance application is needed for [place] . Limit sugar and simple carbohydrates in diet . Reach out to [facility] clinical staff if lancets and test strips are not filled at preferred pharmacy . Schedule appt with PCP for May 2022 . Increase physical activity level with a goal of at least 150 min a week . Call RNCM as needed    . Prevent Falls       Timeframe:  Long-Range Goal Priority:  High Start Date:     07/25/20                        Expected End Date:    08/24/21                   Follow-up: 09/12/20  . Use walker for ambulation . Keep cell phone with her at all times . Move carefully and change positions slowly . Attend all physical therapy sessions . Call RN Care Manager as needed [phone]    . Track and Manage My Blood Pressure-Hypertension       Timeframe:  Long-Range Goal Priority:   Medium Start Date:   07/25/20                          Expected End Date:     07/25/21                  Follow Up Date 09/12/20   . Obtain blood pressure monitor for home use through [facility] resource book . Let RNCM know if she can't order blood pressure monitor through [facility] resource book . Check and record blood pressure at least 3 times per week . Take log to PCP and cardiology appointment . Schedule f/u with PCP  for May 2022 . Continue low sodium diet . Continue with physical therapy sessions and work on increasing physical activity level with an ultimate goal of at least 150 minutes a week    Why is this important?    You won't feel high blood pressure, but it can still hurt your blood vessels.   High blood pressure can cause heart or kidney problems. It can also cause a stroke.   Making lifestyle changes like losing a little weight or eating less salt will help.   Checking your blood pressure at home and at different times of the day can help to control blood pressure.   If the doctor prescribes medicine remember to take it the way the doctor ordered.   Call the office if you cannot afford the medicine or if there are questions about it.     Notes:        Consent to CCM Services: Ms. Housley was given information about Chronic Care Management services today including:  1. CCM service includes personalized support from designated clinical staff supervised by her physician, including individualized plan of care and coordination with other care providers 2. 24/7 contact phone numbers for assistance for urgent and routine care needs. 3. Service will only be billed when office clinical staff spend 20 minutes or more in a month to coordinate care. 4. Only one practitioner may furnish and bill the service in a calendar month. 5. The patient may stop CCM services at any time (effective at the end of the month) by phone call to the office staff. 6. The patient will be responsible for cost  sharing (co-pay) of up to 20% of the service fee (after annual deductible is met).  Patient agreed to services and verbal consent obtained.   Patient verbalizes understanding of instructions provided today and agrees to view in [place].   Follow Up Plan:  . Telephone follow up appointment with care management team member scheduled for: 09/12/20 with RNCM . The patient has been provided with contact information for the care management team and has been advised to call with any health related questions or concerns.  . Next PCP appointment pending scheduling in May 2022 . Next cardiology appointment is scheduled for 09/17/20 with Branch  Chong Sicilian, BSN, RN-BC Embedded Chronic Care Manager [facility] Care Management Direct Dial: [phone]   CLINICAL CARE PLAN: Patient Care Plan: RNCM: Wellness (Adult)    Problem Identified: Healthy Nutrition (Wellness)     Long-Range Goal: Manage My Cholesterol   Start Date: 07/25/2020  This Visit's Progress: On track  Priority: Medium  Note:   Objective: Lab Results  Component Value Date   CHOL 179 03/18/2020   HDL 53 03/18/2020   LDLCALC 103 (H) 03/18/2020   TRIG 133 03/18/2020   CHOLHDL 3.4 03/18/2020   Current Barriers:  . Chronic Disease Management support and education needs related to hyperlipidemia in a patient with HTN, CHF, and CKD  Nurse Case Manager Clinical Goal(s):  . patient will work with PCP to address needs related to medical management of hyperlipidemia . patient will meet with RN Care Manager to address self-management of hyperlipidemia . patient will demonstrate improved adherence to prescribed treatment plan for hyperlipidemia as evidenced byan LDL within provider recommended range  Interventions:  . 1:1 collaboration with Loman Brooklyn, FNP regarding development and update of comprehensive plan of care as evidenced by provider attestation and co-signature . Inter-disciplinary care  team collaboration (see longitudinal plan  of care) . Evaluation of current treatment plan related to hyperlipidemia and patient's adherence to plan as established by provider. . Chart reviewed including relevant office notes and lab results o Discussed recent lab results and provider recommendation . Reviewed and discussed mediations o Lipitor 46m daily o Fish oil o Compliant with medications . Discussed Diet o Enjoys fruits, vegetables, beans, and meat/chicken o Does not eat fried foods. Bakes or broils them.  o Drinks grapefruit juice - Educated on reaction between lipitor and grapefruit/grapefruit juice and recommended that she only have that on rare occassions . Denies myalgias  . Discussed physical activity and mobility o Patient is physically deconditioned and is having outpatient physical therapy for strength and gait training o Uses a walker for ambulation . Reviewed upcoming appointments o Due for f/u with PCP in May 2022 and that has not been scheduled o RNCM will collaborate with front office staff to schedule follow up visit . Provided with RNCM contact number and encouraged to reach out as needed  Patient Goals/Self-Care Activities Over the next 30 days, patient will: . Continue physical therapy to improve strength and balance . Increase physical activity level as tolerated with a goal of at least 150 minutes a week . Continue to eat a heart healthy diet and avoid fried/fatty foods . Avoid grapefruit juice and grapfruits . Schedule appointment with PCP for the end of May 2022 for folllow-up . Call RN Care Manager as needed [phone] Follow Up Plan:  . Telephone follow up appointment with care management team member scheduled for: 09/12/20 with RNCM . The patient has been provided with contact information for the care management team and has been advised to call with any health related questions or concerns.  . Next PCP appointment pending scheduling in May  2022 . Next cardiology appointment is scheduled for 09/17/20 with Branch       Long-Range Goal: Prevent Diabetes   Start Date: 07/25/2020  This Visit's Progress: On track  Priority: Medium  Note:   Objective: Lab Results  Component Value Date   HGBA1C 5.7 06/17/2020   HGBA1C 5.6 01/22/2020   HGBA1C 5.6 10/19/2019   Lab Results  Component Value Date   LDLCALC 103 (H) 03/18/2020   CREATININE 0.83 06/17/2020   Current Barriers:  . Chronic Disease Management support and education needs related to prediabetes in a patient with HLD, HTN, CHF, and CKD . Physical deconditioning   Nurse Case Manager Clinical Goal(s):  . patient will work with PCP to address needs related to medical management of prediabetes . patient will meet with RN Care Manager to address self-management of prediabetes . the patient will demonstrate ongoing self health care management ability as evidenced by maintaining an A1C of <6*  Interventions:  . 1:1 collaboration with JLoman Brooklyn FNP regarding development and update of comprehensive plan of care as evidenced by provider attestation and co-signature . Inter-disciplinary care team collaboration (see longitudinal plan of care) . Evaluation of current treatment plan related to prediabetes and patient's adherence to plan as established by provider. . Reviewed relevant office notes and lab results . Discussed recent A1C results . Reviewed and discussed medications o Farxiga  - receiving through prescription assistance program. May need to fill out a new application. - Compliant with medication . Reviewed and discussed diet o Drinks two regular sodas a day o Eats fruits, vegetables, beans, and meat/chicken o Avoids fried/fatty foods o Does not eat a low carb diet . Discussed home blood  sugar testing o Testing daily o Needs refill of lancets and test strips - RNCM will send request to [facility] clinical staff o Normally runs below 150 with no readings below  70 or above 200 . Reviewed upcoming appointments and recommended she schedule a f/u with PCP in May 2022 . Provided with RNCM contact number and encouraged to reach out as needed  Patient Goals/Self-Care Activities Over the next 30 days, patient will: . Take Wilder Glade as prescribed . Work with [facility] if new prescription assistance application is needed for [place] . Limit sugar and simple carbohydrates in diet . Reach out to [facility] clinical staff if lancets and test strips are not filled at preferred pharmacy . Schedule appt with PCP for May 2022 . Increase physical activity level with a goal of at least 150 min a week . Call RNCM as needed  Follow Up Plan:  . Telephone follow up appointment with care management team member scheduled for: 09/12/20 with RNCM . The patient has been provided with contact information for the care management team and has been advised to call with any health related questions or concerns.  . Next PCP appointment pending scheduling in May 2022 . Next cardiology appointment is scheduled for 09/17/20 with Branch      Problem Identified: Fall Risk     Long-Range Goal: Absence of Fall and Fall-Related Injury   Start Date: 07/25/2020  This Visit's Progress: On track  Priority: High  Note:   Current Barriers:  . Care Coordination needs related to increased fall risk in a patient with hypertension, prediabetes, CKD, CHF  . Physical deconditioning  Nurse Case Manager Clinical Goal(s):  . patient will meet with RN Care Manager to address self-management of fall prevention . the patient will demonstrate ongoing self health care management ability as evidenced by using assistive devices for ambulation and attending physical therapy sessions for strength and gait training*  Interventions:  . 1:1 collaboration with Loman Brooklyn, FNP regarding development and update of comprehensive plan of care as evidenced by provider attestation and co-signature . Inter-disciplinary  care team collaboration (see longitudinal plan of care) . Reviewed and discussed medications . Falls assessment performed o 2 falls in past year o One off of porch and one out of shower o Bruising but no major injuries . Verbal fall prevention education provided . Discussed physical deconditioning . Encouraged to continue with physical therapy sessions for strength and gait training . Reviewed and discussed assistive devices o Uses walker for ambulation  o Has a shower chair o Keeps cell phone with her at all times . Provided with RN Care Manager contact number and encouraged to reach out as needed  Patient Goals/Self-Care Activities Over the next 30 days, patient will: Marland Kitchen Use walker for ambulation . Keep cell phone with her at all times . Move carefully and change positions slowly . Attend all physical therapy sessions . Call RN Care Manager as needed [phone]  Follow Up Plan:  . Telephone follow up appointment with care management team member scheduled for: 09/12/20 with RNCM . The patient has been provided with contact information for the care management team and has been advised to call with any health related questions or concerns.  . Next PCP appointment pending scheduling in May 2022 . Next cardiology appointment is scheduled for 09/17/20 with Branch    Patient Care Plan: RNCM: Hypertension (Adult)    Problem Identified: Hypertension (Hypertension)   Priority: Medium    Long-Range Goal: Hypertension Monitored  Start Date: 07/25/2020  This Visit's Progress: Not on track  Priority: Medium  Note:   Objective: BP Readings from Last 3 Encounters:  06/17/20 138/84  03/14/20 140/87  03/10/20 (!) 148/100   Current Barriers:  . Chronic Disease Management support and education needs related to hypertension  in a patient with HLD, CHF, and prediabetes . Does not have a working blood pressure monitor  Nurse Case Manager Clinical Goal(s):  . patient will work with PCP to  address needs related to medical management of hypertension . patient will meet with RN Care Manager to address self-management of hypertension . patient will demonstrate improved health management independence as evidenced bychecking and recording blood pressure at least 3 times per week  Interventions:  . 1:1 collaboration with Loman Brooklyn, FNP regarding development and update of comprehensive plan of care as evidenced by provider attestation and co-signature . Inter-disciplinary care team collaboration (see longitudinal plan of care) . Evaluation of current treatment plan related to hypertension and patient's adherence to plan as established by provider. . Chart reviewed including relevant office notes and lab results . Reviewed and discussed medications o Lisinopril o Furosemide 74m PRN-does not take daily . Discussed recent in-office blood pressure readings . Discussed at home monitoring o Has a monitor but it doesn't work o [facility] [facility] [place] [facility] [place] [place] [facility] to let me know . Recommended that she check and record blood pressure at least 3 times per week and to call PCP with any readings outside of recommended range . Advised to take log to PCP and cardio appointments . Discussed current diet and verbal education provided on low sodium/DASH diet o Patient eats fresh/frozen fruits and vegetables and rinses canned vegetables  o Avoids fried/fatty foods. Bakes or broils food.  o Does not add extra salt to food. Uses Ms DASH . Reviewed upcoming appointments: cardiologist in May and needs to schedule PCP follow-up in May . Provided with RN Care Manager contact numebr and encouraged to reach out as needed  Patient Goals/Self-Care Activities Over the next 30 days, patient will: . [facility] . Let RNCM know if she can't [facility] blood [facility] . Check and record blood pressure at least 3 times per week . Take log to PCP and cardiology appointment . Schedule f/u with PCP for May 2022 . Continue low sodium diet . Continue with physical therapy sessions and work on increasing physical activity level with an ultimate goal of at least 150 minutes a week  Follow Up Plan:  . Telephone follow up appointment with care management team member scheduled for: 09/12/20 with RNCM . The patient has been provided with contact information for the care management team and has been advised to call with any health related questions or concerns.  . Next PCP appointment pending scheduling in May 2022 . [facility]

## 2020-07-25 NOTE — Chronic Care Management (AMB) (Signed)
Chronic Care Management   CCM RN Visit Note  07/25/2020 Name: Allison Rollins MRN: [id number] DOB: 1950-10-06  Subjective: Allison Rollins is a 70 y.o. year old female who is a primary care patient of Loman Brooklyn, FNP. The care management team was consulted for assistance with disease management and care coordination needs.    Engaged with patient by telephone for initial visit in response to provider referral for case management and/or care coordination services.   Consent to Services:  The patient was given the following information about Chronic Care Management services today, agreed to services, and gave verbal consent: 1. CCM service includes personalized support from designated clinical staff supervised by the primary care provider, including individualized plan of care and coordination with other care providers 2. 24/7 contact phone numbers for assistance for urgent and routine care needs. 3. Service will only be billed when office clinical staff spend 20 minutes or more in a month to coordinate care. 4. Only one practitioner may furnish and bill the service in a calendar month. 5.The patient may stop CCM services at any time (effective at the end of the month) by phone call to the office staff. 6. The patient will be responsible for cost sharing (co-pay) of up to 20% of the service fee (after annual deductible is met). Patient agreed to services and consent obtained.  Patient agreed to services and verbal consent obtained.   Assessment: Review of patient past medical history, allergies, medications, health status, including review of consultants reports, laboratory and other test data, was performed as part of comprehensive evaluation and provision of chronic care management services.   SDOH (Social Determinants of Health) assessments and interventions performed:    CCM Care Plan  Allergies  Allergen Reactions  . Penicillins Itching and Rash    Has patient had a PCN reaction causing  immediate rash, facial/tongue/throat swelling, SOB or lightheadedness with hypotension: no Has patient had a PCN reaction causing severe rash involving mucus membranes or skin necrosis: no Has patient had a PCN reaction that required hospitalization: no Has patient had a PCN reaction occurring within the last 10 years: no If all of the above answers are "NO", then may proceed with Cephalosporin use.   . Sulfa Antibiotics Rash    Outpatient Encounter Medications as of 07/25/2020  Medication Sig  . albuterol (PROVENTIL HFA;VENTOLIN HFA) 108 (90 Base) MCG/ACT inhaler Inhale 2 puffs into the lungs every 6 (six) hours as needed for wheezing.  Marland Kitchen albuterol (PROVENTIL) (2.5 MG/3ML) 0.083% nebulizer solution Take 3 mLs (2.5 mg total) by nebulization every 6 (six) hours as needed for wheezing or shortness of breath.  . Alcohol Swabs (B-D SINGLE USE SWABS REGULAR) PADS Test sugars daily  . aspirin EC 81 MG tablet Take 1 tablet (81 mg total) by mouth daily.  Marland Kitchen atorvastatin (LIPITOR) 40 MG tablet Take 1 tablet (40 mg total) by mouth daily.  . cetirizine (ZYRTEC) 10 MG tablet Take 1 tablet (10 mg total) by mouth daily.  . cholecalciferol (VITAMIN D3) 25 MCG (1000 UT) tablet Take 1,000 Units by mouth 2 (two) times daily.   . dapagliflozin propanediol (FARXIGA) 10 MG TABS tablet Take 1 tablet (10 mg total) by mouth daily before breakfast.  . fluticasone (FLONASE) 50 MCG/ACT nasal spray Use 2 sprays into each nostril every day  . furosemide (LASIX) 20 MG tablet Take 1 tablet (20 mg total) by mouth daily as needed.  . gabapentin (NEURONTIN) 300 MG capsule Take 1 capsule (300  mg total) by mouth in the morning AND 2 capsules (600 mg total) at bedtime.  Marland Kitchen glucose blood (ACCU-CHEK AVIVA PLUS) test strip Test sugars daily  . lisinopril (ZESTRIL) 10 MG tablet Take 1 tablet (10 mg total) by mouth daily.  . methocarbamol (ROBAXIN) 500 MG tablet Take 1 tablet (500 mg total) by mouth 3 (three) times daily as needed for  muscle spasms.  . Multiple Vitamin (MULTIVITAMIN WITH MINERALS) TABS tablet Take 1 tablet by mouth daily.  . nitroGLYCERIN (NITROSTAT) 0.4 MG SL tablet Place 1 tablet (0.4 mg total) under the tongue every 5 (five) minutes as needed for chest pain.  Marland Kitchen nystatin cream (MYCOSTATIN) Apply 1 application topically twice daily  . Omega-3 Fatty Acids (FISH OIL PO) Take 1 capsule by mouth daily.   . pantoprazole (PROTONIX) 40 MG tablet Take 1 tablet by mouth twice daily  . potassium chloride SA (K-DUR) 20 MEQ tablet Take 20 mEq by mouth daily as needed (with lasix).  . pramipexole (MIRAPEX) 0.5 MG tablet Take 1 tablet (0.5 mg total) by mouth at bedtime.  . sertraline (ZOLOFT) 100 MG tablet Take 1.5 tablets by mouth every day   No facility-administered encounter medications on file as of 07/25/2020.    Patient Active Problem List   Diagnosis Date Noted  . Stage 3a chronic kidney disease ([place]) 06/17/2020  . Osteopenia 01/22/2020  . Neuropathy 10/21/2019  . 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 Achalasia of esophagus   . Vitamin D deficiency 11/24/2018  . Restless leg 11/24/2018  . Erosive gastritis   . Seasonal allergic rhinitis due to pollen 07/20/2018  . Recurrent depression ([place]) 12/26/2015  . Generalized anxiety disorder 03/12/2015  . Prediabetes 03/12/2015  . Back pain 03/12/2015  . Stress incontinence 01/06/2015  . Chronic diastolic heart failure ([place]) 01/03/2015  . Obesity (BMI 30-39.9) 07/01/2011  . Mixed hyperlipidemia 07/01/2011  . GERD (gastroesophageal reflux disease) 09/08/2010    Conditions to be addressed/monitored:HTN, HLD and prediabetes and moderate fall risk  Care Plan : RNCM: Wellness (Adult)  Updates made by Ilean China, RN since 07/25/2020 12:00 AM    Problem: Healthy Nutrition (Wellness)     Long-Range Goal: Manage My Cholesterol   Start Date: 07/25/2020  This Visit's Progress: On track  Priority: Medium  Note:   Objective: Lab Results  Component Value Date    CHOL 179 03/18/2020   HDL 53 03/18/2020   LDLCALC 103 (H) 03/18/2020   TRIG 133 03/18/2020   CHOLHDL 3.4 03/18/2020   Current Barriers:  . Chronic Disease Management support and education needs related to hyperlipidemia in a patient with HTN, CHF, and CKD  Nurse Case Manager Clinical Goal(s):  . patient will work with PCP to address needs related to medical management of hyperlipidemia . patient will meet with RN Care Manager to address self-management of hyperlipidemia . patient will demonstrate improved adherence to prescribed treatment plan for hyperlipidemia as evidenced byan LDL within provider recommended range  Interventions:  . 1:1 collaboration with Loman Brooklyn, FNP regarding development and update of comprehensive plan of care as evidenced by provider attestation and co-signature . Inter-disciplinary care team collaboration (see longitudinal plan of care) . Evaluation of current treatment plan related to hyperlipidemia and patient's adherence to plan as established by provider. . Chart reviewed including relevant office notes and lab results o Discussed recent lab results and provider recommendation . Reviewed and discussed mediations o Lipitor 17m daily o Fish oil o Compliant with medications . Discussed Diet o  Enjoys fruits, vegetables, beans, and meat/chicken o Does not eat fried foods. Bakes or broils them.  o Drinks grapefruit juice - Educated on reaction between lipitor and grapefruit/grapefruit juice and recommended that she only have that on rare occassions . Denies myalgias  . Discussed physical activity and mobility o Patient is physically deconditioned and is having outpatient physical therapy for strength and gait training o Uses a walker for ambulation . Reviewed upcoming appointments o Due for f/u with PCP in May 2022 and that has not been scheduled o RNCM will collaborate with front office staff to schedule follow up visit . Provided with RNCM  contact number and encouraged to reach out as needed  Patient Goals/Self-Care Activities Over the next 30 days, patient will: . Continue physical therapy to improve strength and balance . Increase physical activity level as tolerated with a goal of at least 150 minutes a week . Continue to eat a heart healthy diet and avoid fried/fatty foods . Avoid grapefruit juice and grapfruits . Schedule appointment with PCP for the end of May 2022 for folllow-up . Call RN Care Manager as needed [phone]    Long-Range Goal: Prevent Diabetes   Start Date: 07/25/2020  This Visit's Progress: On track  Priority: Medium  Note:   Objective: Lab Results  Component Value Date   HGBA1C 5.7 06/17/2020   HGBA1C 5.6 01/22/2020   HGBA1C 5.6 10/19/2019   Lab Results  Component Value Date   LDLCALC 103 (H) 03/18/2020   CREATININE 0.83 06/17/2020   Current Barriers:  . Chronic Disease Management support and education needs related to prediabetes in a patient with HLD, HTN, CHF, and CKD . Physical deconditioning   Nurse Case Manager Clinical Goal(s):  . patient will work with PCP to address needs related to medical management of prediabetes . patient will meet with RN Care Manager to address self-management of prediabetes . the patient will demonstrate ongoing self health care management ability as evidenced by maintaining an A1C of <6*  Interventions:  . 1:1 collaboration with Loman Brooklyn, FNP regarding development and update of comprehensive plan of care as evidenced by provider attestation and co-signature . Inter-disciplinary care team collaboration (see longitudinal plan of care) . Evaluation of current treatment plan related to prediabetes and patient's adherence to plan as established by provider. . Reviewed relevant office notes and lab results . Discussed recent A1C results . Reviewed and discussed medications o Farxiga  - receiving through prescription assistance program. May need to  fill out a new application. - Compliant with medication . Reviewed and discussed diet o Drinks two regular sodas a day o Eats fruits, vegetables, beans, and meat/chicken o Avoids fried/fatty foods o Does not eat a low carb diet . Discussed home blood sugar testing o Testing daily o Needs refill of lancets and test strips - RNCM will send request to [facility] clinical staff o Normally runs below 150 with no readings below 70 or above 200 . Reviewed upcoming appointments and recommended she schedule a f/u with PCP in May 2022 . Provided with RNCM contact number and encouraged to reach out as needed  Patient Goals/Self-Care Activities Over the next 30 days, patient will: . Take Wilder Glade as prescribed . Work with [facility] if new prescription assistance application is needed for [place] . Limit sugar and simple carbohydrates in diet . Reach out to [facility] clinical staff if lancets and test strips are not filled at preferred pharmacy . Schedule appt with PCP for May 2022 .  Increase physical activity level with a goal of at least 150 min a week . Call RNCM as needed    Problem: Fall Risk     Long-Range Goal: Absence of Fall and Fall-Related Injury   Start Date: 07/25/2020  This Visit's Progress: On track  Priority: High  Note:   Current Barriers:  . Care Coordination needs related to increased fall risk in a patient with hypertension, prediabetes, CKD, CHF  . Physical deconditioning  Nurse Case Manager Clinical Goal(s):  . patient will meet with RN Care Manager to address self-management of fall prevention . the patient will demonstrate ongoing self health care management ability as evidenced by using assistive devices for ambulation and attending physical therapy sessions for strength and gait training*  Interventions:  . 1:1 collaboration with Loman Brooklyn, FNP regarding development and update of comprehensive plan of care as evidenced by provider attestation and  co-signature . Inter-disciplinary care team collaboration (see longitudinal plan of care) . Reviewed and discussed medications . Falls assessment performed o 2 falls in past year o One off of porch and one out of shower o Bruising but no major injuries . Verbal fall prevention education provided . Discussed physical deconditioning . Encouraged to continue with physical therapy sessions for strength and gait training . Reviewed and discussed assistive devices o Uses walker for ambulation  o Has a shower chair o Keeps cell phone with her at all times . Provided with RN Care Manager contact number and encouraged to reach out as needed  Patient Goals/Self-Care Activities Over the next 30 days, patient will: Marland Kitchen Use walker for ambulation . Keep cell phone with her at all times . Move carefully and change positions slowly . Attend all physical therapy sessions . Call RN Care Manager as needed [phone]    Care Plan : RNCM: Hypertension (Adult)  Updates made by Ilean China, RN since 07/25/2020 12:00 AM    Problem: Hypertension (Hypertension)   Priority: Medium    Long-Range Goal: Hypertension Monitored   Start Date: 07/25/2020  This Visit's Progress: Not on track  Priority: Medium  Note:   Objective: BP Readings from Last 3 Encounters:  06/17/20 138/84  03/14/20 140/87  03/10/20 (!) 148/100   Current Barriers:  . Chronic Disease Management support and education needs related to hypertension  in a patient with HLD, CHF, and prediabetes . Does not have a working blood pressure monitor  Nurse Case Manager Clinical Goal(s):  . patient will work with PCP to address needs related to medical management of hypertension . patient will meet with RN Care Manager to address self-management of hypertension . patient will demonstrate improved health management independence as evidenced bychecking and recording blood pressure at least 3 times per week  Interventions:  . 1:1  collaboration with Loman Brooklyn, FNP regarding development and update of comprehensive plan of care as evidenced by provider attestation and co-signature . Inter-disciplinary care team collaboration (see longitudinal plan of care) . Evaluation of current treatment plan related to hypertension and patient's adherence to plan as established by provider. . Chart reviewed including relevant office notes and lab results . Reviewed and discussed medications o Lisinopril o Furosemide 85m PRN-does not take daily . Discussed recent in-office blood pressure readings . Discussed at home monitoring o Has a monitor but it doesn't work o [facility] [facility] [place] [facility] [place] to [facility] through them to let me know . Recommended that she check and  record blood pressure at least 3 times per week and to call PCP with any readings outside of recommended range . Advised to take log to PCP and cardio appointments . Discussed current diet and verbal education provided on low sodium/DASH diet o Patient eats fresh/frozen fruits and vegetables and rinses canned vegetables  o Avoids fried/fatty foods. Bakes or broils food.  o Does not add extra salt to food. Uses Ms DASH . Reviewed upcoming appointments: cardiologist in May and needs to schedule PCP follow-up in May . Provided with RN Care Manager contact numebr and encouraged to reach out as needed  Patient Goals/Self-Care Activities Over the next 30 days, patient will: . Obtain blood pressure monitor for home use through [facility] resource book . Let RNCM know if she can't order blood pressure monitor through [facility] resource book . Check and record blood pressure at least 3 times per week . Take log to PCP and cardiology appointment . Schedule f/u with PCP for May 2022 . Continue low sodium diet . Continue with physical therapy sessions and work on increasing physical activity level with an ultimate goal of at least 150 minutes a  week     Follow Up Plan:  . Telephone follow up appointment with care management team member scheduled for: 09/12/20 with RNCM . The patient has been provided with contact information for the care management team and has been advised to call with any health related questions or concerns.  . Next PCP appointment pending scheduling in May 2022 . Next cardiology appointment is scheduled for 09/17/20 with Branch  Chong Sicilian, BSN, RN-BC [place] [facility] [place] Management Direct Dial: [phone]

## 2020-07-25 NOTE — Telephone Encounter (Signed)
Left message to call back  

## 2020-07-25 NOTE — Telephone Encounter (Signed)
07/25/2020  Patient requesting refill of glucose test strips and lancets. Last filled at [facility] in [place].   Would also like a refill of albuterol inhaler and/or nebulizer solution to have on hand as needed. Last prescribed for bronchitis but has some difficulty breathing with spring time allergies.   Patient also needs to be scheduled with [name], FNP for a follow-up at the end of May.   Forwarding to [facility] Clinical staff for review and assistance.    Demetrios Loll, BSN, RN-BC Embedded Chronic Care Manager [facility] Care Management Direct Dial: [phone]

## 2020-07-28 ENCOUNTER — Ambulatory Visit: Payer: Medicare Other | Admitting: Physical Therapy

## 2020-07-28 ENCOUNTER — Ambulatory Visit: Payer: Medicare Other | Admitting: *Deleted

## 2020-07-28 DIAGNOSIS — I10 Essential (primary) hypertension: Secondary | ICD-10-CM | POA: Diagnosis not present

## 2020-07-28 DIAGNOSIS — R7303 Prediabetes: Secondary | ICD-10-CM

## 2020-07-28 DIAGNOSIS — E782 Mixed hyperlipidemia: Secondary | ICD-10-CM

## 2020-07-28 NOTE — Patient Instructions (Signed)
Visit Information  PATIENT GOALS: Goals Addressed            This Visit's Progress   . Mediation Management       Timeframe:  Short-Term Goal Priority:  Medium Start Date:   07/25/20                          Expected End Date:   08/04/20                  Follow-up: 09/12/20  . Talk with PCP about prescription refill requests . Talk with PCP office about prescription assistance for Farxiga . Call RN Care Manager as needed [phone]       Patient verbalizes understanding of instructions provided today and agrees to view in MyChart.    Follow Up Plan:  RN Care Manager telephone follow-up scheduled for 09/12/20  Provided with RN Care Manager contact number and advised to reach out as needed  Demetrios Loll, BSN, RN-BC Embedded Chronic Care Manager [facility] Care Management Direct Dial: [phone]

## 2020-07-28 NOTE — Chronic Care Management (AMB) (Signed)
Chronic Care Management   CCM RN Visit Note  07/28/2020 Name: Allison Rollins MRN: [id number] DOB: 1950-11-18  Subjective: Allison Rollins is a 70 y.o. year old female who is a primary care patient of Gwenlyn Fudge, FNP. The care management team was consulted for assistance with disease management and care coordination needs.    Engaged with patient by telephone for a return telephone call in response to provider referral for case management and/or care coordination services.   Consent to Services:  The patient was given information about Chronic Care Management services, agreed to services, and gave verbal consent prior to initiation of services.  Please see initial visit note for detailed documentation.   Patient agreed to services and verbal consent obtained.   Assessment: Review of patient past medical history, allergies, medications, health status, including review of consultants reports, laboratory and other test data, was performed as part of comprehensive evaluation and provision of chronic care management services.   SDOH (Social Determinants of Health) assessments and interventions performed:    CCM Care Plan  Allergies  Allergen Reactions  . Penicillins Itching and Rash    Has patient had a PCN reaction causing immediate rash, facial/tongue/throat swelling, SOB or lightheadedness with hypotension: no Has patient had a PCN reaction causing severe rash involving mucus membranes or skin necrosis: no Has patient had a PCN reaction that required hospitalization: no Has patient had a PCN reaction occurring within the last 10 years: no If all of the above answers are "NO", then may proceed with Cephalosporin use.   . Sulfa Antibiotics Rash    Outpatient Encounter Medications as of 07/28/2020  Medication Sig  . albuterol (PROVENTIL HFA;VENTOLIN HFA) 108 (90 Base) MCG/ACT inhaler Inhale 2 puffs into the lungs every 6 (six) hours as needed for wheezing.  Marland Kitchen albuterol (PROVENTIL) (2.5  MG/3ML) 0.083% nebulizer solution Take 3 mLs (2.5 mg total) by nebulization every 6 (six) hours as needed for wheezing or shortness of breath.  . Alcohol Swabs (B-D SINGLE USE SWABS REGULAR) PADS Test sugars daily  . aspirin EC 81 MG tablet Take 1 tablet (81 mg total) by mouth daily.  Marland Kitchen atorvastatin (LIPITOR) 40 MG tablet Take 1 tablet (40 mg total) by mouth daily.  . cetirizine (ZYRTEC) 10 MG tablet Take 1 tablet (10 mg total) by mouth daily.  . cholecalciferol (VITAMIN D3) 25 MCG (1000 UT) tablet Take 1,000 Units by mouth 2 (two) times daily.   . dapagliflozin propanediol (FARXIGA) 10 MG TABS tablet Take 1 tablet (10 mg total) by mouth daily before breakfast.  . fluticasone (FLONASE) 50 MCG/ACT nasal spray Use 2 sprays into each nostril every day  . furosemide (LASIX) 20 MG tablet Take 1 tablet (20 mg total) by mouth daily as needed.  . gabapentin (NEURONTIN) 300 MG capsule Take 1 capsule (300 mg total) by mouth in the morning AND 2 capsules (600 mg total) at bedtime.  Marland Kitchen glucose blood (ACCU-CHEK AVIVA PLUS) test strip Test sugars daily  . lisinopril (ZESTRIL) 10 MG tablet Take 1 tablet (10 mg total) by mouth daily.  . methocarbamol (ROBAXIN) 500 MG tablet Take 1 tablet (500 mg total) by mouth 3 (three) times daily as needed for muscle spasms.  . Multiple Vitamin (MULTIVITAMIN WITH MINERALS) TABS tablet Take 1 tablet by mouth daily.  . nitroGLYCERIN (NITROSTAT) 0.4 MG SL tablet Place 1 tablet (0.4 mg total) under the tongue every 5 (five) minutes as needed for chest pain.  Marland Kitchen nystatin cream (MYCOSTATIN) Apply  1 application topically twice daily  . Omega-3 Fatty Acids (FISH OIL PO) Take 1 capsule by mouth daily.   . pantoprazole (PROTONIX) 40 MG tablet Take 1 tablet by mouth twice daily  . potassium chloride SA (K-DUR) 20 MEQ tablet Take 20 mEq by mouth daily as needed (with lasix).  . pramipexole (MIRAPEX) 0.5 MG tablet Take 1 tablet (0.5 mg total) by mouth at bedtime.  . sertraline (ZOLOFT) 100  MG tablet Take 1.5 tablets by mouth every day   No facility-administered encounter medications on file as of 07/28/2020.    Patient Active Problem List   Diagnosis Date Noted  . Stage 3a chronic kidney disease (HCC) 06/17/2020  . Osteopenia 01/22/2020  . Neuropathy 10/21/2019  . 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 Achalasia of esophagus   . Vitamin D deficiency 11/24/2018  . Restless leg 11/24/2018  . Erosive gastritis   . Seasonal allergic rhinitis due to pollen 07/20/2018  . Recurrent depression (HCC) 12/26/2015  . Generalized anxiety disorder 03/12/2015  . Prediabetes 03/12/2015  . Back pain 03/12/2015  . Stress incontinence 01/06/2015  . Chronic diastolic heart failure (HCC) 01/03/2015  . Obesity (BMI 30-39.9) 07/01/2011  . Mixed hyperlipidemia 07/01/2011  . GERD (gastroesophageal reflux disease) 09/08/2010    Conditions to be addressed/monitored:CHF, HTN, HLD, Depression and prediabetes  Care Plan : RNCM: Wellness (Adult)  Updates made by Gwenith Daily, RN since 07/28/2020 12:00 AM    Problem: Medication Management   Priority: Medium    Goal: Refill Medications   Start Date: 07/25/2020  This Visit's Progress: Not on track  Priority: Medium  Note:   Current Barriers:  . Financial Constraints.  Posey Rea if Patient Prescription Assistance expired at the end of 2021 . Medical Conditions: HTN, HLD, prediabetes, depression, CHF  Nurse Case Manager Clinical Goal(s):  . patient will work with PCP office to address needs related to Prescription Assistance for [place] other requested refills  Interventions:  . 1:1 collaboration with Gwenlyn Fudge, FNP regarding development and update of comprehensive plan of care as evidenced by provider attestation and co-signature . Inter-disciplinary care team collaboration (see longitudinal plan of care) . Chart reviewed including PCP and PharmD office notes . Reviewed and discussed medications . Discussed prescription assistance  through [employer] for Marcelline Deist o Has a little more than 30 day supply left o Reports that she typically receives two bottles at a time but can't recall if she's received any in 2022 - I expect that has since we're 4 months into 2022 o Chart reviewed but unable to find a scanned copy of application . Explained that a script had been sent in to [facility] and that there should be one 90 day refill remaining o Patient has contacted them and there was going to be a $68 cost o This may be the pharmacy that [employer] uses for prescription assistance. If so, then her approval likely ran out in 2021 and she will need a new 2022 application . Telephone message sent to [facility] Clinical staff on 07/25/20 requesting assistance with a few Rx refills . Telephone message updated today asking them to check for paper copy of application and let patient know if one for 2022 needs to be completed  Patient Goals/Self-Care Activities Over the next 7 days, patient will: . Talk with PCP about prescription refill requests . Talk with PCP office about prescription assistance for Farxiga . Call RN Care Manager as needed [phone]    Follow Up Plan:  RN Care Manager  telephone follow-up scheduled for 09/12/20  Provided with RN Care Manager contact number and advised to reach out as needed  Demetrios Loll, BSN, RN-BC Embedded Chronic Care Manager [facility] Care Management Direct Dial: [phone]

## 2020-07-28 NOTE — Telephone Encounter (Signed)
07/28/2020  Patient returning call from Friday.   Also would like to f/u on Farxiga prescription assistance through [employer]. She filled out application with Raynelle Fanning in November based on office notes but I don't see a scanned in copy of the application. Unless one was submitted for 2022, it likely expired 04/25/20. She has a little more than 30 days of her current supply. There may be a paper copy of the application in Julie's office.   Demetrios Loll, BSN, RN-BC Embedded Chronic Care Manager [facility] Care Management Direct Dial: [phone]

## 2020-07-29 NOTE — Telephone Encounter (Signed)
We will add this to the pt assistance desk to check on this - pt coming in today - will discuss then

## 2020-07-29 NOTE — Telephone Encounter (Signed)
Patient states that she has another month left and that it is coming to her house. It sounds like at this time she is receiving and does not need application renewed at this time.

## 2020-07-30 ENCOUNTER — Ambulatory Visit: Payer: Medicare Other | Admitting: Physical Therapy

## 2020-07-31 ENCOUNTER — Other Ambulatory Visit: Payer: Self-pay | Admitting: Family Medicine

## 2020-07-31 DIAGNOSIS — G2581 Restless legs syndrome: Secondary | ICD-10-CM

## 2020-07-31 DIAGNOSIS — G629 Polyneuropathy, unspecified: Secondary | ICD-10-CM

## 2020-07-31 DIAGNOSIS — E1169 Type 2 diabetes mellitus with other specified complication: Secondary | ICD-10-CM

## 2020-08-01 ENCOUNTER — Encounter: Payer: Self-pay | Admitting: Physical Therapy

## 2020-08-01 ENCOUNTER — Ambulatory Visit: Payer: Medicare Other | Attending: Family Medicine | Admitting: Physical Therapy

## 2020-08-01 ENCOUNTER — Other Ambulatory Visit: Payer: Self-pay

## 2020-08-01 DIAGNOSIS — R2681 Unsteadiness on feet: Secondary | ICD-10-CM | POA: Diagnosis present

## 2020-08-01 DIAGNOSIS — M6281 Muscle weakness (generalized): Secondary | ICD-10-CM | POA: Diagnosis present

## 2020-08-01 DIAGNOSIS — R262 Difficulty in walking, not elsewhere classified: Secondary | ICD-10-CM

## 2020-08-01 NOTE — Therapy (Signed)
[facility] [address] [place], [place], [address] Phone: [phone]   Fax:  [phone]  Physical Therapy Treatment  Patient Details  Name: Allison Rollins MRN: [id number] Date of Birth: Jun 22, 1950 Referring Provider (PT): [name]   Encounter Date: 08/01/2020   PT End of Session - 08/01/20 0950    Visit Number 5    Number of Visits 8    Date for PT Re-Evaluation 08/28/20    Authorization Type [employer] Medicare (CQ and KX modifier) Progress note every 10th visit    PT Start Time [id number]    PT Stop Time 1027    PT Time Calculation (min) 39 min    Equipment Utilized During Treatment Other (comment)   SPC   Activity Tolerance Patient tolerated treatment well    Behavior During Therapy [facility] for tasks assessed/performed           Past Medical History:  Diagnosis Date  . Anxiety   . Asthma   . Chest pain 12/22/2015  . CHF (congestive heart failure) (HCC)   . COPD (chronic obstructive pulmonary disease) (HCC)   . Depression   . Headache   . HTN (hypertension)   . Hypokalemia 07/28/2013  . Kidney stones   . Osteopenia 01/22/2020  . Parkinson's disease   . Pneumonia   . Pre-diabetes   . S/P colonoscopy 10/15/04   normal    Past Surgical History:  Procedure Laterality Date  . CHOLECYSTECTOMY    . COLONOSCOPY  10/15/2004   Normal rectum/Normal colon  . COLONOSCOPY N/A 05/10/2016   Procedure: COLONOSCOPY;  Surgeon: West Bali, MD;  [facility];  Service: Endoscopy;  Laterality: N/A;  10:30  . ESOPHAGEAL MANOMETRY N/A 04/02/2019   Procedure: ESOPHAGEAL MANOMETRY (EM);  Surgeon: Napoleon Form, MD;  Location: [facility];  Service: Endoscopy;  Laterality: N/A;  . ESOPHAGOGASTRODUODENOSCOPY  09/11/2010   Dysphagia likely multifactorial (possible Candida esophagitis, likely nonspecific esophageal motility disorder, and/or uncontrolled gastroesophageal reflux disease), status post empiric dilation  .  ESOPHAGOGASTRODUODENOSCOPY N/A 11/13/2018   Dr. Darrick Penna: No endoscopic esophageal abnormality to explain patient's dysphagia.  Esophagus dilated.  Moderate erosive/nodular gastritis with benign biopsies.  Marland Kitchen KIDNEY STONE SURGERY    . SAVORY DILATION N/A 11/13/2018   Procedure: SAVORY DILATION;  Surgeon: West Bali, MD;  [facility];  Service: Endoscopy;  Laterality: N/A;    There were no vitals filed for this visit.   Subjective Assessment - 08/01/20 0949    Subjective COVID-19 screening performed upon arrival. Doing ok today.    Pertinent History Neuropathy, Osteopenia, Stage 3 chronic kidney disease, DM, CHF, COPD, Depression, Parkinson's Disease    Limitations Walking;Standing;House hold activities    Patient Stated Goals improve moving around home better    Currently in Pain? No/denies              [facility] PT Assessment - 08/01/20 0001      Assessment   Medical Diagnosis Physical deconditioning    Referring Provider (PT) [name]    Next MD Visit "3 months"    Prior Therapy no      Precautions   Precautions Fall      Restrictions   Weight Bearing Restrictions No                         [facility] Adult PT Treatment/Exercise - 08/01/20 0001      Lumbar Exercises: Aerobic  Nustep L3, seat 8 x15 min      Lumbar Exercises: Seated   Long Arc Quad on Chair Strengthening;Both;2 sets;10 reps;Weights    LAQ on Chair Weights (lbs) 3    Hip Flexion on Ball AROM;Both;15 reps    Sit to Stand 10 reps    Other Seated Lumbar Exercises scapular retraction yellow theraband x20 reps      Knee/Hip Exercises: Seated   Ball Squeeze x20 reps    Clamshell with TheraBand Yellow   x20 reps   Other Seated Knee/Hip Exercises B heel/toe raise x15 reps    Hamstring Curl Strengthening;Both;1 set;15 reps    Hamstring Limitations yellow                       PT Long Term Goals - 06/26/20 2150      PT LONG TERM GOAL #1   Title Patient will be  independent with HEP and its progression.    Time 8    Period Weeks    Status New      PT LONG TERM GOAL #2   Title Patient will demonstrate 4/5  bilateral LE MMT to improve stability during functional tasks.    Time 8    Period Weeks    Status New      PT LONG TERM GOAL #3   Title Patient will perform 5x sit to stand test in 25 seconds or less with UE support to improve balance and improve functional LE strength.    Time 8    Period Weeks    Status New      PT LONG TERM GOAL #4   Title Patient will stand unsupported with wide base of support for 2 minutes independently to improve static balance for home tasks.    Time 8    Period Weeks    Status New                 Plan - 08/01/20 1104    Clinical Impression Statement Patient presented in clinic with reports of improvement that she has seen. Patient continues to be limited with prolonged standing for washing dishes, etc. Patient able to ambulate and transfer but at slower speeds. Patient limited with reps today secondary to fatigue.    Personal Factors and Comorbidities Age;Time since onset of injury/illness/exacerbation    Comorbidities Neuropathy, Osteopenia, Stage 3 chronic kidney disease, DM, CHF, COPD, Depression, Parkinson's Disease    Examination-Activity Limitations Bed Mobility;Locomotion Level;Transfers;Stand    Examination-Participation Restrictions Cleaning    Stability/Clinical Decision Making Stable/Uncomplicated    Rehab Potential Fair    PT Frequency 1x / week    PT Duration 8 weeks    PT Treatment/Interventions ADLs/Self Care Home Management;Gait training;Stair training;Functional mobility training;Therapeutic activities;Therapeutic exercise;Balance training;Neuromuscular re-education;Manual techniques;Passive range of motion;Patient/family education    PT Next Visit Plan nustep, LE strengthening and stretching, core stability, balance activities in sitting and standing    Pt asked about Rx for LB. Talk to  Pt about getting new order for LB.    Consulted and Agree with Plan of Care Patient           Patient will benefit from skilled therapeutic intervention in order to improve the following deficits and impairments:  Decreased balance,Decreased activity tolerance,Difficulty walking,Pain,Decreased strength,Postural dysfunction  Visit Diagnosis: Muscle weakness (generalized)  Difficulty in walking, not elsewhere classified  Unsteadiness on feet     Problem List Patient Active Problem List   Diagnosis Date Noted  .  Stage 3a chronic kidney disease (HCC) 06/17/2020  . Osteopenia 01/22/2020  . Neuropathy 10/21/2019  . 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 Achalasia of esophagus   . Vitamin D deficiency 11/24/2018  . Restless leg 11/24/2018  . Erosive gastritis   . Seasonal allergic rhinitis due to pollen 07/20/2018  . Recurrent depression (HCC) 12/26/2015  . Generalized anxiety disorder 03/12/2015  . Prediabetes 03/12/2015  . Back pain 03/12/2015  . Stress incontinence 01/06/2015  . Chronic diastolic heart failure (HCC) 01/03/2015  . Obesity (BMI 30-39.9) 07/01/2011  . Mixed hyperlipidemia 07/01/2011  . GERD (gastroesophageal reflux disease) 09/08/2010    Marvell Fuller, PTA 08/01/2020, 11:11 AM  [facility] [address] [place], [place], [address] Phone: [phone]   Fax:  [phone]  Name: Allison Rollins MRN: [id number] Date of Birth: July 04, 1950

## 2020-08-04 ENCOUNTER — Ambulatory Visit: Payer: Medicare Other | Admitting: Physical Therapy

## 2020-08-06 ENCOUNTER — Ambulatory Visit: Payer: Medicare Other | Admitting: Physical Therapy

## 2020-08-06 ENCOUNTER — Encounter: Payer: Self-pay | Admitting: Physical Therapy

## 2020-08-06 ENCOUNTER — Other Ambulatory Visit: Payer: Self-pay

## 2020-08-06 DIAGNOSIS — R262 Difficulty in walking, not elsewhere classified: Secondary | ICD-10-CM

## 2020-08-06 DIAGNOSIS — R2681 Unsteadiness on feet: Secondary | ICD-10-CM

## 2020-08-06 DIAGNOSIS — M6281 Muscle weakness (generalized): Secondary | ICD-10-CM | POA: Diagnosis not present

## 2020-08-06 NOTE — Therapy (Signed)
[facility] [place], [place], [address] Phone: [phone]   Fax:  [phone]  Physical Therapy Treatment  Patient Details  Name: Allison Rollins MRN: [id number] Date of Birth: 28-Nov-1950 Referring Provider (PT): [name]   Encounter Date: 08/06/2020   PT End of Session - 08/06/20 1413    Visit Number 6    Number of Visits 8    Date for PT Re-Evaluation 08/28/20    Authorization Type [employer] Medicare (CQ and KX modifier) Progress note every 10th visit    PT Start Time 1410    PT Stop Time 1445    PT Time Calculation (min) 35 min    Activity Tolerance Patient tolerated treatment well    Behavior During Therapy [facility] for tasks assessed/performed           Past Medical History:  Diagnosis Date  . Anxiety   . Asthma   . Chest pain 12/22/2015  . CHF (congestive heart failure) ([place])   . COPD (chronic obstructive pulmonary disease) ([place])   . Depression   . Headache   . HTN (hypertension)   . Hypokalemia 07/28/2013  . Kidney stones   . Osteopenia 01/22/2020  . Parkinson's disease   . Pneumonia   . Pre-diabetes   . S/P colonoscopy 10/15/04   normal    Past Surgical History:  Procedure Laterality Date  . CHOLECYSTECTOMY    . COLONOSCOPY  10/15/2004   Normal rectum/Normal colon  . COLONOSCOPY N/A 05/10/2016   Procedure: COLONOSCOPY;  Surgeon: Danie Binder, MD;  [facility];  Service: Endoscopy;  Laterality: N/A;  10:30  . ESOPHAGEAL MANOMETRY N/A 04/02/2019   Procedure: ESOPHAGEAL MANOMETRY (EM);  Surgeon: Mauri Pole, MD;  [facility];  Service: Endoscopy;  Laterality: N/A;  . ESOPHAGOGASTRODUODENOSCOPY  09/11/2010   Dysphagia likely multifactorial (possible Candida esophagitis, likely nonspecific esophageal motility disorder, and/or uncontrolled gastroesophageal reflux disease), status post empiric dilation  . ESOPHAGOGASTRODUODENOSCOPY N/A 11/13/2018   Dr. Oneida Alar: No  endoscopic esophageal abnormality to explain patient's dysphagia.  Esophagus dilated.  Moderate erosive/nodular gastritis with benign biopsies.  Marland Kitchen KIDNEY STONE SURGERY    . SAVORY DILATION N/A 11/13/2018   Procedure: SAVORY DILATION;  Surgeon: Danie Binder, MD;  [facility];  Service: Endoscopy;  Laterality: N/A;    There were no vitals filed for this visit.   Subjective Assessment - 08/06/20 1414    Subjective COVID-19 screening performed upon arrival. Patient 25 min late due to traffic.    Pertinent History Neuropathy, Osteopenia, Stage 3 chronic kidney disease, DM, CHF, COPD, Depression, Parkinson's Disease    Limitations Walking;Standing;House hold activities    Patient Stated Goals improve moving around home better    Currently in Pain? No/denies              [facility] PT Assessment - 08/06/20 0001      Strength   Right Hip Flexion 5/5    Left Hip Flexion 5/5    Right Knee Flexion 5/5    Right Knee Extension 4/5    Left Knee Flexion 5/5    Left Knee Extension 4+/5                         [facility] Adult PT Treatment/Exercise - 08/06/20 0001      Balance   Balance Assessed Yes      Static Standing Balance   Static Standing - Balance Support  No upper extremity supported    Static Standing - Level of Assistance 7: Independent    Static Standing - Comment/# of Minutes 2      Lumbar Exercises: Aerobic   Nustep L4 x 54mn      Lumbar Exercises: Seated   Long Arc Quad on Chair Both;5 reps    LAQ on Chair Weights (lbs) 4    LAQ on Chair Limitations 10 sec hold    Sit to Stand 5 reps   21.32 sec     Knee/Hip Exercises: Standing   Hip Abduction 10 reps;Both;Knee straight                       PT Long Term Goals - 08/06/20 1420      PT LONG TERM GOAL #1   Title Patient will be independent with HEP and its progression.    Status On-going      PT LONG TERM GOAL #2   Title Patient will demonstrate 4/5  bilateral LE MMT to improve  stability during functional tasks.    Status Partially Met      PT LONG TERM GOAL #3   Title Patient will perform 5x sit to stand test in 25 seconds or less with UE support to improve balance and improve functional LE strength.    Baseline 21.32 sec    Status Achieved      PT LONG TERM GOAL #4   Title Patient will stand unsupported with wide base of support for 2 minutes independently to improve static balance for home tasks.    Status Achieved      PT LONG TERM GOAL #5   Title --                 Plan - 08/06/20 1437    Clinical Impression Statement Patient reports she has made significant improvements with PT. She is able to stand and do her cooking and household chores. She is compliant with her HEP. She has made significant gains in her MMT and balance, however she still demonstrates LE weakness with gait. She requires VCs for full step length. Standing hip ABD was challenging today. She will benefit from further gluteus med strengthening for last 2 visits. Her 5 x sit to stand = 21.32 sec which still indicates a a fall risk, but is much improved from initial eval and she has good eccentric control. PT advised pt to wear more supportive shoes as she was wearing well worn flip flps today which were causing increased foot inversion.    PT Frequency 1x / week    PT Duration 8 weeks    PT Treatment/Interventions ADLs/Self Care Home Management;Gait training;Stair training;Functional mobility training;Therapeutic activities;Therapeutic exercise;Balance training;Neuromuscular re-education;Manual techniques;Passive range of motion;Patient/family education    PT Next Visit Plan Work on hip ABD/gluteus med and hip ext strength.  Pt asked about Rx for LB. Talk to Pt about getting new order for LB.    Consulted and Agree with Plan of Care Patient           Patient will benefit from skilled therapeutic intervention in order to improve the following deficits and impairments:  Decreased  balance,Decreased activity tolerance,Difficulty walking,Pain,Decreased strength,Postural dysfunction  Visit Diagnosis: Muscle weakness (generalized)  Difficulty in walking, not elsewhere classified  Unsteadiness on feet     Problem List Patient Active Problem List   Diagnosis Date Noted  . Stage 3a chronic kidney disease ([place] 06/17/2020  . Osteopenia 01/22/2020  .  Neuropathy 10/21/2019  . 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 Achalasia of esophagus   . Vitamin D deficiency 11/24/2018  . Restless leg 11/24/2018  . Erosive gastritis   . Seasonal allergic rhinitis due to pollen 07/20/2018  . Recurrent depression ([place]) 12/26/2015  . Generalized anxiety disorder 03/12/2015  . Prediabetes 03/12/2015  . Back pain 03/12/2015  . Stress incontinence 01/06/2015  . Chronic diastolic heart failure ([place]) 01/03/2015  . Obesity (BMI 30-39.9) 07/01/2011  . Mixed hyperlipidemia 07/01/2011  . GERD (gastroesophageal reflux disease) 09/08/2010    Madelyn Flavors PT 08/06/2020, 2:46 PM  [facility] [address] [place], [place], [address] Phone: [phone]   Fax:  [phone]  Name: Allison Rollins MRN: [id number] Date of Birth: 1950-10-15

## 2020-08-12 ENCOUNTER — Encounter: Payer: Medicare Other | Admitting: Physical Therapy

## 2020-08-18 ENCOUNTER — Encounter: Payer: Self-pay | Admitting: Family Medicine

## 2020-08-18 ENCOUNTER — Ambulatory Visit (INDEPENDENT_AMBULATORY_CARE_PROVIDER_SITE_OTHER): Payer: Medicare Other | Admitting: Family Medicine

## 2020-08-18 DIAGNOSIS — J209 Acute bronchitis, unspecified: Secondary | ICD-10-CM

## 2020-08-18 MED ORDER — AZITHROMYCIN 250 MG PO TABS: ORAL_TABLET | ORAL | 0 refills | Status: DC

## 2020-08-18 MED ORDER — ALBUTEROL SULFATE HFA 108 (90 BASE) MCG/ACT IN AERS: 2.0000 | INHALATION_SPRAY | Freq: Four times a day (QID) | RESPIRATORY_TRACT | 0 refills | Status: AC | PRN

## 2020-08-18 NOTE — Progress Notes (Signed)
   Virtual Visit  Note Due to COVID-19 pandemic this visit was conducted virtually. This visit type was conducted due to national recommendations for restrictions regarding the COVID-19 Pandemic (e.g. social distancing, sheltering in place) in an effort to limit this patient's exposure and mitigate transmission in our community. All issues noted in this document were discussed and addressed.  A physical exam was not performed with this format.  I connected with Allison Rollins on 08/18/20 at 1430 by telephone and verified that I am speaking with the correct person using two identifiers. Allison Rollins is currently located at home and no one is currently with her during the visit. The provider, Gabriel Earing, FNP is located in their office at time of visit.  I discussed the limitations, risks, security and privacy concerns of performing an evaluation and management service by telephone and the availability of in person appointments. I also discussed with the patient that there may be a patient responsible charge related to this service. The patient expressed understanding and agreed to proceed.  CC: wheezing  History and Present Illness:  HPI  Allison Rollins report nasal congestion, postnasal drip, and productive cough with yellow sputum x 7 days. She also reports body aches and intermittent shortness of breath and occasional wheezing. She has a history of asthma or COPD but she isn't sure which one. Denies chest pain, fever, nausea, vomiting, diarrhea, or edema. She has been taking coricidin with some relief. She has been staying well hydrated. She takes flonase and zyrtec daily for seasonal allergies.    ROS As per HPI.   Observations/Objective: Alert and oriented x 3. Able to speak in full sentences without difficulty.    Assessment and Plan: Cherisa was seen today for wheezing.  Diagnoses and all orders for this visit:  Acute bronchitis, unspecified organism Zpak and albuterol inhaler sent in.  Stay well hydrated. Rest. Continue coricidin, flonase, and zyrtec. Return to office for new or worsening symptoms, or if symptoms persist.  -     albuterol (VENTOLIN HFA) 108 (90 Base) MCG/ACT inhaler; Inhale 2 puffs into the lungs every 6 (six) hours as needed for wheezing or shortness of breath. -     azithromycin (ZITHROMAX Z-PAK) 250 MG tablet; As directed     Follow Up Instructions: Follow up to be evaluated in person if symptoms do not improve or worsen.     I discussed the assessment and treatment plan with the patient. The patient was provided an opportunity to ask questions and all were answered. The patient agreed with the plan and demonstrated an understanding of the instructions.   The patient was advised to call back or seek an in-person evaluation if the symptoms worsen or if the condition fails to improve as anticipated.  The above assessment and management plan was discussed with the patient. The patient verbalized understanding of and has agreed to the management plan. Patient is aware to call the clinic if symptoms persist or worsen. Patient is aware when to return to the clinic for a follow-up visit. Patient educated on when it is appropriate to go to the emergency department.   Time call ended: 1442   I provided 12 minutes of  non face-to-face time during this encounter.    Gabriel Earing, FNP

## 2020-08-19 ENCOUNTER — Encounter: Payer: Medicare Other | Admitting: Physical Therapy

## 2020-08-19 ENCOUNTER — Other Ambulatory Visit: Payer: Self-pay | Admitting: Family Medicine

## 2020-08-19 DIAGNOSIS — I5032 Chronic diastolic (congestive) heart failure: Secondary | ICD-10-CM

## 2020-08-19 DIAGNOSIS — J301 Allergic rhinitis due to pollen: Secondary | ICD-10-CM

## 2020-08-19 DIAGNOSIS — I10 Essential (primary) hypertension: Secondary | ICD-10-CM

## 2020-08-22 ENCOUNTER — Telehealth: Payer: Self-pay

## 2020-08-22 DIAGNOSIS — I5032 Chronic diastolic (congestive) heart failure: Secondary | ICD-10-CM

## 2020-08-22 DIAGNOSIS — N1831 Chronic kidney disease, stage 3a: Secondary | ICD-10-CM

## 2020-08-22 MED ORDER — DAPAGLIFLOZIN PROPANEDIOL 10 MG PO TABS: 10.0000 mg | ORAL_TABLET | Freq: Every day | ORAL | 6 refills | Status: DC

## 2020-08-22 NOTE — Telephone Encounter (Signed)
Refills sent for Farxiga 10mg  to [facility] (patient assistance pharmacy for [facility]); patient stable on this medication; she denies side effects  Patient notified and given number to call if refills haven't been received [phone]  Labs reviewed

## 2020-08-22 NOTE — Telephone Encounter (Signed)
Patient requesting a call from Raynelle Fanning in regards to medications.

## 2020-08-26 ENCOUNTER — Other Ambulatory Visit: Payer: Self-pay

## 2020-08-26 ENCOUNTER — Ambulatory Visit: Payer: Medicare Other | Attending: Family Medicine | Admitting: Physical Therapy

## 2020-08-26 ENCOUNTER — Encounter: Payer: Self-pay | Admitting: Physical Therapy

## 2020-08-26 DIAGNOSIS — R2681 Unsteadiness on feet: Secondary | ICD-10-CM | POA: Insufficient documentation

## 2020-08-26 DIAGNOSIS — M6281 Muscle weakness (generalized): Secondary | ICD-10-CM | POA: Diagnosis present

## 2020-08-26 DIAGNOSIS — R262 Difficulty in walking, not elsewhere classified: Secondary | ICD-10-CM | POA: Insufficient documentation

## 2020-08-26 NOTE — Therapy (Addendum)
[facility] [place], [place], [address] Phone: [phone]   Fax:  [phone]  Physical Therapy Treatment  Patient Details  Name: Allison Rollins MRN: [id number] Date of Birth: December 11, 1950 Referring Provider (PT): [name]   Encounter Date: 08/26/2020   PT End of Session - 08/26/20 1128    Visit Number 7    Number of Visits 8    Date for PT Re-Evaluation 08/28/20    Authorization Type [employer] Medicare (CQ and KX modifier) Progress note every 10th visit    PT Start Time 1119    PT Stop Time 1200    PT Time Calculation (min) 41 min    Equipment Utilized During Treatment Other (comment)   SPC   Activity Tolerance Patient tolerated treatment well    Behavior During Therapy [facility] for tasks assessed/performed           Past Medical History:  Diagnosis Date  . Anxiety   . Asthma   . Chest pain 12/22/2015  . CHF (congestive heart failure) ([place])   . COPD (chronic obstructive pulmonary disease) ([place])   . Depression   . Headache   . HTN (hypertension)   . Hypokalemia 07/28/2013  . Kidney stones   . Osteopenia 01/22/2020  . Parkinson's disease   . Pneumonia   . Pre-diabetes   . S/P colonoscopy 10/15/04   normal    Past Surgical History:  Procedure Laterality Date  . CHOLECYSTECTOMY    . COLONOSCOPY  10/15/2004   Normal rectum/Normal colon  . COLONOSCOPY N/A 05/10/2016   Procedure: COLONOSCOPY;  Surgeon: Danie Binder, MD;  Location: [facility];  Service: Endoscopy;  Laterality: N/A;  10:30  . ESOPHAGEAL MANOMETRY N/A 04/02/2019   Procedure: ESOPHAGEAL MANOMETRY (EM);  Surgeon: Mauri Pole, MD;  Location: [facility];  Service: Endoscopy;  Laterality: N/A;  . ESOPHAGOGASTRODUODENOSCOPY  09/11/2010   Dysphagia likely multifactorial (possible Candida esophagitis, likely nonspecific esophageal motility disorder, and/or uncontrolled gastroesophageal reflux disease), status post empiric dilation  .  ESOPHAGOGASTRODUODENOSCOPY N/A 11/13/2018   Dr. Oneida Alar: No endoscopic esophageal abnormality to explain patient's dysphagia.  Esophagus dilated.  Moderate erosive/nodular gastritis with benign biopsies.  Marland Kitchen KIDNEY STONE SURGERY    . SAVORY DILATION N/A 11/13/2018   Procedure: SAVORY DILATION;  Surgeon: Danie Binder, MD;  Location: [facility];  Service: Endoscopy;  Laterality: N/A;    There were no vitals filed for this visit.   Subjective Assessment - 08/26/20 1123    Subjective COVID-19 screening performed upon arrival. Patient reports that she hit her hand coming out the door this morning but feels better and safer at home.    Pertinent History Neuropathy, Osteopenia, Stage 3 chronic kidney disease, DM, CHF, COPD, Depression, Parkinson's Disease    Limitations Walking;Standing;House hold activities    Patient Stated Goals improve moving around home better    Currently in Pain? No/denies              [facility] PT Assessment - 08/26/20 0001      Assessment   Medical Diagnosis Physical deconditioning    Referring Provider (PT) [name]    Next MD Visit "3 months"    Prior Therapy no      Precautions   Precautions Fall      Restrictions   Weight Bearing Restrictions No      ROM / Strength   AROM / PROM / Strength Strength      Strength  Strength Assessment Site Knee    Right/Left Knee Right;Left    Right Knee Extension 4+/5    Left Knee Extension 4+/5                         [facility] Adult PT Treatment/Exercise - 08/26/20 0001      Lumbar Exercises: Aerobic   Nustep L4 x 16mn      Knee/Hip Exercises: Standing   Hip Flexion Stengthening;Both;2 sets;10 reps    Hip Flexion Limitations 2#    Hip Abduction Stengthening;Both;15 reps;Knee straight;Limitations    Abduction Limitations 2#    Forward Step Up Both;15 reps;Hand Hold: 2;Step Height: 4"      Knee/Hip Exercises: Seated   Long Arc Quad Strengthening;Both;20 reps;Weights    Long Arc Quad  Weight 4 lbs.    Ball Squeeze x20 reps    Clamshell with TheraBand Red   x20 reps   Hamstring Curl Strengthening;Both;2 sets;10 reps;Limitations    Hamstring Limitations red theraband                       PT Long Term Goals - 08/26/20 1132      PT LONG TERM GOAL #1   Title Patient will be independent with HEP and its progression.    Status Partially Met      PT LONG TERM GOAL #2   Title Patient will demonstrate 4/5  bilateral LE MMT to improve stability during functional tasks.    Status Achieved      PT LONG TERM GOAL #3   Title Patient will perform 5x sit to stand test in 25 seconds or less with UE support to improve balance and improve functional LE strength.    Baseline 21.32 sec    Status Achieved      PT LONG TERM GOAL #4   Title Patient will stand unsupported with wide base of support for 2 minutes independently to improve static balance for home tasks.    Status Achieved      PT LONG TERM GOAL #5   Title --                 Plan - 08/26/20 1219    Clinical Impression Statement Patient agrees to discharge as she has met most goals and feels safer within her home enviroment. Patient compliant with more activities and HEP at home. Patient able to tolerate therex fairly well although she fatigues quickly with therex. Standing exercises are more difficult due to fatigue.    Personal Factors and Comorbidities Age;Time since onset of injury/illness/exacerbation    Comorbidities Neuropathy, Osteopenia, Stage 3 chronic kidney disease, DM, CHF, COPD, Depression, Parkinson's Disease    Examination-Activity Limitations Bed Mobility;Locomotion Level;Transfers;Stand    Examination-Participation Restrictions Cleaning    Stability/Clinical Decision Making Stable/Uncomplicated    Rehab Potential Fair    PT Frequency 1x / week    PT Duration 8 weeks    PT Treatment/Interventions ADLs/Self Care Home Management;Gait training;Stair training;Functional mobility  training;Therapeutic activities;Therapeutic exercise;Balance training;Neuromuscular re-education;Manual techniques;Passive range of motion;Patient/family education    PT Next Visit Plan Work on hip ABD/gluteus med and hip ext strength.  Pt asked about Rx for LB. Talk to Pt about getting new order for LB.    Consulted and Agree with Plan of Care Patient           Patient will benefit from skilled therapeutic intervention in order to improve the following deficits and impairments:  Decreased balance,Decreased activity tolerance,Difficulty walking,Pain,Decreased strength,Postural dysfunction  Visit Diagnosis: Muscle weakness (generalized)  Difficulty in walking, not elsewhere classified  Unsteadiness on feet     Problem List Patient Active Problem List   Diagnosis Date Noted  . Stage 3a chronic kidney disease ([place]) 06/17/2020  . Osteopenia 01/22/2020  . Neuropathy 10/21/2019  . 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 Achalasia of esophagus   . Vitamin D deficiency 11/24/2018  . Restless leg 11/24/2018  . Erosive gastritis   . Seasonal allergic rhinitis due to pollen 07/20/2018  . Recurrent depression ([place]) 12/26/2015  . Generalized anxiety disorder 03/12/2015  . Prediabetes 03/12/2015  . Back pain 03/12/2015  . Stress incontinence 01/06/2015  . Chronic diastolic heart failure ([place]) 01/03/2015  . Obesity (BMI 30-39.9) 07/01/2011  . Mixed hyperlipidemia 07/01/2011  . GERD (gastroesophageal reflux disease) 09/08/2010   Standley Brooking, PTA 08/26/20 12:25 PM   [facility] [address] [place], [place], [address] Phone: [phone]   Fax:  [phone]  Name: SYMPHANI ECKSTROM MRN: [id number] Date of Birth: 15-Feb-1951  PHYSICAL THERAPY DISCHARGE SUMMARY  Visits from Start of Care: 7.  Current functional level related to goals / functional outcomes: See above.   Remaining deficits: Goals essentially met.   Education /  Equipment: HEP. Plan: Patient agrees to discharge.  Patient goals were partially met. Patient is being discharged due to being pleased with the current functional level.  ?????          Mali Applegate MPT

## 2020-08-30 ENCOUNTER — Other Ambulatory Visit: Payer: Self-pay | Admitting: Family Medicine

## 2020-08-30 DIAGNOSIS — K219 Gastro-esophageal reflux disease without esophagitis: Secondary | ICD-10-CM

## 2020-09-04 ENCOUNTER — Telehealth: Payer: Self-pay

## 2020-09-04 NOTE — Telephone Encounter (Signed)
She gets through [facility] patient patient assistance Tell her to call [phone] to check on status

## 2020-09-04 NOTE — Telephone Encounter (Signed)
Patient wanted to know if she had refills - patient aware she has refills.

## 2020-09-04 NOTE — Telephone Encounter (Signed)
Returned patients call Patient states she has not got her medication in the mail yet - advise patient to call the pharmacy

## 2020-09-04 NOTE — Telephone Encounter (Signed)
Attempted to contact patient - line busy x2 °

## 2020-09-04 NOTE — Telephone Encounter (Signed)
Pt wants to speak with Raynelle Fanning about her farxiga.

## 2020-09-08 ENCOUNTER — Other Ambulatory Visit: Payer: Self-pay | Admitting: *Deleted

## 2020-09-08 DIAGNOSIS — G2581 Restless legs syndrome: Secondary | ICD-10-CM

## 2020-09-08 MED ORDER — PRAMIPEXOLE DIHYDROCHLORIDE 0.5 MG PO TABS: 0.5000 mg | ORAL_TABLET | Freq: Every day | ORAL | 0 refills | Status: DC

## 2020-09-08 NOTE — Telephone Encounter (Signed)
Left message to call back  

## 2020-09-08 NOTE — Telephone Encounter (Signed)
Pt called in and told Mitzi that she got her medicine on Saturday

## 2020-09-10 ENCOUNTER — Other Ambulatory Visit: Payer: Self-pay

## 2020-09-10 ENCOUNTER — Encounter: Payer: Self-pay | Admitting: Family Medicine

## 2020-09-10 ENCOUNTER — Ambulatory Visit (INDEPENDENT_AMBULATORY_CARE_PROVIDER_SITE_OTHER): Payer: Medicare Other | Admitting: Family Medicine

## 2020-09-10 VITALS — BP 130/90 | HR 71 | Temp 98.2°F | Ht 66.0 in | Wt 232.8 lb

## 2020-09-10 DIAGNOSIS — E559 Vitamin D deficiency, unspecified: Secondary | ICD-10-CM

## 2020-09-10 DIAGNOSIS — K2100 Gastro-esophageal reflux disease with esophagitis, without bleeding: Secondary | ICD-10-CM

## 2020-09-10 DIAGNOSIS — N1831 Chronic kidney disease, stage 3a: Secondary | ICD-10-CM

## 2020-09-10 DIAGNOSIS — F411 Generalized anxiety disorder: Secondary | ICD-10-CM | POA: Diagnosis not present

## 2020-09-10 DIAGNOSIS — I5032 Chronic diastolic (congestive) heart failure: Secondary | ICD-10-CM

## 2020-09-10 DIAGNOSIS — F339 Major depressive disorder, recurrent, unspecified: Secondary | ICD-10-CM

## 2020-09-10 DIAGNOSIS — G2581 Restless legs syndrome: Secondary | ICD-10-CM

## 2020-09-10 DIAGNOSIS — G8929 Other chronic pain: Secondary | ICD-10-CM

## 2020-09-10 DIAGNOSIS — M545 Low back pain, unspecified: Secondary | ICD-10-CM

## 2020-09-10 DIAGNOSIS — R7303 Prediabetes: Secondary | ICD-10-CM | POA: Diagnosis not present

## 2020-09-10 DIAGNOSIS — L409 Psoriasis, unspecified: Secondary | ICD-10-CM

## 2020-09-10 MED ORDER — METHOCARBAMOL 500 MG PO TABS: 500.0000 mg | ORAL_TABLET | Freq: Three times a day (TID) | ORAL | 2 refills | Status: DC | PRN

## 2020-09-10 MED ORDER — PRAMIPEXOLE DIHYDROCHLORIDE 0.5 MG PO TABS: 0.5000 mg | ORAL_TABLET | Freq: Every day | ORAL | 1 refills | Status: DC

## 2020-09-10 MED ORDER — PANTOPRAZOLE SODIUM 40 MG PO TBEC: 1.0000 | DELAYED_RELEASE_TABLET | Freq: Two times a day (BID) | ORAL | 1 refills | Status: DC

## 2020-09-10 MED ORDER — CLOBETASOL PROPIONATE 0.05 % EX SOLN: CUTANEOUS | 0 refills | Status: DC

## 2020-09-10 NOTE — Progress Notes (Signed)
Assessment & Plan:  1. Recurrent depression (HCC) Well controlled on current regimen.   2. Generalized anxiety disorder Well controlled on current regimen.   3. Prediabetes Controlled.  4. Gastroesophageal reflux disease with esophagitis, unspecified whether hemorrhage Well controlled on current regimen.  - pantoprazole (PROTONIX) 40 MG tablet; Take 1 tablet (40 mg total) by mouth 2 (two) times daily.  Dispense: 180 tablet; Refill: 1  5. Restless leg Well controlled on current regimen.  - pramipexole (MIRAPEX) 0.5 MG tablet; Take 1 tablet (0.5 mg total) by mouth at bedtime.  Dispense: 90 tablet; Refill: 1  6. Chronic bilateral low back pain without sciatica Well controlled on current regimen.  - methocarbamol (ROBAXIN) 500 MG tablet; Take 1 tablet (500 mg total) by mouth 3 (three) times daily as needed for muscle spasms.  Dispense: 30 tablet; Refill: 2  7. Chronic diastolic heart failure (HCC) Continue Farxiga.  8. Stage 3a chronic kidney disease (HCC) Continue Farxiga.  9. Vitamin D deficiency Well controlled on current regimen.   10. Scalp psoriasis Started clobetasol solution.  - clobetasol (TEMOVATE) 0.05 % external solution; Apply topically onto dry scalp once daily in a thin film to affected areas only; leave in place 15 minutes before lathering and rinsing. Use x2 weeks.  Dispense: 50 mL; Refill: 0   Return in about 3 months (around 12/11/2020) for annual physical.  [name], MSN, APRN, FNP-C [facility]  Subjective:    Patient ID: Allison Rollins, female    DOB: 06-24-1950, 70 y.o.   MRN: [id number]  Patient Care Team: Gwenlyn Fudge, FNP as PCP - General (Family Medicine) Wyline Mood Dorothe Pea, MD as PCP - Cardiology (Cardiology) West Bali, MD (Inactive) (Gastroenterology) Beryle Beams, MD as Consulting Physician (Neurology) Wyline Mood, Dorothe Pea, MD as Consulting Physician (Cardiology) Gwenith Daily, RN as [facility] Care Management   Chief Complaint:  Chief Complaint  Patient presents with  . Knee Pain  . Prediabetes    Check up of chronic medical conditions     HPI: TRENITA HULME is a 70 y.o. female presenting on 09/10/2020 for Knee Pain and Prediabetes (Check up of chronic medical conditions )  Depression/anxiety: Patient feels she is well controlled with sertraline.  States occasionally she gets a little nervous, but when this happens she sits down with her Bible and immediately feels better.  Depression screen [facility] 2/9 09/10/2020 06/17/2020 03/14/2020  Decreased Interest 3 0 0  Down, Depressed, Hopeless 2 0 1  PHQ - 2 Score 5 0 1  Altered sleeping 3 2 0  Tired, decreased energy 3 2 1   Change in appetite 2 0 0  Feeling bad or failure about yourself  2 0 0  Trouble concentrating 1 0 0  Moving slowly or fidgety/restless 1 0 2  Suicidal thoughts 0 0 0  PHQ-9 Score 17 4 4   Difficult doing work/chores Not difficult at all Not difficult at all -  Some recent data might be hidden   GAD 7 : Generalized Anxiety Score 09/10/2020 06/17/2020 03/14/2020 01/22/2020  Nervous, Anxious, on Edge 2 1 1 2   Control/stop worrying 1 1 2 3   Worry too much - different things 2 1 1 3   Trouble relaxing 0 1 0 1  Restless 2 1 0 3  Easily annoyed or irritable 1 0 0 1  Afraid - awful might happen 0 0 0 0  Total GAD 7 Score 8 5 4 13   Anxiety Difficulty Not  difficult at all Not difficult at all - -    Difficulty sleeping: Patient reports she is taking ZzzQuil at bedtime which is effective.  Prediabetes: Most recent A1c 5.7 less than 3 months ago.  Vitamin D deficiency: Well-controlled with vitamin D supplement of 1,000 units twice daily.  Patient is taking Marcelline Deist due to CKD and HF.  She had called earlier this week with concerns that she had not received her prescription from her mail order pharmacy, but reports she has since received it.  GERD: Doing well with Protonix 40 mg once daily.  New  complaints: Patient reports a spot on the back of her scalp that is bothering her.   Social history:  Relevant past medical, surgical, family and social history reviewed and updated as indicated. Interim medical history since our last visit reviewed.  Allergies and medications reviewed and updated.  DATA REVIEWED: CHART IN EPIC  ROS: Negative unless specifically indicated above in HPI.    Current Outpatient Medications:  .  albuterol (PROVENTIL) (2.5 MG/3ML) 0.083% nebulizer solution, Take 3 mLs (2.5 mg total) by nebulization every 6 (six) hours as needed for wheezing or shortness of breath., Disp: 150 mL, Rfl: 1 .  albuterol (VENTOLIN HFA) 108 (90 Base) MCG/ACT inhaler, Inhale 2 puffs into the lungs every 6 (six) hours as needed for wheezing or shortness of breath., Disp: 1 each, Rfl: 0 .  Alcohol Swabs (B-D SINGLE USE SWABS REGULAR) PADS, Test sugars daily, Disp: 100 each, Rfl: 3 .  aspirin EC 81 MG tablet, Take 1 tablet (81 mg total) by mouth daily., Disp: , Rfl:  .  atorvastatin (LIPITOR) 40 MG tablet, Take 1 tablet by mouth every day, Disp: 30 tablet, Rfl: 0 .  cetirizine (ZYRTEC) 10 MG tablet, Take 1 tablet by mouth every day, Disp: 30 tablet, Rfl: 5 .  cholecalciferol (VITAMIN D3) 25 MCG (1000 UT) tablet, Take 1,000 Units by mouth 2 (two) times daily. , Disp: , Rfl:  .  dapagliflozin propanediol (FARXIGA) 10 MG TABS tablet, Take 1 tablet (10 mg total) by mouth daily before breakfast., Disp: 90 tablet, Rfl: 6 .  fluticasone (FLONASE) 50 MCG/ACT nasal spray, Use 2 sprays into each nostril every day, Disp: 16 g, Rfl: 5 .  furosemide (LASIX) 20 MG tablet, Take 1 tablet by mouth every day as needed, Disp: 30 tablet, Rfl: 2 .  gabapentin (NEURONTIN) 300 MG capsule, Take 1 capsule in morning & 2 caps at bedtime, Disp: 90 capsule, Rfl: 0 .  glucose blood (ACCU-CHEK AVIVA PLUS) test strip, Test sugars daily, Disp: 100 each, Rfl: 3 .  lisinopril (ZESTRIL) 10 MG tablet, Take 1 tablet by mouth  every day, Disp: 30 tablet, Rfl: 2 .  methocarbamol (ROBAXIN) 500 MG tablet, Take 1 tablet (500 mg total) by mouth 3 (three) times daily as needed for muscle spasms., Disp: 30 tablet, Rfl: 2 .  Multiple Vitamin (MULTIVITAMIN WITH MINERALS) TABS tablet, Take 1 tablet by mouth daily., Disp: , Rfl:  .  nitroGLYCERIN (NITROSTAT) 0.4 MG SL tablet, Place 1 tablet (0.4 mg total) under the tongue every 5 (five) minutes as needed for chest pain., Disp: 20 tablet, Rfl: 3 .  nystatin cream (MYCOSTATIN), Apply 1 application topically twice daily, Disp: 30 g, Rfl: 11 .  Omega-3 Fatty Acids (FISH OIL PO), Take 1 capsule by mouth daily. , Disp: , Rfl:  .  pantoprazole (PROTONIX) 40 MG tablet, Take 1 tablet (40 mg total) by mouth 2 (two) times daily. (NEEDS TO BE  SEEN BEFORE NEXT REFILL), Disp: 60 tablet, Rfl: 0 .  potassium chloride SA (K-DUR) 20 MEQ tablet, Take 20 mEq by mouth daily as needed (with lasix)., Disp: , Rfl:  .  pramipexole (MIRAPEX) 0.5 MG tablet, Take 1 tablet (0.5 mg total) by mouth at bedtime., Disp: 30 tablet, Rfl: 0 .  sertraline (ZOLOFT) 100 MG tablet, Take 1.5 tablets by mouth every day, Disp: 135 tablet, Rfl: 1   Allergies  Allergen Reactions  . Penicillins Itching and Rash    Has patient had a PCN reaction causing immediate rash, facial/tongue/throat swelling, SOB or lightheadedness with hypotension: no Has patient had a PCN reaction causing severe rash involving mucus membranes or skin necrosis: no Has patient had a PCN reaction that required hospitalization: no Has patient had a PCN reaction occurring within the last 10 years: no If all of the above answers are "NO", then may proceed with Cephalosporin use.   . Sulfa Antibiotics Rash   Past Medical History:  Diagnosis Date  . Anxiety   . Asthma   . Chest pain 12/22/2015  . CHF (congestive heart failure) (HCC)   . COPD (chronic obstructive pulmonary disease) (HCC)   . Depression   . Headache   . HTN (hypertension)   .  Hypokalemia 07/28/2013  . Kidney stones   . Osteopenia 01/22/2020  . Parkinson's disease   . Pneumonia   . Pre-diabetes   . S/P colonoscopy 10/15/04   normal    Past Surgical History:  Procedure Laterality Date  . CHOLECYSTECTOMY    . COLONOSCOPY  10/15/2004   Normal rectum/Normal colon  . COLONOSCOPY N/A 05/10/2016   Procedure: COLONOSCOPY;  Surgeon: West BaliSandi L Fields, MD;  Location: [facility];  Service: Endoscopy;  Laterality: N/A;  10:30  . ESOPHAGEAL MANOMETRY N/A 04/02/2019   Procedure: ESOPHAGEAL MANOMETRY (EM);  Surgeon: Napoleon FormNandigam, Kavitha V, MD;  Location: [facility];  Service: Endoscopy;  Laterality: N/A;  . ESOPHAGOGASTRODUODENOSCOPY  09/11/2010   Dysphagia likely multifactorial (possible Candida esophagitis, likely nonspecific esophageal motility disorder, and/or uncontrolled gastroesophageal reflux disease), status post empiric dilation  . ESOPHAGOGASTRODUODENOSCOPY N/A 11/13/2018   Dr. Darrick PennaFields: No endoscopic esophageal abnormality to explain patient's dysphagia.  Esophagus dilated.  Moderate erosive/nodular gastritis with benign biopsies.  Marland Kitchen. KIDNEY STONE SURGERY    . SAVORY DILATION N/A 11/13/2018   Procedure: SAVORY DILATION;  Surgeon: West BaliFields, Sandi L, MD;  Location: [facility];  Service: Endoscopy;  Laterality: N/A;    Social History   Socioeconomic History  . Marital status: Widowed    Spouse name: Not on file  . Number of children: 2  . Years of education: Not on file  . Highest education level: Not on file  Occupational History  . Occupation: disabled  Tobacco Use  . Smoking status: Former Smoker    Packs/day: 0.25    Years: 30.00    Pack years: 7.50    Types: Cigarettes    Start date: 01/04/1965    Quit date: 01/03/1995    Years since quitting: 25.7  . Smokeless tobacco: Never Used  Vaping Use  . Vaping Use: Never used  Substance and Sexual Activity  . Alcohol use: No    Alcohol/week: 0.0 standard drinks  . Drug use: No  . Sexual activity: Not on file   Other Topics Concern  . Not on file  Social History Narrative  . Not on file   Social Determinants of Health   Financial Resource Strain: Medium Risk  . Difficulty of  Paying Living Expenses: Somewhat hard  Food Insecurity: Food Insecurity Present  . Worried About Programme researcher, broadcasting/film/video in the Last Year: Sometimes true  . Ran Out of Food in the Last Year: Never true  Transportation Needs: No Transportation Needs  . Lack of Transportation (Medical): No  . Lack of Transportation (Non-Medical): No  Physical Activity: Not on file  Stress: Not on file  Social Connections: Moderately Integrated  . Frequency of Communication with Friends and Family: More than three times a week  . Frequency of Social Gatherings with Friends and Family: More than three times a week  . Attends Religious Services: More than 4 times per year  . Active Member of Clubs or Organizations: Yes  . Attends Banker Meetings: More than 4 times per year  . Marital Status: Widowed  Intimate Partner Violence: Not on file        Objective:    BP 130/90   Pulse 71   Temp 98.2 F (36.8 C) (Temporal)   Ht 5\' 6"  (1.676 m)   Wt 232 lb 12.8 oz (105.6 kg)   SpO2 90%   BMI 37.57 kg/m   Wt Readings from Last 3 Encounters:  09/10/20 232 lb 12.8 oz (105.6 kg)  06/17/20 232 lb 6.4 oz (105.4 kg)  03/14/20 223 lb (101.2 kg)    Physical Exam Vitals reviewed.  Constitutional:      General: She is not in acute distress.    Appearance: Normal appearance. She is morbidly obese. She is not ill-appearing, toxic-appearing or diaphoretic.  HENT:     Head: Normocephalic and atraumatic.  Eyes:     General: No scleral icterus.       Right eye: No discharge.        Left eye: No discharge.     Conjunctiva/sclera: Conjunctivae normal.  Cardiovascular:     Rate and Rhythm: Normal rate and regular rhythm.     Heart sounds: Normal heart sounds. No murmur heard. No friction rub. No gallop.   Pulmonary:     Effort:  Pulmonary effort is normal. No respiratory distress.     Breath sounds: Normal breath sounds. No stridor. No wheezing, rhonchi or rales.  Musculoskeletal:        General: Normal range of motion.     Cervical back: Normal range of motion.  Skin:    General: Skin is warm and dry.     Capillary Refill: Capillary refill takes less than 2 seconds.     Comments: Psoriasis patch at the base of her scalp.   Neurological:     General: No focal deficit present.     Mental Status: She is alert and oriented to person, place, and time. Mental status is at baseline.  Psychiatric:        Mood and Affect: Mood normal.        Behavior: Behavior normal.        Thought Content: Thought content normal.        Judgment: Judgment normal.     Lab Results  Component Value Date   TSH 2.420 06/17/2020   Lab Results  Component Value Date   WBC 6.7 06/17/2020   HGB 14.2 06/17/2020   HCT 43.1 06/17/2020   MCV 89 06/17/2020   PLT 205 06/17/2020   Lab Results  Component Value Date   NA 144 06/17/2020   K 4.3 06/17/2020   CO2 27 06/17/2020   GLUCOSE 96 06/17/2020   BUN 12 06/17/2020  CREATININE 0.83 06/17/2020   BILITOT 0.4 06/17/2020   ALKPHOS 96 06/17/2020   AST 24 06/17/2020   ALT 16 06/17/2020   PROT 6.1 06/17/2020   ALBUMIN 4.1 06/17/2020   CALCIUM 9.7 06/17/2020   ANIONGAP 13 07/17/2019   Lab Results  Component Value Date   CHOL 179 03/18/2020   Lab Results  Component Value Date   HDL 53 03/18/2020   Lab Results  Component Value Date   LDLCALC 103 (H) 03/18/2020   Lab Results  Component Value Date   TRIG 133 03/18/2020   Lab Results  Component Value Date   CHOLHDL 3.4 03/18/2020   Lab Results  Component Value Date   HGBA1C 5.7 06/17/2020

## 2020-09-12 ENCOUNTER — Telehealth: Payer: Medicare Other | Admitting: *Deleted

## 2020-09-12 ENCOUNTER — Telehealth: Payer: Self-pay | Admitting: *Deleted

## 2020-09-12 NOTE — Telephone Encounter (Signed)
  Care Management   Follow Up Note   09/12/2020 Name: Allison Rollins MRN: [id number] DOB: 1950-08-19   Referred by: Gwenlyn Fudge, FNP Reason for referral : Chronic Care Management (Unsuccessful outreach)   An unsuccessful telephone outreach was attempted today. The patient was referred to the case management team for assistance with care management and care coordination.   Follow Up Plan:  Forwarding to Care Guides to outreach for rescheduling  Demetrios Loll, BSN, RN-BC Embedded Chronic Care Manager [facility] Care Management Direct Dial: [phone]

## 2020-09-17 ENCOUNTER — Ambulatory Visit: Payer: Medicare Other | Admitting: Cardiology

## 2020-09-25 ENCOUNTER — Ambulatory Visit: Payer: Medicare Other | Admitting: Family Medicine

## 2020-09-26 ENCOUNTER — Encounter: Payer: Self-pay | Admitting: Family Medicine

## 2020-09-26 ENCOUNTER — Ambulatory Visit (INDEPENDENT_AMBULATORY_CARE_PROVIDER_SITE_OTHER): Payer: Medicare Other | Admitting: Family Medicine

## 2020-09-26 ENCOUNTER — Other Ambulatory Visit: Payer: Self-pay

## 2020-09-26 VITALS — BP 128/68 | HR 73 | Ht 66.0 in | Wt 237.0 lb

## 2020-09-26 DIAGNOSIS — R001 Bradycardia, unspecified: Secondary | ICD-10-CM | POA: Diagnosis not present

## 2020-09-26 DIAGNOSIS — I10 Essential (primary) hypertension: Secondary | ICD-10-CM

## 2020-09-26 DIAGNOSIS — I5032 Chronic diastolic (congestive) heart failure: Secondary | ICD-10-CM | POA: Diagnosis not present

## 2020-09-26 DIAGNOSIS — E782 Mixed hyperlipidemia: Secondary | ICD-10-CM

## 2020-09-26 NOTE — Progress Notes (Signed)
Cardiology Office Note  Date: 09/26/2020   ID: Allison Rollins, DOB 1950/06/27, MRN [id number]  PCP:  Gwenlyn Fudge, FNP  Cardiologist:  Dina Rich, MD Electrophysiologist:  None   Chief Complaint: 62-month follow-up  History of Present Illness: Allison Rollins is a 70 y.o. female with a history of chronic diastolic heart failure, GERD, stage III kidney disease, COPD, HTN, hypokalemia, prediabetes, chest pain, Parkinson's disease . Last seen by Dr. Wyline Mood on 03/10/2020 for chronic diastolic heart failure, hypertension, COPD, bradycardia, hyperlipidemia.  Her chronic diastolic heart failure was overall overall controlled and she was continuing diuretics.  Blood pressure was essentially at goal she was continuing current medications.  She continued with chronic sinus bradycardia that was asymptomatic.  Plan was to continue to monitor.  She was consistent continuing statin medication and lipid levels will were at goal.  She is here for 35-month follow-up today.  She denies any recent issues.  No DOE or SOB, no lower extremity edema, no palpitations or arrhythmias, orthostatic symptoms, CVA or TIA-like symptoms, PND, orthopnea.  Denies any anginal or exertional symptoms.  Blood pressure is well controlled at 128/68.  Denies any bleeding issues, claudication-like symptoms, DVT or PE-like symptoms.  She states she had a prolonged hospital stay at [facility] for swallowing problems, severely reduced appetite, weight loss.  She was diagnosed with achalasia.  She underwent perioral endoscopic myotomy.  She states since this was performed she has had no other issues.     Past Medical History:  Diagnosis Date  . Anxiety   . Asthma   . Chest pain 12/22/2015  . CHF (congestive heart failure) (HCC)   . COPD (chronic obstructive pulmonary disease) (HCC)   . Depression   . Headache   . HTN (hypertension)   . Hypokalemia 07/28/2013  . Kidney stones   . Osteopenia 01/22/2020  .  Parkinson's disease   . Pneumonia   . Pre-diabetes   . S/P colonoscopy 10/15/04   normal    Past Surgical History:  Procedure Laterality Date  . CHOLECYSTECTOMY    . COLONOSCOPY  10/15/2004   Normal rectum/Normal colon  . COLONOSCOPY N/A 05/10/2016   Procedure: COLONOSCOPY;  Surgeon: West Bali, MD;  Location: [facility];  Service: Endoscopy;  Laterality: N/A;  10:30  . ESOPHAGEAL MANOMETRY N/A 04/02/2019   Procedure: ESOPHAGEAL MANOMETRY (EM);  Surgeon: Napoleon Form, MD;  Location: [facility];  Service: Endoscopy;  Laterality: N/A;  . ESOPHAGOGASTRODUODENOSCOPY  09/11/2010   Dysphagia likely multifactorial (possible Candida esophagitis, likely nonspecific esophageal motility disorder, and/or uncontrolled gastroesophageal reflux disease), status post empiric dilation  . ESOPHAGOGASTRODUODENOSCOPY N/A 11/13/2018   Dr. Darrick Penna: No endoscopic esophageal abnormality to explain patient's dysphagia.  Esophagus dilated.  Moderate erosive/nodular gastritis with benign biopsies.  Marland Kitchen KIDNEY STONE SURGERY    . SAVORY DILATION N/A 11/13/2018   Procedure: SAVORY DILATION;  Surgeon: West Bali, MD;  Location: [facility];  Service: Endoscopy;  Laterality: N/A;    Current Outpatient Medications  Medication Sig Dispense Refill  . albuterol (PROVENTIL) (2.5 MG/3ML) 0.083% nebulizer solution Take 3 mLs (2.5 mg total) by nebulization every 6 (six) hours as needed for wheezing or shortness of breath. 150 mL 1  . albuterol (VENTOLIN HFA) 108 (90 Base) MCG/ACT inhaler Inhale 2 puffs into the lungs every 6 (six) hours as needed for wheezing or shortness of breath. 1 each 0  . Alcohol Swabs (B-D SINGLE USE SWABS REGULAR) PADS Test  sugars daily 100 each 3  . aspirin EC 81 MG tablet Take 1 tablet (81 mg total) by mouth daily.    Marland Kitchen atorvastatin (LIPITOR) 40 MG tablet Take 1 tablet by mouth every day 30 tablet 0  . cetirizine (ZYRTEC) 10 MG tablet Take 1 tablet by mouth every day 30 tablet 5  .  cholecalciferol (VITAMIN D3) 25 MCG (1000 UT) tablet Take 1,000 Units by mouth 2 (two) times daily.     . clobetasol (TEMOVATE) 0.05 % external solution Apply topically onto dry scalp once daily in a thin film to affected areas only; leave in place 15 minutes before lathering and rinsing. Use x2 weeks. 50 mL 0  . dapagliflozin propanediol (FARXIGA) 10 MG TABS tablet Take 1 tablet (10 mg total) by mouth daily before breakfast. 90 tablet 6  . fluticasone (FLONASE) 50 MCG/ACT nasal spray Use 2 sprays into each nostril every day 16 g 5  . furosemide (LASIX) 20 MG tablet Take 1 tablet by mouth every day as needed 30 tablet 2  . gabapentin (NEURONTIN) 300 MG capsule Take 1 capsule in morning & 2 caps at bedtime 90 capsule 0  . glucose blood (ACCU-CHEK AVIVA PLUS) test strip Test sugars daily 100 each 3  . lisinopril (ZESTRIL) 10 MG tablet Take 1 tablet by mouth every day 30 tablet 2  . methocarbamol (ROBAXIN) 500 MG tablet Take 1 tablet (500 mg total) by mouth 3 (three) times daily as needed for muscle spasms. 30 tablet 2  . Multiple Vitamin (MULTIVITAMIN WITH MINERALS) TABS tablet Take 1 tablet by mouth daily.    . nitroGLYCERIN (NITROSTAT) 0.4 MG SL tablet Place 1 tablet (0.4 mg total) under the tongue every 5 (five) minutes as needed for chest pain. 20 tablet 3  . nystatin cream (MYCOSTATIN) Apply 1 application topically twice daily 30 g 11  . Omega-3 Fatty Acids (FISH OIL PO) Take 1 capsule by mouth daily.     . pantoprazole (PROTONIX) 40 MG tablet Take 1 tablet (40 mg total) by mouth 2 (two) times daily. 180 tablet 1  . potassium chloride SA (K-DUR) 20 MEQ tablet Take 20 mEq by mouth daily as needed (with lasix).    . pramipexole (MIRAPEX) 0.5 MG tablet Take 1 tablet (0.5 mg total) by mouth at bedtime. 90 tablet 1  . sertraline (ZOLOFT) 100 MG tablet Take 1.5 tablets by mouth every day 135 tablet 1   No current facility-administered medications for this visit.   Allergies:  Penicillins and Sulfa  antibiotics   Social History: The patient  reports that she quit smoking about 25 years ago. Her smoking use included cigarettes. She started smoking about 55 years ago. She has a 7.50 pack-year smoking history. She has never used smokeless tobacco. She reports that she does not drink alcohol and does not use drugs.   Family History: The patient's family history includes Asthma in her brother; COPD in her mother; Coronary artery disease in her brother, father, sister, and sister; Diabetes in her sister.   ROS:  Please see the history of present illness. Otherwise, complete review of systems is positive for none.  All other systems are reviewed and negative.   Physical Exam: VS:  BP 128/68   Pulse 73   Ht 5\' 6"  (1.676 m)   Wt 237 lb (107.5 kg)   SpO2 97%   BMI 38.25 kg/m , BMI Body mass index is 38.25 kg/m.  Wt Readings from Last 3 Encounters:  09/26/20 237 lb (  107.5 kg)  09/10/20 232 lb 12.8 oz (105.6 kg)  06/17/20 232 lb 6.4 oz (105.4 kg)    General: Morbidly obese patient appears comfortable at rest. Neck: Supple, no elevated JVP or carotid bruits, no thyromegaly. Lungs: Clear to auscultation, nonlabored breathing at rest. Cardiac: Regular rate and rhythm, no S3 or significant systolic murmur, no pericardial rub. Extremities: No pitting edema, distal pulses 2+. Skin: Warm and dry. Musculoskeletal: No kyphosis. Neuropsychiatric: Alert and oriented x3, affect grossly appropriate.  ECG:  An ECG dated 09/26/2020 was personally reviewed today and demonstrated:  Normal sinus rhythm with sinus arrhythmia rate of 71  Recent Labwork: 06/17/2020: ALT 16; AST 24; BUN 12; Creatinine, Ser 0.83; Hemoglobin 14.2; Platelets 205; Potassium 4.3; Sodium 144; TSH 2.420     Component Value Date/Time   CHOL 179 03/18/2020 1200   TRIG 133 03/18/2020 1200   HDL 53 03/18/2020 1200   CHOLHDL 3.4 03/18/2020 1200   CHOLHDL 6.9 09/19/2014 0830   VLDL 49 (H) 09/19/2014 0830   LDLCALC 103 (H) 03/18/2020  1200    Other Studies Reviewed Today:  11/2013 Echo Study Conclusions  - Procedure narrative: Transthoracic echocardiography. Image quality was suboptimal. The study was technically difficult, as a result of poor sound wave transmission and body habitus. - Left ventricle: The cavity size was normal. Wall thickness was increased in a pattern of mild LVH. Systolic function was low normal. The estimated ejection fraction was approximately 50%. Images were inadequate for LV wall motion assessment, but no gross regional variation was noted. Features are consistent with a pseudonormal left ventricular filling pattern, with concomitant abnormal relaxation and increased filling pressure (grade 2 diastolic dysfunction). Doppler parameters are consistent with both elevated ventricular end-diastolic filling pressure and elevated left atrial filling pressure. - Mitral valve: Mildly calcified annulus. Mildly thickened leaflets . There was mild regurgitation. - Left atrium: The atrium was moderately to severely dilated.  11/2015 Nuclear stress  There was no ST segment deviation noted during stress.  No T wave inversion was noted during stress.  Findings consistent with prior myocardial infarction.  This is a low risk study.  The left ventricular ejection fraction is mildly decreased (45-54%).  Small inferolateral wall infarct at mid and basal level EF estimated 50% but looks normal no ischemia   Assessment and Plan:  1. Chronic diastolic heart failure (HCC)   2. Essential hypertension   3. Bradycardia   4. Mixed hyperlipidemia    1. Chronic diastolic heart failure (HCC) Denies any DOE or SOB.  Had some weight gain recently she is up about 4 pounds since prior measurement on 09/10/2020 when she weighed 232.  Today she weighs 237.  Continue furosemide 20 mg p.o. as needed with potassium supplementation continue lisinopril 10 mg daily.  2. Essential hypertension Blood pressure  well controlled today at 128/68.  Continue lisinopril 10 mg daily.  3. Bradycardia EKG today shows heart rate of 71 normal sinus rhythm with sinus arrhythmia.  4. Mixed hyperlipidemia Continue atorvastatin 40 mg daily.  Patient states she has an upcoming appointment with her primary care provider and will have labs at that time.  Medication Adjustments/Labs and Tests Ordered: Current medicines are reviewed at length with the patient today.  Concerns regarding medicines are outlined above.   Disposition: Follow-up with Dr. Wyline Mood or APP 6 months  Signed, Rennis Harding, NP 09/26/2020 3:56 PM  [facility] [address] [place], [place], [place] [address] Phone: [phone]; Fax: [phone]

## 2020-09-26 NOTE — Patient Instructions (Addendum)

## 2020-09-29 ENCOUNTER — Other Ambulatory Visit: Payer: Self-pay | Admitting: Family Medicine

## 2020-09-29 DIAGNOSIS — E1169 Type 2 diabetes mellitus with other specified complication: Secondary | ICD-10-CM

## 2020-09-29 DIAGNOSIS — J301 Allergic rhinitis due to pollen: Secondary | ICD-10-CM

## 2020-09-29 DIAGNOSIS — G629 Polyneuropathy, unspecified: Secondary | ICD-10-CM

## 2020-09-29 DIAGNOSIS — E785 Hyperlipidemia, unspecified: Secondary | ICD-10-CM

## 2020-09-30 ENCOUNTER — Telehealth: Payer: Medicare Other | Admitting: *Deleted

## 2020-09-30 ENCOUNTER — Telehealth: Payer: Self-pay | Admitting: *Deleted

## 2020-10-01 NOTE — Telephone Encounter (Signed)
  Care Management   Follow Up Note   10/01/2020 Name: Allison Rollins MRN: [id number] DOB: 1950/10/12   Referred by: Gwenlyn Fudge, FNP Reason for referral : Chronic Care Management (Unsuccessful CCM Outreach)   Third unsuccessful telephone outreach was attempted today. The patient was referred to the case management team for assistance with care management and care coordination. The patient's primary care provider has been notified of our unsuccessful attempts to make or maintain contact with the patient. The care management team is pleased to engage with this patient at any time in the future should he/she be interested in assistance from the care management team.   Follow Up Plan: A HIPPA compliant phone message was left for the patient providing contact information and requesting a return call.    Demetrios Loll, BSN, RN-BC Embedded Chronic Care Manager [facility] / [facility] Care Management Direct Dial: [phone]

## 2020-10-13 ENCOUNTER — Other Ambulatory Visit: Payer: Self-pay | Admitting: Family Medicine

## 2020-10-13 DIAGNOSIS — B372 Candidiasis of skin and nail: Secondary | ICD-10-CM

## 2020-10-18 ENCOUNTER — Other Ambulatory Visit: Payer: Self-pay | Admitting: Family Medicine

## 2020-10-18 DIAGNOSIS — Z1231 Encounter for screening mammogram for malignant neoplasm of breast: Secondary | ICD-10-CM

## 2020-10-29 ENCOUNTER — Other Ambulatory Visit: Payer: Self-pay | Admitting: Family Medicine

## 2020-10-29 DIAGNOSIS — G8929 Other chronic pain: Secondary | ICD-10-CM

## 2020-10-29 DIAGNOSIS — E1169 Type 2 diabetes mellitus with other specified complication: Secondary | ICD-10-CM

## 2020-10-29 DIAGNOSIS — G629 Polyneuropathy, unspecified: Secondary | ICD-10-CM

## 2020-11-03 ENCOUNTER — Telehealth: Payer: Self-pay | Admitting: Family Medicine

## 2020-11-03 NOTE — Telephone Encounter (Signed)
Advised we have not received results from them.

## 2020-11-13 ENCOUNTER — Ambulatory Visit (INDEPENDENT_AMBULATORY_CARE_PROVIDER_SITE_OTHER): Payer: Medicare Other | Admitting: Family Medicine

## 2020-11-13 DIAGNOSIS — K2100 Gastro-esophageal reflux disease with esophagitis, without bleeding: Secondary | ICD-10-CM | POA: Diagnosis not present

## 2020-11-13 DIAGNOSIS — B379 Candidiasis, unspecified: Secondary | ICD-10-CM | POA: Diagnosis not present

## 2020-11-13 MED ORDER — FAMOTIDINE 20 MG PO TABS: 20.0000 mg | ORAL_TABLET | Freq: Every day | ORAL | 2 refills | Status: DC

## 2020-11-13 MED ORDER — FLUCONAZOLE 150 MG PO TABS: 150.0000 mg | ORAL_TABLET | Freq: Once | ORAL | 0 refills | Status: AC

## 2020-11-13 NOTE — Progress Notes (Signed)
Virtual Visit via Telephone Note  I connected with Allison Rollins on 11/13/20 at 2:42 PM by telephone and verified that I am speaking with the correct person using two identifiers. Allison Rollins is currently located at home and nobody is currently with her during this visit. The provider, Gwenlyn Fudge, FNP is located in their office at time of visit.  I discussed the limitations, risks, security and privacy concerns of performing an evaluation and management service by telephone and the availability of in person appointments. I also discussed with the patient that there may be a patient responsible charge related to this service. The patient expressed understanding and agreed to proceed.  Subjective: PCP: Gwenlyn Fudge, FNP  Chief Complaint  Patient presents with   Vaginitis   Patient reports irritation on her labia that burns when she urinates and itches. States it started when she started [place]. She does use Aloe wet wipes when she goes to the bathroom.   Also reports acid reflux at night. She is on Protonix 40 mg twice daily.   ROS: Per HPI  Current Outpatient Medications:    albuterol (PROVENTIL) (2.5 MG/3ML) 0.083% nebulizer solution, Take 3 mLs (2.5 mg total) by nebulization every 6 (six) hours as needed for wheezing or shortness of breath., Disp: 150 mL, Rfl: 1   albuterol (VENTOLIN HFA) 108 (90 Base) MCG/ACT inhaler, Inhale 2 puffs into the lungs every 6 (six) hours as needed for wheezing or shortness of breath., Disp: 1 each, Rfl: 0   Alcohol Swabs (B-D SINGLE USE SWABS REGULAR) PADS, Test sugars daily, Disp: 100 each, Rfl: 3   aspirin EC 81 MG tablet, Take 1 tablet (81 mg total) by mouth daily., Disp: , Rfl:    atorvastatin (LIPITOR) 40 MG tablet, Take 1 tablet by mouth every day, Disp: 30 tablet, Rfl: 11   cetirizine (ZYRTEC) 10 MG tablet, Take 1 tablet by mouth every day, Disp: 30 tablet, Rfl: 5   cholecalciferol (VITAMIN D3) 25 MCG (1000 UT) tablet, Take 1,000 Units  by mouth 2 (two) times daily. , Disp: , Rfl:    clobetasol (TEMOVATE) 0.05 % external solution, Apply topically onto dry scalp once daily in a thin film to affected areas only; leave in place 15 minutes before lathering and rinsing. Use x2 weeks., Disp: 50 mL, Rfl: 0   dapagliflozin propanediol (FARXIGA) 10 MG TABS tablet, Take 1 tablet (10 mg total) by mouth daily before breakfast., Disp: 90 tablet, Rfl: 6   fluticasone (FLONASE) 50 MCG/ACT nasal spray, Use 2 sprays into each nostril every day, Disp: 16 g, Rfl: 2   furosemide (LASIX) 20 MG tablet, Take 1 tablet by mouth every day as needed, Disp: 30 tablet, Rfl: 2   gabapentin (NEURONTIN) 300 MG capsule, Take 1 capsule in morning & 2 caps at bedtime, Disp: 90 capsule, Rfl: 11   glucose blood (ACCU-CHEK AVIVA PLUS) test strip, Test sugars daily, Disp: 100 each, Rfl: 3   lisinopril (ZESTRIL) 10 MG tablet, Take 1 tablet by mouth every day, Disp: 30 tablet, Rfl: 2   methocarbamol (ROBAXIN) 500 MG tablet, Take 1 tablet by mouth 3 times a day as needed for muscle spasms, Disp: 30 tablet, Rfl: 11   Multiple Vitamin (MULTIVITAMIN WITH MINERALS) TABS tablet, Take 1 tablet by mouth daily., Disp: , Rfl:    nitroGLYCERIN (NITROSTAT) 0.4 MG SL tablet, Place 1 tablet (0.4 mg total) under the tongue every 5 (five) minutes as needed for chest pain., Disp: 20  tablet, Rfl: 3   nystatin cream (MYCOSTATIN), Apply 1 application topically twice daily, Disp: 30 g, Rfl: 2   Omega-3 Fatty Acids (FISH OIL PO), Take 1 capsule by mouth daily. , Disp: , Rfl:    pantoprazole (PROTONIX) 40 MG tablet, Take 1 tablet (40 mg total) by mouth 2 (two) times daily., Disp: 180 tablet, Rfl: 1   potassium chloride SA (K-DUR) 20 MEQ tablet, Take 20 mEq by mouth daily as needed (with lasix)., Disp: , Rfl:    pramipexole (MIRAPEX) 0.5 MG tablet, Take 1 tablet (0.5 mg total) by mouth at bedtime., Disp: 90 tablet, Rfl: 1   sertraline (ZOLOFT) 100 MG tablet, Take 1.5 tablets by mouth every day,  Disp: 135 tablet, Rfl: 1  Allergies  Allergen Reactions   Penicillins Itching and Rash    Has patient had a PCN reaction causing immediate rash, facial/tongue/throat swelling, SOB or lightheadedness with hypotension: no Has patient had a PCN reaction causing severe rash involving mucus membranes or skin necrosis: no Has patient had a PCN reaction that required hospitalization: no Has patient had a PCN reaction occurring within the last 10 years: no If all of the above answers are "NO", then may proceed with Cephalosporin use.    Sulfa Antibiotics Rash   Past Medical History:  Diagnosis Date   Anxiety    Asthma    Chest pain 12/22/2015   CHF (congestive heart failure) (HCC)    COPD (chronic obstructive pulmonary disease) (HCC)    Depression    Headache    HTN (hypertension)    Hypokalemia 07/28/2013   Kidney stones    Osteopenia 01/22/2020   Parkinson's disease    Pneumonia    Pre-diabetes    S/P colonoscopy 10/15/04   normal    Observations/Objective: A&O  No respiratory distress or wheezing audible over the phone Mood, judgement, and thought processes all WNL   Assessment and Plan: 1. Yeast infection Continue using wet wipes and drinking plenty of water. - fluconazole (DIFLUCAN) 150 MG tablet; Take 1 tablet (150 mg total) by mouth once for 1 dose. May repeat after 3 days if needed.  Dispense: 2 tablet; Refill: 0  2. Gastroesophageal reflux disease with esophagitis, unspecified whether hemorrhage Add famotidine at bedtime. - famotidine (PEPCID) 20 MG tablet; Take 1 tablet (20 mg total) by mouth at bedtime.  Dispense: 30 tablet; Refill: 2   Follow Up Instructions:  I discussed the assessment and treatment plan with the patient. The patient was provided an opportunity to ask questions and all were answered. The patient agreed with the plan and demonstrated an understanding of the instructions.   The patient was advised to call back or seek an in-person evaluation if the  symptoms worsen or if the condition fails to improve as anticipated.  The above assessment and management plan was discussed with the patient. The patient verbalized understanding of and has agreed to the management plan. Patient is aware to call the clinic if symptoms persist or worsen. Patient is aware when to return to the clinic for a follow-up visit. Patient educated on when it is appropriate to go to the emergency department.   Time call ended: 2:53 PM  I provided 11 minutes of non-face-to-face time during this encounter.  [name], MSN, APRN, FNP-C [facility] 11/13/20

## 2020-11-14 DIAGNOSIS — R079 Chest pain, unspecified: Secondary | ICD-10-CM | POA: Diagnosis not present

## 2020-11-14 DIAGNOSIS — R0789 Other chest pain: Secondary | ICD-10-CM | POA: Diagnosis not present

## 2020-11-14 DIAGNOSIS — R55 Syncope and collapse: Secondary | ICD-10-CM | POA: Diagnosis not present

## 2020-12-11 ENCOUNTER — Encounter: Payer: Medicare Other | Admitting: Family Medicine

## 2020-12-11 ENCOUNTER — Other Ambulatory Visit: Payer: Self-pay

## 2020-12-12 NOTE — Progress Notes (Signed)
Patient rescheduled

## 2020-12-15 ENCOUNTER — Encounter: Payer: Medicare Other | Admitting: Family Medicine

## 2021-01-01 ENCOUNTER — Encounter: Payer: Self-pay | Admitting: Family Medicine

## 2021-01-01 ENCOUNTER — Other Ambulatory Visit: Payer: Self-pay

## 2021-01-01 ENCOUNTER — Ambulatory Visit (INDEPENDENT_AMBULATORY_CARE_PROVIDER_SITE_OTHER): Payer: Medicare Other | Admitting: Family Medicine

## 2021-01-01 VITALS — BP 116/60 | HR 50 | Temp 97.8°F | Ht 66.0 in | Wt 231.2 lb

## 2021-01-01 DIAGNOSIS — F411 Generalized anxiety disorder: Secondary | ICD-10-CM

## 2021-01-01 DIAGNOSIS — R7303 Prediabetes: Secondary | ICD-10-CM | POA: Diagnosis not present

## 2021-01-01 DIAGNOSIS — N1831 Chronic kidney disease, stage 3a: Secondary | ICD-10-CM | POA: Diagnosis not present

## 2021-01-01 DIAGNOSIS — Z0001 Encounter for general adult medical examination with abnormal findings: Secondary | ICD-10-CM | POA: Diagnosis not present

## 2021-01-01 DIAGNOSIS — R062 Wheezing: Secondary | ICD-10-CM | POA: Diagnosis not present

## 2021-01-01 DIAGNOSIS — E782 Mixed hyperlipidemia: Secondary | ICD-10-CM | POA: Diagnosis not present

## 2021-01-01 DIAGNOSIS — Z0000 Encounter for general adult medical examination without abnormal findings: Secondary | ICD-10-CM

## 2021-01-01 DIAGNOSIS — I5032 Chronic diastolic (congestive) heart failure: Secondary | ICD-10-CM | POA: Diagnosis not present

## 2021-01-01 DIAGNOSIS — K2100 Gastro-esophageal reflux disease with esophagitis, without bleeding: Secondary | ICD-10-CM | POA: Diagnosis not present

## 2021-01-01 DIAGNOSIS — G2581 Restless legs syndrome: Secondary | ICD-10-CM | POA: Diagnosis not present

## 2021-01-01 DIAGNOSIS — J302 Other seasonal allergic rhinitis: Secondary | ICD-10-CM

## 2021-01-01 DIAGNOSIS — F339 Major depressive disorder, recurrent, unspecified: Secondary | ICD-10-CM

## 2021-01-01 DIAGNOSIS — E785 Hyperlipidemia, unspecified: Secondary | ICD-10-CM

## 2021-01-01 DIAGNOSIS — E1169 Type 2 diabetes mellitus with other specified complication: Secondary | ICD-10-CM

## 2021-01-01 DIAGNOSIS — I10 Essential (primary) hypertension: Secondary | ICD-10-CM | POA: Diagnosis not present

## 2021-01-01 LAB — BAYER DCA HB A1C WAIVED: HB A1C (BAYER DCA - WAIVED): 5.7 % — ABNORMAL HIGH (ref 4.8–5.6)

## 2021-01-01 MED ORDER — PRAMIPEXOLE DIHYDROCHLORIDE 0.5 MG PO TABS: 0.5000 mg | ORAL_TABLET | Freq: Every day | ORAL | 1 refills | Status: DC

## 2021-01-01 MED ORDER — FAMOTIDINE 20 MG PO TABS: 20.0000 mg | ORAL_TABLET | Freq: Every day | ORAL | 1 refills | Status: DC

## 2021-01-01 MED ORDER — SERTRALINE HCL 100 MG PO TABS: ORAL_TABLET | ORAL | 1 refills | Status: DC

## 2021-01-01 MED ORDER — ATORVASTATIN CALCIUM 40 MG PO TABS: 40.0000 mg | ORAL_TABLET | Freq: Every day | ORAL | 1 refills | Status: DC

## 2021-01-01 MED ORDER — FUROSEMIDE 20 MG PO TABS: 20.0000 mg | ORAL_TABLET | Freq: Every day | ORAL | 1 refills | Status: DC | PRN

## 2021-01-01 NOTE — Progress Notes (Signed)
Assessment & Plan:  1. Well adult exam - preventative health information provided - advanced directive information and forms provided - encouraged to schedule flu shot soon - encouraged to schedule mammogram soon - Lipid panel - CBC with Differential/Platelet - CMP14+EGFR  2. Essential hypertension - Well controlled on current regimen - continue antihypertensives as prescribed - continue healthy diet and exercise - Lipid panel - CBC with Differential/Platelet - CMP14+EGFR  3. Hyperlipidemia associated with type 2 diabetes mellitus ([place]) - Well controlled on current regimen - continue healthy diet and exercise - continue Lipitor as prescribed - Lipid panel - CMP14+EGFR - atorvastatin (LIPITOR) 40 MG tablet; Take 1 tablet (40 mg total) by mouth daily.  Dispense: 90 tablet; Refill: 1  4. Chronic diastolic heart failure (HCC) - Well controlled on current regimen - continue - Lipid panel - CBC with Differential/Platelet - CMP14+EGFR - furosemide (LASIX) 20 MG tablet; Take 1 tablet (20 mg total) by mouth daily as needed.  Dispense: 90 tablet; Refill: 1  5. Prediabetes - continue healthy diet and exercise - Bayer DCA Hb A1c Waived, 5.8 today  6. Recurrent depression ([place]) - Well controlled on current regimen - CMP14+EGFR - sertraline (ZOLOFT) 100 MG tablet; Take 1.5 tablets by mouth every day  Dispense: 135 tablet; Refill: 1  7. Generalized anxiety disorder - Well controlled on current regimen - CMP14+EGFR - sertraline (ZOLOFT) 100 MG tablet; Take 1.5 tablets by mouth every day  Dispense: 135 tablet; Refill: 1  8. Gastroesophageal reflux disease with esophagitis, unspecified whether hemorrhage - Well controlled on current regimen - CMP14+EGFR - famotidine (PEPCID) 20 MG tablet; Take 1 tablet (20 mg total) by mouth at bedtime.  Dispense: 90 tablet; Refill: 1  9. Restless leg - Well controlled on current regimen - continue Mirapex as prescribed - CMP14+EGFR -  pramipexole (MIRAPEX) 0.5 MG tablet; Take 1 tablet (0.5 mg total) by mouth at bedtime.  Dispense: 90 tablet; Refill: 1  10. Stage 3a chronic kidney disease ([place]) - continue Farxiga as prescribed - CMP14+EGFR  11. Impacted cerumen - Encouraged to purchase Debrox wax removal kit over the counter. Drops (3-4) should be instilled twice daily x3 days, then the ear flushed with warm water on the 4th day.   12-13. Seasonal allergies/Wheezing - encouraged to use Zyrtec nightly.   Follow-up: Return in about 3 months (around 04/02/2021) for follow-up of chronic medication conditions.   Lucile Crater, NP Student  I personally was present during the history, physical exam, and medical decision-making activities of this service and have verified that the service and findings are accurately documented in the nurse practitioner student's note.  [name], MSN, APRN, FNP-C [facility]   Subjective:  Patient ID: [name], female    DOB: May 03, 1950  Age: 70 y.o. MRN: [id number]  Patient Care Team: Loman Brooklyn, FNP as PCP - General (Family Medicine) Harl Bowie, Alphonse Guild, MD as PCP - Cardiology (Cardiology) Danie Binder, MD (Inactive) (Gastroenterology) Phillips Odor, MD as Consulting Physician (Neurology) Harl Bowie Alphonse Guild, MD as Consulting Physician (Cardiology) Ilean China, RN as [place] Management   CC:  Chief Complaint  Patient presents with   Annual Exam    HPI AYONA YNIGUEZ presents for annual physical.   She is not checking her blood pressures at home due to her cuff breaking.   She states she wakes up most mornings wheezy and has to use her albuterol. She has not been taking her Zyrtec  daily. She uses Flonase daily for rhinitis.   Occupation: retired, Marital status: widowed, Substance use: no Diet: low cholesterol, low sugar, Exercise: some walking, uses a cane and walker, has a home ramp and shower chair Last eye  exam: 2 weeks ago with Dr. Wynetta Emery Last dental exam: wears dentures Last colonoscopy: 05/10/16 Last mammogram: due, will schedule DEXA: 01/22/20 Hepatitis C Screening: completed Immunizations: Flu Vaccine:  will get when available Tdap Vaccine: declined  Shingrix Vaccine: declined  COVID-19 Vaccine: up to date, declined 4th dose Pneumonia Vaccine: up to date  Advanced Directives Patient does not have advanced directives including DNR, living will, healthcare power of attorney, financial power of attorney, and MOST form. She does not have a copy in the electronic medical record. She is interested in information on establishing them.  DEPRESSION SCREENING PHQ 2/9 Scores 01/01/2021 09/10/2020 06/17/2020 03/14/2020 02/28/2020 01/22/2020 11/27/2019  PHQ - 2 Score 0 5 0 1 1 1  0  PHQ- 9 Score 3 17 4 4  - 5 -  Exception Documentation - - - - - - -  Not completed - - - - - - -     Review of Systems  Constitutional: Negative.  Negative for chills, fever, malaise/fatigue and weight loss.  HENT: Negative.  Negative for congestion, ear discharge, ear pain, hearing loss and sinus pain.   Eyes: Negative.  Negative for blurred vision, double vision, discharge and redness.  Respiratory:  Positive for shortness of breath and wheezing. Negative for cough, hemoptysis and sputum production.        In the mornings on awakening  Cardiovascular: Negative.  Negative for chest pain, palpitations and leg swelling.  Gastrointestinal:  Negative for abdominal pain, constipation, diarrhea, heartburn and nausea.  Genitourinary:  Negative for dysuria, frequency and urgency.  Musculoskeletal:  Positive for back pain. Negative for joint pain, myalgias and neck pain.  Skin: Negative.  Negative for itching and rash.  Neurological: Negative.  Negative for dizziness, weakness and headaches.  Endo/Heme/Allergies: Negative.   Psychiatric/Behavioral: Negative.      Current Outpatient Medications:    albuterol (PROVENTIL) (2.5  MG/3ML) 0.083% nebulizer solution, Take 3 mLs (2.5 mg total) by nebulization every 6 (six) hours as needed for wheezing or shortness of breath., Disp: 150 mL, Rfl: 1   albuterol (VENTOLIN HFA) 108 (90 Base) MCG/ACT inhaler, Inhale 2 puffs into the lungs every 6 (six) hours as needed for wheezing or shortness of breath., Disp: 1 each, Rfl: 0   Alcohol Swabs (B-D SINGLE USE SWABS REGULAR) PADS, Test sugars daily, Disp: 100 each, Rfl: 3   aspirin EC 81 MG tablet, Take 1 tablet (81 mg total) by mouth daily., Disp: , Rfl:    cetirizine (ZYRTEC) 10 MG tablet, Take 1 tablet by mouth every day, Disp: 30 tablet, Rfl: 5   cholecalciferol (VITAMIN D3) 25 MCG (1000 UT) tablet, Take 1,000 Units by mouth 2 (two) times daily. , Disp: , Rfl:    dapagliflozin propanediol (FARXIGA) 10 MG TABS tablet, Take 1 tablet (10 mg total) by mouth daily before breakfast., Disp: 90 tablet, Rfl: 6   fluticasone (FLONASE) 50 MCG/ACT nasal spray, Use 2 sprays into each nostril every day, Disp: 16 g, Rfl: 2   gabapentin (NEURONTIN) 300 MG capsule, Take 1 capsule in morning & 2 caps at bedtime, Disp: 90 capsule, Rfl: 11   glucose blood (ACCU-CHEK AVIVA PLUS) test strip, Test sugars daily, Disp: 100 each, Rfl: 3   lisinopril (ZESTRIL) 10 MG tablet, Take 1 tablet by  mouth every day, Disp: 30 tablet, Rfl: 2   methocarbamol (ROBAXIN) 500 MG tablet, Take 1 tablet by mouth 3 times a day as needed for muscle spasms, Disp: 30 tablet, Rfl: 11   Multiple Vitamin (MULTIVITAMIN WITH MINERALS) TABS tablet, Take 1 tablet by mouth daily., Disp: , Rfl:    nitroGLYCERIN (NITROSTAT) 0.4 MG SL tablet, Place 1 tablet (0.4 mg total) under the tongue every 5 (five) minutes as needed for chest pain., Disp: 20 tablet, Rfl: 3   Omega-3 Fatty Acids (FISH OIL PO), Take 1 capsule by mouth daily. , Disp: , Rfl:    pantoprazole (PROTONIX) 40 MG tablet, Take 1 tablet (40 mg total) by mouth 2 (two) times daily., Disp: 180 tablet, Rfl: 1   potassium chloride SA  (K-DUR) 20 MEQ tablet, Take 20 mEq by mouth daily as needed (with lasix)., Disp: , Rfl:    atorvastatin (LIPITOR) 40 MG tablet, Take 1 tablet (40 mg total) by mouth daily., Disp: 90 tablet, Rfl: 1   famotidine (PEPCID) 20 MG tablet, Take 1 tablet (20 mg total) by mouth at bedtime., Disp: 90 tablet, Rfl: 1   furosemide (LASIX) 20 MG tablet, Take 1 tablet (20 mg total) by mouth daily as needed., Disp: 90 tablet, Rfl: 1   pramipexole (MIRAPEX) 0.5 MG tablet, Take 1 tablet (0.5 mg total) by mouth at bedtime., Disp: 90 tablet, Rfl: 1   sertraline (ZOLOFT) 100 MG tablet, Take 1.5 tablets by mouth every day, Disp: 135 tablet, Rfl: 1  Allergies  Allergen Reactions   Penicillins Itching and Rash    Has patient had a PCN reaction causing immediate rash, facial/tongue/throat swelling, SOB or lightheadedness with hypotension: no Has patient had a PCN reaction causing severe rash involving mucus membranes or skin necrosis: no Has patient had a PCN reaction that required hospitalization: no Has patient had a PCN reaction occurring within the last 10 years: no If all of the above answers are "NO", then may proceed with Cephalosporin use.    Sulfa Antibiotics Rash    Past Medical History:  Diagnosis Date   Anxiety    Asthma    Chest pain 12/22/2015   CHF (congestive heart failure) (HCC)    COPD (chronic obstructive pulmonary disease) (HCC)    Depression    Headache    HTN (hypertension)    Hypokalemia 07/28/2013   Kidney stones    Osteopenia 01/22/2020   Parkinson's disease    Pneumonia    Pre-diabetes    S/P colonoscopy 10/15/04   normal    Past Surgical History:  Procedure Laterality Date   CHOLECYSTECTOMY     COLONOSCOPY  10/15/2004   Normal rectum/Normal colon   COLONOSCOPY N/A 05/10/2016   Procedure: COLONOSCOPY;  Surgeon: Danie Binder, MD;  Location: [facility];  Service: Endoscopy;  Laterality: N/A;  10:30   ESOPHAGEAL MANOMETRY N/A 04/02/2019   Procedure: ESOPHAGEAL MANOMETRY  (EM);  Surgeon: Mauri Pole, MD;  Location: [facility];  Service: Endoscopy;  Laterality: N/A;   ESOPHAGOGASTRODUODENOSCOPY  09/11/2010   Dysphagia likely multifactorial (possible Candida esophagitis, likely nonspecific esophageal motility disorder, and/or uncontrolled gastroesophageal reflux disease), status post empiric dilation   ESOPHAGOGASTRODUODENOSCOPY N/A 11/13/2018   Dr. Oneida Alar: No endoscopic esophageal abnormality to explain patient's dysphagia.  Esophagus dilated.  Moderate erosive/nodular gastritis with benign biopsies.   KIDNEY STONE SURGERY     SAVORY DILATION N/A 11/13/2018   Procedure: SAVORY DILATION;  Surgeon: Danie Binder, MD;  Location: [facility];  Service: Endoscopy;  Laterality: N/A;    Family History  Problem Relation Age of Onset   Coronary artery disease Father    COPD Mother    Asthma Brother    Coronary artery disease Sister    Diabetes Sister    Coronary artery disease Brother    Coronary artery disease Sister     Social History   Socioeconomic History   Marital status: Widowed    Spouse name: Not on file   Number of children: 2   Years of education: Not on file   Highest education level: Not on file  Occupational History   Occupation: disabled  Tobacco Use   Smoking status: Former    Packs/day: 0.25    Years: 30.00    Pack years: 7.50    Types: Cigarettes    Start date: 01/04/1965    Quit date: 01/03/1995    Years since quitting: 26.0   Smokeless tobacco: Never  Vaping Use   Vaping Use: Never used  Substance and Sexual Activity   Alcohol use: No    Alcohol/week: 0.0 standard drinks   Drug use: No   Sexual activity: Not on file  Other Topics Concern   Not on file  Social History Narrative   Not on file   Social Determinants of Health   Financial Resource Strain: Medium Risk   Difficulty of Paying Living Expenses: Somewhat hard  Food Insecurity: Food Insecurity Present   Worried About Charity fundraiser in the Last  Year: Sometimes true   Ran Out of Food in the Last Year: Never true  Transportation Needs: No Transportation Needs   Lack of Transportation (Medical): No   Lack of Transportation (Non-Medical): No  Physical Activity: Not on file  Stress: Not on file  Social Connections: Moderately Integrated   Frequency of Communication with Friends and Family: More than three times a week   Frequency of Social Gatherings with Friends and Family: More than three times a week   Attends Religious Services: More than 4 times per year   Active Member of [employer] or Organizations: Yes   Attends Archivist Meetings: More than 4 times per year   Marital Status: Widowed  Intimate Partner Violence: Not on file      Objective:    BP 116/60   Pulse (!) 50   Temp 97.8 F (36.6 C) (Temporal)   Ht 5' 6"  (1.676 m)   Wt 104.9 kg   SpO2 90%   BMI 37.32 kg/m   Wt Readings from Last 3 Encounters:  01/01/21 104.9 kg  09/26/20 107.5 kg  09/10/20 105.6 kg    Physical Exam Vitals reviewed.  Constitutional:      General: She is not in acute distress.    Appearance: Normal appearance. She is obese. She is not ill-appearing, toxic-appearing or diaphoretic.  HENT:     Head: Normocephalic and atraumatic.     Right Ear: External ear normal. There is impacted cerumen.     Left Ear: External ear normal. There is impacted cerumen.     Nose: Nose normal. No congestion or rhinorrhea.     Mouth/Throat:     Mouth: Mucous membranes are moist.     Pharynx: Oropharynx is clear. No oropharyngeal exudate or posterior oropharyngeal erythema.  Eyes:     Extraocular Movements: Extraocular movements intact.     Pupils: Pupils are equal, round, and reactive to light.  Cardiovascular:     Rate and Rhythm: Normal rate and  regular rhythm.     Pulses: Normal pulses.     Heart sounds: Normal heart sounds.  Pulmonary:     Effort: Pulmonary effort is normal. No respiratory distress.     Breath sounds: Normal breath  sounds. No wheezing or rhonchi.  Chest:     Chest wall: No tenderness.  Abdominal:     General: Bowel sounds are normal. There is no distension.     Palpations: Abdomen is soft. There is no mass.     Tenderness: There is no abdominal tenderness.     Hernia: No hernia is present.  Musculoskeletal:        General: Normal range of motion.     Cervical back: Normal range of motion.     Thoracic back: Scoliosis present.  Skin:    General: Skin is warm and dry.     Capillary Refill: Capillary refill takes less than 2 seconds.  Neurological:     General: No focal deficit present.     Mental Status: She is alert and oriented to person, place, and time. Mental status is at baseline.     Motor: No weakness.     Coordination: Coordination normal.     Gait: Gait abnormal (very slow, walks with cane).  Psychiatric:        Mood and Affect: Mood normal.        Behavior: Behavior normal.        Thought Content: Thought content normal.        Judgment: Judgment normal.    Lab Results  Component Value Date   TSH 2.420 06/17/2020   Lab Results  Component Value Date   WBC 6.7 06/17/2020   HGB 14.2 06/17/2020   HCT 43.1 06/17/2020   MCV 89 06/17/2020   PLT 205 06/17/2020   Lab Results  Component Value Date   NA 144 06/17/2020   K 4.3 06/17/2020   CO2 27 06/17/2020   GLUCOSE 96 06/17/2020   BUN 12 06/17/2020   CREATININE 0.83 06/17/2020   BILITOT 0.4 06/17/2020   ALKPHOS 96 06/17/2020   AST 24 06/17/2020   ALT 16 06/17/2020   PROT 6.1 06/17/2020   ALBUMIN 4.1 06/17/2020   CALCIUM 9.7 06/17/2020   ANIONGAP 13 07/17/2019   Lab Results  Component Value Date   CHOL 179 03/18/2020   Lab Results  Component Value Date   HDL 53 03/18/2020   Lab Results  Component Value Date   LDLCALC 103 (H) 03/18/2020   Lab Results  Component Value Date   TRIG 133 03/18/2020   Lab Results  Component Value Date   CHOLHDL 3.4 03/18/2020   Lab Results  Component Value Date   HGBA1C 5.7  06/17/2020

## 2021-01-01 NOTE — Patient Instructions (Addendum)
Reminder: please return after mid-September for your flu shot. If you receive this elsewhere, such as your pharmacy, please let us know so we can get this documented in your chart. We have flu clinic days scheduled during the month of October, if you would like to go ahead and schedule for this.   Purchase Debrox wax removal kit over the counter. Drops (3-4) should be instilled twice daily x3 days, then the ear flushed with warm water on the 4th day.   Take Zyrtec EVERY night.

## 2021-01-02 LAB — LIPID PANEL
Chol/HDL Ratio: 4 ratio (ref 0.0–4.4)
Cholesterol, Total: 170 mg/dL (ref 100–199)
HDL: 42 mg/dL (ref >39)
LDL Chol Calc (NIH): 98 mg/dL (ref 0–99)
Triglycerides: 171 mg/dL — ABNORMAL HIGH (ref 0–149)
VLDL Cholesterol Cal: 30 mg/dL (ref 5–40)

## 2021-01-02 LAB — CBC WITH DIFFERENTIAL/PLATELET
Basophils Absolute: 0 10*3/uL (ref 0.0–0.2)
Basos: 0 %
EOS (ABSOLUTE): 0.1 10*3/uL (ref 0.0–0.4)
Eos: 2 %
Hematocrit: 46.7 % — ABNORMAL HIGH (ref 34.0–46.6)
Hemoglobin: 15.7 g/dL (ref 11.1–15.9)
Immature Grans (Abs): 0 10*3/uL (ref 0.0–0.1)
Immature Granulocytes: 0 %
Lymphocytes Absolute: 1.2 10*3/uL (ref 0.7–3.1)
Lymphs: 15 %
MCH: 29.8 pg (ref 26.6–33.0)
MCHC: 33.6 g/dL (ref 31.5–35.7)
MCV: 89 fL (ref 79–97)
Monocytes Absolute: 0.5 10*3/uL (ref 0.1–0.9)
Monocytes: 6 %
Neutrophils Absolute: 5.8 10*3/uL (ref 1.4–7.0)
Neutrophils: 77 %
Platelets: 214 10*3/uL (ref 150–450)
RBC: 5.26 x10E6/uL (ref 3.77–5.28)
RDW: 13.2 % (ref 11.7–15.4)
WBC: 7.6 10*3/uL (ref 3.4–10.8)

## 2021-01-02 LAB — CMP14+EGFR
ALT: 12 IU/L (ref 0–32)
AST: 19 IU/L (ref 0–40)
Albumin/Globulin Ratio: 1.9 (ref 1.2–2.2)
Albumin: 4.2 g/dL (ref 3.8–4.8)
Alkaline Phosphatase: 96 IU/L (ref 44–121)
BUN/Creatinine Ratio: 13 (ref 12–28)
BUN: 14 mg/dL (ref 8–27)
Bilirubin Total: 0.6 mg/dL (ref 0.0–1.2)
CO2: 27 mmol/L (ref 20–29)
Calcium: 9.4 mg/dL (ref 8.7–10.3)
Chloride: 105 mmol/L (ref 96–106)
Creatinine, Ser: 1.06 mg/dL — ABNORMAL HIGH (ref 0.57–1.00)
Globulin, Total: 2.2 g/dL (ref 1.5–4.5)
Glucose: 108 mg/dL — ABNORMAL HIGH (ref 65–99)
Potassium: 4.6 mmol/L (ref 3.5–5.2)
Sodium: 146 mmol/L — ABNORMAL HIGH (ref 134–144)
Total Protein: 6.4 g/dL (ref 6.0–8.5)
eGFR: 57 mL/min/{1.73_m2} — ABNORMAL LOW (ref >59)

## 2021-01-08 ENCOUNTER — Other Ambulatory Visit: Payer: Self-pay | Admitting: Nurse Practitioner

## 2021-01-08 ENCOUNTER — Other Ambulatory Visit: Payer: Self-pay | Admitting: Family Medicine

## 2021-01-08 DIAGNOSIS — J301 Allergic rhinitis due to pollen: Secondary | ICD-10-CM

## 2021-01-08 DIAGNOSIS — I10 Essential (primary) hypertension: Secondary | ICD-10-CM

## 2021-01-21 ENCOUNTER — Ambulatory Visit (INDEPENDENT_AMBULATORY_CARE_PROVIDER_SITE_OTHER): Payer: Medicare Other | Admitting: Nurse Practitioner

## 2021-01-21 ENCOUNTER — Other Ambulatory Visit: Payer: Self-pay

## 2021-01-21 ENCOUNTER — Encounter: Payer: Self-pay | Admitting: Nurse Practitioner

## 2021-01-21 VITALS — BP 143/85 | HR 63 | Temp 97.8°F | Resp 20 | Ht 66.0 in | Wt 234.0 lb

## 2021-01-21 DIAGNOSIS — R609 Edema, unspecified: Secondary | ICD-10-CM

## 2021-01-21 DIAGNOSIS — Z23 Encounter for immunization: Secondary | ICD-10-CM

## 2021-01-21 NOTE — Patient Instructions (Signed)
Edema  Edema is when you have too much fluid in your body or under your skin. Edema may make your legs, feet, and ankles swell up. Swelling is also common in looser tissues, like around your eyes. This is a common condition. It gets more common as you get older. There are many possible causes of edema. Eating too much salt (sodium) and being on your feet or sitting for a long time can cause edema in yourlegs, feet, and ankles. Hot weather may make edema worse. Edema is usually painless. Your skin may look swollen or shiny. Follow these instructions at home: Keep the swollen body part raised (elevated) above the level of your heart when you are sitting or lying down. Do not sit still or stand for a long time. Do not wear tight clothes. Do not wear garters on your upper legs. Exercise your legs. This can help the swelling go down. Wear elastic bandages or support stockings as told by your doctor. Eat a low-salt (low-sodium) diet to reduce fluid as told by your doctor. Depending on the cause of your swelling, you may need to limit how much fluid you drink (fluid restriction). Take over-the-counter and prescription medicines only as told by your doctor. Contact a doctor if: Treatment is not working. You have heart, liver, or kidney disease and have symptoms of edema. You have sudden and unexplained weight gain. Get help right away if: You have shortness of breath or chest pain. You cannot breathe when you lie down. You have pain, redness, or warmth in the swollen areas. You have heart, liver, or kidney disease and get edema all of a sudden. You have a fever and your symptoms get worse all of a sudden. Summary Edema is when you have too much fluid in your body or under your skin. Edema may make your legs, feet, and ankles swell up. Swelling is also common in looser tissues, like around your eyes. Raise (elevate) the swollen body part above the level of your heart when you are sitting or lying  down. Follow your doctor's instructions about diet and how much fluid you can drink (fluid restriction). This information is not intended to replace advice given to you by your health care provider. Make sure you discuss any questions you have with your healthcare provider. Document Revised: 02/06/2020 Document Reviewed: 02/06/2020 Elsevier Patient Education  2022 [employer]  

## 2021-01-21 NOTE — Assessment & Plan Note (Signed)
-  Compression socks,  -Elevate feet above heart -monitor oxygen saturation for shortness of breath Follow up with unresolved /worsening symptoms.

## 2021-01-21 NOTE — Progress Notes (Signed)
Acute Office Visit  Subjective:    Patient ID: Allison Rollins, female    DOB: Jan 28, 1951, 70 y.o.   MRN: [id number]  Chief Complaint  Patient presents with   ? poor circulation in legs    HPI Patient is in today for  Edema: Patient complains of edema. The location of the edema is lower leg(s) bilateral.  The edema has been moderate.  Onset of symptoms was a few months ago, unchanged since that time. The edema is present all day. The patient states the problem is long-standing.  The swelling has been aggravated by nothing, relieved by diuretics, and been associated with weight gain. Cardiac risk factors include advanced age (older than 7 for men, 56 for women) and obesity (BMI >= 30 kg/m2).   Past Medical History:  Diagnosis Date   Anxiety    Asthma    Chest pain 12/22/2015   CHF (congestive heart failure) (HCC)    COPD (chronic obstructive pulmonary disease) (HCC)    Depression    Headache    HTN (hypertension)    Hypokalemia 07/28/2013   Kidney stones    Osteopenia 01/22/2020   Parkinson's disease    Pneumonia    Pre-diabetes    S/P colonoscopy 10/15/04   normal    Past Surgical History:  Procedure Laterality Date   CHOLECYSTECTOMY     COLONOSCOPY  10/15/2004   Normal rectum/Normal colon   COLONOSCOPY N/A 05/10/2016   Procedure: COLONOSCOPY;  Surgeon: Danie Binder, MD;  Location: [facility];  Service: Endoscopy;  Laterality: N/A;  10:30   ESOPHAGEAL MANOMETRY N/A 04/02/2019   Procedure: ESOPHAGEAL MANOMETRY (EM);  Surgeon: Mauri Pole, MD;  Location: [facility];  Service: Endoscopy;  Laterality: N/A;   ESOPHAGOGASTRODUODENOSCOPY  09/11/2010   Dysphagia likely multifactorial (possible Candida esophagitis, likely nonspecific esophageal motility disorder, and/or uncontrolled gastroesophageal reflux disease), status post empiric dilation   ESOPHAGOGASTRODUODENOSCOPY N/A 11/13/2018   Dr. Oneida Alar: No endoscopic esophageal abnormality to explain patient's dysphagia.   Esophagus dilated.  Moderate erosive/nodular gastritis with benign biopsies.   KIDNEY STONE SURGERY     SAVORY DILATION N/A 11/13/2018   Procedure: SAVORY DILATION;  Surgeon: Danie Binder, MD;  Location: [facility];  Service: Endoscopy;  Laterality: N/A;    Family History  Problem Relation Age of Onset   Coronary artery disease Father    COPD Mother    Asthma Brother    Coronary artery disease Sister    Diabetes Sister    Coronary artery disease Brother    Coronary artery disease Sister     Social History   Socioeconomic History   Marital status: Widowed    Spouse name: Not on file   Number of children: 2   Years of education: Not on file   Highest education level: Not on file  Occupational History   Occupation: disabled  Tobacco Use   Smoking status: Former    Packs/day: 0.25    Years: 30.00    Pack years: 7.50    Types: Cigarettes    Start date: 01/04/1965    Quit date: 01/03/1995    Years since quitting: 26.0   Smokeless tobacco: Never  Vaping Use   Vaping Use: Never used  Substance and Sexual Activity   Alcohol use: No    Alcohol/week: 0.0 standard drinks   Drug use: No   Sexual activity: Not on file  Other Topics Concern   Not on file  Social History Narrative  Not on file   Social Determinants of Health   Financial Resource Strain: Medium Risk   Difficulty of Paying Living Expenses: Somewhat hard  Food Insecurity: Food Insecurity Present   Worried About Running Out of Food in the Last Year: Sometimes true   Ran Out of Food in the Last Year: Never true  Transportation Needs: No Transportation Needs   Lack of Transportation (Medical): No   Lack of Transportation (Non-Medical): No  Physical Activity: Not on file  Stress: Not on file  Social Connections: Moderately Integrated   Frequency of Communication with Friends and Family: More than three times a week   Frequency of Social Gatherings with Friends and Family: More than three times a week    Attends Religious Services: More than 4 times per year   Active Member of [employer] or Organizations: Yes   Attends Archivist Meetings: More than 4 times per year   Marital Status: Widowed  Intimate Partner Violence: Not on file    Outpatient Medications Prior to Visit  Medication Sig Dispense Refill   albuterol (PROVENTIL) (2.5 MG/3ML) 0.083% nebulizer solution Take 3 mLs (2.5 mg total) by nebulization every 6 (six) hours as needed for wheezing or shortness of breath. 150 mL 1   albuterol (VENTOLIN HFA) 108 (90 Base) MCG/ACT inhaler Inhale 2 puffs into the lungs every 6 (six) hours as needed for wheezing or shortness of breath. 1 each 0   Alcohol Swabs (B-D SINGLE USE SWABS REGULAR) PADS Test sugars daily 100 each 3   aspirin EC 81 MG tablet Take 1 tablet (81 mg total) by mouth daily.     atorvastatin (LIPITOR) 40 MG tablet Take 1 tablet (40 mg total) by mouth daily. 90 tablet 1   cetirizine (ZYRTEC) 10 MG tablet Take 1 tablet by mouth every day 30 tablet 5   cholecalciferol (VITAMIN D3) 25 MCG (1000 UT) tablet Take 1,000 Units by mouth 2 (two) times daily.      dapagliflozin propanediol (FARXIGA) 10 MG TABS tablet Take 1 tablet (10 mg total) by mouth daily before breakfast. 90 tablet 6   famotidine (PEPCID) 20 MG tablet Take 1 tablet (20 mg total) by mouth at bedtime. 90 tablet 1   fluticasone (FLONASE) 50 MCG/ACT nasal spray Use 2 sprays into each nostril every day 16 g 5   furosemide (LASIX) 20 MG tablet Take 1 tablet (20 mg total) by mouth daily as needed. 90 tablet 1   gabapentin (NEURONTIN) 300 MG capsule Take 1 capsule in morning & 2 caps at bedtime 90 capsule 11   glucose blood (ACCU-CHEK AVIVA PLUS) test strip Test sugars daily 100 each 3   lisinopril (ZESTRIL) 10 MG tablet Take 1 tablet by mouth every day 30 tablet 2   methocarbamol (ROBAXIN) 500 MG tablet Take 1 tablet by mouth 3 times a day as needed for muscle spasms 30 tablet 11   Multiple Vitamin (MULTIVITAMIN WITH  MINERALS) TABS tablet Take 1 tablet by mouth daily.     nitroGLYCERIN (NITROSTAT) 0.4 MG SL tablet Place 1 tablet (0.4 mg total) under the tongue every 5 (five) minutes as needed for chest pain. 20 tablet 3   Omega-3 Fatty Acids (FISH OIL PO) Take 1 capsule by mouth daily.      pantoprazole (PROTONIX) 40 MG tablet Take 1 tablet (40 mg total) by mouth 2 (two) times daily. 180 tablet 1   potassium chloride SA (K-DUR) 20 MEQ tablet Take 20 mEq by mouth daily as needed (  with lasix).     pramipexole (MIRAPEX) 0.5 MG tablet Take 1 tablet (0.5 mg total) by mouth at bedtime. 90 tablet 1   sertraline (ZOLOFT) 100 MG tablet Take 1.5 tablets by mouth every day 135 tablet 1   No facility-administered medications prior to visit.    Allergies  Allergen Reactions   Penicillins Itching and Rash    Has patient had a PCN reaction causing immediate rash, facial/tongue/throat swelling, SOB or lightheadedness with hypotension: no Has patient had a PCN reaction causing severe rash involving mucus membranes or skin necrosis: no Has patient had a PCN reaction that required hospitalization: no Has patient had a PCN reaction occurring within the last 10 years: no If all of the above answers are "NO", then may proceed with Cephalosporin use.    Sulfa Antibiotics Rash    Review of Systems  Constitutional: Negative.   HENT: Negative.    Eyes: Negative.   Cardiovascular:  Positive for leg swelling. Negative for chest pain and palpitations.  Skin:  Negative for rash.  All other systems reviewed and are negative.     Objective:    Physical Exam Vitals and nursing note reviewed.  Constitutional:      Appearance: Normal appearance. She is obese.  HENT:     Head: Normocephalic.     Right Ear: External ear normal.     Left Ear: External ear normal.     Nose: Nose normal.     Mouth/Throat:     Mouth: Mucous membranes are moist.     Pharynx: Oropharynx is clear.  Eyes:     Conjunctiva/sclera: Conjunctivae  normal.  Cardiovascular:     Rate and Rhythm: Normal rate and regular rhythm.  Pulmonary:     Effort: Pulmonary effort is normal.     Breath sounds: Normal breath sounds.  Abdominal:     General: Bowel sounds are normal.  Skin:    Findings: No rash.  Neurological:     Mental Status: She is alert and oriented to person, place, and time.  Psychiatric:        Behavior: Behavior normal.    BP (!) 143/85   Pulse 63   Temp 97.8 F (36.6 C) (Temporal)   Resp 20   Ht 5' 6" (1.676 m)   Wt 234 lb (106.1 kg)   SpO2 91%   BMI 37.77 kg/m  Wt Readings from Last 3 Encounters:  01/21/21 234 lb (106.1 kg)  01/01/21 231 lb 3.2 oz (104.9 kg)  09/26/20 237 lb (107.5 kg)    Health Maintenance Due  Topic Date Due   MAMMOGRAM  12/16/2017    There are no preventive care reminders to display for this patient.   Lab Results  Component Value Date   TSH 2.420 06/17/2020   Lab Results  Component Value Date   WBC 7.6 01/01/2021   HGB 15.7 01/01/2021   HCT 46.7 (H) 01/01/2021   MCV 89 01/01/2021   PLT 214 01/01/2021   Lab Results  Component Value Date   NA 146 (H) 01/01/2021   K 4.6 01/01/2021   CO2 27 01/01/2021   GLUCOSE 108 (H) 01/01/2021   BUN 14 01/01/2021   CREATININE 1.06 (H) 01/01/2021   BILITOT 0.6 01/01/2021   ALKPHOS 96 01/01/2021   AST 19 01/01/2021   ALT 12 01/01/2021   PROT 6.4 01/01/2021   ALBUMIN 4.2 01/01/2021   CALCIUM 9.4 01/01/2021   ANIONGAP 13 07/17/2019   EGFR 57 (L) 01/01/2021  Lab Results  Component Value Date   CHOL 170 01/01/2021   Lab Results  Component Value Date   HDL 42 01/01/2021   Lab Results  Component Value Date   LDLCALC 98 01/01/2021   Lab Results  Component Value Date   TRIG 171 (H) 01/01/2021   Lab Results  Component Value Date   CHOLHDL 4.0 01/01/2021   Lab Results  Component Value Date   HGBA1C 5.7 (H) 01/01/2021       Assessment & Plan:   Problem List Items Addressed This Visit       Other   Edema -  Primary    -Compression socks,  -Elevate feet above heart -monitor oxygen saturation for shortness of breath Follow up with unresolved /worsening symptoms.      Relevant Orders   For home use only DME Other see comment     No orders of the defined types were placed in this encounter.    Ivy Lynn, NP

## 2021-01-26 ENCOUNTER — Ambulatory Visit: Payer: Medicare Other

## 2021-03-02 ENCOUNTER — Ambulatory Visit: Admission: RE | Admit: 2021-03-02 | Payer: Medicare Other | Source: Ambulatory Visit

## 2021-03-02 ENCOUNTER — Ambulatory Visit (INDEPENDENT_AMBULATORY_CARE_PROVIDER_SITE_OTHER): Payer: Medicare Other

## 2021-03-02 VITALS — Ht 66.0 in | Wt 234.0 lb

## 2021-03-02 DIAGNOSIS — Z0000 Encounter for general adult medical examination without abnormal findings: Secondary | ICD-10-CM

## 2021-03-02 NOTE — Patient Instructions (Signed)
Allison Rollins , Thank you for taking time to come for your Medicare Wellness Visit. I appreciate your ongoing commitment to your health goals. Please review the following plan we discussed and let me know if I can assist you in the future.   Screening recommendations/referrals: Colonoscopy: Done 05/10/2016 - Repeat in 10 years  Mammogram: Appointment 04/22/2021 - Repeat annually  Bone Density: Done 01/22/2020 - Repeat every 2 years  Recommended yearly ophthalmology/optometry visit for glaucoma screening and checkup Recommended yearly dental visit for hygiene and checkup  Vaccinations: Influenza vaccine: Done 01/21/2021 - Repeat annually  Pneumococcal vaccine: Done 01/05/2015 & 02/09/2016 Tdap vaccine: Due. Every 10 years Shingles vaccine: Zostavax done 2016 -due for Shingrix   Covid-19: Done 09/29/2019, 10/29/2019, & 06/18/2020  Advanced directives: Please bring a copy of your health care power of attorney and living will to the office to be added to your chart at your convenience.   Conditions/risks identified: Aim for 30 minutes of exercise or brisk walking each day, drink 6-8 glasses of water and eat lots of fruits and vegetables.   Next appointment: Follow up in one year for your annual wellness visit    Preventive Care 65 Years and Older, Female Preventive care refers to lifestyle choices and visits with your health care provider that can promote health and wellness. What does preventive care include? A yearly physical exam. This is also called an annual well check. Dental exams once or twice a year. Routine eye exams. Ask your health care provider how often you should have your eyes checked. Personal lifestyle choices, including: Daily care of your teeth and gums. Regular physical activity. Eating a healthy diet. Avoiding tobacco and drug use. Limiting alcohol use. Practicing safe sex. Taking low-dose aspirin every day. Taking vitamin and mineral supplements as recommended by your  health care provider. What happens during an annual well check? The services and screenings done by your health care provider during your annual well check will depend on your age, overall health, lifestyle risk factors, and family history of disease. Counseling  Your health care provider may ask you questions about your: Alcohol use. Tobacco use. Drug use. Emotional well-being. Home and relationship well-being. Sexual activity. Eating habits. History of falls. Memory and ability to understand (cognition). Work and work Astronomer. Reproductive health. Screening  You may have the following tests or measurements: Height, weight, and BMI. Blood pressure. Lipid and cholesterol levels. These may be checked every 5 years, or more frequently if you are over 67 years old. Skin check. Lung cancer screening. You may have this screening every year starting at age 24 if you have a 30-pack-year history of smoking and currently smoke or have quit within the past 15 years. Fecal occult blood test (FOBT) of the stool. You may have this test every year starting at age 52. Flexible sigmoidoscopy or colonoscopy. You may have a sigmoidoscopy every 5 years or a colonoscopy every 10 years starting at age 37. Hepatitis C blood test. Hepatitis B blood test. Sexually transmitted disease (STD) testing. Diabetes screening. This is done by checking your blood sugar (glucose) after you have not eaten for a while (fasting). You may have this done every 1-3 years. Bone density scan. This is done to screen for osteoporosis. You may have this done starting at age 32. Mammogram. This may be done every 1-2 years. Talk to your health care provider about how often you should have regular mammograms. Talk with your health care provider about your test results, treatment  options, and if necessary, the need for more tests. Vaccines  Your health care provider may recommend certain vaccines, such as: Influenza vaccine.  This is recommended every year. Tetanus, diphtheria, and acellular pertussis (Tdap, Td) vaccine. You may need a Td booster every 10 years. Zoster vaccine. You may need this after age 56. Pneumococcal 13-valent conjugate (PCV13) vaccine. One dose is recommended after age 73. Pneumococcal polysaccharide (PPSV23) vaccine. One dose is recommended after age 1. Talk to your health care provider about which screenings and vaccines you need and how often you need them. This information is not intended to replace advice given to you by your health care provider. Make sure you discuss any questions you have with your health care provider. Document Released: 05/09/2015 Document Revised: 12/31/2015 Document Reviewed: 02/11/2015 Elsevier Interactive Patient Education  2017 [place] Prevention in the Home Falls can cause injuries. They can happen to people of all ages. There are many things you can do to make your home safe and to help prevent falls. What can I do on the outside of my home? Regularly fix the edges of walkways and driveways and fix any cracks. Remove anything that might make you trip as you walk through a door, such as a raised step or threshold. Trim any bushes or trees on the path to your home. Use bright outdoor lighting. Clear any walking paths of anything that might make someone trip, such as rocks or tools. Regularly check to see if handrails are loose or broken. Make sure that both sides of any steps have handrails. Any raised decks and porches should have guardrails on the edges. Have any leaves, snow, or ice cleared regularly. Use sand or salt on walking paths during winter. Clean up any spills in your garage right away. This includes oil or grease spills. What can I do in the bathroom? Use night lights. Install grab bars by the toilet and in the tub and shower. Do not use towel bars as grab bars. Use non-skid mats or decals in the tub or shower. If you need to sit  down in the shower, use a plastic, non-slip stool. Keep the floor dry. Clean up any water that spills on the floor as soon as it happens. Remove soap buildup in the tub or shower regularly. Attach bath mats securely with double-sided non-slip rug tape. Do not have throw rugs and other things on the floor that can make you trip. What can I do in the bedroom? Use night lights. Make sure that you have a light by your bed that is easy to reach. Do not use any sheets or blankets that are too big for your bed. They should not hang down onto the floor. Have a firm chair that has side arms. You can use this for support while you get dressed. Do not have throw rugs and other things on the floor that can make you trip. What can I do in the kitchen? Clean up any spills right away. Avoid walking on wet floors. Keep items that you use a lot in easy-to-reach places. If you need to reach something above you, use a strong step stool that has a grab bar. Keep electrical cords out of the way. Do not use floor polish or wax that makes floors slippery. If you must use wax, use non-skid floor wax. Do not have throw rugs and other things on the floor that can make you trip. What can I do with my stairs? Do not  leave any items on the stairs. Make sure that there are handrails on both sides of the stairs and use them. Fix handrails that are broken or loose. Make sure that handrails are as long as the stairways. Check any carpeting to make sure that it is firmly attached to the stairs. Fix any carpet that is loose or worn. Avoid having throw rugs at the top or bottom of the stairs. If you do have throw rugs, attach them to the floor with carpet tape. Make sure that you have a light switch at the top of the stairs and the bottom of the stairs. If you do not have them, ask someone to add them for you. What else can I do to help prevent falls? Wear shoes that: Do not have high heels. Have rubber bottoms. Are  comfortable and fit you well. Are closed at the toe. Do not wear sandals. If you use a stepladder: Make sure that it is fully opened. Do not climb a closed stepladder. Make sure that both sides of the stepladder are locked into place. Ask someone to hold it for you, if possible. Clearly mark and make sure that you can see: Any grab bars or handrails. First and last steps. Where the edge of each step is. Use tools that help you move around (mobility aids) if they are needed. These include: Canes. Walkers. Scooters. Crutches. Turn on the lights when you go into a dark area. Replace any light bulbs as soon as they burn out. Set up your furniture so you have a clear path. Avoid moving your furniture around. If any of your floors are uneven, fix them. If there are any pets around you, be aware of where they are. Review your medicines with your doctor. Some medicines can make you feel dizzy. This can increase your chance of falling. Ask your doctor what other things that you can do to help prevent falls. This information is not intended to replace advice given to you by your health care provider. Make sure you discuss any questions you have with your health care provider. Document Released: 02/06/2009 Document Revised: 09/18/2015 Document Reviewed: 05/17/2014 Elsevier Interactive Patient Education  2017 [employer].

## 2021-03-02 NOTE — Progress Notes (Signed)
Subjective:   Allison Rollins is a 70 y.o. female who presents for Medicare Annual (Subsequent) preventive examination.  Virtual Visit via Telephone Note  I connected with  [name] on 03/02/21 at  2:00 PM EST by telephone and verified that I am speaking with the correct person using two identifiers.  Location: Patient: Home Provider: [facility] Persons participating in the virtual visit: patient/Nurse Health Advisor   I discussed the limitations, risks, security and privacy concerns of performing an evaluation and management service by telephone and the availability of in person appointments. The patient expressed understanding and agreed to proceed.  Interactive audio and video telecommunications were attempted between this nurse and patient, however failed, due to patient having technical difficulties OR patient did not have access to video capability.  We continued and completed visit with audio only.  Some vital signs may be absent or patient reported.   Meka Lewan E Rylea Selway, LPN   Review of Systems     Cardiac Risk Factors include: advanced age (>28men, >39 women);obesity (BMI >30kg/m2);sedentary lifestyle;dyslipidemia;hypertension;Other (see comment), Risk factor comments: chronic diastolic heart failure     Objective:    Today's Vitals   03/02/21 1456  Weight: 234 lb (106.1 kg)  Height: 5\' 6"  (1.676 m)   Body mass index is 37.77 kg/m.  Advanced Directives 03/02/2021 06/26/2020 02/28/2020 11/21/2019 07/17/2019 02/22/2019 11/13/2018  Does Patient Have a Medical Advance Directive? No No No No No Yes No  Type of Advance Directive - - - - - Living will;Healthcare Power of Attorney -  Does patient want to make changes to medical advance directive? - - - - - No - Patient declined -  Copy of Healthcare Power of Attorney in Chart? - - - - - No - copy requested -  Would patient like information on creating a medical advance directive? No - Patient declined - No - Patient declined No -  Patient declined No - Patient declined - -  Pre-existing out of facility DNR order (yellow form or pink MOST form) - - - - - - -    Current Medications (verified) Outpatient Encounter Medications as of 03/02/2021  Medication Sig   albuterol (PROVENTIL) (2.5 MG/3ML) 0.083% nebulizer solution Take 3 mLs (2.5 mg total) by nebulization every 6 (six) hours as needed for wheezing or shortness of breath.   albuterol (VENTOLIN HFA) 108 (90 Base) MCG/ACT inhaler Inhale 2 puffs into the lungs every 6 (six) hours as needed for wheezing or shortness of breath.   aspirin EC 81 MG tablet Take 1 tablet (81 mg total) by mouth daily.   atorvastatin (LIPITOR) 40 MG tablet Take 1 tablet (40 mg total) by mouth daily.   cetirizine (ZYRTEC) 10 MG tablet Take 1 tablet by mouth every day   cholecalciferol (VITAMIN D3) 25 MCG (1000 UT) tablet Take 1,000 Units by mouth 2 (two) times daily.    dapagliflozin propanediol (FARXIGA) 10 MG TABS tablet Take 1 tablet (10 mg total) by mouth daily before breakfast.   famotidine (PEPCID) 20 MG tablet Take 1 tablet (20 mg total) by mouth at bedtime.   fluticasone (FLONASE) 50 MCG/ACT nasal spray Use 2 sprays into each nostril every day   furosemide (LASIX) 20 MG tablet Take 1 tablet (20 mg total) by mouth daily as needed.   gabapentin (NEURONTIN) 300 MG capsule Take 1 capsule in morning & 2 caps at bedtime   lisinopril (ZESTRIL) 10 MG tablet Take 1 tablet by mouth every day   methocarbamol (  ROBAXIN) 500 MG tablet Take 1 tablet by mouth 3 times a day as needed for muscle spasms   Multiple Vitamin (MULTIVITAMIN WITH MINERALS) TABS tablet Take 1 tablet by mouth daily.   nitroGLYCERIN (NITROSTAT) 0.4 MG SL tablet Place 1 tablet (0.4 mg total) under the tongue every 5 (five) minutes as needed for chest pain.   Omega-3 Fatty Acids (FISH OIL PO) Take 1 capsule by mouth daily.    pantoprazole (PROTONIX) 40 MG tablet Take 1 tablet (40 mg total) by mouth 2 (two) times daily.   potassium  chloride SA (K-DUR) 20 MEQ tablet Take 20 mEq by mouth daily as needed (with lasix).   pramipexole (MIRAPEX) 0.5 MG tablet Take 1 tablet (0.5 mg total) by mouth at bedtime.   sertraline (ZOLOFT) 100 MG tablet Take 1.5 tablets by mouth every day   Alcohol Swabs (B-D SINGLE USE SWABS REGULAR) PADS Test sugars daily   glucose blood (ACCU-CHEK AVIVA PLUS) test strip Test sugars daily   No facility-administered encounter medications on file as of 03/02/2021.    Allergies (verified) Penicillins and Sulfa antibiotics   History: Past Medical History:  Diagnosis Date   Anxiety    Asthma    Chest pain 12/22/2015   CHF (congestive heart failure) (HCC)    COPD (chronic obstructive pulmonary disease) (HCC)    Depression    Headache    HTN (hypertension)    Hypokalemia 07/28/2013   Kidney stones    Osteopenia 01/22/2020   Parkinson's disease    Pneumonia    Pre-diabetes    S/P colonoscopy 10/15/04   normal   Past Surgical History:  Procedure Laterality Date   CHOLECYSTECTOMY     COLONOSCOPY  10/15/2004   Normal rectum/Normal colon   COLONOSCOPY N/A 05/10/2016   Procedure: COLONOSCOPY;  Surgeon: West Bali, MD;  [facility]: [facility];  Service: Endoscopy;  Laterality: N/A;  10:30   ESOPHAGEAL MANOMETRY N/A 04/02/2019   Procedure: ESOPHAGEAL MANOMETRY (EM);  Surgeon: Napoleon Form, MD;  Location: [facility];  Service: Endoscopy;  Laterality: N/A;   ESOPHAGOGASTRODUODENOSCOPY  09/11/2010   Dysphagia likely multifactorial (possible Candida esophagitis, likely nonspecific esophageal motility disorder, and/or uncontrolled gastroesophageal reflux disease), status post empiric dilation   ESOPHAGOGASTRODUODENOSCOPY N/A 11/13/2018   Dr. Darrick Penna: No endoscopic esophageal abnormality to explain patient's dysphagia.  Esophagus dilated.  Moderate erosive/nodular gastritis with benign biopsies.   KIDNEY STONE SURGERY     SAVORY DILATION N/A 11/13/2018   Procedure: SAVORY DILATION;  Surgeon:  West Bali, MD;  Location: [facility];  Service: Endoscopy;  Laterality: N/A;   Family History  Problem Relation Age of Onset   Coronary artery disease Father    COPD Mother    Asthma Brother    Coronary artery disease Sister    Diabetes Sister    Coronary artery disease Brother    Coronary artery disease Sister    Social History   Socioeconomic History   Marital status: Widowed    Spouse name: Not on file   Number of children: 2   Years of education: Not on file   Highest education level: Not on file  Occupational History   Occupation: disabled  Tobacco Use   Smoking status: Former    Packs/day: 0.25    Years: 30.00    Pack years: 7.50    Types: Cigarettes    Start date: 01/04/1965    Quit date: 01/03/1995    Years since quitting: 26.1   Smokeless tobacco:  Never  Vaping Use   Vaping Use: Never used  Substance and Sexual Activity   Alcohol use: No    Alcohol/week: 0.0 standard drinks   Drug use: No   Sexual activity: Not on file  Other Topics Concern   Not on file  Social History Narrative   Lives alone. No stairs. Has ramps. Handicap bathroom   Social Determinants of Health   Financial Resource Strain: Low Risk    Difficulty of Paying Living Expenses: Not very hard  Food Insecurity: Food Insecurity Present   Worried About Running Out of Food in the Last Year: Sometimes true   Ran Out of Food in the Last Year: Never true  Transportation Needs: No Transportation Needs   Lack of Transportation (Medical): No   Lack of Transportation (Non-Medical): No  Physical Activity: Insufficiently Active   Days of Exercise per Week: 6 days   Minutes of Exercise per Session: 20 min  Stress: No Stress Concern Present   Feeling of Stress : Not at all  Social Connections: Moderately Integrated   Frequency of Communication with Friends and Family: More than three times a week   Frequency of Social Gatherings with Friends and Family: More than three times a week   Attends  Religious Services: More than 4 times per year   Active Member of [employer] or Organizations: Yes   Attends Banker Meetings: More than 4 times per year   Marital Status: Widowed    Tobacco Counseling Counseling given: Not Answered   Clinical Intake:  Pre-visit preparation completed: Yes  Pain : No/denies pain     BMI - recorded: 37.77 Nutritional Status: BMI > 30  Obese Nutritional Risks: None Diabetes: No  How often do you need to have someone help you when you read instructions, pamphlets, or other written materials from your doctor or pharmacy?: 1 - Never  Diabetic? borderline  Interpreter Needed?: No  Information entered by :: Dahiana Kulak, LPN   Activities of Daily Living In your present state of health, do you have any difficulty performing the following activities: 03/02/2021  Hearing? Y  Vision? N  Difficulty concentrating or making decisions? N  Walking or climbing stairs? Y  Dressing or bathing? N  Doing errands, shopping? N  Preparing Food and eating ? N  Using the Toilet? N  In the past six months, have you accidently leaked urine? Y  Comment wears pads for protection  Do you have problems with loss of bowel control? N  Managing your Medications? N  Managing your Finances? N  Housekeeping or managing your Housekeeping? N  Some recent data might be hidden    Patient Care Team: Gwenlyn Fudge, FNP as PCP - General (Family Medicine) Wyline Mood, Dorothe Pea, MD as PCP - Cardiology (Cardiology) West Bali, MD (Inactive) (Gastroenterology) Beryle Beams, MD as Consulting Physician (Neurology) Wyline Mood Dorothe Pea, MD as Consulting Physician (Cardiology) Gwenith Daily, RN as [facility] Care Management  Indicate any recent Medical Services you may have received from other than [facility] providers in the past year (date may be approximate).     Assessment:   This is a routine wellness examination for [facility].  Hearing/Vision  screen Hearing Screening - Comments:: C/o moderate hearing difficulties - no hearing aids Vision Screening - Comments:: Wears rx glasses - up to date with annual eye exams at [facility]  Dietary issues and exercise activities discussed: Current Exercise Habits: Home exercise routine, Type of exercise: walking, Time (Minutes):  20, Frequency (Times/Week): 6, Weekly Exercise (Minutes/Week): 120, Intensity: Mild, Exercise limited by: orthopedic condition(s);neurologic condition(s);cardiac condition(s)   Goals Addressed             This Visit's Progress    Exercise 3x per week (30 min per time)   On track    Exercise at the [place] [employer] 3 times per week for 30 minutes each session.     Prevent Diabetes   On track    Timeframe:  Long-Range Goal Priority:  Medium Start Date:  07/25/20                           Expected End Date:  07/25/21                     Follow-up: 09/12/20  Take Marcelline Deist as prescribed Work with [facility] if new prescription assistance application is needed for Farxiga Limit sugar and simple carbohydrates in diet Reach out to [facility] clinical staff if lancets and test strips are not filled at preferred pharmacy Schedule appt with PCP for May 2022 Increase physical activity level with a goal of at least 150 min a week Call RNCM as needed       Depression Screen PHQ 2/9 Scores 03/02/2021 01/21/2021 01/01/2021 09/10/2020 06/17/2020 03/14/2020 02/28/2020  PHQ - 2 Score 0 0 0 5 0 1 1  PHQ- 9 Score 2 - 3 17 4 4  -  Exception Documentation - - - - - - -  Not completed - - - - - - -    Fall Risk Fall Risk  03/02/2021 01/21/2021 01/01/2021 09/10/2020 07/25/2020  Falls in the past year? 1 0 1 0 1  Comment - - - - -  Number falls in past yr: 1 - 1 - 0  Comment - - - - -  Injury with Fall? 0 - 0 - 1  Risk for fall due to : History of fall(s);Impaired balance/gait;Orthopedic patient - - - History of fall(s);Impaired mobility;Impaired balance/gait  Follow up Falls  prevention discussed;Education provided - Falls prevention discussed - Falls prevention discussed;Falls evaluation completed    FALL RISK PREVENTION PERTAINING TO THE HOME:  Any stairs in or around the home? No  If so, are there any without handrails? No  Home free of loose throw rugs in walkways, pet beds, electrical cords, etc? Yes  Adequate lighting in your home to reduce risk of falls? Yes   ASSISTIVE DEVICES UTILIZED TO PREVENT FALLS:  Life alert? No  Use of a cane, walker or w/c? No  Grab bars in the bathroom? No  Shower chair or bench in shower? No  Elevated toilet seat or a handicapped toilet? Yes   TIMED UP AND GO:  Was the test performed? No . Telephonic visit  Cognitive Function: Normal cognitive status assessed by direct observation by this Nurse Health Advisor. No abnormalities found.    MMSE - Mini Mental State Exam 02/20/2018 09/30/2016  Orientation to time 3 4  Orientation to Place 5 4  Registration 3 3  Attention/ Calculation 4 5  Recall 3 -  Language- name 2 objects 2 2  Language- repeat 1 1  Language- follow 3 step command 3 3  Language- read & follow direction 1 1  Write a sentence 1 1  Copy design 1 1  Total score 27 -     6CIT Screen 02/28/2020 02/22/2019  What Year? 0 points 4 points  What month?  0 points 0 points  What time? 0 points 0 points  Count back from 20 2 points 0 points  Months in reverse 0 points 2 points  Repeat phrase 0 points 0 points  Total Score 2 6    Immunizations Immunization History  Administered Date(s) Administered   Fluad Quad(high Dose 65+) 01/22/2020, 01/21/2021   Influenza, High Dose Seasonal PF 02/02/2015, 02/09/2016, 01/13/2017, 01/07/2018, 12/18/2018   Influenza-Unspecified 12/18/2018   PFIZER Comirnaty(Gray Top)Covid-19 Tri-Sucrose Vaccine 06/18/2020   PFIZER(Purple Top)SARS-COV-2 Vaccination 09/29/2019, 10/29/2019   Pneumococcal Conjugate-13 01/05/2015   Pneumococcal Polysaccharide-23 02/09/2016   Zoster,  Live 01/05/2015    TDAP status: Due, Education has been provided regarding the importance of this vaccine. Advised may receive this vaccine at local pharmacy or Health Dept. Aware to provide a copy of the vaccination record if obtained from local pharmacy or Health Dept. Verbalized acceptance and understanding.  Flu Vaccine status: Up to date  Pneumococcal vaccine status: Up to date  Covid-19 vaccine status: Completed vaccines  Qualifies for Shingles Vaccine? Yes   Zostavax completed Yes   Shingrix Completed?: No.    Education has been provided regarding the importance of this vaccine. Patient has been advised to call insurance company to determine out of pocket expense if they have not yet received this vaccine. Advised may also receive vaccine at local pharmacy or Health Dept. Verbalized acceptance and understanding.  Screening Tests Health Maintenance  Topic Date Due   TETANUS/TDAP  Never done   MAMMOGRAM  12/16/2017   COVID-19 Vaccine (4 - Booster for Pfizer series) 08/13/2020   Zoster Vaccines- Shingrix (1 of 2) 04/02/2021 (Originally 01/05/2000)   DEXA SCAN  01/21/2022   COLONOSCOPY (Pts 45-49yrs Insurance coverage will need to be confirmed)  05/10/2026   Pneumonia Vaccine 51+ Years old  Completed   INFLUENZA VACCINE  Completed   Hepatitis C Screening  Completed   HPV VACCINES  Aged Out    Health Maintenance  Health Maintenance Due  Topic Date Due   TETANUS/TDAP  Never done   MAMMOGRAM  12/16/2017   COVID-19 Vaccine (4 - Booster for Pfizer series) 08/13/2020    Colorectal cancer screening: Type of screening: Colonoscopy. Completed 05/10/2016. Repeat every 10 years  Mammogram status: Completed 12/17/2015. Repeat every year appointment 03/27/2021  Bone Density status: Completed 01/22/2020. Results reflect: Bone density results: OSTEOPENIA. Repeat every 2 years.  Lung Cancer Screening: (Low Dose CT Chest recommended if Age 74-80 years, 30 pack-year currently smoking OR have  quit w/in 15years.) does not qualify.   Additional Screening:  Hepatitis C Screening: does qualify; Completed 10/14/2014  Vision Screening: Recommended annual ophthalmology exams for early detection of glaucoma and other disorders of the eye. Is the patient up to date with their annual eye exam?  Yes  Who is the provider or what is the name of the office in which the patient attends annual eye exams? [facility] [place] If pt is not established with a provider, would they like to be referred to a provider to establish care? No .   Dental Screening: Recommended annual dental exams for proper oral hygiene  Community Resource Referral / Chronic Care Management: CRR required this visit?  No   CCM required this visit?  No      Plan:     I have personally reviewed and noted the following in the patient's chart:   Medical and social history Use of alcohol, tobacco or illicit drugs  Current medications and supplements including opioid prescriptions.  Functional  ability and status Nutritional status Physical activity Advanced directives List of other physicians Hospitalizations, surgeries, and ER visits in previous 12 months Vitals Screenings to include cognitive, depression, and falls Referrals and appointments  In addition, I have reviewed and discussed with patient certain preventive protocols, quality metrics, and best practice recommendations. A written personalized care plan for preventive services as well as general preventive health recommendations were provided to patient.     [place] Constable, LPN   28/0/0349   Nurse Notes: None

## 2021-03-09 ENCOUNTER — Other Ambulatory Visit: Payer: Self-pay | Admitting: Nurse Practitioner

## 2021-03-09 ENCOUNTER — Other Ambulatory Visit: Payer: Self-pay | Admitting: Family Medicine

## 2021-03-09 DIAGNOSIS — K2100 Gastro-esophageal reflux disease with esophagitis, without bleeding: Secondary | ICD-10-CM

## 2021-03-09 DIAGNOSIS — J301 Allergic rhinitis due to pollen: Secondary | ICD-10-CM

## 2021-03-09 DIAGNOSIS — I10 Essential (primary) hypertension: Secondary | ICD-10-CM

## 2021-03-12 ENCOUNTER — Telehealth: Payer: Self-pay | Admitting: Family Medicine

## 2021-03-12 DIAGNOSIS — N1831 Chronic kidney disease, stage 3a: Secondary | ICD-10-CM

## 2021-03-12 DIAGNOSIS — I5032 Chronic diastolic (congestive) heart failure: Secondary | ICD-10-CM

## 2021-03-12 MED ORDER — DAPAGLIFLOZIN PROPANEDIOL 10 MG PO TABS: 10.0000 mg | ORAL_TABLET | Freq: Every day | ORAL | 6 refills | Status: DC

## 2021-03-12 NOTE — Telephone Encounter (Signed)
Rx re sent Left detailed message

## 2021-04-03 ENCOUNTER — Ambulatory Visit (INDEPENDENT_AMBULATORY_CARE_PROVIDER_SITE_OTHER): Payer: Medicare Other | Admitting: Family Medicine

## 2021-04-03 ENCOUNTER — Encounter: Payer: Self-pay | Admitting: Family Medicine

## 2021-04-03 VITALS — BP 131/79 | HR 71 | Temp 97.8°F | Ht 66.0 in | Wt 229.6 lb

## 2021-04-03 DIAGNOSIS — R531 Weakness: Secondary | ICD-10-CM

## 2021-04-03 DIAGNOSIS — I10 Essential (primary) hypertension: Secondary | ICD-10-CM | POA: Diagnosis not present

## 2021-04-03 DIAGNOSIS — F411 Generalized anxiety disorder: Secondary | ICD-10-CM

## 2021-04-03 DIAGNOSIS — E782 Mixed hyperlipidemia: Secondary | ICD-10-CM

## 2021-04-03 DIAGNOSIS — R2681 Unsteadiness on feet: Secondary | ICD-10-CM

## 2021-04-03 DIAGNOSIS — R7303 Prediabetes: Secondary | ICD-10-CM | POA: Diagnosis not present

## 2021-04-03 DIAGNOSIS — F339 Major depressive disorder, recurrent, unspecified: Secondary | ICD-10-CM

## 2021-04-03 DIAGNOSIS — E669 Obesity, unspecified: Secondary | ICD-10-CM

## 2021-04-03 DIAGNOSIS — N1831 Chronic kidney disease, stage 3a: Secondary | ICD-10-CM

## 2021-04-03 DIAGNOSIS — K2100 Gastro-esophageal reflux disease with esophagitis, without bleeding: Secondary | ICD-10-CM

## 2021-04-03 DIAGNOSIS — I5032 Chronic diastolic (congestive) heart failure: Secondary | ICD-10-CM

## 2021-04-03 NOTE — Progress Notes (Signed)
Assessment & Plan:  1. Essential hypertension Well controlled on current regimen.  - CMP14+EGFR  2. Stage 3a chronic kidney disease ([place]) Labs to assess. Continue Farxiga. - CMP14+EGFR  3. Prediabetes Labs to assess. - Bayer DCA Hb A1c Waived  4. Obesity (BMI 30-39.9) Patient has lost 5 lbs in the past 3 months. Continue healthy eating and trying to walk. - CMP14+EGFR  5. Chronic diastolic heart failure (HCC) Well controlled on current regimen.  - CMP14+EGFR  6. Mixed hyperlipidemia Well controlled on current regimen.  - CMP14+EGFR  7. Gastroesophageal reflux disease with esophagitis, unspecified whether hemorrhage Well controlled on current regimen.  - CMP14+EGFR  8. Recurrent depression ([place]) Well controlled on current regimen.  - CMP14+EGFR  9. Generalized anxiety disorder Well controlled on current regimen.  - CMP14+EGFR  10. General weakness - Ambulatory referral to Physical Therapy  11. Unsteady gait - Ambulatory referral to Physical Therapy   Return in about 4 months (around 08/02/2021) for follow-up of chronic medication conditions.  [name], MSN, APRN, FNP-C [facility]  Subjective:    Patient ID: Allison Rollins, female    DOB: 1950/08/26, 44 y.o.   MRN: [id number]  Patient Care Team: Loman Brooklyn, FNP as PCP - General (Family Medicine) Harl Bowie, Alphonse Guild, MD as PCP - Cardiology (Cardiology) Danie Binder, MD (Inactive) (Gastroenterology) Phillips Odor, MD as Consulting Physician (Neurology) Harl Bowie, Alphonse Guild, MD as Consulting Physician (Cardiology) Ilean China, RN as [place] Management   Chief Complaint:  Chief Complaint  Patient presents with   Prediabetes   Hypertension    3 month follow up of chronic medical conditions     HPI: Allison Rollins is a 70 y.o. female presenting on 04/03/2021 for Prediabetes and Hypertension (3 month follow up of chronic medical conditions  )  Depression/anxiety: Patient feels she is well controlled with sertraline.  This time of year is a little harder on her because all of her family has passed away.  Depression screen [facility] 2/9 03/02/2021 01/21/2021 01/01/2021  Decreased Interest 0 0 0  Down, Depressed, Hopeless 0 0 0  PHQ - 2 Score 0 0 0  Altered sleeping 1 - 1  Tired, decreased energy 1 - 1  Change in appetite 0 - 0  Feeling bad or failure about yourself  0 - 0  Trouble concentrating 0 - 0  Moving slowly or fidgety/restless 0 - 1  Suicidal thoughts 0 - 0  PHQ-9 Score 2 - 3  Difficult doing work/chores Somewhat difficult - Not difficult at all  Some recent data might be hidden   GAD 7 : Generalized Anxiety Score 01/01/2021 09/10/2020 06/17/2020 03/14/2020  Nervous, Anxious, on Edge 2 2 1 1   Control/stop worrying 1 1 1 2   Worry too much - different things 1 2 1 1   Trouble relaxing 1 0 1 0  Restless 1 2 1  0  Easily annoyed or irritable 1 1 0 0  Afraid - awful might happen 0 0 0 0  Total GAD 7 Score 7 8 5 4   Anxiety Difficulty Not difficult at all Not difficult at all Not difficult at all -    Difficulty sleeping: Patient reports she is taking ZzzQuil at bedtime which is effective.  Prediabetes: Most recent A1c 5.7 3 months ago.  Vitamin D deficiency: Well-controlled with vitamin D supplement of 1,000 units twice daily.  Patient is taking Wilder Glade due to CKD and HF.    GERD: Doing  well with Protonix 40 mg once daily and famotidine 20 mg at bedtime.  New complaints: None   Social history:  Relevant past medical, surgical, family and social history reviewed and updated as indicated. Interim medical history since our last visit reviewed.  Allergies and medications reviewed and updated.  DATA REVIEWED: CHART IN EPIC  ROS: Negative unless specifically indicated above in HPI.    Current Outpatient Medications:    albuterol (PROVENTIL) (2.5 MG/3ML) 0.083% nebulizer solution, Take 3 mLs (2.5 mg total) by  nebulization every 6 (six) hours as needed for wheezing or shortness of breath., Disp: 150 mL, Rfl: 1   albuterol (VENTOLIN HFA) 108 (90 Base) MCG/ACT inhaler, Inhale 2 puffs into the lungs every 6 (six) hours as needed for wheezing or shortness of breath., Disp: 1 each, Rfl: 0   Alcohol Swabs (B-D SINGLE USE SWABS REGULAR) PADS, Test sugars daily, Disp: 100 each, Rfl: 3   aspirin EC 81 MG tablet, Take 1 tablet (81 mg total) by mouth daily., Disp: , Rfl:    atorvastatin (LIPITOR) 40 MG tablet, Take 1 tablet (40 mg total) by mouth daily., Disp: 90 tablet, Rfl: 1   cetirizine (ZYRTEC) 10 MG tablet, Take 1 tablet by mouth every day, Disp: 30 tablet, Rfl: 11   cholecalciferol (VITAMIN D3) 25 MCG (1000 UT) tablet, Take 1,000 Units by mouth 2 (two) times daily. , Disp: , Rfl:    dapagliflozin propanediol (FARXIGA) 10 MG TABS tablet, Take 1 tablet (10 mg total) by mouth daily before breakfast., Disp: 90 tablet, Rfl: 6   famotidine (PEPCID) 20 MG tablet, Take 1 tablet (20 mg total) by mouth at bedtime., Disp: 90 tablet, Rfl: 1   fluticasone (FLONASE) 50 MCG/ACT nasal spray, Use 2 sprays into each nostril every day, Disp: 16 g, Rfl: 5   furosemide (LASIX) 20 MG tablet, Take 1 tablet (20 mg total) by mouth daily as needed., Disp: 90 tablet, Rfl: 1   gabapentin (NEURONTIN) 300 MG capsule, Take 1 capsule in morning & 2 caps at bedtime, Disp: 90 capsule, Rfl: 11   glucose blood (ACCU-CHEK AVIVA PLUS) test strip, Test sugars daily, Disp: 100 each, Rfl: 3   lisinopril (ZESTRIL) 10 MG tablet, Take 1 tablet by mouth every day, Disp: 30 tablet, Rfl: 2   methocarbamol (ROBAXIN) 500 MG tablet, Take 1 tablet by mouth 3 times a day as needed for muscle spasms, Disp: 30 tablet, Rfl: 11   Multiple Vitamin (MULTIVITAMIN WITH MINERALS) TABS tablet, Take 1 tablet by mouth daily., Disp: , Rfl:    nitroGLYCERIN (NITROSTAT) 0.4 MG SL tablet, Place 1 tablet (0.4 mg total) under the tongue every 5 (five) minutes as needed for chest  pain., Disp: 20 tablet, Rfl: 3   Omega-3 Fatty Acids (FISH OIL PO), Take 1 capsule by mouth daily. , Disp: , Rfl:    pantoprazole (PROTONIX) 40 MG tablet, Take 1 tablet by mouth twice daily, Disp: 60 tablet, Rfl: 2   potassium chloride SA (K-DUR) 20 MEQ tablet, Take 20 mEq by mouth daily as needed (with lasix)., Disp: , Rfl:    pramipexole (MIRAPEX) 0.5 MG tablet, Take 1 tablet (0.5 mg total) by mouth at bedtime., Disp: 90 tablet, Rfl: 1   sertraline (ZOLOFT) 100 MG tablet, Take 1.5 tablets by mouth every day, Disp: 135 tablet, Rfl: 1   Allergies  Allergen Reactions   Penicillins Itching and Rash    Has patient had a PCN reaction causing immediate rash, facial/tongue/throat swelling, SOB or lightheadedness with  hypotension: no Has patient had a PCN reaction causing severe rash involving mucus membranes or skin necrosis: no Has patient had a PCN reaction that required hospitalization: no Has patient had a PCN reaction occurring within the last 10 years: no If all of the above answers are "NO", then may proceed with Cephalosporin use.    Sulfa Antibiotics Rash   Past Medical History:  Diagnosis Date   Anxiety    Asthma    Chest pain 12/22/2015   CHF (congestive heart failure) (HCC)    COPD (chronic obstructive pulmonary disease) (HCC)    Depression    Headache    HTN (hypertension)    Hypokalemia 07/28/2013   Kidney stones    Osteopenia 01/22/2020   Parkinson's disease    Pneumonia    Pre-diabetes    S/P colonoscopy 10/15/04   normal    Past Surgical History:  Procedure Laterality Date   CHOLECYSTECTOMY     COLONOSCOPY  10/15/2004   Normal rectum/Normal colon   COLONOSCOPY N/A 05/10/2016   Procedure: COLONOSCOPY;  Surgeon: Danie Binder, MD;  [facility];  Service: Endoscopy;  Laterality: N/A;  10:30   ESOPHAGEAL MANOMETRY N/A 04/02/2019   Procedure: ESOPHAGEAL MANOMETRY (EM);  Surgeon: Mauri Pole, MD;  Location: [facility];  Service: Endoscopy;   Laterality: N/A;   ESOPHAGOGASTRODUODENOSCOPY  09/11/2010   Dysphagia likely multifactorial (possible Candida esophagitis, likely nonspecific esophageal motility disorder, and/or uncontrolled gastroesophageal reflux disease), status post empiric dilation   ESOPHAGOGASTRODUODENOSCOPY N/A 11/13/2018   Dr. Oneida Alar: No endoscopic esophageal abnormality to explain patient's dysphagia.  Esophagus dilated.  Moderate erosive/nodular gastritis with benign biopsies.   KIDNEY STONE SURGERY     SAVORY DILATION N/A 11/13/2018   Procedure: SAVORY DILATION;  Surgeon: Danie Binder, MD;  [facility];  Service: Endoscopy;  Laterality: N/A;    Social History   Socioeconomic History   Marital status: Widowed    Spouse name: Not on file   Number of children: 2   Years of education: Not on file   Highest education level: Not on file  Occupational History   Occupation: disabled  Tobacco Use   Smoking status: Former    Packs/day: 0.25    Years: 30.00    Pack years: 7.50    Types: Cigarettes    Start date: 01/04/1965    Quit date: 01/03/1995    Years since quitting: 26.2   Smokeless tobacco: Never  Vaping Use   Vaping Use: Never used  Substance and Sexual Activity   Alcohol use: No    Alcohol/week: 0.0 standard drinks   Drug use: No   Sexual activity: Not on file  Other Topics Concern   Not on file  Social History Narrative   Lives alone. No stairs. Has ramps. Handicap bathroom   Social Determinants of Health   Financial Resource Strain: Low Risk    Difficulty of Paying Living Expenses: Not very hard  Food Insecurity: Food Insecurity Present   Worried About Running Out of Food in the Last Year: Sometimes true   Ran Out of Food in the Last Year: Never true  Transportation Needs: No Transportation Needs   Lack of Transportation (Medical): No   Lack of Transportation (Non-Medical): No  Physical Activity: Insufficiently Active   Days of Exercise per Week: 6 days   Minutes of Exercise  per Session: 20 min  Stress: No Stress Concern Present   Feeling of Stress : Not at all  Social Connections:  Moderately Integrated   Frequency of Communication with Friends and Family: More than three times a week   Frequency of Social Gatherings with Friends and Family: More than three times a week   Attends Religious Services: More than 4 times per year   Active Member of [employer] or Organizations: Yes   Attends Archivist Meetings: More than 4 times per year   Marital Status: Widowed  Human resources officer Violence: Not At Risk   Fear of Current or Ex-Partner: No   Emotionally Abused: No   Physically Abused: No   Sexually Abused: No        Objective:    BP 131/79   Pulse 71   Temp 97.8 F (36.6 C) (Temporal)   Ht 5' 6"  (1.676 m)   Wt 229 lb 9.6 oz (104.1 kg)   SpO2 93%   BMI 37.06 kg/m   Wt Readings from Last 3 Encounters:  04/03/21 229 lb 9.6 oz (104.1 kg)  03/02/21 234 lb (106.1 kg)  01/21/21 234 lb (106.1 kg)    Physical Exam Vitals reviewed.  Constitutional:      General: She is not in acute distress.    Appearance: Normal appearance. She is morbidly obese. She is not ill-appearing, toxic-appearing or diaphoretic.  HENT:     Head: Normocephalic and atraumatic.  Eyes:     General: No scleral icterus.       Right eye: No discharge.        Left eye: No discharge.     Conjunctiva/sclera: Conjunctivae normal.  Cardiovascular:     Rate and Rhythm: Normal rate and regular rhythm.     Heart sounds: Normal heart sounds. No murmur heard.   No friction rub. No gallop.  Pulmonary:     Effort: Pulmonary effort is normal. No respiratory distress.     Breath sounds: Normal breath sounds. No stridor. No wheezing, rhonchi or rales.  Musculoskeletal:        General: Normal range of motion.     Cervical back: Normal range of motion.  Skin:    General: Skin is warm and dry.     Capillary Refill: Capillary refill takes less than 2 seconds.     Comments: Psoriasis  patch at the base of her scalp.   Neurological:     General: No focal deficit present.     Mental Status: She is alert and oriented to person, place, and time. Mental status is at baseline.     Motor: Weakness present.     Gait: Gait abnormal (ambulating with cane).  Psychiatric:        Mood and Affect: Mood normal.        Behavior: Behavior normal.        Thought Content: Thought content normal.        Judgment: Judgment normal.    Lab Results  Component Value Date   TSH 2.420 06/17/2020   Lab Results  Component Value Date   WBC 7.6 01/01/2021   HGB 15.7 01/01/2021   HCT 46.7 (H) 01/01/2021   MCV 89 01/01/2021   PLT 214 01/01/2021   Lab Results  Component Value Date   NA 146 (H) 01/01/2021   K 4.6 01/01/2021   CO2 27 01/01/2021   GLUCOSE 108 (H) 01/01/2021   BUN 14 01/01/2021   CREATININE 1.06 (H) 01/01/2021   BILITOT 0.6 01/01/2021   ALKPHOS 96 01/01/2021   AST 19 01/01/2021   ALT 12 01/01/2021   PROT 6.4 01/01/2021  ALBUMIN 4.2 01/01/2021   CALCIUM 9.4 01/01/2021   ANIONGAP 13 07/17/2019   EGFR 57 (L) 01/01/2021   Lab Results  Component Value Date   CHOL 170 01/01/2021   Lab Results  Component Value Date   HDL 42 01/01/2021   Lab Results  Component Value Date   LDLCALC 98 01/01/2021   Lab Results  Component Value Date   TRIG 171 (H) 01/01/2021   Lab Results  Component Value Date   CHOLHDL 4.0 01/01/2021   Lab Results  Component Value Date   HGBA1C 5.7 (H) 01/01/2021

## 2021-04-04 LAB — CMP14+EGFR
ALT: 16 IU/L (ref 0–32)
AST: 19 IU/L (ref 0–40)
Albumin/Globulin Ratio: 1.8 (ref 1.2–2.2)
Albumin: 4.2 g/dL (ref 3.7–4.7)
Alkaline Phosphatase: 110 IU/L (ref 44–121)
BUN/Creatinine Ratio: 10 — ABNORMAL LOW (ref 12–28)
BUN: 10 mg/dL (ref 8–27)
Bilirubin Total: 0.6 mg/dL (ref 0.0–1.2)
CO2: 30 mmol/L — ABNORMAL HIGH (ref 20–29)
Calcium: 9.6 mg/dL (ref 8.7–10.3)
Chloride: 104 mmol/L (ref 96–106)
Creatinine, Ser: 0.96 mg/dL (ref 0.57–1.00)
Globulin, Total: 2.3 g/dL (ref 1.5–4.5)
Glucose: 100 mg/dL — ABNORMAL HIGH (ref 70–99)
Potassium: 4.8 mmol/L (ref 3.5–5.2)
Sodium: 144 mmol/L (ref 134–144)
Total Protein: 6.5 g/dL (ref 6.0–8.5)
eGFR: 63 mL/min/{1.73_m2} (ref >59)

## 2021-04-06 ENCOUNTER — Telehealth: Payer: Self-pay | Admitting: Family Medicine

## 2021-04-06 LAB — BAYER DCA HB A1C WAIVED: HB A1C (BAYER DCA - WAIVED): 5.6 % (ref 4.8–5.6)

## 2021-04-06 NOTE — Telephone Encounter (Signed)
Informed pt that labs have not been reviewed at this time and that we will call her once Britney does.

## 2021-04-09 ENCOUNTER — Ambulatory Visit: Payer: Medicare Other | Admitting: Cardiology

## 2021-04-09 NOTE — Progress Notes (Deleted)
Clinical Summary Ms. Kurka is a 70 y.o.female seen today for follow up of the following medical problems.     1. Chronic diastolic heart failure   - no recent edema per her report     2. HTN - she is compliant with meds   3. COPD - compliant with inhalers   4. Bradycardia - no recent symptoms   5. Hyperlipidemia   10/2018 TC 219 TG 196 HDL 45 LDL 135 - 12/2019 TC 182 TG 154 HDL 47 LD 108 - compliant with statin   6. OSA screen  - she reports test was negative.        SH: has 3 grandkids, 2 sons. Husband just passed this March. Works at [employer].  One grandson will be starting at [facility] [employer] in 2 years. Granddaughter   Another grandson due Oct/Nov.    Past Medical History:  Diagnosis Date   Anxiety    Asthma    Chest pain 12/22/2015   CHF (congestive heart failure) (HCC)    COPD (chronic obstructive pulmonary disease) (HCC)    Depression    Headache    HTN (hypertension)    Hypokalemia 07/28/2013   Kidney stones    Osteopenia 01/22/2020   Parkinson's disease    Pneumonia    Pre-diabetes    S/P colonoscopy 10/15/04   normal   Vitamin D deficiency 11/24/2018     Allergies  Allergen Reactions   Penicillins Itching and Rash    Has patient had a PCN reaction causing immediate rash, facial/tongue/throat swelling, SOB or lightheadedness with hypotension: no Has patient had a PCN reaction causing severe rash involving mucus membranes or skin necrosis: no Has patient had a PCN reaction that required hospitalization: no Has patient had a PCN reaction occurring within the last 10 years: no If all of the above answers are "NO", then may proceed with Cephalosporin use.    Sulfa Antibiotics Rash     Current Outpatient Medications  Medication Sig Dispense Refill   albuterol (PROVENTIL) (2.5 MG/3ML) 0.083% nebulizer solution Take 3 mLs (2.5 mg total) by nebulization every 6 (six) hours as needed for wheezing or shortness of breath. 150 mL 1    albuterol (VENTOLIN HFA) 108 (90 Base) MCG/ACT inhaler Inhale 2 puffs into the lungs every 6 (six) hours as needed for wheezing or shortness of breath. 1 each 0   Alcohol Swabs (B-D SINGLE USE SWABS REGULAR) PADS Test sugars daily 100 each 3   aspirin EC 81 MG tablet Take 1 tablet (81 mg total) by mouth daily.     atorvastatin (LIPITOR) 40 MG tablet Take 1 tablet (40 mg total) by mouth daily. 90 tablet 1   cetirizine (ZYRTEC) 10 MG tablet Take 1 tablet by mouth every day 30 tablet 11   cholecalciferol (VITAMIN D3) 25 MCG (1000 UT) tablet Take 1,000 Units by mouth 2 (two) times daily.      dapagliflozin propanediol (FARXIGA) 10 MG TABS tablet Take 1 tablet (10 mg total) by mouth daily before breakfast. 90 tablet 6   famotidine (PEPCID) 20 MG tablet Take 1 tablet (20 mg total) by mouth at bedtime. 90 tablet 1   fluticasone (FLONASE) 50 MCG/ACT nasal spray Use 2 sprays into each nostril every day 16 g 5   furosemide (LASIX) 20 MG tablet Take 1 tablet (20 mg total) by mouth daily as needed. 90 tablet 1   gabapentin (NEURONTIN) 300 MG capsule Take 1 capsule in morning & 2 caps  at bedtime 90 capsule 11   glucose blood (ACCU-CHEK AVIVA PLUS) test strip Test sugars daily 100 each 3   lisinopril (ZESTRIL) 10 MG tablet Take 1 tablet by mouth every day 30 tablet 2   methocarbamol (ROBAXIN) 500 MG tablet Take 1 tablet by mouth 3 times a day as needed for muscle spasms 30 tablet 11   Multiple Vitamin (MULTIVITAMIN WITH MINERALS) TABS tablet Take 1 tablet by mouth daily.     nitroGLYCERIN (NITROSTAT) 0.4 MG SL tablet Place 1 tablet (0.4 mg total) under the tongue every 5 (five) minutes as needed for chest pain. 20 tablet 3   Omega-3 Fatty Acids (FISH OIL PO) Take 1 capsule by mouth daily.      pantoprazole (PROTONIX) 40 MG tablet Take 1 tablet by mouth twice daily 60 tablet 2   potassium chloride SA (K-DUR) 20 MEQ tablet Take 20 mEq by mouth daily as needed (with lasix).     pramipexole (MIRAPEX) 0.5 MG tablet  Take 1 tablet (0.5 mg total) by mouth at bedtime. 90 tablet 1   sertraline (ZOLOFT) 100 MG tablet Take 1.5 tablets by mouth every day 135 tablet 1   No current facility-administered medications for this visit.     Past Surgical History:  Procedure Laterality Date   CHOLECYSTECTOMY     COLONOSCOPY  10/15/2004   Normal rectum/Normal colon   COLONOSCOPY N/A 05/10/2016   Procedure: COLONOSCOPY;  Surgeon: Danie Binder, MD;  [facility];  Service: Endoscopy;  Laterality: N/A;  10:30   ESOPHAGEAL MANOMETRY N/A 04/02/2019   Procedure: ESOPHAGEAL MANOMETRY (EM);  Surgeon: Mauri Pole, MD;  [facility];  Service: Endoscopy;  Laterality: N/A;   ESOPHAGOGASTRODUODENOSCOPY  09/11/2010   Dysphagia likely multifactorial (possible Candida esophagitis, likely nonspecific esophageal motility disorder, and/or uncontrolled gastroesophageal reflux disease), status post empiric dilation   ESOPHAGOGASTRODUODENOSCOPY N/A 11/13/2018   Dr. Oneida Alar: No endoscopic esophageal abnormality to explain patient's dysphagia.  Esophagus dilated.  Moderate erosive/nodular gastritis with benign biopsies.   KIDNEY STONE SURGERY     SAVORY DILATION N/A 11/13/2018   Procedure: SAVORY DILATION;  Surgeon: Danie Binder, MD;  [facility];  Service: Endoscopy;  Laterality: N/A;     Allergies  Allergen Reactions   Penicillins Itching and Rash    Has patient had a PCN reaction causing immediate rash, facial/tongue/throat swelling, SOB or lightheadedness with hypotension: no Has patient had a PCN reaction causing severe rash involving mucus membranes or skin necrosis: no Has patient had a PCN reaction that required hospitalization: no Has patient had a PCN reaction occurring within the last 10 years: no If all of the above answers are "NO", then may proceed with Cephalosporin use.    Sulfa Antibiotics Rash      Family History  Problem Relation Age of Onset   Coronary artery  disease Father    COPD Mother    Asthma Brother    Coronary artery disease Sister    Diabetes Sister    Coronary artery disease Brother    Coronary artery disease Sister      Social History Ms. Semidey reports that she quit smoking about 26 years ago. Her smoking use included cigarettes. She started smoking about 56 years ago. She has a 7.50 pack-year smoking history. She has never used smokeless tobacco. Ms. Amsler reports no history of alcohol use.   Review of Systems CONSTITUTIONAL: No weight loss, fever, chills, weakness or fatigue.  HEENT: Eyes: No visual loss,  blurred vision, double vision or yellow sclerae.No hearing loss, sneezing, congestion, runny nose or sore throat.  SKIN: No rash or itching.  CARDIOVASCULAR:  RESPIRATORY: No shortness of breath, cough or sputum.  GASTROINTESTINAL: No anorexia, nausea, vomiting or diarrhea. No abdominal pain or blood.  GENITOURINARY: No burning on urination, no polyuria NEUROLOGICAL: No headache, dizziness, syncope, paralysis, ataxia, numbness or tingling in the extremities. No change in bowel or bladder control.  MUSCULOSKELETAL: No muscle, back pain, joint pain or stiffness.  LYMPHATICS: No enlarged nodes. No history of splenectomy.  PSYCHIATRIC: No history of depression or anxiety.  ENDOCRINOLOGIC: No reports of sweating, cold or heat intolerance. No polyuria or polydipsia.  Marland Kitchen   Physical Examination There were no vitals filed for this visit. There were no vitals filed for this visit.  Gen: resting comfortably, no acute distress HEENT: no scleral icterus, pupils equal round and reactive, no palptable cervical adenopathy,  CV Resp: Clear to auscultation bilaterally GI: abdomen is soft, non-tender, non-distended, normal bowel sounds, no hepatosplenomegaly MSK: extremities are warm, no edema.  Skin: warm, no rash Neuro:  no focal deficits Psych: appropriate affect   Diagnostic Studies  11/2013 Echo Study Conclusions  -  Procedure narrative: Transthoracic echocardiography. Image quality was suboptimal. The study was technically difficult, as a result of poor sound wave transmission and body habitus. - Left ventricle: The cavity size was normal. Wall thickness was increased in a pattern of mild LVH. Systolic function was low normal. The estimated ejection fraction was approximately 50%. Images were inadequate for LV wall motion assessment, but no gross regional variation was noted. Features are consistent with a pseudonormal left ventricular filling pattern, with concomitant abnormal relaxation and increased filling pressure (grade 2 diastolic dysfunction). Doppler parameters are consistent with both elevated ventricular end-diastolic filling pressure and elevated left atrial filling pressure. - Mitral valve: Mildly calcified annulus. Mildly thickened leaflets . There was mild regurgitation. - Left atrium: The atrium was moderately to severely dilated.   11/2015  Nuclear stress There was no ST segment deviation noted during stress. No T wave inversion was noted during stress. Findings consistent with prior myocardial infarction. This is a low risk study. The left ventricular ejection fraction is mildly decreased (45-54%).   Small inferolateral wall infarct at mid and basal level EF estimated 50% but looks normal no ischemia   Assessment and Plan   1. Chronic diastolic heart failure - overall controlled, continue diuretic.    2. HTN -manual recheck 134/80, essentially at goal. Continue current meds   3. Bradycardia - chronic sinus bradycardia that is asymptomatic - continue to monitor.    4. Hyperlipidemia - at goal, continue statin     Antoine Poche, M.D.

## 2021-04-14 ENCOUNTER — Ambulatory Visit: Payer: Medicare Other

## 2021-04-14 ENCOUNTER — Telehealth: Payer: Self-pay | Admitting: Family Medicine

## 2021-04-14 NOTE — Telephone Encounter (Signed)
Pt aware to try meclizine or dramimine otc

## 2021-04-26 DIAGNOSIS — I4891 Unspecified atrial fibrillation: Secondary | ICD-10-CM

## 2021-04-26 HISTORY — DX: Unspecified atrial fibrillation: I48.91

## 2021-05-04 ENCOUNTER — Telehealth: Payer: Self-pay | Admitting: Family Medicine

## 2021-05-05 NOTE — Telephone Encounter (Signed)
I spoke to pt and she says her glucometer won't turn on. Advised pt it may be the battery needs to be replaced and to take the entire glucometer to the pharmacy and see if they can assist with replacing the battery. Pt voiced understanding.

## 2021-05-08 ENCOUNTER — Other Ambulatory Visit: Payer: Self-pay | Admitting: Family Medicine

## 2021-05-08 DIAGNOSIS — K2100 Gastro-esophageal reflux disease with esophagitis, without bleeding: Secondary | ICD-10-CM

## 2021-05-20 ENCOUNTER — Telehealth: Payer: Self-pay | Admitting: Family Medicine

## 2021-05-20 NOTE — Telephone Encounter (Signed)
Needs appointment for prescription. Use one of my COVID spots if needed.

## 2021-05-21 NOTE — Telephone Encounter (Signed)
Pt aware of provider feedback and scheduled with B Alona Bene tomorrow at 3:35.

## 2021-05-22 ENCOUNTER — Ambulatory Visit: Payer: Medicare Other | Admitting: Family Medicine

## 2021-05-25 ENCOUNTER — Inpatient Hospital Stay: Admission: RE | Admit: 2021-05-25 | Payer: Medicare Other | Source: Ambulatory Visit

## 2021-06-07 ENCOUNTER — Other Ambulatory Visit: Payer: Self-pay | Admitting: Family Medicine

## 2021-06-07 DIAGNOSIS — K2100 Gastro-esophageal reflux disease with esophagitis, without bleeding: Secondary | ICD-10-CM

## 2021-06-07 DIAGNOSIS — I10 Essential (primary) hypertension: Secondary | ICD-10-CM

## 2021-06-18 ENCOUNTER — Ambulatory Visit (INDEPENDENT_AMBULATORY_CARE_PROVIDER_SITE_OTHER): Payer: Medicare Other | Admitting: Family Medicine

## 2021-06-18 ENCOUNTER — Encounter: Payer: Self-pay | Admitting: Family Medicine

## 2021-06-18 VITALS — BP 131/72 | HR 66 | Temp 95.1°F | Ht 66.0 in | Wt 233.2 lb

## 2021-06-18 DIAGNOSIS — R531 Weakness: Secondary | ICD-10-CM | POA: Diagnosis not present

## 2021-06-18 DIAGNOSIS — I5032 Chronic diastolic (congestive) heart failure: Secondary | ICD-10-CM

## 2021-06-18 DIAGNOSIS — R296 Repeated falls: Secondary | ICD-10-CM | POA: Diagnosis not present

## 2021-06-18 DIAGNOSIS — R269 Unspecified abnormalities of gait and mobility: Secondary | ICD-10-CM | POA: Diagnosis not present

## 2021-06-18 MED ORDER — FUROSEMIDE 20 MG PO TABS: 20.0000 mg | ORAL_TABLET | Freq: Every day | ORAL | 1 refills | Status: DC | PRN

## 2021-06-18 NOTE — Progress Notes (Signed)
Assessment & Plan:  1-3. Generalized weakness/Multiple falls/Gait abnormality Encouraged to use walker at all times and take her time completing tasks. She was also given a list of sitters in our area to call for more daily assistance, although this would be an out-of-pocket expense that her insurance would not cover.  - Ambulatory referral to [facility]   Return as scheduled.  [name], MSN, APRN, FNP-C [facility]  Subjective:    Patient ID: Allison Rollins, female    DOB: [birth year], 71 y.o.   MRN: [id number]  Patient Care Team: Loman Brooklyn, FNP as PCP - General (Family Medicine) Harl Bowie, Alphonse Guild, MD as PCP - Cardiology (Cardiology) Danie Binder, MD (Inactive) (Gastroenterology) Phillips Odor, MD as Consulting Physician (Neurology) Harl Bowie, Alphonse Guild, MD as Consulting Physician (Cardiology) Ilean China, RN as [facility]   Chief Complaint:  Chief Complaint  Patient presents with   F2F home health    HPI: Allison Rollins is a 71 y.o. female presenting on 06/18/2021 for F2F home health  Patient is here for a face-to-face visit due to generalized weakness and difficulty caring for herself at home. She has had multiple falls recently. Her most recent fall was this past weekend when she was getting out of the bed. She has a hard time standing for prolonged periods to do things like cook meals, shower, and dressing herself.   New complaints: None   Social history:  Relevant past medical, surgical, family and social history reviewed and updated as indicated. Interim medical history since our last visit reviewed.  Allergies and medications reviewed and updated.  DATA REVIEWED: CHART IN EPIC  ROS: Negative unless specifically indicated above in HPI.    Current Outpatient Medications:    albuterol (PROVENTIL) (2.5 MG/3ML) 0.083% nebulizer solution, Take 3 mLs (2.5 mg total) by nebulization every 6 (six)  hours as needed for wheezing or shortness of breath., Disp: 150 mL, Rfl: 1   albuterol (VENTOLIN HFA) 108 (90 Base) MCG/ACT inhaler, Inhale 2 puffs into the lungs every 6 (six) hours as needed for wheezing or shortness of breath., Disp: 1 each, Rfl: 0   Alcohol Swabs (B-D SINGLE USE SWABS REGULAR) PADS, Test sugars daily, Disp: 100 each, Rfl: 3   aspirin EC 81 MG tablet, Take 1 tablet (81 mg total) by mouth daily., Disp: , Rfl:    atorvastatin (LIPITOR) 40 MG tablet, Take 1 tablet (40 mg total) by mouth daily., Disp: 90 tablet, Rfl: 1   cetirizine (ZYRTEC) 10 MG tablet, Take 1 tablet by mouth every day, Disp: 30 tablet, Rfl: 11   cholecalciferol (VITAMIN D3) 25 MCG (1000 UT) tablet, Take 1,000 Units by mouth 2 (two) times daily. , Disp: , Rfl:    dapagliflozin propanediol (FARXIGA) 10 MG TABS tablet, Take 1 tablet (10 mg total) by mouth daily before breakfast., Disp: 90 tablet, Rfl: 6   famotidine (PEPCID) 20 MG tablet, Take 1 tablet by mouth at bedtime, Disp: 30 tablet, Rfl: 5   fluticasone (FLONASE) 50 MCG/ACT nasal spray, Use 2 sprays into each nostril every day, Disp: 16 g, Rfl: 5   furosemide (LASIX) 20 MG tablet, Take 1 tablet (20 mg total) by mouth daily as needed., Disp: 90 tablet, Rfl: 1   gabapentin (NEURONTIN) 300 MG capsule, Take 1 capsule in morning & 2 caps at bedtime, Disp: 90 capsule, Rfl: 11   glucose blood (ACCU-CHEK AVIVA PLUS) test strip, Test sugars daily, Disp: 100  each, Rfl: 3   lisinopril (ZESTRIL) 10 MG tablet, Take 1 tablet by mouth every day, Disp: 30 tablet, Rfl: 1   methocarbamol (ROBAXIN) 500 MG tablet, Take 1 tablet by mouth 3 times a day as needed for muscle spasms, Disp: 30 tablet, Rfl: 11   Multiple Vitamin (MULTIVITAMIN WITH MINERALS) TABS tablet, Take 1 tablet by mouth daily., Disp: , Rfl:    nitroGLYCERIN (NITROSTAT) 0.4 MG SL tablet, Place 1 tablet (0.4 mg total) under the tongue every 5 (five) minutes as needed for chest pain., Disp: 20 tablet, Rfl: 3   Omega-3  Fatty Acids (FISH OIL PO), Take 1 capsule by mouth daily. , Disp: , Rfl:    pantoprazole (PROTONIX) 40 MG tablet, Take 1 tablet by mouth twice daily, Disp: 60 tablet, Rfl: 1   potassium chloride SA (K-DUR) 20 MEQ tablet, Take 20 mEq by mouth daily as needed (with lasix)., Disp: , Rfl:    pramipexole (MIRAPEX) 0.5 MG tablet, Take 1 tablet (0.5 mg total) by mouth at bedtime., Disp: 90 tablet, Rfl: 1   sertraline (ZOLOFT) 100 MG tablet, Take 1.5 tablets by mouth every day, Disp: 135 tablet, Rfl: 1   Allergies  Allergen Reactions   Penicillins Itching and Rash    Has patient had a PCN reaction causing immediate rash, facial/tongue/throat swelling, SOB or lightheadedness with hypotension: no Has patient had a PCN reaction causing severe rash involving mucus membranes or skin necrosis: no Has patient had a PCN reaction that required hospitalization: no Has patient had a PCN reaction occurring within the last 10 years: no If all of the above answers are "NO", then may proceed with Cephalosporin use.    Sulfa Antibiotics Rash   Past Medical History:  Diagnosis Date   Anxiety    Asthma    Chest pain 12/22/2015   CHF (congestive heart failure) (HCC)    COPD (chronic obstructive pulmonary disease) (HCC)    Depression    Headache    HTN (hypertension)    Hypokalemia 07/28/2013   Kidney stones    Osteopenia 01/22/2020   Parkinson's disease    Pneumonia    Pre-diabetes    S/P colonoscopy 10/15/04   normal   Vitamin D deficiency 11/24/2018    Past Surgical History:  Procedure Laterality Date   CHOLECYSTECTOMY     COLONOSCOPY  10/15/2004   Normal rectum/Normal colon   COLONOSCOPY N/A 05/10/2016   Procedure: COLONOSCOPY;  Surgeon: Danie Binder, MD;  Location: [facility];  Service: Endoscopy;  Laterality: N/A;  10:30   ESOPHAGEAL MANOMETRY N/A 04/02/2019   Procedure: ESOPHAGEAL MANOMETRY (EM);  Surgeon: Mauri Pole, MD;  Location: [facility];  Service: Endoscopy;  Laterality: N/A;    ESOPHAGOGASTRODUODENOSCOPY  09/11/2010   Dysphagia likely multifactorial (possible Candida esophagitis, likely nonspecific esophageal motility disorder, and/or uncontrolled gastroesophageal reflux disease), status post empiric dilation   ESOPHAGOGASTRODUODENOSCOPY N/A 11/13/2018   Dr. Oneida Alar: No endoscopic esophageal abnormality to explain patient's dysphagia.  Esophagus dilated.  Moderate erosive/nodular gastritis with benign biopsies.   KIDNEY STONE SURGERY     SAVORY DILATION N/A 11/13/2018   Procedure: SAVORY DILATION;  Surgeon: Danie Binder, MD;  Location: [facility];  Service: Endoscopy;  Laterality: N/A;    Social History   Socioeconomic History   Marital status: Widowed    Spouse name: Not on file   Number of children: 2   Years of education: Not on file   Highest education level: Not on file  Occupational  History   Occupation: disabled  Tobacco Use   Smoking status: Former    Packs/day: 0.25    Years: 30.00    Pack years: 7.50    Types: Cigarettes    Start date: 01/04/1965    Quit date: 01/03/1995    Years since quitting: 26.4   Smokeless tobacco: Never  Vaping Use   Vaping Use: Never used  Substance and Sexual Activity   Alcohol use: No    Alcohol/week: 0.0 standard drinks   Drug use: No   Sexual activity: Not on file  Other Topics Concern   Not on file  Social History Narrative   Lives alone. No stairs. Has ramps. Handicap bathroom   Social Determinants of Health   Financial Resource Strain: Low Risk    Difficulty of Paying Living Expenses: Not very hard  Food Insecurity: Food Insecurity Present   Worried About Running Out of Food in the Last Year: Sometimes true   Ran Out of Food in the Last Year: Never true  Transportation Needs: No Transportation Needs   Lack of Transportation (Medical): No   Lack of Transportation (Non-Medical): No  Physical Activity: Insufficiently Active   Days of Exercise per Week: 6 days   Minutes of Exercise per Session: 20  min  Stress: No Stress Concern Present   Feeling of Stress : Not at all  Social Connections: Moderately Integrated   Frequency of Communication with Friends and Family: More than three times a week   Frequency of Social Gatherings with Friends and Family: More than three times a week   Attends Religious Services: More than 4 times per year   Active Member of [employer] or Organizations: Yes   Attends Archivist Meetings: More than 4 times per year   Marital Status: Widowed  Human resources officer Violence: Not At Risk   Fear of Current or Ex-Partner: No   Emotionally Abused: No   Physically Abused: No   Sexually Abused: No        Objective:    BP 131/72    Pulse 66    Temp (!) 95.1 F (35.1 C) (Temporal)    Ht 5' 6"  (1.676 m)    Wt 233 lb 3.2 oz (105.8 kg)    SpO2 94%    BMI 37.64 kg/m   Wt Readings from Last 3 Encounters:  06/18/21 233 lb 3.2 oz (105.8 kg)  04/03/21 229 lb 9.6 oz (104.1 kg)  03/02/21 234 lb (106.1 kg)    Physical Exam Vitals reviewed.  Constitutional:      General: She is not in acute distress.    Appearance: Normal appearance. She is obese. She is not ill-appearing, toxic-appearing or diaphoretic.  HENT:     Head: Normocephalic and atraumatic.  Eyes:     General: No scleral icterus.       Right eye: No discharge.        Left eye: No discharge.     Conjunctiva/sclera: Conjunctivae normal.  Cardiovascular:     Rate and Rhythm: Normal rate and regular rhythm.     Heart sounds: Normal heart sounds. No murmur heard.   No friction rub. No gallop.  Pulmonary:     Effort: Pulmonary effort is normal. No respiratory distress.     Breath sounds: Normal breath sounds. No stridor. No wheezing, rhonchi or rales.  Musculoskeletal:        General: Normal range of motion.     Cervical back: Normal range of motion.  Skin:  General: Skin is warm and dry.     Capillary Refill: Capillary refill takes less than 2 seconds.  Neurological:     General: No focal  deficit present.     Mental Status: She is alert and oriented to person, place, and time. Mental status is at baseline.     Motor: Weakness present.     Gait: Gait abnormal (ambulating with a walker).  Psychiatric:        Mood and Affect: Mood normal.        Behavior: Behavior normal.        Thought Content: Thought content normal.        Judgment: Judgment normal.    Lab Results  Component Value Date   TSH 2.420 06/17/2020   Lab Results  Component Value Date   WBC 7.6 01/01/2021   HGB 15.7 01/01/2021   HCT 46.7 (H) 01/01/2021   MCV 89 01/01/2021   PLT 214 01/01/2021   Lab Results  Component Value Date   NA 144 04/03/2021   K 4.8 04/03/2021   CO2 30 (H) 04/03/2021   GLUCOSE 100 (H) 04/03/2021   BUN 10 04/03/2021   CREATININE 0.96 04/03/2021   BILITOT 0.6 04/03/2021   ALKPHOS 110 04/03/2021   AST 19 04/03/2021   ALT 16 04/03/2021   PROT 6.5 04/03/2021   ALBUMIN 4.2 04/03/2021   CALCIUM 9.6 04/03/2021   ANIONGAP 13 07/17/2019   EGFR 63 04/03/2021   Lab Results  Component Value Date   CHOL 170 01/01/2021   Lab Results  Component Value Date   HDL 42 01/01/2021   Lab Results  Component Value Date   LDLCALC 98 01/01/2021   Lab Results  Component Value Date   TRIG 171 (H) 01/01/2021   Lab Results  Component Value Date   CHOLHDL 4.0 01/01/2021   Lab Results  Component Value Date   HGBA1C 5.6 04/03/2021

## 2021-06-21 ENCOUNTER — Encounter: Payer: Self-pay | Admitting: Family Medicine

## 2021-06-23 ENCOUNTER — Telehealth: Payer: Self-pay | Admitting: Family Medicine

## 2021-06-23 NOTE — Telephone Encounter (Signed)
I put in a referral for home health last week.  What is she asking for?

## 2021-06-24 NOTE — Telephone Encounter (Signed)
Patient states she spoke with insurance and they told her to give you this fax number.  Patient is un sure why and states she will call them back and see why and let us know.  ?

## 2021-06-30 NOTE — Telephone Encounter (Signed)
Patient calling to check on status. Please call back.  ?

## 2021-06-30 NOTE — Telephone Encounter (Signed)
Pt check up on her referral for home health services. She says she got a call about it today but called [place] to make sure it was legit. Please call back ?

## 2021-07-01 DIAGNOSIS — J449 Chronic obstructive pulmonary disease, unspecified: Secondary | ICD-10-CM | POA: Diagnosis not present

## 2021-07-01 DIAGNOSIS — Z87442 Personal history of urinary calculi: Secondary | ICD-10-CM | POA: Diagnosis not present

## 2021-07-01 DIAGNOSIS — G629 Polyneuropathy, unspecified: Secondary | ICD-10-CM | POA: Diagnosis not present

## 2021-07-01 DIAGNOSIS — Z7982 Long term (current) use of aspirin: Secondary | ICD-10-CM | POA: Diagnosis not present

## 2021-07-01 DIAGNOSIS — Z87891 Personal history of nicotine dependence: Secondary | ICD-10-CM | POA: Diagnosis not present

## 2021-07-01 DIAGNOSIS — Z9049 Acquired absence of other specified parts of digestive tract: Secondary | ICD-10-CM | POA: Diagnosis not present

## 2021-07-01 DIAGNOSIS — G2581 Restless legs syndrome: Secondary | ICD-10-CM | POA: Diagnosis not present

## 2021-07-01 DIAGNOSIS — I5032 Chronic diastolic (congestive) heart failure: Secondary | ICD-10-CM | POA: Diagnosis not present

## 2021-07-01 DIAGNOSIS — F32A Depression, unspecified: Secondary | ICD-10-CM | POA: Diagnosis not present

## 2021-07-01 DIAGNOSIS — I130 Hypertensive heart and chronic kidney disease with heart failure and stage 1 through stage 4 chronic kidney disease, or unspecified chronic kidney disease: Secondary | ICD-10-CM | POA: Diagnosis not present

## 2021-07-01 DIAGNOSIS — M8580 Other specified disorders of bone density and structure, unspecified site: Secondary | ICD-10-CM | POA: Diagnosis not present

## 2021-07-01 DIAGNOSIS — N1831 Chronic kidney disease, stage 3a: Secondary | ICD-10-CM | POA: Diagnosis not present

## 2021-07-01 DIAGNOSIS — R7303 Prediabetes: Secondary | ICD-10-CM | POA: Diagnosis not present

## 2021-07-01 DIAGNOSIS — G20 Parkinson's disease: Secondary | ICD-10-CM | POA: Diagnosis not present

## 2021-07-01 NOTE — Telephone Encounter (Signed)
Attempted to contact - NA 

## 2021-07-01 NOTE — Telephone Encounter (Signed)
Please make patient aware

## 2021-07-03 ENCOUNTER — Ambulatory Visit (INDEPENDENT_AMBULATORY_CARE_PROVIDER_SITE_OTHER): Payer: Medicare Other | Admitting: Family Medicine

## 2021-07-03 DIAGNOSIS — B3731 Acute candidiasis of vulva and vagina: Secondary | ICD-10-CM | POA: Diagnosis not present

## 2021-07-03 MED ORDER — FLUCONAZOLE 150 MG PO TABS: 150.0000 mg | ORAL_TABLET | Freq: Once | ORAL | 0 refills | Status: AC

## 2021-07-03 NOTE — Progress Notes (Signed)
Telephone visit ? ?Subjective: ?CC: yeast infection ?PCP: Gwenlyn Fudge, FNP ?Allison Rollins is a 71 y.o. female calls for telephone consult today. Patient provides verbal consent for consult held via phone. ? ?Due to COVID-19 pandemic this visit was conducted virtually. This visit type was conducted due to national recommendations for restrictions regarding the COVID-19 Pandemic (e.g. social distancing, sheltering in place) in an effort to limit this patient's exposure and mitigate transmission in our community. All issues noted in this document were discussed and addressed.  A physical exam was not performed with this format.  ? ?Location of patient: home ?Location of provider: [facility] ?Others present for call: none ? ?1. Yeast vaginitis ?Patient reports that she has been taking [place] since 02/2021.  She reports that shew as reading the side effects and she has realized that she has had swelling in both of her vaginal lips.  Does not report any dark discoloration.  She does report cheesy white discharge from the vagina.  She is not sexually active and is a widow. ? ? ?ROS: Per HPI ? ?Allergies  ?Allergen Reactions  ? Penicillins Itching and Rash  ?  Has patient had a PCN reaction causing immediate rash, facial/tongue/throat swelling, SOB or lightheadedness with hypotension: no ?Has patient had a PCN reaction causing severe rash involving mucus membranes or skin necrosis: no ?Has patient had a PCN reaction that required hospitalization: no ?Has patient had a PCN reaction occurring within the last 10 years: no ?If all of the above answers are "NO", then may proceed with Cephalosporin use. ?  ? Sulfa Antibiotics Rash  ? ?Past Medical History:  ?Diagnosis Date  ? Anxiety   ? Asthma   ? Chest pain 12/22/2015  ? CHF (congestive heart failure) (HCC)   ? COPD (chronic obstructive pulmonary disease) (HCC)   ? Depression   ? Headache   ? HTN (hypertension)   ? Hypokalemia 07/28/2013  ? Kidney stones   ? Osteopenia  01/22/2020  ? Parkinson's disease   ? Pneumonia   ? Pre-diabetes   ? S/P colonoscopy 10/15/04  ? normal  ? Vitamin D deficiency 11/24/2018  ? ? ?Current Outpatient Medications:  ?  albuterol (PROVENTIL) (2.5 MG/3ML) 0.083% nebulizer solution, Take 3 mLs (2.5 mg total) by nebulization every 6 (six) hours as needed for wheezing or shortness of breath., Disp: 150 mL, Rfl: 1 ?  albuterol (VENTOLIN HFA) 108 (90 Base) MCG/ACT inhaler, Inhale 2 puffs into the lungs every 6 (six) hours as needed for wheezing or shortness of breath., Disp: 1 each, Rfl: 0 ?  Alcohol Swabs (B-D SINGLE USE SWABS REGULAR) PADS, Test sugars daily, Disp: 100 each, Rfl: 3 ?  aspirin EC 81 MG tablet, Take 1 tablet (81 mg total) by mouth daily., Disp: , Rfl:  ?  atorvastatin (LIPITOR) 40 MG tablet, Take 1 tablet (40 mg total) by mouth daily., Disp: 90 tablet, Rfl: 1 ?  cetirizine (ZYRTEC) 10 MG tablet, Take 1 tablet by mouth every day, Disp: 30 tablet, Rfl: 11 ?  cholecalciferol (VITAMIN D3) 25 MCG (1000 UT) tablet, Take 1,000 Units by mouth 2 (two) times daily. , Disp: , Rfl:  ?  dapagliflozin propanediol (FARXIGA) 10 MG TABS tablet, Take 1 tablet (10 mg total) by mouth daily before breakfast., Disp: 90 tablet, Rfl: 6 ?  famotidine (PEPCID) 20 MG tablet, Take 1 tablet by mouth at bedtime, Disp: 30 tablet, Rfl: 5 ?  fluticasone (FLONASE) 50 MCG/ACT nasal spray, Use 2 sprays into each  nostril every day, Disp: 16 g, Rfl: 5 ?  furosemide (LASIX) 20 MG tablet, Take 1 tablet (20 mg total) by mouth daily as needed., Disp: 90 tablet, Rfl: 1 ?  gabapentin (NEURONTIN) 300 MG capsule, Take 1 capsule in morning & 2 caps at bedtime, Disp: 90 capsule, Rfl: 11 ?  glucose blood (ACCU-CHEK AVIVA PLUS) test strip, Test sugars daily, Disp: 100 each, Rfl: 3 ?  lisinopril (ZESTRIL) 10 MG tablet, Take 1 tablet by mouth every day, Disp: 30 tablet, Rfl: 1 ?  methocarbamol (ROBAXIN) 500 MG tablet, Take 1 tablet by mouth 3 times a day as needed for muscle spasms, Disp: 30  tablet, Rfl: 11 ?  Multiple Vitamin (MULTIVITAMIN WITH MINERALS) TABS tablet, Take 1 tablet by mouth daily., Disp: , Rfl:  ?  nitroGLYCERIN (NITROSTAT) 0.4 MG SL tablet, Place 1 tablet (0.4 mg total) under the tongue every 5 (five) minutes as needed for chest pain., Disp: 20 tablet, Rfl: 3 ?  Omega-3 Fatty Acids (FISH OIL PO), Take 1 capsule by mouth daily. , Disp: , Rfl:  ?  pantoprazole (PROTONIX) 40 MG tablet, Take 1 tablet by mouth twice daily, Disp: 60 tablet, Rfl: 1 ?  potassium chloride SA (K-DUR) 20 MEQ tablet, Take 20 mEq by mouth daily as needed (with lasix)., Disp: , Rfl:  ?  pramipexole (MIRAPEX) 0.5 MG tablet, Take 1 tablet (0.5 mg total) by mouth at bedtime., Disp: 90 tablet, Rfl: 1 ?  sertraline (ZOLOFT) 100 MG tablet, Take 1.5 tablets by mouth every day, Disp: 135 tablet, Rfl: 1 ? ?Assessment/ Plan: ?71 y.o. female  ? ?Yeast vaginitis - Plan: fluconazole (DIFLUCAN) 150 MG tablet ? ?Certainly sound like she has a yeast infection which is likely related to the [place].  This appears to be on board for her renal dysfunction.  Uncertain if switching her over to [facility] or similar would make much of a difference in her symptomology.  Sounds like she may have a failure of this medication.  However, given the degree of swelling she is reporting I think it is pertinent that she has a vaginal exam and have set her up on Monday, which was the earliest appointment I could find for her to have this done.  She will start Diflucan and I would like her to take that every 3 days until the vaginitis resolves. ? ?Start time: 1:00pm ?End time: 1:11pm ? ?Total time spent on patient care (including telephone call/ virtual visit): 11 minutes ? ?Raliegh Ip, DO ?[facility] ?[phone] ? ?

## 2021-07-06 ENCOUNTER — Ambulatory Visit (INDEPENDENT_AMBULATORY_CARE_PROVIDER_SITE_OTHER): Payer: Medicare Other | Admitting: Family

## 2021-07-06 ENCOUNTER — Encounter: Payer: Self-pay | Admitting: Family

## 2021-07-06 VITALS — BP 153/102 | HR 80 | Temp 98.0°F | Ht 63.0 in | Wt 233.2 lb

## 2021-07-06 DIAGNOSIS — N898 Other specified noninflammatory disorders of vagina: Secondary | ICD-10-CM

## 2021-07-06 DIAGNOSIS — B3731 Acute candidiasis of vulva and vagina: Secondary | ICD-10-CM | POA: Diagnosis not present

## 2021-07-06 DIAGNOSIS — N3941 Urge incontinence: Secondary | ICD-10-CM

## 2021-07-06 DIAGNOSIS — B372 Candidiasis of skin and nail: Secondary | ICD-10-CM | POA: Diagnosis not present

## 2021-07-06 LAB — WET PREP FOR TRICH, YEAST, CLUE
Clue Cell Exam: NEGATIVE
Trichomonas Exam: NEGATIVE

## 2021-07-06 MED ORDER — FLUCONAZOLE 150 MG PO TABS: 150.0000 mg | ORAL_TABLET | ORAL | 0 refills | Status: DC | PRN

## 2021-07-06 MED ORDER — TERCONAZOLE 0.4 % VA CREA: 1.0000 | TOPICAL_CREAM | Freq: Every day | VAGINAL | 0 refills | Status: DC

## 2021-07-06 MED ORDER — NYSTATIN 100000 UNIT/GM EX POWD: 1.0000 "application " | Freq: Three times a day (TID) | CUTANEOUS | 0 refills | Status: DC

## 2021-07-06 NOTE — Patient Instructions (Signed)
Vaginal Yeast Infection, Adult °Vaginal yeast infection is a condition that causes vaginal discharge as well as soreness, swelling, and redness (inflammation) of the vagina. This is a common condition. Some women get this infection frequently. °What are the causes? °This condition is caused by a change in the normal balance of the yeast (Candida) and normal bacteria that live in the vagina. This change causes an overgrowth of yeast, which causes the inflammation. °What increases the risk? °The condition is more likely to develop in women who: °Take antibiotic medicines. °Have diabetes. °Take birth control pills. °Are pregnant. °Douche often. °Have a weak body defense system (immune system). °Have been taking steroid medicines for a long time. °Frequently wear tight clothing. °What are the signs or symptoms? °Symptoms of this condition include: °White, thick, creamy vaginal discharge. °Swelling, itching, redness, and irritation of the vagina. The lips of the vagina (labia) may be affected as well. °Pain or a burning feeling while urinating. °Pain during sex. °How is this diagnosed? °This condition is diagnosed based on: °Your medical history. °A physical exam. °A pelvic exam. Your health care provider will examine a sample of your vaginal discharge under a microscope. Your health care provider may send this sample for testing to confirm the diagnosis. °How is this treated? °This condition is treated with medicine. Medicines may be over-the-counter or prescription. You may be told to use one or more of the following: °Medicine that is taken by mouth (orally). °Medicine that is applied as a cream (topically). °Medicine that is inserted directly into the vagina (suppository). °Follow these instructions at home: °Take or apply over-the-counter and prescription medicines only as told by your health care provider. °Do not use tampons until your health care provider approves. °Do not have sex until your infection has  cleared. Sex can prolong or worsen your symptoms of infection. Ask your health care provider when it is safe to resume sexual activity. °Keep all follow-up visits. This is important. °How is this prevented? ° °Do not wear tight clothes, such as pantyhose or tight pants. °Wear breathable cotton underwear. °Do not use douches, perfumed soap, creams, or powders. °Wipe from front to back after using the toilet. °If you have diabetes, keep your blood sugar levels under control. °Ask your health care provider for other ways to prevent yeast infections. °Contact a health care provider if: °You have a fever. °Your symptoms go away and then return. °Your symptoms do not get better with treatment. °Your symptoms get worse. °You have new symptoms. °You develop blisters in or around your vagina. °You have blood coming from your vagina and it is not your menstrual period. °You develop pain in your abdomen. °Summary °Vaginal yeast infection is a condition that causes discharge as well as soreness, swelling, and redness (inflammation) of the vagina. °This condition is treated with medicine. Medicines may be over-the-counter or prescription. °Take or apply over-the-counter and prescription medicines only as told by your health care provider. °Do not douche. Resume sexual activity or use of tampons as instructed by your health care provider. °Contact a health care provider if your symptoms do not get better with treatment or your symptoms go away and then return. °This information is not intended to replace advice given to you by your health care provider. Make sure you discuss any questions you have with your health care provider. °Document Revised: 06/30/2020 Document Reviewed: 06/30/2020 °Elsevier Patient Education © 2022 [employer] ° °

## 2021-07-06 NOTE — Progress Notes (Signed)
? ?  Subjective:  ? ? Patient ID: Allison Rollins, female    DOB: 1950-10-16, 71 y.o.   MRN: [id number] ? ?Chief Complaint  ?Patient presents with  ? Vaginal Itching  ? ?PT presents to the office today to recheck vaginal yeast. She did a telephone Dr. Lajuana Ripple and was started on Diflucan every 3 day with #4.  ? ?She is incontinent and wears a depend all day.   ?Vaginal Itching ?The patient's primary symptoms include genital itching. The patient's pertinent negatives include no genital odor, vaginal bleeding or vaginal discharge. This is a new problem. She has tried antifungals for the symptoms. The treatment provided mild relief.  ? ? ? ?Review of Systems  ?Genitourinary:  Negative for vaginal discharge.  ?All other systems reviewed and are negative. ? ?   ?Objective:  ? Physical Exam ?Vitals reviewed.  ?Constitutional:   ?   General: She is not in acute distress. ?   Appearance: She is well-developed. She is obese.  ?HENT:  ?   Head: Normocephalic and atraumatic.  ?Eyes:  ?   Pupils: Pupils are equal, round, and reactive to light.  ?Neck:  ?   Thyroid: No thyromegaly.  ?Cardiovascular:  ?   Rate and Rhythm: Normal rate and regular rhythm.  ?   Heart sounds: Normal heart sounds. No murmur heard. ?Pulmonary:  ?   Effort: Pulmonary effort is normal. No respiratory distress.  ?   Breath sounds: Normal breath sounds. No wheezing.  ?Abdominal:  ?   General: Bowel sounds are normal. There is no distension.  ?   Palpations: Abdomen is soft.  ?   Tenderness: There is no abdominal tenderness.  ?Musculoskeletal:     ?   General: No tenderness. Normal range of motion.  ?   Cervical back: Normal range of motion and neck supple.  ?Skin: ?   General: Skin is warm and dry.  ?   Findings: Rash present.  ?   Comments: Vaginal discoloration and irration   ?Neurological:  ?   Mental Status: She is alert and oriented to person, place, and time.  ?   Cranial Nerves: No cranial nerve deficit.  ?   Motor: Weakness present.  ?   Gait: Gait  abnormal.  ?   Deep Tendon Reflexes: Reflexes are normal and symmetric.  ?Psychiatric:     ?   Behavior: Behavior normal.     ?   Thought Content: Thought content normal.     ?   Judgment: Judgment normal.  ? ? ?BP (!) 153/102   Pulse 80   Temp 98 ?F (36.7 ?C) (Temporal)   Ht 5\' 3"  (1.6 m)   Wt 233 lb 3.2 oz (105.8 kg)   BMI 41.31 kg/m?  ? ? ? ?   ?Assessment & Plan:  ?Allison Rollins comes in today with chief complaint of Vaginal Itching ? ? ?Diagnosis and orders addressed: ? ?1. Vagina, candidiasis ?- terconazole (TERAZOL 7) 0.4 % vaginal cream; Place 1 applicator vaginally at bedtime.  Dispense: 45 g; Refill: 0 ?- WET PREP FOR TRICH, YEAST, CLUE ? ?2. Urge incontinence ? ?3. Skin candidiasis ?- nystatin (MYCOSTATIN/NYSTOP) powder; Apply 1 application. topically 3 (three) times daily.  Dispense: 15 g; Refill: 0 ? ?4. Vagina itching ?- WET PREP FOR TRICH, YEAST, CLUE ? ? ?Keep skin clean and dry ?Nystatin powder in groin ?Diflucan as needed for yeast ? ? ?Evelina Dun, FNP ? ? ? ?

## 2021-07-06 NOTE — Telephone Encounter (Signed)
Attempted to contact- NA ?This encounter will be closed  ?

## 2021-07-07 ENCOUNTER — Other Ambulatory Visit: Payer: Self-pay | Admitting: Family Medicine

## 2021-07-07 DIAGNOSIS — F339 Major depressive disorder, recurrent, unspecified: Secondary | ICD-10-CM

## 2021-07-07 DIAGNOSIS — F411 Generalized anxiety disorder: Secondary | ICD-10-CM

## 2021-07-07 DIAGNOSIS — J301 Allergic rhinitis due to pollen: Secondary | ICD-10-CM

## 2021-07-09 DIAGNOSIS — G20 Parkinson's disease: Secondary | ICD-10-CM | POA: Diagnosis not present

## 2021-07-09 DIAGNOSIS — G2581 Restless legs syndrome: Secondary | ICD-10-CM | POA: Diagnosis not present

## 2021-07-09 DIAGNOSIS — N1831 Chronic kidney disease, stage 3a: Secondary | ICD-10-CM | POA: Diagnosis not present

## 2021-07-09 DIAGNOSIS — Z87442 Personal history of urinary calculi: Secondary | ICD-10-CM | POA: Diagnosis not present

## 2021-07-09 DIAGNOSIS — G629 Polyneuropathy, unspecified: Secondary | ICD-10-CM | POA: Diagnosis not present

## 2021-07-09 DIAGNOSIS — Z9049 Acquired absence of other specified parts of digestive tract: Secondary | ICD-10-CM | POA: Diagnosis not present

## 2021-07-09 DIAGNOSIS — I5032 Chronic diastolic (congestive) heart failure: Secondary | ICD-10-CM | POA: Diagnosis not present

## 2021-07-09 DIAGNOSIS — I130 Hypertensive heart and chronic kidney disease with heart failure and stage 1 through stage 4 chronic kidney disease, or unspecified chronic kidney disease: Secondary | ICD-10-CM | POA: Diagnosis not present

## 2021-07-09 DIAGNOSIS — R7303 Prediabetes: Secondary | ICD-10-CM | POA: Diagnosis not present

## 2021-07-09 DIAGNOSIS — M8580 Other specified disorders of bone density and structure, unspecified site: Secondary | ICD-10-CM | POA: Diagnosis not present

## 2021-07-09 DIAGNOSIS — F32A Depression, unspecified: Secondary | ICD-10-CM | POA: Diagnosis not present

## 2021-07-09 DIAGNOSIS — Z87891 Personal history of nicotine dependence: Secondary | ICD-10-CM | POA: Diagnosis not present

## 2021-07-09 DIAGNOSIS — J449 Chronic obstructive pulmonary disease, unspecified: Secondary | ICD-10-CM | POA: Diagnosis not present

## 2021-07-09 DIAGNOSIS — Z7982 Long term (current) use of aspirin: Secondary | ICD-10-CM | POA: Diagnosis not present

## 2021-07-10 ENCOUNTER — Telehealth: Payer: Self-pay | Admitting: Family Medicine

## 2021-07-10 NOTE — Telephone Encounter (Signed)
Does she wipe with wet wipes when she goes to the bathroom? This is often helpful in wiping away sugar that can cause yeast infections if left on the skin.  ?

## 2021-07-10 NOTE — Telephone Encounter (Signed)
Patient states that she uses wipes with aloe in them when she goes to the bathroom.  States that the outside out her vaginal area is swelling.  Please advise if she should keep or stop taking medication? ?

## 2021-07-10 NOTE — Telephone Encounter (Signed)
Allison Rollins states she said that this is possible to cause the yeast would like pcp to decide to continue or not. Please advise ?

## 2021-07-13 NOTE — Telephone Encounter (Signed)
She can stop it and we will discuss further when she comes next month for her appointment.  ?

## 2021-07-13 NOTE — Telephone Encounter (Signed)
Patient aware and verbalized understanding. °

## 2021-07-15 ENCOUNTER — Other Ambulatory Visit: Payer: Self-pay

## 2021-07-15 ENCOUNTER — Ambulatory Visit (INDEPENDENT_AMBULATORY_CARE_PROVIDER_SITE_OTHER): Payer: Medicare Other

## 2021-07-15 DIAGNOSIS — I130 Hypertensive heart and chronic kidney disease with heart failure and stage 1 through stage 4 chronic kidney disease, or unspecified chronic kidney disease: Secondary | ICD-10-CM | POA: Diagnosis not present

## 2021-07-15 DIAGNOSIS — F32A Depression, unspecified: Secondary | ICD-10-CM | POA: Diagnosis not present

## 2021-07-15 DIAGNOSIS — R7303 Prediabetes: Secondary | ICD-10-CM | POA: Diagnosis not present

## 2021-07-15 DIAGNOSIS — G20 Parkinson's disease: Secondary | ICD-10-CM

## 2021-07-15 DIAGNOSIS — Z9049 Acquired absence of other specified parts of digestive tract: Secondary | ICD-10-CM

## 2021-07-15 DIAGNOSIS — N1831 Chronic kidney disease, stage 3a: Secondary | ICD-10-CM | POA: Diagnosis not present

## 2021-07-15 DIAGNOSIS — M8580 Other specified disorders of bone density and structure, unspecified site: Secondary | ICD-10-CM

## 2021-07-15 DIAGNOSIS — I5032 Chronic diastolic (congestive) heart failure: Secondary | ICD-10-CM | POA: Diagnosis not present

## 2021-07-15 DIAGNOSIS — F419 Anxiety disorder, unspecified: Secondary | ICD-10-CM

## 2021-07-17 DIAGNOSIS — J449 Chronic obstructive pulmonary disease, unspecified: Secondary | ICD-10-CM | POA: Diagnosis not present

## 2021-07-17 DIAGNOSIS — F32A Depression, unspecified: Secondary | ICD-10-CM | POA: Diagnosis not present

## 2021-07-17 DIAGNOSIS — I5032 Chronic diastolic (congestive) heart failure: Secondary | ICD-10-CM | POA: Diagnosis not present

## 2021-07-17 DIAGNOSIS — I130 Hypertensive heart and chronic kidney disease with heart failure and stage 1 through stage 4 chronic kidney disease, or unspecified chronic kidney disease: Secondary | ICD-10-CM | POA: Diagnosis not present

## 2021-07-17 DIAGNOSIS — G20 Parkinson's disease: Secondary | ICD-10-CM | POA: Diagnosis not present

## 2021-07-17 DIAGNOSIS — Z87442 Personal history of urinary calculi: Secondary | ICD-10-CM | POA: Diagnosis not present

## 2021-07-17 DIAGNOSIS — M8580 Other specified disorders of bone density and structure, unspecified site: Secondary | ICD-10-CM | POA: Diagnosis not present

## 2021-07-17 DIAGNOSIS — Z7982 Long term (current) use of aspirin: Secondary | ICD-10-CM | POA: Diagnosis not present

## 2021-07-17 DIAGNOSIS — G2581 Restless legs syndrome: Secondary | ICD-10-CM | POA: Diagnosis not present

## 2021-07-17 DIAGNOSIS — Z9049 Acquired absence of other specified parts of digestive tract: Secondary | ICD-10-CM | POA: Diagnosis not present

## 2021-07-17 DIAGNOSIS — R7303 Prediabetes: Secondary | ICD-10-CM | POA: Diagnosis not present

## 2021-07-17 DIAGNOSIS — G629 Polyneuropathy, unspecified: Secondary | ICD-10-CM | POA: Diagnosis not present

## 2021-07-17 DIAGNOSIS — N1831 Chronic kidney disease, stage 3a: Secondary | ICD-10-CM | POA: Diagnosis not present

## 2021-07-17 DIAGNOSIS — Z87891 Personal history of nicotine dependence: Secondary | ICD-10-CM | POA: Diagnosis not present

## 2021-07-30 ENCOUNTER — Ambulatory Visit (INDEPENDENT_AMBULATORY_CARE_PROVIDER_SITE_OTHER): Payer: Medicare Other | Admitting: Family Medicine

## 2021-07-30 ENCOUNTER — Encounter: Payer: Self-pay | Admitting: Family Medicine

## 2021-07-30 VITALS — BP 131/80 | HR 60 | Temp 96.7°F | Ht 63.0 in | Wt 233.2 lb

## 2021-07-30 DIAGNOSIS — R7303 Prediabetes: Secondary | ICD-10-CM | POA: Diagnosis not present

## 2021-07-30 DIAGNOSIS — E782 Mixed hyperlipidemia: Secondary | ICD-10-CM | POA: Diagnosis not present

## 2021-07-30 DIAGNOSIS — K2100 Gastro-esophageal reflux disease with esophagitis, without bleeding: Secondary | ICD-10-CM

## 2021-07-30 DIAGNOSIS — G629 Polyneuropathy, unspecified: Secondary | ICD-10-CM

## 2021-07-30 DIAGNOSIS — I5032 Chronic diastolic (congestive) heart failure: Secondary | ICD-10-CM | POA: Diagnosis not present

## 2021-07-30 DIAGNOSIS — E785 Hyperlipidemia, unspecified: Secondary | ICD-10-CM

## 2021-07-30 DIAGNOSIS — N1831 Chronic kidney disease, stage 3a: Secondary | ICD-10-CM | POA: Diagnosis not present

## 2021-07-30 DIAGNOSIS — F411 Generalized anxiety disorder: Secondary | ICD-10-CM

## 2021-07-30 DIAGNOSIS — B372 Candidiasis of skin and nail: Secondary | ICD-10-CM

## 2021-07-30 DIAGNOSIS — E1169 Type 2 diabetes mellitus with other specified complication: Secondary | ICD-10-CM | POA: Diagnosis not present

## 2021-07-30 DIAGNOSIS — G2581 Restless legs syndrome: Secondary | ICD-10-CM

## 2021-07-30 DIAGNOSIS — M545 Low back pain, unspecified: Secondary | ICD-10-CM | POA: Diagnosis not present

## 2021-07-30 DIAGNOSIS — I10 Essential (primary) hypertension: Secondary | ICD-10-CM | POA: Diagnosis not present

## 2021-07-30 DIAGNOSIS — E669 Obesity, unspecified: Secondary | ICD-10-CM

## 2021-07-30 DIAGNOSIS — G8929 Other chronic pain: Secondary | ICD-10-CM

## 2021-07-30 DIAGNOSIS — F339 Major depressive disorder, recurrent, unspecified: Secondary | ICD-10-CM

## 2021-07-30 LAB — BAYER DCA HB A1C WAIVED: HB A1C (BAYER DCA - WAIVED): 5.6 % (ref 4.8–5.6)

## 2021-07-30 MED ORDER — EMPAGLIFLOZIN 10 MG PO TABS: 10.0000 mg | ORAL_TABLET | Freq: Every day | ORAL | 0 refills | Status: DC

## 2021-07-30 MED ORDER — PANTOPRAZOLE SODIUM 40 MG PO TBEC: 40.0000 mg | DELAYED_RELEASE_TABLET | Freq: Two times a day (BID) | ORAL | 1 refills | Status: DC

## 2021-07-30 MED ORDER — ATORVASTATIN CALCIUM 40 MG PO TABS: 40.0000 mg | ORAL_TABLET | Freq: Every day | ORAL | 1 refills | Status: DC

## 2021-07-30 MED ORDER — PRAMIPEXOLE DIHYDROCHLORIDE 0.5 MG PO TABS: 0.5000 mg | ORAL_TABLET | Freq: Every day | ORAL | 1 refills | Status: DC

## 2021-07-30 MED ORDER — GABAPENTIN 300 MG PO CAPS: 600.0000 mg | ORAL_CAPSULE | Freq: Three times a day (TID) | ORAL | 1 refills | Status: DC

## 2021-07-30 MED ORDER — NYSTATIN 100000 UNIT/GM EX POWD: 1.0000 "application " | Freq: Three times a day (TID) | CUTANEOUS | 1 refills | Status: DC

## 2021-07-30 MED ORDER — LISINOPRIL 10 MG PO TABS: 10.0000 mg | ORAL_TABLET | Freq: Every day | ORAL | 1 refills | Status: DC

## 2021-07-30 NOTE — Progress Notes (Signed)
? ?Assessment & Plan:  ?1. Essential hypertension ?Well controlled on current regimen.  ?- lisinopril (ZESTRIL) 10 MG tablet; Take 1 tablet (10 mg total) by mouth daily.  Dispense: 90 tablet; Refill: 1 ?- CBC with Differential/Platelet ?- CMP14+EGFR ?- Lipid panel ? ?2. Chronic diastolic heart failure ([place]) ?Well controlled on current regimen.  ?- CBC with Differential/Platelet ?- CMP14+EGFR ?- Lipid panel ?- empagliflozin (JARDIANCE) 10 MG TABS tablet; Take 1 tablet (10 mg total) by mouth daily before breakfast.  Dispense: 90 tablet; Refill: 0 ? ?3. Mixed hyperlipidemia ?Well controlled on current regimen.  ?- atorvastatin (LIPITOR) 40 MG tablet; Take 1 tablet (40 mg total) by mouth daily.  Dispense: 90 tablet; Refill: 1 ?- CMP14+EGFR ?- Lipid panel ? ?4. Prediabetes ?- Bayer DCA Hb A1c Waived ? ?5. Stage 3a chronic kidney disease ([place]) ?Start Jardiance to see if this is tolerable since [place] was not.  ?- CMP14+EGFR ?- empagliflozin (JARDIANCE) 10 MG TABS tablet; Take 1 tablet (10 mg total) by mouth daily before breakfast.  Dispense: 90 tablet; Refill: 0 ? ?6. Gastroesophageal reflux disease with esophagitis, unspecified whether hemorrhage ?Well controlled on current regimen.  ?- pantoprazole (PROTONIX) 40 MG tablet; Take 1 tablet (40 mg total) by mouth 2 (two) times daily.  Dispense: 180 tablet; Refill: 1 ?- CMP14+EGFR ? ?7. Restless leg ?Well controlled on current regimen.  ?- pramipexole (MIRAPEX) 0.5 MG tablet; Take 1 tablet (0.5 mg total) by mouth at bedtime.  Dispense: 90 tablet; Refill: 1 ?- CMP14+EGFR ? ?8. Chronic right-sided low back pain without sciatica ?Uncontrolled. Gabapentin increased to 600 mg TID. Patient given instructions to increase by one tablet every 3-5 days.  ?- CMP14+EGFR ?- gabapentin (NEURONTIN) 300 MG capsule; Take 2 capsules (600 mg total) by mouth 3 (three) times daily.  Dispense: 540 capsule; Refill: 1 ? ?9. Neuropathy ?Uncontrolled. Gabapentin increased to 600 mg TID. Patient  given instructions to increase by one tablet every 3-5 days.  ?- CMP14+EGFR ?- gabapentin (NEURONTIN) 300 MG capsule; Take 2 capsules (600 mg total) by mouth 3 (three) times daily.  Dispense: 540 capsule; Refill: 1 ? ?10. Generalized anxiety disorder ?Well controlled on current regimen.  ?- CMP14+EGFR ? ?11. Recurrent depression ([place]) ?Well controlled on current regimen.  ?- CMP14+EGFR ? ?12. Obesity (BMI 30-39.9) ?Encouraged healthy eating and exercise. ?- CBC with Differential/Platelet ?- CMP14+EGFR ?- Lipid panel ? ?13. Skin candidiasis ?- nystatin (MYCOSTATIN/NYSTOP) powder; Apply 1 application. topically 3 (three) times daily.  Dispense: 60 g; Refill: 1 ? ? ?Return in about 3 months (around 10/29/2021) for follow-up of chronic medication conditions. ? ?[name], MSN, APRN, FNP-C ?[facility] ? ?Subjective:  ? ? Patient ID: Allison Rollins, female    DOB: Jun 23, 1950, 71 y.o.   MRN: [id number] ? ?Patient Care Team: ?Loman Brooklyn, FNP as PCP - General (Family Medicine) ?Arnoldo Lenis, MD as PCP - Cardiology (Cardiology) ?Fields, Marga Melnick, MD (Inactive) (Gastroenterology) ?Phillips Odor, MD as Consulting Physician (Neurology) ?Arnoldo Lenis, MD as Consulting Physician (Cardiology) ?Ilean China, RN as [facility]  ? ?Chief Complaint:  ?Chief Complaint  ?Patient presents with  ? Medical Management of Chronic Issues  ? Back Pain  ?  Ongoing right sided lower back pain   ? ? ?HPI: ?Allison Rollins is a 71 y.o. female presenting on 07/30/2021 for Medical Management of Chronic Issues and Back Pain (Ongoing right sided lower back pain ) ? ?Depression/anxiety: Patient feels she is well controlled  with sertraline.   ? ? ?  06/18/2021  ? 10:17 AM 04/03/2021  ?  2:30 PM 03/02/2021  ?  4:10 PM  ?Depression screen PHQ 2/9  ?Decreased Interest 1 0 0  ?Down, Depressed, Hopeless 2 0 0  ?PHQ - 2 Score 3 0 0  ?Altered sleeping 1 1 1   ?Tired, decreased energy 2 1 1    ?Change in appetite 1 0 0  ?Feeling bad or failure about yourself  1 0 0  ?Trouble concentrating 1 0 0  ?Moving slowly or fidgety/restless 1 3 0  ?Suicidal thoughts 0 0 0  ?PHQ-9 Score 10 5 2   ?Difficult doing work/chores Not difficult at all Not difficult at all Somewhat difficult  ? ? ?  06/18/2021  ? 10:17 AM 04/03/2021  ?  2:30 PM 01/01/2021  ?  1:47 PM 09/10/2020  ?  3:34 PM  ?GAD 7 : Generalized Anxiety Score  ?Nervous, Anxious, on Edge 1 1 2 2   ?Control/stop worrying 1 2 1 1   ?Worry too much - different things 2 1 1 2   ?Trouble relaxing 2 1 1  0  ?Restless 1 1 1 2   ?Easily annoyed or irritable 2 1 1 1   ?Afraid - awful might happen 0 1 0 0  ?Total GAD 7 Score 9 8 7 8   ?Anxiety Difficulty Extremely difficult Not difficult at all Not difficult at all Not difficult at all  ? ? ?Difficulty sleeping: Patient reports she is taking ZzzQuil at bedtime which is effective. ? ?Prediabetes: Most recent A1c 5.6 4 months ago. ? ?Vitamin D deficiency: Well-controlled with vitamin D supplement of 1,000 units twice daily. ? ?Previously taking [place] due to CKD and HF.  She had to stop the medication due to vaginal yeast infections with vaginal swelling. ? ?GERD: Doing well with Protonix 40 mg once daily and famotidine 20 mg at bedtime. ? ?New complaints: ?Patient reports chronic right sided back pain that started five years ago. She has had a MRI that showed spinal stenosis from disc bulging and degenerative changes with impingement of L5. She declined then and now any surgery. Gabapentin is helpful in treating the pain, she is currently taking 300 mg in the morning and 600 mg in the evening.  ? ? ?Social history: ? ?Relevant past medical, surgical, family and social history reviewed and updated as indicated. Interim medical history since our last visit reviewed. ? ?Allergies and medications reviewed and updated. ? ?DATA REVIEWED: CHART IN EPIC ? ?ROS: Negative unless specifically indicated above in HPI.  ? ? ?Current Outpatient  Medications:  ?  albuterol (PROVENTIL) (2.5 MG/3ML) 0.083% nebulizer solution, Take 3 mLs (2.5 mg total) by nebulization every 6 (six) hours as needed for wheezing or shortness of breath., Disp: 150 mL, Rfl: 1 ?  albuterol (VENTOLIN HFA) 108 (90 Base) MCG/ACT inhaler, Inhale 2 puffs into the lungs every 6 (six) hours as needed for wheezing or shortness of breath., Disp: 1 each, Rfl: 0 ?  Alcohol Swabs (B-D SINGLE USE SWABS REGULAR) PADS, Test sugars daily, Disp: 100 each, Rfl: 3 ?  aspirin EC 81 MG tablet, Take 1 tablet (81 mg total) by mouth daily., Disp: , Rfl:  ?  atorvastatin (LIPITOR) 40 MG tablet, Take 1 tablet (40 mg total) by mouth daily., Disp: 90 tablet, Rfl: 1 ?  cetirizine (ZYRTEC) 10 MG tablet, Take 1 tablet by mouth every day, Disp: 30 tablet, Rfl: 11 ?  cholecalciferol (VITAMIN D3) 25 MCG (1000 UT) tablet, Take 1,000 Units  by mouth 2 (two) times daily. , Disp: , Rfl:  ?  famotidine (PEPCID) 20 MG tablet, Take 1 tablet by mouth at bedtime, Disp: 30 tablet, Rfl: 5 ?  fluticasone (FLONASE) 50 MCG/ACT nasal spray, Use 2 sprays into each nostril every day, Disp: 16 g, Rfl: 5 ?  furosemide (LASIX) 20 MG tablet, Take 1 tablet (20 mg total) by mouth daily as needed., Disp: 90 tablet, Rfl: 1 ?  gabapentin (NEURONTIN) 300 MG capsule, Take 1 capsule in morning & 2 caps at bedtime, Disp: 90 capsule, Rfl: 11 ?  glucose blood (ACCU-CHEK AVIVA PLUS) test strip, Test sugars daily, Disp: 100 each, Rfl: 3 ?  lisinopril (ZESTRIL) 10 MG tablet, Take 1 tablet by mouth every day, Disp: 30 tablet, Rfl: 1 ?  methocarbamol (ROBAXIN) 500 MG tablet, Take 1 tablet by mouth 3 times a day as needed for muscle spasms, Disp: 30 tablet, Rfl: 11 ?  Multiple Vitamin (MULTIVITAMIN WITH MINERALS) TABS tablet, Take 1 tablet by mouth daily., Disp: , Rfl:  ?  nitroGLYCERIN (NITROSTAT) 0.4 MG SL tablet, Place 1 tablet (0.4 mg total) under the tongue every 5 (five) minutes as needed for chest pain., Disp: 20 tablet, Rfl: 3 ?  nystatin  (MYCOSTATIN/NYSTOP) powder, Apply 1 application. topically 3 (three) times daily., Disp: 15 g, Rfl: 0 ?  Omega-3 Fatty Acids (FISH OIL PO), Take 1 capsule by mouth daily. , Disp: , Rfl:  ?  pantoprazole (PROTONIX) 43 M

## 2021-07-30 NOTE — Patient Instructions (Signed)
Gabapentin: ? ? 8 AM 2 PM Bedtime  ?After 3-5 days 1 tablet 1 tablet 2 tablet  ?After 3-5 days 1 tablet 1 tablet 2 tablets  ?After 3-5 days 2 tablets 1 tablet 2 tablets  ?After 3-5 days 2 tablets 2 tablets 2 tablets  ? ?

## 2021-08-03 ENCOUNTER — Telehealth: Payer: Self-pay | Admitting: Family Medicine

## 2021-08-03 NOTE — Telephone Encounter (Signed)
Busy

## 2021-08-03 NOTE — Telephone Encounter (Signed)
Please review labs. 

## 2021-08-04 ENCOUNTER — Telehealth: Payer: Self-pay | Admitting: Family Medicine

## 2021-08-04 NOTE — Telephone Encounter (Signed)
Labs are stable. LDL cholesterol is slightly elevated. Continue atorvastatin. Also recommend heart healthy diet and exercise.  ?

## 2021-08-04 NOTE — Telephone Encounter (Signed)
Patient informed of lab results. 

## 2021-08-04 NOTE — Telephone Encounter (Signed)
Patient informed of lab results. [facility]  ?

## 2021-08-04 NOTE — Telephone Encounter (Signed)
LM to [facility]  

## 2021-08-04 NOTE — Telephone Encounter (Signed)
Calling about lab results. Please call back.  ?

## 2021-08-07 ENCOUNTER — Encounter: Payer: Self-pay | Admitting: Family Medicine

## 2021-08-07 LAB — LIPID PANEL
Chol/HDL Ratio: 3.7 ratio (ref 0.0–4.4)
Cholesterol, Total: 172 mg/dL (ref 100–199)
HDL: 47 mg/dL (ref >39)
LDL Chol Calc (NIH): 100 mg/dL — ABNORMAL HIGH (ref 0–99)
Triglycerides: 143 mg/dL (ref 0–149)
VLDL Cholesterol Cal: 25 mg/dL (ref 5–40)

## 2021-08-07 LAB — CBC WITH DIFFERENTIAL/PLATELET
Basophils Absolute: 0 10*3/uL (ref 0.0–0.2)
Basos: 0 %
EOS (ABSOLUTE): 0.2 10*3/uL (ref 0.0–0.4)
Eos: 2 %
Hematocrit: 43.7 % (ref 34.0–46.6)
Hemoglobin: 14.4 g/dL (ref 11.1–15.9)
Immature Grans (Abs): 0 10*3/uL (ref 0.0–0.1)
Immature Granulocytes: 0 %
Lymphocytes Absolute: 1.2 10*3/uL (ref 0.7–3.1)
Lymphs: 16 %
MCH: 29.1 pg (ref 26.6–33.0)
MCHC: 33 g/dL (ref 31.5–35.7)
MCV: 89 fL (ref 79–97)
Monocytes Absolute: 0.5 10*3/uL (ref 0.1–0.9)
Monocytes: 7 %
Neutrophils Absolute: 5.7 10*3/uL (ref 1.4–7.0)
Neutrophils: 75 %
Platelets: 219 10*3/uL (ref 150–450)
RBC: 4.94 x10E6/uL (ref 3.77–5.28)
RDW: 13.5 % (ref 11.7–15.4)
WBC: 7.7 10*3/uL (ref 3.4–10.8)

## 2021-08-07 LAB — CMP14+EGFR
ALT: 17 IU/L (ref 0–32)
AST: 22 IU/L (ref 0–40)
Albumin/Globulin Ratio: 2 (ref 1.2–2.2)
Albumin: 4.2 g/dL (ref 3.7–4.7)
Alkaline Phosphatase: 111 IU/L (ref 44–121)
BUN/Creatinine Ratio: 10 — ABNORMAL LOW (ref 12–28)
BUN: 11 mg/dL (ref 8–27)
Bilirubin Total: 0.6 mg/dL (ref 0.0–1.2)
CO2: 27 mmol/L (ref 20–29)
Calcium: 9.5 mg/dL (ref 8.7–10.3)
Chloride: 104 mmol/L (ref 96–106)
Creatinine, Ser: 1.06 mg/dL — ABNORMAL HIGH (ref 0.57–1.00)
Globulin, Total: 2.1 g/dL (ref 1.5–4.5)
Glucose: 90 mg/dL (ref 70–99)
Potassium: 4.8 mmol/L (ref 3.5–5.2)
Sodium: 146 mmol/L — ABNORMAL HIGH (ref 134–144)
Total Protein: 6.3 g/dL (ref 6.0–8.5)
eGFR: 56 mL/min/{1.73_m2} — ABNORMAL LOW (ref >59)

## 2021-08-22 ENCOUNTER — Emergency Department (HOSPITAL_COMMUNITY)
Admission: EM | Admit: 2021-08-22 | Discharge: 2021-08-23 | Disposition: A | Discharge status: Home / Self Care | Payer: Medicare Other | Attending: Student | Admitting: Student

## 2021-08-22 ENCOUNTER — Other Ambulatory Visit: Payer: Self-pay

## 2021-08-22 ENCOUNTER — Encounter (HOSPITAL_COMMUNITY): Payer: Self-pay

## 2021-08-22 ENCOUNTER — Emergency Department (HOSPITAL_COMMUNITY): Payer: Medicare Other

## 2021-08-22 DIAGNOSIS — R531 Weakness: Secondary | ICD-10-CM | POA: Insufficient documentation

## 2021-08-22 DIAGNOSIS — J449 Chronic obstructive pulmonary disease, unspecified: Secondary | ICD-10-CM | POA: Diagnosis not present

## 2021-08-22 DIAGNOSIS — R079 Chest pain, unspecified: Secondary | ICD-10-CM | POA: Diagnosis not present

## 2021-08-22 DIAGNOSIS — R072 Precordial pain: Secondary | ICD-10-CM | POA: Insufficient documentation

## 2021-08-22 DIAGNOSIS — G20 Parkinson's disease: Secondary | ICD-10-CM | POA: Diagnosis not present

## 2021-08-22 DIAGNOSIS — I130 Hypertensive heart and chronic kidney disease with heart failure and stage 1 through stage 4 chronic kidney disease, or unspecified chronic kidney disease: Secondary | ICD-10-CM | POA: Diagnosis not present

## 2021-08-22 DIAGNOSIS — Z7982 Long term (current) use of aspirin: Secondary | ICD-10-CM | POA: Diagnosis not present

## 2021-08-22 DIAGNOSIS — Z7951 Long term (current) use of inhaled steroids: Secondary | ICD-10-CM | POA: Insufficient documentation

## 2021-08-22 DIAGNOSIS — Z79899 Other long term (current) drug therapy: Secondary | ICD-10-CM | POA: Diagnosis not present

## 2021-08-22 DIAGNOSIS — R0789 Other chest pain: Secondary | ICD-10-CM | POA: Diagnosis not present

## 2021-08-22 DIAGNOSIS — I5032 Chronic diastolic (congestive) heart failure: Secondary | ICD-10-CM | POA: Diagnosis not present

## 2021-08-22 DIAGNOSIS — R55 Syncope and collapse: Secondary | ICD-10-CM | POA: Insufficient documentation

## 2021-08-22 DIAGNOSIS — N1831 Chronic kidney disease, stage 3a: Secondary | ICD-10-CM | POA: Diagnosis not present

## 2021-08-22 DIAGNOSIS — I491 Atrial premature depolarization: Secondary | ICD-10-CM | POA: Diagnosis not present

## 2021-08-22 DIAGNOSIS — Z9049 Acquired absence of other specified parts of digestive tract: Secondary | ICD-10-CM | POA: Diagnosis not present

## 2021-08-22 DIAGNOSIS — Z87891 Personal history of nicotine dependence: Secondary | ICD-10-CM | POA: Insufficient documentation

## 2021-08-22 DIAGNOSIS — R231 Pallor: Secondary | ICD-10-CM | POA: Diagnosis not present

## 2021-08-22 DIAGNOSIS — R0689 Other abnormalities of breathing: Secondary | ICD-10-CM | POA: Diagnosis not present

## 2021-08-22 LAB — COMPREHENSIVE METABOLIC PANEL
ALT: 40 U/L (ref 0–44)
AST: 84 U/L — ABNORMAL HIGH (ref 15–41)
Albumin: 3.9 g/dL (ref 3.5–5.0)
Alkaline Phosphatase: 91 U/L (ref 38–126)
Anion gap: 14 (ref 5–15)
BUN: 16 mg/dL (ref 8–23)
CO2: 20 mmol/L — ABNORMAL LOW (ref 22–32)
Calcium: 8.5 mg/dL — ABNORMAL LOW (ref 8.9–10.3)
Chloride: 101 mmol/L (ref 98–111)
Creatinine, Ser: 1.01 mg/dL — ABNORMAL HIGH (ref 0.44–1.00)
GFR, Estimated: 60 mL/min — ABNORMAL LOW (ref >60)
Glucose, Bld: 91 mg/dL (ref 70–99)
Potassium: 3.1 mmol/L — ABNORMAL LOW (ref 3.5–5.1)
Sodium: 135 mmol/L (ref 135–145)
Total Bilirubin: 2.2 mg/dL — ABNORMAL HIGH (ref 0.3–1.2)
Total Protein: 6.8 g/dL (ref 6.5–8.1)

## 2021-08-22 LAB — CBC WITH DIFFERENTIAL/PLATELET
Abs Immature Granulocytes: 0.04 10*3/uL (ref 0.00–0.07)
Basophils Absolute: 0 10*3/uL (ref 0.0–0.1)
Basophils Relative: 0 %
Eosinophils Absolute: 0.1 10*3/uL (ref 0.0–0.5)
Eosinophils Relative: 1 %
HCT: 42.4 % (ref 36.0–46.0)
Hemoglobin: 13.7 g/dL (ref 12.0–15.0)
Immature Granulocytes: 0 %
Lymphocytes Relative: 5 %
Lymphs Abs: 0.6 10*3/uL — ABNORMAL LOW (ref 0.7–4.0)
MCH: 29.4 pg (ref 26.0–34.0)
MCHC: 32.3 g/dL (ref 30.0–36.0)
MCV: 91 fL (ref 80.0–100.0)
Monocytes Absolute: 0.7 10*3/uL (ref 0.1–1.0)
Monocytes Relative: 6 %
Neutro Abs: 10.6 10*3/uL — ABNORMAL HIGH (ref 1.7–7.7)
Neutrophils Relative %: 88 %
Platelets: 216 10*3/uL (ref 150–400)
RBC: 4.66 MIL/uL (ref 3.87–5.11)
RDW: 13.9 % (ref 11.5–15.5)
WBC: 12 10*3/uL — ABNORMAL HIGH (ref 4.0–10.5)
nRBC: 0 % (ref 0.0–0.2)

## 2021-08-22 LAB — TROPONIN I (HIGH SENSITIVITY): Troponin I (High Sensitivity): 17 ng/L (ref <18)

## 2021-08-22 MED ORDER — IOHEXOL 350 MG/ML SOLN
100.0000 mL | Freq: Once | INTRAVENOUS | Status: AC | PRN
  Administered 2021-08-22: 100 mL via INTRAVENOUS

## 2021-08-22 NOTE — ED Triage Notes (Signed)
Rcems from home. Cc of central chest pain since 9am yesterday. Also c/o lower back pain from a fall yesterday.  Pt was sitting on front porch at home. Uses a walker.  ?Had some PVCs that have now decreased since she rec'd 2 NTG tabs and 324 baby aspirin.  ?100/60ish with ems ?20l AC ?Also states she has been having diarrhea since yesterday  ?Initial pain 5/10. Now 2/10 ? ?

## 2021-08-22 NOTE — ED Provider Notes (Signed)
?[facility] ?Provider Note ? ?CSN: [id number] ?Arrival date & time: 08/22/21 2153 ? ?Chief Complaint(s) ?Chest Pain ? ?HPI ?Allison Rollins is a 71 y.o. female with PMH CHF, COPD, HTN who presents the emergency department for evaluation of chest pain.  Patient states that approximately 4 days ago she had an episode of "her legs giving out" and associated syncope.  She states that she has since has improved up until today where she had an episode of crushing substernal chest pain.  She denied associated shortness of breath, diaphoresis, nausea or vomiting but states that her legs did feel weak again.  She called EMS who did not find the patient to have a STEMI but give the patient aspirin and nitro prior to arrival and she states her symptoms have since resolved.  She denies current chest pain, abdominal pain, nausea, vomiting or other systemic symptoms. ? ? ?Past Medical History ?Past Medical History:  ?Diagnosis Date  ? Anxiety   ? Asthma   ? Chest pain 12/22/2015  ? CHF (congestive heart failure) ([place])   ? COPD (chronic obstructive pulmonary disease) ([place])   ? Depression   ? Headache   ? HTN (hypertension)   ? Hypokalemia 07/28/2013  ? Kidney stones   ? Osteopenia 01/22/2020  ? Parkinson's disease   ? Pneumonia   ? Pre-diabetes   ? S/P colonoscopy 10/15/04  ? normal  ? Vitamin D deficiency 11/24/2018  ? ?Patient Active Problem List  ? Diagnosis Date Noted  ? Edema 01/21/2021  ? Stage 3a chronic kidney disease ([place]) 06/17/2020  ? Osteopenia 01/22/2020  ? Neuropathy 10/21/2019  ? 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 Achalasia of esophagus   ? Restless leg 11/24/2018  ? Erosive gastritis   ? Seasonal allergic rhinitis due to pollen 07/20/2018  ? Recurrent depression ([place]) 12/26/2015  ? Generalized anxiety disorder 03/12/2015  ? Prediabetes 03/12/2015  ? Back pain 03/12/2015  ? Stress incontinence 01/06/2015  ? Chronic diastolic heart failure ([place]) 01/03/2015  ? Obesity (BMI 30-39.9) 07/01/2011  ? Essential  hypertension 07/01/2011  ? Mixed hyperlipidemia 07/01/2011  ? GERD (gastroesophageal reflux disease) 09/08/2010  ? ?Home Medication(s) ?Prior to Admission medications   ?Medication Sig Start Date End Date Taking? Authorizing Provider  ?albuterol (PROVENTIL) (2.5 MG/3ML) 0.083% nebulizer solution Take 3 mLs (2.5 mg total) by nebulization every 6 (six) hours as needed for wheezing or shortness of breath. 01/12/18   Baruch Gouty, FNP  ?albuterol (VENTOLIN HFA) 108 (90 Base) MCG/ACT inhaler Inhale 2 puffs into the lungs every 6 (six) hours as needed for wheezing or shortness of breath. 08/18/20   Gwenlyn Perking, FNP  ?Alcohol Swabs (B-D SINGLE USE SWABS REGULAR) PADS Test sugars daily 05/24/18   Baruch Gouty, FNP  ?aspirin EC 81 MG tablet Take 1 tablet (81 mg total) by mouth daily. 12/23/15   Kathie Dike, MD  ?atorvastatin (LIPITOR) 40 MG tablet Take 1 tablet (40 mg total) by mouth daily. 07/30/21   Loman Brooklyn, FNP  ?cetirizine (ZYRTEC) 10 MG tablet Take 1 tablet by mouth every day 03/09/21   Loman Brooklyn, FNP  ?cholecalciferol (VITAMIN D3) 25 MCG (1000 UT) tablet Take 1,000 Units by mouth 2 (two) times daily.     [provider]  ?empagliflozin (JARDIANCE) 10 MG TABS tablet Take 1 tablet (10 mg total) by mouth daily before breakfast. 07/30/21   Loman Brooklyn, FNP  ?famotidine (PEPCID) 20 MG tablet Take 1 tablet by mouth at bedtime  05/08/21   Loman Brooklyn, FNP  ?fluticasone (FLONASE) 50 MCG/ACT nasal spray Use 2 sprays into each nostril every day 07/07/21   Loman Brooklyn, FNP  ?furosemide (LASIX) 20 MG tablet Take 1 tablet (20 mg total) by mouth daily as needed. 06/18/21   Loman Brooklyn, FNP  ?gabapentin (NEURONTIN) 300 MG capsule Take 2 capsules (600 mg total) by mouth 3 (three) times daily. 07/30/21   Loman Brooklyn, FNP  ?glucose blood (ACCU-CHEK AVIVA PLUS) test strip Test sugars daily 02/29/20   Dettinger, Fransisca Kaufmann, MD  ?lisinopril (ZESTRIL) 10 MG tablet Take 1 tablet (10 mg total) by  mouth daily. 07/30/21   Loman Brooklyn, FNP  ?methocarbamol (ROBAXIN) 500 MG tablet Take 1 tablet by mouth 3 times a day as needed for muscle spasms 10/29/20   Loman Brooklyn, FNP  ?Multiple Vitamin (MULTIVITAMIN WITH MINERALS) TABS tablet Take 1 tablet by mouth daily.    [provider]  ?nitroGLYCERIN (NITROSTAT) 0.4 MG SL tablet Place 1 tablet (0.4 mg total) under the tongue every 5 (five) minutes as needed for chest pain. 12/25/15   Eustaquio Maize, MD  ?nystatin (MYCOSTATIN/NYSTOP) powder Apply 1 application. topically 3 (three) times daily. 07/30/21   Loman Brooklyn, FNP  ?Omega-3 Fatty Acids (FISH OIL PO) Take 1 capsule by mouth daily.     [provider]  ?pantoprazole (PROTONIX) 40 MG tablet Take 1 tablet (40 mg total) by mouth 2 (two) times daily. 07/30/21   Loman Brooklyn, FNP  ?potassium chloride SA (K-DUR) 20 MEQ tablet Take 20 mEq by mouth daily as needed (with lasix).    [provider]  ?pramipexole (MIRAPEX) 0.5 MG tablet Take 1 tablet (0.5 mg total) by mouth at bedtime. 07/30/21   Loman Brooklyn, FNP  ?sertraline (ZOLOFT) 100 MG tablet Take one & one-half tablets by mouth every day 07/07/21   Loman Brooklyn, FNP  ?terconazole (TERAZOL 7) 0.4 % vaginal cream Place 1 applicator vaginally at bedtime. 07/06/21   Sharion Balloon, FNP  ?                                                                                                                                  ?Past Surgical History ?Past Surgical History:  ?Procedure Laterality Date  ? CHOLECYSTECTOMY    ? COLONOSCOPY  10/15/2004  ? Normal rectum/Normal colon  ? COLONOSCOPY N/A 05/10/2016  ? Procedure: COLONOSCOPY;  Surgeon: Danie Binder, MD;  Location[facility];  Service: Endoscopy;  Laterality: N/A;  10:30  ? ESOPHAGEAL MANOMETRY N/A 04/02/2019  ? Procedure: ESOPHAGEAL MANOMETRY (EM);  Surgeon: Mauri Pole, MD;  Location: [facility];  Service: Endoscopy;  Laterality: N/A;  ? ESOPHAGOGASTRODUODENOSCOPY   09/11/2010  ? Dysphagia likely multifactorial (possible Candida esophagitis, likely nonspecific esophageal motility disorder, and/or uncontrolled gastroesophageal reflux disease), status post empiric dilation  ? ESOPHAGOGASTRODUODENOSCOPY N/A 11/13/2018  ? Dr.  Fields: No endoscopic esophageal abnormality to explain patient's dysphagia.  Esophagus dilated.  Moderate erosive/nodular gastritis with benign biopsies.  ? KIDNEY STONE SURGERY    ? SAVORY DILATION N/A 11/13/2018  ? Procedure: SAVORY DILATION;  Surgeon: Danie Binder, MD;  Location: [facility];  Service: Endoscopy;  Laterality: N/A;  ? ?Family History ?Family History  ?Problem Relation Age of Onset  ? Coronary artery disease Father   ? COPD Mother   ? Asthma Brother   ? Coronary artery disease Sister   ? Diabetes Sister   ? Coronary artery disease Brother   ? Coronary artery disease Sister   ? ? ?Social History ?Social History  ? ?Tobacco Use  ? Smoking status: Former  ?  Packs/day: 0.25  ?  Years: 30.00  ?  Pack years: 7.50  ?  Types: Cigarettes  ?  Start date: 01/04/1965  ?  Quit date: 01/03/1995  ?  Years since quitting: 26.6  ? Smokeless tobacco: Never  ?Vaping Use  ? Vaping Use: Never used  ?Substance Use Topics  ? Alcohol use: No  ?  Alcohol/week: 0.0 standard drinks  ? Drug use: No  ? ?Allergies ?Farxiga [dapagliflozin], Penicillins, and Sulfa antibiotics ? ?Review of Systems ?Review of Systems  ?Constitutional:  Positive for fatigue.  ?Cardiovascular:  Positive for chest pain.  ?Neurological:  Positive for weakness.  ? ?Physical Exam ?Vital Signs  ?I have reviewed the triage vital signs ?BP 111/60   Pulse 72   Temp 98.5 ?F (36.9 ?C) (Oral)   Resp (!) 22   Ht 5\' 3"  (1.6 m)   Wt 106 kg   SpO2 95%   BMI 41.40 kg/m?  ? ?Physical Exam ?Vitals and nursing note reviewed.  ?Constitutional:   ?   General: She is not in acute distress. ?   Appearance: She is well-developed.  ?HENT:  ?   Head: Normocephalic and atraumatic.  ?Eyes:  ?    Conjunctiva/sclera: Conjunctivae normal.  ?Cardiovascular:  ?   Rate and Rhythm: Normal rate and regular rhythm.  ?   Heart sounds: No murmur heard. ?Pulmonary:  ?   Effort: Pulmonary effort is normal. No respiratory distress

## 2021-08-23 DIAGNOSIS — Z9049 Acquired absence of other specified parts of digestive tract: Secondary | ICD-10-CM | POA: Diagnosis not present

## 2021-08-23 LAB — TROPONIN I (HIGH SENSITIVITY): Troponin I (High Sensitivity): 17 ng/L

## 2021-08-23 NOTE — Discharge Instructions (Signed)
Continue medications as previously prescribed. ? ?Begin taking ibuprofen, 600 mg every 6 hours as needed for pain. ? ?Follow-up with primary doctor in the next 1 to 2 weeks, and return to the ER if you develop worsening pain, high fever, difficulty breathing, or other new and concerning symptoms. ?

## 2021-08-23 NOTE — ED Notes (Signed)
Went over dc papers. Wheeled to lobby.  

## 2021-08-23 NOTE — ED Notes (Signed)
Lab in room.

## 2021-08-23 NOTE — ED Notes (Signed)
Pt calling for ride

## 2021-08-23 NOTE — ED Provider Notes (Signed)
?  Physical Exam  ?BP (!) 100/51   Pulse 79   Temp 98.5 ?F (36.9 ?C) (Oral)   Resp (!) 22   Ht 5\' 3"  (1.6 m)   Wt 106 kg   SpO2 95%   BMI 41.40 kg/m?  ? ?Physical Exam ?Vitals and nursing note reviewed.  ?Constitutional:   ?   General: She is not in acute distress. ?   Appearance: She is well-developed. She is not diaphoretic.  ?HENT:  ?   Head: Normocephalic and atraumatic.  ?Cardiovascular:  ?   Rate and Rhythm: Normal rate and regular rhythm.  ?   Heart sounds: No murmur heard. ?  No friction rub. No gallop.  ?Pulmonary:  ?   Effort: Pulmonary effort is normal. No respiratory distress.  ?   Breath sounds: Normal breath sounds. No wheezing.  ?Abdominal:  ?   General: Bowel sounds are normal. There is no distension.  ?   Palpations: Abdomen is soft.  ?   Tenderness: There is no abdominal tenderness.  ?Musculoskeletal:     ?   General: Normal range of motion.  ?   Cervical back: Normal range of motion and neck supple.  ?Skin: ?   General: Skin is warm and dry.  ?Neurological:  ?   General: No focal deficit present.  ?   Mental Status: She is alert and oriented to person, place, and time.  ? ? ?Procedures  ?Procedures ? ?ED Course / MDM  ?Care assumed from Dr. at shift change.  Patient presenting here after having a syncopal episode 2 days ago, then experiencing chest discomfort since yesterday evening.  Patient's work-up thus far has been unremarkable.  The EKG is unchanged and initial troponin is negative.  Care signed out to me awaiting results of second troponin and CTA of the chest to rule out pulmonary embolism.  There is no evidence for PE on the CT scan and second troponin is still negative and unchanged. ? ?With patient's chest discomfort ongoing for nearly 36 hours and negative enzymes and an unchanged EKG, I highly doubt a cardiac etiology.  CT scan fails to reveal an acute process within her chest. ? ?In discussing with the patient, she tells me she has been under a great deal of stress  lately and believes that this may be stress-induced.  I tend to agree.  I feel as though she can safely be discharged to home with as needed return and follow-up with primary doctor. ? ? ? ? ? ?  ?Posey Rea, MD ?08/23/21 0145 ? ?

## 2021-08-30 ENCOUNTER — Other Ambulatory Visit: Payer: Self-pay | Admitting: Family Medicine

## 2021-08-30 DIAGNOSIS — M545 Low back pain, unspecified: Secondary | ICD-10-CM

## 2021-09-05 ENCOUNTER — Encounter (HOSPITAL_COMMUNITY): Payer: Self-pay | Admitting: Emergency Medicine

## 2021-09-05 ENCOUNTER — Emergency Department (HOSPITAL_COMMUNITY): Payer: Medicare Other

## 2021-09-05 ENCOUNTER — Other Ambulatory Visit: Payer: Self-pay | Admitting: Family Medicine

## 2021-09-05 ENCOUNTER — Other Ambulatory Visit: Payer: Self-pay

## 2021-09-05 ENCOUNTER — Observation Stay (HOSPITAL_COMMUNITY)
Admission: EM | Admit: 2021-09-05 | Discharge: 2021-09-06 | Disposition: A | Discharge status: Home Health | Payer: Medicare Other | Attending: Family Medicine | Admitting: Family Medicine

## 2021-09-05 DIAGNOSIS — Z79899 Other long term (current) drug therapy: Secondary | ICD-10-CM | POA: Diagnosis not present

## 2021-09-05 DIAGNOSIS — I509 Heart failure, unspecified: Secondary | ICD-10-CM | POA: Insufficient documentation

## 2021-09-05 DIAGNOSIS — Z87891 Personal history of nicotine dependence: Secondary | ICD-10-CM | POA: Insufficient documentation

## 2021-09-05 DIAGNOSIS — G20A1 Parkinson's disease without dyskinesia, without mention of fluctuations: Secondary | ICD-10-CM | POA: Diagnosis present

## 2021-09-05 DIAGNOSIS — I110 Hypertensive heart disease with heart failure: Secondary | ICD-10-CM | POA: Insufficient documentation

## 2021-09-05 DIAGNOSIS — W19XXXA Unspecified fall, initial encounter: Secondary | ICD-10-CM

## 2021-09-05 DIAGNOSIS — I10 Essential (primary) hypertension: Secondary | ICD-10-CM | POA: Diagnosis present

## 2021-09-05 DIAGNOSIS — R531 Weakness: Secondary | ICD-10-CM

## 2021-09-05 DIAGNOSIS — M25552 Pain in left hip: Secondary | ICD-10-CM | POA: Diagnosis not present

## 2021-09-05 DIAGNOSIS — M160 Bilateral primary osteoarthritis of hip: Secondary | ICD-10-CM | POA: Diagnosis not present

## 2021-09-05 DIAGNOSIS — N390 Urinary tract infection, site not specified: Principal | ICD-10-CM | POA: Diagnosis present

## 2021-09-05 DIAGNOSIS — J45909 Unspecified asthma, uncomplicated: Secondary | ICD-10-CM | POA: Insufficient documentation

## 2021-09-05 DIAGNOSIS — M25551 Pain in right hip: Secondary | ICD-10-CM | POA: Diagnosis not present

## 2021-09-05 DIAGNOSIS — J449 Chronic obstructive pulmonary disease, unspecified: Secondary | ICD-10-CM | POA: Diagnosis not present

## 2021-09-05 DIAGNOSIS — F419 Anxiety disorder, unspecified: Secondary | ICD-10-CM | POA: Diagnosis present

## 2021-09-05 DIAGNOSIS — Z743 Need for continuous supervision: Secondary | ICD-10-CM | POA: Diagnosis not present

## 2021-09-05 DIAGNOSIS — M549 Dorsalgia, unspecified: Secondary | ICD-10-CM | POA: Diagnosis not present

## 2021-09-05 DIAGNOSIS — R079 Chest pain, unspecified: Secondary | ICD-10-CM | POA: Diagnosis not present

## 2021-09-05 DIAGNOSIS — I6782 Cerebral ischemia: Secondary | ICD-10-CM | POA: Diagnosis not present

## 2021-09-05 DIAGNOSIS — R262 Difficulty in walking, not elsewhere classified: Secondary | ICD-10-CM | POA: Diagnosis not present

## 2021-09-05 DIAGNOSIS — G20 Parkinson's disease: Secondary | ICD-10-CM | POA: Diagnosis present

## 2021-09-05 DIAGNOSIS — E785 Hyperlipidemia, unspecified: Secondary | ICD-10-CM | POA: Diagnosis present

## 2021-09-05 DIAGNOSIS — G319 Degenerative disease of nervous system, unspecified: Secondary | ICD-10-CM | POA: Diagnosis not present

## 2021-09-05 DIAGNOSIS — R7303 Prediabetes: Secondary | ICD-10-CM | POA: Diagnosis present

## 2021-09-05 DIAGNOSIS — N1831 Chronic kidney disease, stage 3a: Secondary | ICD-10-CM

## 2021-09-05 DIAGNOSIS — M545 Low back pain, unspecified: Secondary | ICD-10-CM | POA: Diagnosis not present

## 2021-09-05 DIAGNOSIS — R52 Pain, unspecified: Secondary | ICD-10-CM | POA: Diagnosis not present

## 2021-09-05 DIAGNOSIS — F32A Depression, unspecified: Secondary | ICD-10-CM | POA: Diagnosis present

## 2021-09-05 DIAGNOSIS — K219 Gastro-esophageal reflux disease without esophagitis: Secondary | ICD-10-CM | POA: Diagnosis present

## 2021-09-05 DIAGNOSIS — I5032 Chronic diastolic (congestive) heart failure: Secondary | ICD-10-CM

## 2021-09-05 LAB — BASIC METABOLIC PANEL
Anion gap: 9 (ref 5–15)
BUN: 11 mg/dL (ref 8–23)
CO2: 24 mmol/L (ref 22–32)
Calcium: 8.6 mg/dL — ABNORMAL LOW (ref 8.9–10.3)
Chloride: 108 mmol/L (ref 98–111)
Creatinine, Ser: 0.7 mg/dL (ref 0.44–1.00)
GFR, Estimated: >60 mL/min (ref >60)
Glucose, Bld: 94 mg/dL (ref 70–99)
Potassium: 3.8 mmol/L (ref 3.5–5.1)
Sodium: 141 mmol/L (ref 135–145)

## 2021-09-05 LAB — CBC
HCT: 43.5 % (ref 36.0–46.0)
Hemoglobin: 13.8 g/dL (ref 12.0–15.0)
MCH: 29.1 pg (ref 26.0–34.0)
MCHC: 31.7 g/dL (ref 30.0–36.0)
MCV: 91.6 fL (ref 80.0–100.0)
Platelets: 189 10*3/uL (ref 150–400)
RBC: 4.75 MIL/uL (ref 3.87–5.11)
RDW: 14.1 % (ref 11.5–15.5)
WBC: 9.1 10*3/uL (ref 4.0–10.5)
nRBC: 0 % (ref 0.0–0.2)

## 2021-09-05 LAB — URINALYSIS, ROUTINE W REFLEX MICROSCOPIC
Bilirubin Urine: NEGATIVE
Glucose, UA: >=500 mg/dL — AB
Hgb urine dipstick: NEGATIVE
Ketones, ur: 80 mg/dL — AB
Nitrite: POSITIVE — AB
Protein, ur: NEGATIVE mg/dL
Specific Gravity, Urine: 1.017 (ref 1.005–1.030)
pH: 5 (ref 5.0–8.0)

## 2021-09-05 LAB — CK: Total CK: 49 U/L (ref 38–234)

## 2021-09-05 MED ORDER — NITROGLYCERIN 0.4 MG SL SUBL: 0.4000 mg | SUBLINGUAL_TABLET | SUBLINGUAL | Status: DC | PRN

## 2021-09-05 MED ORDER — POTASSIUM CHLORIDE CRYS ER 20 MEQ PO TBCR: 20.0000 meq | EXTENDED_RELEASE_TABLET | Freq: Every day | ORAL | Status: DC | PRN

## 2021-09-05 MED ORDER — SODIUM CHLORIDE 0.9 % IV BOLUS (SEPSIS)
500.0000 mL | Freq: Once | INTRAVENOUS | Status: AC
Start: 2021-09-05 — End: 2021-09-05
  Administered 2021-09-05: 500 mL via INTRAVENOUS

## 2021-09-05 MED ORDER — INSULIN ASPART 100 UNIT/ML IJ SOLN: 0.0000 [IU] | Freq: Three times a day (TID) | INTRAMUSCULAR | Status: DC

## 2021-09-05 MED ORDER — SODIUM CHLORIDE 0.9 % IV SOLN
1000.0000 mL | INTRAVENOUS | Status: DC
  Administered 2021-09-05: 1000 mL via INTRAVENOUS

## 2021-09-05 MED ORDER — ONDANSETRON HCL 4 MG/2ML IJ SOLN: 4.0000 mg | Freq: Four times a day (QID) | INTRAMUSCULAR | Status: DC | PRN

## 2021-09-05 MED ORDER — MORPHINE SULFATE (PF) 4 MG/ML IV SOLN
4.0000 mg | Freq: Once | INTRAVENOUS | Status: AC
  Administered 2021-09-05: 4 mg via INTRAVENOUS
  Filled 2021-09-05: qty 1

## 2021-09-05 MED ORDER — PANTOPRAZOLE SODIUM 40 MG PO TBEC
40.0000 mg | DELAYED_RELEASE_TABLET | Freq: Two times a day (BID) | ORAL | Status: DC
  Administered 2021-09-06 (×2): 40 mg via ORAL
  Filled 2021-09-05 (×2): qty 1

## 2021-09-05 MED ORDER — LORATADINE 10 MG PO TABS
10.0000 mg | ORAL_TABLET | Freq: Every day | ORAL | Status: DC
  Administered 2021-09-06: 10 mg via ORAL
  Filled 2021-09-05: qty 1

## 2021-09-05 MED ORDER — FAMOTIDINE 20 MG PO TABS
20.0000 mg | ORAL_TABLET | Freq: Every day | ORAL | Status: DC
  Administered 2021-09-06: 20 mg via ORAL
  Filled 2021-09-05: qty 1

## 2021-09-05 MED ORDER — SODIUM CHLORIDE 0.9 % IV SOLN
1.0000 g | INTRAVENOUS | Status: DC
  Administered 2021-09-06: 1 g via INTRAVENOUS
  Filled 2021-09-05: qty 10

## 2021-09-05 MED ORDER — GABAPENTIN 300 MG PO CAPS
600.0000 mg | ORAL_CAPSULE | Freq: Three times a day (TID) | ORAL | Status: DC
  Administered 2021-09-06 (×2): 600 mg via ORAL
  Filled 2021-09-05 (×2): qty 2

## 2021-09-05 MED ORDER — TRAZODONE HCL 50 MG PO TABS: 25.0000 mg | ORAL_TABLET | Freq: Every evening | ORAL | Status: DC | PRN

## 2021-09-05 MED ORDER — ADULT MULTIVITAMIN W/MINERALS CH
1.0000 | ORAL_TABLET | Freq: Every day | ORAL | Status: DC
  Administered 2021-09-06: 1 via ORAL
  Filled 2021-09-05: qty 1

## 2021-09-05 MED ORDER — MORPHINE SULFATE (PF) 2 MG/ML IV SOLN: 2.0000 mg | INTRAVENOUS | Status: DC | PRN

## 2021-09-05 MED ORDER — VITAMIN D 25 MCG (1000 UNIT) PO TABS
1000.0000 [IU] | ORAL_TABLET | Freq: Two times a day (BID) | ORAL | Status: DC
  Administered 2021-09-06 (×2): 1000 [IU] via ORAL
  Filled 2021-09-05 (×2): qty 1

## 2021-09-05 MED ORDER — ENOXAPARIN SODIUM 60 MG/0.6ML IJ SOSY
50.0000 mg | PREFILLED_SYRINGE | INTRAMUSCULAR | Status: DC
  Administered 2021-09-06: 50 mg via SUBCUTANEOUS
  Filled 2021-09-05: qty 0.6

## 2021-09-05 MED ORDER — FLUTICASONE PROPIONATE 50 MCG/ACT NA SUSP
2.0000 | Freq: Every day | NASAL | Status: DC
  Administered 2021-09-06: 2 via NASAL
  Filled 2021-09-05: qty 16

## 2021-09-05 MED ORDER — METHOCARBAMOL 500 MG PO TABS: 500.0000 mg | ORAL_TABLET | Freq: Three times a day (TID) | ORAL | Status: DC | PRN

## 2021-09-05 MED ORDER — SODIUM CHLORIDE 0.9 % IV SOLN
INTRAVENOUS | Status: DC
  Administered 2021-09-06: 100 mL/h via INTRAVENOUS

## 2021-09-05 MED ORDER — OMEGA-3-ACID ETHYL ESTERS 1 G PO CAPS
1.0000 g | ORAL_CAPSULE | Freq: Every day | ORAL | Status: DC
  Administered 2021-09-06: 1 g via ORAL
  Filled 2021-09-05: qty 1

## 2021-09-05 MED ORDER — ONDANSETRON HCL 4 MG PO TABS: 4.0000 mg | ORAL_TABLET | Freq: Four times a day (QID) | ORAL | Status: DC | PRN

## 2021-09-05 MED ORDER — PRAMIPEXOLE DIHYDROCHLORIDE 1 MG PO TABS
0.5000 mg | ORAL_TABLET | Freq: Every day | ORAL | Status: DC
  Administered 2021-09-06: 0.5 mg via ORAL
  Filled 2021-09-05: qty 1

## 2021-09-05 MED ORDER — MAGNESIUM HYDROXIDE 400 MG/5ML PO SUSP: 30.0000 mL | Freq: Every day | ORAL | Status: DC | PRN

## 2021-09-05 MED ORDER — ACETAMINOPHEN 325 MG PO TABS: 650.0000 mg | ORAL_TABLET | Freq: Four times a day (QID) | ORAL | Status: DC | PRN

## 2021-09-05 MED ORDER — ATORVASTATIN CALCIUM 40 MG PO TABS
40.0000 mg | ORAL_TABLET | Freq: Every day | ORAL | Status: DC
  Administered 2021-09-06: 40 mg via ORAL
  Filled 2021-09-05: qty 1

## 2021-09-05 MED ORDER — EMPAGLIFLOZIN 10 MG PO TABS
10.0000 mg | ORAL_TABLET | Freq: Every day | ORAL | Status: DC
  Administered 2021-09-06: 10 mg via ORAL
  Filled 2021-09-05 (×2): qty 1

## 2021-09-05 MED ORDER — SERTRALINE HCL 50 MG PO TABS: 100.0000 mg | ORAL_TABLET | Freq: Every day | ORAL | Status: DC

## 2021-09-05 MED ORDER — ALBUTEROL SULFATE (2.5 MG/3ML) 0.083% IN NEBU: 2.5000 mg | INHALATION_SOLUTION | Freq: Four times a day (QID) | RESPIRATORY_TRACT | Status: DC | PRN

## 2021-09-05 MED ORDER — LISINOPRIL 10 MG PO TABS
10.0000 mg | ORAL_TABLET | Freq: Every day | ORAL | Status: DC
  Administered 2021-09-06: 10 mg via ORAL
  Filled 2021-09-05: qty 1

## 2021-09-05 MED ORDER — ACETAMINOPHEN 650 MG RE SUPP: 650.0000 mg | Freq: Four times a day (QID) | RECTAL | Status: DC | PRN

## 2021-09-05 MED ORDER — FUROSEMIDE 20 MG PO TABS: 20.0000 mg | ORAL_TABLET | Freq: Every day | ORAL | Status: DC | PRN

## 2021-09-05 MED ORDER — ASPIRIN EC 81 MG PO TBEC
81.0000 mg | DELAYED_RELEASE_TABLET | Freq: Every day | ORAL | Status: DC
  Administered 2021-09-06: 81 mg via ORAL
  Filled 2021-09-05: qty 1

## 2021-09-05 NOTE — Assessment & Plan Note (Signed)
-   We will continue Zestril. 

## 2021-09-05 NOTE — Assessment & Plan Note (Signed)
-   We will continue you her Zoloft. ?

## 2021-09-05 NOTE — ED Notes (Addendum)
Attempted to ambulate patient. Pt was very weak and pivoted from bed to chair. Pt requested to sit for a few minutes prior to ambulation. Door to room open, call light within reach and Yellow socks on patient. Will check back with patient to re-attempt.  ? ?Re-attempted to ambulate. Took 2-3 steps and wanted to sit back down. Pt gait appeared very "stiff" especially on right side. EDP made aware.  ?

## 2021-09-05 NOTE — H&P (Signed)
?  ?  ?[facility] ? ? ?PATIENT NAME: Allison Rollins   ? ?MR#:  [id number] ? ?DATE OF BIRTH:  Dec 27, 1950 ? ?DATE OF ADMISSION:  09/05/2021 ? ?PRIMARY CARE PHYSICIAN: Loman Brooklyn, FNP  ? ?Patient is coming from: Home ? ?REQUESTING/REFERRING PHYSICIAN: Davonna Belling, MD  ? ?CHIEF COMPLAINT:  ? ?Chief Complaint  ?Patient presents with  ? Fall  ? ? ?HISTORY OF PRESENT ILLNESS:  ?HERBERT KLEVE is a 71 y.o. Caucasian female with medical history significant for chronic diastolic CHF, hypertension, COPD, depression, urolithiasis, and anxiety, prediabetes and Parkinson's disease, who presented to the ER with acute onset of fall on Thursday that she attributes to losing her balance.  She has been laying on the floor without food or drink since then.  She stated that she slid and fell and landed on her backside.  She denied head injury, presyncope or syncope.  She has been feeling generally weak.  No fever or chills.  She admits to dysuria, urinary frequency and urgency as well as right flank pain.  She has been incontinent to urine and stools though.  No fever or chills.  No nausea or vomiting or abdominal pain.  No chest pain or dyspnea or palpitations.  No cough or wheezing.  She was complaining of low back pain and right hip pain and inability to ambulate more than 2-3 steps with 2 attempts in the ER with stiff gait. ? ?ED Course: When she came to the ER, temperature was 99, BP was 140/97 with otherwise normal vital signs.  BMP was within normal except for calcium of 8.6.  CBC was within normal.  UA was positive for UTI with positive nitrite and more than 500 glucose large leukocytes, 11-20 RBCs and 21-50 WBCs.Marland Kitchen ?EKG as reviewed by me : EKG showed normal sinus rhythm with rate of 77 with borderline prolonged QT interval with QTc of 487 MS. ?Imaging: Two-view chest x-ray showed no acute cardiopulmonary disease.  LS-spine x-ray showed no evidence for fracture or acute findings.  There was grade 1 anterolisthesis of L4 on  L5 that is new compared to an exam in 2018.Noncontrasted CT scan revealed no acute intracranial abnormality.  Right hip CT without contrast revealed no hip or pelvic fracture. ? ?The patient was given 500 mL IV normal saline bolus and 4 mg of IV morphine sulfate.  We will give her a gram of IV Rocephin for her UTI.  She will be admitted to an observation medical bed for further evaluation and management. ?PAST MEDICAL HISTORY:  ? ?Past Medical History:  ?Diagnosis Date  ? Anxiety   ? Asthma   ? Chest pain 12/22/2015  ? CHF (congestive heart failure) ([place])   ? COPD (chronic obstructive pulmonary disease) ([place])   ? Depression   ? Headache   ? HTN (hypertension)   ? Hypokalemia 07/28/2013  ? Kidney stones   ? Osteopenia 01/22/2020  ? Parkinson's disease   ? Pneumonia   ? Pre-diabetes   ? S/P colonoscopy 10/15/04  ? normal  ? Vitamin D deficiency 11/24/2018  ? ? ?PAST SURGICAL HISTORY:  ? ?Past Surgical History:  ?Procedure Laterality Date  ? CHOLECYSTECTOMY    ? COLONOSCOPY  10/15/2004  ? Normal rectum/Normal colon  ? COLONOSCOPY N/A 05/10/2016  ? Procedure: COLONOSCOPY;  Surgeon: Danie Binder, MD;  Location: [facility];  Service: Endoscopy;  Laterality: N/A;  10:30  ? ESOPHAGEAL MANOMETRY N/A 04/02/2019  ? Procedure: ESOPHAGEAL MANOMETRY (EM);  Surgeon: Silverio Decamp,  Venia Minks, MD;  Location: [facility];  Service: Endoscopy;  Laterality: N/A;  ? ESOPHAGOGASTRODUODENOSCOPY  09/11/2010  ? Dysphagia likely multifactorial (possible Candida esophagitis, likely nonspecific esophageal motility disorder, and/or uncontrolled gastroesophageal reflux disease), status post empiric dilation  ? ESOPHAGOGASTRODUODENOSCOPY N/A 11/13/2018  ? Dr. Oneida Alar: No endoscopic esophageal abnormality to explain patient's dysphagia.  Esophagus dilated.  Moderate erosive/nodular gastritis with benign biopsies.  ? KIDNEY STONE SURGERY    ? SAVORY DILATION N/A 11/13/2018  ? Procedure: SAVORY DILATION;  Surgeon: Danie Binder, MD;  Location: [facility];  Service: Endoscopy;  Laterality: N/A;  ? ? ?SOCIAL HISTORY:  ? ?Social History  ? ?Tobacco Use  ? Smoking status: Former  ?  Packs/day: 0.25  ?  Years: 30.00  ?  Pack years: 7.50  ?  Types: Cigarettes  ?  Start date: 01/04/1965  ?  Quit date: 01/03/1995  ?  Years since quitting: 26.6  ? Smokeless tobacco: Never  ?Substance Use Topics  ? Alcohol use: No  ?  Alcohol/week: 0.0 standard drinks  ? ? ?FAMILY HISTORY:  ? ?Family History  ?Problem Relation Age of Onset  ? Coronary artery disease Father   ? COPD Mother   ? Asthma Brother   ? Coronary artery disease Sister   ? Diabetes Sister   ? Coronary artery disease Brother   ? Coronary artery disease Sister   ? ? ?DRUG ALLERGIES:  ? ?Allergies  ?Allergen Reactions  ? Wilder Glade [Dapagliflozin] Swelling  ? Penicillins Itching and Rash  ?  Has patient had a PCN reaction causing immediate rash, facial/tongue/throat swelling, SOB or lightheadedness with hypotension: no ?Has patient had a PCN reaction causing severe rash involving mucus membranes or skin necrosis: no ?Has patient had a PCN reaction that required hospitalization: no ?Has patient had a PCN reaction occurring within the last 10 years: no ?If all of the above answers are "NO", then may proceed with Cephalosporin use. ?  ? Sulfa Antibiotics Rash  ? ? ?REVIEW OF SYSTEMS:  ? ?ROS ?As per history of present illness. All pertinent systems were reviewed above. Constitutional, HEENT, cardiovascular, respiratory, GI, GU, musculoskeletal, neuro, psychiatric, endocrine, integumentary and hematologic systems were reviewed and are otherwise negative/unremarkable except for positive findings mentioned above in the HPI. ? ? ?MEDICATIONS AT HOME:  ? ?Prior to Admission medications   ?Medication Sig Start Date End Date Taking? Authorizing Provider  ?albuterol (PROVENTIL) (2.5 MG/3ML) 0.083% nebulizer solution Take 3 mLs (2.5 mg total) by nebulization every 6 (six) hours as needed for wheezing or shortness of breath. 01/12/18    Baruch Gouty, FNP  ?albuterol (VENTOLIN HFA) 108 (90 Base) MCG/ACT inhaler Inhale 2 puffs into the lungs every 6 (six) hours as needed for wheezing or shortness of breath. 08/18/20   Gwenlyn Perking, FNP  ?Alcohol Swabs (B-D SINGLE USE SWABS REGULAR) PADS Test sugars daily 05/24/18   Baruch Gouty, FNP  ?aspirin EC 81 MG tablet Take 1 tablet (81 mg total) by mouth daily. 12/23/15   Kathie Dike, MD  ?atorvastatin (LIPITOR) 40 MG tablet Take 1 tablet (40 mg total) by mouth daily. 07/30/21   Loman Brooklyn, FNP  ?cetirizine (ZYRTEC) 10 MG tablet Take 1 tablet by mouth every day 03/09/21   Loman Brooklyn, FNP  ?cholecalciferol (VITAMIN D3) 25 MCG (1000 UT) tablet Take 1,000 Units by mouth 2 (two) times daily.     [provider]  ?empagliflozin (JARDIANCE) 10 MG TABS tablet Take 1 tablet (  10 mg total) by mouth daily before breakfast. 07/30/21   Loman Brooklyn, FNP  ?famotidine (PEPCID) 20 MG tablet Take 1 tablet by mouth at bedtime 05/08/21   Loman Brooklyn, FNP  ?fluticasone (FLONASE) 50 MCG/ACT nasal spray Use 2 sprays into each nostril every day 07/07/21   Loman Brooklyn, FNP  ?furosemide (LASIX) 20 MG tablet Take 1 tablet (20 mg total) by mouth daily as needed. 06/18/21   Loman Brooklyn, FNP  ?gabapentin (NEURONTIN) 300 MG capsule Take 2 capsules (600 mg total) by mouth 3 (three) times daily. 07/30/21   Loman Brooklyn, FNP  ?glucose blood (ACCU-CHEK AVIVA PLUS) test strip Test sugars daily 02/29/20   Dettinger, Fransisca Kaufmann, MD  ?lisinopril (ZESTRIL) 10 MG tablet Take 1 tablet (10 mg total) by mouth daily. 07/30/21   Loman Brooklyn, FNP  ?methocarbamol (ROBAXIN) 500 MG tablet Take 1 tablet by mouth 3 times a day as needed for muscle spasms 08/31/21   Loman Brooklyn, FNP  ?Multiple Vitamin (MULTIVITAMIN WITH MINERALS) TABS tablet Take 1 tablet by mouth daily.    [provider]  ?nitroGLYCERIN (NITROSTAT) 0.4 MG SL tablet Place 1 tablet (0.4 mg total) under the tongue every 5 (five) minutes as  needed for chest pain. 12/25/15   Eustaquio Maize, MD  ?nystatin (MYCOSTATIN/NYSTOP) powder Apply 1 application. topically 3 (three) times daily. 07/30/21   Loman Brooklyn, FNP  ?Omega-3 Fatty Acids (FI

## 2021-09-05 NOTE — Assessment & Plan Note (Signed)
We will continue Mirapex 

## 2021-09-05 NOTE — Assessment & Plan Note (Addendum)
-   We will continue statin therapy and fish oil/Lovaza. 

## 2021-09-05 NOTE — ED Provider Notes (Signed)
?  Physical Exam  ?BP 124/77   Pulse 72   Temp 99 ?F (37.2 ?C) (Oral)   Resp 16   Ht 5\' 3"  (1.6 m)   Wt 100.7 kg   SpO2 95%   BMI 39.33 kg/m?  ? ?Physical Exam ? ?Procedures  ?Procedures ? ?ED Course / MDM  ?  ?Medical Decision Making ?Amount and/or Complexity of Data Reviewed ?Labs: ordered. ?Radiology: ordered. ? ?Risk ?Prescription drug management. ? ? ?Patient with fall.  Reportedly had been unable to get up for 2 to 3 days.  Is been laying on the floor not eating and drinking.  Reportedly did not hit head.  Lab work overall reassuring but still unable to ambulate.  Feeling generally weak.  We will now add head CT but will require admission to hospital since cannot walk.  Planing of pain in right hip and CT scan done and reassuring after negative x-ray. ? ? ? ? ?  ? , MD ?09/05/21 2226 ? ?

## 2021-09-05 NOTE — Assessment & Plan Note (Addendum)
--   resume home meds 

## 2021-09-05 NOTE — Assessment & Plan Note (Addendum)
-   She was not able to walk more than 2 to 3 feet due to her recent fall and generalized weakness. ?- Physical therapy consult obtained and recommended home health PT which is ordered . ?- DC home with HHPT and fall precautions.  ?

## 2021-09-05 NOTE — Assessment & Plan Note (Signed)
-   We will continue albuterol nebulizer as needed. ?

## 2021-09-05 NOTE — ED Provider Notes (Signed)
?[facility] ?Provider Note ? ? ?CSN: [id number] ?Arrival date & time: 09/05/21  1351 ? ?  ? ?History ? ?Chief Complaint  ?Patient presents with  ? Fall  ? ? ?Allison Rollins is a 71 y.o. female. ? ? ?Fall ? ?Patient has a history of hypertension, Parkinson's disease, COPD, CHF, osteopenia who presents to the ED for evaluation after a fall.  Patient states she slipped and fell on Thursday.  She landed on her backside.  Patient has not been able to get up since that time.  She is having pain in her lower back and hip region.  She has been lying on the ground and has not been able to eat or drink.  She has been incontinent of stool and urine.  She denies any fevers or chills.  No chest pain or shortness of breath. ?  ? ?Home Medications ?Prior to Admission medications   ?Medication Sig Start Date End Date Taking? Authorizing Provider  ?albuterol (PROVENTIL) (2.5 MG/3ML) 0.083% nebulizer solution Take 3 mLs (2.5 mg total) by nebulization every 6 (six) hours as needed for wheezing or shortness of breath. 01/12/18   Sonny Mastersakes, Linda M, FNP  ?albuterol (VENTOLIN HFA) 108 (90 Base) MCG/ACT inhaler Inhale 2 puffs into the lungs every 6 (six) hours as needed for wheezing or shortness of breath. 08/18/20   Gabriel EaringMorgan, Tiffany M, FNP  ?Alcohol Swabs (B-D SINGLE USE SWABS REGULAR) PADS Test sugars daily 05/24/18   Sonny Mastersakes, Linda M, FNP  ?aspirin EC 81 MG tablet Take 1 tablet (81 mg total) by mouth daily. 12/23/15   Erick BlinksMemon, Jehanzeb, MD  ?atorvastatin (LIPITOR) 40 MG tablet Take 1 tablet (40 mg total) by mouth daily. 07/30/21   Gwenlyn FudgeJoyce, Britney F, FNP  ?cetirizine (ZYRTEC) 10 MG tablet Take 1 tablet by mouth every day 03/09/21   Gwenlyn FudgeJoyce, Britney F, FNP  ?cholecalciferol (VITAMIN D3) 25 MCG (1000 UT) tablet Take 1,000 Units by mouth 2 (two) times daily.     [provider]  ?empagliflozin (JARDIANCE) 10 MG TABS tablet Take 1 tablet (10 mg total) by mouth daily before breakfast. 07/30/21   Gwenlyn FudgeJoyce, Britney F, FNP  ?famotidine  (PEPCID) 20 MG tablet Take 1 tablet by mouth at bedtime 05/08/21   Gwenlyn FudgeJoyce, Britney F, FNP  ?fluticasone (FLONASE) 50 MCG/ACT nasal spray Use 2 sprays into each nostril every day 07/07/21   Gwenlyn FudgeJoyce, Britney F, FNP  ?furosemide (LASIX) 20 MG tablet Take 1 tablet (20 mg total) by mouth daily as needed. 06/18/21   Gwenlyn FudgeJoyce, Britney F, FNP  ?gabapentin (NEURONTIN) 300 MG capsule Take 2 capsules (600 mg total) by mouth 3 (three) times daily. 07/30/21   Gwenlyn FudgeJoyce, Britney F, FNP  ?glucose blood (ACCU-CHEK AVIVA PLUS) test strip Test sugars daily 02/29/20   Dettinger, Elige RadonJoshua A, MD  ?lisinopril (ZESTRIL) 10 MG tablet Take 1 tablet (10 mg total) by mouth daily. 07/30/21   Gwenlyn FudgeJoyce, Britney F, FNP  ?methocarbamol (ROBAXIN) 500 MG tablet Take 1 tablet by mouth 3 times a day as needed for muscle spasms 08/31/21   Gwenlyn FudgeJoyce, Britney F, FNP  ?Multiple Vitamin (MULTIVITAMIN WITH MINERALS) TABS tablet Take 1 tablet by mouth daily.    [provider]  ?nitroGLYCERIN (NITROSTAT) 0.4 MG SL tablet Place 1 tablet (0.4 mg total) under the tongue every 5 (five) minutes as needed for chest pain. 12/25/15   Johna SheriffVincent, Carol L, MD  ?nystatin (MYCOSTATIN/NYSTOP) powder Apply 1 application. topically 3 (three) times daily. 07/30/21   Gwenlyn FudgeJoyce, Britney F, FNP  ?  Omega-3 Fatty Acids (FISH OIL PO) Take 1 capsule by mouth daily.     [provider]  ?pantoprazole (PROTONIX) 40 MG tablet Take 1 tablet (40 mg total) by mouth 2 (two) times daily. 07/30/21   Gwenlyn Fudge, FNP  ?potassium chloride SA (K-DUR) 20 MEQ tablet Take 20 mEq by mouth daily as needed (with lasix).    [provider]  ?pramipexole (MIRAPEX) 0.5 MG tablet Take 1 tablet (0.5 mg total) by mouth at bedtime. 07/30/21   Gwenlyn Fudge, FNP  ?sertraline (ZOLOFT) 100 MG tablet Take one & one-half tablets by mouth every day 07/07/21   Gwenlyn Fudge, FNP  ?terconazole (TERAZOL 7) 0.4 % vaginal cream Place 1 applicator vaginally at bedtime. 07/06/21   Junie Spencer, FNP  ?   ? ?Allergies     ?Farxiga [dapagliflozin], Penicillins, and Sulfa antibiotics   ? ?Review of Systems   ?Review of Systems  ?Constitutional:  Negative for fever.  ? ?Physical Exam ?Updated Vital Signs ?BP (!) 140/97   Pulse 74   Temp 99 ?F (37.2 ?C) (Oral)   Resp 17   Ht 1.6 m (5\' 3" )   Wt 100.7 kg   SpO2 94%   BMI 39.33 kg/m?  ?Physical Exam ?Vitals and nursing note reviewed.  ?Constitutional:   ?   General: She is not in acute distress. ?   Appearance: She is well-developed.  ?HENT:  ?   Head: Normocephalic and atraumatic.  ?   Right Ear: External ear normal.  ?   Left Ear: External ear normal.  ?Eyes:  ?   General: No scleral icterus.    ?   Right eye: No discharge.     ?   Left eye: No discharge.  ?   Conjunctiva/sclera: Conjunctivae normal.  ?Neck:  ?   Trachea: No tracheal deviation.  ?Cardiovascular:  ?   Rate and Rhythm: Normal rate and regular rhythm.  ?Pulmonary:  ?   Effort: Pulmonary effort is normal. No respiratory distress.  ?   Breath sounds: Normal breath sounds. No stridor. No wheezing or rales.  ?Abdominal:  ?   General: Bowel sounds are normal. There is no distension.  ?   Palpations: Abdomen is soft.  ?   Tenderness: There is no abdominal tenderness. There is no guarding or rebound.  ?Genitourinary: ?   Comments: Patient's clothing is soiled from stool and urine ?Musculoskeletal:     ?   General: No deformity.  ?   Cervical back: Neck supple.  ?   Lumbar back: Tenderness present.  ?   Left hip: Tenderness present. No deformity.  ?Skin: ?   General: Skin is warm and dry.  ?   Findings: No rash.  ?Neurological:  ?   General: No focal deficit present.  ?   Mental Status: She is alert.  ?   Cranial Nerves: No cranial nerve deficit (no facial droop, extraocular movements intact, no slurred speech).  ?   Sensory: No sensory deficit.  ?   Motor: No abnormal muscle tone or seizure activity.  ?   Coordination: Coordination normal.  ?Psychiatric:     ?   Mood and Affect: Mood normal.  ? ? ?ED Results / Procedures /  Treatments   ?Labs ?(all labs ordered are listed, but only abnormal results are displayed) ?Labs Reviewed - No data to display ? ?EKG ?None ? ?Radiology ?No results found. ? ?Procedures ?Procedures  ? ? ?Medications Ordered in ED ?Medications -  No data to display ? ?ED Course/ Medical Decision Making/ A&P ?  ?                        ?Medical Decision Making ?Problems Addressed: ?Fall, initial encounter: acute illness or injury ?Generalized weakness: acute illness or injury that poses a threat to life or bodily functions ? ?Amount and/or Complexity of Data Reviewed ?Labs: ordered. ?Radiology: ordered. ? ?Risk ?Prescription drug management. ?Decision regarding hospitalization. ? ? ?Patient presented to the ED for evaluation after a fall.  Patient was having pain and weakness and was unable to get up off of the floor for the last couple days.  On arrival patient had pain in her back and hip area.  She had soiled herself.  Presentation concerning for the possibility of rhabdomyolysis dehydration, anemia as well as multiple other etiologies.  Pain is concerning for the possibility of spinal fracture versus pelvic Fracture.  Labs and imaging test pending at the time of shift change.  Care turned over to Dr. Rubin Payor ? ? ? ? ? ? ? ?Final Clinical Impression(s) / ED Diagnoses ?pending ? ? ?  ?Linwood Dibbles, MD ?09/06/21 0745 ? ?

## 2021-09-05 NOTE — Assessment & Plan Note (Addendum)
-   The patient will be admitted to an observation medical bed. ?- This could be contributing to her generalized weakness and fall. ?- We will manage with IV Rocephin and follow urine culture. ?- She was hydrated with IV normal saline. ?-- DC home to complete levofloxacin x 2 days to complete therapy ?

## 2021-09-05 NOTE — ED Triage Notes (Signed)
Pt arrived via [facility] c/o fall Thursday night. L shoulder pain--However, pain has went away since moving around. Denies hitting head, states she just lost her balance A&O x 4  ?

## 2021-09-05 NOTE — Assessment & Plan Note (Signed)
-   We will continue PPI therapy 

## 2021-09-06 ENCOUNTER — Encounter (HOSPITAL_COMMUNITY): Payer: Self-pay | Admitting: Family Medicine

## 2021-09-06 DIAGNOSIS — E785 Hyperlipidemia, unspecified: Secondary | ICD-10-CM | POA: Diagnosis not present

## 2021-09-06 DIAGNOSIS — N390 Urinary tract infection, site not specified: Secondary | ICD-10-CM | POA: Diagnosis not present

## 2021-09-06 DIAGNOSIS — J449 Chronic obstructive pulmonary disease, unspecified: Secondary | ICD-10-CM

## 2021-09-06 DIAGNOSIS — R7303 Prediabetes: Secondary | ICD-10-CM

## 2021-09-06 DIAGNOSIS — I10 Essential (primary) hypertension: Secondary | ICD-10-CM | POA: Diagnosis not present

## 2021-09-06 DIAGNOSIS — K2100 Gastro-esophageal reflux disease with esophagitis, without bleeding: Secondary | ICD-10-CM | POA: Diagnosis not present

## 2021-09-06 DIAGNOSIS — R262 Difficulty in walking, not elsewhere classified: Secondary | ICD-10-CM | POA: Diagnosis not present

## 2021-09-06 DIAGNOSIS — G20 Parkinson's disease: Secondary | ICD-10-CM

## 2021-09-06 LAB — HEMOGLOBIN A1C
Hgb A1c MFr Bld: 5.8 % — ABNORMAL HIGH (ref 4.8–5.6)
Mean Plasma Glucose: 119.76 mg/dL

## 2021-09-06 LAB — GLUCOSE, CAPILLARY
Glucose-Capillary: 88 mg/dL (ref 70–99)
Glucose-Capillary: 109 mg/dL — ABNORMAL HIGH (ref 70–99)

## 2021-09-06 LAB — CBC
HCT: 39.3 % (ref 36.0–46.0)
Hemoglobin: 12.8 g/dL (ref 12.0–15.0)
MCH: 30 pg (ref 26.0–34.0)
MCHC: 32.6 g/dL (ref 30.0–36.0)
MCV: 92.3 fL (ref 80.0–100.0)
Platelets: 163 10*3/uL (ref 150–400)
RBC: 4.26 MIL/uL (ref 3.87–5.11)
RDW: 14.4 % (ref 11.5–15.5)
WBC: 6.9 10*3/uL (ref 4.0–10.5)
nRBC: 0 % (ref 0.0–0.2)

## 2021-09-06 LAB — BASIC METABOLIC PANEL
Anion gap: 6 (ref 5–15)
BUN: 9 mg/dL (ref 8–23)
CO2: 26 mmol/L (ref 22–32)
Calcium: 8.4 mg/dL — ABNORMAL LOW (ref 8.9–10.3)
Chloride: 109 mmol/L (ref 98–111)
Creatinine, Ser: 0.68 mg/dL (ref 0.44–1.00)
GFR, Estimated: >60 mL/min (ref >60)
Glucose, Bld: 99 mg/dL (ref 70–99)
Potassium: 3.8 mmol/L (ref 3.5–5.1)
Sodium: 141 mmol/L (ref 135–145)

## 2021-09-06 MED ORDER — SERTRALINE HCL 50 MG PO TABS
150.0000 mg | ORAL_TABLET | Freq: Every day | ORAL | Status: DC
  Administered 2021-09-06: 150 mg via ORAL
  Filled 2021-09-06: qty 3

## 2021-09-06 MED ORDER — LEVOFLOXACIN 500 MG PO TABS: 500.0000 mg | ORAL_TABLET | Freq: Every day | ORAL | 0 refills | Status: AC

## 2021-09-06 NOTE — Discharge Summary (Signed)
Physician Discharge Summary  ?Allison Rollins [id number] DOB: 1950-12-05 DOA: 09/05/2021 ? ?PCP: Loman Brooklyn, FNP ? ?Admit date: 09/05/2021 ?Discharge date: 09/06/2021 ? ?Admitted From:  Home  ?Disposition: Home with HH  ? ?Recommendations for Outpatient Follow-up:  ?Follow up with PCP in 1 weeks ?Follow up with cardiology as scheduled  ? ?Home Health:  PT ? ?Discharge Condition: stable   ?CODE STATUS: full ?Diet: carb modified, heart healthy   ? ?Brief Hospitalization Summary: ?Please see all hospital notes, images, labs for full details of the hospitalization. ?71 y.o. Caucasian female with medical history significant for chronic diastolic CHF, hypertension, COPD, depression, urolithiasis, and anxiety, prediabetes and Parkinson's disease, who presented to the ER with acute onset of fall on Thursday that she attributes to losing her balance.  She has been laying on the floor without food or drink since then.  She stated that she slid and fell and landed on her backside.  She denied head injury, presyncope or syncope.  She has been feeling generally weak.  No fever or chills.  She admits to dysuria, urinary frequency and urgency as well as right flank pain.  She has been incontinent to urine and stools though.  No fever or chills.  No nausea or vomiting or abdominal pain.  No chest pain or dyspnea or palpitations.  No cough or wheezing.  She was complaining of low back pain and right hip pain and inability to ambulate more than 2-3 steps with 2 attempts in the ER with stiff gait. ?  ?ED Course: When she came to the ER, temperature was 99, BP was 140/97 with otherwise normal vital signs.  BMP was within normal except for calcium of 8.6.  CBC was within normal.  UA was positive for UTI with positive nitrite and more than 500 glucose large leukocytes, 11-20 RBCs and 21-50 WBCs.Marland Kitchen ?EKG as reviewed by me : EKG showed normal sinus rhythm with rate of 77 with borderline prolonged QT interval with QTc of 487 MS. ?Imaging:  Two-view chest x-ray showed no acute cardiopulmonary disease.  LS-spine x-ray showed no evidence for fracture or acute findings.  There was grade 1 anterolisthesis of L4 on L5 that is new compared to an exam in 2018.Noncontrasted CT scan revealed no acute intracranial abnormality.  Right hip CT without contrast revealed no hip or pelvic fracture. ? ?The patient was given 500 mL IV normal saline bolus and 4 mg of IV morphine sulfate.  We will give her a gram of IV Rocephin for her UTI.  She will be admitted to an observation medical bed for further evaluation and management. ? ?Assessment and Plan: ?* Acute lower UTI ?- The patient will be admitted to an observation medical bed. ?- This could be contributing to her generalized weakness and fall. ?- We will manage with IV Rocephin and follow urine culture. ?- She was hydrated with IV normal saline. ?-- DC home to complete levofloxacin x 2 days to complete therapy ? ?Chronic obstructive pulmonary disease (COPD) ([place]) ?- We will continue albuterol nebulizer as needed. ? ?Parkinson's disease ([place]) ?- We will continue Mirapex. ? ?Anxiety and depression ?- We will continue you her Zoloft. ? ?Dyslipidemia ?- We will continue statin therapy and fish oil/Lovaza. ? ?Unable to ambulate ?- She was not able to walk more than 2 to 3 feet due to her recent fall and generalized weakness. ?- Physical therapy consult obtained and recommended home health PT which is ordered . ?- DC home with HHPT  and fall precautions.  ? ?Prediabetes ?- resume home meds. ? ?Essential hypertension ?- We will continue Zestril. ? ?GERD (gastroesophageal reflux disease) ?- We will continue PPI therapy. ? ?Discharge Diagnoses:  ?Principal Problem: ?  Acute lower UTI ?Active Problems: ?  GERD (gastroesophageal reflux disease) ?  Essential hypertension ?  Prediabetes ?  Unable to ambulate ?  Dyslipidemia ?  Anxiety and depression ?  Parkinson's disease ([place]) ?  Chronic obstructive pulmonary disease (COPD)  ([place]) ? ? ?Discharge Instructions: ? ?Allergies as of 09/06/2021   ? ?   Reactions  ? Wilder Glade [dapagliflozin] Swelling  ? Penicillins Itching, Rash  ? Has patient had a PCN reaction causing immediate rash, facial/tongue/throat swelling, SOB or lightheadedness with hypotension: no ?Has patient had a PCN reaction causing severe rash involving mucus membranes or skin necrosis: no ?Has patient had a PCN reaction that required hospitalization: no ?Has patient had a PCN reaction occurring within the last 10 years: no ?If all of the above answers are "NO", then may proceed with Cephalosporin use.  ? Sulfa Antibiotics Rash  ? ?  ? ?  ?Medication List  ?  ? ?STOP taking these medications   ? ?terconazole 0.4 % vaginal cream ?Commonly known as: TERAZOL 7 ?  ? ?  ? ?TAKE these medications   ? ?Accu-Chek Aviva Plus test strip ?Generic drug: glucose blood ?Test sugars daily ?  ?albuterol (2.5 MG/3ML) 0.083% nebulizer solution ?Commonly known as: PROVENTIL ?Take 3 mLs (2.5 mg total) by nebulization every 6 (six) hours as needed for wheezing or shortness of breath. ?  ?albuterol 108 (90 Base) MCG/ACT inhaler ?Commonly known as: VENTOLIN HFA ?Inhale 2 puffs into the lungs every 6 (six) hours as needed for wheezing or shortness of breath. ?  ?aspirin EC 81 MG tablet ?Take 1 tablet (81 mg total) by mouth daily. ?  ?atorvastatin 40 MG tablet ?Commonly known as: LIPITOR ?Take 1 tablet (40 mg total) by mouth daily. ?  ?B-D SINGLE USE SWABS REGULAR Pads ?Test sugars daily ?  ?cetirizine 10 MG tablet ?Commonly known as: ZYRTEC ?Take 1 tablet by mouth every day ?  ?cholecalciferol 25 MCG (1000 UNIT) tablet ?Commonly known as: VITAMIN D3 ?Take 1,000 Units by mouth 2 (two) times daily. ?  ?empagliflozin 10 MG Tabs tablet ?Commonly known as: Jardiance ?Take 1 tablet (10 mg total) by mouth daily before breakfast. ?  ?famotidine 20 MG tablet ?Commonly known as: PEPCID ?Take 1 tablet by mouth at bedtime ?  ?FISH OIL PO ?Take 1 capsule by mouth  daily. ?  ?fluticasone 50 MCG/ACT nasal spray ?Commonly known as: FLONASE ?Use 2 sprays into each nostril every day ?  ?furosemide 20 MG tablet ?Commonly known as: LASIX ?Take 1 tablet (20 mg total) by mouth daily as needed. ?  ?gabapentin 300 MG capsule ?Commonly known as: NEURONTIN ?Take 2 capsules (600 mg total) by mouth 3 (three) times daily. ?  ?levofloxacin 500 MG tablet ?Commonly known as: Levaquin ?Take 1 tablet (500 mg total) by mouth daily for 2 days. ?Start taking on: Sep 07, 2021 ?  ?lisinopril 10 MG tablet ?Commonly known as: ZESTRIL ?Take 1 tablet (10 mg total) by mouth daily. ?  ?methocarbamol 500 MG tablet ?Commonly known as: ROBAXIN ?Take 1 tablet by mouth 3 times a day as needed for muscle spasms ?What changed: See the new instructions. ?  ?multivitamin with minerals Tabs tablet ?Take 1 tablet by mouth daily. ?  ?nitroGLYCERIN 0.4 MG SL tablet ?Commonly known as:  NITROSTAT ?Place 1 tablet (0.4 mg total) under the tongue every 5 (five) minutes as needed for chest pain. ?  ?nystatin powder ?Commonly known as: MYCOSTATIN/NYSTOP ?Apply 1 application. topically 3 (three) times daily. ?  ?pantoprazole 40 MG tablet ?Commonly known as: PROTONIX ?Take 1 tablet (40 mg total) by mouth 2 (two) times daily. ?  ?potassium chloride SA 20 MEQ tablet ?Commonly known as: KLOR-CON M ?Take 20 mEq by mouth daily as needed (with lasix). ?  ?pramipexole 0.5 MG tablet ?Commonly known as: MIRAPEX ?Take 1 tablet (0.5 mg total) by mouth at bedtime. ?  ?sertraline 100 MG tablet ?Commonly known as: ZOLOFT ?Take one & one-half tablets by mouth every day ?What changed:  ?how much to take ?how to take this ?when to take this ?  ? ?  ? ? Follow-up Information   ? ? Loman Brooklyn, FNP. Schedule an appointment as soon as possible for a visit in 1 week(s).   ?Specialty: Family Medicine ?Why: Hospital Follow Up ?Contact information: ?[address] ?[place] [address] ?[phone] ? ? ?  ?  ? ? Arnoldo Lenis, MD .    ?Specialty: Cardiology ?Contact information: ?[address] ?[address] ?[place] [address] ?[phone] ? ? ?  ?  ? ?  ?  ? ?  ? ?Allergies  ?Allergen Reactions  ? Wilder Glade [Dapagliflozin] Swelling  ? Penicill

## 2021-09-06 NOTE — Discharge Instructions (Signed)
IMPORTANT INFORMATION: PAY CLOSE ATTENTION   PHYSICIAN DISCHARGE INSTRUCTIONS  Follow with Primary care provider  Joyce, Britney F, FNP  and other consultants as instructed by your Hospitalist Physician  SEEK MEDICAL CARE OR RETURN TO EMERGENCY ROOM IF SYMPTOMS COME BACK, WORSEN OR NEW PROBLEM DEVELOPS   Please note: You were cared for by a hospitalist during your hospital stay. Every effort will be made to forward records to your primary care provider.  You can request that your primary care provider send for your hospital records if they have not received them.  Once you are discharged, your primary care physician will handle any further medical issues. Please note that NO REFILLS for any discharge medications will be authorized once you are discharged, as it is imperative that you return to your primary care physician (or establish a relationship with a primary care physician if you do not have one) for your post hospital discharge needs so that they can reassess your need for medications and monitor your lab values.  Please get a complete blood count and chemistry panel checked by your Primary MD at your next visit, and again as instructed by your Primary MD.  Get Medicines reviewed and adjusted: Please take all your medications with you for your next visit with your Primary MD  Laboratory/radiological data: Please request your Primary MD to go over all hospital tests and procedure/radiological results at the follow up, please ask your primary care provider to get all Hospital records sent to his/her office.  In some cases, they will be blood work, cultures and biopsy results pending at the time of your discharge. Please request that your primary care provider follow up on these results.  If you are diabetic, please bring your blood sugar readings with you to your follow up appointment with primary care.    Please call and make your follow up appointments as soon as possible.    Also  Note the following: If you experience worsening of your admission symptoms, develop shortness of breath, life threatening emergency, suicidal or homicidal thoughts you must seek medical attention immediately by calling 911 or calling your MD immediately  if symptoms less severe.  You must read complete instructions/literature along with all the possible adverse reactions/side effects for all the Medicines you take and that have been prescribed to you. Take any new Medicines after you have completely understood and accpet all the possible adverse reactions/side effects.   Do not drive when taking Pain medications or sleeping medications (Benzodiazepines)  Do not take more than prescribed Pain, Sleep and Anxiety Medications. It is not advisable to combine anxiety,sleep and pain medications without talking with your primary care practitioner  Special Instructions: If you have smoked or chewed Tobacco  in the last 2 yrs please stop smoking, stop any regular Alcohol  and or any Recreational drug use.  Wear Seat belts while driving.  Do not drive if taking any narcotic, mind altering or controlled substances or recreational drugs or alcohol.       

## 2021-09-06 NOTE — Progress Notes (Signed)
This RNCM spoke with patient regarding PT evaluation.  ? ?Allison Rollins, BSN, RN, CCM, [place] [phone] ?RNCM spoke with pt at bedside via phone regarding discharge planning for Home Health Services. Offered pt medicare.gov list of home health agencies to choose from. Patient will accept any HHC agency that takes her insurance.  Multiple HHC agencies contacted awaiting a response with one decline. Patient made aware that  once a [facility] agency is in place they will be in contact in 24-48 hours. Patient reports having the following DME: rollator, walkerand cane. No DME needs identified at this time.  ? ?Patient reports she may have a ride home, either her son or her friend that she worked with. Patient reports her son Allison Rollins is taking care of his wife postop and not sure if he can come pick her up. Patient gave this RNCM permission to speak with her son Allison Rollins. ? ?TOC will continue to follow. ? ? ?Note: Due to system problems prefilled notes are not pulling up for this RNCM ?

## 2021-09-06 NOTE — Progress Notes (Signed)
Dinamap showed patient's pulse rate was 25. We checked the patient's pulse on her wrist and it was 62, but was irregular. MD Laural Benes made aware.  ?

## 2021-09-06 NOTE — Progress Notes (Signed)
Patient would benefit from home health. ?

## 2021-09-06 NOTE — Evaluation (Signed)
Physical Therapy Evaluation ?Patient Details ?Name: Gwendolyne Welford Ion ?MRN: [id number] ?DOB: 1951/02/20 ?Today's Date: 09/06/2021 ? ?History of Present Illness ? Ms. Mcgrady fell on 5/11 was not able to get up and was not found until 5/13 where she was transferred to the ER.  Pt has a hx of Parkinson's and was found to have a UTI; she was admitted into the hospital.  Nursing staff were unable to ambulate the pt more than two steps therefore Physical Therapy has been ordered.  ?Clinical Impression ? Pt needed instruction for bed mobility but improved greatly with repetition.   ?   ? ?Recommendations for follow up therapy are one component of a multi-disciplinary discharge planning process, led by the attending physician.  Recommendations may be updated based on patient status, additional functional criteria and insurance authorization. ? ?Follow Up Recommendations Home health PT ? ?  ?Assistance Recommended at Discharge  Son intermittent  ?Patient can return home with the following ? A little help with bathing/dressing/bathroom;Assistance with cooking/housework;Help with stairs or ramp for entrance ? ?  ?Equipment Recommendations None recommended by PT  ?Recommendations for Other Services ?    ?  ?Functional Status Assessment Patient has had a recent decline in their functional status and demonstrates the ability to make significant improvements in function in a reasonable and predictable amount of time.  ? ?  ?Precautions / Restrictions Precautions ?Precautions: None  ? ?  ? ?Mobility ? Bed Mobility ?Overal bed mobility: Modified Independent ?  ?  ?  ?  ?  ?  ?  ?  ? ?Transfers ?Overall transfer level: Modified independent ?Equipment used: Rolling walker (2 wheels) ?  ?  ?  ?  ?  ?  ?  ?  ?  ? ?Ambulation/Gait ?Ambulation/Gait assistance: Modified independent (Device/Increase time) ?Gait Distance (Feet): 200 Feet ?Assistive device: Rolling walker (2 wheels) ?Gait Pattern/deviations: Decreased step length - right, Decreased  step length - left ?  ?  ?  ?  ?  ? ? ? ? ?Pertinent Vitals/Pain Pain Assessment ?Pain Assessment: 0-10 ?Pain Score: 8  ?Pain Location: Rt  hip where she fell;  this has been x rayed and is (-) for fx ?Pain Descriptors / Indicators: Aching ?Pain Intervention(s): Limited activity within patient's tolerance  ? ? ?Home Living Family/patient expects to be discharged to:: Private residence ?Living Arrangements: Alone ?Available Help at Discharge: Family ?Type of Home: House ?Home Access: Ramped entrance ?  ?  ?  ?Home Layout: One level ?Home Equipment: Agricultural consultant (2 wheels) ?   ?  ?Prior Function Prior Level of Function : Independent/Modified Independent ?  ?  ?  ?  ?  ?  ?  ?ADLs Comments: son completes grocery shopping and brings some meals to her. ?  ? ? ?   ?Extremity/Trunk Assessment  ?   ?  ? ?Lower Extremity Assessment ?Lower Extremity Assessment: Overall WFL for tasks assessed ?  ? ?   ?Communication  ? Communication: No difficulties  ?Cognition Arousal/Alertness: Awake/alert ?Behavior During Therapy: [facility] for tasks assessed/performed ?Overall Cognitive Status: Within Functional Limits for tasks assessed ?  ?  ?  ?  ?  ?  ?  ?  ?  ?  ?  ?  ?  ?  ?  ?  ?General Comments: PT states that she feels much better today. ?  ?  ? ?  ?   ?   ? ?Assessment/Plan  ?  ?PT Assessment Patient needs continued PT services  ?  PT Problem List Decreased strength;Decreased activity tolerance;Decreased balance ? ?   ?  ?PT Treatment Interventions Gait training;Therapeutic exercise;Therapeutic activities   ? ?PT Goals (Current goals can be found in the Care Plan section)  ?  ? ?  ?Frequency Min 3X/week ?  ? ? ?   ?AM-PAC PT "6 Clicks" Mobility  ?Outcome Measure Help needed turning from your back to your side while in a flat bed without using bedrails?: None ?Help needed moving from lying on your back to sitting on the side of a flat bed without using bedrails?: A Little ?Help needed moving to and from a bed to a chair (including a  wheelchair)?: A Little ?Help needed standing up from a chair using your arms (e.g., wheelchair or bedside chair)?: A Little ?Help needed to walk in hospital room?: None ?Help needed climbing 3-5 steps with a railing? : A Lot ?6 Click Score: 19 ? ?  ?End of Session Equipment Utilized During Treatment: Gait belt ?Activity Tolerance: Patient tolerated treatment well ?Patient left: with chair alarm set ?Nurse Communication: Mobility status ?PT Visit Diagnosis: Unsteadiness on feet (R26.81);Pain ?Pain - Right/Left: Right ?Pain - part of body: Hip ?  ? ?Time: 0930-1000 ?PT Time Calculation (min) (ACUTE ONLY): 30 min ? ? ?Charges:   PT Evaluation ?$PT Eval Low Complexity: 1 Low ?  ?  ?   ? ?Virgina Organ, PT CLT ?[phone]  ?09/06/2021, 11:36 AM ? ?

## 2021-09-06 NOTE — Progress Notes (Signed)
Patient is not steady on her feet according to the tech. The tech informed me that she does not think the patient is steady enough to go home on her own even though she will have home health. Worthy Rancher the case Production designer, theatre/television/film and MD Laural Benes were notified of this. Winnie the case manager called the son to inform him of this and he stated that they check on her daily and that she will be fine going back home.  ?

## 2021-09-06 NOTE — Hospital Course (Signed)
71 y.o. Caucasian female with medical history significant for chronic diastolic CHF, hypertension, COPD, depression, urolithiasis, and anxiety, prediabetes and Parkinson's disease, who presented to the ER with acute onset of fall on Thursday that she attributes to losing her balance.  She has been laying on the floor without food or drink since then.  She stated that she slid and fell and landed on her backside.  She denied head injury, presyncope or syncope.  She has been feeling generally weak.  No fever or chills.  She admits to dysuria, urinary frequency and urgency as well as right flank pain.  She has been incontinent to urine and stools though.  No fever or chills.  No nausea or vomiting or abdominal pain.  No chest pain or dyspnea or palpitations.  No cough or wheezing.  She was complaining of low back pain and right hip pain and inability to ambulate more than 2-3 steps with 2 attempts in the ER with stiff gait. ?  ?ED Course: When she came to the ER, temperature was 99, BP was 140/97 with otherwise normal vital signs.  BMP was within normal except for calcium of 8.6.  CBC was within normal.  UA was positive for UTI with positive nitrite and more than 500 glucose large leukocytes, 11-20 RBCs and 21-50 WBCs.Marland Kitchen ?EKG as reviewed by me : EKG showed normal sinus rhythm with rate of 77 with borderline prolonged QT interval with QTc of 487 MS. ?Imaging: Two-view chest x-ray showed no acute cardiopulmonary disease.  LS-spine x-ray showed no evidence for fracture or acute findings.  There was grade 1 anterolisthesis of L4 on L5 that is new compared to an exam in 2018.Noncontrasted CT scan revealed no acute intracranial abnormality.  Right hip CT without contrast revealed no hip or pelvic fracture. ? ?The patient was given 500 mL IV normal saline bolus and 4 mg of IV morphine sulfate.  We will give her a gram of IV Rocephin for her UTI.  She will be admitted to an observation medical bed for further evaluation and  management. ?

## 2021-09-06 NOTE — Plan of Care (Signed)

## 2021-09-06 NOTE — Progress Notes (Signed)
Patient arrived to the unit, comfort was afforded. Denies any pain. ?

## 2021-09-06 NOTE — Plan of Care (Signed)
?  Problem: Acute Rehab PT Goals(only PT should resolve) ?Goal: Pt Will Go Supine/Side To Sit ?Flowsheets (Taken 09/06/2021 1133) ?Pt will go Supine/Side to Sit: Independently ?Goal: Patient Will Perform Sitting Balance ?09/06/2021 1134 by Bella Kennedy, PT ?Outcome: Adequate for Discharge ?09/06/2021 1133 by Bella Kennedy, PT ?Outcome: Adequate for Discharge ?Goal: Pt Will Ambulate ?Flowsheets (Taken 09/06/2021 1134) ?Pt will Ambulate: ? > 125 feet ? Independently ?  ?

## 2021-09-06 NOTE — TOC Progression Note (Signed)
This RNCM spoke with Allison Rollins's son Allison Rollins, who advised he checks on his mother everyday and she has plenty of food. Allison Rollins also has plenty ofamily and friends to check on her. Allison Rollins's son reports Allison Rollins is a little unhappy that he moved 30 minutes away from her, compared to previously he lived 5 mins up the street from her. This RNCM advised I will continue to follow for her HHC needs since none of the [facility] agencies are responding today. Allison Rollins's son request a call preferably a text once HHC established.    ? ?Allison Rollins's friend is on her way to pick the Allison Rollins up for discharge. ? ?No additional TOC needs. ? ? ? ?Plantz,Bradley (Son)  ?[phone] (Mobile  ?

## 2021-09-07 ENCOUNTER — Telehealth: Payer: Self-pay

## 2021-09-07 NOTE — Telephone Encounter (Signed)
RNCM left voicemail to advise none of the [facility] agencies has accepted the her for services. RNCM left callback phone #. ? ?No additional TOC needs ?

## 2021-09-07 NOTE — Telephone Encounter (Signed)
RNCM spoke with patient's son Leory Plowman to advise none of the [facility] agencies accepted patient at this time. This RNCM offered private pay list which Leory Plowman states they can not afford. Leory Plowman states the patient has Medicaid, this RNCM advised to contact patient's Medicaid SW to get more information on applying for [facility] assistance. Leory Plowman states he checks on his mother daily and she has a friend that's 2 houses down that checks on her as well as the police come by to check on her. Leory Plowman reports patietn was admitted to SNF previously however checked herself out of SNF a few days later. ? ? ?No additional TOC needs. ?

## 2021-09-09 LAB — URINE CULTURE: Culture: >=100000 — AB

## 2021-09-25 ENCOUNTER — Ambulatory Visit: Payer: Medicare Other | Admitting: Cardiology

## 2021-09-25 NOTE — Progress Notes (Deleted)
Clinical Summary Ms. Crabtree is a 71 y.o.female seen today for follow up of the following medical problems.     1. Chronic diastolic heart failure   - no recent edema per her report  - 07/2021 CT PE suggestions of RV dysfunction     2. HTN - she is compliant with meds   3. COPD - compliant with inhalers   4. Bradycardia - no recent symptoms   5. Hyperlipidemia   10/2018 TC 219 TG 196 HDL 45 LDL 135 - 12/2019 TC 182 TG 154 HDL 47 LD 108 - compliant with statin   6. OSA screen  - she reports test was negative.      7.Syncope/chest pain - ER visit 08/23/21 with syncope, chest pain - trops neg, EKG SR no acute ischemic changes. - CT PE neg for PE   SH: has 3 grandkids, 2 sons. Husband just passed this March. Works at [employer].  One grandson will be starting at [employer] in 2 years. Granddaughter   Another grandson due Oct/Nov.    Past Medical History:  Diagnosis Date   Anxiety    Asthma    Chest pain 12/22/2015   CHF (congestive heart failure) (HCC)    COPD (chronic obstructive pulmonary disease) (HCC)    Depression    Headache    HTN (hypertension)    Hypokalemia 07/28/2013   Kidney stones    Osteopenia 01/22/2020   Parkinson's disease    Pneumonia    Pre-diabetes    S/P colonoscopy 10/15/04   normal   Vitamin D deficiency 11/24/2018     Allergies  Allergen Reactions   Farxiga [Dapagliflozin] Swelling   Penicillins Itching and Rash    Has patient had a PCN reaction causing immediate rash, facial/tongue/throat swelling, SOB or lightheadedness with hypotension: no Has patient had a PCN reaction causing severe rash involving mucus membranes or skin necrosis: no Has patient had a PCN reaction that required hospitalization: no Has patient had a PCN reaction occurring within the last 10 years: no If all of the above answers are "NO", then may proceed with Cephalosporin use.    Sulfa Antibiotics Rash     Current Outpatient Medications   Medication Sig Dispense Refill   albuterol (PROVENTIL) (2.5 MG/3ML) 0.083% nebulizer solution Take 3 mLs (2.5 mg total) by nebulization every 6 (six) hours as needed for wheezing or shortness of breath. 150 mL 1   albuterol (VENTOLIN HFA) 108 (90 Base) MCG/ACT inhaler Inhale 2 puffs into the lungs every 6 (six) hours as needed for wheezing or shortness of breath. 1 each 0   Alcohol Swabs (B-D SINGLE USE SWABS REGULAR) PADS Test sugars daily 100 each 3   aspirin EC 81 MG tablet Take 1 tablet (81 mg total) by mouth daily.     atorvastatin (LIPITOR) 40 MG tablet Take 1 tablet (40 mg total) by mouth daily. 90 tablet 1   cetirizine (ZYRTEC) 10 MG tablet Take 1 tablet by mouth every day (Patient taking differently: Take 10 mg by mouth daily.) 30 tablet 11   cholecalciferol (VITAMIN D3) 25 MCG (1000 UT) tablet Take 1,000 Units by mouth 2 (two) times daily.      famotidine (PEPCID) 20 MG tablet Take 1 tablet by mouth at bedtime (Patient taking differently: Take 20 mg by mouth at bedtime.) 30 tablet 5   fluticasone (FLONASE) 50 MCG/ACT nasal spray Use 2 sprays into each nostril every day 16 g 5   furosemide (LASIX)  20 MG tablet Take 1 tablet (20 mg total) by mouth daily as needed. 90 tablet 1   gabapentin (NEURONTIN) 300 MG capsule Take 2 capsules (600 mg total) by mouth 3 (three) times daily. 540 capsule 1   glucose blood (ACCU-CHEK AVIVA PLUS) test strip Test sugars daily 100 each 3   JARDIANCE 10 MG TABS tablet Take 1 tablet by mouth every day before breakfast 30 tablet 2   lisinopril (ZESTRIL) 10 MG tablet Take 1 tablet (10 mg total) by mouth daily. 90 tablet 1   methocarbamol (ROBAXIN) 500 MG tablet Take 1 tablet by mouth 3 times a day as needed for muscle spasms (Patient taking differently: Take 500 mg by mouth 3 (three) times daily as needed for muscle spasms.) 30 tablet 0   Multiple Vitamin (MULTIVITAMIN WITH MINERALS) TABS tablet Take 1 tablet by mouth daily.     nitroGLYCERIN (NITROSTAT) 0.4 MG  SL tablet Place 1 tablet (0.4 mg total) under the tongue every 5 (five) minutes as needed for chest pain. 20 tablet 3   nystatin (MYCOSTATIN/NYSTOP) powder Apply 1 application. topically 3 (three) times daily. 60 g 1   Omega-3 Fatty Acids (FISH OIL PO) Take 1 capsule by mouth daily.      pantoprazole (PROTONIX) 40 MG tablet Take 1 tablet (40 mg total) by mouth 2 (two) times daily. 180 tablet 1   potassium chloride SA (K-DUR) 20 MEQ tablet Take 20 mEq by mouth daily as needed (with lasix).     pramipexole (MIRAPEX) 0.5 MG tablet Take 1 tablet (0.5 mg total) by mouth at bedtime. 90 tablet 1   sertraline (ZOLOFT) 100 MG tablet Take one & one-half tablets by mouth every day (Patient taking differently: Take 150 mg by mouth daily. Take one & one-half tablets by mouth every day) 45 tablet 5   No current facility-administered medications for this visit.     Past Surgical History:  Procedure Laterality Date   CHOLECYSTECTOMY     COLONOSCOPY  10/15/2004   Normal rectum/Normal colon   COLONOSCOPY N/A 05/10/2016   Procedure: COLONOSCOPY;  Surgeon: West BaliSandi L Fields, MD;  Location: [facility];  Service: Endoscopy;  Laterality: N/A;  10:30   ESOPHAGEAL MANOMETRY N/A 04/02/2019   Procedure: ESOPHAGEAL MANOMETRY (EM);  Surgeon: Napoleon FormNandigam, Kavitha V, MD;  Location: [facility];  Service: Endoscopy;  Laterality: N/A;   ESOPHAGOGASTRODUODENOSCOPY  09/11/2010   Dysphagia likely multifactorial (possible Candida esophagitis, likely nonspecific esophageal motility disorder, and/or uncontrolled gastroesophageal reflux disease), status post empiric dilation   ESOPHAGOGASTRODUODENOSCOPY N/A 11/13/2018   Dr. Darrick PennaFields: No endoscopic esophageal abnormality to explain patient's dysphagia.  Esophagus dilated.  Moderate erosive/nodular gastritis with benign biopsies.   KIDNEY STONE SURGERY     SAVORY DILATION N/A 11/13/2018   Procedure: SAVORY DILATION;  Surgeon: West BaliFields, Sandi L, MD;  Location: [facility];  Service:  Endoscopy;  Laterality: N/A;     Allergies  Allergen Reactions   Farxiga [Dapagliflozin] Swelling   Penicillins Itching and Rash    Has patient had a PCN reaction causing immediate rash, facial/tongue/throat swelling, SOB or lightheadedness with hypotension: no Has patient had a PCN reaction causing severe rash involving mucus membranes or skin necrosis: no Has patient had a PCN reaction that required hospitalization: no Has patient had a PCN reaction occurring within the last 10 years: no If all of the above answers are "NO", then may proceed with Cephalosporin use.    Sulfa Antibiotics Rash      Family History  Problem Relation Age of Onset   Coronary artery disease Father    COPD Mother    Asthma Brother    Coronary artery disease Sister    Diabetes Sister    Coronary artery disease Brother    Coronary artery disease Sister      Social History Ms. Struthers reports that she quit smoking about 26 years ago. Her smoking use included cigarettes. She started smoking about 56 years ago. She has a 7.50 pack-year smoking history. She has never used smokeless tobacco. Ms. Fesmire reports no history of alcohol use.   Review of Systems CONSTITUTIONAL: No weight loss, fever, chills, weakness or fatigue.  HEENT: Eyes: No visual loss, blurred vision, double vision or yellow sclerae.No hearing loss, sneezing, congestion, runny nose or sore throat.  SKIN: No rash or itching.  CARDIOVASCULAR:  RESPIRATORY: No shortness of breath, cough or sputum.  GASTROINTESTINAL: No anorexia, nausea, vomiting or diarrhea. No abdominal pain or blood.  GENITOURINARY: No burning on urination, no polyuria NEUROLOGICAL: No headache, dizziness, syncope, paralysis, ataxia, numbness or tingling in the extremities. No change in bowel or bladder control.  MUSCULOSKELETAL: No muscle, back pain, joint pain or stiffness.  LYMPHATICS: No enlarged nodes. No history of splenectomy.  PSYCHIATRIC: No history of  depression or anxiety.  ENDOCRINOLOGIC: No reports of sweating, cold or heat intolerance. No polyuria or polydipsia.  Marland Kitchen   Physical Examination There were no vitals filed for this visit. There were no vitals filed for this visit.  Gen: resting comfortably, no acute distress HEENT: no scleral icterus, pupils equal round and reactive, no palptable cervical adenopathy,  CV Resp: Clear to auscultation bilaterally GI: abdomen is soft, non-tender, non-distended, normal bowel sounds, no hepatosplenomegaly MSK: extremities are warm, no edema.  Skin: warm, no rash Neuro:  no focal deficits Psych: appropriate affect   Diagnostic Studies  11/2013 Echo Study Conclusions  - Procedure narrative: Transthoracic echocardiography. Image quality was suboptimal. The study was technically difficult, as a result of poor sound wave transmission and body habitus. - Left ventricle: The cavity size was normal. Wall thickness was increased in a pattern of mild LVH. Systolic function was low normal. The estimated ejection fraction was approximately 50%. Images were inadequate for LV wall motion assessment, but no gross regional variation was noted. Features are consistent with a pseudonormal left ventricular filling pattern, with concomitant abnormal relaxation and increased filling pressure (grade 2 diastolic dysfunction). Doppler parameters are consistent with both elevated ventricular end-diastolic filling pressure and elevated left atrial filling pressure. - Mitral valve: Mildly calcified annulus. Mildly thickened leaflets . There was mild regurgitation. - Left atrium: The atrium was moderately to severely dilated.   11/2015  Nuclear stress There was no ST segment deviation noted during stress. No T wave inversion was noted during stress. Findings consistent with prior myocardial infarction. This is a low risk study. The left ventricular ejection fraction is mildly decreased (45-54%).   Small  inferolateral wall infarct at mid and basal level EF estimated 50% but looks normal no ischemia   Assessment and Plan  1. Chronic diastolic heart failure - overall controlled, continue diuretic.    2. HTN -manual recheck 134/80, essentially at goal. Continue current meds   3. Bradycardia - chronic sinus bradycardia that is asymptomatic - continue to monitor.    4. Hyperlipidemia - at goal, continue statin      Arnoldo Lenis, M.D., F.A.C.C.

## 2021-09-29 ENCOUNTER — Ambulatory Visit: Payer: Medicare Other | Admitting: Cardiology

## 2021-10-23 ENCOUNTER — Ambulatory Visit (INDEPENDENT_AMBULATORY_CARE_PROVIDER_SITE_OTHER): Payer: Medicare Other | Admitting: Cardiology

## 2021-10-23 ENCOUNTER — Encounter: Payer: Self-pay | Admitting: Cardiology

## 2021-10-23 VITALS — BP 134/78 | HR 71 | Ht 66.0 in | Wt 229.2 lb

## 2021-10-23 DIAGNOSIS — R0602 Shortness of breath: Secondary | ICD-10-CM | POA: Diagnosis not present

## 2021-10-23 DIAGNOSIS — I10 Essential (primary) hypertension: Secondary | ICD-10-CM | POA: Diagnosis not present

## 2021-10-23 DIAGNOSIS — R001 Bradycardia, unspecified: Secondary | ICD-10-CM

## 2021-10-23 DIAGNOSIS — E782 Mixed hyperlipidemia: Secondary | ICD-10-CM | POA: Diagnosis not present

## 2021-10-23 DIAGNOSIS — I5032 Chronic diastolic (congestive) heart failure: Secondary | ICD-10-CM | POA: Diagnosis not present

## 2021-10-23 DIAGNOSIS — R55 Syncope and collapse: Secondary | ICD-10-CM | POA: Diagnosis not present

## 2021-10-23 NOTE — Progress Notes (Signed)
Clinical Summary Allison Rollins is a 71 y.o.female seen today for follow up of the following medical problems.     1. Chronic diastolic heart failure  - no recent edema per her report - 07/2021 CT PE suggested some RV dysfunction  - some increased swelling . Has lasix 20mg  prn, takes daily.    2. HTN - she is compliant with meds   3. COPD - compliant with inhalers      4. Hyperlipidemia - compliant with statin - 07/2021 TC 172 TG 143 HDL 47 LDL 100   5. OSA screen  - she reports test was negative.    6.Fall/Presyncope Admit 08/2021 with fall, +UTI - ER visit 08/23/21 with preynsyncope and chest pain. CT PE was negative.  - no recurrent syncope. She standing at stove. Felt lightheaded and weakness, fell to floor. She denies LOC. Reports nausea and had not eaten 2 days leading up this presyncopal episode.    SH: has 3 grandkids, 2 sons. Husband just passed this March. Works at [employer].  One grandson will be starting at [employer] in 2 years. Granddaughter      Past Medical History:  Diagnosis Date   Anxiety    Asthma    Chest pain 12/22/2015   CHF (congestive heart failure) (HCC)    COPD (chronic obstructive pulmonary disease) (HCC)    Depression    Headache    HTN (hypertension)    Hypokalemia 07/28/2013   Kidney stones    Osteopenia 01/22/2020   Parkinson's disease    Pneumonia    Pre-diabetes    S/P colonoscopy 10/15/04   normal   Vitamin D deficiency 11/24/2018     Allergies  Allergen Reactions   Farxiga [Dapagliflozin] Swelling   Penicillins Itching and Rash    Has patient had a PCN reaction causing immediate rash, facial/tongue/throat swelling, SOB or lightheadedness with hypotension: no Has patient had a PCN reaction causing severe rash involving mucus membranes or skin necrosis: no Has patient had a PCN reaction that required hospitalization: no Has patient had a PCN reaction occurring within the last 10 years: no If all of the above  answers are "NO", then may proceed with Cephalosporin use.    Sulfa Antibiotics Rash     Current Outpatient Medications  Medication Sig Dispense Refill   albuterol (PROVENTIL) (2.5 MG/3ML) 0.083% nebulizer solution Take 3 mLs (2.5 mg total) by nebulization every 6 (six) hours as needed for wheezing or shortness of breath. 150 mL 1   albuterol (VENTOLIN HFA) 108 (90 Base) MCG/ACT inhaler Inhale 2 puffs into the lungs every 6 (six) hours as needed for wheezing or shortness of breath. 1 each 0   Alcohol Swabs (B-D SINGLE USE SWABS REGULAR) PADS Test sugars daily 100 each 3   aspirin EC 81 MG tablet Take 1 tablet (81 mg total) by mouth daily.     atorvastatin (LIPITOR) 40 MG tablet Take 1 tablet (40 mg total) by mouth daily. 90 tablet 1   cetirizine (ZYRTEC) 10 MG tablet Take 1 tablet by mouth every day (Patient taking differently: Take 10 mg by mouth daily.) 30 tablet 11   cholecalciferol (VITAMIN D3) 25 MCG (1000 UT) tablet Take 1,000 Units by mouth 2 (two) times daily.      famotidine (PEPCID) 20 MG tablet Take 1 tablet by mouth at bedtime (Patient taking differently: Take 20 mg by mouth at bedtime.) 30 tablet 5   fluticasone (FLONASE) 50 MCG/ACT nasal spray Use  2 sprays into each nostril every day 16 g 5   furosemide (LASIX) 20 MG tablet Take 1 tablet (20 mg total) by mouth daily as needed. 90 tablet 1   gabapentin (NEURONTIN) 300 MG capsule Take 2 capsules (600 mg total) by mouth 3 (three) times daily. 540 capsule 1   glucose blood (ACCU-CHEK AVIVA PLUS) test strip Test sugars daily 100 each 3   JARDIANCE 10 MG TABS tablet Take 1 tablet by mouth every day before breakfast 30 tablet 2   lisinopril (ZESTRIL) 10 MG tablet Take 1 tablet (10 mg total) by mouth daily. 90 tablet 1   methocarbamol (ROBAXIN) 500 MG tablet Take 1 tablet by mouth 3 times a day as needed for muscle spasms (Patient taking differently: Take 500 mg by mouth 3 (three) times daily as needed for muscle spasms.) 30 tablet 0    Multiple Vitamin (MULTIVITAMIN WITH MINERALS) TABS tablet Take 1 tablet by mouth daily.     nitroGLYCERIN (NITROSTAT) 0.4 MG SL tablet Place 1 tablet (0.4 mg total) under the tongue every 5 (five) minutes as needed for chest pain. 20 tablet 3   nystatin (MYCOSTATIN/NYSTOP) powder Apply 1 application. topically 3 (three) times daily. 60 g 1   Omega-3 Fatty Acids (FISH OIL PO) Take 1 capsule by mouth daily.      pantoprazole (PROTONIX) 40 MG tablet Take 1 tablet (40 mg total) by mouth 2 (two) times daily. 180 tablet 1   potassium chloride SA (K-DUR) 20 MEQ tablet Take 20 mEq by mouth daily as needed (with lasix).     pramipexole (MIRAPEX) 0.5 MG tablet Take 1 tablet (0.5 mg total) by mouth at bedtime. 90 tablet 1   sertraline (ZOLOFT) 100 MG tablet Take one & one-half tablets by mouth every day (Patient taking differently: Take 150 mg by mouth daily. Take one & one-half tablets by mouth every day) 45 tablet 5   No current facility-administered medications for this visit.     Past Surgical History:  Procedure Laterality Date   CHOLECYSTECTOMY     COLONOSCOPY  10/15/2004   Normal rectum/Normal colon   COLONOSCOPY N/A 05/10/2016   Procedure: COLONOSCOPY;  Surgeon: West Bali, MD;  Location: [facility];  Service: Endoscopy;  Laterality: N/A;  10:30   ESOPHAGEAL MANOMETRY N/A 04/02/2019   Procedure: ESOPHAGEAL MANOMETRY (EM);  Surgeon: Napoleon Form, MD;  Location: [facility];  Service: Endoscopy;  Laterality: N/A;   ESOPHAGOGASTRODUODENOSCOPY  09/11/2010   Dysphagia likely multifactorial (possible Candida esophagitis, likely nonspecific esophageal motility disorder, and/or uncontrolled gastroesophageal reflux disease), status post empiric dilation   ESOPHAGOGASTRODUODENOSCOPY N/A 11/13/2018   Dr. Darrick Penna: No endoscopic esophageal abnormality to explain patient's dysphagia.  Esophagus dilated.  Moderate erosive/nodular gastritis with benign biopsies.   KIDNEY STONE SURGERY     SAVORY  DILATION N/A 11/13/2018   Procedure: SAVORY DILATION;  Surgeon: West Bali, MD;  Location: [facility];  Service: Endoscopy;  Laterality: N/A;     Allergies  Allergen Reactions   Farxiga [Dapagliflozin] Swelling   Penicillins Itching and Rash    Has patient had a PCN reaction causing immediate rash, facial/tongue/throat swelling, SOB or lightheadedness with hypotension: no Has patient had a PCN reaction causing severe rash involving mucus membranes or skin necrosis: no Has patient had a PCN reaction that required hospitalization: no Has patient had a PCN reaction occurring within the last 10 years: no If all of the above answers are "NO", then may proceed with Cephalosporin use.  Sulfa Antibiotics Rash      Family History  Problem Relation Age of Onset   Coronary artery disease Father    COPD Mother    Asthma Brother    Coronary artery disease Sister    Diabetes Sister    Coronary artery disease Brother    Coronary artery disease Sister      Social History Allison Rollins reports that she quit smoking about 26 years ago. Her smoking use included cigarettes. She started smoking about 56 years ago. She has a 7.50 pack-year smoking history. She has never used smokeless tobacco. Allison Rollins reports no history of alcohol use.   Review of Systems CONSTITUTIONAL: No weight loss, fever, chills, weakness or fatigue.  HEENT: Eyes: No visual loss, blurred vision, double vision or yellow sclerae.No hearing loss, sneezing, congestion, runny nose or sore throat.  SKIN: No rash or itching.  CARDIOVASCULAR: per hpi RESPIRATORY: No shortness of breath, cough or sputum.  GASTROINTESTINAL: No anorexia, nausea, vomiting or diarrhea. No abdominal pain or blood.  GENITOURINARY: No burning on urination, no polyuria NEUROLOGICAL: No headache, dizziness, syncope, paralysis, ataxia, numbness or tingling in the extremities. No change in bowel or bladder control.  MUSCULOSKELETAL: No muscle, back  pain, joint pain or stiffness.  LYMPHATICS: No enlarged nodes. No history of splenectomy.  PSYCHIATRIC: No history of depression or anxiety.  ENDOCRINOLOGIC: No reports of sweating, cold or heat intolerance. No polyuria or polydipsia.  Marland Kitchen   Physical Examination Today's Vitals   10/23/21 1011  BP: 134/78  Pulse: 71  SpO2: 93%  Weight: 229 lb 3.2 oz (104 kg)  Height: 5\' 6"  (1.676 m)   Body mass index is 36.99 kg/m.  Gen: resting comfortably, no acute distress HEENT: no scleral icterus, pupils equal round and reactive, no palptable cervical adenopathy,  CV: RRR, no m/r/g no jvd Resp: Clear to auscultation bilaterally GI: abdomen is soft, non-tender, non-distended, normal bowel sounds, no hepatosplenomegaly MSK: extremities are warm, trace bilateral edema Skin: warm, no rash Neuro:  no focal deficits Psych: appropriate affect   Diagnostic Studies  11/2013 Echo Study Conclusions  - Procedure narrative: Transthoracic echocardiography. Image quality was suboptimal. The study was technically difficult, as a result of poor sound wave transmission and body habitus. - Left ventricle: The cavity size was normal. Wall thickness was increased in a pattern of mild LVH. Systolic function was low normal. The estimated ejection fraction was approximately 50%. Images were inadequate for LV wall motion assessment, but no gross regional variation was noted. Features are consistent with a pseudonormal left ventricular filling pattern, with concomitant abnormal relaxation and increased filling pressure (grade 2 diastolic dysfunction). Doppler parameters are consistent with both elevated ventricular end-diastolic filling pressure and elevated left atrial filling pressure. - Mitral valve: Mildly calcified annulus. Mildly thickened leaflets . There was mild regurgitation. - Left atrium: The atrium was moderately to severely dilated.   11/2015  Nuclear stress There was no ST segment deviation  noted during stress. No T wave inversion was noted during stress. Findings consistent with prior myocardial infarction. This is a low risk study. The left ventricular ejection fraction is mildly decreased (45-54%).   Small inferolateral wall infarct at mid and basal level EF estimated 50% but looks normal no ischemia     Assessment and Plan   1. Chronic diastolic heart failure -overall doing well. Last echo 2015, recent CT PE suggested some RV dysfunction. Will repeat echo.    2. HTN -at goal, continue current meds  3. Bradycardia - chronic sinus bradycardia tthat has been asymptomatic, rates last several visits has been normal - continue to moniotor.    4. Hyperlipidemia - she is at goal, continue current meds  5. Near syncope - reports had not eaten or drank much 2 days prior due to nausea, likely secondary to hypovolemia. No recurrence over 2 months, monitor at this time. If recurrence consider more extensive workup at that time   F/u 6 months     Arnoldo Lenis, M.D.

## 2021-10-23 NOTE — Patient Instructions (Addendum)

## 2021-10-29 ENCOUNTER — Encounter: Payer: Self-pay | Admitting: Family Medicine

## 2021-10-29 ENCOUNTER — Ambulatory Visit (INDEPENDENT_AMBULATORY_CARE_PROVIDER_SITE_OTHER): Payer: Medicare Other | Admitting: Family Medicine

## 2021-10-29 VITALS — BP 105/67 | HR 63 | Temp 97.4°F | Ht 66.0 in | Wt 222.8 lb

## 2021-10-29 DIAGNOSIS — I10 Essential (primary) hypertension: Secondary | ICD-10-CM | POA: Diagnosis not present

## 2021-10-29 DIAGNOSIS — K2100 Gastro-esophageal reflux disease with esophagitis, without bleeding: Secondary | ICD-10-CM | POA: Diagnosis not present

## 2021-10-29 DIAGNOSIS — R7303 Prediabetes: Secondary | ICD-10-CM

## 2021-10-29 DIAGNOSIS — F339 Major depressive disorder, recurrent, unspecified: Secondary | ICD-10-CM

## 2021-10-29 DIAGNOSIS — T148XXA Other injury of unspecified body region, initial encounter: Secondary | ICD-10-CM | POA: Diagnosis not present

## 2021-10-29 DIAGNOSIS — G629 Polyneuropathy, unspecified: Secondary | ICD-10-CM

## 2021-10-29 DIAGNOSIS — E785 Hyperlipidemia, unspecified: Secondary | ICD-10-CM

## 2021-10-29 DIAGNOSIS — J449 Chronic obstructive pulmonary disease, unspecified: Secondary | ICD-10-CM | POA: Diagnosis not present

## 2021-10-29 DIAGNOSIS — I5032 Chronic diastolic (congestive) heart failure: Secondary | ICD-10-CM | POA: Diagnosis not present

## 2021-10-29 DIAGNOSIS — E669 Obesity, unspecified: Secondary | ICD-10-CM

## 2021-10-29 DIAGNOSIS — G2581 Restless legs syndrome: Secondary | ICD-10-CM

## 2021-10-29 DIAGNOSIS — F411 Generalized anxiety disorder: Secondary | ICD-10-CM

## 2021-10-29 DIAGNOSIS — N1831 Chronic kidney disease, stage 3a: Secondary | ICD-10-CM

## 2021-10-29 MED ORDER — FAMOTIDINE 20 MG PO TABS: 20.0000 mg | ORAL_TABLET | Freq: Every day | ORAL | 1 refills | Status: DC

## 2021-10-29 MED ORDER — FUROSEMIDE 20 MG PO TABS: 20.0000 mg | ORAL_TABLET | Freq: Every day | ORAL | 1 refills | Status: DC | PRN

## 2021-10-29 MED ORDER — EMPAGLIFLOZIN 10 MG PO TABS: 10.0000 mg | ORAL_TABLET | Freq: Every evening | ORAL | 1 refills | Status: DC

## 2021-10-29 NOTE — Progress Notes (Signed)
Assessment & Plan:  ***   No follow-ups on file.  [name], MSN, APRN, FNP-C [facility]  Subjective:    Patient ID: Allison Rollins, female    DOB: 1951/03/24, 71 y.o.   MRN: [id number]  Patient Care Team: Loman Brooklyn, FNP as PCP - General (Family Medicine) Harl Bowie Alphonse Guild, MD as PCP - Cardiology (Cardiology) Danie Binder, MD (Inactive) (Gastroenterology) Phillips Odor, MD as Consulting Physician (Neurology) Harl Bowie, Alphonse Guild, MD as Consulting Physician (Cardiology) Ilean China, RN as [place] Management   Chief Complaint:  Chief Complaint  Patient presents with  . Medical Management of Chronic Issues    HPI: Allison Rollins is a 71 y.o. female presenting on 10/29/2021 for Medical Management of Chronic Issues  Depression/anxiety: Patient feels she is well controlled with sertraline.       06/18/2021   10:17 AM 04/03/2021    2:30 PM 03/02/2021    4:10 PM  Depression screen PHQ 2/9  Decreased Interest 1 0 0  Down, Depressed, Hopeless 2 0 0  PHQ - 2 Score 3 0 0  Altered sleeping 1 1 1   Tired, decreased energy 2 1 1   Change in appetite 1 0 0  Feeling bad or failure about yourself  1 0 0  Trouble concentrating 1 0 0  Moving slowly or fidgety/restless 1 3 0  Suicidal thoughts 0 0 0  PHQ-9 Score 10 5 2   Difficult doing work/chores Not difficult at all Not difficult at all Somewhat difficult      06/18/2021   10:17 AM 04/03/2021    2:30 PM 01/01/2021    1:47 PM 09/10/2020    3:34 PM  GAD 7 : Generalized Anxiety Score  Nervous, Anxious, on Edge 1 1 2 2   Control/stop worrying 1 2 1 1   Worry too much - different things 2 1 1 2   Trouble relaxing 2 1 1  0  Restless 1 1 1 2   Easily annoyed or irritable 2 1 1 1   Afraid - awful might happen 0 1 0 0  Total GAD 7 Score 9 8 7 8   Anxiety Difficulty Extremely difficult Not difficult at all Not difficult at all Not difficult at all   Hypertension/CHF: recently saw  cardiology last month.  Hyperlipidemia: taking atorvastatin daily.  CKD: stage 3a. Started Jardiance in April 2023. Previously taking [place] due to CKD and HF.  She had to stop the medication due to vaginal yeast infections with vaginal swelling.  Difficulty sleeping: Patient reports she is taking ZzzQuil at bedtime which is effective.  Restless legs: resolved with addition of Mirapex.   Prediabetes: Most recent A1c 5.8 two months ago.  Vitamin D deficiency: Well-controlled with vitamin D supplement of 1,000 units twice daily.  GERD: Doing well with Protonix 40 mg once daily and famotidine 20 mg at bedtime.  Back pain/Neuropathy: increased Gabapentin in April. She does not feel this has helped much. Most of her pain is at night and we did not previously increase her night time dose.  New complaints: None   Social history:  Relevant past medical, surgical, family and social history reviewed and updated as indicated. Interim medical history since our last visit reviewed.  Allergies and medications reviewed and updated.  DATA REVIEWED: CHART IN EPIC  ROS: Negative unless specifically indicated above in HPI.    Current Outpatient Medications:  .  albuterol (PROVENTIL) (2.5 MG/3ML) 0.083% nebulizer solution, Take 3 mLs (2.5 mg  total) by nebulization every 6 (six) hours as needed for wheezing or shortness of breath., Disp: 150 mL, Rfl: 1 .  albuterol (VENTOLIN HFA) 108 (90 Base) MCG/ACT inhaler, Inhale 2 puffs into the lungs every 6 (six) hours as needed for wheezing or shortness of breath., Disp: 1 each, Rfl: 0 .  Alcohol Swabs (B-D SINGLE USE SWABS REGULAR) PADS, Test sugars daily, Disp: 100 each, Rfl: 3 .  aspirin EC 81 MG tablet, Take 1 tablet (81 mg total) by mouth daily., Disp: , Rfl:  .  atorvastatin (LIPITOR) 40 MG tablet, Take 1 tablet (40 mg total) by mouth daily., Disp: 90 tablet, Rfl: 1 .  cetirizine (ZYRTEC) 10 MG tablet, Take 1 tablet by mouth every day (Patient taking  differently: Take 10 mg by mouth daily.), Disp: 30 tablet, Rfl: 11 .  cholecalciferol (VITAMIN D3) 25 MCG (1000 UT) tablet, Take 1,000 Units by mouth 2 (two) times daily. , Disp: , Rfl:  .  famotidine (PEPCID) 20 MG tablet, Take 1 tablet by mouth at bedtime (Patient taking differently: Take 20 mg by mouth at bedtime.), Disp: 30 tablet, Rfl: 5 .  fluticasone (FLONASE) 50 MCG/ACT nasal spray, Use 2 sprays into each nostril every day, Disp: 16 g, Rfl: 5 .  furosemide (LASIX) 20 MG tablet, Take 1 tablet (20 mg total) by mouth daily as needed., Disp: 90 tablet, Rfl: 1 .  gabapentin (NEURONTIN) 300 MG capsule, Take 2 capsules (600 mg total) by mouth 3 (three) times daily., Disp: 540 capsule, Rfl: 1 .  glucose blood (ACCU-CHEK AVIVA PLUS) test strip, Test sugars daily, Disp: 100 each, Rfl: 3 .  JARDIANCE 10 MG TABS tablet, Take 1 tablet by mouth every day before breakfast (Patient taking differently: Take 10 mg by mouth at bedtime.), Disp: 30 tablet, Rfl: 2 .  lisinopril (ZESTRIL) 10 MG tablet, Take 1 tablet (10 mg total) by mouth daily., Disp: 90 tablet, Rfl: 1 .  methocarbamol (ROBAXIN) 500 MG tablet, Take 1 tablet by mouth 3 times a day as needed for muscle spasms (Patient taking differently: Take 500 mg by mouth 3 (three) times daily as needed for muscle spasms.), Disp: 30 tablet, Rfl: 0 .  Multiple Vitamin (MULTIVITAMIN WITH MINERALS) TABS tablet, Take 1 tablet by mouth daily., Disp: , Rfl:  .  nitroGLYCERIN (NITROSTAT) 0.4 MG SL tablet, Place 1 tablet (0.4 mg total) under the tongue every 5 (five) minutes as needed for chest pain., Disp: 20 tablet, Rfl: 3 .  nystatin (MYCOSTATIN/NYSTOP) powder, Apply 1 application. topically 3 (three) times daily., Disp: 60 g, Rfl: 1 .  Omega-3 Fatty Acids (FISH OIL PO), Take 1 capsule by mouth daily. , Disp: , Rfl:  .  pantoprazole (PROTONIX) 40 MG tablet, Take 1 tablet (40 mg total) by mouth 2 (two) times daily., Disp: 180 tablet, Rfl: 1 .  potassium chloride SA  (K-DUR) 20 MEQ tablet, Take 20 mEq by mouth daily as needed (with lasix)., Disp: , Rfl:  .  pramipexole (MIRAPEX) 0.5 MG tablet, Take 1 tablet (0.5 mg total) by mouth at bedtime., Disp: 90 tablet, Rfl: 1 .  sertraline (ZOLOFT) 100 MG tablet, Take one & one-half tablets by mouth every day (Patient taking differently: Take 150 mg by mouth daily. Take one & one-half tablets by mouth every day), Disp: 45 tablet, Rfl: 5   Allergies  Allergen Reactions  . Farxiga [Dapagliflozin] Swelling  . Penicillins Itching and Rash    Has patient had a PCN reaction causing immediate rash, facial/tongue/throat  swelling, SOB or lightheadedness with hypotension: no Has patient had a PCN reaction causing severe rash involving mucus membranes or skin necrosis: no Has patient had a PCN reaction that required hospitalization: no Has patient had a PCN reaction occurring within the last 10 years: no If all of the above answers are "NO", then may proceed with Cephalosporin use.   . Sulfa Antibiotics Rash   Past Medical History:  Diagnosis Date  . Anxiety   . Asthma   . Chest pain 12/22/2015  . CHF (congestive heart failure) ([place])   . COPD (chronic obstructive pulmonary disease) ([place])   . Depression   . Headache   . HTN (hypertension)   . Hypokalemia 07/28/2013  . Kidney stones   . Osteopenia 01/22/2020  . Parkinson's disease   . Pneumonia   . Pre-diabetes   . S/P colonoscopy 10/15/04   normal  . Vitamin D deficiency 11/24/2018    Past Surgical History:  Procedure Laterality Date  . CHOLECYSTECTOMY    . COLONOSCOPY  10/15/2004   Normal rectum/Normal colon  . COLONOSCOPY N/A 05/10/2016   Procedure: COLONOSCOPY;  Surgeon: Danie Binder, MD;  Location: [facility];  Service: Endoscopy;  Laterality: N/A;  10:30  . ESOPHAGEAL MANOMETRY N/A 04/02/2019   Procedure: ESOPHAGEAL MANOMETRY (EM);  Surgeon: Mauri Pole, MD;  Location: [facility];  Service: Endoscopy;  Laterality: N/A;  .  ESOPHAGOGASTRODUODENOSCOPY  09/11/2010   Dysphagia likely multifactorial (possible Candida esophagitis, likely nonspecific esophageal motility disorder, and/or uncontrolled gastroesophageal reflux disease), status post empiric dilation  . ESOPHAGOGASTRODUODENOSCOPY N/A 11/13/2018   Dr. Oneida Alar: No endoscopic esophageal abnormality to explain patient's dysphagia.  Esophagus dilated.  Moderate erosive/nodular gastritis with benign biopsies.  Marland Kitchen KIDNEY STONE SURGERY    . SAVORY DILATION N/A 11/13/2018   Procedure: SAVORY DILATION;  Surgeon: Danie Binder, MD;  Location: [facility];  Service: Endoscopy;  Laterality: N/A;    Social History   Socioeconomic History  . Marital status: Widowed    Spouse name: Not on file  . Number of children: 2  . Years of education: Not on file  . Highest education level: Not on file  Occupational History  . Occupation: disabled  Tobacco Use  . Smoking status: Former    Packs/day: 0.25    Years: 30.00    Total pack years: 7.50    Types: Cigarettes    Start date: 01/04/1965    Quit date: 01/03/1995    Years since quitting: 26.8  . Smokeless tobacco: Never  Vaping Use  . Vaping Use: Never used  Substance and Sexual Activity  . Alcohol use: No    Alcohol/week: 0.0 standard drinks of alcohol  . Drug use: No  . Sexual activity: Not on file  Other Topics Concern  . Not on file  Social History Narrative   Lives alone. No stairs. Has ramps. Handicap bathroom   Social Determinants of Health   Financial Resource Strain: Low Risk  (03/02/2021)   Overall Financial Resource Strain (CARDIA)   . Difficulty of Paying Living Expenses: Not very hard  Food Insecurity: Food Insecurity Present (03/02/2021)   Hunger Vital Sign   . Worried About Charity fundraiser in the Last Year: Sometimes true   . Ran Out of Food in the Last Year: Never true  Transportation Needs: No Transportation Needs (03/02/2021)   PRAPARE - Transportation   . Lack of Transportation  (Medical): No   . Lack of Transportation (Non-Medical): No  Physical  Activity: Insufficiently Active (03/02/2021)   Exercise Vital Sign   . Days of Exercise per Week: 6 days   . Minutes of Exercise per Session: 20 min  Stress: No Stress Concern Present (03/02/2021)   [place]   . Feeling of Stress : Not at all  Social Connections: Moderately Integrated (03/02/2021)   Social Connection and Isolation Panel [NHANES]   . Frequency of Communication with Friends and Family: More than three times a week   . Frequency of Social Gatherings with Friends and Family: More than three times a week   . Attends Religious Services: More than 4 times per year   . Active Member of Clubs or Organizations: Yes   . Attends Archivist Meetings: More than 4 times per year   . Marital Status: Widowed  Intimate Partner Violence: Not At Risk (03/02/2021)   Humiliation, Afraid, Rape, and Kick questionnaire   . Fear of Current or Ex-Partner: No   . Emotionally Abused: No   . Physically Abused: No   . Sexually Abused: No        Objective:    BP 105/67   Pulse 63   Temp (!) 97.4 F (36.3 C) (Temporal)   Ht 5' 6"  (1.676 m)   Wt 222 lb 12.8 oz (101.1 kg)   SpO2 (!) 88%   BMI 35.96 kg/m   Wt Readings from Last 3 Encounters:  10/29/21 222 lb 12.8 oz (101.1 kg)  10/23/21 229 lb 3.2 oz (104 kg)  09/06/21 234 lb (106.1 kg)    Physical Exam Vitals reviewed.  Constitutional:      General: She is not in acute distress.    Appearance: Normal appearance. She is morbidly obese. She is not ill-appearing, toxic-appearing or diaphoretic.  HENT:     Head: Normocephalic and atraumatic.  Eyes:     General: No scleral icterus.       Right eye: No discharge.        Left eye: No discharge.     Conjunctiva/sclera: Conjunctivae normal.  Cardiovascular:     Rate and Rhythm: Normal rate and regular rhythm.     Heart sounds: Normal heart  sounds. No murmur heard.    No friction rub. No gallop.  Pulmonary:     Effort: Pulmonary effort is normal. No respiratory distress.     Breath sounds: Normal breath sounds. No stridor. No wheezing, rhonchi or rales.  Musculoskeletal:        General: Normal range of motion.     Cervical back: Normal range of motion.     Lumbar back: Tenderness (right side) present. No swelling, edema, deformity, signs of trauma, lacerations, spasms or bony tenderness. Normal range of motion.  Skin:    General: Skin is warm and dry.     Capillary Refill: Capillary refill takes less than 2 seconds.  Neurological:     General: No focal deficit present.     Mental Status: She is alert and oriented to person, place, and time. Mental status is at baseline.     Gait: Gait abnormal (ambulates with a cane).  Psychiatric:        Mood and Affect: Mood normal.        Behavior: Behavior normal.        Thought Content: Thought content normal.        Judgment: Judgment normal.    Lab Results  Component Value Date   TSH 2.420 06/17/2020  Lab Results  Component Value Date   WBC 6.9 09/06/2021   HGB 12.8 09/06/2021   HCT 39.3 09/06/2021   MCV 92.3 09/06/2021   PLT 163 09/06/2021   Lab Results  Component Value Date   NA 141 09/06/2021   K 3.8 09/06/2021   CO2 26 09/06/2021   GLUCOSE 99 09/06/2021   BUN 9 09/06/2021   CREATININE 0.68 09/06/2021   BILITOT 2.2 (H) 08/22/2021   ALKPHOS 91 08/22/2021   AST 84 (H) 08/22/2021   ALT 40 08/22/2021   PROT 6.8 08/22/2021   ALBUMIN 3.9 08/22/2021   CALCIUM 8.4 (L) 09/06/2021   ANIONGAP 6 09/06/2021   EGFR 56 (L) 07/30/2021   Lab Results  Component Value Date   CHOL 172 07/30/2021   Lab Results  Component Value Date   HDL 47 07/30/2021   Lab Results  Component Value Date   LDLCALC 100 (H) 07/30/2021   Lab Results  Component Value Date   TRIG 143 07/30/2021   Lab Results  Component Value Date   CHOLHDL 3.7 07/30/2021   Lab Results  Component  Value Date   HGBA1C 5.8 (H) 09/05/2021

## 2021-11-01 NOTE — Assessment & Plan Note (Signed)
Well-controlled on current regimen. ?

## 2021-11-01 NOTE — Assessment & Plan Note (Signed)
Uncontrolled; patient advised she may try increasing her night time dose of gabapentin to see if this is helpful. She can decrease her daytime dose since she did not find it anymore effective. She understands to make a change by 300 mg every 3-5 days.

## 2021-11-01 NOTE — Assessment & Plan Note (Signed)
Well controlled on current regimen. Managed by cardiology.

## 2021-11-01 NOTE — Assessment & Plan Note (Signed)
Continue current regimen

## 2021-11-01 NOTE — Assessment & Plan Note (Signed)
Well controlled on current regimen. Managed by cardiology. 

## 2021-11-01 NOTE — Assessment & Plan Note (Signed)
Congratulated on 11 lb weight loss over the past three months. Encouraged healthy eating and exercise.

## 2021-11-01 NOTE — Assessment & Plan Note (Signed)
Diet controlled.  

## 2021-11-02 ENCOUNTER — Ambulatory Visit (INDEPENDENT_AMBULATORY_CARE_PROVIDER_SITE_OTHER): Payer: Medicare Other

## 2021-11-02 DIAGNOSIS — R0602 Shortness of breath: Secondary | ICD-10-CM

## 2021-11-02 LAB — ECHOCARDIOGRAM COMPLETE
AR max vel: 1.49 cm2
AV Area VTI: 1.44 cm2
AV Area mean vel: 1.49 cm2
AV Mean grad: 5.8 mmHg
AV Peak grad: 9.7 mmHg
Ao pk vel: 1.55 m/s
Area-P 1/2: 2.65 cm2
Calc EF: 50.3 %
MV M vel: 4.9 m/s
MV Peak grad: 96 mmHg
MV Vena cont: 0.26 cm
Radius: 0.43 cm
S' Lateral: 4.6 cm
Single Plane A2C EF: 48.9 %
Single Plane A4C EF: 50.4 %

## 2021-11-03 ENCOUNTER — Telehealth: Payer: Self-pay | Admitting: Cardiology

## 2021-11-03 NOTE — Telephone Encounter (Signed)
Patient called to get results of Echo test. 

## 2021-11-03 NOTE — Telephone Encounter (Signed)
Will send to provider as they on his desktop awaiting final review.

## 2021-11-03 NOTE — Telephone Encounter (Signed)
Lesle Chris, LPN  5/72/6203  4:56 PM EDT Back to Top    Notified, copy to pcp.    Antoine Poche, MD  11/03/2021  4:17 PM EDT     Echo looks good, overall normal heart functino   Dominga Ferry MD

## 2021-11-11 ENCOUNTER — Other Ambulatory Visit: Payer: Self-pay | Admitting: Family Medicine

## 2021-11-11 DIAGNOSIS — M545 Low back pain, unspecified: Secondary | ICD-10-CM

## 2021-11-16 ENCOUNTER — Ambulatory Visit: Payer: Self-pay | Admitting: *Deleted

## 2021-11-16 DIAGNOSIS — I10 Essential (primary) hypertension: Secondary | ICD-10-CM

## 2021-11-16 NOTE — Chronic Care Management (AMB) (Signed)
  Chronic Care Management   Note  11/16/2021 Name: Allison Rollins MRN: [id number] DOB: 03-02-1951   Patient has either met RN Care Management goals, is stable from RN Care Management perspective, or has not recently engaged with the RN Care Manager. I am removing RN Care Manager from Care Team and closing [place]. If patient is currently engaged with another CCM team member I will forward this encounter to inform them of my case closure. Patient may be eligible for re-engagement with RN Care Manager in the future if necessary and can discuss this with their PCP.  Chong Sicilian, BSN, RN-BC Embedded Chronic Care Manager [facility] / [place] Management Direct Dial: [phone]

## 2021-11-16 NOTE — Patient Instructions (Signed)
Allison Rollins  I have previously worked with you through the Chronic Care Management Program at [facility]. Due to program changes I am removing myself from your care team because you've either met our goals, your conditions are stable and no longer require care management, or we haven't engaged within the past 6 months. If you are currently active with another CCM Team Member, you will remain active with them unless they reach out to you with additional information. If you feel that you need RN Care Management services in the future, please talk with your primary care provider to discuss re-engagement with the RN Care Manager that will be assigned to [facility]. This does not affect your status as a patient at [facility].   Thank you for allowing me to participate in your your healthcare journey.  Chong Sicilian, BSN, RN-BC Embedded Chronic Care Manager [facility] / [place] Management Direct Dial: [phone]

## 2021-11-23 ENCOUNTER — Other Ambulatory Visit: Payer: Self-pay | Admitting: Family Medicine

## 2021-11-23 DIAGNOSIS — Z1231 Encounter for screening mammogram for malignant neoplasm of breast: Secondary | ICD-10-CM

## 2021-11-30 ENCOUNTER — Ambulatory Visit
Admission: RE | Admit: 2021-11-30 | Discharge: 2021-11-30 | Disposition: A | Discharge status: Home / Self Care | Payer: Medicare Other | Source: Ambulatory Visit | Attending: Family Medicine | Admitting: Family Medicine

## 2021-11-30 DIAGNOSIS — Z1231 Encounter for screening mammogram for malignant neoplasm of breast: Secondary | ICD-10-CM | POA: Diagnosis not present

## 2021-12-04 ENCOUNTER — Other Ambulatory Visit: Payer: Self-pay | Admitting: Family Medicine

## 2021-12-04 DIAGNOSIS — G629 Polyneuropathy, unspecified: Secondary | ICD-10-CM

## 2021-12-04 DIAGNOSIS — M545 Low back pain, unspecified: Secondary | ICD-10-CM

## 2021-12-10 ENCOUNTER — Ambulatory Visit: Payer: Medicare Other | Admitting: Cardiology

## 2022-01-03 ENCOUNTER — Other Ambulatory Visit: Payer: Self-pay | Admitting: Family Medicine

## 2022-01-03 DIAGNOSIS — F411 Generalized anxiety disorder: Secondary | ICD-10-CM

## 2022-01-03 DIAGNOSIS — F339 Major depressive disorder, recurrent, unspecified: Secondary | ICD-10-CM

## 2022-01-08 ENCOUNTER — Other Ambulatory Visit: Payer: Self-pay | Admitting: Family Medicine

## 2022-01-08 DIAGNOSIS — J301 Allergic rhinitis due to pollen: Secondary | ICD-10-CM

## 2022-01-20 DIAGNOSIS — R0689 Other abnormalities of breathing: Secondary | ICD-10-CM | POA: Diagnosis not present

## 2022-01-20 DIAGNOSIS — R0789 Other chest pain: Secondary | ICD-10-CM | POA: Diagnosis not present

## 2022-02-02 ENCOUNTER — Other Ambulatory Visit: Payer: Self-pay | Admitting: Family Medicine

## 2022-02-02 ENCOUNTER — Telehealth: Payer: Self-pay | Admitting: Family Medicine

## 2022-02-02 DIAGNOSIS — I10 Essential (primary) hypertension: Secondary | ICD-10-CM

## 2022-02-02 DIAGNOSIS — I5032 Chronic diastolic (congestive) heart failure: Secondary | ICD-10-CM

## 2022-02-02 DIAGNOSIS — K2100 Gastro-esophageal reflux disease with esophagitis, without bleeding: Secondary | ICD-10-CM

## 2022-02-02 DIAGNOSIS — N1831 Chronic kidney disease, stage 3a: Secondary | ICD-10-CM

## 2022-02-03 NOTE — Telephone Encounter (Signed)
Please provide samples if we have any available. I have placed a referral to [facility] for patient assistance with medication costs.

## 2022-02-03 NOTE — Telephone Encounter (Signed)
Left message advising samples placed up front and that referral placed for [facility] to try and help with patient assistance for medication costs.

## 2022-02-08 ENCOUNTER — Encounter: Payer: Medicare Other | Admitting: Family Medicine

## 2022-02-09 ENCOUNTER — Encounter: Payer: Self-pay | Admitting: Family Medicine

## 2022-02-16 ENCOUNTER — Ambulatory Visit (INDEPENDENT_AMBULATORY_CARE_PROVIDER_SITE_OTHER): Payer: Medicare Other | Admitting: Nurse Practitioner

## 2022-02-16 ENCOUNTER — Encounter: Payer: Self-pay | Admitting: Nurse Practitioner

## 2022-02-16 DIAGNOSIS — R051 Acute cough: Secondary | ICD-10-CM | POA: Diagnosis not present

## 2022-02-16 DIAGNOSIS — J029 Acute pharyngitis, unspecified: Secondary | ICD-10-CM

## 2022-02-16 DIAGNOSIS — R509 Fever, unspecified: Secondary | ICD-10-CM

## 2022-02-16 MED ORDER — ACETAMINOPHEN 500 MG PO TABS: 500.0000 mg | ORAL_TABLET | Freq: Four times a day (QID) | ORAL | 0 refills | Status: DC | PRN

## 2022-02-16 MED ORDER — DOXYCYCLINE HYCLATE 100 MG PO TABS: 100.0000 mg | ORAL_TABLET | Freq: Two times a day (BID) | ORAL | 0 refills | Status: DC

## 2022-02-16 MED ORDER — GUAIFENESIN ER 600 MG PO TB12: 600.0000 mg | ORAL_TABLET | Freq: Two times a day (BID) | ORAL | 0 refills | Status: DC

## 2022-02-16 NOTE — Progress Notes (Addendum)
Virtual Visit  Note Due to COVID-19 pandemic this visit was conducted virtually. This visit type was conducted due to national recommendations for restrictions regarding the COVID-19 Pandemic (e.g. social distancing, sheltering in place) in an effort to limit this patient's exposure and mitigate transmission in our community. All issues noted in this document were discussed and addressed.  A physical exam was not performed with this format.  I connected with Allison Rollins on 02/16/22 at 12;00 pm  by telephone and verified that I am speaking with the correct person using two identifiers. Allison Rollins is currently located at home during visit. The provider, Ivy Lynn, NP is located in their office at time of visit.  I discussed the limitations, risks, security and privacy concerns of performing an evaluation and management service by telephone and the availability of in person appointments. I also discussed with the patient that there may be a patient responsible charge related to this service. The patient expressed understanding and agreed to proceed.   History and Present Illness:  Cough This is a new problem. The current episode started yesterday. The problem has been gradually worsening. The problem occurs constantly. The cough is Non-productive. Associated symptoms include a fever, hemoptysis and a sore throat. Pertinent negatives include no chest pain, chills, ear congestion, heartburn, nasal congestion or shortness of breath.  Fever  This is a new problem. The current episode started yesterday. The problem occurs intermittently. The problem has been gradually improving. Her temperature was unmeasured prior to arrival. Associated symptoms include coughing and a sore throat. Pertinent negatives include no chest pain. She has tried nothing for the symptoms. The treatment provided mild relief.  Sore Throat  This is a new problem. Episode onset: in the pase 3-4 days. The problem has been  gradually worsening. The maximum temperature recorded prior to her arrival was 100.4 - 100.9 F. The fever has been present for Less than 1 day. The pain is moderate. Associated symptoms include coughing, a hoarse voice and swollen glands. Pertinent negatives include no shortness of breath. She has had no exposure to strep. She has tried nothing for the symptoms.      Review of Systems  Constitutional:  Positive for fever. Negative for chills.  HENT:  Positive for hoarse voice and sore throat.   Eyes: Negative.   Respiratory:  Positive for cough and hemoptysis. Negative for shortness of breath.   Cardiovascular:  Negative for chest pain.  Gastrointestinal:  Negative for heartburn.  Genitourinary: Negative.   All other systems reviewed and are negative.    Observations/Objective: Tele-visit patient not in distress  Assessment and Plan: Patient presents with cough, sore throat and swollen lymph nodes with fever.  Symptoms present in the past 3 to 4 days.  Advised patient to come by clinic for COVID-19 swab, flu and strep throat.  Patient reports she is unable to leave the house today.  Labs placed for future, advised patient she could come in tomorrow.  Educated patient to: Take meds as prescribed - Use a cool mist humidifier  -Use saline nose sprays frequently -Force fluids -For fever or aches or pains- take Tylenol or ibuprofen. -If symptoms do not improve, she may need to be COVID tested to rule this out   Follow Up Instructions: Follow up with worsening unresolved symptoms     I discussed the assessment and treatment plan with the patient. The patient was provided an opportunity to ask questions and all were answered. The patient  agreed with the plan and demonstrated an understanding of the instructions.   The patient was advised to call back or seek an in-person evaluation if the symptoms worsen or if the condition fails to improve as anticipated.  The above assessment and  management plan was discussed with the patient. The patient verbalized understanding of and has agreed to the management plan. Patient is aware to call the clinic if symptoms persist or worsen. Patient is aware when to return to the clinic for a follow-up visit. Patient educated on when it is appropriate to go to the emergency department.   Time call ended: 12:12 PM  I provided 12 minutes of  non face-to-face time during this encounter.    Daryll Drown, NP

## 2022-02-18 ENCOUNTER — Encounter: Payer: Self-pay | Admitting: *Deleted

## 2022-02-18 ENCOUNTER — Ambulatory Visit (INDEPENDENT_AMBULATORY_CARE_PROVIDER_SITE_OTHER): Payer: Medicare Other | Admitting: *Deleted

## 2022-02-18 DIAGNOSIS — I5032 Chronic diastolic (congestive) heart failure: Secondary | ICD-10-CM

## 2022-02-18 DIAGNOSIS — I10 Essential (primary) hypertension: Secondary | ICD-10-CM

## 2022-02-18 DIAGNOSIS — N1831 Chronic kidney disease, stage 3a: Secondary | ICD-10-CM

## 2022-02-18 NOTE — Chronic Care Management (AMB) (Signed)
Chronic Care Management Provider Comprehensive Care Plan    Name: Allison Rollins MRN: [id number] DOB: 08-26-1950  Referral to Chronic Care Management (CCM) services was placed by Provider:  Marjorie Smolder on Date: 02/03/22.  Interaction and coordination with outside resources, practitioners, and providers See CCM Referral  Chronic Condition 1: Hypertension Provider Assessment and Plan  Essential hypertension       Well controlled on current regimen. Managed by cardiology.        Relevant Medications    furosemide (LASIX) 20 MG tablet     Expected Outcome/Goals Addressed This Visit: (Provider CCM Goals)/Provider Assessment and Plan  Chronic Condition 2: Chronic Heart Failure Provider Assessment and Plan  Chronic diastolic heart failure (Allison Rollins) (Chronic)       Well controlled on current regimen. Managed by cardiology.        Relevant Medications    empagliflozin (JARDIANCE) 10 MG TABS tablet    furosemide (LASIX) 20 MG tablet     Chronic Condition 3: CKD stage 3a Provider Assessment and Plan Stage 3a chronic kidney disease (Allison Rollins) - Primary       Continue current regimen.        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   Achalasia of esophagus    Restless leg 11/24/2018   Erosive gastritis    Seasonal allergic rhinitis due to pollen 07/20/2018   Recurrent depression (Allison Rollins) 12/26/2015   Generalized anxiety disorder 03/12/2015   Prediabetes 03/12/2015   Back pain 03/12/2015   Stress incontinence  01/06/2015   Chronic diastolic heart failure (Allison Rollins) 01/03/2015   Obesity (BMI 30-39.9) 07/01/2011   Essential hypertension 07/01/2011   GERD (gastroesophageal reflux disease) 09/08/2010    Medication Management Outpatient Encounter Medications as of 02/18/2022  Medication Sig   acetaminophen (TYLENOL) 500 MG tablet Take 1 tablet (500 mg total) by mouth every 6 (six) hours as needed.   albuterol (PROVENTIL) (2.5 MG/3ML) 0.083% nebulizer solution Take 3 mLs (2.5 mg total) by nebulization every 6 (six) hours as needed for wheezing or shortness of breath.   albuterol (VENTOLIN HFA) 108 (90 Base) MCG/ACT inhaler Inhale 2 puffs into the lungs every 6 (six) hours as needed for wheezing or shortness of breath.   Alcohol Swabs (B-D SINGLE USE SWABS REGULAR) PADS Test sugars daily   ALLERGY RELIEF CETIRIZINE 10 MG tablet Take 1 tablet by mouth every day   aspirin EC 81 MG tablet Take 1 tablet (81 mg total) by mouth daily.   atorvastatin (LIPITOR) 40 MG tablet Take 1 tablet (40 mg total) by mouth daily.   cholecalciferol (VITAMIN D3) 25 MCG (1000 UT) tablet Take 1,000 Units by mouth 2 (two) times daily.    doxycycline (VIBRA-TABS) 100 MG tablet Take 1 tablet (100 mg total) by mouth 2 (two) times daily.   empagliflozin (JARDIANCE) 10 MG TABS tablet Take 1 tablet (10 mg total) by mouth at bedtime.   famotidine (PEPCID) 20 MG tablet Take 1 tablet (20 mg total) by mouth at bedtime.   fluticasone (FLONASE) 50 MCG/ACT nasal spray  Use 2 sprays into each nostril every day   furosemide (LASIX) 20 MG tablet Take 1 tablet (20 mg total) by mouth daily as needed.   gabapentin (NEURONTIN) 300 MG capsule Take 2 capsules by mouth 3 times a day   glucose blood (ACCU-CHEK AVIVA PLUS) test strip Test sugars daily   guaiFENesin (MUCINEX) 600 MG 12 hr tablet Take 1 tablet (600 mg total) by mouth 2 (two) times daily.   lisinopril (ZESTRIL) 10 MG tablet Take 1 tablet by mouth every day   methocarbamol (ROBAXIN) 500 MG  tablet Take 1 tablet by mouth 3 times a day as needed for muscle spasms   Multiple Vitamin (MULTIVITAMIN WITH MINERALS) TABS tablet Take 1 tablet by mouth daily.   nitroGLYCERIN (NITROSTAT) 0.4 MG SL tablet Place 1 tablet (0.4 mg total) under the tongue every 5 (five) minutes as needed for chest pain.   nystatin (MYCOSTATIN/NYSTOP) powder Apply 1 application. topically 3 (three) times daily.   Omega-3 Fatty Acids (FISH OIL PO) Take 1 capsule by mouth daily.    pantoprazole (PROTONIX) 40 MG tablet Take 1 tablet by mouth twice daily   potassium chloride SA (K-DUR) 20 MEQ tablet Take 20 mEq by mouth daily as needed (with lasix).   pramipexole (MIRAPEX) 0.5 MG tablet Take 1 tablet (0.5 mg total) by mouth at bedtime.   sertraline (ZOLOFT) 100 MG tablet Take one & one-half tablets by mouth every day   No facility-administered encounter medications on file as of 02/18/2022.    Cognitive Assessment Identity Confirmed: [x]  Name; []  DOB Cognitive Status: [x]  Normal []  Abnormal  Functional Status Survey: Is the patient deaf or have difficulty hearing?: No Does the patient have difficulty seeing, even when wearing glasses/contacts?: No Does the patient have difficulty concentrating, remembering, or making decisions?: No Does the patient have difficulty walking or climbing stairs?: Yes (takes extra time) Does the patient have difficulty dressing or bathing?: Yes (has shower chair) Does the patient have difficulty doing errands alone such as visiting a doctor's office or shopping?: No  Caregiver Assessment  Current Living Arrangement: home Living arrangements - the patient lives alone. Caregiver: adult son and another relative Caregiver Relation: Child Status: Stable, limited availability to assist

## 2022-02-18 NOTE — Patient Instructions (Signed)
Please call the care guide team at [phone] if you need to cancel or reschedule your appointment.   If you are experiencing a Mental Health or [place] or need someone to talk to, please call the Suicide and Crisis Lifeline: 988 call the [place] National Suicide Prevention Lifeline: 2153100066 or TTY: 858-163-7351 TTY (979)045-0658) to talk to a trained counselor call 1-800-273-TALK (toll free, 24 hour hotline) go to [facility] [place] ([phone]) call the [place]: [phone] call 911  Low-Sodium Eating Plan Sodium, which is an element that makes up salt, helps you maintain a healthy balance of fluids in your body. Too much sodium can increase your blood pressure and cause fluid and waste to be held in your body. Your health care provider or dietitian may recommend following this plan if you have high blood pressure (hypertension), kidney disease, liver disease, or heart failure. Eating less sodium can help lower your blood pressure, reduce swelling, and protect your heart, liver, and kidneys. What are tips for following this plan? Reading food labels The Nutrition Facts label lists the amount of sodium in one serving of the food. If you eat more than one serving, you must multiply the listed amount of sodium by the number of servings. Choose foods with less than 140 mg of sodium per serving. Avoid foods with 300 mg of sodium or more per serving. Shopping  Look for lower-sodium products, often labeled as "low-sodium" or "no salt added." Always check the sodium content, even if foods are labeled as "unsalted" or "no salt added." Buy fresh foods. Avoid canned foods and pre-made or frozen meals. Avoid canned, cured, or processed meats. Buy breads that have less than 80 mg of sodium per slice. Cooking  Eat more home-cooked food and less restaurant, buffet, and fast food. Avoid adding salt  when cooking. Use salt-free seasonings or herbs instead of table salt or sea salt. Check with your health care provider or pharmacist before using salt substitutes. Cook with plant-based oils, such as canola, sunflower, or olive oil. Meal planning When eating at a restaurant, ask that your food be prepared with less salt or no salt, if possible. Avoid dishes labeled as brined, pickled, cured, smoked, or made with soy sauce, miso, or teriyaki sauce. Avoid foods that contain MSG (monosodium glutamate). MSG is sometimes added to [place] food, bouillon, and some canned foods. Make meals that can be grilled, baked, poached, roasted, or steamed. These are generally made with less sodium. General information Most people on this plan should limit their sodium intake to 1,500-2,000 mg (milligrams) of sodium each day. What foods should I eat? Fruits Fresh, frozen, or canned fruit. Fruit juice. Vegetables Fresh or frozen vegetables. "No salt added" canned vegetables. "No salt added" tomato sauce and paste. Low-sodium or reduced-sodium tomato and vegetable juice. Grains Low-sodium cereals, including oats, puffed wheat and rice, and shredded wheat. Low-sodium crackers. Unsalted rice. Unsalted pasta. Low-sodium bread. Whole-grain breads and whole-grain pasta. Meats and other proteins Fresh or frozen (no salt added) meat, poultry, seafood, and fish. Low-sodium canned tuna and salmon. Unsalted nuts. Dried peas, beans, and lentils without added salt. Unsalted canned beans. Eggs. Unsalted nut butters. Dairy Milk. Soy milk. Cheese that is naturally low in sodium, such as ricotta cheese, fresh mozzarella, or Swiss cheese. Low-sodium or reduced-sodium cheese. Cream cheese. Yogurt. Seasonings and condiments Fresh and dried herbs and spices. Salt-free seasonings. Low-sodium mustard and ketchup. Sodium-free salad dressing. Sodium-free light mayonnaise.  Fresh or refrigerated horseradish. Lemon juice. Vinegar. Other  foods Homemade, reduced-sodium, or low-sodium soups. Unsalted popcorn and pretzels. Low-salt or salt-free chips. The items listed above may not be a complete list of foods and beverages you can eat. Contact a dietitian for more information. What foods should I avoid? Vegetables Sauerkraut, pickled vegetables, and relishes. Olives. [place] fries. Onion rings. Regular canned vegetables (not low-sodium or reduced-sodium). Regular canned tomato sauce and paste (not low-sodium or reduced-sodium). Regular tomato and vegetable juice (not low-sodium or reduced-sodium). Frozen vegetables in sauces. Grains Instant hot cereals. Bread stuffing, pancake, and biscuit mixes. Croutons. Seasoned rice or pasta mixes. Noodle soup cups. Boxed or frozen macaroni and cheese. Regular salted crackers. Self-rising flour. Meats and other proteins Meat or fish that is salted, canned, smoked, spiced, or pickled. Precooked or cured meat, such as sausages or meat loaves. Tomasa Blase. Ham. Pepperoni. Hot dogs. Corned beef. Chipped beef. Salt pork. Jerky. Pickled herring. Anchovies and sardines. Regular canned tuna. Salted nuts. Dairy Processed cheese and cheese spreads. Hard cheeses. Cheese curds. Blue cheese. Feta cheese. String cheese. Regular cottage cheese. Buttermilk. Canned milk. Fats and oils Salted butter. Regular margarine. Ghee. Bacon fat. Seasonings and condiments Onion salt, garlic salt, seasoned salt, table salt, and sea salt. Canned and packaged gravies. Worcestershire sauce. Tartar sauce. Barbecue sauce. Teriyaki sauce. Soy sauce, including reduced-sodium. Steak sauce. Fish sauce. Oyster sauce. Cocktail sauce. Horseradish that you find on the shelf. Regular ketchup and mustard. Meat flavorings and tenderizers. Bouillon cubes. Hot sauce. Pre-made or packaged marinades. Pre-made or packaged taco seasonings. Relishes. Regular salad dressings. Salsa. Other foods Salted popcorn and pretzels. Corn chips and puffs. Potato and  tortilla chips. Canned or dried soups. Pizza. Frozen entrees and pot pies. The items listed above may not be a complete list of foods and beverages you should avoid. Contact a dietitian for more information. Summary Eating less sodium can help lower your blood pressure, reduce swelling, and protect your heart, liver, and kidneys. Most people on this plan should limit their sodium intake to 1,500-2,000 mg (milligrams) of sodium each day. Canned, boxed, and frozen foods are high in sodium. Restaurant foods, fast foods, and pizza are also very high in sodium. You also get sodium by adding salt to food. Try to cook at home, eat more fresh fruits and vegetables, and eat less fast food and canned, processed, or prepared foods. This information is not intended to replace advice given to you by your health care provider. Make sure you discuss any questions you have with your health care provider. Document Revised: 05/18/2019 Document Reviewed: 03/14/2019 Elsevier Patient Education  2023 [employer] Heart Failure Action Plan A heart failure action plan helps you understand what to do when you have symptoms of heart failure. Your action plan is a color-coded plan that lists the symptoms to watch for and indicates what actions to take. If you have symptoms in the red zone, you need medical care right away. If you have symptoms in the yellow zone, you are having problems. If you have symptoms in the green zone, you are doing well. Follow the plan that was created by you and your health care provider. Review your plan each time you visit your health care provider. Red zone These signs and symptoms mean you should get medical help right away: You have trouble breathing when resting. You have a dry cough that is getting worse. You have swelling or pain in your legs or abdomen that is getting worse. You suddenly  gain more than 2-3 lb (0.9-1.4 kg) in 24 hours, or more than 5 lb (2.3 kg) in a week. This amount  may be more or less depending on your condition. You have trouble staying awake or you feel confused. You have chest pain. You do not have an appetite. You pass out. You have worsening sadness or depression. If you have any of these symptoms, call your local emergency services (911 in the [place] right away. Do not drive yourself to the hospital. Yellow zone These signs and symptoms mean your condition may be getting worse and you should make some changes: You have trouble breathing when you are active, or you need to sleep with your head raised on extra pillows to help you breathe. You have swelling in your legs or abdomen. You gain 2-3 lb (0.9-1.4 kg) in 24 hours, or 5 lb (2.3 kg) in a week. This amount may be more or less depending on your condition. You get tired easily. You have trouble sleeping. You have a dry cough. If you have any of these symptoms: Contact your health care provider within the next day. Your health care provider may adjust your medicines. Green zone These signs mean you are doing well and can continue what you are doing: You do not have shortness of breath. You have very little swelling or no new swelling. Your weight is stable (no gain or loss). You have a normal activity level. You do not have chest pain or any other new symptoms. Follow these instructions at home: Take over-the-counter and prescription medicines only as told by your health care provider. Weigh yourself daily. Your target weight is __________ lb (__________ kg). Call your health care provider if you gain more than __________ lb (__________ kg) in 24 hours, or more than __________ lb (__________ kg) in a week. Health care provider name: _____________________________________________________ Health care provider phone number: _____________________________________________________ Eat a heart-healthy diet. Work with a diet and nutrition specialist (dietitian) to create an eating plan that is best  for you. Keep all follow-up visits. This is important. Where to find more information American Heart Association: Summary A heart failure action plan helps you understand what to do when you have symptoms of heart failure. Follow the action plan that was created by you and your health care provider. Get help right away if you have any symptoms in the red zone. This information is not intended to replace advice given to you by your health care provider. Make sure you discuss any questions you have with your health care provider. Document Revised: 07/21/2021 Document Reviewed: 11/26/2019 Elsevier Patient Education  2023 [place] is a copy of your full provider care plan:   Goals Addressed             This Visit's Progress    CCM (CHRONIC KIDNEY DISEASE) EXPECTED OUTCOME: MONITOR, SELF-MANAGE AND REDUCE SYMPTOMS OF CHRONIC KIDNEY DISEASE       Current Barriers:  Knowledge Deficits related to Chronic Kidney Disease Chronic Disease Management support and education needs related to management of Chronic Kidney Disease No Advanced Directives in place- pt declines Patient reports she tries to eat as healthy as she can, does do some light exercise  Planned Interventions: Evaluation of current treatment plan related to chronic kidney disease self management and patient's adherence to plan as established by provider      Reviewed medications with patient and discussed importance of compliance    Counseled on the importance of  exercise goals with target of 150 minutes per week     Advised patient, providing education and rationale, to monitor blood pressure daily and record, calling PCP for findings outside established parameters    Discussed complications of poorly controlled blood pressure such as heart disease, stroke, circulatory complications, vision complications, kidney impairment, sexual dysfunction    Discussed plans with patient for ongoing care management follow up  and provided patient with direct contact information for care management team    Screening for signs and symptoms of depression related to chronic disease state      Assessed social determinant of health barriers    Provided education on kidney disease progression     Symptom Management: Take medications as prescribed   Attend all scheduled provider appointments Call pharmacy for medication refills 3-7 days in advance of running out of medications Attend church or other social activities Perform all self care activities independently  Perform IADL's (shopping, preparing meals, housekeeping, managing finances) independently Call provider office for new concerns or questions  It is important to keep your blood pressure under good control Choose water as your main beverage  Follow Up Plan: Telephone follow up appointment with care management team member scheduled for:  03/25/22 at 130 pm       CCM (CONGESTIVE HEART FAILURE) EXPECTED OUTCOME: MONITOR, SELF-MANAGE AND REDUCE SYMPTOMS OF CONGESTIVE HEART FAILURE       Current Barriers:  Knowledge Deficits related to Congestive Heart Failure Care Coordination needs related to depression in a patient with Congestive Heart Failure Chronic Disease Management support and education needs related to management of Congestive Heart Failure and diet No Advanced Directives in place- pt declines Patient reports she has a scale but not sure it works, will check and if not will consider getting a new scale, patient does not commit to weighing daily.  Patient can benefit from teaching/ reinforcement HF action plan.  Planned Interventions: Basic overview and discussion of pathophysiology of Heart Failure reviewed Provided education on low sodium diet Reviewed Heart Failure Action Plan in depth and provided written copy Assessed need for readable accurate scales in home Provided education about placing scale on hard, flat surface Advised patient to weigh  each morning after emptying bladder Discussed importance of daily weight and advised patient to weigh and record daily Discussed the importance of keeping all appointments with provider Provided patient with education about the role of exercise in the management of heart failure Referral made to community resources care guide team for assistance with food insecurity Screening for signs and symptoms of depression related to chronic disease state  Assessed social determinant of health barriers  Symptom Management: Take medications as prescribed   Attend all scheduled provider appointments Call pharmacy for medication refills 3-7 days in advance of running out of medications Attend church or other social activities Perform all self care activities independently  Perform IADL's (shopping, preparing meals, housekeeping, managing finances) independently Call provider office for new concerns or questions  Work with the social worker to address care coordination needs and will continue to work with the clinical team to address health care and disease management related needs do ankle pumps when sitting keep legs up while sitting use salt in moderation watch for swelling in feet, ankles and legs every day weigh myself daily develop a rescue plan follow rescue plan if symptoms flare-up eat more whole grains, fruits and vegetables, lean meats and healthy fats Look over education mailed- Heart Failure action plan Please start daily  weights and record Get outside and walk if you are able  Follow Up Plan: Telephone follow up appointment with care management team member scheduled for:  03/25/22 at 130 pm       CCM (HYPERTENSION) EXPECTED OUTCOME: MONITOR, SELF-MANAGE AND REDUCE SYMPTOMS OF HYPERTENSION       Current Barriers:  Knowledge Deficits related to Hypertension Chronic Disease Management support and education needs related to management of Hypertension and diet No Advanced Directives in  place- patient declines Patient reports she lives alone, has adult son nearby and another relative than can assist her if needed, pt drives and is overall independent, states she does not like to go up steps, takes extra time, pt reports she has an old blood pressure cuff and wants a new one and will check with insurance provider catalog, pt reports she would like LCSW to outreach her for depression management PHQ9=7 and for interpersonal family relationship difficulties, pt states she gets 23$ per month food stamps and does not always have enough money to buy food  Planned Interventions: Evaluation of current treatment plan related to hypertension self management and patient's adherence to plan as established by provider;   Reviewed prescribed diet low sodium Reviewed medications with patient and discussed importance of compliance;  Counseled on the importance of exercise goals with target of 150 minutes per week Discussed plans with patient for ongoing care management follow up and provided patient with direct contact information for care management team; Advised patient, providing education and rationale, to monitor blood pressure daily and record, calling PCP for findings outside established parameters;  Provided education on prescribed diet low sodium;  Discussed complications of poorly controlled blood pressure such as heart disease, stroke, circulatory complications, vision complications, kidney impairment, sexual dysfunction;  Screening for signs and symptoms of depression related to chronic disease state;  Assessed social determinant of health barriers;  LCSW referral for depression management and interpersonal family conflict issues Referral care guide for food insecurity  Symptom Management: Take medications as prescribed   Attend all scheduled provider appointments Call pharmacy for medication refills 3-7 days in advance of running out of medications Attend church or other social  activities Perform all self care activities independently  Perform IADL's (shopping, preparing meals, housekeeping, managing finances) independently Call provider office for new concerns or questions  check blood pressure 3 times per week choose a place to take my blood pressure (home, clinic or office, retail store) write blood pressure results in a log or diary keep a blood pressure log take blood pressure log to all doctor appointments take medications for blood pressure exactly as prescribed report new symptoms to your doctor eat more whole grains, fruits and vegetables, lean meats and healthy fats Look over education mailed- low sodium diet Call [employer] for information on getting a blood pressure cuff from catalog Social worker will be contacting you for help with depression management Care guide will be contacting you for help with obtaining food  Follow Up Plan: Telephone follow up appointment with care management team member scheduled for: 03/25/22 at 130 pm        The patient verbalized understanding of instructions, educational materials, and care plan provided today and agreed to receive a mailed copy of patient instructions, educational materials, and care plan.   Telephone follow up appointment with care management team member scheduled for: 03/25/22 at 130 pm

## 2022-02-18 NOTE — Chronic Care Management (AMB) (Signed)
ERROR

## 2022-02-18 NOTE — Chronic Care Management (AMB) (Signed)
CCM RN Visit Note   02/18/22 Name: BRYTTANI BLEW MRN: [id number]      DOB: 08-25-1950  Subjective: Allison Rollins is a 71 y.o. year old female who is a primary care patient of Gabriel Earing, FNP. The patient was referred to the Chronic Care Management team for assistance with care management needs subsequent to provider initiation of CCM services and plan of care.      Today's Visit: Engaged with patient by telephone for initial visit.   SDOH Interventions Today    Flowsheet Row Most Recent Value  SDOH Interventions   Food Insecurity Interventions Ambulatory [id number] Order  [referral care guide]  Housing Interventions Intervention Not Indicated  Transportation Interventions Intervention Not Indicated  Utilities Interventions Intervention Not Indicated  Depression Interventions/Treatment  Currently on Treatment  [referral to LCSW]  Financial Strain Interventions Intervention Not Indicated  Physical Activity Interventions Intervention Not Indicated  Social Connections Interventions Intervention Not Indicated        Goals Addressed             This Visit's Progress    CCM (CHRONIC KIDNEY DISEASE) EXPECTED OUTCOME: MONITOR, SELF-MANAGE AND REDUCE SYMPTOMS OF CHRONIC KIDNEY DISEASE       Current Barriers:  Knowledge Deficits related to Chronic Kidney Disease Chronic Disease Management support and education needs related to management of Chronic Kidney Disease No Advanced Directives in place- pt declines Patient reports she tries to eat as healthy as she can, does do some light exercise  Planned Interventions: Evaluation of current treatment plan related to chronic kidney disease self management and patient's adherence to plan as established by provider      Reviewed medications with patient and discussed importance of compliance    Counseled on the importance of exercise goals with target of 150 minutes per week     Advised patient, providing education and rationale, to  monitor blood pressure daily and record, calling PCP for findings outside established parameters    Discussed complications of poorly controlled blood pressure such as heart disease, stroke, circulatory complications, vision complications, kidney impairment, sexual dysfunction    Discussed plans with patient for ongoing care management follow up and provided patient with direct contact information for care management team    Screening for signs and symptoms of depression related to chronic disease state      Assessed social determinant of health barriers    Provided education on kidney disease progression     Symptom Management: Take medications as prescribed   Attend all scheduled provider appointments Call pharmacy for medication refills 3-7 days in advance of running out of medications Attend church or other social activities Perform all self care activities independently  Perform IADL's (shopping, preparing meals, housekeeping, managing finances) independently Call provider office for new concerns or questions  It is important to keep your blood pressure under good control Choose water as your main beverage  Follow Up Plan: Telephone follow up appointment with care management team member scheduled for:  03/25/22 at 130 pm       CCM (CONGESTIVE HEART FAILURE) EXPECTED OUTCOME: MONITOR, SELF-MANAGE AND REDUCE SYMPTOMS OF CONGESTIVE HEART FAILURE       Current Barriers:  Knowledge Deficits related to Congestive Heart Failure Care Coordination needs related to depression in a patient with Congestive Heart Failure Chronic Disease Management support and education needs related to management of Congestive Heart Failure and diet No Advanced Directives in place- pt declines Patient reports she has a scale but  not sure it works, will check and if not will consider getting a new scale, patient does not commit to weighing daily.  Patient can benefit from teaching/ reinforcement HF action  plan.  Planned Interventions: Basic overview and discussion of pathophysiology of Heart Failure reviewed Provided education on low sodium diet Reviewed Heart Failure Action Plan in depth and provided written copy Assessed need for readable accurate scales in home Provided education about placing scale on hard, flat surface Advised patient to weigh each morning after emptying bladder Discussed importance of daily weight and advised patient to weigh and record daily Discussed the importance of keeping all appointments with provider Provided patient with education about the role of exercise in the management of heart failure Referral made to community resources care guide team for assistance with food insecurity Screening for signs and symptoms of depression related to chronic disease state  Assessed social determinant of health barriers  Symptom Management: Take medications as prescribed   Attend all scheduled provider appointments Call pharmacy for medication refills 3-7 days in advance of running out of medications Attend church or other social activities Perform all self care activities independently  Perform IADL's (shopping, preparing meals, housekeeping, managing finances) independently Call provider office for new concerns or questions  Work with the social worker to address care coordination needs and will continue to work with the clinical team to address health care and disease management related needs do ankle pumps when sitting keep legs up while sitting use salt in moderation watch for swelling in feet, ankles and legs every day weigh myself daily develop a rescue plan follow rescue plan if symptoms flare-up eat more whole grains, fruits and vegetables, lean meats and healthy fats Look over education mailed- Heart Failure action plan Please start daily weights and record Get outside and walk if you are able  Follow Up Plan: Telephone follow up appointment with care  management team member scheduled for:  03/25/22 at 130 pm       CCM (HYPERTENSION) EXPECTED OUTCOME: MONITOR, SELF-MANAGE AND REDUCE SYMPTOMS OF HYPERTENSION       Current Barriers:  Knowledge Deficits related to Hypertension Chronic Disease Management support and education needs related to management of Hypertension and diet No Advanced Directives in place- patient declines Patient reports she lives alone, has adult son nearby and another relative than can assist her if needed, pt drives and is overall independent, states she does not like to go up steps, takes extra time, pt reports she has an old blood pressure cuff and wants a new one and will check with insurance provider catalog, pt reports she would like LCSW to outreach her for depression management PHQ9=7 and for interpersonal family relationship difficulties, pt states she gets 23$ per month food stamps and does not always have enough money to buy food  Planned Interventions: Evaluation of current treatment plan related to hypertension self management and patient's adherence to plan as established by provider;   Reviewed prescribed diet low sodium Reviewed medications with patient and discussed importance of compliance;  Counseled on the importance of exercise goals with target of 150 minutes per week Discussed plans with patient for ongoing care management follow up and provided patient with direct contact information for care management team; Advised patient, providing education and rationale, to monitor blood pressure daily and record, calling PCP for findings outside established parameters;  Provided education on prescribed diet low sodium;  Discussed complications of poorly controlled blood pressure such as heart disease, stroke, circulatory  complications, vision complications, kidney impairment, sexual dysfunction;  Screening for signs and symptoms of depression related to chronic disease state;  Assessed social determinant of  health barriers;  LCSW referral for depression management and interpersonal family conflict issues Referral care guide for food insecurity  Symptom Management: Take medications as prescribed   Attend all scheduled provider appointments Call pharmacy for medication refills 3-7 days in advance of running out of medications Attend church or other social activities Perform all self care activities independently  Perform IADL's (shopping, preparing meals, housekeeping, managing finances) independently Call provider office for new concerns or questions  check blood pressure 3 times per week choose a place to take my blood pressure (home, clinic or office, retail store) write blood pressure results in a log or diary keep a blood pressure log take blood pressure log to all doctor appointments take medications for blood pressure exactly as prescribed report new symptoms to your doctor eat more whole grains, fruits and vegetables, lean meats and healthy fats Look over education mailed- low sodium diet Call [employer] for information on getting a blood pressure cuff from catalog Social worker will be contacting you for help with depression management Care guide will be contacting you for help with obtaining food  Follow Up Plan: Telephone follow up appointment with care management team member scheduled for: 03/25/22 at 130 pm       Plan:Telephone follow up appointment with care management team member scheduled for:  03/25/22 at 130 pm  Jacqlyn Larsen [facility], BSN RN Case Manager [place] [phone]

## 2022-02-19 ENCOUNTER — Telehealth: Payer: Self-pay

## 2022-02-19 NOTE — Chronic Care Management (AMB) (Signed)
  Care Coordination   Note   02/19/2022 Name: Allison Rollins MRN: [id number] DOB: 1950-10-27  Allison Rollins is a 71 y.o. year old female who sees Gwenlyn Perking, FNP for primary care. I reached out to Allison Rollins by phone today to offer care coordination services.  [name] was given information about Care Coordination services today including:   The Care Coordination services include support from the care team which includes your Nurse Coordinator, Clinical Social Worker, or Pharmacist.  The Care Coordination team is here to help remove barriers to the health concerns and goals most important to you. Care Coordination services are voluntary, and the patient may decline or stop services at any time by request to their care team member.   Care Coordination Consent Status: Patient agreed to services and verbal consent obtained.   Follow up plan:  Telephone appointment with care coordination team member scheduled for:  03/01/2022  Encounter Outcome:  Pt. Scheduled  Noreene Larsson, [place], [place] [address] Direct Dial: [phone] [email or web address]

## 2022-02-19 NOTE — Progress Notes (Signed)
Pt has been scheduled 03/01/2022

## 2022-02-19 NOTE — Telephone Encounter (Signed)
   Telephone encounter was:  Unsuccessful.  02/19/2022 Name: Allison Rollins MRN: [id number] DOB: July 27, 1950  Unsuccessful outbound call made today to assist with:  Food Insecurity  Outreach Attempt:  1st Attempt  Unable to [place], [place] [facility]  [phone] [address], [place], [place] [address] Phone: [phone] Email: [name]

## 2022-02-22 ENCOUNTER — Telehealth: Payer: Self-pay

## 2022-02-22 NOTE — Telephone Encounter (Signed)
   Telephone encounter was:  Unsuccessful.  02/22/2022 Name: Allison Rollins MRN: [id number] DOB: [birth year]  Unsuccessful outbound call made today to assist with:  Food Insecurity  Outreach Attempt:  2nd Attempt  A HIPAA compliant voice message was left requesting a return call.  Instructed patient to call back   [place], [place] Management  [phone] [address], [place], [place] [address] Phone: [phone] Email: [email or web address]

## 2022-02-23 ENCOUNTER — Telehealth: Payer: Self-pay | Admitting: Family Medicine

## 2022-02-23 DIAGNOSIS — N1831 Chronic kidney disease, stage 3a: Secondary | ICD-10-CM

## 2022-02-23 DIAGNOSIS — I130 Hypertensive heart and chronic kidney disease with heart failure and stage 1 through stage 4 chronic kidney disease, or unspecified chronic kidney disease: Secondary | ICD-10-CM

## 2022-02-23 DIAGNOSIS — I5030 Unspecified diastolic (congestive) heart failure: Secondary | ICD-10-CM

## 2022-02-24 ENCOUNTER — Ambulatory Visit (INDEPENDENT_AMBULATORY_CARE_PROVIDER_SITE_OTHER): Payer: Medicare Other | Admitting: Family Medicine

## 2022-02-24 ENCOUNTER — Telehealth: Payer: Self-pay

## 2022-02-24 ENCOUNTER — Encounter: Payer: Self-pay | Admitting: Family Medicine

## 2022-02-24 VITALS — BP 114/65 | HR 58 | Temp 97.8°F | Ht 66.0 in | Wt 219.5 lb

## 2022-02-24 DIAGNOSIS — J449 Chronic obstructive pulmonary disease, unspecified: Secondary | ICD-10-CM

## 2022-02-24 DIAGNOSIS — I5032 Chronic diastolic (congestive) heart failure: Secondary | ICD-10-CM | POA: Diagnosis not present

## 2022-02-24 DIAGNOSIS — G629 Polyneuropathy, unspecified: Secondary | ICD-10-CM | POA: Diagnosis not present

## 2022-02-24 DIAGNOSIS — G20A1 Parkinson's disease without dyskinesia, without mention of fluctuations: Secondary | ICD-10-CM

## 2022-02-24 DIAGNOSIS — K2100 Gastro-esophageal reflux disease with esophagitis, without bleeding: Secondary | ICD-10-CM | POA: Diagnosis not present

## 2022-02-24 DIAGNOSIS — E782 Mixed hyperlipidemia: Secondary | ICD-10-CM

## 2022-02-24 DIAGNOSIS — Z23 Encounter for immunization: Secondary | ICD-10-CM

## 2022-02-24 DIAGNOSIS — N1831 Chronic kidney disease, stage 3a: Secondary | ICD-10-CM

## 2022-02-24 DIAGNOSIS — R7303 Prediabetes: Secondary | ICD-10-CM | POA: Diagnosis not present

## 2022-02-24 DIAGNOSIS — F411 Generalized anxiety disorder: Secondary | ICD-10-CM

## 2022-02-24 DIAGNOSIS — F339 Major depressive disorder, recurrent, unspecified: Secondary | ICD-10-CM

## 2022-02-24 DIAGNOSIS — G2581 Restless legs syndrome: Secondary | ICD-10-CM | POA: Diagnosis not present

## 2022-02-24 DIAGNOSIS — I10 Essential (primary) hypertension: Secondary | ICD-10-CM | POA: Diagnosis not present

## 2022-02-24 MED ORDER — SERTRALINE HCL 100 MG PO TABS: 200.0000 mg | ORAL_TABLET | Freq: Every day | ORAL | 3 refills | Status: DC

## 2022-02-24 NOTE — Progress Notes (Unsigned)
Established Patient Office Visit  Subjective   Patient ID: Allison Rollins, female    DOB: Jan 07, 1951  Age: 70 y.o. MRN: [id number]  Chief Complaint  Patient presents with   Medical Management of Chronic Issues   Prediabetes   Hyperlipidemia   COPD    HPI  1.Prediabetes She is on jardiance for HF. She tries to avoid sweets. Does not check blood sugars.  She is on a statin.  She is on lisinopril.   2. HTN Complaint with meds - Yes Current Medications - lisinopril, lasix Watching Salt intake - Yes Pertinent ROS:  Headache - No Visual Disturbances - Yes Chest pain - No Dyspnea - No Palpitations - No LE edema - No  Sees cardiology for HF, has an appt coming up. She is also on jardiance, aspirin, and a statin. Also on omega 3.   3. Parkinson's disease She has a history of parkinson's disease. She used to see Jacqlyn Krauss but has not in years. She denies tremor. She does have muscle stiffiness. She takes robaxin prn for this. She does report shuffling gait as well and RLS. She takes mirapex for RLS.   4. Neuropathy She has peripherial neuropathy. She takes gapabenin for this is good relief.   5. COPD She has history of COPD. She uses her albuterol a few times a week prn. She reports this was diagnosed in the hospital.  Denies wheezing, chronic cough, or sputum. She reports dyspnea with activity that is chronic. She does not smoke.   6. GERD Compliant with medications - Yes Current medications - Protonix, pepcid Cough - No Sore throat - No Voice change - No Hemoptysis - No Dysphagia or dyspepsia - No Water brash - No Red Flags (weight loss, hematochezia, melena, weight loss, early satiety, fevers, odynophagia, or persistent vomiting) - No She has had surgery on her esophagus in the past for an esophageal obstruction in 2021. Not established with GI.   7. Anxiety and depression Currently on zoloft 150 mg daily. She reports that her symptoms are not well controlled. Denies  SI.      02/24/2022    2:19 PM 02/18/2022    3:29 PM 06/18/2021   10:17 AM  Depression screen PHQ 2/9  Decreased Interest 3 1 1   Down, Depressed, Hopeless 3 2 2   PHQ - 2 Score 6 3 3   Altered sleeping 0 1 1  Tired, decreased energy 1 2 2   Change in appetite 3 0 1  Feeling bad or failure about yourself  0 0 1  Trouble concentrating 0 1 1  Moving slowly or fidgety/restless 2 0 1  Suicidal thoughts 0 0 0  PHQ-9 Score 12 7 10   Difficult doing work/chores Somewhat difficult Somewhat difficult Not difficult at all      02/24/2022    2:20 PM 06/18/2021   10:17 AM 04/03/2021    2:30 PM 01/01/2021    1:47 PM  GAD 7 : Generalized Anxiety Score  Nervous, Anxious, on Edge 1 1 1 2   Control/stop worrying 1 1 2 1   Worry too much - different things 1 2 1 1   Trouble relaxing 0 2 1 1   Restless 0 1 1 1   Easily annoyed or irritable 0 2 1 1   Afraid - awful might happen 0 0 1 0  Total GAD 7 Score 3 9 8 7   Anxiety Difficulty Not difficult at all Extremely difficult Not difficult at all Not difficult at all  Past Medical History:  Diagnosis Date   Anxiety    Asthma    Chest pain 12/22/2015   CHF (congestive heart failure) (HCC)    COPD (chronic obstructive pulmonary disease) (HCC)    Depression    Headache    HTN (hypertension)    Hypokalemia 07/28/2013   Kidney stones    Osteopenia 01/22/2020   Parkinson's disease    Pneumonia    Pre-diabetes    S/P colonoscopy 10/15/04   normal   Vitamin D deficiency 11/24/2018      ROS As per HPI.   Objective:     BP 114/65   Pulse (!) 58   Temp 97.8 F (36.6 C) (Temporal)   Ht 5\' 6"  (1.676 m)   Wt 219 lb 8 oz (99.6 kg)   BMI 35.43 kg/m  BP Readings from Last 3 Encounters:  02/24/22 114/65  10/29/21 105/67  10/23/21 134/78   Wt Readings from Last 3 Encounters:  02/24/22 219 lb 8 oz (99.6 kg)  10/29/21 222 lb 12.8 oz (101.1 kg)  10/23/21 229 lb 3.2 oz (104 kg)      Physical Exam Vitals and nursing note reviewed.   Constitutional:      General: She is not in acute distress.    Appearance: She is obese. She is not ill-appearing, toxic-appearing or diaphoretic.  HENT:     Head: Normocephalic and atraumatic.  Eyes:     General:        Right eye: No discharge.        Left eye: No discharge.     Conjunctiva/sclera: Conjunctivae normal.     Pupils: Pupils are equal, round, and reactive to light.  Cardiovascular:     Rate and Rhythm: Normal rate and regular rhythm.     Heart sounds: Normal heart sounds. No murmur heard. Pulmonary:     Effort: Pulmonary effort is normal. No respiratory distress.     Breath sounds: Normal breath sounds.  Abdominal:     General: Bowel sounds are normal. There is no distension.     Palpations: Abdomen is soft.     Tenderness: There is no abdominal tenderness. There is no guarding or rebound.  Musculoskeletal:     Right lower leg: No edema.     Left lower leg: No edema.  Skin:    General: Skin is warm and dry.  Neurological:     Mental Status: She is alert and oriented to person, place, and time.     Gait: Gait abnormal (using cane).     Comments: Shuffling gait  Psychiatric:        Mood and Affect: Mood normal.        Behavior: Behavior normal.      No results found for any visits on 02/24/22.  Last CBC Lab Results  Component Value Date   WBC 6.9 09/06/2021   HGB 12.8 09/06/2021   HCT 39.3 09/06/2021   MCV 92.3 09/06/2021   MCH 30.0 09/06/2021   RDW 14.4 09/06/2021   PLT 163 [id number]   Last metabolic panel Lab Results  Component Value Date   GLUCOSE 99 09/06/2021   NA 141 09/06/2021   K 3.8 09/06/2021   CL 109 09/06/2021   CO2 26 09/06/2021   BUN 9 09/06/2021   CREATININE 0.68 09/06/2021   GFRNONAA >60 09/06/2021   CALCIUM 8.4 (L) 09/06/2021   PHOS 3.0 07/30/2013   PROT 6.8 08/22/2021   ALBUMIN 3.9 08/22/2021   LABGLOB 2.1 07/30/2021  AGRATIO 2.0 07/30/2021   BILITOT 2.2 (H) 08/22/2021   ALKPHOS 91 08/22/2021   AST 84 (H)  08/22/2021   ALT 40 08/22/2021   ANIONGAP 6 09/06/2021   Last lipids Lab Results  Component Value Date   CHOL 172 07/30/2021   HDL 47 07/30/2021   LDLCALC 100 (H) 07/30/2021   TRIG 143 07/30/2021   CHOLHDL 3.7 07/30/2021   Last hemoglobin A1c Lab Results  Component Value Date   HGBA1C 5.8 (H) 09/05/2021   Last thyroid functions Lab Results  Component Value Date   TSH 2.420 06/17/2020   T4TOTAL 6.4 06/17/2020      The 10-year ASCVD risk score (Arnett DK, et al., 2019) is: 22.4%    Assessment & Plan:   Problem List Items Addressed This Visit       Cardiovascular and Mediastinum   Chronic diastolic heart failure (HCC) (Chronic)   Essential hypertension - Primary     Respiratory   Chronic obstructive pulmonary disease (HCC)     Digestive   Gastroesophageal reflux disease with esophagitis     Nervous and Auditory   Neuropathy     Genitourinary   Stage 3a chronic kidney disease (HCC)     Other   Mixed hyperlipidemia   Generalized anxiety disorder   Recurrent depression ([place])   Morbid obesity ([place])    No follow-ups on file.    Gwenlyn Perking, FNP

## 2022-02-24 NOTE — Chronic Care Management (AMB) (Signed)
  Chronic Care Management   Outreach Note  02/24/2022 Name: Allison Rollins MRN: [id number] DOB: 06/01/1950  Allison Rollins is a 71 y.o. year old female who is a primary care patient of Gwenlyn Perking, FNP. I reached out to Allison Rollins by phone today in response to a referral sent by Ms. Juanita Craver Caldas's primary care provider.  An unsuccessful telephone outreach was attempted today to contact the patient about Chronic Care Management needs.    Follow Up Plan: A HIPAA compliant phone message was left for the patient providing contact information and requesting a return call.  The care management team will reach out to the patient again over the next 7 days.  If patient returns call to provider office, please advise to call [place]* at [phone]*  Noreene Larsson, [place],  [address] Direct Dial: [phone] [email or web address]

## 2022-02-24 NOTE — Telephone Encounter (Signed)
Pt has appt today and PCP will order any labs that need to be drawn. Will close encounter.

## 2022-02-24 NOTE — Telephone Encounter (Signed)
   Telephone encounter was:  Successful.  02/24/2022 Name: ROSS HEFFERAN MRN: [id number] DOB: 21-Nov-1950  [name] is a 71 y.o. year old female who is a primary care patient of Gwenlyn Perking, FNP . The community resource team was consulted for assistance with Food Insecurity and Financial Difficulties related to financial strain  Care guide performed the following interventions: Patient provided with information about care guide support team and interviewed to confirm resource needs.  Follow Up Plan:  No further follow up planned at this time. The patient has been provided with needed resources.    [place], Care Management  [phone] [address], [place], [place] [address] Phone: [phone] Email: [email or web address]

## 2022-02-25 LAB — LIPID PANEL
Chol/HDL Ratio: 4.1 ratio (ref 0.0–4.4)
Cholesterol, Total: 158 mg/dL (ref 100–199)
HDL: 39 mg/dL — ABNORMAL LOW (ref >39)
LDL Chol Calc (NIH): 85 mg/dL (ref 0–99)
Triglycerides: 203 mg/dL — ABNORMAL HIGH (ref 0–149)
VLDL Cholesterol Cal: 34 mg/dL (ref 5–40)

## 2022-02-25 LAB — CBC WITH DIFFERENTIAL/PLATELET
Basophils Absolute: 0 10*3/uL (ref 0.0–0.2)
Basos: 0 %
EOS (ABSOLUTE): 0.1 10*3/uL (ref 0.0–0.4)
Eos: 2 %
Hematocrit: 46.5 % (ref 34.0–46.6)
Hemoglobin: 15.4 g/dL (ref 11.1–15.9)
Immature Grans (Abs): 0 10*3/uL (ref 0.0–0.1)
Immature Granulocytes: 0 %
Lymphocytes Absolute: 1.1 10*3/uL (ref 0.7–3.1)
Lymphs: 14 %
MCH: 29.2 pg (ref 26.6–33.0)
MCHC: 33.1 g/dL (ref 31.5–35.7)
MCV: 88 fL (ref 79–97)
Monocytes Absolute: 0.5 10*3/uL (ref 0.1–0.9)
Monocytes: 6 %
Neutrophils Absolute: 6 10*3/uL (ref 1.4–7.0)
Neutrophils: 78 %
Platelets: 222 10*3/uL (ref 150–450)
RBC: 5.27 x10E6/uL (ref 3.77–5.28)
RDW: 13.8 % (ref 11.7–15.4)
WBC: 7.7 10*3/uL (ref 3.4–10.8)

## 2022-02-25 LAB — CMP14+EGFR
ALT: 14 IU/L (ref 0–32)
AST: 23 IU/L (ref 0–40)
Albumin/Globulin Ratio: 1.9 (ref 1.2–2.2)
Albumin: 4.1 g/dL (ref 3.8–4.8)
Alkaline Phosphatase: 102 IU/L (ref 44–121)
BUN/Creatinine Ratio: 14 (ref 12–28)
BUN: 12 mg/dL (ref 8–27)
Bilirubin Total: 0.4 mg/dL (ref 0.0–1.2)
CO2: 25 mmol/L (ref 20–29)
Calcium: 9.6 mg/dL (ref 8.7–10.3)
Chloride: 102 mmol/L (ref 96–106)
Creatinine, Ser: 0.85 mg/dL (ref 0.57–1.00)
Globulin, Total: 2.2 g/dL (ref 1.5–4.5)
Glucose: 96 mg/dL (ref 70–99)
Potassium: 4.2 mmol/L (ref 3.5–5.2)
Sodium: 142 mmol/L (ref 134–144)
Total Protein: 6.3 g/dL (ref 6.0–8.5)
eGFR: 73 mL/min/{1.73_m2} (ref >59)

## 2022-02-25 LAB — TSH: TSH: 1 u[IU]/mL (ref 0.450–4.500)

## 2022-02-25 LAB — IRON,TIBC AND FERRITIN PANEL
Ferritin: 163 ng/mL — ABNORMAL HIGH (ref 15–150)
Iron Saturation: 21 % (ref 15–55)
Iron: 51 ug/dL (ref 27–139)
Total Iron Binding Capacity: 241 ug/dL — ABNORMAL LOW (ref 250–450)
UIBC: 190 ug/dL (ref 118–369)

## 2022-02-25 LAB — BAYER DCA HB A1C WAIVED: HB A1C (BAYER DCA - WAIVED): 5.8 % — ABNORMAL HIGH (ref 4.8–5.6)

## 2022-02-26 ENCOUNTER — Telehealth: Payer: Self-pay | Admitting: Family Medicine

## 2022-02-26 LAB — MICROALBUMIN / CREATININE URINE RATIO
Creatinine, Urine: 103.7 mg/dL
Microalb/Creat Ratio: 10 mg/g creat (ref 0–29)
Microalbumin, Urine: 10.1 ug/mL

## 2022-02-26 NOTE — Telephone Encounter (Signed)
Pt is aware Jonelle Sidle is out of office sick but pt was concerned about her kidney function so I did tell pt that according to her labs her kidney functions was in the normal range on the CMP but Tiffany would still need to review all labs and make any recommendations. Pt voiced understanding.

## 2022-03-01 ENCOUNTER — Ambulatory Visit: Payer: Self-pay | Admitting: *Deleted

## 2022-03-01 NOTE — Patient Outreach (Signed)
  Care Coordination   03/01/2022  Name: Allison Rollins MRN: [id number] DOB: [birth year]   Care Coordination Outreach Attempts:  An unsuccessful telephone outreach was attempted today to offer the patient information about available care coordination services as a benefit of their health plan. HIPAA compliant message left on voicemail, providing contact information for CSW, encouraging patient to return CSW's call at her earliest convenience.  Follow Up Plan:  Additional outreach attempts will be made to offer the patient care coordination information and services.   Encounter Outcome:  No Answer.   Care Coordination Interventions Activated:  No.    Care Coordination Interventions:  No, not indicated.    Nat Christen, BSW, MSW, LCSW  Licensed Education officer, [facility]  Mailing [place] [address], [place], [place] [address] Physical Address-[address], [place], [place] [address] Toll Free Main # [phone] Fax # [phone] Cell # [phone] [email or web address]

## 2022-03-04 ENCOUNTER — Other Ambulatory Visit: Payer: Self-pay | Admitting: Family Medicine

## 2022-03-04 DIAGNOSIS — G629 Polyneuropathy, unspecified: Secondary | ICD-10-CM

## 2022-03-04 DIAGNOSIS — G2581 Restless legs syndrome: Secondary | ICD-10-CM

## 2022-03-04 DIAGNOSIS — M545 Low back pain, unspecified: Secondary | ICD-10-CM

## 2022-03-04 NOTE — Progress Notes (Signed)
Patient returning call. Please call back

## 2022-03-11 ENCOUNTER — Telehealth: Payer: Self-pay | Admitting: *Deleted

## 2022-03-11 NOTE — Telephone Encounter (Signed)
TC from [facility] RE: pt's Jardiance Rx Pt can not afford Jardiance and as been getting samples from the office. They are wanting to know if they need to cancel her Rx or is this going to be changed to another medication? Please call back Ref# [id number] TC on 02/02/22 Tiffany notes referral to Raynelle Fanning to help with patient assistance for medication costs.  Do you know anything about this, or what may have happened to the referral

## 2022-03-12 ENCOUNTER — Telehealth: Payer: Self-pay | Admitting: Family Medicine

## 2022-03-12 NOTE — Telephone Encounter (Signed)
Allison Rollins is on her allergy list, so yes, can cancel

## 2022-03-15 NOTE — Telephone Encounter (Signed)
I spoke to [facility] and made them aware they can cancel the rx as we are trying to get her samples and patient assistance.

## 2022-03-22 ENCOUNTER — Other Ambulatory Visit: Payer: Self-pay | Admitting: Family Medicine

## 2022-03-22 ENCOUNTER — Ambulatory Visit (INDEPENDENT_AMBULATORY_CARE_PROVIDER_SITE_OTHER): Payer: Medicare Other

## 2022-03-22 DIAGNOSIS — Z780 Asymptomatic menopausal state: Secondary | ICD-10-CM

## 2022-03-22 DIAGNOSIS — M85832 Other specified disorders of bone density and structure, left forearm: Secondary | ICD-10-CM | POA: Diagnosis not present

## 2022-03-24 ENCOUNTER — Ambulatory Visit (INDEPENDENT_AMBULATORY_CARE_PROVIDER_SITE_OTHER): Payer: Medicare Other

## 2022-03-24 VITALS — Ht 66.0 in | Wt 222.0 lb

## 2022-03-24 DIAGNOSIS — Z0000 Encounter for general adult medical examination without abnormal findings: Secondary | ICD-10-CM | POA: Diagnosis not present

## 2022-03-24 NOTE — Patient Instructions (Signed)
Allison Rollins , Thank you for taking time to come for your Medicare Wellness Visit. I appreciate your ongoing commitment to your health goals. Please review the following plan we discussed and let me know if I can assist you in the future.   These are the goals we discussed:  Goals      CCM (CHRONIC KIDNEY DISEASE) EXPECTED OUTCOME: MONITOR, SELF-MANAGE AND REDUCE SYMPTOMS OF CHRONIC KIDNEY DISEASE     Current Barriers:  Knowledge Deficits related to Chronic Kidney Disease Chronic Disease Management support and education needs related to management of Chronic Kidney Disease No Advanced Directives in place- pt declines Patient reports she tries to eat as healthy as she can, does do some light exercise  Planned Interventions: Evaluation of current treatment plan related to chronic kidney disease self management and patient's adherence to plan as established by provider      Reviewed medications with patient and discussed importance of compliance    Counseled on the importance of exercise goals with target of 150 minutes per week     Advised patient, providing education and rationale, to monitor blood pressure daily and record, calling PCP for findings outside established parameters    Discussed complications of poorly controlled blood pressure such as heart disease, stroke, circulatory complications, vision complications, kidney impairment, sexual dysfunction    Discussed plans with patient for ongoing care management follow up and provided patient with direct contact information for care management team    Screening for signs and symptoms of depression related to chronic disease state      Assessed social determinant of health barriers    Provided education on kidney disease progression     Symptom Management: Take medications as prescribed   Attend all scheduled provider appointments Call pharmacy for medication refills 3-7 days in advance of running out of medications Attend church or other  social activities Perform all self care activities independently  Perform IADL's (shopping, preparing meals, housekeeping, managing finances) independently Call provider office for new concerns or questions  It is important to keep your blood pressure under good control Choose water as your main beverage  Follow Up Plan: Telephone follow up appointment with care management team member scheduled for:  03/25/22 at 130 pm       CCM (CONGESTIVE HEART FAILURE) EXPECTED OUTCOME: MONITOR, SELF-MANAGE AND REDUCE SYMPTOMS OF CONGESTIVE HEART FAILURE     Current Barriers:  Knowledge Deficits related to Congestive Heart Failure Care Coordination needs related to depression in a patient with Congestive Heart Failure Chronic Disease Management support and education needs related to management of Congestive Heart Failure and diet No Advanced Directives in place- pt declines Patient reports she has a scale but not sure it works, will check and if not will consider getting a new scale, patient does not commit to weighing daily.  Patient can benefit from teaching/ reinforcement HF action plan.  Planned Interventions: Basic overview and discussion of pathophysiology of Heart Failure reviewed Provided education on low sodium diet Reviewed Heart Failure Action Plan in depth and provided written copy Assessed need for readable accurate scales in home Provided education about placing scale on hard, flat surface Advised patient to weigh each morning after emptying bladder Discussed importance of daily weight and advised patient to weigh and record daily Discussed the importance of keeping all appointments with provider Provided patient with education about the role of exercise in the management of heart failure Referral made to community resources care guide team for assistance with food  insecurity Screening for signs and symptoms of depression related to chronic disease state  Assessed social determinant of  health barriers  Symptom Management: Take medications as prescribed   Attend all scheduled provider appointments Call pharmacy for medication refills 3-7 days in advance of running out of medications Attend church or other social activities Perform all self care activities independently  Perform IADL's (shopping, preparing meals, housekeeping, managing finances) independently Call provider office for new concerns or questions  Work with the social worker to address care coordination needs and will continue to work with the clinical team to address health care and disease management related needs do ankle pumps when sitting keep legs up while sitting use salt in moderation watch for swelling in feet, ankles and legs every day weigh myself daily develop a rescue plan follow rescue plan if symptoms flare-up eat more whole grains, fruits and vegetables, lean meats and healthy fats Look over education mailed- Heart Failure action plan Please start daily weights and record Get outside and walk if you are able  Follow Up Plan: Telephone follow up appointment with care management team member scheduled for:  03/25/22 at 130 pm       CCM (HYPERTENSION) EXPECTED OUTCOME: MONITOR, SELF-MANAGE AND REDUCE SYMPTOMS OF HYPERTENSION     Current Barriers:  Knowledge Deficits related to Hypertension Chronic Disease Management support and education needs related to management of Hypertension and diet No Advanced Directives in place- patient declines Patient reports she lives alone, has adult son nearby and another relative than can assist her if needed, pt drives and is overall independent, states she does not like to go up steps, takes extra time, pt reports she has an old blood pressure cuff and wants a new one and will check with insurance provider catalog, pt reports she would like LCSW to outreach her for depression management PHQ9=7 and for interpersonal family relationship difficulties, pt states  she gets 23$ per month food stamps and does not always have enough money to buy food  Planned Interventions: Evaluation of current treatment plan related to hypertension self management and patient's adherence to plan as established by provider;   Reviewed prescribed diet low sodium Reviewed medications with patient and discussed importance of compliance;  Counseled on the importance of exercise goals with target of 150 minutes per week Discussed plans with patient for ongoing care management follow up and provided patient with direct contact information for care management team; Advised patient, providing education and rationale, to monitor blood pressure daily and record, calling PCP for findings outside established parameters;  Provided education on prescribed diet low sodium;  Discussed complications of poorly controlled blood pressure such as heart disease, stroke, circulatory complications, vision complications, kidney impairment, sexual dysfunction;  Screening for signs and symptoms of depression related to chronic disease state;  Assessed social determinant of health barriers;  LCSW referral for depression management and interpersonal family conflict issues Referral care guide for food insecurity  Symptom Management: Take medications as prescribed   Attend all scheduled provider appointments Call pharmacy for medication refills 3-7 days in advance of running out of medications Attend church or other social activities Perform all self care activities independently  Perform IADL's (shopping, preparing meals, housekeeping, managing finances) independently Call provider office for new concerns or questions  check blood pressure 3 times per week choose a place to take my blood pressure (home, clinic or office, retail store) write blood pressure results in a log or diary keep a blood pressure log take  blood pressure log to all doctor appointments take medications for blood pressure  exactly as prescribed report new symptoms to your doctor eat more whole grains, fruits and vegetables, lean meats and healthy fats Look over education mailed- low sodium diet Call [facility] for information on getting a blood pressure cuff from catalog Social worker will be contacting you for help with depression management Care guide will be contacting you for help with obtaining food  Follow Up Plan: Telephone follow up appointment with care management team member scheduled for: 03/25/22 at 130 pm       Exercise 3x per week (30 min per time)     Exercise at the [place] [employer] 3 times per week for 30 minutes each session.     Exercise 3x per week (30 min per time)     Prevent Falls     Timeframe:  Long-Range Goal Priority:  High Start Date:     07/25/20                        Expected End Date:    08/24/21                   Follow-up: 09/12/20  Use walker for ambulation Keep cell phone with her at all times Move carefully and change positions slowly Attend all physical therapy sessions Call RN Care Manager as needed [phone]        This is a list of the screening recommended for you and due dates:  Health Maintenance  Topic Date Due   COVID-19 Vaccine (5 - 2023-24 season) 03/13/2023*   Zoster (Shingles) Vaccine (1 of 2) 05/28/2023*   Mammogram  12/01/2022   Medicare Annual Wellness Visit  03/25/2023   DEXA scan (bone density measurement)  03/22/2024   Colon Cancer Screening  05/10/2026   Pneumonia Vaccine  Completed   Flu Shot  Completed   Hepatitis C Screening: USPSTF Recommendation to screen - Ages 18-79 yo.  Completed   HPV Vaccine  Aged Out  *Topic was postponed. The date shown is not the original due date.    Advanced directives: Advance directive discussed with you today. I have provided a copy for you to complete at home and have notarized. Once this is complete please bring a copy in to our office so we can scan it into your chart.    Conditions/risks identified: Aim for 30 minutes of exercise or brisk walking, 6-8 glasses of water, and 5 servings of fruits and vegetables each day.   Next appointment: Follow up in one year for your annual wellness visit    Preventive Care 65 Years and Older, Female Preventive care refers to lifestyle choices and visits with your health care provider that can promote health and wellness. What does preventive care include? A yearly physical exam. This is also called an annual well check. Dental exams once or twice a year. Routine eye exams. Ask your health care provider how often you should have your eyes checked. Personal lifestyle choices, including: Daily care of your teeth and gums. Regular physical activity. Eating a healthy diet. Avoiding tobacco and drug use. Limiting alcohol use. Practicing safe sex. Taking low-dose aspirin every day. Taking vitamin and mineral supplements as recommended by your health care provider. What happens during an annual well check? The services and screenings done by your health care provider during your annual well check will depend on your age, overall health, lifestyle risk factors, and  family history of disease. Counseling  Your health care provider may ask you questions about your: Alcohol use. Tobacco use. Drug use. Emotional well-being. Home and relationship well-being. Sexual activity. Eating habits. History of falls. Memory and ability to understand (cognition). Work and work Astronomer. Reproductive health. Screening  You may have the following tests or measurements: Height, weight, and BMI. Blood pressure. Lipid and cholesterol levels. These may be checked every 5 years, or more frequently if you are over 61 years old. Skin check. Lung cancer screening. You may have this screening every year starting at age 14 if you have a 30-pack-year history of smoking and currently smoke or have quit within the past 15 years. Fecal  occult blood test (FOBT) of the stool. You may have this test every year starting at age 11. Flexible sigmoidoscopy or colonoscopy. You may have a sigmoidoscopy every 5 years or a colonoscopy every 10 years starting at age 17. Hepatitis C blood test. Hepatitis B blood test. Sexually transmitted disease (STD) testing. Diabetes screening. This is done by checking your blood sugar (glucose) after you have not eaten for a while (fasting). You may have this done every 1-3 years. Bone density scan. This is done to screen for osteoporosis. You may have this done starting at age 41. Mammogram. This may be done every 1-2 years. Talk to your health care provider about how often you should have regular mammograms. Talk with your health care provider about your test results, treatment options, and if necessary, the need for more tests. Vaccines  Your health care provider may recommend certain vaccines, such as: Influenza vaccine. This is recommended every year. Tetanus, diphtheria, and acellular pertussis (Tdap, Td) vaccine. You may need a Td booster every 10 years. Zoster vaccine. You may need this after age 44. Pneumococcal 13-valent conjugate (PCV13) vaccine. One dose is recommended after age 57. Pneumococcal polysaccharide (PPSV23) vaccine. One dose is recommended after age 97. Talk to your health care provider about which screenings and vaccines you need and how often you need them. This information is not intended to replace advice given to you by your health care provider. Make sure you discuss any questions you have with your health care provider. Document Released: 05/09/2015 Document Revised: 12/31/2015 Document Reviewed: 02/11/2015 Elsevier Interactive Patient Education  2017 [employer].  Fall Prevention in the Home Falls can cause injuries. They can happen to people of all ages. There are many things you can do to make your home safe and to help prevent falls. What can I do on the outside  of my home? Regularly fix the edges of walkways and driveways and fix any cracks. Remove anything that might make you trip as you walk through a door, such as a raised step or threshold. Trim any bushes or trees on the path to your home. Use bright outdoor lighting. Clear any walking paths of anything that might make someone trip, such as rocks or tools. Regularly check to see if handrails are loose or broken. Make sure that both sides of any steps have handrails. Any raised decks and porches should have guardrails on the edges. Have any leaves, snow, or ice cleared regularly. Use sand or salt on walking paths during winter. Clean up any spills in your garage right away. This includes oil or grease spills. What can I do in the bathroom? Use night lights. Install grab bars by the toilet and in the tub and shower. Do not use towel bars as grab bars. Use non-skid  mats or decals in the tub or shower. If you need to sit down in the shower, use a plastic, non-slip stool. Keep the floor dry. Clean up any water that spills on the floor as soon as it happens. Remove soap buildup in the tub or shower regularly. Attach bath mats securely with double-sided non-slip rug tape. Do not have throw rugs and other things on the floor that can make you trip. What can I do in the bedroom? Use night lights. Make sure that you have a light by your bed that is easy to reach. Do not use any sheets or blankets that are too big for your bed. They should not hang down onto the floor. Have a firm chair that has side arms. You can use this for support while you get dressed. Do not have throw rugs and other things on the floor that can make you trip. What can I do in the kitchen? Clean up any spills right away. Avoid walking on wet floors. Keep items that you use a lot in easy-to-reach places. If you need to reach something above you, use a strong step stool that has a grab bar. Keep electrical cords out of the  way. Do not use floor polish or wax that makes floors slippery. If you must use wax, use non-skid floor wax. Do not have throw rugs and other things on the floor that can make you trip. What can I do with my stairs? Do not leave any items on the stairs. Make sure that there are handrails on both sides of the stairs and use them. Fix handrails that are broken or loose. Make sure that handrails are as long as the stairways. Check any carpeting to make sure that it is firmly attached to the stairs. Fix any carpet that is loose or worn. Avoid having throw rugs at the top or bottom of the stairs. If you do have throw rugs, attach them to the floor with carpet tape. Make sure that you have a light switch at the top of the stairs and the bottom of the stairs. If you do not have them, ask someone to add them for you. What else can I do to help prevent falls? Wear shoes that: Do not have high heels. Have rubber bottoms. Are comfortable and fit you well. Are closed at the toe. Do not wear sandals. If you use a stepladder: Make sure that it is fully opened. Do not climb a closed stepladder. Make sure that both sides of the stepladder are locked into place. Ask someone to hold it for you, if possible. Clearly mark and make sure that you can see: Any grab bars or handrails. First and last steps. Where the edge of each step is. Use tools that help you move around (mobility aids) if they are needed. These include: Canes. Walkers. Scooters. Crutches. Turn on the lights when you go into a dark area. Replace any light bulbs as soon as they burn out. Set up your furniture so you have a clear path. Avoid moving your furniture around. If any of your floors are uneven, fix them. If there are any pets around you, be aware of where they are. Review your medicines with your doctor. Some medicines can make you feel dizzy. This can increase your chance of falling. Ask your doctor what other things that you can  do to help prevent falls. This information is not intended to replace advice given to you by your health care provider.  Make sure you discuss any questions you have with your health care provider. Document Released: 02/06/2009 Document Revised: 09/18/2015 Document Reviewed: 05/17/2014 Elsevier Interactive Patient Education  2017 [employer].

## 2022-03-24 NOTE — Progress Notes (Signed)
Subjective:   Allison Rollins is a 71 y.o. female who presents for Medicare Annual (Subsequent) preventive examination. I connected with  [name] on 03/24/22 by a audio enabled telemedicine application and verified that I am speaking with the correct person using two identifiers.  Patient Location: Home  Provider Location: Home Office  I discussed the limitations of evaluation and management by telemedicine. The patient expressed understanding and agreed to proceed.  Review of Systems     Cardiac Risk Factors include: advanced age (>73men, >68 women);hypertension;dyslipidemia     Objective:    Today's Vitals   03/24/22 1113  Weight: 222 lb (100.7 kg)  Height:  (1.676 m)   Body mass index is 35.83 kg/m.     03/24/2022   11:18 AM 09/06/2021    1:00 AM 09/06/2021   12:00 AM 09/05/2021    2:12 PM 08/22/2021   10:03 PM 03/02/2021    4:12 PM 06/26/2020    9:47 PM  Advanced Directives  Does Patient Have a Medical Advance Directive? No  No No No No No  Would patient like information on creating a medical advance directive? No - Patient declined No - Patient declined    No - Patient declined     Current Medications (verified) Outpatient Encounter Medications as of 03/24/2022  Medication Sig   acetaminophen (TYLENOL) 500 MG tablet Take 1 tablet (500 mg total) by mouth every 6 (six) hours as needed.   albuterol (PROVENTIL) (2.5 MG/3ML) 0.083% nebulizer solution Take 3 mLs (2.5 mg total) by nebulization every 6 (six) hours as needed for wheezing or shortness of breath.   albuterol (VENTOLIN HFA) 108 (90 Base) MCG/ACT inhaler Inhale 2 puffs into the lungs every 6 (six) hours as needed for wheezing or shortness of breath.   Alcohol Swabs (B-D SINGLE USE SWABS REGULAR) PADS Test sugars daily   ALLERGY RELIEF CETIRIZINE 10 MG tablet Take 1 tablet by mouth every day   aspirin EC 81 MG tablet Take 1 tablet (81 mg total) by mouth daily.   cholecalciferol (VITAMIN D3) 25 MCG (1000  UT) tablet Take 1,000 Units by mouth 2 (two) times daily.    empagliflozin (JARDIANCE) 10 MG TABS tablet Take 1 tablet (10 mg total) by mouth at bedtime.   famotidine (PEPCID) 20 MG tablet Take 1 tablet (20 mg total) by mouth at bedtime.   fluticasone (FLONASE) 50 MCG/ACT nasal spray Use 2 sprays into each nostril every day   furosemide (LASIX) 20 MG tablet Take 1 tablet (20 mg total) by mouth daily as needed.   gabapentin (NEURONTIN) 300 MG capsule Take 2 capsules by mouth 3 times a day   glucose blood (ACCU-CHEK AVIVA PLUS) test strip Test sugars daily   guaiFENesin (MUCINEX) 600 MG 12 hr tablet Take 1 tablet (600 mg total) by mouth 2 (two) times daily.   lisinopril (ZESTRIL) 10 MG tablet Take 1 tablet by mouth every day   methocarbamol (ROBAXIN) 500 MG tablet Take 1 tablet by mouth 3 times a day as needed for muscle spasms   Multiple Vitamin (MULTIVITAMIN WITH MINERALS) TABS tablet Take 1 tablet by mouth daily.   nitroGLYCERIN (NITROSTAT) 0.4 MG SL tablet Place 1 tablet (0.4 mg total) under the tongue every 5 (five) minutes as needed for chest pain.   nystatin (MYCOSTATIN/NYSTOP) powder Apply 1 application. topically 3 (three) times daily.   pantoprazole (PROTONIX) 40 MG tablet Take 1 tablet by mouth twice daily   potassium chloride SA (K-DUR)  20 MEQ tablet Take 20 mEq by mouth daily as needed (with lasix).   pramipexole (MIRAPEX) 0.5 MG tablet Take 1 tablet by mouth at bedtime   sertraline (ZOLOFT) 100 MG tablet Take 2 tablets (200 mg total) by mouth daily.   atorvastatin (LIPITOR) 40 MG tablet Take 1 tablet (40 mg total) by mouth daily.   No facility-administered encounter medications on file as of 03/24/2022.    Allergies (verified) Farxiga [dapagliflozin], Penicillins, and Sulfa antibiotics   History: Past Medical History:  Diagnosis Date   Anxiety    Asthma    Chest pain 12/22/2015   CHF (congestive heart failure) (HCC)    COPD (chronic obstructive pulmonary disease) (HCC)     Depression    Headache    HTN (hypertension)    Hypokalemia 07/28/2013   Kidney stones    Osteopenia 01/22/2020   Parkinson's disease    Pneumonia    Pre-diabetes    S/P colonoscopy 10/15/04   normal   Vitamin D deficiency 11/24/2018   Past Surgical History:  Procedure Laterality Date   CHOLECYSTECTOMY     COLONOSCOPY  10/15/2004   Normal rectum/Normal colon   COLONOSCOPY N/A 05/10/2016   Procedure: COLONOSCOPY;  Surgeon: West BaliSandi L Fields, MD;  Location: [facility];  Service: Endoscopy;  Laterality: N/A;  10:30   ESOPHAGEAL MANOMETRY N/A 04/02/2019   Procedure: ESOPHAGEAL MANOMETRY (EM);  Surgeon: Napoleon FormNandigam, Kavitha V, MD;  Location: [facility];  Service: Endoscopy;  Laterality: N/A;   ESOPHAGOGASTRODUODENOSCOPY  09/11/2010   Dysphagia likely multifactorial (possible Candida esophagitis, likely nonspecific esophageal motility disorder, and/or uncontrolled gastroesophageal reflux disease), status post empiric dilation   ESOPHAGOGASTRODUODENOSCOPY N/A 11/13/2018   Dr. Darrick PennaFields: No endoscopic esophageal abnormality to explain patient's dysphagia.  Esophagus dilated.  Moderate erosive/nodular gastritis with benign biopsies.   KIDNEY STONE SURGERY     SAVORY DILATION N/A 11/13/2018   Procedure: SAVORY DILATION;  Surgeon: West BaliFields, Sandi L, MD;  Location: [facility];  Service: Endoscopy;  Laterality: N/A;   Family History  Problem Relation Age of Onset   Coronary artery disease Father    COPD Mother    Asthma Brother    Coronary artery disease Sister    Diabetes Sister    Coronary artery disease Brother    Coronary artery disease Sister    Social History   Socioeconomic History   Marital status: Widowed    Spouse name: Not on file   Number of children: 2   Years of education: Not on file   Highest education level: Not on file  Occupational History   Occupation: disabled  Tobacco Use   Smoking status: Former    Packs/day: 0.25    Years: 30.00    Total pack years: 7.50     Types: Cigarettes    Start date: 01/04/1965    Quit date: 01/03/1995    Years since quitting: 27.2   Smokeless tobacco: Never  Vaping Use   Vaping Use: Never used  Substance and Sexual Activity   Alcohol use: No    Alcohol/week: 0.0 standard drinks of alcohol   Drug use: No   Sexual activity: Not on file  Other Topics Concern   Not on file  Social History Narrative   Lives alone. No stairs. Has ramps. Handicap bathroom   Social Determinants of Health   Financial Resource Strain: Low Risk  (03/24/2022)   Overall Financial Resource Strain (CARDIA)    Difficulty of Paying Living Expenses: Not hard at all  Recent  Concern: Financial Resource Strain - High Risk (02/24/2022)   Overall Financial Resource Strain (CARDIA)    Difficulty of Paying Living Expenses: Very hard  Food Insecurity: No Food Insecurity (03/24/2022)   Hunger Vital Sign    Worried About Running Out of Food in the Last Year: Never true    Ran Out of Food in the Last Year: Never true  Recent Concern: Food Insecurity - Food Insecurity Present (02/24/2022)   Hunger Vital Sign    Worried About Running Out of Food in the Last Year: Often true    Ran Out of Food in the Last Year: Often true  Transportation Needs: No Transportation Needs (03/24/2022)   PRAPARE - Administrator, Civil Service (Medical): No    Lack of Transportation (Non-Medical): No  Physical Activity: Insufficiently Active (03/24/2022)   Exercise Vital Sign    Days of Exercise per Week: 3 days    Minutes of Exercise per Session: 30 min  Stress: No Stress Concern Present (03/24/2022)   [employer] of Occupational Health - Occupational Stress Questionnaire    Feeling of Stress : Not at all  Social Connections: Socially Isolated (03/24/2022)   Social Connection and Isolation Panel [NHANES]    Frequency of Communication with Friends and Family: More than three times a week    Frequency of Social Gatherings with Friends and Family: More than  three times a week    Attends Religious Services: Never    Database administrator or Organizations: No    Attends Banker Meetings: Never    Marital Status: Widowed    Tobacco Counseling Counseling given: Not Answered   Clinical Intake:  Pre-visit preparation completed: Yes  Pain : No/denies pain     Nutritional Risks: None Diabetes: No  How often do you need to have someone help you when you read instructions, pamphlets, or other written materials from your doctor or pharmacy?: 1 - Never  Diabetic?no   Interpreter Needed?: No  Information entered by :: Renie Ora, LPN   Activities of Daily Living    03/24/2022   11:18 AM 02/18/2022    3:36 PM  In your present state of health, do you have any difficulty performing the following activities:  Hearing? 0 0  Vision? 0 0  Difficulty concentrating or making decisions? 0 0  Walking or climbing stairs? 0 1  Comment  takes extra time  Dressing or bathing? 0 1  Comment  has shower chair  Doing errands, shopping? 0 0  Preparing Food and eating ? N N  Using the Toilet? N N  In the past six months, have you accidently leaked urine? N Y  Comment  wears pads  Do you have problems with loss of bowel control? N N  Managing your Medications? N N  Managing your Finances? N N  Housekeeping or managing your Housekeeping? N Y  Comment  family assists (son)    Patient Care Team: Gabriel Earing, FNP as PCP - General (Family Medicine) Wyline Mood Dorothe Pea, MD as PCP - Cardiology (Cardiology) West Bali, MD (Inactive) (Gastroenterology) Beryle Beams, MD as Consulting Physician (Neurology) Wyline Mood Dorothe Pea, MD as Consulting Physician (Cardiology) Audrie Gallus, RN as [facility] Care Management Saporito, Fanny Dance, LCSW as Social Worker (Licensed Clinical Social Worker) Saporito, Fanny Dance, LCSW as Social Worker  Indicate any recent Medical Services you may have received from other than  [facility] providers in the past year (date may be  approximate).     Assessment:   This is a routine wellness examination for [facility].  Hearing/Vision screen Vision Screening - Comments:: Wears rx glasses - up to date with routine eye exams with  Dr.Johnson   Dietary issues and exercise activities discussed: Current Exercise Habits: Home exercise routine, Type of exercise: walking, Time (Minutes): 30, Frequency (Times/Week): 3, Weekly Exercise (Minutes/Week): 90, Intensity: Mild, Exercise limited by: orthopedic condition(s)   Goals Addressed             This Visit's Progress    Exercise 3x per week (30 min per time)         Depression Screen    03/24/2022   11:17 AM 02/24/2022    2:19 PM 02/18/2022    3:29 PM 06/18/2021   10:17 AM 04/03/2021    2:30 PM 03/02/2021    4:10 PM 01/21/2021    3:38 PM  PHQ 2/9 Scores  PHQ - 2 Score 0 0 0 0  PHQ- 9 Score 0 Fall Risk    03/24/2022   11:15 AM 02/24/2022    2:18 PM 02/18/2022    3:35 PM 06/18/2021   10:17 AM 04/03/2021    2:29 PM  Fall Risk   Falls in the past year? 0 0 Number falls in past yr: 0  0 0 1  Injury with Fall? 0  0 0 0  Risk for fall due to : No Fall Risks      Follow up Falls prevention discussed   Falls prevention discussed Falls prevention discussed    FALL RISK PREVENTION PERTAINING TO THE HOME:  Any stairs in or around the home? Yes  If so, are there any without handrails? No  Home free of loose throw rugs in walkways, pet beds, electrical cords, etc? Yes  Adequate lighting in your home to reduce risk of falls? Yes   ASSISTIVE DEVICES UTILIZED TO PREVENT FALLS:  Life alert? No  Use of a cane, walker or w/c? Yes  Grab bars in the bathroom? No  Shower chair or bench in shower? No  Elevated toilet seat or a handicapped toilet? No       02/20/2018    4:39 PM 09/30/2016    3:03 PM  MMSE - Mini Mental State Exam  Orientation to time 3 4  Orientation to Place 5 4  Registration 3 3   Attention/ Calculation 4 5  Recall 3   Language- name 2 objects 2 2  Language- repeat 1 1  Language- follow 3 step command 3 3  Language- read & follow direction 1 1  Write a sentence 1 1  Copy design 1 1  Total score 27         03/24/2022   11:19 AM 02/28/2020    2:25 PM 02/22/2019    3:35 PM  6CIT Screen  What Year? 0 points 0 points 4 points  What month? 0 points 0 points 0 points  What time? 0 points 0 points 0 points  Count back from 20 0 points 2 points 0 points  Months in reverse 0 points 0 points 2 points  Repeat phrase 0 points 0 points 0 points  Total Score 0 points 2 points 6 points    Immunizations Immunization History  Administered Date(s) Administered   Fluad Quad(high Dose 65+) 01/22/2020, 01/21/2021, 02/24/2022   Influenza, High Dose Seasonal PF 02/02/2015, 02/09/2016, 01/13/2017, 01/07/2018, 12/18/2018  Influenza-Unspecified 12/18/2018   Moderna Covid-19 Vaccine Bivalent Booster 55yrs & up 03/10/2021   PFIZER Comirnaty(Gray Top)Covid-19 Tri-Sucrose Vaccine 06/18/2020   PFIZER(Purple Top)SARS-COV-2 Vaccination 09/29/2019, 10/29/2019   Pneumococcal Conjugate-13 01/05/2015   Pneumococcal Polysaccharide-23 02/09/2016   Zoster, Live 01/05/2015    TDAP status: Due, Education has been provided regarding the importance of this vaccine. Advised may receive this vaccine at local pharmacy or Health Dept. Aware to provide a copy of the vaccination record if obtained from local pharmacy or Health Dept. Verbalized acceptance and understanding.  Flu Vaccine status: Up to date  Pneumococcal vaccine status: Up to date  Covid-19 vaccine status: Completed vaccines  Qualifies for Shingles Vaccine? Yes   Zostavax completed No   Shingrix Completed?: No.    Education has been provided regarding the importance of this vaccine. Patient has been advised to call insurance company to determine out of pocket expense if they have not yet received this vaccine. Advised may also  receive vaccine at local pharmacy or Health Dept. Verbalized acceptance and understanding.  Screening Tests Health Maintenance  Topic Date Due   COVID-19 Vaccine (5 - 2023-24 season) 03/13/2023 (Originally 12/25/2021)   Zoster Vaccines- Shingrix (1 of 2) 05/28/2023 (Originally 01/05/2000)   MAMMOGRAM  12/01/2022   Medicare Annual Wellness (AWV)  03/25/2023   DEXA SCAN  03/22/2024   COLONOSCOPY (Pts 45-62yrs Insurance coverage will need to be confirmed)  05/10/2026   Pneumonia Vaccine 39+ Years old  Completed   INFLUENZA VACCINE  Completed   Hepatitis C Screening  Completed   HPV VACCINES  Aged Out    Health Maintenance  There are no preventive care reminders to display for this patient.   Colorectal cancer screening: Type of screening: Colonoscopy. Completed 05/10/2016. Repeat every 10 years  Mammogram status: Completed 11/30/2021. Repeat every year  Bone Density status: Completed 03/22/2022. Results reflect: Bone density results: OSTEOPENIA. Repeat every 5 years.  Lung Cancer Screening: (Low Dose CT Chest recommended if Age 18-80 years, 30 pack-year currently smoking OR have quit w/in 15years.) does not qualify.   Lung Cancer Screening Referral: n/a  Additional Screening:  Hepatitis C Screening: does not qualify;   Vision Screening: Recommended annual ophthalmology exams for early detection of glaucoma and other disorders of the eye. Is the patient up to date with their annual eye exam?  Yes  Who is the provider or what is the name of the office in which the patient attends annual eye exams? Dr.Johnson  If pt is not established with a provider, would they like to be referred to a provider to establish care? No .   Dental Screening: Recommended annual dental exams for proper oral hygiene  Community Resource Referral / Chronic Care Management: CRR required this visit?  No   CCM required this visit?  No      Plan:     I have personally reviewed and noted the following  in the patient's chart:   Medical and social history Use of alcohol, tobacco or illicit drugs  Current medications and supplements including opioid prescriptions. Patient is not currently taking opioid prescriptions. Functional ability and status Nutritional status Physical activity Advanced directives List of other physicians Hospitalizations, surgeries, and ER visits in previous 12 months Vitals Screenings to include cognitive, depression, and falls Referrals and appointments  In addition, I have reviewed and discussed with patient certain preventive protocols, quality metrics, and best practice recommendations. A written personalized care plan for preventive services as well as general preventive health recommendations were  provided to patient.     Lorrene Reid, LPN   [id number]   Nurse Notes: Due TDAP Vaccine

## 2022-03-25 ENCOUNTER — Telehealth: Payer: Self-pay | Admitting: *Deleted

## 2022-03-25 ENCOUNTER — Telehealth: Payer: Medicare Other

## 2022-03-25 NOTE — Telephone Encounter (Signed)
   CCM RN Visit Note   03/25/22 Name: Allison Rollins MRN: [id number]      DOB: Jun 05, 1950  Subjective: Allison Rollins is a 71 y.o. year old female who is a primary care patient of Harlow Mares FNP. The patient was referred to the Chronic Care Management team for assistance with care management needs subsequent to provider initiation of CCM services and plan of care.      An unsuccessful telephone outreach was attempted today to contact the patient about Chronic Care Management needs.    Plan:Telephone follow up appointment with care management team member scheduled for:  upon care guide rescheduling.  Irving Shows RNC, BSN RN Case Manager [facility] [phone]

## 2022-03-30 ENCOUNTER — Telehealth: Payer: Self-pay | Admitting: *Deleted

## 2022-03-30 NOTE — Progress Notes (Signed)
  Care Coordination Note  03/30/2022 Name: Allison Rollins MRN: [id number] DOB: [birth year]  Allison Rollins is a 71 y.o. year old female who is a primary care patient of Gabriel Earing, FNP and is actively engaged with the care management team. I reached out to Allison Rollins by phone today to assist with re-scheduling an initial visit with the Licensed Clinical Social Worker  Follow up plan: Unsuccessful telephone outreach attempt made. A HIPAA compliant phone message was left for the patient providing contact information and requesting a return call.   [facility]  Care Coordination Care Guide  Direct Dial: [phone]

## 2022-04-06 NOTE — Progress Notes (Signed)
  Care Coordination Note  04/06/2022 Name: Allison Rollins MRN: [id number] DOB: 02-03-1951  Allison Rollins is a 71 y.o. year old female who is a primary care patient of Gabriel Earing, FNP and is actively engaged with the care management team. I reached out to Allison Rollins by phone today to assist with re-scheduling an initial visit with the Licensed Clinical Social Worker  Follow up plan: Unsuccessful telephone outreach attempt made. A HIPAA compliant phone message was left for the patient providing contact information and requesting a return call.   [facility]  Care Coordination Care Guide  Direct Dial: [phone]

## 2022-04-09 NOTE — Progress Notes (Signed)
  Care Coordination Note  04/09/2022 Name: [name] MRN: [id number] DOB: 10-17-1950  Allison Rollins is a 71 y.o. year old female who is a primary care patient of Gabriel Earing, FNP and is actively engaged with the care management team. I reached out to Allison Rollins by phone today to assist with re-scheduling an initial visit with the Licensed Clinical Social Worker  Follow up plan: Unsuccessful telephone outreach attempt made. We have been unable to make contact with the patient for follow up. The care management team is available to follow up with the patient after provider conversation with the patient regarding recommendation for care management engagement and subsequent re-referral to the care management team.   [facility] Coordination Care Guide  Direct Dial: [phone]

## 2022-04-10 ENCOUNTER — Other Ambulatory Visit: Payer: Self-pay | Admitting: Family Medicine

## 2022-04-10 DIAGNOSIS — K2100 Gastro-esophageal reflux disease with esophagitis, without bleeding: Secondary | ICD-10-CM

## 2022-04-10 DIAGNOSIS — I10 Essential (primary) hypertension: Secondary | ICD-10-CM

## 2022-04-15 ENCOUNTER — Ambulatory Visit: Payer: Medicare Other | Admitting: *Deleted

## 2022-04-15 ENCOUNTER — Telehealth: Payer: Self-pay | Admitting: *Deleted

## 2022-04-15 ENCOUNTER — Ambulatory Visit (INDEPENDENT_AMBULATORY_CARE_PROVIDER_SITE_OTHER): Payer: Medicare Other | Admitting: *Deleted

## 2022-04-15 ENCOUNTER — Ambulatory Visit: Payer: Self-pay | Admitting: *Deleted

## 2022-04-15 DIAGNOSIS — N1831 Chronic kidney disease, stage 3a: Secondary | ICD-10-CM

## 2022-04-15 DIAGNOSIS — I10 Essential (primary) hypertension: Secondary | ICD-10-CM

## 2022-04-15 DIAGNOSIS — I5032 Chronic diastolic (congestive) heart failure: Secondary | ICD-10-CM

## 2022-04-15 DIAGNOSIS — I509 Heart failure, unspecified: Secondary | ICD-10-CM

## 2022-04-15 NOTE — Progress Notes (Signed)
  Care Coordination Note  04/15/2022 Name: [name] MRN: [id number] DOB: [birth year]  Allison Rollins is a 71 y.o. year old female who is a primary care patient of Gabriel Earing, FNP and is actively engaged with the care management team. I reached out to Allison Rollins by phone today to assist with scheduling an initial visit with the Licensed Clinical Social Worker  Follow up plan: Telephone appointment with care management team member scheduled for: 04/22/2022  also called pt and gave food bank and adult services contact info for urgent help with food and gas money. Pt will call back if unable to obtain enough food for the upcoming weekend    Burman Nieves, [facility] Care Coordination Care Guide Direct Dial: [phone]

## 2022-04-15 NOTE — Patient Instructions (Signed)
Please call the care guide team at [phone] if you need to cancel or reschedule your appointment.   If you are experiencing a Mental Health or [place] or need someone to talk to, please call the Suicide and Crisis Lifeline: 988 call the [place] National Suicide Prevention Lifeline: (747) 729-0702 or TTY: 402-560-4715 TTY (986)366-9027) to talk to a trained counselor call 1-800-273-TALK (toll free, 24 hour hotline) go to [facility] [address], [place] [phone]) call the [facility]: [phone] call 911   Following is a copy of the CCM Program Consent:  CCM service includes personalized support from designated clinical staff supervised by the physician, including individualized plan of care and coordination with other care providers 24/7 contact phone numbers for assistance for urgent and routine care needs. Service will only be billed when office clinical staff spend 20 minutes or more in a month to coordinate care. Only one practitioner may furnish and bill the service in a calendar month. The patient may stop CCM services at amy time (effective at the end of the month) by phone call to the office staff. The patient will be responsible for cost sharing (co-pay) or up to 20% of the service fee (after annual deductible is met)  Following is a copy of your full provider care plan:   Goals Addressed             This Visit's Progress    CCM (CHRONIC KIDNEY DISEASE) EXPECTED OUTCOME: MONITOR, SELF-MANAGE AND REDUCE SYMPTOMS OF CHRONIC KIDNEY DISEASE       Current Barriers:  Knowledge Deficits related to Chronic Kidney Disease Chronic Disease Management support and education needs related to management of Chronic Kidney Disease No Advanced Directives in place- pt declines Patient reports she tries to eat as healthy as she can, does do some light exercise, is now checking blood pressure daily and weighing  once per week Patient reports she has 3 days of Jardiance left and these were samples provided by primary care provider office, pt states she is unsure what to do when she runs out, pt states she is unable to afford. RN care manager called patient back to inform her pharmacist at provider office left samples for pt at front desk, pt verbalizes understanding  Planned Interventions: Evaluation of current treatment plan related to chronic kidney disease self management and patient's adherence to plan as established by provider      Provided education to patient re: stroke prevention, s/s of heart attack and stroke    Reviewed medications with patient and discussed importance of compliance    Counseled on the importance of exercise goals with target of 150 minutes per week     Advised patient, providing education and rationale, to monitor blood pressure daily and record, calling PCP for findings outside established parameters    Discussed complications of poorly controlled blood pressure such as heart disease, stroke, circulatory complications, vision complications, kidney impairment, sexual dysfunction    Discussed plans with patient for ongoing care management follow up and provided patient with direct contact information for care management team    Screening for signs and symptoms of depression related to chronic disease state      Reinforced importance of taking all medications as prescribed Informed pt that samples of jardiance are at front desk at primary care provider office for her to pick up, reviewed pharmacist wants pt to try and fill jardiance in January if she can afford due to donut  hole resets and copay will be cheaper, can take up to 6 weeks to get approved for med assistance and office may not always have samples on hand Encouraged pt to call into primary care provider office if she is low on any medication and cannot afford refill  Symptom Management: Take medications as prescribed    Attend all scheduled provider appointments Call pharmacy for medication refills 3-7 days in advance of running out of medications Attend church or other social activities Perform all self care activities independently  Perform IADL's (shopping, preparing meals, housekeeping, managing finances) independently Call provider office for new concerns or questions  It is important to keep your blood pressure under good control Choose water as your main beverage Try to walk some everyday You have scheduled telephone call with pharmacist on 05/07/22 at 1 pm Please pickup samples of jardiance at front desk at primary care provider office Try to refill jardiance in January if you can afford, the donut hole resets and copay will be cheaper  Follow Up Plan: Telephone follow up appointment with care management team member scheduled for:  06/15/22 at 1045 am          The patient verbalized understanding of instructions, educational materials, and care plan provided today and DECLINED offer to receive copy of patient instructions, educational materials, and care plan.   Telephone follow up appointment with care management team member scheduled for:  06/15/22 at 1045 am
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
## 2022-04-15 NOTE — Chronic Care Management (AMB) (Signed)
Chronic Care Management   CCM RN Visit Note  04/15/2022 Name: Allison Rollins MRN: [id number] DOB: Oct 08, 1950  Subjective: Allison Rollins is a 71 y.o. year old female who is a primary care patient of Gabriel Earing, FNP. The patient was referred to the Chronic Care Management team for assistance with care management needs subsequent to provider initiation of CCM services and plan of care.    Today's Visit:  Engaged with patient by telephone for follow up visit.        Goals Addressed             This Visit's Progress    CCM (CHRONIC KIDNEY DISEASE) EXPECTED OUTCOME: MONITOR, SELF-MANAGE AND REDUCE SYMPTOMS OF CHRONIC KIDNEY DISEASE       Current Barriers:  Knowledge Deficits related to Chronic Kidney Disease Chronic Disease Management support and education needs related to management of Chronic Kidney Disease No Advanced Directives in place- pt declines Patient reports she tries to eat as healthy as she can, does do some light exercise, is now checking blood pressure daily and weighing once per week Patient reports she has 3 days of Jardiance left and these were samples provided by primary care provider office, pt states she is unsure what to do when she runs out, pt states she is unable to afford. RN care manager called patient back to inform her pharmacist at provider office left samples for pt at front desk, pt verbalizes understanding  Planned Interventions: Evaluation of current treatment plan related to chronic kidney disease self management and patient's adherence to plan as established by provider      Provided education to patient re: stroke prevention, s/s of heart attack and stroke    Reviewed medications with patient and discussed importance of compliance    Counseled on the importance of exercise goals with target of 150 minutes per week     Advised patient, providing education and rationale, to monitor blood pressure daily and record, calling PCP for findings outside  established parameters    Discussed complications of poorly controlled blood pressure such as heart disease, stroke, circulatory complications, vision complications, kidney impairment, sexual dysfunction    Discussed plans with patient for ongoing care management follow up and provided patient with direct contact information for care management team    Screening for signs and symptoms of depression related to chronic disease state      Reinforced importance of taking all medications as prescribed Informed pt that samples of jardiance are at front desk at primary care provider office for her to pick up, reviewed pharmacist wants pt to try and fill jardiance in January if she can afford due to donut hole resets and copay will be cheaper, can take up to 6 weeks to get approved for med assistance and office may not always have samples on hand Encouraged pt to call into primary care provider office if she is low on any medication and cannot afford refill  Symptom Management: Take medications as prescribed   Attend all scheduled provider appointments Call pharmacy for medication refills 3-7 days in advance of running out of medications Attend church or other social activities Perform all self care activities independently  Perform IADL's (shopping, preparing meals, housekeeping, managing finances) independently Call provider office for new concerns or questions  It is important to keep your blood pressure under good control Choose water as your main beverage Try to walk some everyday You have scheduled telephone call with pharmacist on 05/07/22 at 1  pm Please pickup samples of jardiance at front desk at primary care provider office Try to refill jardiance in January if you can afford, the donut hole resets and copay will be cheaper  Follow Up Plan: Telephone follow up appointment with care management team member scheduled for:  06/15/22 at 1045 am          Plan:Telephone follow up appointment  with care management team member scheduled for:  06/15/22 at 1045 am  Jacqlyn Larsen [facility], BSN RN Case Manager [place] [phone]

## 2022-04-15 NOTE — Plan of Care (Signed)
Chronic Care Management Provider Comprehensive Care Plan    04/15/2022 Name: Allison Rollins MRN: [id number] DOB: 03-Sep-1950  Referral to Chronic Care Management (CCM) services was placed by Provider:  Harlow Mares FNP on Date: 02/03/22.  Chronic Condition 1: HYPERTENSION Provider Assessment and Plan  Essential hypertension       Well controlled on current regimen. Managed by cardiology.        Relevant Medications    furosemide (LASIX) 20 MG tablet     Expected Outcome/Goals Addressed This Visit (Provider CCM goals/Provider Assessment and plan  CCM (HYPERTENSION) EXPECTED OUTCOME: MONITOR, SELF-MANAGE AND REDUCE SYMPTOMS OF HYPERTENSION  Symptom Management Condition 1: Take medications as prescribed   Attend all scheduled provider appointments Call pharmacy for medication refills 3-7 days in advance of running out of medications Attend church or other social activities Perform all self care activities independently  Perform IADL's (shopping, preparing meals, housekeeping, managing finances) independently Call provider office for new concerns or questions  check blood pressure 3 times per week choose a place to take my blood pressure (home, clinic or office, retail store) write blood pressure results in a log or diary keep a blood pressure log take blood pressure log to all doctor appointments take medications for blood pressure exactly as prescribed report new symptoms to your doctor eat more whole grains, fruits and vegetables, lean meats and healthy fats Follow low sodium diet Call [employer] for information on getting a blood pressure cuff from catalog Care guide will be contacting you for help with obtaining food Message was sent to see if social worker can call you today  Chronic Condition 2: CONGESTIVE HEART FAILURE Provider Assessment and Plan Chronic diastolic heart failure (HCC) (Chronic)       Well controlled on current regimen. Managed by cardiology.         Relevant Medications    empagliflozin (JARDIANCE) 10 MG TABS tablet    furosemide (LASIX) 20 MG tablet      Expected Outcome/Goals Addressed This Visit (Provider CCM goals/Provider Assessment and plan  CCM (CONGESTIVE HEART FAILURE) EXPECTED OUTCOME: MONITOR, SELF-MANAGE AND REDUCE SYMPTOMS OF CONGESTIVE HEART FAILURE  Symptom Management Condition 2: Take medications as prescribed   Attend all scheduled provider appointments Call pharmacy for medication refills 3-7 days in advance of running out of medications Attend church or other social activities Perform all self care activities independently  Perform IADL's (shopping, preparing meals, housekeeping, managing finances) independently Call provider office for new concerns or questions  Work with the social worker to address care coordination needs and will continue to work with the clinical team to address health care and disease management related needs do ankle pumps when sitting keep legs up while sitting use salt in moderation watch for swelling in feet, ankles and legs every day weigh myself daily develop a rescue plan follow rescue plan if symptoms flare-up eat more whole grains, fruits and vegetables, lean meats and healthy fats track symptoms and what helps feel better or worse dress right for the weather, hot or cold Follow heart failure action plan- call doctor early on for change in health status, symptoms Continue weighing- please consider weighing daily Get outside and walk if you are able  Chronic Condition 1: CHRONIC KIDNEY DISEASE Provider Assessment and Plan  Stage 3a chronic kidney disease (HCC) - Primary       Continue current regimen.        Relevant Medications    empagliflozin (JARDIANCE) 10 MG TABS tablet  Expected Outcome/Goals Addressed This Visit (Provider CCM goals/Provider Assessment and plan  CCM (CHRONIC KIDNEY DISEASE) EXPECTED OUTCOME: MONITOR, SELF-MANAGE AND REDUCE SYMPTOMS OF  CHRONIC KIDNEY DISEASE  Symptom Management Condition 3: Take medications as prescribed   Attend all scheduled provider appointments Call pharmacy for medication refills 3-7 days in advance of running out of medications Attend church or other social activities Perform all self care activities independently  Perform IADL's (shopping, preparing meals, housekeeping, managing finances) independently Call provider office for new concerns or questions  It is important to keep your blood pressure under good control Choose water as your main beverage Try to walk some everyday You have scheduled telephone call with pharmacist on 05/07/22 at 1 pm Pharmacist was made aware you are almost out of jardiance  Problem List Patient Active Problem List   Diagnosis Date Noted   Morbid obesity (HCC) 02/24/2022   Gastroesophageal reflux disease with esophagitis 02/24/2022   Dyslipidemia 09/05/2021   Parkinson's disease 09/05/2021   Chronic obstructive pulmonary disease (HCC) 09/05/2021   Edema 01/21/2021   Stage 3a chronic kidney disease (HCC) 06/17/2020   Osteopenia 01/22/2020   Neuropathy 10/21/2019   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   Achalasia of esophagus    Restless leg 11/24/2018   Erosive gastritis    Seasonal allergic rhinitis due to pollen 07/20/2018   Recurrent depression (HCC) 12/26/2015   Generalized anxiety disorder 03/12/2015   Prediabetes 03/12/2015   Back pain 03/12/2015   Stress incontinence 01/06/2015   Chronic diastolic heart failure (HCC) 01/03/2015   Obesity (BMI 30-39.9) 07/01/2011   Essential hypertension 07/01/2011   Mixed hyperlipidemia 07/01/2011   GERD (gastroesophageal reflux disease) 09/08/2010    Medication Management  Current Outpatient Medications:    acetaminophen (TYLENOL) 500 MG tablet, Take 1 tablet (500 mg total) by mouth every 6 (six) hours as needed., Disp: 30 tablet, Rfl: 0   albuterol (PROVENTIL) (2.5 MG/3ML) 0.083% nebulizer solution, Take 3 mLs  (2.5 mg total) by nebulization every 6 (six) hours as needed for wheezing or shortness of breath., Disp: 150 mL, Rfl: 1   albuterol (VENTOLIN HFA) 108 (90 Base) MCG/ACT inhaler, Inhale 2 puffs into the lungs every 6 (six) hours as needed for wheezing or shortness of breath., Disp: 1 each, Rfl: 0   Alcohol Swabs (B-D SINGLE USE SWABS REGULAR) PADS, Test sugars daily, Disp: 100 each, Rfl: 3   ALLERGY RELIEF CETIRIZINE 10 MG tablet, Take 1 tablet by mouth every day, Disp: 30 tablet, Rfl: 11   aspirin EC 81 MG tablet, Take 1 tablet (81 mg total) by mouth daily., Disp: , Rfl:    atorvastatin (LIPITOR) 40 MG tablet, Take 1 tablet (40 mg total) by mouth daily., Disp: 90 tablet, Rfl: 1   cholecalciferol (VITAMIN D3) 25 MCG (1000 UT) tablet, Take 1,000 Units by mouth 2 (two) times daily. , Disp: , Rfl:    empagliflozin (JARDIANCE) 10 MG TABS tablet, Take 1 tablet (10 mg total) by mouth at bedtime., Disp: 90 tablet, Rfl: 1   famotidine (PEPCID) 20 MG tablet, Take 1 tablet (20 mg total) by mouth at bedtime., Disp: 90 tablet, Rfl: 1   fluticasone (FLONASE) 50 MCG/ACT nasal spray, Use 2 sprays into each nostril every day, Disp: 16 g, Rfl: 5   furosemide (LASIX) 20 MG tablet, Take 1 tablet (20 mg total) by mouth daily as needed., Disp: 90 tablet, Rfl: 1   gabapentin (NEURONTIN) 300 MG capsule, Take 2 capsules by mouth 3 times a day, Disp: 180 capsule, Rfl:  2   glucose blood (ACCU-CHEK AVIVA PLUS) test strip, Test sugars daily, Disp: 100 each, Rfl: 3   lisinopril (ZESTRIL) 10 MG tablet, Take 1 tablet by mouth every day, Disp: 30 tablet, Rfl: 4   methocarbamol (ROBAXIN) 500 MG tablet, Take 1 tablet by mouth 3 times a day as needed for muscle spasms, Disp: 30 tablet, Rfl: 11   Multiple Vitamin (MULTIVITAMIN WITH MINERALS) TABS tablet, Take 1 tablet by mouth daily., Disp: , Rfl:    nitroGLYCERIN (NITROSTAT) 0.4 MG SL tablet, Place 1 tablet (0.4 mg total) under the tongue every 5 (five) minutes as needed for chest  pain., Disp: 20 tablet, Rfl: 3   nystatin (MYCOSTATIN/NYSTOP) powder, Apply 1 application. topically 3 (three) times daily., Disp: 60 g, Rfl: 1   pantoprazole (PROTONIX) 40 MG tablet, Take 1 tablet by mouth twice daily, Disp: 60 tablet, Rfl: 4   potassium chloride SA (K-DUR) 20 MEQ tablet, Take 20 mEq by mouth daily as needed (with lasix)., Disp: , Rfl:    pramipexole (MIRAPEX) 0.5 MG tablet, Take 1 tablet by mouth at bedtime, Disp: 30 tablet, Rfl: 2   sertraline (ZOLOFT) 100 MG tablet, Take 2 tablets (200 mg total) by mouth daily., Disp: 180 tablet, Rfl: 3   guaiFENesin (MUCINEX) 600 MG 12 hr tablet, Take 1 tablet (600 mg total) by mouth 2 (two) times daily. (Patient not taking: Reported on 04/15/2022), Disp: 30 tablet, Rfl: 0  Cognitive Assessment Identity Confirmed: : Name; DOB Cognitive Status: Normal   Functional Assessment Hearing Difficulty or Deaf: no Wear Glasses or Blind: yes Vision Management: can see well w/ glasses Concentrating, Remembering or Making Decisions Difficulty (CP): no Difficulty Communicating: no Difficulty Eating/Swallowing: no   Caregiver Assessment  Primary Source of Support/Comfort: child(ren) Name of Support/Comfort Primary Source: adult son and other relatives People in Home: alone   Planned Interventions  Evaluation of current treatment plan related to hypertension self management and patient's adherence to plan as established by provider;   Reviewed prescribed diet low sodium Reviewed medications with patient and discussed importance of compliance;  Counseled on the importance of exercise goals with target of 150 minutes per week Discussed plans with patient for ongoing care management follow up and provided patient with direct contact information for care management team; Advised patient, providing education and rationale, to monitor blood pressure daily and record, calling PCP for findings outside established parameters;  Provided education on  prescribed diet low sodium;  Discussed complications of poorly controlled blood pressure such as heart disease, stroke, circulatory complications, vision complications, kidney impairment, sexual dysfunction;  Screening for signs and symptoms of depression related to chronic disease state;  Assessed social determinant of health barriers;  Information given for [address] in [place] that has hot meals Wed- Sat and free groceries including fresh produce Encouraged pt to speak with her church for assistance Referral care guide for food resources (immediate need within the next week) In basket sent to [employer] with request from pt to call her today if she can, gave LCSW about patient's financial situation Provided education on low sodium diet Assessed need for readable accurate scales in home Provided education about placing scale on hard, flat surface Advised patient to weigh each morning after emptying bladder Discussed importance of daily weight and advised patient to weigh and record daily Discussed the importance of keeping all appointments with provider Provided patient with education about the role of exercise in the management of heart failure Referral made to community  resources care guide team for assistance with food insecurity Reinforced HF action plan, importance of calling doctor early on for change in health status, symptoms Evaluation of current treatment plan related to chronic kidney disease self management and patient's adherence to plan as established by provider      Provided education to patient re: stroke prevention, s/s of heart attack and stroke    Reviewed medications with patient and discussed importance of compliance    Counseled on the importance of exercise goals with target of 150 minutes per week     Advised patient, providing education and rationale, to monitor blood pressure daily and record, calling PCP for findings outside established parameters     Discussed complications of poorly controlled blood pressure such as heart disease, stroke, circulatory complications, vision complications, kidney impairment, sexual dysfunction    Discussed plans with patient for ongoing care management follow up and provided patient with direct contact information for care management team    Screening for signs and symptoms of depression related to chronic disease state      Reinforced importance of taking all medications as prescribed In basket message sent to [facility] pharmacist reporting pt will be out of Jardiance in 3 days and has been taking samples, ask if any further samples available  Interaction and coordination with outside resources, practitioners, and providers See CCM Referral  Care Plan: Patient declined
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
## 2022-04-16 ENCOUNTER — Encounter: Payer: Self-pay | Admitting: *Deleted

## 2022-04-16 NOTE — Patient Instructions (Signed)
Visit Information  Thank you for taking time to visit with me today. Please don't hesitate to contact me if I can be of assistance to you.   Following are the goals we discussed today:   Goals Addressed               This Visit's Progress     Find Help in My Community. (pt-stated)   On track     Care Coordination Interventions:  Assessed Social Determinant of Health Barriers. Discussed Plans with Patient for Ongoing Care Management Follow Up. Provided Patient with Direct Contact Information for Care Management Team. Screened Patient for Signs & Symptoms of Depression Related to Chronic Disease State.  OIZ1/IWP8 Depression Screen Completed & Results Reviewed with Patient.  Suicidal Ideation/Homicidal Ideation Assessed - None Present. Solution-Focused Strategies Employed. Active Listening/Reflection Utilized.  Verbalization of Feelings Encouraged.  Emotional Support Provided. Problem Solving Interventions Identified. Task-Centered Solutions Developed.   Psychoeducation for Mental Health Concerns Initiated. Reviewed Prescription Medications & Discussed Importance of Compliance. Quality of Sleep Assessed & Sleep Hygiene Techniques Promoted. Please Contact The Following List of [employer], In An Effort to Obtain Financial Assistance: ~ [employer] [phone]# [phone]) ~ [employer] 561-018-2639) ~ Lot 872-202-0961 (# 9783545067) ~ [facility] Department of Social Services 707-131-0901# (425)[phone]) ~ [employer] of [place] 5488149621) ~ [employer] [phone]# (321) 741-1486) ~ [employer] (# [phone]) ~ [facility] (# (319)656-5975) ~ [employer] (# 254-385-0142) ~ [employer] (817) 886-6923) ~ Aging, Disability & Transit Services of [place] [phone])        Our next appointment is by telephone on 04/21/2022 at 2:15 pm.  Please call the care guide team at [phone] if you need to cancel or reschedule  your appointment.   If you are experiencing a Mental Health or Behavioral Health Crisis or need someone to talk to, please call the Suicide and Crisis Lifeline: 988 call the [place] National Suicide Prevention Lifeline: (713)177-9378 or TTY: (403) 616-1020 TTY (760)693-6836) to talk to a trained counselor call 1-800-273-TALK (toll free, 24 hour hotline) go to [facility] [address] [place] [phone]) call the [facility] Crisis Line: [phone] call 911  Patient verbalizes understanding of instructions and care plan provided today and agrees to view in MyChart. Active MyChart status and patient understanding of how to access instructions and care plan via MyChart confirmed with patient.     Telephone follow up appointment with care management team member scheduled for:  04/21/2022 at 2:15 pm.  Danford Bad, BSW, MSW, LCSW  Licensed Clinical Social Worker  [facility]  Mailing [place]. [address] [place], [place] [address] Physical Address-[address] [place], [place] [address] Toll Free Main # [phone] Fax # [phone] Cell # [phone] [email or web address]

## 2022-04-16 NOTE — Patient Outreach (Signed)
  Care Coordination   Initial Visit Note   04/16/2022  Name: Allison Rollins MRN: [id number] DOB: 07-May-1950  Allison Rollins is a 71 y.o. year old female who sees Gabriel Earing, FNP for primary care. I spoke with Allison Rollins by phone today.  What matters to the patients health and wellness today?  Find Help in My Community.    Goals Addressed               This Visit's Progress     Find Help in My Community. (pt-stated)   On track     Care Coordination Interventions:  Assessed Social Determinant of Health Barriers. Discussed Plans with Patient for Ongoing Care Management Follow Up. Provided Patient with Direct Contact Information for Care Management Team. Screened Patient for Signs & Symptoms of Depression Related to Chronic Disease State.  AJO8/NOM7 Depression Screen Completed & Results Reviewed with Patient.  Suicidal Ideation/Homicidal Ideation Assessed - None Present. Solution-Focused Strategies Employed. Active Listening/Reflection Utilized.  Verbalization of Feelings Encouraged.  Emotional Support Provided. Problem Solving Interventions Identified. Task-Centered Solutions Developed.   Psychoeducation for Mental Health Concerns Initiated. Reviewed Prescription Medications & Discussed Importance of Compliance. Quality of Sleep Assessed & Sleep Hygiene Techniques Promoted. Please Contact The Following List of [employer], In An Effort to [employer] Assistance: ~ [employer] [phone]# [phone]) ~ [employer] [phone]) ~ Lot 717-878-1641 (# 902-698-4189) ~ [facility] Department of Social Services [phone]# (272)[phone]) ~ [employer] of [place] 661-400-1350) ~ [employer] 781-737-2587# (612)278-4100) ~ [employer] (# 3158086868) ~ [facility] (# (838)771-1015) ~ [employer] (# (414) 105-8468) ~ [employer] [employer] 5170749214) ~ Aging, Disability & Transit Services of [place] (234)887-4771)           SDOH assessments and interventions completed:  Yes.  SDOH Interventions Today    Flowsheet Row Most Recent Value  SDOH Interventions   Food Insecurity Interventions Intervention Not Indicated  Housing Interventions Intervention Not Indicated  Transportation Interventions Intervention Not Indicated, Patient Resources (Friends/Family), Payor Benefit  Utilities Interventions Intervention Not Indicated  Alcohol Usage Interventions Intervention Not Indicated (Score <7)  Financial Strain Interventions Other (Comment)  [Provided Financial Resources]  Physical Activity Interventions Intervention Not Indicated  Stress Interventions Intervention Not Indicated  Social Connections Interventions Intervention Not Indicated     Care Coordination Interventions:  Yes, provided.   Follow up plan: Follow up call scheduled for 04/21/2022 at 2:15 pm.  Encounter Outcome:  Pt. Visit Completed.   Danford Bad, BSW, MSW, LCSW  Licensed Restaurant manager, fast food [facility]  Mailing [place] [address] [place], [place] [address] Physical Address-[address], [place], [place] [address] Toll Free Main # [phone] Fax # [phone] Cell # [phone] [email or web address]

## 2022-04-21 ENCOUNTER — Encounter: Payer: Self-pay | Admitting: *Deleted

## 2022-04-21 ENCOUNTER — Ambulatory Visit: Payer: Self-pay | Admitting: *Deleted

## 2022-04-21 NOTE — Patient Outreach (Signed)
  Care Coordination   Follow Up Visit Note   04/21/2022  Name: Allison Rollins MRN: [id number] DOB: [birth year]  Allison Rollins is a 71 y.o. year old female who sees Gabriel Earing, FNP for primary care. I spoke with Allison Rollins by phone today.  What matters to the patients health and wellness today?  Find Help in My Community.    Goals Addressed               This Visit's Progress     Find Help in My Community. (pt-stated)   On track     Care Coordination Interventions:  Solution-Focused Strategies Activated. Active Listening/Reflection Utilized.  Verbalization of Feelings Encouraged.  Emotional Support Provided. Problem Solving Interventions Resolved. Task-Centered Solutions Employed.   Please Continue Contacting The Following List of [employer], In An Effort to [employer] Assistance: ~ [employer] [phone]# [phone]) ~ [employer] [phone]) ~ Lot 830-853-3347 (# 979-804-5915) ~ [facility] Department of Social Services [phone]# 623-612-4214) ~ [employer] of [place] [phone]) ~ [employer] 850-095-1586# [phone]) ~ [employer] (# 403-777-5854) ~ [facility] (# (248)528-0816) ~ [employer] (# 262-001-0352) ~ [employer] (832)187-9042) ~ Aging, Disability & Transit Services of [place] [phone]) Please Continue to Accept [employer] Contributions & Food Assistance through [employer] at [employer]. Please Pay All Outstanding Bills When Social Security Income Check is Deposited Into Your Account on 04/23/2022. [employer] Benefits Reviewed, to Ensure Understanding. Effective 04/26/2022, You Will Receive $180.00 Per Month Grocery Allowance, As A Benefit of [employer]etna Medicare.   Effective 04/26/2022, You Will Receive $105.00 Per Month Over-The-Counter Allowance, As A Benefit of [employer]na Medicare.        SDOH assessments and interventions completed:  Yes.  Care Coordination  Interventions:  Yes, provided.   Follow up plan: Follow up call scheduled for 05/05/2022 at 12:45 pm.  Encounter Outcome:  Pt. Visit Completed.   Danford Bad, BSW, MSW, LCSW  Licensed Restaurant manager, fast food [facility]  Mailing [place] [address] [place], [place] [address] Physical Address-[address] [place], [place] [address] Toll Free Main # [phone] Fax # [phone] Cell # [phone] [email or web address]

## 2022-04-21 NOTE — Patient Instructions (Signed)
Visit Information  Thank you for taking time to visit with me today. Please don't hesitate to contact me if I can be of assistance to you.   Following are the goals we discussed today:   Goals Addressed               This Visit's Progress     Find Help in My Community. (pt-stated)   On track     Care Coordination Interventions:  Solution-Focused Strategies Activated. Active Listening/Reflection Utilized.  Verbalization of Feelings Encouraged.  Emotional Support Provided. Problem Solving Interventions Resolved. Task-Centered Solutions Employed.   Please Continue Contacting The Following List of [employer], In An Effort to [employer] Assistance: ~ [employer] [phone]# [phone]) ~ [employer] (506)417-2752) ~ Lot 509-071-0001 (# (862)[phone]) ~ [facility] Department of Social Services [phone]# [phone]) ~ [employer] of [place] 763-693-1698) ~ [employer] [phone]# [phone]) ~ [employer] (# [phone]) ~ [facility] [place] (# 769-622-4855) ~ [employer] (# 408-611-1310) ~ [employer] [phone]) ~ Aging, Disability & Transit Services of [place] [phone]) Please Continue to Accept [employer] Contributions & Food Assistance through [employer] at [employer]. Please Pay All Outstanding Bills When Social Security Income Check is Deposited Into Your Account on 04/23/2022. [employer] Benefits Reviewed, to Ensure Understanding. Effective 04/26/2022, You Will Receive $180.00 Per Month Grocery Allowance, As A Benefit of Aetna Medicare.   Effective 04/26/2022, You Will Receive $105.00 Per Month Over-The-Counter Allowance, As A Benefit of Aetna Medicare.        Our next appointment is by telephone on 05/05/2022 at 12:45 pm.  Please call the care guide team at [phone] if you need to cancel or reschedule your appointment.   If you are experiencing a Mental Health or  Behavioral Health Crisis or need someone to talk to, please call the Suicide and Crisis Lifeline: 988 call the [place] National Suicide Prevention Lifeline: 9863202550 or TTY: 3528182329 TTY 613-261-1167) to talk to a trained counselor call 1-800-273-TALK (toll free, 24 hour hotline) go to [facility] [address] [place] [phone]) call the [facility] Crisis Line: [phone] call 911  Patient verbalizes understanding of instructions and care plan provided today and agrees to view in MyChart. Active MyChart status and patient understanding of how to access instructions and care plan via MyChart confirmed with patient.     Telephone follow up appointment with care management team member scheduled for:  05/05/2022 at 12:45 pm.    Danford Bad, BSW, MSW, LCSW  Licensed Clinical Social Worker  [facility]  Mailing [place]. [address], [place], [place] [address] Physical Address-[address], [place], [place] [address] Toll Free Main # [phone] Fax # [phone] Cell # [phone] [email or web address]

## 2022-04-23 ENCOUNTER — Encounter: Payer: Self-pay | Admitting: Cardiology

## 2022-04-23 ENCOUNTER — Ambulatory Visit: Payer: Medicare Other | Attending: Cardiology | Admitting: Cardiology

## 2022-04-23 VITALS — BP 126/76 | HR 63 | Ht 67.0 in | Wt 223.0 lb

## 2022-04-23 DIAGNOSIS — E782 Mixed hyperlipidemia: Secondary | ICD-10-CM | POA: Diagnosis not present

## 2022-04-23 DIAGNOSIS — I10 Essential (primary) hypertension: Secondary | ICD-10-CM

## 2022-04-23 DIAGNOSIS — I5032 Chronic diastolic (congestive) heart failure: Secondary | ICD-10-CM

## 2022-04-23 NOTE — Progress Notes (Signed)
Clinical Summary Allison Rollins is a 71 y.o.female seen today for follow up of the following medical problems.     1. Chronic diastolic heart failure  - no recent edema per her report - 07/2021 CT PE suggested some RV dysfunction  10/2021 echo: LVEF 50-55%, no WMAs, indet diastolif fnx, normal RV, mild to mod MR - has prn lasix, takes about 1-2 times per week. Controls swelling.    2. HTN - she is compliant with meds  3. COPD - compliant with inhalers      4. Hyperlipidemia - compliant with statin - 07/2021 TC 172 TG 143 HDL 47 LDL 100 - 02/2022 TC 158 TG 202 HDL 39 LDL 85   5. OSA screen  - she reports test was negative.    6.Fall/Presyncope Admit 08/2021 with fall, +UTI - ER visit 08/23/21 with preynsyncope and chest pain. CT PE was negative.  - no recurrent syncope. She standing at stove. Felt lightheaded and weakness, fell to floor. She denies LOC. Reports nausea and had not eaten 2 days leading up this presyncopal episode.   - no recurrent episode  7. Mitral regurgitation - 10/2021 echo mild to mod MR       SH: has 3 grandkids, 2 sons. Husband just passed this March. Works at [employer].  One grandson will be starting at [employer] in 2 years. Granddaughter Past Medical History:  Diagnosis Date   Anxiety    Asthma    Chest pain 12/22/2015   CHF (congestive heart failure) (HCC)    COPD (chronic obstructive pulmonary disease) (HCC)    Depression    Headache    HTN (hypertension)    Hypokalemia 07/28/2013   Kidney stones    Osteopenia 01/22/2020   Parkinson's disease    Pneumonia    Pre-diabetes    S/P colonoscopy 10/15/04   normal   Vitamin D deficiency 11/24/2018     Allergies  Allergen Reactions   Farxiga [Dapagliflozin] Swelling   Penicillins Itching and Rash    Has patient had a PCN reaction causing immediate rash, facial/tongue/throat swelling, SOB or lightheadedness with hypotension: no Has patient had a PCN reaction causing severe  rash involving mucus membranes or skin necrosis: no Has patient had a PCN reaction that required hospitalization: no Has patient had a PCN reaction occurring within the last 10 years: no If all of the above answers are "NO", then may proceed with Cephalosporin use.    Sulfa Antibiotics Rash     Current Outpatient Medications  Medication Sig Dispense Refill   acetaminophen (TYLENOL) 500 MG tablet Take 1 tablet (500 mg total) by mouth every 6 (six) hours as needed. 30 tablet 0   albuterol (PROVENTIL) (2.5 MG/3ML) 0.083% nebulizer solution Take 3 mLs (2.5 mg total) by nebulization every 6 (six) hours as needed for wheezing or shortness of breath. 150 mL 1   albuterol (VENTOLIN HFA) 108 (90 Base) MCG/ACT inhaler Inhale 2 puffs into the lungs every 6 (six) hours as needed for wheezing or shortness of breath. 1 each 0   Alcohol Swabs (B-D SINGLE USE SWABS REGULAR) PADS Test sugars daily 100 each 3   ALLERGY RELIEF CETIRIZINE 10 MG tablet Take 1 tablet by mouth every day 30 tablet 11   aspirin EC 81 MG tablet Take 1 tablet (81 mg total) by mouth daily.     atorvastatin (LIPITOR) 40 MG tablet Take 1 tablet (40 mg total) by mouth daily. 90 tablet 1  cholecalciferol (VITAMIN D3) 25 MCG (1000 UT) tablet Take 1,000 Units by mouth 2 (two) times daily.      empagliflozin (JARDIANCE) 10 MG TABS tablet Take 1 tablet (10 mg total) by mouth at bedtime. 90 tablet 1   famotidine (PEPCID) 20 MG tablet Take 1 tablet (20 mg total) by mouth at bedtime. 90 tablet 1   fluticasone (FLONASE) 50 MCG/ACT nasal spray Use 2 sprays into each nostril every day 16 g 5   furosemide (LASIX) 20 MG tablet Take 1 tablet (20 mg total) by mouth daily as needed. 90 tablet 1   gabapentin (NEURONTIN) 300 MG capsule Take 2 capsules by mouth 3 times a day 180 capsule 2   glucose blood (ACCU-CHEK AVIVA PLUS) test strip Test sugars daily 100 each 3   guaiFENesin (MUCINEX) 600 MG 12 hr tablet Take 1 tablet (600 mg total) by mouth 2 (two)  times daily. (Patient not taking: Reported on 04/15/2022) 30 tablet 0   lisinopril (ZESTRIL) 10 MG tablet Take 1 tablet by mouth every day 30 tablet 4   methocarbamol (ROBAXIN) 500 MG tablet Take 1 tablet by mouth 3 times a day as needed for muscle spasms 30 tablet 11   Multiple Vitamin (MULTIVITAMIN WITH MINERALS) TABS tablet Take 1 tablet by mouth daily.     nitroGLYCERIN (NITROSTAT) 0.4 MG SL tablet Place 1 tablet (0.4 mg total) under the tongue every 5 (five) minutes as needed for chest pain. 20 tablet 3   nystatin (MYCOSTATIN/NYSTOP) powder Apply 1 application. topically 3 (three) times daily. 60 g 1   pantoprazole (PROTONIX) 40 MG tablet Take 1 tablet by mouth twice daily 60 tablet 4   potassium chloride SA (K-DUR) 20 MEQ tablet Take 20 mEq by mouth daily as needed (with lasix).     pramipexole (MIRAPEX) 0.5 MG tablet Take 1 tablet by mouth at bedtime 30 tablet 2   sertraline (ZOLOFT) 100 MG tablet Take 2 tablets (200 mg total) by mouth daily. 180 tablet 3   No current facility-administered medications for this visit.     Past Surgical History:  Procedure Laterality Date   CHOLECYSTECTOMY     COLONOSCOPY  10/15/2004   Normal rectum/Normal colon   COLONOSCOPY N/A 05/10/2016   Procedure: COLONOSCOPY;  Surgeon: Danie Binder, MD;  [facility];  Service: Endoscopy;  Laterality: N/A;  10:30   ESOPHAGEAL MANOMETRY N/A 04/02/2019   Procedure: ESOPHAGEAL MANOMETRY (EM);  Surgeon: Mauri Pole, MD;  Location: [facility];  Service: Endoscopy;  Laterality: N/A;   ESOPHAGOGASTRODUODENOSCOPY  09/11/2010   Dysphagia likely multifactorial (possible Candida esophagitis, likely nonspecific esophageal motility disorder, and/or uncontrolled gastroesophageal reflux disease), status post empiric dilation   ESOPHAGOGASTRODUODENOSCOPY N/A 11/13/2018   Dr. Oneida Alar: No endoscopic esophageal abnormality to explain patient's dysphagia.  Esophagus dilated.  Moderate erosive/nodular gastritis  with benign biopsies.   KIDNEY STONE SURGERY     SAVORY DILATION N/A 11/13/2018   Procedure: SAVORY DILATION;  Surgeon: Danie Binder, MD;  Location: [facility];  Service: Endoscopy;  Laterality: N/A;     Allergies  Allergen Reactions   Farxiga [Dapagliflozin] Swelling   Penicillins Itching and Rash    Has patient had a PCN reaction causing immediate rash, facial/tongue/throat swelling, SOB or lightheadedness with hypotension: no Has patient had a PCN reaction causing severe rash involving mucus membranes or skin necrosis: no Has patient had a PCN reaction that required hospitalization: no Has patient had a PCN reaction occurring within the last 10  years: no If all of the above answers are "NO", then may proceed with Cephalosporin use.    Sulfa Antibiotics Rash      Family History  Problem Relation Age of Onset   Coronary artery disease Father    COPD Mother    Asthma Brother    Coronary artery disease Sister    Diabetes Sister    Coronary artery disease Brother    Coronary artery disease Sister      Social History Ms. Iannacone reports that she quit smoking about 27 years ago. Her smoking use included cigarettes. She started smoking about 57 years ago. She has a 7.50 pack-year smoking history. She has been exposed to tobacco smoke. She has never used smokeless tobacco. Ms. Fradette reports no history of alcohol use.   Review of Systems CONSTITUTIONAL: No weight loss, fever, chills, weakness or fatigue.  HEENT: Eyes: No visual loss, blurred vision, double vision or yellow sclerae.No hearing loss, sneezing, congestion, runny nose or sore throat.  SKIN: No rash or itching.  CARDIOVASCULAR: per hpi RESPIRATORY: No shortness of breath, cough or sputum.  GASTROINTESTINAL: No anorexia, nausea, vomiting or diarrhea. No abdominal pain or blood.  GENITOURINARY: No burning on urination, no polyuria NEUROLOGICAL: No headache, dizziness, syncope, paralysis, ataxia, numbness or  tingling in the extremities. No change in bowel or bladder control.  MUSCULOSKELETAL: No muscle, back pain, joint pain or stiffness.  LYMPHATICS: No enlarged nodes. No history of splenectomy.  PSYCHIATRIC: No history of depression or anxiety.  ENDOCRINOLOGIC: No reports of sweating, cold or heat intolerance. No polyuria or polydipsia.  Marland Kitchen   Physical Examination Today's Vitals   04/23/22 1051  BP: 126/76  Pulse: 63  SpO2: 93%  Weight: 223 lb (101.2 kg)  Height: 5\' 7"  (1.702 m)   Body mass index is 34.93 kg/m.  Gen: resting comfortably, no acute distress HEENT: no scleral icterus, pupils equal round and reactive, no palptable cervical adenopathy,  CV: RRR, no mrg, no jvd Resp: Clear to auscultation bilaterally GI: abdomen is soft, non-tender, non-distended, normal bowel sounds, no hepatosplenomegaly MSK: extremities are warm, no edema.  Skin: warm, no rash Neuro:  no focal deficits Psych: appropriate affect   Diagnostic Studies  11/2013 Echo Study Conclusions  - Procedure narrative: Transthoracic echocardiography. Image quality was suboptimal. The study was technically difficult, as a result of poor sound wave transmission and body habitus. - Left ventricle: The cavity size was normal. Wall thickness was increased in a pattern of mild LVH. Systolic function was low normal. The estimated ejection fraction was approximately 50%. Images were inadequate for LV wall motion assessment, but no gross regional variation was noted. Features are consistent with a pseudonormal left ventricular filling pattern, with concomitant abnormal relaxation and increased filling pressure (grade 2 diastolic dysfunction). Doppler parameters are consistent with both elevated ventricular end-diastolic filling pressure and elevated left atrial filling pressure. - Mitral valve: Mildly calcified annulus. Mildly thickened leaflets . There was mild regurgitation. - Left atrium: The atrium was  moderately to severely dilated.   11/2015  Nuclear stress There was no ST segment deviation noted during stress. No T wave inversion was noted during stress. Findings consistent with prior myocardial infarction. This is a low risk study. The left ventricular ejection fraction is mildly decreased (45-54%).   Small inferolateral wall infarct at mid and basal level EF estimated 50% but looks normal no ischemia   10/2021 echo IMPRESSIONS     1. Left ventricular ejection fraction, by estimation, is 50  to 55%. The  left ventricle has low normal function. The left ventricle has no regional  wall motion abnormalities. The left ventricular internal cavity size was  mildly dilated. Left ventricular  diastolic parameters are indeterminate. Elevated left atrial pressure.   2. Right ventricular systolic function is normal. The right ventricular  size is normal. Tricuspid regurgitation signal is inadequate for assessing  PA pressure.   3. Left atrial size was mildly dilated.   4. The mitral valve is abnormal. Mild to moderate mitral valve  regurgitation. No evidence of mitral stenosis.   5. The aortic valve has an indeterminant number of cusps. Aortic valve  regurgitation is not visualized. No aortic stenosis is present.   6. Aortic dilatation noted. There is mild dilatation of the ascending  aorta, measuring 36 mm.   7. The inferior vena cava is normal in size with greater than 50%  respiratory variability, suggesting right atrial pressure of 3 mmHg.     Assessment and Plan  1. Chronic diastolic heart failure -fluid controlled with prn lasix, continue current meds   2. HTN -bp at goal, continue current meds   3. Bradycardia - chronic sinus bradycardia tthat has been asymptomatic, rates last several visits has been normal - no recent issues    4. Hyperlipidemia - LDL at goal, discussed dietary changes to lower TGs           Arnoldo Lenis, M.D.

## 2022-04-23 NOTE — Patient Instructions (Addendum)

## 2022-04-25 DIAGNOSIS — I5030 Unspecified diastolic (congestive) heart failure: Secondary | ICD-10-CM

## 2022-04-25 DIAGNOSIS — N1831 Chronic kidney disease, stage 3a: Secondary | ICD-10-CM

## 2022-04-25 DIAGNOSIS — I130 Hypertensive heart and chronic kidney disease with heart failure and stage 1 through stage 4 chronic kidney disease, or unspecified chronic kidney disease: Secondary | ICD-10-CM

## 2022-05-03 ENCOUNTER — Other Ambulatory Visit: Payer: Self-pay | Admitting: Family Medicine

## 2022-05-03 DIAGNOSIS — K2100 Gastro-esophageal reflux disease with esophagitis, without bleeding: Secondary | ICD-10-CM

## 2022-05-03 DIAGNOSIS — E782 Mixed hyperlipidemia: Secondary | ICD-10-CM

## 2022-05-05 ENCOUNTER — Encounter: Payer: Self-pay | Admitting: *Deleted

## 2022-05-05 ENCOUNTER — Ambulatory Visit: Payer: Self-pay | Admitting: *Deleted

## 2022-05-05 NOTE — Patient Outreach (Signed)
  Care Coordination   Follow Up Visit Note   05/05/2022  Name: Allison Rollins MRN: [id number] DOB: [birth year]  Allison Rollins is a 72 y.o. year old female who sees Gwenlyn Perking, FNP for primary care. I spoke with Allison Rollins by phone today.  What matters to the patients health and wellness today?  Find Help in My Community.    Goals Addressed               This Visit's Progress     Find Help in My Community. (pt-stated)   On track     Care Coordination Interventions:  Active Listening & Reflection Utilized.  Verbalization of Feelings Encouraged.  Emotional Support Provided. Problem Solving Interventions Employed. Solution-Focused Strategies Activated. Task-Centered Solutions Revised. Continue Contacting The Following List of [facility], In An Effort to [place]: ~ [employer] [phone]# [phone]) ~ [employer] 613-859-4198) ~ Lot (747) 118-8668 (# 506-159-3293) ~ [place] [phone]# 863-330-8281) ~ [place] [phone]) ~ [employer] ([phone]# 254-297-3602) ~ [employer] (# [phone]) ~ [place] (# [phone]) ~ [place] (# (520)520-6202) ~ [place] [phone]) ~ Aging, [place] of [place] [phone]) Continue to [place] through [employer] at [employer] Continue to Receive $180.00 Per Month Grocery Allowance, As A Benefit of Aetna Medicare.   Continue to Receive $105.00 Per Month Over-The-Counter Allowance, As A Benefit of Aetna Medicare.        SDOH assessments and interventions completed:  Yes.  Care Coordination Interventions:  Yes, provided.   Follow up plan: Follow up call scheduled for 05/19/2022 at 2:45 pm.  Encounter Outcome:  Pt. Visit Completed.   Nat Christen, BSW, MSW, LCSW  Licensed Engineer, [facility]  Mailing [place] [address] [place], [place] [address] Physical Address-[address], [place], [place] [address] Toll Free Main # [phone] Fax # [phone] Cell # [phone] [email or web address]

## 2022-05-05 NOTE — Patient Instructions (Signed)
Visit Information  Thank you for taking time to visit with me today. Please don't hesitate to contact me if I can be of assistance to you.   Following are the goals we discussed today:   Goals Addressed               This Visit's Progress     Find Help in My Community. (pt-stated)   On track     Care Coordination Interventions:  Active Listening & Reflection Utilized.  Verbalization of Feelings Encouraged.  Emotional Support Provided. Problem Solving Interventions Employed. Solution-Focused Strategies Activated. Task-Centered Solutions Revised. Continue Contacting The Following List of [facility], In An Effort to [place]: ~ [employer] [phone]# [phone]) ~ [employer] [phone]) ~ Lot 303-277-8516 (# (864) [phone]) ~ [place] (772)739-8798# [phone]) ~ [place] [phone]) ~ [employer] [phone]) [phone]# 636-691-7151) ~ [employer] (# (667)500-5899) ~ [place] (# (507)623-0554) ~ [place] (# (669) 563-7499) ~ [place] 812-748-6914) ~ Aging, [place] of [place] [phone]) [phone]) Continue to [place] through [employer] Continue to Receive $180.00 Per Month Grocery Allowance, As A Benefit of Aetna Medicare.   Continue to Receive $105.00 Per Month Over-The-Counter Allowance, As A Benefit of Aetna Medicare.        Our next appointment is by telephone on 05/19/2022 at 2:45 pm.  Please call the care guide team at [phone] if you need to cancel or reschedule your appointment.   If you are experiencing a Mental Health or [place] or need someone to talk to, please call the Suicide and Crisis Lifeline: 988 call the [place] National Suicide Prevention Lifeline: 5632064822 or TTY: 980-024-7894 TTY 220-049-9497) to talk to a trained counselor call  1-800-273-TALK (toll free, 24 hour hotline) go to [facility] [address], [place] [phone]) call the [place]: [phone] call 911  Patient verbalizes understanding of instructions and care plan provided today and agrees to view in [place]. Active MyChart status and patient understanding of how to access instructions and care plan via MyChart confirmed with patient.     Telephone follow up appointment with care management team member scheduled for:  05/19/2022 at 2:45 pm.    Nat Christen, BSW, MSW, [place]  Licensed Clinical Social Worker  [place]  Mailing [place]. [address], [place], [place] [address] Physical Address-[address], [place], [place] [address] Toll Free Main # [phone] Fax # [phone] Cell # [phone] [email or web address]

## 2022-05-07 ENCOUNTER — Telehealth: Payer: Medicare Other

## 2022-05-09 ENCOUNTER — Other Ambulatory Visit: Payer: Self-pay | Admitting: Family Medicine

## 2022-05-09 DIAGNOSIS — J301 Allergic rhinitis due to pollen: Secondary | ICD-10-CM

## 2022-05-14 DIAGNOSIS — W19XXXA Unspecified fall, initial encounter: Secondary | ICD-10-CM | POA: Diagnosis not present

## 2022-05-14 DIAGNOSIS — R404 Transient alteration of awareness: Secondary | ICD-10-CM | POA: Diagnosis not present

## 2022-05-14 DIAGNOSIS — R531 Weakness: Secondary | ICD-10-CM | POA: Diagnosis not present

## 2022-05-19 ENCOUNTER — Telehealth: Payer: Self-pay | Admitting: Family Medicine

## 2022-05-19 ENCOUNTER — Ambulatory Visit: Payer: Self-pay | Admitting: *Deleted

## 2022-05-19 ENCOUNTER — Encounter: Payer: Self-pay | Admitting: *Deleted

## 2022-05-19 NOTE — Patient Instructions (Signed)
Visit Information  Thank you for taking time to visit with me today. Please don't hesitate to contact me if I can be of assistance to you.   Following are the goals we discussed today:   Goals Addressed               This Visit's Progress     Find Help in My Community. (pt-stated)   On track     Care Coordination Interventions:  Problem Solving Interventions Enacted. Solution-Focused Strategies Employed. Task-Centered Solutions Implemented. Active Listening & Reflection Utilized.  Verbalization of Feelings Encouraged.  Emotional Support Provided. Feelings of Frustration Validated. Acceptance & Commitment Therapy Introduced. Brief Cognitive Behavioral Therapy Performed. Continue Contacting The Following List of [facility], In An Effort to [place]: ~ [employer] 8108855264# [phone]) ~ [employer] ([phone]) ~ Lot 402-200-4635 (# 216-331-9742) ~ [place] 5344358773# [phone]) ~ [place] [phone]) ~ [employer] (223) [phone]# (610) [phone]) ~ [employer] (# 315-802-7219) ~ [place] (# [phone]) ~ [place] (# 573-561-0657) ~ [place] [phone]) ~ Aging, [place] of [place] (501)[phone]) Continue to [place] through [employer] Continue to Receive $180.00 Per Month Grocery Allowance, As A Benefit of Aetna Medicare.   Continue to Receive $105.00 Per Month Over-The-Counter Allowance, As A Benefit of Aetna Medicare. Please Contact CSW Directly (# [phone]), if You Have Questions, Need Assistance with Outreaching to [place], or If Additional Social Work Needs Are Identified Between Now & Our Next Scheduled Telephone [employer].        Our next appointment is by telephone on 06/09/2022 at 9:00 am.  Please  call the care guide team at [phone] if you need to cancel or reschedule your appointment.   If you are experiencing a Mental Health or [place] or need someone to talk to, please call the Suicide and Crisis Lifeline: 988 call the [place] National Suicide Prevention Lifeline: 267-089-9213 or TTY: (220)250-3982 TTY (830) 605-0690) to talk to a trained counselor call 1-800-273-TALK (toll free, 24 hour hotline) go to [facility] [address], [place] [phone]) call the [place]: ([phone] call 911  Patient verbalizes understanding of instructions and care plan provided today and agrees to view in [place]. Active MyChart status and patient understanding of how to access instructions and care plan via MyChart confirmed with patient.     Telephone follow up appointment with care management team member scheduled for:  06/09/2022 at 9:00 am.  Nat Christen, BSW, MSW, [place]  Licensed Clinical Social Worker  [place]  Mailing [place]. [address] [place], [place] [address] Physical Address-[address] [place], [place] [address] Toll Free Main # [phone] Fax # [phone] Cell # [phone] [email or web address]

## 2022-05-19 NOTE — Patient Outreach (Signed)
  Care Coordination   Follow Up Visit Note   05/19/2022  Name: MARINDA TYER MRN: [id number] DOB: [birth year]  [name] is a 72 y.o. year old female who sees Gwenlyn Perking, FNP for primary care. I spoke with [name] by phone today.  What matters to the patients health and wellness today?  Find Help in My Community.    Goals Addressed               This Visit's Progress     Find Help in My Community. (pt-stated)   On track     Care Coordination Interventions:  Problem Solving Interventions Enacted. Solution-Focused Strategies Employed. Task-Centered Solutions Implemented. Active Listening & Reflection Utilized.  Verbalization of Feelings Encouraged.  Emotional Support Provided. Feelings of Frustration Validated. Acceptance & Commitment Therapy Introduced. Brief Cognitive Behavioral Therapy Performed. Continue Contacting The Following List of [facility], In An Effort to [place]: ~ [employer] [phone]# [phone]) ~ [employer] [phone]) ~ Lot 424-724-8708 (# [phone]) ~ [place] [phone]# [phone]) ~ [place] [phone]) ~ [employer] [phone]# [phone]) ~ [employer] (# 956-345-5156) ~ [place] (# [phone]) ~ [place] (# (201)[phone]) ~ [place] [phone]) ~ Aging, [place] of [place] [phone]) Continue to [place] through [employer] Continue to Receive $180.00 Per Month Grocery Allowance, As A Benefit of Aetna Medicare.   Continue to Receive $105.00 Per Month Over-The-Counter Allowance, As A Benefit of Aetna Medicare. Please Contact CSW Directly (# [phone]), if You Have Questions, Need Assistance with Outreaching to [place], or If Additional Social Work Needs Are  Identified Between Now & Our Next Scheduled Telephone [employer].        SDOH assessments and interventions completed:  Yes.  Care Coordination Interventions:  Yes, provided.   Follow up plan: Follow up call scheduled for 06/09/2022 at 9:00 am.  Encounter Outcome:  Pt. Visit Completed.   Nat Christen, BSW, MSW, LCSW  Licensed Education officer, [facility]  Mailing [place] [address], [place], [place] [address] Physical Address-[address], [place], [place] [address] Toll Free Main # [phone] Fax # [phone] Cell # [phone] [email or web address]

## 2022-05-28 ENCOUNTER — Emergency Department (HOSPITAL_COMMUNITY)
Admission: EM | Admit: 2022-05-28 | Discharge: 2022-05-28 | Disposition: A | Discharge status: Home / Self Care | Payer: Medicare HMO | Attending: Emergency Medicine | Admitting: Emergency Medicine

## 2022-05-28 ENCOUNTER — Ambulatory Visit (INDEPENDENT_AMBULATORY_CARE_PROVIDER_SITE_OTHER): Payer: Medicare HMO | Admitting: Family Medicine

## 2022-05-28 ENCOUNTER — Other Ambulatory Visit: Payer: Self-pay

## 2022-05-28 ENCOUNTER — Encounter: Payer: Self-pay | Admitting: Family Medicine

## 2022-05-28 VITALS — BP 104/79 | HR 124 | Temp 98.0°F | Ht 67.0 in | Wt 225.5 lb

## 2022-05-28 DIAGNOSIS — I5032 Chronic diastolic (congestive) heart failure: Secondary | ICD-10-CM

## 2022-05-28 DIAGNOSIS — N189 Chronic kidney disease, unspecified: Secondary | ICD-10-CM | POA: Insufficient documentation

## 2022-05-28 DIAGNOSIS — N1831 Chronic kidney disease, stage 3a: Secondary | ICD-10-CM | POA: Diagnosis not present

## 2022-05-28 DIAGNOSIS — I4891 Unspecified atrial fibrillation: Secondary | ICD-10-CM | POA: Diagnosis not present

## 2022-05-28 DIAGNOSIS — Z6835 Body mass index (BMI) 35.0-35.9, adult: Secondary | ICD-10-CM | POA: Diagnosis not present

## 2022-05-28 DIAGNOSIS — R2689 Other abnormalities of gait and mobility: Secondary | ICD-10-CM | POA: Diagnosis not present

## 2022-05-28 DIAGNOSIS — I10 Essential (primary) hypertension: Secondary | ICD-10-CM

## 2022-05-28 DIAGNOSIS — I499 Cardiac arrhythmia, unspecified: Secondary | ICD-10-CM

## 2022-05-28 DIAGNOSIS — Z7951 Long term (current) use of inhaled steroids: Secondary | ICD-10-CM | POA: Diagnosis not present

## 2022-05-28 DIAGNOSIS — N3000 Acute cystitis without hematuria: Secondary | ICD-10-CM

## 2022-05-28 DIAGNOSIS — I509 Heart failure, unspecified: Secondary | ICD-10-CM | POA: Insufficient documentation

## 2022-05-28 DIAGNOSIS — R5383 Other fatigue: Secondary | ICD-10-CM | POA: Diagnosis present

## 2022-05-28 DIAGNOSIS — I130 Hypertensive heart and chronic kidney disease with heart failure and stage 1 through stage 4 chronic kidney disease, or unspecified chronic kidney disease: Secondary | ICD-10-CM

## 2022-05-28 DIAGNOSIS — J449 Chronic obstructive pulmonary disease, unspecified: Secondary | ICD-10-CM | POA: Diagnosis not present

## 2022-05-28 DIAGNOSIS — I480 Paroxysmal atrial fibrillation: Secondary | ICD-10-CM | POA: Insufficient documentation

## 2022-05-28 DIAGNOSIS — R269 Unspecified abnormalities of gait and mobility: Secondary | ICD-10-CM

## 2022-05-28 DIAGNOSIS — Z7901 Long term (current) use of anticoagulants: Secondary | ICD-10-CM | POA: Diagnosis not present

## 2022-05-28 DIAGNOSIS — R7303 Prediabetes: Secondary | ICD-10-CM | POA: Diagnosis not present

## 2022-05-28 DIAGNOSIS — Z79899 Other long term (current) drug therapy: Secondary | ICD-10-CM | POA: Diagnosis not present

## 2022-05-28 DIAGNOSIS — Z743 Need for continuous supervision: Secondary | ICD-10-CM | POA: Diagnosis not present

## 2022-05-28 DIAGNOSIS — R6889 Other general symptoms and signs: Secondary | ICD-10-CM | POA: Diagnosis not present

## 2022-05-28 LAB — URINALYSIS, ROUTINE W REFLEX MICROSCOPIC
Bilirubin Urine: NEGATIVE
Glucose, UA: >=500 mg/dL — AB
Hgb urine dipstick: NEGATIVE
Ketones, ur: NEGATIVE mg/dL
Nitrite: POSITIVE — AB
Protein, ur: NEGATIVE mg/dL
Specific Gravity, Urine: 1.024 (ref 1.005–1.030)
pH: 5 (ref 5.0–8.0)

## 2022-05-28 LAB — CBC WITH DIFFERENTIAL/PLATELET
Abs Immature Granulocytes: 0.02 10*3/uL (ref 0.00–0.07)
Basophils Absolute: 0 10*3/uL (ref 0.0–0.1)
Basophils Relative: 0 %
Eosinophils Absolute: 0.1 10*3/uL (ref 0.0–0.5)
Eosinophils Relative: 2 %
HCT: 41.5 % (ref 36.0–46.0)
Hemoglobin: 13.2 g/dL (ref 12.0–15.0)
Immature Granulocytes: 0 %
Lymphocytes Relative: 16 %
Lymphs Abs: 1.1 10*3/uL (ref 0.7–4.0)
MCH: 30.1 pg (ref 26.0–34.0)
MCHC: 31.8 g/dL (ref 30.0–36.0)
MCV: 94.7 fL (ref 80.0–100.0)
Monocytes Absolute: 0.4 10*3/uL (ref 0.1–1.0)
Monocytes Relative: 6 %
Neutro Abs: 5.3 10*3/uL (ref 1.7–7.7)
Neutrophils Relative %: 76 %
Platelets: 211 10*3/uL (ref 150–400)
RBC: 4.38 MIL/uL (ref 3.87–5.11)
RDW: 14.1 % (ref 11.5–15.5)
WBC: 6.9 10*3/uL (ref 4.0–10.5)
nRBC: 0 % (ref 0.0–0.2)

## 2022-05-28 LAB — BASIC METABOLIC PANEL
Anion gap: 7 (ref 5–15)
BUN: 9 mg/dL (ref 8–23)
CO2: 31 mmol/L (ref 22–32)
Calcium: 8.9 mg/dL (ref 8.9–10.3)
Chloride: 105 mmol/L (ref 98–111)
Creatinine, Ser: 1.09 mg/dL — ABNORMAL HIGH (ref 0.44–1.00)
GFR, Estimated: 54 mL/min — ABNORMAL LOW (ref >60)
Glucose, Bld: 109 mg/dL — ABNORMAL HIGH (ref 70–99)
Potassium: 4.6 mmol/L (ref 3.5–5.1)
Sodium: 143 mmol/L (ref 135–145)

## 2022-05-28 LAB — BAYER DCA HB A1C WAIVED: HB A1C (BAYER DCA - WAIVED): 5.7 % — ABNORMAL HIGH (ref 4.8–5.6)

## 2022-05-28 MED ORDER — APIXABAN 5 MG PO TABS: 5.0000 mg | ORAL_TABLET | Freq: Two times a day (BID) | ORAL | 0 refills | Status: DC

## 2022-05-28 MED ORDER — METOPROLOL TARTRATE 25 MG PO TABS: 25.0000 mg | ORAL_TABLET | Freq: Two times a day (BID) | ORAL | 0 refills | Status: DC

## 2022-05-28 MED ORDER — FOSFOMYCIN TROMETHAMINE 3 G PO PACK
3.0000 g | PACK | Freq: Once | ORAL | Status: AC
  Administered 2022-05-28: 3 g via ORAL
  Filled 2022-05-28: qty 3

## 2022-05-28 NOTE — ED Notes (Signed)
Assisted pt to the bathroom, unsteady on feet, wheelchair used. Urine sample collected.

## 2022-05-28 NOTE — Patient Instructions (Signed)
[facility]   Neurologist in [place], [place]  Get online care: [email or web address] Address: [address], [place], [place] [address] Phone: [phone]

## 2022-05-28 NOTE — Discharge Instructions (Addendum)
It was a pleasure taking care of you today!  For your A-fib we started you on Eliquis which is a blood thinner.  You will stop taking aspirin.  Do not take any NSAIDs while you are taking Eliquis this includes but is not limited to ibuprofen, Advil, Motrin, Aleve.  You will also be sent a prescription for metoprolol to tartrate (Lopressor), take this medication as prescribed. If you begin to feel lightheaded, short of breath, chest pain or pressure, or feel like passing out, call 911 and come back to the ER.  Please avoid caffeine, alcohol, smoking, any recreational drugs, or high heat or heat exhaustion.  These are all triggers for atrial fibrillation.  Please carefully review the packet that is provided at the pharmacy with Eliquis.  Pay attention to signs of internal bleeding.  This would include dark or bloody stools, lightheadedness, skin pallor, or shortness of breath.  You should avoid any contact sports or high risk activities while you are on blood thinners.  Finally, call your cardiology office and ask for follow-up appointment.  You also be provided with information for the A-fib clinic for follow-up.  Ultimately your management and your medications will be determined by your cardiologist.  Your urine also showed concern for a UTI, this was treated in the emergency department with a one-time dose of fosfomycin.  You were also given a walker today in the emergency department to replace your broken walker at home.   Return to the emergency department if you experience increasing/worsening symptoms.

## 2022-05-28 NOTE — Progress Notes (Signed)
Established Patient Office Visit  Subjective   Patient ID: Allison Rollins, female    DOB: 1950/05/04  Age: 72 y.o. MRN: [id number]  Chief Complaint  Patient presents with   Medical Management of Chronic Issues   Prediabetes   Chronic Kidney Disease    HPI  1.Prediabetes She is on jardiance for HF. She tries to avoid sweets. Does not check blood sugars. She is on a statin. She is on lisinopril.   2. HTN Complaint with meds - Yes Current Medications - lisinopril, lasix Watching Salt intake - Yes Pertinent ROS:  Headache - No Visual Disturbances - Yes Chest pain - No Dyspnea - No Palpitations - No LE edema - yes, increased edema in her lower legs.  Dizziness last week that caused a fall.   Sees cardiology for HF. She is also on jardiance, aspirin, and a statin. Also on omega 3.   3. Parkinson's disease She has a history of parkinson's disease. She used to see Jacqlyn Krauss but has not in years. She denies tremor. She does have muscle stiffness and shuffling gait. She takes robaxin prn for this. She does report shuffling gait as well and RLS. She takes mirapex for RLS.   4. Neuropathy She has peripherial neuropathy. She takes gapabenin for this is good relief.   5. COPD She has history of COPD. She uses her albuterol a few times a week prn.  Denies wheezing, chronic cough, or sputum. She reports dyspnea with activity that is chronic. She does not smoke.   6. GERD Compliant with medications - Yes Current medications - Protonix, pepcid Cough - No Sore throat - No Voice change - No Hemoptysis - No Dysphagia or dyspepsia - No Water brash - No Red Flags (weight loss, hematochezia, melena, weight loss, early satiety, fevers, odynophagia, or persistent vomiting) - No She has had surgery on her esophagus in the past for an esophageal obstruction in 2021. Not established with GI.   7. Impaired mobility She uses a walker for ambulation. She would like a rolling walker with a seat  so that she can stop to rest if needed.   8. Anxiety and depression Reports improvement with increase in zoloft.      04/16/2022   12:54 PM 03/24/2022   11:17 AM 02/24/2022    2:19 PM  Depression screen PHQ 2/9  Decreased Interest 0 0 3  Down, Depressed, Hopeless 0 0 3  PHQ - 2 Score 0 0 6  Altered sleeping  0 0  Tired, decreased energy  0 1  Change in appetite  0 3  Feeling bad or failure about yourself   0 0  Trouble concentrating  0 0  Moving slowly or fidgety/restless  0 2  Suicidal thoughts  0 0  PHQ-9 Score  0 12  Difficult doing work/chores  Not difficult at all Somewhat difficult      05/28/2022   11:11 AM 02/24/2022    2:20 PM 06/18/2021   10:17 AM 04/03/2021    2:30 PM  GAD 7 : Generalized Anxiety Score  Nervous, Anxious, on Edge 2 1 1 1   Control/stop worrying 2 1 1 2   Worry too much - different things 2 1 2 1   Trouble relaxing 2 0 2 1  Restless 1 0 1 1  Easily annoyed or irritable 3 0 2 1  Afraid - awful might happen 0 0 0 1  Total GAD 7 Score 12 3 9 8   Anxiety Difficulty Not difficult  at all Not difficult at all Extremely difficult Not difficult at all      Past Medical History:  Diagnosis Date   Anxiety    Asthma    Chest pain 12/22/2015   CHF (congestive heart failure) (HCC)    COPD (chronic obstructive pulmonary disease) (HCC)    Depression    Headache    HTN (hypertension)    Hypokalemia 07/28/2013   Kidney stones    Osteopenia 01/22/2020   Parkinson's disease    Pneumonia    Pre-diabetes    S/P colonoscopy 10/15/04   normal   Vitamin D deficiency 11/24/2018      ROS As per HPI.   Objective:     BP 104/79   Pulse (!) 101   Temp 98 F (36.7 C) (Temporal)   Ht 5\' 7"  (1.702 m)   Wt 225 lb 8 oz (102.3 kg)   SpO2 90%   BMI 35.32 kg/m  BP Readings from Last 3 Encounters:  05/28/22 104/79  04/23/22 126/76  02/24/22 114/65   Wt Readings from Last 3 Encounters:  05/28/22 225 lb 8 oz (102.3 kg)  04/23/22 223 lb (101.2 kg)  03/24/22  222 lb (100.7 kg)     Physical Exam Vitals and nursing note reviewed.  Constitutional:      General: She is not in acute distress.    Appearance: She is obese. She is not ill-appearing, toxic-appearing or diaphoretic.  HENT:     Head: Normocephalic and atraumatic.  Eyes:     General:        Right eye: No discharge.        Left eye: No discharge.     Conjunctiva/sclera: Conjunctivae normal.     Pupils: Pupils are equal, round, and reactive to light.  Cardiovascular:     Rate and Rhythm: Tachycardia present. Rhythm irregularly irregular.     Heart sounds: Normal heart sounds. No murmur heard. Pulmonary:     Effort: Pulmonary effort is normal. No respiratory distress.     Breath sounds: Normal breath sounds.  Abdominal:     General: Bowel sounds are normal. There is no distension.     Palpations: Abdomen is soft.     Tenderness: There is no abdominal tenderness. There is no guarding or rebound.  Musculoskeletal:     Right lower leg: 2+ Edema present.     Left lower leg: 2+ Edema present.  Skin:    General: Skin is warm and dry.  Neurological:     Mental Status: She is alert and oriented to person, place, and time.     Gait: Gait abnormal (using walker).     Comments: Shuffling gait  Psychiatric:        Mood and Affect: Mood normal.        Behavior: Behavior normal.    EKG: a fib.   No results found for any visits on 05/28/22.  Last CBC Lab Results  Component Value Date   WBC 7.7 02/24/2022   HGB 15.4 02/24/2022   HCT 46.5 02/24/2022   MCV 88 02/24/2022   MCH 29.2 02/24/2022   RDW 13.8 02/24/2022   PLT 222 62/22/9798   Last metabolic panel Lab Results  Component Value Date   GLUCOSE 96 02/24/2022   NA 142 02/24/2022   K 4.2 02/24/2022   CL 102 02/24/2022   CO2 25 02/24/2022   BUN 12 02/24/2022   CREATININE 0.85 02/24/2022   GFRNONAA >60 09/06/2021   CALCIUM 9.6 02/24/2022  PHOS 3.0 07/30/2013   PROT 6.3 02/24/2022   ALBUMIN 4.1 02/24/2022   LABGLOB  2.2 02/24/2022   AGRATIO 1.9 02/24/2022   BILITOT 0.4 02/24/2022   ALKPHOS 102 02/24/2022   AST 23 02/24/2022   ALT 14 02/24/2022   ANIONGAP 6 09/06/2021   Last lipids Lab Results  Component Value Date   CHOL 158 02/24/2022   HDL 39 (L) 02/24/2022   LDLCALC 85 02/24/2022   TRIG 203 (H) 02/24/2022   CHOLHDL 4.1 02/24/2022   Last hemoglobin A1c Lab Results  Component Value Date   HGBA1C 5.8 (H) 02/24/2022   Last thyroid functions Lab Results  Component Value Date   TSH 1.000 02/24/2022   T4TOTAL 6.4 06/17/2020      The 10-year ASCVD risk score (Arnett DK, et al., 2019) is: 19.1%    Assessment & Plan:   Keliah was seen today for medical management of chronic issues, prediabetes and chronic kidney disease.  Diagnoses and all orders for this visit:  Irregular heart rhythm New onset atrial fibrillation (HCC) -     EKG 56-PVXY  Chronic diastolic heart failure (HCC)  Primary hypertension  Morbid obesity (HCC)  Stage 3a chronic kidney disease ([place]) Labs pending.  -     CMP14+EGFR -     VITAMIN D 25 Hydroxy (Vit-D Deficiency, Fractures)  Prediabetes Well controlled with A1c of 5.7 -     Bayer DCA Hb A1c Waived  Abnormal gait  EKG with a. Fib with HR of 132 today. This is new onset. She is at high risk given history of CHF. She also has increased edema in BLE today. Discussed case with Dr. Warrick Parisian who recommended ED evaluation. Discussed this with patient and risks of a. Fib including stroke and death. She agreed with ED evaluation. EMS called for transport. We also call and notified her son Leory Plowman per her wish.    The patient indicates understanding of these issues and agrees with the plan.  Gwenlyn Perking, FNP

## 2022-05-28 NOTE — ED Triage Notes (Signed)
Pt bib EMS from PCP office-- new doctor of 5 months, was there for regular check up. Was found on Afib, with history of Afib. Per pt, she is not on thinners and was told they will just monitor it. Denies any symptoms.    130/70 HR 65-75 Afib SPO2 94% CBG 139

## 2022-05-28 NOTE — ED Provider Notes (Signed)
[facility] Provider Note   CSN: [id number] Arrival date & time: 05/28/22  1307     History  Chief Complaint  Patient presents with   Atrial Fibrillation    Allison Rollins is a 72 y.o. female with a PMHx of CHF, hypertension, CKD, COPD who presents to the ED with concerns for Afib onset today PTA. Pt notes that her doctor told her that she had Afib which she has known about for the past 5 months and they have been monitoring it. However, during her visit today was told to come into the ED due to concerns for Afib.  Patient notes that she has fatigue.  Patient denies chest pain, shortness of breath, palpitations, nausea, vomiting.  Denies anticoagulant use.  The history is provided by the patient. No language interpreter was used.       Home Medications Prior to Admission medications   Medication Sig Start Date End Date Taking? Authorizing Provider  acetaminophen (TYLENOL) 500 MG tablet Take 1 tablet (500 mg total) by mouth every 6 (six) hours as needed. 02/16/22  Yes Ivy Lynn, NP  albuterol (PROVENTIL) (2.5 MG/3ML) 0.083% nebulizer solution Take 3 mLs (2.5 mg total) by nebulization every 6 (six) hours as needed for wheezing or shortness of breath. 01/12/18  Yes Rakes, Connye Burkitt, FNP  albuterol (VENTOLIN HFA) 108 (90 Base) MCG/ACT inhaler Inhale 2 puffs into the lungs every 6 (six) hours as needed for wheezing or shortness of breath. 08/18/20  Yes Gwenlyn Perking, FNP  Alcohol Swabs (B-D SINGLE USE SWABS REGULAR) PADS Test sugars daily 05/24/18  Yes Rakes, Connye Burkitt, FNP  ALLERGY RELIEF CETIRIZINE 10 MG tablet Take 1 tablet by mouth every day 01/08/22  Yes Loman Brooklyn, FNP  apixaban (ELIQUIS) 5 MG TABS tablet Take 1 tablet (5 mg total) by mouth 2 (two) times daily. 05/28/22 06/27/22 Yes Jenesys Casseus A, PA-C  Ascorbic Acid (VITAMIN C PO) Take by mouth daily. 3 tablets daily   Yes [provider]  atorvastatin (LIPITOR) 40 MG tablet  Take 1 tablet by mouth every day 05/03/22  Yes Gwenlyn Perking, FNP  CALCIUM PO Take by mouth daily.   Yes [provider]  cholecalciferol (VITAMIN D3) 25 MCG (1000 UT) tablet Take 1,000 Units by mouth 2 (two) times daily.    Yes [provider]  empagliflozin (JARDIANCE) 10 MG TABS tablet Take 1 tablet (10 mg total) by mouth at bedtime. 10/29/21  Yes [name] F, FNP  famotidine (PEPCID) 20 MG tablet Take 1 tablet by mouth at bedtime 05/03/22  Yes Gwenlyn Perking, FNP  fluticasone [facility]) 50 MCG/ACT nasal spray Use 2 sprays into each nostril every day Patient taking differently: Place 2 sprays into both nostrils daily. 05/09/22  Yes Gwenlyn Perking, FNP  furosemide (LASIX) 20 MG tablet Take 1 tablet (20 mg total) by mouth daily as needed. 10/29/21  Yes [name] F, FNP  gabapentin (NEURONTIN) 300 MG capsule Take 2 capsules by mouth 3 times a day 03/04/22  Yes Gwenlyn Perking, FNP  lisinopril (ZESTRIL) 10 MG tablet Take 1 tablet by mouth every day 04/10/22  Yes Gwenlyn Perking, FNP  nitroGLYCERIN (NITROSTAT) 0.4 MG SL tablet Place 1 tablet (0.4 mg total) under the tongue every 5 (five) minutes as needed for chest pain. 12/25/15  Yes Eustaquio Maize, MD  pantoprazole (PROTONIX) 40 MG tablet Take 1 tablet by mouth twice daily 04/10/22  Yes  Gwenlyn Perking, FNP  Potassium 99 MG TABS Take 1 tablet by mouth daily.   Yes [provider]  pramipexole (MIRAPEX) 0.5 MG tablet Take 1 tablet by mouth at bedtime 03/04/22  Yes Gwenlyn Perking, FNP  sertraline (ZOLOFT) 100 MG tablet Take 2 tablets (200 mg total) by mouth daily. 02/24/22  Yes Gwenlyn Perking, FNP  glucose blood (ACCU-CHEK AVIVA PLUS) test strip Test sugars daily 02/29/20   Dettinger, Fransisca Kaufmann, MD  metoprolol tartrate (LOPRESSOR) 25 MG tablet Take 1 tablet (25 mg total) by mouth 2 (two) times daily. 05/28/22 06/27/22  Lathaniel Legate A, PA-C      Allergies    Farxiga [dapagliflozin], Penicillins, and Sulfa  antibiotics    Review of Systems   Review of Systems  All other systems reviewed and are negative.   Physical Exam Updated Vital Signs BP 121/71 (BP Location: Left Arm)   Pulse 68   Temp 98.4 F (36.9 C) (Oral)   Resp 16   SpO2 93%  Physical Exam Vitals and nursing note reviewed.  Constitutional:      General: She is not in acute distress.    Appearance: Normal appearance.  Eyes:     General: No scleral icterus.    Extraocular Movements: Extraocular movements intact.  Cardiovascular:     Rate and Rhythm: Normal rate and regular rhythm.     Pulses: Normal pulses.     Heart sounds: Normal heart sounds.  Pulmonary:     Effort: Pulmonary effort is normal. No respiratory distress.     Breath sounds: Normal breath sounds.  Abdominal:     Palpations: Abdomen is soft. There is no mass.     Tenderness: There is no abdominal tenderness.  Musculoskeletal:        General: Normal range of motion.     Cervical back: Neck supple.  Skin:    General: Skin is warm and dry.     Findings: No rash.  Neurological:     Mental Status: She is alert.     Sensory: Sensation is intact.     Motor: Motor function is intact.  Psychiatric:        Behavior: Behavior normal.     ED Results / Procedures / Treatments   Labs (all labs ordered are listed, but only abnormal results are displayed) Labs Reviewed  URINALYSIS, ROUTINE W REFLEX MICROSCOPIC - Abnormal; Notable for the following components:      Result Value   Glucose, UA >=500 (*)    Nitrite POSITIVE (*)    Leukocytes,Ua SMALL (*)    Bacteria, UA RARE (*)    All other components within normal limits  BASIC METABOLIC PANEL - Abnormal; Notable for the following components:   Glucose, Bld 109 (*)    Creatinine, Ser 1.09 (*)    GFR, Estimated 54 (*)    All other components within normal limits  CBC WITH DIFFERENTIAL/PLATELET  CBG MONITORING, ED    EKG EKG Interpretation  Date/Time:  Friday May 28 2022 13:27:07  EST Ventricular Rate:  59 PR Interval:  170 QRS Duration: 93 QT Interval:  422 QTC Calculation: 418 R Axis:   91 Text Interpretation: Sinus rhythm Multiple premature complexes, vent & supraven Right axis deviation Low voltage, precordial leads Confirmed by Margaretmary Eddy [phone]) on 05/28/2022 1:54:53 PM  Radiology No results found.  Procedures Procedures    Medications Ordered in ED Medications  fosfomycin (MONUROL) packet 3 g (3 g Oral Given 05/28/22 1946)  ED Course/ Medical Decision Making/ A&P Clinical Course as of 05/28/22 2023  Fri May 28, 2022  1628 Consult to cardiologist, Dr. Jenene Slicker who recommends starting anticoagulation with Eliquis if patient's [place] Vascor is above 3.  Also recommends patient discontinue aspirin.  Recommends the patient be sent home with a low-dose metoprolol and follow-up in the office. [SB]  1707 Discussed with patient labs and recommendations as per cardiology [SB]  1936 Patient reevaluated and discussed with patient urinalysis findings.  Patient notes that she walks with a walker at home however her walker broke.  Patient notes that she would like a walker here in the emergency department. [SB]    Clinical Course User Index [SB] Leasa Kincannon A, PA-C                             Medical Decision Making Amount and/or Complexity of Data Reviewed Labs: ordered.  Risk Prescription drug management.   Pt presents with concerns for A-fib.  Pt was instructed to come into the emergency department by her primary care provider following her appointment.  Patient does not take any anticoagulants at this time.  Patient with a history of heart failure.  Upon arrival to the emergency department, heart rate at 68, patient now in sinus rhythm.  Patient is clinically stable, this is likely paroxysmal A-fib.  However differential diagnosis includes atrial fibrillation, atrial flutter.    CHAD2DS-VASc score at 4, pt is considered moderate-high risk for  stroke. HAS-BLED score at 2 points, patient at low risk.  Patient does have a moderate risk for major bleeding at this time.    EKG:  EKG today notable for sinus rhythm.  No evidence of A-fib at this time.   Labs:  I ordered, and personally interpreted labs.  The pertinent results include:  Urinalysis notable for acute cystitis. BMP with slightly elevated creatinine 1.09, CBC without leukocytosis  Medications:  I ordered medication including fosfomycin for acute cystitis treatment I have reviewed the patients home medicines and have made adjustments as needed   Consultations: I requested consultation with the Cardiologist, Dr. Jenene Slicker and discussed lab and imaging findings as well as pertinent plan - they recommend: Starting anticoagulation if patient's CHA2DS2-VASc score is above 3.  Also recommended metoprolol and follow-up with cardiologist and primary care provider.  Disposition: Presentation suspicious for paroxysmal A-fib. Attending evaluated patient and agrees with treatment plan consistent of speaking with cardiologist. After consideration of the diagnostic results and the patients response to treatment, I feel that the patient would benefit from Discharge home.  Pt will be sent a prescription for lopressor and Eliquis. Instructed to follow up with her cardiologist and primary care provider. Also notable for acute cystitis, pt treated with one time dose of fosfomycin in the ED today. Pt provided with walker in the ED as she notes that hers broke at home. She ambulates with a cane and a walker, however, prefers her walker to go up her ramp. Supportive care measures and strict return precautions discussed with patient at bedside. Pt acknowledges and verbalizes understanding. Pt appears safe for discharge. Follow up as indicated in discharge paperwork.    This chart was dictated using voice recognition software, Dragon. Despite the best efforts of this provider to proofread and correct  errors, errors may still occur which can change documentation meaning.  Final Clinical Impression(s) / ED Diagnoses Final diagnoses:  Paroxysmal A-fib (HCC)  Acute cystitis without hematuria  Rx / DC Orders ED Discharge Orders          Ordered    metoprolol tartrate (LOPRESSOR) 25 MG tablet  2 times daily,   Status:  Discontinued        05/28/22 1944    apixaban (ELIQUIS) 5 MG TABS tablet  2 times daily        05/28/22 1944    metoprolol tartrate (LOPRESSOR) 25 MG tablet  2 times daily        05/28/22 1948              Torien Ramroop A, PA-C 05/28/22 2025    Fransico Meadow, MD 05/30/22 1452

## 2022-05-28 NOTE — ED Notes (Signed)
Pt provided walker and signed paperwork confirming she received device.  Pt educated on uses and safety of walker at home.

## 2022-05-29 LAB — CMP14+EGFR
ALT: 13 IU/L (ref 0–32)
AST: 18 IU/L (ref 0–40)
Albumin/Globulin Ratio: 1.9 (ref 1.2–2.2)
Albumin: 4 g/dL (ref 3.8–4.8)
Alkaline Phosphatase: 107 IU/L (ref 44–121)
BUN/Creatinine Ratio: 7 — ABNORMAL LOW (ref 12–28)
BUN: 8 mg/dL (ref 8–27)
Bilirubin Total: 0.5 mg/dL (ref 0.0–1.2)
CO2: 23 mmol/L (ref 20–29)
Calcium: 9.5 mg/dL (ref 8.7–10.3)
Chloride: 106 mmol/L (ref 96–106)
Creatinine, Ser: 1.17 mg/dL — ABNORMAL HIGH (ref 0.57–1.00)
Globulin, Total: 2.1 g/dL (ref 1.5–4.5)
Glucose: 122 mg/dL — ABNORMAL HIGH (ref 70–99)
Potassium: 4.4 mmol/L (ref 3.5–5.2)
Sodium: 144 mmol/L (ref 134–144)
Total Protein: 6.1 g/dL (ref 6.0–8.5)
eGFR: 50 mL/min/{1.73_m2} — ABNORMAL LOW (ref >59)

## 2022-05-29 LAB — VITAMIN D 25 HYDROXY (VIT D DEFICIENCY, FRACTURES): Vit D, 25-Hydroxy: 32.6 ng/mL (ref 30.0–100.0)

## 2022-06-01 ENCOUNTER — Telehealth: Payer: Self-pay | Admitting: Pharmacist

## 2022-06-01 ENCOUNTER — Ambulatory Visit (INDEPENDENT_AMBULATORY_CARE_PROVIDER_SITE_OTHER): Payer: Medicare HMO | Admitting: *Deleted

## 2022-06-01 DIAGNOSIS — I5032 Chronic diastolic (congestive) heart failure: Secondary | ICD-10-CM

## 2022-06-01 DIAGNOSIS — I10 Essential (primary) hypertension: Secondary | ICD-10-CM

## 2022-06-01 NOTE — Telephone Encounter (Signed)
Can you have patient schedule with PharmD clinic 29min; would prefer in person but tele is okay Re-enrollment in patient assistance (jardiance) Patient must bring in financial proof/income documents for approval (jardiance)  Thank you!

## 2022-06-01 NOTE — Patient Instructions (Addendum)
Please call the care guide team at [phone] if you need to cancel or reschedule your appointment.   If you are experiencing a Mental Health or [place] or need someone to talk to, please call the Suicide and Crisis Lifeline: 988 call the [place] National Suicide Prevention Lifeline: 8251187893 or TTY: (602) 417-3641 TTY 571-478-9509) to talk to a trained counselor call 1-800-273-TALK (toll free, 24 hour hotline) go to [facility] [address] [place] [phone]) call the [facility]: [phone] call 911   Following is a copy of the CCM Program Consent:  CCM service includes personalized support from designated clinical staff supervised by the physician, including individualized plan of care and coordination with other care providers 24/7 contact phone numbers for assistance for urgent and routine care needs. Service will only be billed when office clinical staff spend 20 minutes or more in a month to coordinate care. Only one practitioner may furnish and bill the service in a calendar month. The patient may stop CCM services at amy time (effective at the end of the month) by phone call to the office staff. The patient will be responsible for cost sharing (co-pay) or up to 20% of the service fee (after annual deductible is met)  Following is a copy of your full provider care plan:   Goals Addressed             This Visit's Progress    CCM ([place]) EXPECTED OUTCOME: MONITOR, SELF-MANAGE AND REDUCE SYMPTOMS OF CONGESTIVE HEART FAILURE       Current Barriers:  Knowledge Deficits related to Congestive Heart Failure Care Coordination needs related to depression in a patient with Congestive Heart Failure Chronic Disease Management support and education needs related to management of Congestive Heart Failure and diet No Advanced Directives in place- pt declines Patient reports she has a scale  and is now weighing once weekly with weight remaining the same at 222 pounds, patient does not commit to weighing daily.  Patient can benefit from teaching/ reinforcement HF action plan. Patient left voicemail requesting RN care manager return her call.  Spoke with patient who reports she has been out of Jardiance x 1 week due to not being able to afford, states she has not contacted primary care provider office for samples  Patient states she was seen by primary care provider and sent to ED "few days ago because my heart was out of rhythm" (pt went to ED on 05/28/22) Pt reports she was prescribed Eliquis and is taking as prescribed and states " but it's making me nauseous",also prescribed metoprolol  Patient states she "does not feel like heart racing" , no other symptoms reported, pt states she does not have anything to check heart rate with and can try to check it herself if needed, pt states she is to follow up with cardiologist this month, (per notes, pt is to follow up with cardiologist at [place] [facility] to determine a more definitive diagnosis- PAF?)  Planned Interventions: Provided education on low sodium diet Assessed need for readable accurate scales in home Provided education about placing scale on hard, flat surface Advised patient to weigh each morning after emptying bladder Discussed importance of daily weight and advised patient to weigh and record daily Discussed the importance of keeping all appointments with provider Provided patient with education about the role of exercise in the management of heart failure Referral made to community resources care guide team for assistance with food  insecurity Reinforced HF action plan, importance of calling doctor early on for change in health status, symptoms In basket message sent to primary care provider reporting pt feels Eliquis is making her nauseous, ask if zofran may be appropriate or other suggestions In basket message sent to [facility]  pharmacist Lottie Dawson reporting pt has been out of jardiance x 1 week and per previous conversation encouraged pt to get jardiance filled at beginning of the year because donut hole reset, ask if has any samples for the interim Reviewed normal parameters for heart rate and encouraged pt to check heart rate daily  Symptom Management: Take medications as prescribed   Attend all scheduled provider appointments Call pharmacy for medication refills 3-7 days in advance of running out of medications Attend church or other social activities Perform all self care activities independently  Perform IADL's (shopping, preparing meals, housekeeping, managing finances) independently Call provider office for new concerns or questions  Work with the social worker to address care coordination needs and will continue to work with the clinical team to address health care and disease management related needs do ankle pumps when sitting keep legs up while sitting use salt in moderation watch for swelling in feet, ankles and legs every day weigh myself daily develop a rescue plan follow rescue plan if symptoms flare-up eat more whole grains, fruits and vegetables, lean meats and healthy fats track symptoms and what helps feel better or worse dress right for the weather, hot or cold Follow heart failure action plan- call doctor early on for change in health status, symptoms Continue weighing- please consider weighing daily Get outside and walk if you are able Please get jardiance refilled as donut hole has reset Samples have been requested, please follow up with doctor's office for pickup if they have samples Nausea has been reported- your doctor may call something into pharmacy for this Please check your heart rate daily, let your doctor know if >100 Follow up / stay in touch with your primary care provider's office for symptom management or change in health status Go to ED for unrelieved shortness of  breath, if you feel your heart is racing, chest pain  Follow Up Plan: Telephone follow up appointment with care management team member scheduled for:  06/15/22 at 1045 am          The patient verbalized understanding of instructions, educational materials, and care plan provided today and DECLINED offer to receive copy of patient instructions, educational materials, and care plan.   Telephone follow up appointment with care management team member scheduled for:  06/15/22 at 1045 am

## 2022-06-01 NOTE — Chronic Care Management (AMB) (Signed)
Chronic Care Management   CCM RN Visit Note  06/01/2022 Name: Allison Rollins MRN: [id number] DOB: 03/28/1951  Subjective: Allison Rollins is a 72 y.o. year old female who is a primary care patient of Allison Perking, FNP. The patient was referred to the Chronic Care Management team for assistance with care management needs subsequent to provider initiation of CCM services and plan of care.    Today's Visit:  Engaged with patient by telephone for follow up visit.        Goals Addressed             This Visit's Progress    CCM (CONGESTIVE HEART FAILURE) EXPECTED OUTCOME: MONITOR, SELF-MANAGE AND REDUCE SYMPTOMS OF CONGESTIVE HEART FAILURE       Current Barriers:  Knowledge Deficits related to Congestive Heart Failure Care Coordination needs related to depression in a patient with Congestive Heart Failure Chronic Disease Management support and education needs related to management of Congestive Heart Failure and diet No Advanced Directives in place- pt declines Patient reports she has a scale and is now weighing once weekly with weight remaining the same at 222 pounds, patient does not commit to weighing daily.  Patient can benefit from teaching/ reinforcement HF action plan. Patient left voicemail requesting RN care manager return her call.  Spoke with patient who reports she has been out of Jardiance x 1 week due to not being able to afford, states she has not contacted primary care provider office for samples  Patient states she was seen by primary care provider and sent to ED "few days ago because my heart was out of rhythm" (pt went to ED on 05/28/22) Pt reports she was prescribed Eliquis and is taking as prescribed and states " but it's making me nauseous",also prescribed metoprolol  Patient states she "does not feel like heart racing" , no other symptoms reported, pt states she does not have anything to check heart rate with and can try to check it herself if needed, pt states she is to  follow up with cardiologist this month, (per notes, pt is to follow up with cardiologist at [place] [facility] to determine a more definitive diagnosis- PAF?)  Planned Interventions: Provided education on low sodium diet Assessed need for readable accurate scales in home Provided education about placing scale on hard, flat surface Advised patient to weigh each morning after emptying bladder Discussed importance of daily weight and advised patient to weigh and record daily Discussed the importance of keeping all appointments with provider Provided patient with education about the role of exercise in the management of heart failure Referral made to community resources care guide team for assistance with food insecurity Reinforced HF action plan, importance of calling doctor early on for change in health status, symptoms In basket message sent to primary care provider reporting pt feels Eliquis is making her nauseous, ask if zofran may be appropriate or other suggestions In basket message sent to [facility] pharmacist Allison Rollins reporting pt has been out of jardiance x 1 week and per previous conversation encouraged pt to get jardiance filled at beginning of the year because donut hole reset, ask if has any samples for the interim Reviewed normal parameters for heart rate and encouraged pt to check heart rate daily  Symptom Management: Take medications as prescribed   Attend all scheduled provider appointments Call pharmacy for medication refills 3-7 days in advance of running out of medications Attend church or other social activities Perform all self care  activities independently  Perform IADL's (shopping, preparing meals, housekeeping, managing finances) independently Call provider office for new concerns or questions  Work with the social worker to address care coordination needs and will continue to work with the clinical team to address health care and disease management related needs do ankle  pumps when sitting keep legs up while sitting use salt in moderation watch for swelling in feet, ankles and legs every day weigh myself daily develop a rescue plan follow rescue plan if symptoms flare-up eat more whole grains, fruits and vegetables, lean meats and healthy fats track symptoms and what helps feel better or worse dress right for the weather, hot or cold Follow heart failure action plan- call doctor early on for change in health status, symptoms Continue weighing- please consider weighing daily Get outside and walk if you are able Please get jardiance refilled as donut hole has reset Samples have been requested, please follow up with doctor's office for pickup if they have samples Nausea has been reported- your doctor may call something into pharmacy for this Please check your heart rate daily, let your doctor know if >100 Follow up / stay in touch with your primary care provider's office for symptom management or change in health status Go to ED for unrelieved shortness of breath, if you feel your heart is racing, chest pain  Follow Up Plan: Telephone follow up appointment with care management team member scheduled for:  06/15/22 at 1045 am          Plan:Telephone follow up appointment with care management team member scheduled for:  06/15/22 at [place] am  Allison Rollins [facility], BSN RN Case Manager [place] [phone]

## 2022-06-02 ENCOUNTER — Other Ambulatory Visit: Payer: Self-pay

## 2022-06-02 ENCOUNTER — Other Ambulatory Visit: Payer: Self-pay | Admitting: Family Medicine

## 2022-06-02 ENCOUNTER — Other Ambulatory Visit (HOSPITAL_COMMUNITY): Payer: Self-pay

## 2022-06-02 ENCOUNTER — Encounter (HOSPITAL_COMMUNITY): Payer: Self-pay | Admitting: *Deleted

## 2022-06-02 ENCOUNTER — Emergency Department (HOSPITAL_COMMUNITY): Payer: Medicare HMO

## 2022-06-02 ENCOUNTER — Emergency Department (HOSPITAL_COMMUNITY)
Admission: EM | Admit: 2022-06-02 | Discharge: 2022-06-02 | Disposition: A | Discharge status: Home / Self Care | Payer: Medicare HMO | Attending: Emergency Medicine | Admitting: Emergency Medicine

## 2022-06-02 DIAGNOSIS — I10 Essential (primary) hypertension: Secondary | ICD-10-CM | POA: Diagnosis not present

## 2022-06-02 DIAGNOSIS — G2581 Restless legs syndrome: Secondary | ICD-10-CM

## 2022-06-02 DIAGNOSIS — R609 Edema, unspecified: Secondary | ICD-10-CM | POA: Diagnosis not present

## 2022-06-02 DIAGNOSIS — G8929 Other chronic pain: Secondary | ICD-10-CM

## 2022-06-02 DIAGNOSIS — E876 Hypokalemia: Secondary | ICD-10-CM | POA: Diagnosis not present

## 2022-06-02 DIAGNOSIS — R0789 Other chest pain: Secondary | ICD-10-CM | POA: Insufficient documentation

## 2022-06-02 DIAGNOSIS — Z7901 Long term (current) use of anticoagulants: Secondary | ICD-10-CM | POA: Diagnosis not present

## 2022-06-02 DIAGNOSIS — R110 Nausea: Secondary | ICD-10-CM | POA: Diagnosis not present

## 2022-06-02 DIAGNOSIS — J811 Chronic pulmonary edema: Secondary | ICD-10-CM | POA: Diagnosis not present

## 2022-06-02 DIAGNOSIS — K529 Noninfective gastroenteritis and colitis, unspecified: Secondary | ICD-10-CM | POA: Diagnosis not present

## 2022-06-02 DIAGNOSIS — R531 Weakness: Secondary | ICD-10-CM | POA: Diagnosis not present

## 2022-06-02 DIAGNOSIS — G629 Polyneuropathy, unspecified: Secondary | ICD-10-CM

## 2022-06-02 DIAGNOSIS — I517 Cardiomegaly: Secondary | ICD-10-CM | POA: Diagnosis not present

## 2022-06-02 DIAGNOSIS — Z743 Need for continuous supervision: Secondary | ICD-10-CM | POA: Diagnosis not present

## 2022-06-02 LAB — COMPREHENSIVE METABOLIC PANEL
ALT: 14 U/L (ref 0–44)
AST: 18 U/L (ref 15–41)
Albumin: 3.7 g/dL (ref 3.5–5.0)
Alkaline Phosphatase: 88 U/L (ref 38–126)
Anion gap: 11 (ref 5–15)
BUN: 16 mg/dL (ref 8–23)
CO2: 24 mmol/L (ref 22–32)
Calcium: 8.7 mg/dL — ABNORMAL LOW (ref 8.9–10.3)
Chloride: 103 mmol/L (ref 98–111)
Creatinine, Ser: 0.98 mg/dL (ref 0.44–1.00)
GFR, Estimated: >60 mL/min (ref >60)
Glucose, Bld: 107 mg/dL — ABNORMAL HIGH (ref 70–99)
Potassium: 3.5 mmol/L (ref 3.5–5.1)
Sodium: 138 mmol/L (ref 135–145)
Total Bilirubin: 1.6 mg/dL — ABNORMAL HIGH (ref 0.3–1.2)
Total Protein: 6.6 g/dL (ref 6.5–8.1)

## 2022-06-02 LAB — URINALYSIS, ROUTINE W REFLEX MICROSCOPIC
Bilirubin Urine: NEGATIVE
Glucose, UA: NEGATIVE mg/dL
Hgb urine dipstick: NEGATIVE
Ketones, ur: 80 mg/dL — AB
Leukocytes,Ua: NEGATIVE
Nitrite: NEGATIVE
Protein, ur: 30 mg/dL — AB
Specific Gravity, Urine: 1.023 (ref 1.005–1.030)
pH: 5 (ref 5.0–8.0)

## 2022-06-02 LAB — CBC
HCT: 43.9 % (ref 36.0–46.0)
Hemoglobin: 14.3 g/dL (ref 12.0–15.0)
MCH: 30.2 pg (ref 26.0–34.0)
MCHC: 32.6 g/dL (ref 30.0–36.0)
MCV: 92.8 fL (ref 80.0–100.0)
Platelets: 212 10*3/uL (ref 150–400)
RBC: 4.73 MIL/uL (ref 3.87–5.11)
RDW: 13.7 % (ref 11.5–15.5)
WBC: 8.3 10*3/uL (ref 4.0–10.5)
nRBC: 0 % (ref 0.0–0.2)

## 2022-06-02 LAB — TROPONIN I (HIGH SENSITIVITY)
Troponin I (High Sensitivity): 11 ng/L (ref <18)
Troponin I (High Sensitivity): 11 ng/L (ref <18)

## 2022-06-02 LAB — LIPASE, BLOOD: Lipase: 29 U/L (ref 11–51)

## 2022-06-02 MED ORDER — ONDANSETRON HCL 4 MG/2ML IJ SOLN
4.0000 mg | Freq: Once | INTRAMUSCULAR | Status: AC
  Administered 2022-06-02: 4 mg via INTRAVENOUS
  Filled 2022-06-02: qty 2

## 2022-06-02 MED ORDER — PROCHLORPERAZINE EDISYLATE 10 MG/2ML IJ SOLN
10.0000 mg | Freq: Once | INTRAMUSCULAR | Status: AC
  Administered 2022-06-02: 10 mg via INTRAVENOUS
  Filled 2022-06-02: qty 2

## 2022-06-02 MED ORDER — SODIUM CHLORIDE 0.9 % IV BOLUS
1000.0000 mL | Freq: Once | INTRAVENOUS | Status: AC
  Administered 2022-06-02: 1000 mL via INTRAVENOUS

## 2022-06-02 MED ORDER — METOCLOPRAMIDE HCL 10 MG PO TABS: 10.0000 mg | ORAL_TABLET | Freq: Two times a day (BID) | ORAL | 0 refills | Status: DC

## 2022-06-02 NOTE — ED Provider Notes (Signed)
[facility] Provider Note   CSN: [id number] Arrival date & time: 06/02/22  1201     History Chief Complaint  Patient presents with   Nausea   Chest Pain    Allison Rollins is a 72 y.o. female.  Patient presents emergency department with complaints of nausea and central chest pain.  She reports the symptoms been going on for the last 2 to 3 days that she has been having nausea whenever she eats or drinks anything.  Patient was recently seen in the emergency department several days ago for an episode of atrial fibrillation and was prescribed metoprolol and Eliquis.  Patient has not been taking either medication since discharge.  Reports associated diarrhea.  Denies any headaches, shortness of breath, abdominal pain, urinary symptoms.   Chest Pain Associated symptoms: fatigue   Associated symptoms: no cough, no fever, no headache, no shortness of breath and no weakness        Home Medications Prior to Admission medications   Medication Sig Start Date End Date Taking? Authorizing Provider  acetaminophen (TYLENOL) 500 MG tablet Take 1 tablet (500 mg total) by mouth every 6 (six) hours as needed. 02/16/22  Yes Ivy Lynn, NP  albuterol (PROVENTIL) (2.5 MG/3ML) 0.083% nebulizer solution Take 3 mLs (2.5 mg total) by nebulization every 6 (six) hours as needed for wheezing or shortness of breath. 01/12/18  Yes Rakes, Connye Burkitt, FNP  albuterol (VENTOLIN HFA) 108 (90 Base) MCG/ACT inhaler Inhale 2 puffs into the lungs every 6 (six) hours as needed for wheezing or shortness of breath. 08/18/20  Yes Gwenlyn Perking, FNP  Alcohol Swabs (B-D SINGLE USE SWABS REGULAR) PADS Test sugars daily 05/24/18  Yes Rakes, Connye Burkitt, FNP  ALLERGY RELIEF CETIRIZINE 10 MG tablet Take 1 tablet by mouth every day 01/08/22  Yes Loman Brooklyn, FNP  apixaban (ELIQUIS) 5 MG TABS tablet Take 1 tablet (5 mg total) by mouth 2 (two) times daily. 05/28/22 06/27/22 Yes Blue, Soijett A,  PA-C  Ascorbic Acid (VITAMIN C PO) Take by mouth daily. 3 tablets daily   Yes [provider]  atorvastatin (LIPITOR) 40 MG tablet Take 1 tablet by mouth every day 05/03/22  Yes Gwenlyn Perking, FNP  CALCIUM PO Take by mouth daily.   Yes [provider]  cholecalciferol (VITAMIN D3) 25 MCG (1000 UT) tablet Take 1,000 Units by mouth daily.   Yes [provider]  famotidine (PEPCID) 20 MG tablet Take 1 tablet by mouth at bedtime 05/03/22  Yes Gwenlyn Perking, FNP  fluticasone [facility]) 50 MCG/ACT nasal spray Use 2 sprays into each nostril every day Patient taking differently: Place 2 sprays into both nostrils daily. 05/09/22  Yes Gwenlyn Perking, FNP  furosemide (LASIX) 20 MG tablet Take 1 tablet (20 mg total) by mouth daily as needed. 10/29/21  Yes [name] F, FNP  gabapentin (NEURONTIN) 300 MG capsule Take 2 capsules by mouth 3 times a day 06/02/22  Yes Gwenlyn Perking, FNP  lisinopril (ZESTRIL) 10 MG tablet Take 1 tablet by mouth every day 04/10/22  Yes Gwenlyn Perking, FNP  metoCLOPramide (REGLAN) 10 MG tablet Take 1 tablet (10 mg total) by mouth in the morning and at bedtime for 10 days. 06/02/22 06/12/22 Yes Luvenia Heller, PA-C  metoprolol tartrate (LOPRESSOR) 25 MG tablet Take 1 tablet (25 mg total) by mouth 2 (two) times daily. 05/28/22 06/27/22 Yes Blue, Soijett A, PA-C  pantoprazole (PROTONIX)  40 MG tablet Take 1 tablet by mouth twice daily 04/10/22  Yes Gwenlyn Perking, FNP  Potassium 99 MG TABS Take 1 tablet by mouth daily.   Yes [provider]  pramipexole (MIRAPEX) 0.5 MG tablet Take 1 tablet by mouth at bedtime 06/02/22  Yes Gwenlyn Perking, FNP  sertraline (ZOLOFT) 100 MG tablet Take 2 tablets (200 mg total) by mouth daily. 02/24/22  Yes Gwenlyn Perking, FNP  glucose blood (ACCU-CHEK AVIVA PLUS) test strip Test sugars daily 02/29/20   Dettinger, Fransisca Kaufmann, MD  nitroGLYCERIN (NITROSTAT) 0.4 MG SL tablet Place 1 tablet (0.4 mg total) under the  tongue every 5 (five) minutes as needed for chest pain. Patient not taking: Reported on 06/02/2022 12/25/15   Eustaquio Maize, MD      Allergies    Wilder Glade [dapagliflozin], Penicillins, and Sulfa antibiotics    Review of Systems   Review of Systems  Constitutional:  Positive for fatigue. Negative for chills and fever.  Respiratory:  Negative for cough, chest tightness and shortness of breath.   Cardiovascular:  Positive for chest pain.  Genitourinary:  Negative for dysuria.  Neurological:  Negative for weakness, light-headedness and headaches.  All other systems reviewed and are negative.   Physical Exam Updated Vital Signs BP (!) 144/92   Pulse 66   Temp 98.4 F (36.9 C) (Axillary)   Resp 19   Ht 5' 7"$  (1.702 m)   Wt 102.3 kg   SpO2 93%   BMI 35.32 kg/m  Physical Exam Vitals and nursing note reviewed.  Constitutional:      Appearance: She is well-developed.  HENT:     Head: Normocephalic and atraumatic.  Cardiovascular:     Rate and Rhythm: Normal rate and regular rhythm.     Heart sounds: Normal heart sounds. No murmur heard. Pulmonary:     Effort: Pulmonary effort is normal. No tachypnea.     Breath sounds: Normal breath sounds. No decreased breath sounds, wheezing, rhonchi or rales.  Skin:    General: Skin is warm and dry.     Capillary Refill: Capillary refill takes less than 2 seconds.  Neurological:     General: No focal deficit present.     Mental Status: She is alert.     ED Results / Procedures / Treatments   Labs (all labs ordered are listed, but only abnormal results are displayed) Labs Reviewed  COMPREHENSIVE METABOLIC PANEL - Abnormal; Notable for the following components:      Result Value   Glucose, Bld 107 (*)    Calcium 8.7 (*)    Total Bilirubin 1.6 (*)    All other components within normal limits  URINALYSIS, ROUTINE W REFLEX MICROSCOPIC - Abnormal; Notable for the following components:   APPearance HAZY (*)    Ketones, ur 80 (*)     Protein, ur 30 (*)    Bacteria, UA FEW (*)    All other components within normal limits  LIPASE, BLOOD  CBC  TROPONIN I (HIGH SENSITIVITY)  TROPONIN I (HIGH SENSITIVITY)    EKG EKG Interpretation  Date/Time:  Wednesday June 02 2022 14:27:55 EST Ventricular Rate:  64 PR Interval:  159 QRS Duration: 93 QT Interval:  488 QTC Calculation: 504 R Axis:   76 Text Interpretation: Sinus rhythm Ventricular trigeminy Prolonged QT interval Confirmed by Cindee Lame [phone]) on 06/02/2022 6:58:59 PM  Radiology No results found.  Procedures Procedures   Medications Ordered in ED Medications  sodium chloride 0.9 %  bolus 1,000 mL (0 mLs Intravenous Stopped 06/02/22 1640)  ondansetron (ZOFRAN) injection 4 mg (4 mg Intravenous Given 06/02/22 1419)  sodium chloride 0.9 % bolus 1,000 mL (0 mLs Intravenous Stopped 06/02/22 2053)  prochlorperazine (COMPAZINE) injection 10 mg (10 mg Intravenous Given 06/02/22 1820)    ED Course/ Medical Decision Making/ A&P Clinical Course as of 06/04/22 1734  Wed Jun 02, 2022  1618 EKG 12-Lead [OZ]    Clinical Course User Index [OZ] Luvenia Heller, PA-C                            Medical Decision Making Amount and/or Complexity of Data Reviewed Labs: ordered. ECG/medicine tests:  Decision-making details documented in ED Course.  Risk Prescription drug management.   This patient presents to the ED for concern of nausea and chest pain. Differential diagnosis includes ACS, gastroenteritis, a-fib, syncope, viral URI  Lab Tests:  I Ordered, and personally interpreted labs.  The pertinent results include: Normal CBC, CMP, lipase, negative troponin   Imaging Studies ordered:  I ordered imaging studies including chest x-ray I independently visualized and interpreted imaging which showed stable cardiomegaly I agree with the radiologist interpretation   Medicines ordered and prescription drug management:  I ordered medication including IV fluids and  Zofran for dehydration and nausea Reevaluation of the patient after these medicines showed that the patient improved I have reviewed the patients home medicines and have made adjustments as needed   Problem List / ED Course:  Patient presents emergency department complaints of nausea and chest pain for several days.  Patient reports that she was seen in the emergency department less than 1 week ago for an episode of atrial fibrillation. Patient described chest pain as minor and centralized without radiation into arms or jaw. Workup was largely reassuring and patient's EKG remained stable with periodic PVCs noted, but largely in NSR. Fluid resuscitation and Zofran were performed given patient's report that she has had minimal food intake over the last 3 days due to nausea. Advised patient to continue to practice good hydration at home to reduce the risk of any complications. Attempted p.o. challenge and patient was unable to drink water without an episode of vomiting.  She describes radiating vomiting and has a white phlegm and nonbillious. Patient was repeat dose of IV fluid bolus and Reglan for nausea.  Patient's condition appeared to improve with repeat antiemetic medication and more fluids.  Advised patient to manage symptoms at home with adequate oral rehydration to reduce risk of dehydration and complications.  Patient was started on 2 L via [name] while in the emergency department as this is patient's typical baseline supplemental O2 at home for COPD management. On reassessment, patient appears stabilized and significantly more comfortable with second round of fluids and antiemetic medication.  Advised patient that treatment plan at home would consist of adequate fluid rehydration with oral rehydration solution such as Pedialyte or Gatorade, nausea control with a short course of Reglan that she can pick up at her pharmacy.  Advised patient is to be cautious with reintroducing solid foods this is likely  will cause some level of GI upset.  Also encourage patient to start taking medications for A-fib as prescribed which includes Eliquis and metoprolol to reduce the risk of any complications from the atrial fibrillation.   Final Clinical Impression(s) / ED Diagnoses Final diagnoses:  Nausea  Other chest pain  Gastroenteritis    Rx / DC  Orders ED Discharge Orders          Ordered    metoCLOPramide (REGLAN) 10 MG tablet  2 times daily        06/02/22 2000              Luvenia Heller, PA-C 06/04/22 1734    Milton Ferguson, MD 06/11/22 1100

## 2022-06-02 NOTE — Discharge Instructions (Addendum)
You are seen in the emergency department for nausea and chest pain.  All of your lab work was otherwise reassuring and normal and no specific cause to your chest pain or nausea has been identified.  You should try to continue to manage nausea symptoms at home with nausea medication that I prescribed for you to ensure that you are able to get an adequate amount of food or liquids and to avoid the risk of dehydration.  Please also make sure that you start taking the medications that were prescribed for the atrial fibrillation that you are diagnosed with which includes Eliquis and metoprolol.  These medications help reduce the risk of any complications that could arise from untreated atrial fibrillation.

## 2022-06-02 NOTE — ED Triage Notes (Addendum)
Pt c/o nausea and diarrhea x 3 days along with intermittent mid chest pain since Friday. Unable to eat x 3 days. Denies vomiting.

## 2022-06-02 NOTE — ED Notes (Signed)
This RN assisted patient with dressing. NT assisted patient via Wheelchair to lobby to await for son's arrival. Per son Leory Plowman) he would be here at 2115. Pt in no acute distress. 2 L   continued as she awaits for son. DC papers reviewed with patient and this RN also reviewed with son over phone. She is aware that she has nausea medication at [place]. Bryson Corona Edd Fabian

## 2022-06-02 NOTE — ED Triage Notes (Signed)
Patient brought in via [place] from home. Alert and oriented. Airway patent. Patient c/o generalized fatigue. Patient recently discharged from hospital for A-fib. Patient has not been taking her prescribed medication x1 week and states that she has not been drinking or eating x3 days. Patient has nausea without vomiting. Denies any diarrhea or fevers. Per [facility] patient hypertensive 150/100, Hr 60s, blood sugar 135.

## 2022-06-02 NOTE — ED Notes (Signed)
Pt noted to drop to 85% RA. Pt reports using O2 as needed at home. Placed on 2L [place] with improvement in SaO2 to 94%.

## 2022-06-02 NOTE — ED Notes (Signed)
Pt provided with water and saltine crackers for PO challenge. 

## 2022-06-02 NOTE — Telephone Encounter (Signed)
Mailed [employer] application to patients home. Emailed [facility] Free app (with PCP pages).

## 2022-06-03 NOTE — Telephone Encounter (Signed)
Submitted application for JARDIANCE to [employer] ([employer]) for patient assistance.   Phone: [phone]  

## 2022-06-09 ENCOUNTER — Ambulatory Visit: Payer: Self-pay | Admitting: *Deleted

## 2022-06-09 ENCOUNTER — Encounter: Payer: Self-pay | Admitting: *Deleted

## 2022-06-09 NOTE — Patient Outreach (Signed)
Care Coordination   Follow Up Visit Note   06/09/2022  Name: Allison Rollins MRN: [id number] DOB: April 13, 1951  Allison Rollins is a 72 y.o. year old female who sees Allison Perking, FNP for primary care. I spoke with Allison Rollins by phone today.  What matters to the patients health and wellness today?  Find Help in My Community.    Goals Addressed               This Visit's Progress     Find Help in My Community. (pt-stated)   On track     Care Coordination Interventions:  Interventions Today    Flowsheet Row Most Recent Value  Chronic Disease   Chronic disease during today's visit Hypertension (HTN), Chronic Obstructive Pulmonary Disease (COPD), Other, Chronic Kidney Disease/End Stage Renal Disease (ESRD)  [Parkinson's Disease, Neuropathy, Recurrent Depression & Generalized Anxiety Disorder]  General Interventions   General Interventions Discussed/Reviewed General Interventions Discussed, General Interventions Reviewed, Annual Eye Exam, Labs, Durable Medical Equipment (DME), Vaccines, Health Screening, Community Resources, Doctor Visits, Level of Care  Labs Hgb A1c annually  Vaccines COVID-19, Flu, Pneumonia, RSV, Shingles  Doctor Visits Discussed/Reviewed Doctor Visits Discussed, Doctor Visits Reviewed, Annual Wellness Visits, PCP, Specialist  Health Screening Colonoscopy, Bone Density, Mammogram  Durable Medical Equipment (DME) BP Cuff, Walker, Community education officer  PCP/Specialist Visits Compliance with follow-up visit  Level of Care Applications, Assisted Living, Personal Care Academic librarian, Personal Care Services, Other  [place] Application]  Exercise Interventions   Exercise Discussed/Reviewed Exercise Discussed, Exercise Reviewed, Physical Activity, Assistive device use and maintanence  Physical Activity Discussed/Reviewed Physical Activity Discussed, Physical Activity Reviewed, Types of exercise  Education  Interventions   Education Provided Provided Engineer, site, Provided Education, Provided Web-based Education  Provided Verbal Education On Nutrition, Eye Care, Mental Health/Coping with Illness, Applications, Exercise, Medication, Personal assistant, [employer], When to see the doctor  Applications Medicaid, Personal Care Services, Other  [place] Discussed, Mental Health Reviewed, Coping Strategies, Crisis, Anxiety, Depression  Nutrition Interventions   Nutrition Discussed/Reviewed Nutrition Discussed, Nutrition Reviewed, Fluid intake, Portion sizes, Decreasing sugar intake, Decreasing salt, Decreasing fats, Increaing proteins  Pharmacy Interventions   Pharmacy Dicussed/Reviewed Pharmacy Topics Discussed, Pharmacy Topics Reviewed, Medication Adherence, Affording Medications  Safety Interventions   Safety Discussed/Reviewed Safety Discussed, Safety Reviewed, Fall Risk  Advanced Directive Interventions   Advanced Directives Discussed/Reviewed Advanced Directives Discussed, Advanced Directives Reviewed     Active Listening & Reflection Utilized.  Verbalization of Feelings Encouraged.  Emotional Support Provided. Feelings of Frustration Validated, Regarding Financial Insecurity. Problem Solving Interventions Enacted. Solution-Focused Strategies Employed. Task-Centered Solutions Implemented. Acceptance & Commitment Therapy Initiated. Cognitive Behavioral Therapy Engaged. Client Centered Therapy Performed. Continue Contacting The Following List of [facility], In An Effort to [place]: ~ [employer] [phone]# [phone]) ~ [employer] [phone]) ~ [place] [phone] (# [phone]) ~ [place] [phone]# [phone]) ~ [place] [phone]) ~ [facility] [phone]#  [phone]) ~ [employer] (# [phone]) ~ [place] (# [phone]) ~ [place] (# [phone]) ~ [place] [phone]) ~ Aging, [place] of [place] [phone]) Please Review [facility] Financial Assistance Application, Mailed on Q000111Q & Be Prepared to Complete with Assistance of CSW, During Initial Home Visit, Scheduled on 06/25/2022 at 9:00 AM.  Please Review Medicaid Application, Mailed on Q000111Q & Be Prepared to Complete with Assistance of CSW, During Initial Home Visit, Scheduled on 06/25/2022 at 9:00 AM. Please Review Personal Care Services Application, Mailed on Q000111Q & Be Prepared to Complete with Assistance of CSW, During Initial Home Visit, Scheduled on 06/25/2022 at 9:00 AM. Continue to Receive $180.00 Per Month Grocery Allowance, As A Benefit of Aetna Medicare.   Continue to Receive $105.00 Per Month Over-The-Counter Allowance, As A Benefit of Aetna Medicare. Please Contact CSW Directly [phone]# [phone]), if You Have Questions, Need Assistance with Outreaching to [place], or If Additional Social Work Needs Are Identified Between Now & Scheduled Initial Home Visit.      SDOH assessments and interventions completed:  Yes.  Care Coordination Interventions:  Yes, provided.   Follow up plan: Follow up call scheduled for 06/25/2022 at 9:00 am.  Encounter Outcome:  Pt. Visit Completed.   Nat Christen, BSW, MSW, LCSW  Licensed Education officer, [facility]  Mailing [place] [address], [place], [place] [address] Physical Address-[address] [place], [place] [address] Toll Free Main # [phone] Fax # [phone] Cell # [phone] [email or web address]

## 2022-06-09 NOTE — Patient Instructions (Signed)
Visit Information  Thank you for taking time to visit with me today. Please don't hesitate to contact me if I can be of assistance to you.   Following are the goals we discussed today:   Goals Addressed               This Visit's Progress     Find Help in My Community. (pt-stated)   On track     Care Coordination Interventions:  Interventions Today    Flowsheet Row Most Recent Value  Chronic Disease   Chronic disease during today's visit Hypertension (HTN), Chronic Obstructive Pulmonary Disease (COPD), Other, Chronic Kidney Disease/End Stage Renal Disease (ESRD)  [Parkinson's Disease, Neuropathy, Recurrent Depression & Generalized Anxiety Disorder]  General Interventions   General Interventions Discussed/Reviewed General Interventions Discussed, General Interventions Reviewed, Annual Eye Exam, Labs, Durable Medical Equipment (DME), Vaccines, Health Screening, Community Resources, Doctor Visits, Level of Care  Labs Hgb A1c annually  Vaccines COVID-19, Flu, Pneumonia, RSV, Shingles  Doctor Visits Discussed/Reviewed Doctor Visits Discussed, Doctor Visits Reviewed, Annual Wellness Visits, PCP, Specialist  Health Screening Colonoscopy, Bone Density, Mammogram  Durable Medical Equipment (DME) BP Cuff, Walker, Community education officer  PCP/Specialist Visits Compliance with follow-up visit  Level of Care Applications, Assisted Living, Personal Care Academic librarian, Personal Care Services, Other  [place] Application]  Exercise Interventions   Exercise Discussed/Reviewed Exercise Discussed, Exercise Reviewed, Physical Activity, Assistive device use and maintanence  Physical Activity Discussed/Reviewed Physical Activity Discussed, Physical Activity Reviewed, Types of exercise  Education Interventions   Education Provided Provided Engineer, site, Provided Education, Provided Web-based Education  Provided Verbal Education On Nutrition, Eye  Care, Mental Health/Coping with Illness, Applications, Exercise, Medication, Personal assistant, [employer], When to see the doctor  Applications Medicaid, Personal Care Services, Other  [place] Discussed, Mental Health Reviewed, Coping Strategies, Crisis, Anxiety, Depression  Nutrition Interventions   Nutrition Discussed/Reviewed Nutrition Discussed, Nutrition Reviewed, Fluid intake, Portion sizes, Decreasing sugar intake, Decreasing salt, Decreasing fats, Increaing proteins  Pharmacy Interventions   Pharmacy Dicussed/Reviewed Pharmacy Topics Discussed, Pharmacy Topics Reviewed, Medication Adherence, Affording Medications  Safety Interventions   Safety Discussed/Reviewed Safety Discussed, Safety Reviewed, Fall Risk  Advanced Directive Interventions   Advanced Directives Discussed/Reviewed Advanced Directives Discussed, Advanced Directives Reviewed     Active Listening & Reflection Utilized.  Verbalization of Feelings Encouraged.  Emotional Support Provided. Feelings of Frustration Validated, Regarding Financial Insecurity. Problem Solving Interventions Enacted. Solution-Focused Strategies Employed. Task-Centered Solutions Implemented. Acceptance & Commitment Therapy Initiated. Cognitive Behavioral Therapy Engaged. Client Centered Therapy Performed. Continue Contacting The Following List of [facility], In An Effort to [place]: ~ [employer] [phone]# [phone]) ~ [employer] [phone]) ~ [place] [phone] (# [phone]) ~ [place] [phone]# [phone]) ~ [place] [phone]) ~ [facility] [phone]# [phone]) ~ [employer] (# [phone]) ~ [place] (# [phone]) ~ [place] (# [phone]) ~ [place] [phone]) ~  Aging, [place] of [place] [phone]) Please Review [facility] Financial Assistance Application, Mailed on Q000111Q & Be Prepared to Complete with Assistance of CSW, During Initial Home Visit, Scheduled on 06/25/2022 at 9:00 AM. Please Review Medicaid Application, Mailed on Q000111Q & Be Prepared to Complete with Assistance of CSW, During Initial Home Visit, Scheduled on 06/25/2022 at 9:00 AM. Please Review Personal Care  Services Application, Mailed on Q000111Q & Be Prepared to Complete with Assistance of CSW, During Initial Home Visit, Scheduled on 06/25/2022 at 9:00 AM. Continue to Receive $180.00 Per Month Grocery Allowance, As A Benefit of Aetna Medicare.   Continue to Receive $105.00 Per Month Over-The-Counter Allowance, As A Benefit of Aetna Medicare. Please Contact CSW Directly [phone]# [phone]), if You Have Questions, Need Assistance with Outreaching to [place], or If Additional Social Work Needs Are Identified Between Now & Scheduled Initial Home Visit.      Our next appointment is by telephone on 06/25/2022 at 9:00 am.  Please call the care guide team at [phone] if you need to cancel or reschedule your appointment.   If you are experiencing a Mental Health or [place] or need someone to talk to, please call the Suicide and Crisis Lifeline: 988 call the [place] National Suicide Prevention Lifeline: 423-149-8426 or TTY: 5086310849 TTY 919 306 2676) to talk to a trained counselor call 1-800-273-TALK (toll free, 24 hour hotline) go to [facility] [address] [place] [phone]) call the [place]: [phone] call 911  Patient verbalizes understanding of instructions and care plan provided today and agrees to view in [place]. Active MyChart status and patient understanding of how to access instructions and care plan via  MyChart confirmed with patient.     Telephone follow up appointment with care management team member scheduled for:  06/25/2022 at 9:00 am.  Nat Christen, BSW, MSW, [place]  Licensed Clinical Social Worker  [place]  Mailing [place]. [address] [place], [place] [address] Physical Address-[address], [place], [place] [address] Toll Free Main # [phone] Fax # [phone] Cell # [phone] [email or web address]

## 2022-06-11 ENCOUNTER — Ambulatory Visit: Payer: Medicare HMO

## 2022-06-15 ENCOUNTER — Telehealth: Payer: Self-pay | Admitting: *Deleted

## 2022-06-15 ENCOUNTER — Telehealth: Payer: Medicare Other

## 2022-06-15 NOTE — Telephone Encounter (Signed)
   CCM RN Visit Note   06/15/22 Name: Allison Rollins MRN: [id number]      DOB: October 03, 1950  Subjective: Allison Rollins is a 72 y.o. year old female who is a primary care patient of Marjorie Smolder FNP The patient was referred to the Chronic Care Management team for assistance with care management needs subsequent to provider initiation of CCM services and plan of care.      An unsuccessful telephone outreach was attempted today to contact the patient about Chronic Care Management needs.    Plan:Telephone follow up appointment with care management team member scheduled for:  upon care guide rescheduling.  Jacqlyn Larsen RNC, BSN RN Case Manager [place] [phone]

## 2022-06-16 ENCOUNTER — Ambulatory Visit (HOSPITAL_COMMUNITY)
Admission: RE | Admit: 2022-06-16 | Discharge: 2022-06-16 | Disposition: A | Discharge status: Home / Self Care | Payer: Medicare HMO | Source: Ambulatory Visit | Attending: Nurse Practitioner | Admitting: Nurse Practitioner

## 2022-06-16 ENCOUNTER — Encounter (HOSPITAL_COMMUNITY): Payer: Self-pay | Admitting: Nurse Practitioner

## 2022-06-16 VITALS — BP 144/96 | HR 73 | Ht 67.0 in | Wt 210.4 lb

## 2022-06-16 DIAGNOSIS — I4891 Unspecified atrial fibrillation: Secondary | ICD-10-CM

## 2022-06-16 DIAGNOSIS — I509 Heart failure, unspecified: Secondary | ICD-10-CM | POA: Diagnosis not present

## 2022-06-16 DIAGNOSIS — Z7901 Long term (current) use of anticoagulants: Secondary | ICD-10-CM | POA: Insufficient documentation

## 2022-06-16 DIAGNOSIS — Z79899 Other long term (current) drug therapy: Secondary | ICD-10-CM | POA: Diagnosis not present

## 2022-06-16 DIAGNOSIS — J449 Chronic obstructive pulmonary disease, unspecified: Secondary | ICD-10-CM | POA: Insufficient documentation

## 2022-06-16 DIAGNOSIS — I110 Hypertensive heart disease with heart failure: Secondary | ICD-10-CM | POA: Insufficient documentation

## 2022-06-16 DIAGNOSIS — D6869 Other thrombophilia: Secondary | ICD-10-CM

## 2022-06-16 MED ORDER — APIXABAN 5 MG PO TABS: 5.0000 mg | ORAL_TABLET | Freq: Two times a day (BID) | ORAL | 4 refills | Status: DC

## 2022-06-16 MED ORDER — METOPROLOL TARTRATE 25 MG PO TABS: 25.0000 mg | ORAL_TABLET | Freq: Two times a day (BID) | ORAL | 4 refills | Status: DC

## 2022-06-16 NOTE — Progress Notes (Signed)
Primary Care Physician: Gwenlyn Perking, FNP Referring Physician:ED f/u Cardiologist: Dr. Connye Burkitt is a 72 y.o. female with a h/o  CHF, hypertension, CKD, COPD who presented to the ED with concerns for Afib. Pt notes that her doctor told her that she had Afib which she has known about for the past 5 months and PCP has been monitoring it. Documented afib noted on EKG of 05/28/22.  However, during visit that day with PCP,  was told to come into the ED due to concerns for Afib. EKG showed afib at  132 bpm. She also presented to the ED again on 2/7 with nausea and vomiting. Improved with hydration and meds. She was in SR then. She reported that she had not started the metoprolol and eliquis.   She is now in the afib clinic as ED f/u. She is in SR. She is taking metoprolol and eliquis. She states that nausea has improved but by recorded weights in EPic her weight is down 15 lbs over the last severl weeks. She had a PCP appointment for last Friday and forgot about it. She feels good today and has not had any further N/V.    Today, she denies symptoms of palpitations, chest pain, shortness of breath, orthopnea, PND, lower extremity edema, dizziness, presyncope, syncope, or neurologic sequela. The patient is tolerating medications without difficulties and is otherwise without complaint today.   Past Medical History:  Diagnosis Date   Anxiety    Asthma    Chest pain 12/22/2015   CHF (congestive heart failure) (HCC)    COPD (chronic obstructive pulmonary disease) (HCC)    Depression    Headache    HTN (hypertension)    Hypokalemia 07/28/2013   Kidney stones    Osteopenia 01/22/2020   Parkinson's disease    Pneumonia    Pre-diabetes    S/P colonoscopy 10/15/04   normal   Vitamin D deficiency 11/24/2018   Past Surgical History:  Procedure Laterality Date   CHOLECYSTECTOMY     COLONOSCOPY  10/15/2004   Normal rectum/Normal colon   COLONOSCOPY N/A 05/10/2016   Procedure:  COLONOSCOPY;  Surgeon: Danie Binder, MD;  Location: [facility];  Service: Endoscopy;  Laterality: N/A;  10:30   ESOPHAGEAL MANOMETRY N/A 04/02/2019   Procedure: ESOPHAGEAL MANOMETRY (EM);  Surgeon: Mauri Pole, MD;  Location: [facility];  Service: Endoscopy;  Laterality: N/A;   ESOPHAGOGASTRODUODENOSCOPY  09/11/2010   Dysphagia likely multifactorial (possible Candida esophagitis, likely nonspecific esophageal motility disorder, and/or uncontrolled gastroesophageal reflux disease), status post empiric dilation   ESOPHAGOGASTRODUODENOSCOPY N/A 11/13/2018   Dr. Oneida Alar: No endoscopic esophageal abnormality to explain patient's dysphagia.  Esophagus dilated.  Moderate erosive/nodular gastritis with benign biopsies.   KIDNEY STONE SURGERY     SAVORY DILATION N/A 11/13/2018   Procedure: SAVORY DILATION;  Surgeon: Danie Binder, MD;  Location: [facility];  Service: Endoscopy;  Laterality: N/A;    Current Outpatient Medications  Medication Sig Dispense Refill   acetaminophen (TYLENOL) 500 MG tablet Take 1 tablet (500 mg total) by mouth every 6 (six) hours as needed. 30 tablet 0   albuterol (PROVENTIL) (2.5 MG/3ML) 0.083% nebulizer solution Take 3 mLs (2.5 mg total) by nebulization every 6 (six) hours as needed for wheezing or shortness of breath. 150 mL 1   albuterol (VENTOLIN HFA) 108 (90 Base) MCG/ACT inhaler Inhale 2 puffs into the lungs every 6 (six) hours as needed for wheezing or shortness of breath.  1 each 0   Alcohol Swabs (B-D SINGLE USE SWABS REGULAR) PADS Test sugars daily 100 each 3   ALLERGY RELIEF CETIRIZINE 10 MG tablet Take 1 tablet by mouth every day 30 tablet 11   apixaban (ELIQUIS) 5 MG TABS tablet Take 1 tablet (5 mg total) by mouth 2 (two) times daily. 60 tablet 0   atorvastatin (LIPITOR) 40 MG tablet Take 1 tablet by mouth every day 30 tablet 0   CALCIUM PO Take by mouth daily.     cholecalciferol (VITAMIN D3) 25 MCG (1000 UT) tablet Take 1,000 Units by mouth  daily.     famotidine (PEPCID) 20 MG tablet Take 1 tablet by mouth at bedtime 30 tablet 0   fluticasone (FLONASE) 50 MCG/ACT nasal spray Use 2 sprays into each nostril every day (Patient taking differently: Place 2 sprays into both nostrils daily.) 16 g 5   furosemide (LASIX) 20 MG tablet Take 1 tablet (20 mg total) by mouth daily as needed. 90 tablet 1   glucose blood (ACCU-CHEK AVIVA PLUS) test strip Test sugars daily 100 each 3   lisinopril (ZESTRIL) 10 MG tablet Take 1 tablet by mouth every day 30 tablet 4   metoprolol tartrate (LOPRESSOR) 25 MG tablet Take 1 tablet (25 mg total) by mouth 2 (two) times daily. 60 tablet 0   nitroGLYCERIN (NITROSTAT) 0.4 MG SL tablet Place 1 tablet (0.4 mg total) under the tongue every 5 (five) minutes as needed for chest pain. 20 tablet 3   pantoprazole (PROTONIX) 40 MG tablet Take 1 tablet by mouth twice daily 60 tablet 4   Potassium 99 MG TABS Take 1 tablet by mouth daily.     pramipexole (MIRAPEX) 0.5 MG tablet Take 1 tablet by mouth at bedtime 30 tablet 2   sertraline (ZOLOFT) 100 MG tablet Take 2 tablets (200 mg total) by mouth daily. 180 tablet 3   Ascorbic Acid (VITAMIN C PO) Take by mouth daily. 3 tablets daily (Patient not taking: Reported on 06/16/2022)     gabapentin (NEURONTIN) 300 MG capsule Take 2 capsules by mouth 3 times a day (Patient not taking: Reported on 06/16/2022) 180 capsule 2   No current facility-administered medications for this encounter.    Allergies  Allergen Reactions   Farxiga [Dapagliflozin] Swelling   Penicillins Itching and Rash    Has patient had a PCN reaction causing immediate rash, facial/tongue/throat swelling, SOB or lightheadedness with hypotension: no Has patient had a PCN reaction causing severe rash involving mucus membranes or skin necrosis: no Has patient had a PCN reaction that required hospitalization: no Has patient had a PCN reaction occurring within the last 10 years: no If all of the above answers are  "NO", then may proceed with Cephalosporin use.    Sulfa Antibiotics Rash    Social History   Socioeconomic History   Marital status: Widowed    Spouse name: Not on file   Number of children: 2   Years of education: 74   Highest education level: 12th grade  Occupational History   Occupation: disabled  Tobacco Use   Smoking status: Former    Packs/day: 0.25    Years: 30.00    Total pack years: 7.50    Types: Cigarettes    Start date: 01/04/1965    Quit date: 01/03/1995    Years since quitting: 27.4    Passive exposure: Past   Smokeless tobacco: Never  Vaping Use   Vaping Use: Never used  Substance and Sexual Activity  Alcohol use: No    Alcohol/week: 0.0 standard drinks of alcohol   Drug use: No   Sexual activity: Not Currently    Partners: Male  Other Topics Concern   Not on file  Social History Narrative   Lives alone. No stairs. Has ramps. Handicap bathroom   Social Determinants of Health   Financial Resource Strain: Medium Risk (04/16/2022)   Overall Financial Resource Strain (CARDIA)    Difficulty of Paying Living Expenses: Somewhat hard  Food Insecurity: No Food Insecurity (04/16/2022)   Hunger Vital Sign    Worried About Running Out of Food in the Last Year: Never true    Ran Out of Food in the Last Year: Never true  Recent Concern: Food Insecurity - Food Insecurity Present (02/24/2022)   Hunger Vital Sign    Worried About Running Out of Food in the Last Year: Often true    Ran Out of Food in the Last Year: Often true  Transportation Needs: No Transportation Needs (04/16/2022)   PRAPARE - Hydrologist (Medical): No    Lack of Transportation (Non-Medical): No  Physical Activity: Insufficiently Active (04/16/2022)   Exercise Vital Sign    Days of Exercise per Week: 3 days    Minutes of Exercise per Session: 30 min  Stress: No Stress Concern Present (04/16/2022)   [place]    Feeling of Stress : Not at all  Social Connections: Moderately Integrated (04/16/2022)   Social Connection and Isolation Panel [NHANES]    Frequency of Communication with Friends and Family: More than three times a week    Frequency of Social Gatherings with Friends and Family: More than three times a week    Attends Religious Services: More than 4 times per year    Active Member of [employer] or Organizations: Yes    Attends Archivist Meetings: More than 4 times per year    Marital Status: Widowed  Recent Concern: Social Connections - Socially Isolated (03/24/2022)   Social Connection and Isolation Panel [NHANES]    Frequency of Communication with Friends and Family: More than three times a week    Frequency of Social Gatherings with Friends and Family: More than three times a week    Attends Religious Services: Never    Marine scientist or Organizations: No    Attends Archivist Meetings: Never    Marital Status: Widowed  Intimate Partner Violence: Not At Risk (04/16/2022)   Humiliation, Afraid, Rape, and Kick questionnaire    Fear of Current or Ex-Partner: No    Emotionally Abused: No    Physically Abused: No    Sexually Abused: No    Family History  Problem Relation Age of Onset   Coronary artery disease Father    COPD Mother    Asthma Brother    Coronary artery disease Sister    Diabetes Sister    Coronary artery disease Brother    Coronary artery disease Sister     ROS- All systems are reviewed and negative except as per the HPI above  Physical Exam: Vitals:   06/16/22 1129  BP: (!) 144/96  Pulse: 73  Weight: 95.4 kg  Height: 5' 7"$  (1.702 m)   Wt Readings from Last 3 Encounters:  06/16/22 95.4 kg  06/02/22 102.3 kg  05/28/22 102.3 kg    Labs: Lab Results  Component Value Date   NA 138 06/02/2022   K  3.5 06/02/2022   CL 103 06/02/2022   CO2 24 06/02/2022   GLUCOSE 107 (H) 06/02/2022   BUN 16 06/02/2022    CREATININE 0.98 06/02/2022   CALCIUM 8.7 (L) 06/02/2022   PHOS 3.0 07/30/2013   MG 1.9 07/17/2019   No results found for: "INR" Lab Results  Component Value Date   CHOL 158 02/24/2022   HDL 39 (L) 02/24/2022   LDLCALC 85 02/24/2022   TRIG 203 (H) 02/24/2022     GEN- The patient is well appearing, alert and oriented x 3 today.   Head- normocephalic, atraumatic Eyes-  Sclera clear, conjunctiva pink Ears- hearing intact Oropharynx- clear Neck- supple, no JVP Lymph- no cervical lymphadenopathy Lungs- Clear to ausculation bilaterally, normal work of breathing Heart- Regular rate and rhythm, no murmurs, rubs or gallops, PMI not laterally displaced GI- soft, NT, ND, + BS Extremities- no clubbing, cyanosis, or edema MS- no significant deformity or atrophy Skin- no rash or lesion Psych- euthymic mood, full affect Neuro- strength and sensation are intact  EKG-Vent. rate 73 BPM PR interval 156 ms QRS duration 86 ms QT/QTcB 452/497 ms P-R-T axes 46 94 55 Sinus rhythm with frequent Premature ventricular complexes Rightward axis Possible Anterior infarct , age undetermined Abnormal ECG When compared with ECG of 02-Jun-2022 14:27, PREVIOUS ECG IS PRESENT    Assessment and Plan:  1. New onset afib Now back in SR General education re afib and triggers discussed  Continue metoprolol 25 mg bid   2. CHA2DS2VASc score of at least 4 Continue eliquis 5 mg bid Bleeding precautions discussed   3. HTN [place]  4. N/V weight loss If symptoms continue f/u with PCP   I will request a f/u with Dr. Harl Bowie in [place] in one month She lives in [place] and coming to [place] is a stretch for her   Geroge Baseman. Jaecob Lowden, [facility] [address] [place], [place] [address] [phone]

## 2022-06-23 ENCOUNTER — Telehealth: Payer: Self-pay | Admitting: Family Medicine

## 2022-06-23 NOTE — Telephone Encounter (Signed)
   Please call patient to get this appointment rescheduled with Almyra Free.

## 2022-06-24 ENCOUNTER — Encounter: Payer: Self-pay | Admitting: *Deleted

## 2022-06-24 ENCOUNTER — Ambulatory Visit: Payer: Self-pay | Admitting: *Deleted

## 2022-06-24 DIAGNOSIS — I10 Essential (primary) hypertension: Secondary | ICD-10-CM

## 2022-06-24 DIAGNOSIS — N1831 Chronic kidney disease, stage 3a: Secondary | ICD-10-CM

## 2022-06-24 DIAGNOSIS — I5032 Chronic diastolic (congestive) heart failure: Secondary | ICD-10-CM

## 2022-06-24 DIAGNOSIS — I4891 Unspecified atrial fibrillation: Secondary | ICD-10-CM

## 2022-06-24 NOTE — Patient Instructions (Addendum)
Please call the care guide team at [phone] if you need to cancel or reschedule your appointment.   If you are experiencing a Mental Health or [place] or need someone to talk to, please call the Suicide and Crisis Lifeline: 988 call the [place] National Suicide Prevention Lifeline: 425-803-6658 or TTY: 720-068-7587 TTY (785)736-2389) to talk to a trained counselor call 1-800-273-TALK (toll free, 24 hour hotline) go to [facility] [address] [place] [phone]) call the [facility]: [phone] call 911   Following is a copy of the CCM Program Consent:  CCM service includes personalized support from designated clinical staff supervised by the physician, including individualized plan of care and coordination with other care providers 24/7 contact phone numbers for assistance for urgent and routine care needs. Service will only be billed when office clinical staff spend 20 minutes or more in a month to coordinate care. Only one practitioner may furnish and bill the service in a calendar month. The patient may stop CCM services at amy time (effective at the end of the month) by phone call to the office staff. The patient will be responsible for cost sharing (co-pay) or up to 20% of the service fee (after annual deductible is met)  Following is a copy of your full provider care plan:   Goals Addressed             This Visit's Progress    CCM (ATRIAL FIBRILLATION) EXPECTED OUTCOME: MONITOR, SELF-MANAGE AND REDUCE SYMPTOMS OF ATRIAL FIBRILLATION       Current Barriers:  Knowledge Deficits related to Atrial Fibrillation management Chronic Disease Management support and education needs related to Atrial Fibrillation Patient states recently diagnosed with Atrial Fibrillation, followed up at [facility]-Fib clinic on 06/16/22, pt reports she is taking blood thinner as prescribed  Planned Interventions: Provider order and  care plan reviewed. Collaborated with PharmD regarding patient care and plan. Counseled on increased risk of stroke due to Afib and benefits of anticoagulation for stroke prevention           Reviewed importance of adherence to anticoagulant exactly as prescribed Counseled on importance of regular laboratory monitoring as prescribed Afib action plan reviewed Reviewed upcoming scheduled appointments  Symptom Management: Take medications as prescribed   Attend all scheduled provider appointments Call pharmacy for medication refills 3-7 days in advance of running out of medications Attend church or other social activities Perform all self care activities independently  Perform IADL's (shopping, preparing meals, housekeeping, managing finances) independently Call provider office for new concerns or questions  Work with the social worker to address care coordination needs and will continue to work with the clinical team to address health care and disease management related needs - check pulse (heart) rate before taking medicine - check pulse (heart) rate once a day - make a plan to exercise regularly - make a plan to eat healthy - keep all lab appointments - take medicine as prescribed - wear medical alert identification Follow Atial Fibrillation action plan- copy mailed to you Social worker will see you for home visit 06/25/22 at 9 am  Follow Up Plan: Telephone follow up appointment with care management team member scheduled for:  07/20/22 at 3 pm       CCM (CHRONIC KIDNEY DISEASE) EXPECTED OUTCOME: MONITOR, SELF-MANAGE AND REDUCE SYMPTOMS OF CHRONIC KIDNEY DISEASE       Current Barriers:  Knowledge Deficits related to Chronic Kidney Disease Chronic Disease Management support and education  needs related to management of Chronic Kidney Disease No Advanced Directives in place- pt declines Patient reports she tries to eat as healthy as she can, does do some light exercise when she feels able,  is now checking blood pressure daily and weighing daily Patient reports she is no longer taking jardiance  Planned Interventions: Evaluation of current treatment plan related to chronic kidney disease self management and patient's adherence to plan as established by provider      Provided education to patient re: stroke prevention, s/s of heart attack and stroke    Reviewed prescribed diet heart healthy, low sodium Reviewed medications with patient and discussed importance of compliance    Counseled on the importance of exercise goals with target of 150 minutes per week     Advised patient, providing education and rationale, to monitor blood pressure daily and record, calling PCP for findings outside established parameters    Discussed complications of poorly controlled blood pressure such as heart disease, stroke, circulatory complications, vision complications, kidney impairment, sexual dysfunction    Reinforced importance of taking all medications as prescribed Reinforced with pt to call into primary care provider office if she is low on any medication and cannot afford refill  Symptom Management: Take medications as prescribed   Attend all scheduled provider appointments Call pharmacy for medication refills 3-7 days in advance of running out of medications Attend church or other social activities Perform all self care activities independently  Perform IADL's (shopping, preparing meals, housekeeping, managing finances) independently Call provider office for new concerns or questions  It is important to keep your blood pressure under good control Choose water as your main beverage Try to walk some everyday- get outside weather permitting  Follow Up Plan: Telephone follow up appointment with care management team member scheduled for:  07/20/22 at 3 pm       CCM ([place]) EXPECTED OUTCOME: MONITOR, SELF-MANAGE AND REDUCE SYMPTOMS OF CONGESTIVE HEART FAILURE       Current  Barriers:  Knowledge Deficits related to Congestive Heart Failure Care Coordination needs related to depression in a patient with Congestive Heart Failure Chronic Disease Management support and education needs related to management of Congestive Heart Failure and diet No Advanced Directives in place- pt declines Patient reports she has a scale and is now weighing once daily, states [employer] is sending her a new scale, pt states she has lost approximately 15-20 pounds. Patient can benefit from teaching/ reinforcement HF action plan Patient reports she has been to ED several times over past few months for varying reasons (dehydration, heart out of rhythm)   Planned Interventions: Provided education on low sodium diet Provided education about placing scale on hard, flat surface Advised patient to weigh each morning after emptying bladder Discussed importance of daily weight and advised patient to weigh and record daily Reviewed role of diuretics in prevention of fluid overload and management of heart failure Discussed the importance of keeping all appointments with provider Provided patient with education about the role of exercise in the management of heart failure Reviewed HF action plan, importance of calling doctor early on for change in health status, symptoms Reinforced normal parameters for heart rate and encouraged pt to check heart rate daily Encouraged pt to call primary care provider for change in health status/ symptom management  Symptom Management: Take medications as prescribed   Attend all scheduled provider appointments Call pharmacy for medication refills 3-7 days in advance of running out of medications Attend  church or other social activities Perform all self care activities independently  Perform IADL's (shopping, preparing meals, housekeeping, managing finances) independently Call provider office for new concerns or questions  Work with the social worker to  address care coordination needs and will continue to work with the clinical team to address health care and disease management related needs call office if I gain more than 2 pounds in one day or 5 pounds in one week do ankle pumps when sitting keep legs up while sitting track weight in diary use salt in moderation watch for swelling in feet, ankles and legs every day weigh myself daily develop a rescue plan follow rescue plan if symptoms flare-up eat more whole grains, fruits and vegetables, lean meats and healthy fats track symptoms and what helps feel better or worse dress right for the weather, hot or cold Follow heart failure action plan- call doctor early on for change in health status, symptoms Continue weighing daily and keeping a log- keep up the good work Get outside and walk if you are able Follow up / stay in touch with your primary care provider's office for symptom management or change in health status Go to ED for unrelieved shortness of breath, if you feel your heart is racing, chest pain  Follow Up Plan: Telephone follow up appointment with care management team member scheduled for:  07/20/22 at 3 pm       CCM (HYPERTENSION) EXPECTED OUTCOME: MONITOR, SELF-MANAGE AND REDUCE SYMPTOMS OF HYPERTENSION       Current Barriers:  Knowledge Deficits related to Hypertension Chronic Disease Management support and education needs related to management of Hypertension and diet No Advanced Directives in place- patient declines Patient reports she lives alone, has adult son nearby and another relative than can assist her if needed, pt drives and is overall independent, states she does not like to go up steps, takes extra time,  pt reports she would like LCSW to outreach her for depression management PHQ9=7 and for interpersonal family relationship difficulties, pt states she gets 23$ per month food stamps and does not always have enough money to buy food Patient reports she is checking  blood pressure daily  Patient verbalizes understanding of LCSW visit tomorrow on 06/25/22 at 9 am  Planned Interventions: Evaluation of current treatment plan related to hypertension self management and patient's adherence to plan as established by provider;   Reviewed prescribed diet low sodium Reviewed medications with patient and discussed importance of compliance;  Counseled on the importance of exercise goals with target of 150 minutes per week Discussed plans with patient for ongoing care management follow up and provided patient with direct contact information for care management team; Advised patient, providing education and rationale, to monitor blood pressure daily and record, calling PCP for findings outside established parameters;  Discussed complications of poorly controlled blood pressure such as heart disease, stroke, circulatory complications, vision complications, kidney impairment, sexual dysfunction;   Symptom Management: Take medications as prescribed   Attend all scheduled provider appointments Call pharmacy for medication refills 3-7 days in advance of running out of medications Attend church or other social activities Perform all self care activities independently  Perform IADL's (shopping, preparing meals, housekeeping, managing finances) independently Call provider office for new concerns or questions  check blood pressure 3 times per week choose a place to take my blood pressure (home, clinic or office, retail store) write blood pressure results in a log or diary keep a blood pressure log take blood  pressure log to all doctor appointments take medications for blood pressure exactly as prescribed report new symptoms to your doctor eat more whole grains, fruits and vegetables, lean meats and healthy fats Follow low sodium diet- read food labels for sodium content  Follow Up Plan: Telephone follow up appointment with care management team member scheduled for:   07/20/22 at 3 pm          The patient verbalized understanding of instructions, educational materials, and care plan provided today and agreed to receive a mailed copy of patient instructions, educational materials, and care plan.     Telephone follow up appointment with care management team member scheduled for:  07/20/22 at 3 pm  Atrial Fibrillation Atrial fibrillation (AFib) is a type of heartbeat that is irregular or fast. If you have AFib, your heart beats without any order. This makes it hard for your heart to pump blood in a normal way. AFib may come and go, or it may become a long-lasting problem. If AFib is not treated, it can put you at higher risk for stroke, heart failure, and other heart problems. What are the causes? AFib may be caused by diseases that damage the heart's electrical system. They include: High blood pressure. Heart failure. Heart valve diseases. Heart surgery. Diabetes. Thyroid disease. Kidney disease. Lung diseases, such as pneumonia or COPD. Sleep apnea. Sometimes the cause is not known. What increases the risk? You are more likely to develop AFib if: You are older. You exercise often and very hard. You have a family history of AFib. You are female. You are Caucasian. You are overweight. You smoke. You drink a lot of alcohol. What are the signs or symptoms? Common symptoms of this condition include: A feeling that your heart is beating very fast. Chest pain or discomfort. Feeling short of breath. Suddenly feeling light-headed or weak. Getting tired easily during activity. Fainting. Sweating. In some cases, there are no symptoms. How is this treated? Medicines to: Prevent blood clots. Treat heart rate or heart rhythm problems. Using devices, such as a pacemaker, to correct heart rhythm problems. Doing surgery to remove the part of the heart that sends bad signals. Closing an area where clots can form in the heart (left atrial  appendage). In some cases, your doctor will treat other underlying conditions. Follow these instructions at home: Medicines Take over-the-counter and prescription medicines only as told by your doctor. Do not take any new medicines without first talking to your doctor. If you are taking blood thinners: Talk with your doctor before taking aspirin or NSAIDs, such as ibuprofen. Take your medicines as told. Take them at the same time each day. Do not do things that could hurt or bruise you. Be careful to avoid falls. Wear an alert bracelet or carry a card that says you take blood thinners. Lifestyle Do not smoke or use any products that contain nicotine or tobacco. If you need help quitting, ask your doctor. Eat heart-healthy foods. Talk with your doctor about the right eating plan for you. Exercise regularly as told by your doctor. Do not drink alcohol. Lose weight if you are overweight. General instructions If you have sleep apnea, treat it as told by your doctor. Do not use diet pills unless your doctor says they are safe for you. Diet pills may make heart problems worse. Keep all follow-up visits. Your doctor will check your heart rate and rhythm regularly. Contact a doctor if: You notice a change in the speed, rhythm, or  strength of your heartbeat. You are taking a blood-thinning medicine and you get more bruising. You get tired more easily when you move or exercise. You have a sudden change in weight. Get help right away if:  You have pain in your chest. You have trouble breathing. You have side effects of blood thinners, such as blood in your vomit, poop (stool), or pee (urine), or bleeding that cannot stop. You have any signs of a stroke. "BE FAST" is an easy way to remember the main warning signs: B - Balance. Dizziness, sudden trouble walking, or loss of balance. E - Eyes. Trouble seeing or a change in how you see. F - Face. Sudden weakness or loss of feeling in the face. The  face or eyelid may droop on one side. A - Arms.Weakness or loss of feeling in an arm. This happens suddenly and usually on one side of the body. S - Speech. Sudden trouble speaking, slurred speech, or trouble understanding what people say. T - Time.Time to call emergency services. Write down what time symptoms started. You have other signs of a stroke, such as: A sudden, very bad headache with no known cause. Feeling like you may vomit (nausea). Vomiting. A seizure. These symptoms may be an emergency. Get help right away. Call 911. Do not wait to see if the symptoms will go away. Do not drive yourself to the hospital. This information is not intended to replace advice given to you by your health care provider. Make sure you discuss any questions you have with your health care provider. Document Revised: 12/30/2021 Document Reviewed: 12/30/2021 Elsevier Patient Education  [employer].

## 2022-06-24 NOTE — Plan of Care (Signed)
Chronic Care Management Provider Comprehensive Care Plan    06/24/2022 Name: Allison Rollins MRN: [id number] DOB: Oct 21, 1950  Referral to Chronic Care Management (CCM) services was placed by Provider:  Marjorie Smolder FNP on Date: 02/03/22.  Chronic Condition 1: ATRIAL FIBRILLATION Provider Assessment and Plan  New onset afib Now back in SR General education re afib and triggers discussed  Continue metoprolol 25 mg bid    Expected Outcome/Goals Addressed This Visit (Provider CCM goals/Provider Assessment and plan  CCM (ATRIAL FIBRILLATION) EXPECTED OUTCOME: MONITOR, SELF-MANAGE AND REDUCE SYMPTOMS OF ATRIAL FIBRILLATION  Symptom Management Condition 1: Take medications as prescribed   Attend all scheduled provider appointments Call pharmacy for medication refills 3-7 days in advance of running out of medications Attend church or other social activities Perform all self care activities independently  Perform IADL's (shopping, preparing meals, housekeeping, managing finances) independently Call provider office for new concerns or questions  Work with the social worker to address care coordination needs and will continue to work with the clinical team to address health care and disease management related needs - check pulse (heart) rate before taking medicine - check pulse (heart) rate once a day - make a plan to exercise regularly - make a plan to eat healthy - keep all lab appointments - take medicine as prescribed - wear medical alert identification Follow Atial Fibrillation action plan- copy mailed to you Social worker will see you for home visit 06/25/22 at 9 am  Chronic Condition 2: CHRONIC KIDNEY DISEASE Provider Assessment and Plan   Continue current regimen.         Relevant Medications    empagliflozin (JARDIANCE) 10 MG TABS tablet     Expected Outcome/Goals Addressed This Visit (Provider CCM goals/Provider Assessment and plan  CCM (CHRONIC KIDNEY DISEASE) EXPECTED  OUTCOME: MONITOR, SELF-MANAGE AND REDUCE SYMPTOMS OF CHRONIC KIDNEY DISEASE  Symptom Management Condition 2: Take medications as prescribed   Attend all scheduled provider appointments Call pharmacy for medication refills 3-7 days in advance of running out of medications Attend church or other social activities Perform all self care activities independently  Perform IADL's (shopping, preparing meals, housekeeping, managing finances) independently Call provider office for new concerns or questions  It is important to keep your blood pressure under good control Choose water as your main beverage Try to walk some everyday- get outside weather permitting  Chronic Condition 3: [place] Provider Assessment and Plan  Well controlled on current regimen. Managed by cardiology.         Relevant Medications    empagliflozin (JARDIANCE) 10 MG TABS tablet    furosemide (LASIX) 20 MG tablet     Expected Outcome/Goals Addressed This Visit (Provider CCM goals/Provider Assessment and plan  CCM (CONGESTIVE HEART FAILURE) EXPECTED OUTCOME: MONITOR, SELF-MANAGE AND REDUCE SYMPTOMS OF CONGESTIVE HEART FAILURE  Symptom Management Condition 3: Take medications as prescribed   Attend all scheduled provider appointments Call pharmacy for medication refills 3-7 days in advance of running out of medications Attend church or other social activities Perform all self care activities independently  Perform IADL's (shopping, preparing meals, housekeeping, managing finances) independently Call provider office for new concerns or questions  Work with the social worker to address care coordination needs and will continue to work with the clinical team to address health care and disease management related needs call office if I gain more than 2 pounds in one day or 5 pounds in one week do ankle pumps when sitting keep legs up  while sitting track weight in diary use salt in moderation watch for  swelling in feet, ankles and legs every day weigh myself daily develop a rescue plan follow rescue plan if symptoms flare-up eat more whole grains, fruits and vegetables, lean meats and healthy fats track symptoms and what helps feel better or worse dress right for the weather, hot or cold Follow heart failure action plan- call doctor early on for change in health status, symptoms Continue weighing daily and keeping a log- keep up the good work Get outside and walk if you are able Follow up / stay in touch with your primary care provider's office for symptom management or change in health status Go to ED for unrelieved shortness of breath, if you feel your heart is racing, chest pain  Chronic Condition 4: HYPERTENSION Provider Assessment and Plan   Well controlled on current regimen. Managed by cardiology.         Relevant Medications    furosemide (LASIX) 20 MG tablet     Expected Outcome/Goals Addressed This Visit (Provider CCM goals/Provider Assessment and plan  CCM (HYPERTENSION) EXPECTED OUTCOME: MONITOR, SELF-MANAGE AND REDUCE SYMPTOMS OF HYPERTENSION  Symptom Management Condition 4: Take medications as prescribed   Attend all scheduled provider appointments Call pharmacy for medication refills 3-7 days in advance of running out of medications Attend church or other social activities Perform all self care activities independently  Perform IADL's (shopping, preparing meals, housekeeping, managing finances) independently Call provider office for new concerns or questions  check blood pressure 3 times per week choose a place to take my blood pressure (home, clinic or office, retail store) write blood pressure results in a log or diary keep a blood pressure log take blood pressure log to all doctor appointments take medications for blood pressure exactly as prescribed report new symptoms to your doctor eat more whole grains, fruits and vegetables, lean meats and healthy  fats Follow low sodium diet- read food labels for sodium content  Problem List Patient Active Problem List   Diagnosis Date Noted   Morbid obesity ([place]) 02/24/2022   Gastroesophageal reflux disease with esophagitis 02/24/2022   Dyslipidemia 09/05/2021   Parkinson's disease 09/05/2021   Chronic obstructive pulmonary disease ([place]) 09/05/2021   Edema 01/21/2021   Stage 3a chronic kidney disease ([place]) 06/17/2020   Osteopenia 01/22/2020   Neuropathy 10/21/2019   Achalasia of esophagus    Restless leg 11/24/2018   Erosive gastritis    Seasonal allergic rhinitis due to pollen 07/20/2018   Recurrent depression ([place]) 12/26/2015   Generalized anxiety disorder 03/12/2015   Prediabetes 03/12/2015   Back pain 03/12/2015   Stress incontinence 01/06/2015   Chronic diastolic heart failure ([place]) 01/03/2015   Obesity (BMI 30-39.9) 07/01/2011   Essential hypertension 07/01/2011   Mixed hyperlipidemia 07/01/2011   GERD (gastroesophageal reflux disease) 09/08/2010    Medication Management  Current Outpatient Medications:    acetaminophen (TYLENOL) 500 MG tablet, Take 1 tablet (500 mg total) by mouth every 6 (six) hours as needed., Disp: 30 tablet, Rfl: 0   albuterol (PROVENTIL) (2.5 MG/3ML) 0.083% nebulizer solution, Take 3 mLs (2.5 mg total) by nebulization every 6 (six) hours as needed for wheezing or shortness of breath., Disp: 150 mL, Rfl: 1   albuterol (VENTOLIN HFA) 108 (90 Base) MCG/ACT inhaler, Inhale 2 puffs into the lungs every 6 (six) hours as needed for wheezing or shortness of breath., Disp: 1 each, Rfl: 0   Alcohol Swabs (B-D SINGLE USE SWABS REGULAR) PADS, Test  sugars daily, Disp: 100 each, Rfl: 3   ALLERGY RELIEF CETIRIZINE 10 MG tablet, Take 1 tablet by mouth every day, Disp: 30 tablet, Rfl: 11   apixaban (ELIQUIS) 5 MG TABS tablet, Take 1 tablet (5 mg total) by mouth 2 (two) times daily., Disp: 60 tablet, Rfl: 4   Ascorbic Acid (VITAMIN C PO), Take by mouth daily. 3 tablets  daily, Disp: , Rfl:    atorvastatin (LIPITOR) 40 MG tablet, Take 1 tablet by mouth every day, Disp: 30 tablet, Rfl: 0   CALCIUM PO, Take by mouth daily., Disp: , Rfl:    cholecalciferol (VITAMIN D3) 25 MCG (1000 UT) tablet, Take 1,000 Units by mouth daily., Disp: , Rfl:    famotidine (PEPCID) 20 MG tablet, Take 1 tablet by mouth at bedtime, Disp: 30 tablet, Rfl: 0   fluticasone (FLONASE) 50 MCG/ACT nasal spray, Use 2 sprays into each nostril every day (Patient taking differently: Place 2 sprays into both nostrils daily.), Disp: 16 g, Rfl: 5   furosemide (LASIX) 20 MG tablet, Take 1 tablet (20 mg total) by mouth daily as needed., Disp: 90 tablet, Rfl: 1   gabapentin (NEURONTIN) 300 MG capsule, Take 2 capsules by mouth 3 times a day, Disp: 180 capsule, Rfl: 2   glucose blood (ACCU-CHEK AVIVA PLUS) test strip, Test sugars daily, Disp: 100 each, Rfl: 3   lisinopril (ZESTRIL) 10 MG tablet, Take 1 tablet by mouth every day, Disp: 30 tablet, Rfl: 4   metoprolol tartrate (LOPRESSOR) 25 MG tablet, Take 1 tablet (25 mg total) by mouth 2 (two) times daily., Disp: 60 tablet, Rfl: 4   nitroGLYCERIN (NITROSTAT) 0.4 MG SL tablet, Place 1 tablet (0.4 mg total) under the tongue every 5 (five) minutes as needed for chest pain., Disp: 20 tablet, Rfl: 3   pantoprazole (PROTONIX) 40 MG tablet, Take 1 tablet by mouth twice daily, Disp: 60 tablet, Rfl: 4   Potassium 99 MG TABS, Take 1 tablet by mouth daily., Disp: , Rfl:    pramipexole (MIRAPEX) 0.5 MG tablet, Take 1 tablet by mouth at bedtime, Disp: 30 tablet, Rfl: 2   sertraline (ZOLOFT) 100 MG tablet, Take 2 tablets (200 mg total) by mouth daily., Disp: 180 tablet, Rfl: 3  Cognitive Assessment Identity Confirmed: : Name; DOB Cognitive Status: Normal Other:  : N/A   Functional Assessment Hearing Difficulty or Deaf: no Wear Glasses or Blind: yes Vision Management: can see well w/ glasses Concentrating, Remembering or Making Decisions Difficulty (CP):  no Difficulty Communicating: no Difficulty Eating/Swallowing: no   Caregiver Assessment  Primary Source of Support/Comfort: child(ren) Name of Support/Comfort Primary Source: adult son and other relatives People in Home: alone   Planned Interventions  Evaluation of current treatment plan related to hypertension self management and patient's adherence to plan as established by provider;   Reviewed prescribed diet low sodium Reviewed medications with patient and discussed importance of compliance;  Counseled on the importance of exercise goals with target of 150 minutes per week Discussed plans with patient for ongoing care management follow up and provided patient with direct contact information for care management team; Advised patient, providing education and rationale, to monitor blood pressure daily and record, calling PCP for findings outside established parameters;  Discussed complications of poorly controlled blood pressure such as heart disease, stroke, circulatory complications, vision complications, kidney impairment, sexual dysfunction;  Provided education on low sodium diet Provided education about placing scale on hard, flat surface Advised patient to weigh each morning after emptying bladder  Discussed importance of daily weight and advised patient to weigh and record daily Reviewed role of diuretics in prevention of fluid overload and management of heart failure Discussed the importance of keeping all appointments with provider Provided patient with education about the role of exercise in the management of heart failure Reviewed HF action plan, importance of calling doctor early on for change in health status, symptoms Reinforced normal parameters for heart rate and encouraged pt to check heart rate daily Encouraged pt to call primary care provider for change in health status/ symptom management Evaluation of current treatment plan related to chronic kidney disease self  management and patient's adherence to plan as established by provider      Provided education to patient re: stroke prevention, s/s of heart attack and stroke    Reviewed prescribed diet heart healthy, low sodium Reviewed medications with patient and discussed importance of compliance    Counseled on the importance of exercise goals with target of 150 minutes per week     Advised patient, providing education and rationale, to monitor blood pressure daily and record, calling PCP for findings outside established parameters    Discussed complications of poorly controlled blood pressure such as heart disease, stroke, circulatory complications, vision complications, kidney impairment, sexual dysfunction    Reinforced importance of taking all medications as prescribed Reinforced with pt to call into primary care provider office if she is low on any medication and cannot afford refill Provider order and care plan reviewed. Collaborated with PharmD regarding patient care and plan. Counseled on increased risk of stroke due to Afib and benefits of anticoagulation for stroke prevention           Reviewed importance of adherence to anticoagulant exactly as prescribed Counseled on importance of regular laboratory monitoring as prescribed Afib action plan reviewed Reviewed upcoming scheduled appointments  Interaction and coordination with outside resources, practitioners, and providers See CCM Referral  Care Plan: Printed and mailed to patient

## 2022-06-24 NOTE — Chronic Care Management (AMB) (Signed)
Chronic Care Management   CCM RN Visit Note  06/24/2022 Name: Allison Rollins MRN: [id number] DOB: 1950/07/19  Subjective: Allison Rollins is a 72 y.o. year old female who is a primary care patient of Gwenlyn Perking, FNP. The patient was referred to the Chronic Care Management team for assistance with care management needs subsequent to provider initiation of CCM services and plan of care.    Today's Visit:  Engaged with patient by telephone for follow up visit.        Goals Addressed             This Visit's Progress    CCM (ATRIAL FIBRILLATION) EXPECTED OUTCOME: MONITOR, SELF-MANAGE AND REDUCE SYMPTOMS OF ATRIAL FIBRILLATION       Current Barriers:  Knowledge Deficits related to Atrial Fibrillation management Chronic Disease Management support and education needs related to Atrial Fibrillation Patient states recently diagnosed with Atrial Fibrillation, followed up at A-Fib clinic on 06/16/22, pt reports she is taking blood thinner as prescribed  Planned Interventions: Provider order and care plan reviewed. Collaborated with PharmD regarding patient care and plan. Counseled on increased risk of stroke due to Afib and benefits of anticoagulation for stroke prevention           Reviewed importance of adherence to anticoagulant exactly as prescribed Counseled on importance of regular laboratory monitoring as prescribed Afib action plan reviewed Reviewed upcoming scheduled appointments  Symptom Management: Take medications as prescribed   Attend all scheduled provider appointments Call pharmacy for medication refills 3-7 days in advance of running out of medications Attend church or other social activities Perform all self care activities independently  Perform IADL's (shopping, preparing meals, housekeeping, managing finances) independently Call provider office for new concerns or questions  Work with the social worker to address care coordination needs and will continue to work  with the clinical team to address health care and disease management related needs - check pulse (heart) rate before taking medicine - check pulse (heart) rate once a day - make a plan to exercise regularly - make a plan to eat healthy - keep all lab appointments - take medicine as prescribed - wear medical alert identification Follow Atial Fibrillation action plan- copy mailed to you Social worker will see you for home visit 06/25/22 at 9 am  Follow Up Plan: Telephone follow up appointment with care management team member scheduled for:  07/20/22 at 3 pm       CCM (CHRONIC KIDNEY DISEASE) EXPECTED OUTCOME: MONITOR, SELF-MANAGE AND REDUCE SYMPTOMS OF CHRONIC KIDNEY DISEASE       Current Barriers:  Knowledge Deficits related to Chronic Kidney Disease Chronic Disease Management support and education needs related to management of Chronic Kidney Disease No Advanced Directives in place- pt declines Patient reports she tries to eat as healthy as she can, does do some light exercise when she feels able, is now checking blood pressure daily and weighing daily Patient reports she is no longer taking jardiance  Planned Interventions: Evaluation of current treatment plan related to chronic kidney disease self management and patient's adherence to plan as established by provider      Provided education to patient re: stroke prevention, s/s of heart attack and stroke    Reviewed prescribed diet heart healthy, low sodium Reviewed medications with patient and discussed importance of compliance    Counseled on the importance of exercise goals with target of 150 minutes per week     Advised patient, providing education and rationale,  to monitor blood pressure daily and record, calling PCP for findings outside established parameters    Discussed complications of poorly controlled blood pressure such as heart disease, stroke, circulatory complications, vision complications, kidney impairment, sexual  dysfunction    Reinforced importance of taking all medications as prescribed Reinforced with pt to call into primary care provider office if she is low on any medication and cannot afford refill  Symptom Management: Take medications as prescribed   Attend all scheduled provider appointments Call pharmacy for medication refills 3-7 days in advance of running out of medications Attend church or other social activities Perform all self care activities independently  Perform IADL's (shopping, preparing meals, housekeeping, managing finances) independently Call provider office for new concerns or questions  It is important to keep your blood pressure under good control Choose water as your main beverage Try to walk some everyday- get outside weather permitting  Follow Up Plan: Telephone follow up appointment with care management team member scheduled for:  07/20/22 at 3 pm       CCM ([place]) EXPECTED OUTCOME: MONITOR, SELF-MANAGE AND REDUCE SYMPTOMS OF CONGESTIVE HEART FAILURE       Current Barriers:  Knowledge Deficits related to Congestive Heart Failure Care Coordination needs related to depression in a patient with Congestive Heart Failure Chronic Disease Management support and education needs related to management of Congestive Heart Failure and diet No Advanced Directives in place- pt declines Patient reports she has a scale and is now weighing once daily, states [employer] is sending her a new scale, pt states she has lost approximately 15-20 pounds. Patient can benefit from teaching/ reinforcement HF action plan Patient reports she has been to ED several times over past few months for varying reasons (dehydration, heart out of rhythm)   Planned Interventions: Provided education on low sodium diet Provided education about placing scale on hard, flat surface Advised patient to weigh each morning after emptying bladder Discussed importance of daily weight and  advised patient to weigh and record daily Reviewed role of diuretics in prevention of fluid overload and management of heart failure Discussed the importance of keeping all appointments with provider Provided patient with education about the role of exercise in the management of heart failure Reviewed HF action plan, importance of calling doctor early on for change in health status, symptoms Reinforced normal parameters for heart rate and encouraged pt to check heart rate daily Encouraged pt to call primary care provider for change in health status/ symptom management  Symptom Management: Take medications as prescribed   Attend all scheduled provider appointments Call pharmacy for medication refills 3-7 days in advance of running out of medications Attend church or other social activities Perform all self care activities independently  Perform IADL's (shopping, preparing meals, housekeeping, managing finances) independently Call provider office for new concerns or questions  Work with the social worker to address care coordination needs and will continue to work with the clinical team to address health care and disease management related needs call office if I gain more than 2 pounds in one day or 5 pounds in one week do ankle pumps when sitting keep legs up while sitting track weight in diary use salt in moderation watch for swelling in feet, ankles and legs every day weigh myself daily develop a rescue plan follow rescue plan if symptoms flare-up eat more whole grains, fruits and vegetables, lean meats and healthy fats track symptoms and what helps feel better or worse dress right  for the weather, hot or cold Follow heart failure action plan- call doctor early on for change in health status, symptoms Continue weighing daily and keeping a log- keep up the good work Get outside and walk if you are able Follow up / stay in touch with your primary care provider's office for symptom  management or change in health status Go to ED for unrelieved shortness of breath, if you feel your heart is racing, chest pain  Follow Up Plan: Telephone follow up appointment with care management team member scheduled for:  07/20/22 at 3 pm       CCM (HYPERTENSION) EXPECTED OUTCOME: MONITOR, SELF-MANAGE AND REDUCE SYMPTOMS OF HYPERTENSION       Current Barriers:  Knowledge Deficits related to Hypertension Chronic Disease Management support and education needs related to management of Hypertension and diet No Advanced Directives in place- patient declines Patient reports she lives alone, has adult son nearby and another relative than can assist her if needed, pt drives and is overall independent, states she does not like to go up steps, takes extra time,  pt reports she would like LCSW to outreach her for depression management [id number]=7 and for interpersonal family relationship difficulties, pt states she gets 23$ per month food stamps and does not always have enough money to buy food Patient reports she is checking blood pressure daily  Patient verbalizes understanding of LCSW visit tomorrow on 06/25/22 at 9 am  Planned Interventions: Evaluation of current treatment plan related to hypertension self management and patient's adherence to plan as established by provider;   Reviewed prescribed diet low sodium Reviewed medications with patient and discussed importance of compliance;  Counseled on the importance of exercise goals with target of 150 minutes per week Discussed plans with patient for ongoing care management follow up and provided patient with direct contact information for care management team; Advised patient, providing education and rationale, to monitor blood pressure daily and record, calling PCP for findings outside established parameters;  Discussed complications of poorly controlled blood pressure such as heart disease, stroke, circulatory complications, vision complications, kidney  impairment, sexual dysfunction;   Symptom Management: Take medications as prescribed   Attend all scheduled provider appointments Call pharmacy for medication refills 3-7 days in advance of running out of medications Attend church or other social activities Perform all self care activities independently  Perform IADL's (shopping, preparing meals, housekeeping, managing finances) independently Call provider office for new concerns or questions  check blood pressure 3 times per week choose a place to take my blood pressure (home, clinic or office, retail store) write blood pressure results in a log or diary keep a blood pressure log take blood pressure log to all doctor appointments take medications for blood pressure exactly as prescribed report new symptoms to your doctor eat more whole grains, fruits and vegetables, lean meats and healthy fats Follow low sodium diet- read food labels for sodium content  Follow Up Plan: Telephone follow up appointment with care management team member scheduled for:  07/20/22 at 3 pm          Plan:Telephone follow up appointment with care management team member scheduled for:  07/20/22 at 3 PM  Jacqlyn Larsen [facility], BSN RN Case Manager [place] [phone]

## 2022-06-24 NOTE — Patient Instructions (Signed)
Visit Information  Thank you for taking time to visit with me today. Please don't hesitate to contact me if I can be of assistance to you.   Following are the goals we discussed today:   Goals Addressed               This Visit's Progress     Find Help in My Community. (pt-stated)   On track     Care Coordination Interventions:  Interventions Today    Flowsheet Row Most Recent Value  Chronic Disease   Chronic disease during today's visit Hypertension (HTN), Chronic Obstructive Pulmonary Disease (COPD), Other, Chronic Kidney Disease/End Stage Renal Disease (ESRD)  [Parkinson's Disease, Neuropathy, Recurrent Depression & Generalized Anxiety Disorder]  General Interventions   General Interventions Discussed/Reviewed General Interventions Discussed, General Interventions Reviewed, Annual Eye Exam, Labs, Durable Medical Equipment (DME), Vaccines, Health Screening, Community Resources, Doctor Visits, Level of Care  Labs Hgb A1c annually  Vaccines COVID-19, Flu, Pneumonia, RSV, Shingles  Doctor Visits Discussed/Reviewed Doctor Visits Discussed, Doctor Visits Reviewed, Annual Wellness Visits, PCP, Specialist  Health Screening Colonoscopy, Bone Density, Mammogram  Durable Medical Equipment (DME) BP Cuff, Walker, Community education officer  PCP/Specialist Visits Compliance with follow-up visit  Level of Care Applications, Assisted Living, Personal Care Academic librarian, Personal Care Services, Other  [place] Application]  Exercise Interventions   Exercise Discussed/Reviewed Exercise Discussed, Exercise Reviewed, Physical Activity, Assistive device use and maintanence  Physical Activity Discussed/Reviewed Physical Activity Discussed, Physical Activity Reviewed, Types of exercise  Education Interventions   Education Provided Provided Engineer, site, Provided Education, Provided Web-based Education  Provided Verbal Education On Nutrition, Eye  Care, Mental Health/Coping with Illness, Applications, Exercise, Medication, Personal assistant, [employer], When to see the doctor  Applications Medicaid, Personal Care Services, Other  [place] Discussed, Mental Health Reviewed, Coping Strategies, Crisis, Anxiety, Depression  Nutrition Interventions   Nutrition Discussed/Reviewed Nutrition Discussed, Nutrition Reviewed, Fluid intake, Portion sizes, Decreasing sugar intake, Decreasing salt, Decreasing fats, Increaing proteins  Pharmacy Interventions   Pharmacy Dicussed/Reviewed Pharmacy Topics Discussed, Pharmacy Topics Reviewed, Medication Adherence, Affording Medications  Safety Interventions   Safety Discussed/Reviewed Safety Discussed, Safety Reviewed, Fall Risk  Advanced Directive Interventions   Advanced Directives Discussed/Reviewed Advanced Directives Discussed, Advanced Directives Reviewed     Active Listening & Reflection Utilized.  Verbalization of Feelings Encouraged.  Emotional Support Provided. Feelings of Frustration Regarding Loss if Independence Validated. Caregiver Stress Acknowledged. Caregiver Resources Reviewed. Caregiver Support Groups Mailed. Self-Enrollment in Caregiver Support Group of Interest Emphasized. Problem Solving Interventions Enacted. Solution-Focused Strategies Employed. Task-Centered Solutions Implemented. Acceptance & Commitment Therapy Initiated. Cognitive Behavioral Therapy Engaged. Client Centered Therapy Performed. CSW Collaboration with Daughter-in-Law, [name] to [employer] with Toni Arthurs to The Following List of [facility], In An Effort to [employer] for Patient: ~ [employer] [phone]# [phone]) ~ [employer] 563-249-8027) ~ Lot [phone] (# [phone]) ~ [place] [phone]#  [phone]) ~ [place] 712-043-1351) ~ [facility] [phone] 002-9230# 304-712-1816) ~ [employer] (# 7063889455) ~ [place] (# [phone]) ~ [place] (# 640-545-5438) ~ [place] [phone]) ~ Aging, [place] of [place] [phone]) [place] with Daughter-in-Law, [name] to Explain that CSW Mailed The Following List of Applications & Instructions to Patient's Home on 06/24/2022: ~ 2023 Medicaid Tips ~  Medicaid Application ~ [place] Instructions ~ [place] Application ~ [place] Provider List ~ [place] with Daughter-in-Law, [name] to Ensure Agreement to Assist Patient with Completion of Application for Medicaid & Submission to The [place] [phone]), for Processing. CSW Collaboration with Daughter-in-Law, [name] to Ensure Agreement to Assist Patient with Completion of Application for [place] to Primary Care Provider, Family Nurse Practitioner, Marjorie Smolder with [place] [phone]), for Review & Signature. CSW Collaboration with Daughter-in-Law, [name] to Ensure Agreement to Assist Patient with Completion of Application for [place] to [facility] Patient Financial Services [phone]), for Processing. CSW Collaboration with Daughter-in-Law, [name] to [employer] with CSW [phone]# [phone]), if She Has Questions, Needs Assistance with Application Completion & Submission, or If Additional Social Work Needs Are Identified Between Now & Our Next Scheduled Telephone Follow-Up [employer].      Our next appointment is by telephone on 07/06/2022 at 11:00 am.  Please call the care guide team at  [phone] if you need to cancel or reschedule your appointment.   If you are experiencing a Mental Health or [place] or need someone to talk to, please call the Suicide and Crisis Lifeline: 988 call the [place] National Suicide Prevention Lifeline: 6020395692 or TTY: (215)377-6578 TTY 331-038-8221) to talk to a trained counselor call 1-800-273-TALK (toll free, 24 hour hotline) go to [facility] [address], [place] [phone]) call the [place]: [phone] call 911  Patient verbalizes understanding of instructions and care plan provided today and agrees to view in [place]. Active MyChart status and patient understanding of how to access instructions and care plan via MyChart confirmed with patient.     Telephone follow up appointment with care management team member scheduled for:  07/06/2022 at 11:00 am.  Nat Christen, BSW, MSW, [place]  Licensed Clinical Social Worker  [place]  Mailing [place]. [address] [place], [place] [address] Physical Address-[address], [place], [place] [address] Toll Free Main # [phone] Fax # [phone] Cell # [phone] Di [email or web address]

## 2022-06-24 NOTE — Patient Outreach (Signed)
Care Coordination   Follow Up Visit Note   06/24/2022  Name: Allison Rollins MRN: [id number] DOB: 04-18-1951  Allison Rollins is a 72 y.o. year old female who sees Gwenlyn Perking, FNP for primary care. I spoke with Allison Rollins and daughter-in-law, Allison Rollins by phone today.  What matters to the patients health and wellness today?  Find Help in My Community.   Goals Addressed               This Visit's Progress     Find Help in My Community. (pt-stated)   On track     Care Coordination Interventions:  Interventions Today    Flowsheet Row Most Recent Value  Chronic Disease   Chronic disease during today's visit Hypertension (HTN), Chronic Obstructive Pulmonary Disease (COPD), Other, Chronic Kidney Disease/End Stage Renal Disease (ESRD)  [Parkinson's Disease, Neuropathy, Recurrent Depression & Generalized Anxiety Disorder]  General Interventions   General Interventions Discussed/Reviewed General Interventions Discussed, General Interventions Reviewed, Annual Eye Exam, Labs, Durable Medical Equipment (DME), Vaccines, Health Screening, Community Resources, Doctor Visits, Level of Care  Labs Hgb A1c annually  Vaccines COVID-19, Flu, Pneumonia, RSV, Shingles  Doctor Visits Discussed/Reviewed Doctor Visits Discussed, Doctor Visits Reviewed, Annual Wellness Visits, PCP, Specialist  Health Screening Colonoscopy, Bone Density, Mammogram  Durable Medical Equipment (DME) BP Cuff, Walker, Community education officer  PCP/Specialist Visits Compliance with follow-up visit  Level of Care Applications, Assisted Living, Personal Care Academic librarian, Personal Care Services, Other  [place] Application]  Exercise Interventions   Exercise Discussed/Reviewed Exercise Discussed, Exercise Reviewed, Physical Activity, Assistive device use and maintanence  Physical Activity Discussed/Reviewed Physical Activity Discussed, Physical Activity Reviewed, Types  of exercise  Education Interventions   Education Provided Provided Engineer, site, Provided Education, Provided Web-based Education  Provided Verbal Education On Nutrition, Eye Care, Mental Health/Coping with Illness, Applications, Exercise, Medication, Personal assistant, [employer], When to see the doctor  Applications Medicaid, Personal Care Services, Other  [place] Discussed, Mental Health Reviewed, Coping Strategies, Crisis, Anxiety, Depression  Nutrition Interventions   Nutrition Discussed/Reviewed Nutrition Discussed, Nutrition Reviewed, Fluid intake, Portion sizes, Decreasing sugar intake, Decreasing salt, Decreasing fats, Increaing proteins  Pharmacy Interventions   Pharmacy Dicussed/Reviewed Pharmacy Topics Discussed, Pharmacy Topics Reviewed, Medication Adherence, Affording Medications  Safety Interventions   Safety Discussed/Reviewed Safety Discussed, Safety Reviewed, Fall Risk  Advanced Directive Interventions   Advanced Directives Discussed/Reviewed Advanced Directives Discussed, Advanced Directives Reviewed     Active Listening & Reflection Utilized.  Verbalization of Feelings Encouraged.  Emotional Support Provided. Feelings of Frustration Regarding Loss if Independence Validated. Caregiver Stress Acknowledged. Caregiver Resources Reviewed. Caregiver Support Groups Mailed. Self-Enrollment in Caregiver Support Group of Interest Emphasized. Problem Solving Interventions Enacted. Solution-Focused Strategies Employed. Task-Centered Solutions Implemented. Acceptance & Commitment Therapy Initiated. Cognitive Behavioral Therapy Engaged. Client Centered Therapy Performed. CSW Collaboration with Daughter-in-Law, Allison Rollins to [employer] with Toni Arthurs to The Following List of [facility], In An Effort to [employer] for Patient: ~ [employer] [phone]# [phone]) ~ [employer] 715-053-8977) ~ Lot 251-076-0558 (# [phone]) ~ [place] [phone]# [phone]) ~ [place] [phone]) [phone]) ~ [facility] [phone]# [phone]) ~ [employer]igroup (# [phone]) ~ [place] (# [phone]) ~ [place] (# 940-082-6243) ~ Ricky [place] [phone]) ~ Aging, [place]  of [place] [phone]) [place] with Daughter-in-Law, Allison Rollins to Explain that CSW Mailed The Following List of Applications & Instructions to Patient's Home on 06/24/2022: ~ 2023 Medicaid Tips ~ Medicaid Application ~ [place] Instructions ~ [place] Application ~ [place] Provider List ~ [place] with Daughter-in-Law, Allison Rollins to Ensure Agreement to Assist Patient with Completion of Application for Medicaid & Submission to The [place] [phone]), for Processing. CSW Collaboration with Daughter-in-Law, Allison Rollins to Ensure Agreement to Assist Patient with Completion of Application for [place] to Primary Care Provider, Family Nurse Practitioner, Marjorie Smolder with [place] [phone]), for Review & Signature. CSW Collaboration with Daughter-in-Law, Allison Rollins to Ensure Agreement to Assist Patient with Completion of Application for [place] to [facility] Patient Financial Services [phone]), for Processing. CSW Collaboration with Daughter-in-Law, Allison Rollins to [employer] with CSW [phone]# [phone]), if She Has Questions, Needs Assistance with Application Completion & Submission, or If Additional Social Work Needs Are Identified Between  Now & Our Next Scheduled Telephone Follow-Up [employer].      SDOH assessments and interventions completed:  Yes.  Care Coordination Interventions:  Yes, provided.   Follow up plan: Follow up call scheduled for 07/06/2022 at 11:00 am.  Encounter Outcome:  Pt. Visit Completed.   Nat Christen, BSW, MSW, LCSW  Licensed Education officer, [facility]  Mailing [place], [place], [place] [address] Physical Address-[address], [place], [place] [address] Toll Free Main # [phone] Fax # [phone] Cell # [phone] Di [email or web address]

## 2022-06-25 ENCOUNTER — Other Ambulatory Visit: Payer: Self-pay | Admitting: *Deleted

## 2022-06-25 NOTE — Telephone Encounter (Signed)
Enrollment with [employer] CAR[employer] for [place] mediation is currently pending.   Patient may be eligible for Low Income Subsidy and must apply. If denied, letter will need to be sent to company.  Provided patient with this information and suggested patient calls [employer]SA to apply. Provided phone number.

## 2022-07-02 ENCOUNTER — Telehealth: Payer: Self-pay | Admitting: Family Medicine

## 2022-07-02 NOTE — Telephone Encounter (Signed)
Pt probably has the CAP program and possibly PCS forms, will have her bring into the office to have [place] fill it out.

## 2022-07-06 ENCOUNTER — Encounter: Payer: Self-pay | Admitting: *Deleted

## 2022-07-06 ENCOUNTER — Ambulatory Visit: Payer: Self-pay | Admitting: *Deleted

## 2022-07-06 NOTE — Patient Instructions (Signed)
Visit Information  Thank you for taking time to visit with me today. Please don't hesitate to contact me if I can be of assistance to you.   Following are the goals we discussed today:   Goals Addressed               This Visit's Progress     Find Help in My Community. (pt-stated)   On track     Care Coordination Interventions:  Interventions Today    Flowsheet Row Most Recent Value  Chronic Disease   Chronic disease during today's visit Hypertension (HTN), Chronic Obstructive Pulmonary Disease (COPD), Other, Chronic Kidney Disease/End Stage Renal Disease (ESRD)  [Parkinson's Disease, Neuropathy, Recurrent Depression & Generalized Anxiety Disorder]  General Interventions   General Interventions Discussed/Reviewed General Interventions Discussed, General Interventions Reviewed, Annual Eye Exam, Labs, Durable Medical Equipment (DME), Vaccines, Health Screening, Community Resources, Doctor Visits, Level of Care  Labs Hgb A1c annually  Vaccines COVID-19, Flu, Pneumonia, RSV, Shingles  Doctor Visits Discussed/Reviewed Doctor Visits Discussed, Doctor Visits Reviewed, Annual Wellness Visits, PCP, Specialist  Health Screening Colonoscopy, Bone Density, Mammogram  Durable Medical Equipment (DME) BP Cuff, Walker, Community education officer  PCP/Specialist Visits Compliance with follow-up visit  Level of Care Applications, Assisted Living, Personal Care Academic librarian, Personal Care Services, Other  [place] Application]  Exercise Interventions   Exercise Discussed/Reviewed Exercise Discussed, Exercise Reviewed, Physical Activity, Assistive device use and maintanence  Physical Activity Discussed/Reviewed Physical Activity Discussed, Physical Activity Reviewed, Types of exercise  Education Interventions   Education Provided Provided Engineer, site, Provided Education, Provided Web-based Education  Provided Verbal Education On Nutrition, Eye  Care, Mental Health/Coping with Illness, Applications, Exercise, Medication, Personal assistant, [employer], When to see the doctor  Applications Medicaid, Personal Care Services, Other  [place] Discussed, Mental Health Reviewed, Coping Strategies, Crisis, Anxiety, Depression  Nutrition Interventions   Nutrition Discussed/Reviewed Nutrition Discussed, Nutrition Reviewed, Fluid intake, Portion sizes, Decreasing sugar intake, Decreasing salt, Decreasing fats, Increaing proteins  Pharmacy Interventions   Pharmacy Dicussed/Reviewed Pharmacy Topics Discussed, Pharmacy Topics Reviewed, Medication Adherence, Affording Medications  Safety Interventions   Safety Discussed/Reviewed Safety Discussed, Safety Reviewed, Fall Risk  Advanced Directive Interventions   Advanced Directives Discussed/Reviewed Advanced Directives Discussed, Advanced Directives Reviewed     Active Listening & Reflection Utilized.  Verbalization of Feelings Encouraged.  Emotional Support Provided. Feelings of Frustration Regarding Loss if Independence Validated. Caregiver Stress Acknowledged. Caregiver Resources Reviewed. Caregiver Support Groups Mailed. Self-Enrollment in Caregiver Support Group of Interest Emphasized. Problem Solving Interventions Enacted. Solution-Focused Strategies Employed. Task-Centered Solutions Implemented. Acceptance & Commitment Therapy Initiated. Cognitive Behavioral Therapy Engaged. Client Centered Therapy Performed. CSW Continued Collaboration with Daughter-in-Law, [name] to [employer] with Toni Arthurs to The Following List of [facility], In An Effort to [employer] for Patient: ~ Boeing [phone]# [phone]) ~ [employer] [phone]) ~ Lot 905-496-1802 (# [phone]) ~ [place] [phone]#  [phone]) ~ [place] [phone]) ~ [facility] [phone]# [phone]) ~ Citigroup (# [phone]) ~ [place] (# [phone]) ~ [place] (# [phone]) ~ [place] [phone]) ~ Aging, [place] of [place] [phone]) CSW Continued Collaboration with Daughter-in-Law, [name] to [employer] with Completion of Application for [employer] & Submission to The [place] (#  [phone]), for Processing. ~ 2023 Medicaid Tips ~ Medicaid Application CSW Continued Collaboration with Daughter-in-Law, [name] to [employer] with Completion of Application for [place] to [employer] [phone]), for Processing. ~ [place] Instructions ~ [place] Application ~ [place] Provider List CSW Continued Collaboration with Daughter-in-Law, [name] to [employer] with Completion of Application for [place] to [facility] Patient Financial Services [phone]), for Processing. ~ [place] with Daughter-in-Law, [name] to [employer] with CSW (# [phone]), if She Has Questions, Needs Assistance with Application Completion & Submission, or If Additional Social Work Needs Are Identified Between Now & Our Next Scheduled Telephone Follow-Up [employer].      Our next appointment is by telephone on 07/20/2022 at 10:15 am.  Please call the care guide team at [phone] if you need to cancel or reschedule your appointment.   If you are experiencing a Mental Health or [place] or need someone to talk to, please call the Suicide and Crisis Lifeline: 988 call the [place] National Suicide Prevention Lifeline:  509-509-4386 or TTY: 325-596-8713 TTY 281 834 1997) to talk to a trained counselor call 1-800-273-TALK (toll free, 24 hour hotline) go to [facility] [address], [place] [phone]) call the [place]: [phone] call 911  Patient verbalizes understanding of instructions and care plan provided today and agrees to view in [place]. Active MyChart status and patient understanding of how to access instructions and care plan via MyChart confirmed with patient.     Telephone follow up appointment with care management team member scheduled for:  07/20/2022 at 10:15 am.  Nat Christen, BSW, MSW, [place]  Licensed Clinical Social Worker  [place]  Mailing [place]. [address] [place], [place] [address] Physical Address-[address] [place], [place] [address] Toll Free Main # [phone] Fax # [phone] Cell # [phone] Di [email or web address]

## 2022-07-06 NOTE — Patient Outreach (Signed)
Care Coordination   Follow Up Visit Note   07/06/2022  Name: Allison Rollins MRN: [id number] DOB: 1950/08/03  Allison Rollins is a 72 y.o. year old female who sees Gwenlyn Perking, FNP for primary care. I spoke with Allison Rollins and daughter-in-law, [name] by phone today.  What matters to the patients health and wellness today?  Find Help in My Community.   Goals Addressed               This Visit's Progress     Find Help in My Community. (pt-stated)   On track     Care Coordination Interventions:  Interventions Today    Flowsheet Row Most Recent Value  Chronic Disease   Chronic disease during today's visit Hypertension (HTN), Chronic Obstructive Pulmonary Disease (COPD), Other, Chronic Kidney Disease/End Stage Renal Disease (ESRD)  [Parkinson's Disease, Neuropathy, Recurrent Depression & Generalized Anxiety Disorder]  General Interventions   General Interventions Discussed/Reviewed General Interventions Discussed, General Interventions Reviewed, Annual Eye Exam, Labs, Durable Medical Equipment (DME), Vaccines, Health Screening, Community Resources, Doctor Visits, Level of Care  Labs Hgb A1c annually  Vaccines COVID-19, Flu, Pneumonia, RSV, Shingles  Doctor Visits Discussed/Reviewed Doctor Visits Discussed, Doctor Visits Reviewed, Annual Wellness Visits, PCP, Specialist  Health Screening Colonoscopy, Bone Density, Mammogram  Durable Medical Equipment (DME) BP Cuff, Walker, Community education officer  PCP/Specialist Visits Compliance with follow-up visit  Level of Care Applications, Assisted Living, Personal Care Academic librarian, Personal Care Services, Other  [place] Application]  Exercise Interventions   Exercise Discussed/Reviewed Exercise Discussed, Exercise Reviewed, Physical Activity, Assistive device use and maintanence  Physical Activity Discussed/Reviewed Physical Activity Discussed, Physical Activity Reviewed, Types  of exercise  Education Interventions   Education Provided Provided Engineer, site, Provided Education, Provided Web-based Education  Provided Verbal Education On Nutrition, Eye Care, Mental Health/Coping with Illness, Applications, Exercise, Medication, Personal assistant, [employer], When to see the doctor  Applications Medicaid, Personal Care Services, Other  [place] Discussed, Mental Health Reviewed, Coping Strategies, Crisis, Anxiety, Depression  Nutrition Interventions   Nutrition Discussed/Reviewed Nutrition Discussed, Nutrition Reviewed, Fluid intake, Portion sizes, Decreasing sugar intake, Decreasing salt, Decreasing fats, Increaing proteins  Pharmacy Interventions   Pharmacy Dicussed/Reviewed Pharmacy Topics Discussed, Pharmacy Topics Reviewed, Medication Adherence, Affording Medications  Safety Interventions   Safety Discussed/Reviewed Safety Discussed, Safety Reviewed, Fall Risk  Advanced Directive Interventions   Advanced Directives Discussed/Reviewed Advanced Directives Discussed, Advanced Directives Reviewed     Active Listening & Reflection Utilized.  Verbalization of Feelings Encouraged.  Emotional Support Provided. Feelings of Frustration Regarding Loss if Independence Validated. Caregiver Stress Acknowledged. Caregiver Resources Reviewed. Caregiver Support Groups Mailed. Self-Enrollment in Caregiver Support Group of Interest Emphasized. Problem Solving Interventions Enacted. Solution-Focused Strategies Employed. Task-Centered Solutions Implemented. Acceptance & Commitment Therapy Initiated. Cognitive Behavioral Therapy Engaged. Client Centered Therapy Performed. CSW Continued Collaboration with Daughter-in-Law, [name] to [employer] with Toni Arthurs to The Following List of [facility], In An Effort to [employer] for Patient: ~ [employer] [phone]# [phone]) ~ [employer] [phone]) ~ Lot 313-201-5220 (# [phone]) ~ [place] [phone]# [phone]) ~ [place] [phone]) ~ [facility] [phone]# [phone]) ~ [employer] (# [phone]) ~ [place] (# [phone]) ~ [place] (# [phone]) ~ [place] [phone]) ~ Aging, [place]  [facility] [place] [phone]) CSW Continued Collaboration with Daughter-in-Law, [name] to [employer] with Completion of Application for [employer] & Submission to [employer] [place] [phone]), for Processing. ~ 2023 Medicaid Tips ~ Medicaid Application CSW Continued Collaboration with Daughter-in-Law, [name] to [employer] with Completion of Application for [place] to [employer] [phone]), for Processing. ~ [place] Instructions ~ [place] Application ~ [facility] Provider List CSW Continued Collaboration with Daughter-in-Law, [name] to [employer] with Completion of Application for [place] to [facility] Patient Financial Services [phone]), for Processing. ~ [place] with Daughter-in-Law, [name] to [employer] with CSW (# [phone]), if She Has Questions, Needs Assistance with Application Completion & Submission, or If Additional Social Work Needs Are Identified Between Now & Our Next Scheduled Telephone Follow-Up [employer].      SDOH assessments and interventions completed:  Yes.  Care Coordination Interventions:  Yes, provided.   Follow up plan: Follow up call scheduled for 07/20/2022 at 10:15 am.  Encounter Outcome:   Pt. Visit Completed.   Nat Christen, BSW, MSW, LCSW  Licensed Education officer, [facility]  Mailing [place] [address], [place], [place] [address] Physical Address-[address] [place], [place] [address] Toll Free Main # [phone] Fax # [phone] Cell # [phone] [email or web address]

## 2022-07-07 ENCOUNTER — Other Ambulatory Visit (HOSPITAL_COMMUNITY): Payer: Self-pay | Admitting: *Deleted

## 2022-07-07 MED ORDER — APIXABAN 5 MG PO TABS: 5.0000 mg | ORAL_TABLET | Freq: Two times a day (BID) | ORAL | 4 refills | Status: DC

## 2022-07-09 ENCOUNTER — Other Ambulatory Visit: Payer: Self-pay | Admitting: Family Medicine

## 2022-07-09 DIAGNOSIS — K2100 Gastro-esophageal reflux disease with esophagitis, without bleeding: Secondary | ICD-10-CM

## 2022-07-09 DIAGNOSIS — E782 Mixed hyperlipidemia: Secondary | ICD-10-CM

## 2022-07-10 ENCOUNTER — Other Ambulatory Visit: Payer: Self-pay

## 2022-07-10 ENCOUNTER — Encounter (HOSPITAL_COMMUNITY): Payer: Self-pay | Admitting: Emergency Medicine

## 2022-07-10 ENCOUNTER — Emergency Department (HOSPITAL_COMMUNITY): Payer: Medicare HMO

## 2022-07-10 ENCOUNTER — Inpatient Hospital Stay (HOSPITAL_COMMUNITY)
Admission: EM | Admit: 2022-07-10 | Discharge: 2022-07-19 | DRG: 308 | Disposition: A | Discharge status: Skilled Nursing Facility | Payer: Medicare HMO | Attending: Family Medicine | Admitting: Family Medicine

## 2022-07-10 DIAGNOSIS — R000 Tachycardia, unspecified: Secondary | ICD-10-CM | POA: Diagnosis not present

## 2022-07-10 DIAGNOSIS — Z1152 Encounter for screening for COVID-19: Secondary | ICD-10-CM | POA: Diagnosis not present

## 2022-07-10 DIAGNOSIS — I130 Hypertensive heart and chronic kidney disease with heart failure and stage 1 through stage 4 chronic kidney disease, or unspecified chronic kidney disease: Secondary | ICD-10-CM | POA: Diagnosis not present

## 2022-07-10 DIAGNOSIS — Z87891 Personal history of nicotine dependence: Secondary | ICD-10-CM

## 2022-07-10 DIAGNOSIS — Z9049 Acquired absence of other specified parts of digestive tract: Secondary | ICD-10-CM | POA: Diagnosis not present

## 2022-07-10 DIAGNOSIS — R609 Edema, unspecified: Secondary | ICD-10-CM | POA: Diagnosis not present

## 2022-07-10 DIAGNOSIS — G20A1 Parkinson's disease without dyskinesia, without mention of fluctuations: Secondary | ICD-10-CM | POA: Diagnosis not present

## 2022-07-10 DIAGNOSIS — I429 Cardiomyopathy, unspecified: Secondary | ICD-10-CM | POA: Diagnosis present

## 2022-07-10 DIAGNOSIS — E669 Obesity, unspecified: Secondary | ICD-10-CM | POA: Diagnosis not present

## 2022-07-10 DIAGNOSIS — R7303 Prediabetes: Secondary | ICD-10-CM | POA: Diagnosis not present

## 2022-07-10 DIAGNOSIS — N1831 Chronic kidney disease, stage 3a: Secondary | ICD-10-CM | POA: Diagnosis not present

## 2022-07-10 DIAGNOSIS — R0602 Shortness of breath: Secondary | ICD-10-CM | POA: Diagnosis not present

## 2022-07-10 DIAGNOSIS — I509 Heart failure, unspecified: Secondary | ICD-10-CM

## 2022-07-10 DIAGNOSIS — R441 Visual hallucinations: Secondary | ICD-10-CM | POA: Diagnosis not present

## 2022-07-10 DIAGNOSIS — I4891 Unspecified atrial fibrillation: Secondary | ICD-10-CM | POA: Diagnosis present

## 2022-07-10 DIAGNOSIS — I10 Essential (primary) hypertension: Secondary | ICD-10-CM | POA: Diagnosis present

## 2022-07-10 DIAGNOSIS — K219 Gastro-esophageal reflux disease without esophagitis: Secondary | ICD-10-CM | POA: Diagnosis not present

## 2022-07-10 DIAGNOSIS — R531 Weakness: Secondary | ICD-10-CM | POA: Diagnosis not present

## 2022-07-10 DIAGNOSIS — R0603 Acute respiratory distress: Secondary | ICD-10-CM | POA: Diagnosis present

## 2022-07-10 DIAGNOSIS — Z87442 Personal history of urinary calculi: Secondary | ICD-10-CM | POA: Diagnosis not present

## 2022-07-10 DIAGNOSIS — F32A Depression, unspecified: Secondary | ICD-10-CM | POA: Diagnosis not present

## 2022-07-10 DIAGNOSIS — K2100 Gastro-esophageal reflux disease with esophagitis, without bleeding: Secondary | ICD-10-CM

## 2022-07-10 DIAGNOSIS — Z888 Allergy status to other drugs, medicaments and biological substances status: Secondary | ICD-10-CM

## 2022-07-10 DIAGNOSIS — I959 Hypotension, unspecified: Secondary | ICD-10-CM | POA: Diagnosis not present

## 2022-07-10 DIAGNOSIS — I110 Hypertensive heart disease with heart failure: Secondary | ICD-10-CM | POA: Diagnosis not present

## 2022-07-10 DIAGNOSIS — I480 Paroxysmal atrial fibrillation: Secondary | ICD-10-CM | POA: Diagnosis not present

## 2022-07-10 DIAGNOSIS — J4489 Other specified chronic obstructive pulmonary disease: Secondary | ICD-10-CM | POA: Diagnosis present

## 2022-07-10 DIAGNOSIS — Z880 Allergy status to penicillin: Secondary | ICD-10-CM

## 2022-07-10 DIAGNOSIS — I5033 Acute on chronic diastolic (congestive) heart failure: Secondary | ICD-10-CM | POA: Diagnosis present

## 2022-07-10 DIAGNOSIS — Z882 Allergy status to sulfonamides status: Secondary | ICD-10-CM | POA: Diagnosis not present

## 2022-07-10 DIAGNOSIS — Z6833 Body mass index (BMI) 33.0-33.9, adult: Secondary | ICD-10-CM | POA: Diagnosis not present

## 2022-07-10 DIAGNOSIS — I493 Ventricular premature depolarization: Secondary | ICD-10-CM | POA: Diagnosis present

## 2022-07-10 DIAGNOSIS — J9811 Atelectasis: Secondary | ICD-10-CM | POA: Diagnosis not present

## 2022-07-10 DIAGNOSIS — Z8249 Family history of ischemic heart disease and other diseases of the circulatory system: Secondary | ICD-10-CM | POA: Diagnosis not present

## 2022-07-10 DIAGNOSIS — F419 Anxiety disorder, unspecified: Secondary | ICD-10-CM | POA: Diagnosis present

## 2022-07-10 DIAGNOSIS — Z7901 Long term (current) use of anticoagulants: Secondary | ICD-10-CM

## 2022-07-10 DIAGNOSIS — I5041 Acute combined systolic (congestive) and diastolic (congestive) heart failure: Secondary | ICD-10-CM | POA: Diagnosis not present

## 2022-07-10 DIAGNOSIS — Z5986 Financial insecurity: Secondary | ICD-10-CM

## 2022-07-10 DIAGNOSIS — M8580 Other specified disorders of bone density and structure, unspecified site: Secondary | ICD-10-CM | POA: Diagnosis present

## 2022-07-10 DIAGNOSIS — E785 Hyperlipidemia, unspecified: Secondary | ICD-10-CM | POA: Diagnosis not present

## 2022-07-10 DIAGNOSIS — I5043 Acute on chronic combined systolic (congestive) and diastolic (congestive) heart failure: Secondary | ICD-10-CM | POA: Diagnosis not present

## 2022-07-10 DIAGNOSIS — I4819 Other persistent atrial fibrillation: Secondary | ICD-10-CM | POA: Diagnosis not present

## 2022-07-10 DIAGNOSIS — Z743 Need for continuous supervision: Secondary | ICD-10-CM | POA: Diagnosis not present

## 2022-07-10 DIAGNOSIS — I482 Chronic atrial fibrillation, unspecified: Secondary | ICD-10-CM | POA: Diagnosis present

## 2022-07-10 DIAGNOSIS — R0989 Other specified symptoms and signs involving the circulatory and respiratory systems: Secondary | ICD-10-CM | POA: Diagnosis present

## 2022-07-10 DIAGNOSIS — R112 Nausea with vomiting, unspecified: Secondary | ICD-10-CM | POA: Diagnosis present

## 2022-07-10 DIAGNOSIS — R0609 Other forms of dyspnea: Secondary | ICD-10-CM | POA: Diagnosis not present

## 2022-07-10 DIAGNOSIS — Z825 Family history of asthma and other chronic lower respiratory diseases: Secondary | ICD-10-CM

## 2022-07-10 DIAGNOSIS — Z5941 Food insecurity: Secondary | ICD-10-CM

## 2022-07-10 DIAGNOSIS — Z79899 Other long term (current) drug therapy: Secondary | ICD-10-CM | POA: Diagnosis not present

## 2022-07-10 DIAGNOSIS — Z833 Family history of diabetes mellitus: Secondary | ICD-10-CM

## 2022-07-10 DIAGNOSIS — R079 Chest pain, unspecified: Secondary | ICD-10-CM | POA: Diagnosis not present

## 2022-07-10 LAB — CBC WITH DIFFERENTIAL/PLATELET
Abs Immature Granulocytes: 0.02 10*3/uL (ref 0.00–0.07)
Basophils Absolute: 0 10*3/uL (ref 0.0–0.1)
Basophils Relative: 0 %
Eosinophils Absolute: 0.1 10*3/uL (ref 0.0–0.5)
Eosinophils Relative: 1 %
HCT: 45.6 % (ref 36.0–46.0)
Hemoglobin: 14.4 g/dL (ref 12.0–15.0)
Immature Granulocytes: 0 %
Lymphocytes Relative: 14 %
Lymphs Abs: 0.9 10*3/uL (ref 0.7–4.0)
MCH: 30.3 pg (ref 26.0–34.0)
MCHC: 31.6 g/dL (ref 30.0–36.0)
MCV: 95.8 fL (ref 80.0–100.0)
Monocytes Absolute: 0.4 10*3/uL (ref 0.1–1.0)
Monocytes Relative: 6 %
Neutro Abs: 5.2 10*3/uL (ref 1.7–7.7)
Neutrophils Relative %: 79 %
Platelets: 171 10*3/uL (ref 150–400)
RBC: 4.76 MIL/uL (ref 3.87–5.11)
RDW: 13.6 % (ref 11.5–15.5)
WBC: 6.6 10*3/uL (ref 4.0–10.5)
nRBC: 0 % (ref 0.0–0.2)

## 2022-07-10 LAB — COMPREHENSIVE METABOLIC PANEL
ALT: 28 U/L (ref 0–44)
AST: 23 U/L (ref 15–41)
Albumin: 3.6 g/dL (ref 3.5–5.0)
Alkaline Phosphatase: 74 U/L (ref 38–126)
Anion gap: 9 (ref 5–15)
BUN: 16 mg/dL (ref 8–23)
CO2: 27 mmol/L (ref 22–32)
Calcium: 8.8 mg/dL — ABNORMAL LOW (ref 8.9–10.3)
Chloride: 104 mmol/L (ref 98–111)
Creatinine, Ser: 1 mg/dL (ref 0.44–1.00)
GFR, Estimated: 60 mL/min — ABNORMAL LOW (ref >60)
Glucose, Bld: 117 mg/dL — ABNORMAL HIGH (ref 70–99)
Potassium: 4.9 mmol/L (ref 3.5–5.1)
Sodium: 140 mmol/L (ref 135–145)
Total Bilirubin: 1 mg/dL (ref 0.3–1.2)
Total Protein: 6.4 g/dL — ABNORMAL LOW (ref 6.5–8.1)

## 2022-07-10 LAB — BRAIN NATRIURETIC PEPTIDE: B Natriuretic Peptide: 767 pg/mL — ABNORMAL HIGH (ref 0.0–100.0)

## 2022-07-10 LAB — TROPONIN I (HIGH SENSITIVITY): Troponin I (High Sensitivity): 11 ng/L (ref <18)

## 2022-07-10 MED ORDER — ONDANSETRON HCL 4 MG PO TABS: 4.0000 mg | ORAL_TABLET | Freq: Four times a day (QID) | ORAL | Status: DC | PRN

## 2022-07-10 MED ORDER — PRAMIPEXOLE DIHYDROCHLORIDE 1 MG PO TABS
0.5000 mg | ORAL_TABLET | Freq: Every day | ORAL | Status: DC
  Administered 2022-07-10 – 2022-07-18 (×9): 0.5 mg via ORAL
  Filled 2022-07-10 (×9): qty 1

## 2022-07-10 MED ORDER — APIXABAN 5 MG PO TABS
5.0000 mg | ORAL_TABLET | Freq: Two times a day (BID) | ORAL | Status: DC
  Administered 2022-07-10 – 2022-07-19 (×18): 5 mg via ORAL
  Filled 2022-07-10 (×18): qty 1

## 2022-07-10 MED ORDER — FUROSEMIDE 10 MG/ML IJ SOLN
40.0000 mg | INTRAMUSCULAR | Status: AC
  Administered 2022-07-10: 40 mg via INTRAVENOUS
  Filled 2022-07-10: qty 4

## 2022-07-10 MED ORDER — SPIRONOLACTONE 12.5 MG HALF TABLET
12.5000 mg | ORAL_TABLET | Freq: Every day | ORAL | Status: DC
  Administered 2022-07-10 – 2022-07-13 (×4): 12.5 mg via ORAL
  Filled 2022-07-10 (×5): qty 1

## 2022-07-10 MED ORDER — GABAPENTIN 300 MG PO CAPS
600.0000 mg | ORAL_CAPSULE | Freq: Three times a day (TID) | ORAL | Status: DC
  Administered 2022-07-10 – 2022-07-19 (×26): 600 mg via ORAL
  Filled 2022-07-10 (×26): qty 2

## 2022-07-10 MED ORDER — DILTIAZEM HCL-DEXTROSE 125-5 MG/125ML-% IV SOLN (PREMIX)
5.0000 mg/h | INTRAVENOUS | Status: DC
  Administered 2022-07-10: 5 mg/h via INTRAVENOUS
  Filled 2022-07-10: qty 125

## 2022-07-10 MED ORDER — ACETAMINOPHEN 325 MG PO TABS
650.0000 mg | ORAL_TABLET | Freq: Four times a day (QID) | ORAL | Status: DC | PRN
  Administered 2022-07-10: 650 mg via ORAL
  Filled 2022-07-10: qty 2

## 2022-07-10 MED ORDER — PANTOPRAZOLE SODIUM 40 MG PO TBEC
40.0000 mg | DELAYED_RELEASE_TABLET | Freq: Two times a day (BID) | ORAL | Status: DC
  Administered 2022-07-10 – 2022-07-19 (×18): 40 mg via ORAL
  Filled 2022-07-10 (×18): qty 1

## 2022-07-10 MED ORDER — METOPROLOL TARTRATE 25 MG PO TABS: 25.0000 mg | ORAL_TABLET | Freq: Two times a day (BID) | ORAL | Status: DC

## 2022-07-10 MED ORDER — ATORVASTATIN CALCIUM 40 MG PO TABS
40.0000 mg | ORAL_TABLET | Freq: Every day | ORAL | Status: DC
  Administered 2022-07-11 – 2022-07-19 (×9): 40 mg via ORAL
  Filled 2022-07-10 (×9): qty 1

## 2022-07-10 MED ORDER — ONDANSETRON HCL 4 MG/2ML IJ SOLN: 4.0000 mg | Freq: Four times a day (QID) | INTRAMUSCULAR | Status: DC | PRN

## 2022-07-10 MED ORDER — METOPROLOL TARTRATE 50 MG PO TABS
50.0000 mg | ORAL_TABLET | Freq: Two times a day (BID) | ORAL | Status: AC
  Administered 2022-07-11 – 2022-07-13 (×4): 50 mg via ORAL
  Filled 2022-07-10 (×7): qty 1

## 2022-07-10 MED ORDER — NITROGLYCERIN 0.4 MG SL SUBL: 0.4000 mg | SUBLINGUAL_TABLET | SUBLINGUAL | Status: DC | PRN

## 2022-07-10 MED ORDER — SERTRALINE HCL 50 MG PO TABS
200.0000 mg | ORAL_TABLET | Freq: Every day | ORAL | Status: DC
  Administered 2022-07-11 – 2022-07-19 (×9): 200 mg via ORAL
  Filled 2022-07-10 (×9): qty 4

## 2022-07-10 MED ORDER — ACETAMINOPHEN 650 MG RE SUPP: 650.0000 mg | Freq: Four times a day (QID) | RECTAL | Status: DC | PRN

## 2022-07-10 MED ORDER — FUROSEMIDE 10 MG/ML IJ SOLN
40.0000 mg | Freq: Two times a day (BID) | INTRAMUSCULAR | Status: DC
  Administered 2022-07-10 – 2022-07-13 (×6): 40 mg via INTRAVENOUS
  Filled 2022-07-10 (×7): qty 4

## 2022-07-10 MED ORDER — DILTIAZEM LOAD VIA INFUSION
10.0000 mg | Freq: Once | INTRAVENOUS | Status: AC
  Administered 2022-07-10: 10 mg via INTRAVENOUS
  Filled 2022-07-10: qty 10

## 2022-07-10 NOTE — Assessment & Plan Note (Addendum)
Last echocardiogram from 10/2021 with preserved LV systolic function with EF 50 to 55%, mild dilatated LV cavity. RV systolic function preserved. Mild dilatation of LA, no significant valvular disease.   Plan to continue diuresis with furosemide 40 mg IV q12 hrs. Continue with metoprolol  Add add spironolactone to augment diuresis.  Hold on SGLT 2 inh for now, reported adverse reaction "swelling" Check new echocardiogram.

## 2022-07-10 NOTE — H&P (Addendum)
History and Physical    Patient: Allison Rollins [id number] DOB: 14-May-1950 DOA: 07/10/2022 DOS: the patient was seen and examined on 07/10/2022 PCP: Gwenlyn Perking, FNP  Patient coming from: Home  Chief Complaint:  Chief Complaint  Patient presents with   Shortness of Breath   HPI: Allison Rollins is a 72 y.o. female with medical history significant of heart failure, atrial fibrillation, COPD, hypertension, and depression who presented with dyspnea and lower extremity edema. Reports 2 days of worsening dyspnea on exertion, to the point where she is having symptoms with minimal efforts. Her dyspnea has been associated with lower extremity edema, orthopnea and lower extremity edema. Positive cough and wheezing.  She had a recent ED visit on 02/07 for nausea and diarrhea. She was treated with IV fluids, antiemetics and was discharged home.  At home she has been taking diuretic therapy every other day as instructed.  She ambulated with a cane.  Because persistent symptoms she came to the ED for further evaluation.   Of note she had a ED visit on 02/02 and was diagnosed with new onset atrial fibrillation, she had RVR, but converted to sinus rhythm in the ED. She was placed on metoprolol, diltiazem and discharged home with instructions to follow up as outpatient with cardiology.  02/22 follow up with Cardiology patient remained in sinus rhythm and no medications changes were made.   In the ED she was found to have atrial fibrillation with RVR and was placed on diltiazem infusion.    Review of Systems: As mentioned in the history of present illness. All other systems reviewed and are negative. Past Medical History:  Diagnosis Date   Anxiety    Asthma    Chest pain 12/22/2015   CHF (congestive heart failure) (HCC)    COPD (chronic obstructive pulmonary disease) (HCC)    Depression    Headache    HTN (hypertension)    Hypokalemia 07/28/2013   Kidney stones    Osteopenia 01/22/2020    Parkinson's disease    Pneumonia    Pre-diabetes    S/P colonoscopy 10/15/04   normal   Vitamin D deficiency 11/24/2018   Past Surgical History:  Procedure Laterality Date   CHOLECYSTECTOMY     COLONOSCOPY  10/15/2004   Normal rectum/Normal colon   COLONOSCOPY N/A 05/10/2016   Procedure: COLONOSCOPY;  Surgeon: Danie Binder, MD;  Location[facility];  Service: Endoscopy;  Laterality: N/A;  10:30   ESOPHAGEAL MANOMETRY N/A 04/02/2019   Procedure: ESOPHAGEAL MANOMETRY (EM);  Surgeon: Mauri Pole, MD;  Location: [facility];  Service: Endoscopy;  Laterality: N/A;   ESOPHAGOGASTRODUODENOSCOPY  09/11/2010   Dysphagia likely multifactorial (possible Candida esophagitis, likely nonspecific esophageal motility disorder, and/or uncontrolled gastroesophageal reflux disease), status post empiric dilation   ESOPHAGOGASTRODUODENOSCOPY N/A 11/13/2018   Dr. Oneida Alar: No endoscopic esophageal abnormality to explain patient's dysphagia.  Esophagus dilated.  Moderate erosive/nodular gastritis with benign biopsies.   KIDNEY STONE SURGERY     SAVORY DILATION N/A 11/13/2018   Procedure: SAVORY DILATION;  Surgeon: Danie Binder, MD;  Location: [facility];  Service: Endoscopy;  Laterality: N/A;   Social History:  reports that she quit smoking about 27 years ago. Her smoking use included cigarettes. She started smoking about 57 years ago. She has a 7.50 pack-year smoking history. She has been exposed to tobacco smoke. She has never used smokeless tobacco. She reports that she does not drink alcohol and does not use drugs.  Allergies  Allergen Reactions   Farxiga [Dapagliflozin] Swelling   Penicillins Itching and Rash    Has patient had a PCN reaction causing immediate rash, facial/tongue/throat swelling, SOB or lightheadedness with hypotension: no Has patient had a PCN reaction causing severe rash involving mucus membranes or skin necrosis: no Has patient had a PCN reaction that required  hospitalization: no Has patient had a PCN reaction occurring within the last 10 years: no If all of the above answers are "NO", then may proceed with Cephalosporin use.    Sulfa Antibiotics Rash    Family History  Problem Relation Age of Onset   Coronary artery disease Father    COPD Mother    Asthma Brother    Coronary artery disease Sister    Diabetes Sister    Coronary artery disease Brother    Coronary artery disease Sister     Prior to Admission medications   Medication Sig Start Date End Date Taking? Authorizing Provider  acetaminophen (TYLENOL) 500 MG tablet Take 1 tablet (500 mg total) by mouth every 6 (six) hours as needed. 02/16/22   Ivy Lynn, NP  albuterol (PROVENTIL) (2.5 MG/3ML) 0.083% nebulizer solution Take 3 mLs (2.5 mg total) by nebulization every 6 (six) hours as needed for wheezing or shortness of breath. 01/12/18   Baruch Gouty, FNP  albuterol (VENTOLIN HFA) 108 (90 Base) MCG/ACT inhaler Inhale 2 puffs into the lungs every 6 (six) hours as needed for wheezing or shortness of breath. 08/18/20   Gwenlyn Perking, FNP  Alcohol Swabs (B-D SINGLE USE SWABS REGULAR) PADS Test sugars daily 05/24/18   Baruch Gouty, FNP  ALLERGY RELIEF CETIRIZINE 10 MG tablet Take 1 tablet by mouth every day 01/08/22   Loman Brooklyn, FNP  apixaban (ELIQUIS) 5 MG TABS tablet Take 1 tablet (5 mg total) by mouth 2 (two) times daily. 07/07/22 08/06/22  Sherran Needs, NP  Ascorbic Acid (VITAMIN C PO) Take by mouth daily. 3 tablets daily    [provider]  atorvastatin (LIPITOR) 40 MG tablet Take 1 tablet by mouth every day 07/09/22   Gwenlyn Perking, FNP  CALCIUM PO Take by mouth daily.    [provider]  cholecalciferol (VITAMIN D3) 25 MCG (1000 UT) tablet Take 1,000 Units by mouth daily.    [provider]  famotidine (PEPCID) 20 MG tablet Take 1 tablet by mouth at bedtime 07/09/22   Gwenlyn Perking, FNP  fluticasone [facility]) 50 MCG/ACT nasal spray  Use 2 sprays into each nostril every day Patient taking differently: Place 2 sprays into both nostrils daily. 05/09/22   Gwenlyn Perking, FNP  furosemide (LASIX) 20 MG tablet Take 1 tablet (20 mg total) by mouth daily as needed. 10/29/21   Loman Brooklyn, FNP  gabapentin (NEURONTIN) 300 MG capsule Take 2 capsules by mouth 3 times a day 06/02/22   Gwenlyn Perking, FNP  glucose blood (ACCU-CHEK AVIVA PLUS) test strip Test sugars daily 02/29/20   Dettinger, Fransisca Kaufmann, MD  lisinopril (ZESTRIL) 10 MG tablet Take 1 tablet by mouth every day 04/10/22   Gwenlyn Perking, FNP  metoprolol tartrate (LOPRESSOR) 25 MG tablet Take 1 tablet (25 mg total) by mouth 2 (two) times daily. 06/16/22 07/16/22  Sherran Needs, NP  nitroGLYCERIN (NITROSTAT) 0.4 MG SL tablet Place 1 tablet (0.4 mg total) under the tongue every 5 (five) minutes as needed for chest pain. 12/25/15   Eustaquio Maize, MD  pantoprazole (PROTONIX) 40 MG  tablet Take 1 tablet by mouth twice daily 04/10/22   Gwenlyn Perking, FNP  Potassium 99 MG TABS Take 1 tablet by mouth daily.    [provider]  pramipexole (MIRAPEX) 0.5 MG tablet Take 1 tablet by mouth at bedtime 06/02/22   Gwenlyn Perking, FNP  sertraline (ZOLOFT) 100 MG tablet Take 2 tablets (200 mg total) by mouth daily. 02/24/22   Gwenlyn Perking, FNP    Physical Exam: Vitals:   07/10/22 1730 07/10/22 1857 07/10/22 1900 07/10/22 1947  BP: (!) 109/93 104/64 111/80   Pulse: (!) 102 88 88   Resp: (!) 22 (!) 27 (!) 22   Temp: 98.1 F (36.7 C)   (!) 97.5 F (36.4 C)  TempSrc: Oral   Oral  SpO2: 96% 94% (!) 82%   Weight:    79.7 kg  Height:    5\' 6"  (1.676 m)   Neurology awake and alert  ENT with mild pallor Cardiovascular with S1 and S2 present, irregularly irregular with no gallops, or rubs, positive murmur at the apex, systolic.  Moderate JVD Positive lower extremity edema ++ , pitting bilaterally below the knees. Positive ecchymosis on his toes bilaterally.  Abdomen  with no distention       Data Reviewed:   Na 140, K 4,9 Cl 104 bicarbonate 27 glucose 117 bun 16 cr 1,0 BNP 767  High sensitive troponin 11, 11, 11  Wbc 6,6 hgb 14,4 plt 171   Chest radiograph with hyperinflation with cardiomegaly and bilateral hilar vascular congestion. No effusions or infiltrates.   EKG 126 bpm, normal axis, normal qtc, atrial fibrillation rhythm with no significant ST segment or T wave changes.   72 yo female with recent diagnosis of atrial fibrillation who had converted to sinus rhythm spontaneously who presents with signs and symptoms of volume overload with edema, dyspnea on exertion, PND and orthopnea. On her initial physical examination she was found hypoxemic and in atrial fibrillation with RVR.   Dx acute on chronic diastolic heart failure in the setting of atrial fibrillation with RVR.   Assessment and Plan: * Atrial fibrillation ([place]) Patient has been placed on IV diltiazem with improvement in heart rate.  Plan to continue IV diltiazem and increase metoprolol to 50 mg po bid. Wean off IV diltiazem to heart rate less than 120 bpm. Continue telemetry monitoring. Continue anticoagulation with apixaban.   Acute on chronic diastolic CHF (congestive heart failure) ([place]) Last echocardiogram from 10/2021 with preserved LV systolic function with EF 50 to 55%, mild dilatated LV cavity. RV systolic function preserved. Mild dilatation of LA, no significant valvular disease.   Plan to continue diuresis with furosemide 40 mg IV q12 hrs. Continue with metoprolol  Add add spironolactone to augment diuresis.  Hold on SGLT 2 inh for now, reported adverse reaction "swelling" Check new echocardiogram.   Essential hypertension Continue blood pressure control with metoprolol. Possible addition of ARB if blood pressure stable in the next 24 hrs.   Stage 3a chronic kidney disease ([place]) Continue diuresis with furosemide, follow up renal function and electrolytes.   Avoid hypotension.   Dyslipidemia Continue with atorvastatin.   GERD (gastroesophageal reflux disease) Continue with pantoprazole.       Advance Care Planning:   Code Status: Full Code   Consults: none   Family Communication: none  Severity of Illness: The appropriate patient status for this patient is INPATIENT. Inpatient status is judged to be reasonable and necessary in order to provide the required  intensity of service to ensure the patient's safety. The patient's presenting symptoms, physical exam findings, and initial radiographic and laboratory data in the context of their chronic comorbidities is felt to place them at high risk for further clinical deterioration. Furthermore, it is not anticipated that the patient will be medically stable for discharge from the hospital within 2 midnights of admission.   * I certify that at the point of admission it is my clinical judgment that the patient will require inpatient hospital care spanning beyond 2 midnights from the point of admission due to high intensity of service, high risk for further deterioration and high frequency of surveillance required.*  Author: Tawni Millers, MD 07/10/2022 8:01 PM  For on call review [email or web address].

## 2022-07-10 NOTE — Assessment & Plan Note (Signed)
Continue blood pressure control with metoprolol. Possible addition of ARB if blood pressure stable in the next 24 hrs.

## 2022-07-10 NOTE — ED Triage Notes (Signed)
Patient came to ED from home. Patient alert and oriented. Airway patent. Patient c/o shortness of breath. Patient has hx of CHF in which she was seen here for and recently discharged. Patient is supposed to be taking Lasix but has only taken it x3 since discharge. Per paramedic patient's legs blue in color upon their arrival. Patient had feet down on floor and has not been elevating them. Chest pain with laying flat.

## 2022-07-10 NOTE — ED Provider Notes (Addendum)
[facility] Provider Note   CSN: [id number] Arrival date & time: 07/10/22  1452     History  Chief Complaint  Patient presents with   Shortness of Breath    Allison Rollins is a 72 y.o. female.   Shortness of Breath  This patient is a 72 year old female with a history of congestive heart failure as well as a history of atrial fibrillation which appears to be paroxysmal based on the notes.  She is currently on Eliquis, furosemide to be used as needed according to the patient and metoprolol tartrate which she is taking 25 mg twice a day.  She reports to me that she has no obstructive coronary disease however she does have a history of congestive heart failure and atrial fibrillation.  She has been noticing increasing palpitations over the last couple of weeks and is now feeling increasingly short of breath.  She is very dyspneic on exertion, she has orthopnea, she has a new oxygen requirement that she is 90% on room air on arrival with shallow breath sounds and rales in the lung bases.  She has peripheral edema.  The patient was last seen on February 21 in the cardiology clinic, she follows with Dr. Harl Bowie, she has had EKGs documented several different times over the month that vary between normal sinus rhythm and A-fib.  Follows with A-fib clinic.  Last echocardiogram was performed approximately 8 months ago, ejection fraction was good at [id number], diastolic function was not able to be determined on that echocardiogram.    Home Medications Prior to Admission medications   Medication Sig Start Date End Date Taking? Authorizing Provider  acetaminophen (TYLENOL) 500 MG tablet Take 1 tablet (500 mg total) by mouth every 6 (six) hours as needed. 02/16/22   Ivy Lynn, NP  albuterol (PROVENTIL) (2.5 MG/3ML) 0.083% nebulizer solution Take 3 mLs (2.5 mg total) by nebulization every 6 (six) hours as needed for wheezing or shortness of breath. 01/12/18    Baruch Gouty, FNP  albuterol (VENTOLIN HFA) 108 (90 Base) MCG/ACT inhaler Inhale 2 puffs into the lungs every 6 (six) hours as needed for wheezing or shortness of breath. 08/18/20   Gwenlyn Perking, FNP  Alcohol Swabs (B-D SINGLE USE SWABS REGULAR) PADS Test sugars daily 05/24/18   Baruch Gouty, FNP  ALLERGY RELIEF CETIRIZINE 10 MG tablet Take 1 tablet by mouth every day 01/08/22   Loman Brooklyn, FNP  apixaban (ELIQUIS) 5 MG TABS tablet Take 1 tablet (5 mg total) by mouth 2 (two) times daily. 07/07/22 08/06/22  Sherran Needs, NP  Ascorbic Acid (VITAMIN C PO) Take by mouth daily. 3 tablets daily    [provider]  atorvastatin (LIPITOR) 40 MG tablet Take 1 tablet by mouth every day 07/09/22   Gwenlyn Perking, FNP  CALCIUM PO Take by mouth daily.    [provider]  cholecalciferol (VITAMIN D3) 25 MCG (1000 UT) tablet Take 1,000 Units by mouth daily.    [provider]  famotidine (PEPCID) 20 MG tablet Take 1 tablet by mouth at bedtime 07/09/22   Gwenlyn Perking, FNP  fluticasone [facility]) 50 MCG/ACT nasal spray Use 2 sprays into each nostril every day Patient taking differently: Place 2 sprays into both nostrils daily. 05/09/22   Gwenlyn Perking, FNP  furosemide (LASIX) 20 MG tablet Take 1 tablet (20 mg total) by mouth daily as needed. 10/29/21   Loman Brooklyn, FNP  gabapentin (NEURONTIN) 300 MG capsule Take 2 capsules by mouth 3 times a day 06/02/22   Gwenlyn Perking, FNP  glucose blood (ACCU-CHEK AVIVA PLUS) test strip Test sugars daily 02/29/20   Dettinger, Fransisca Kaufmann, MD  lisinopril (ZESTRIL) 10 MG tablet Take 1 tablet by mouth every day 04/10/22   Gwenlyn Perking, FNP  metoprolol tartrate (LOPRESSOR) 25 MG tablet Take 1 tablet (25 mg total) by mouth 2 (two) times daily. 06/16/22 07/16/22  Sherran Needs, NP  nitroGLYCERIN (NITROSTAT) 0.4 MG SL tablet Place 1 tablet (0.4 mg total) under the tongue every 5 (five) minutes as needed for chest pain. 12/25/15    Eustaquio Maize, MD  pantoprazole (PROTONIX) 40 MG tablet Take 1 tablet by mouth twice daily 04/10/22   Gwenlyn Perking, FNP  Potassium 99 MG TABS Take 1 tablet by mouth daily.    [provider]  pramipexole (MIRAPEX) 0.5 MG tablet Take 1 tablet by mouth at bedtime 06/02/22   Gwenlyn Perking, FNP  sertraline (ZOLOFT) 100 MG tablet Take 2 tablets (200 mg total) by mouth daily. 02/24/22   Gwenlyn Perking, FNP      Allergies    Wilder Glade [dapagliflozin], Penicillins, and Sulfa antibiotics    Review of Systems   Review of Systems  Respiratory:  Positive for shortness of breath.   All other systems reviewed and are negative.   Physical Exam Updated Vital Signs BP 115/88   Pulse (!) 58   Temp 98.1 F (36.7 C) (Oral)   Resp (!) 25   Ht 1.689 m (5' 6.5")   Wt 103.4 kg   SpO2 97%   BMI 36.25 kg/m  Physical Exam Vitals and nursing note reviewed.  Constitutional:      General: She is not in acute distress.    Appearance: She is well-developed.  HENT:     Head: Normocephalic and atraumatic.     Mouth/Throat:     Pharynx: No oropharyngeal exudate.  Eyes:     General: No scleral icterus.       Right eye: No discharge.        Left eye: No discharge.     Conjunctiva/sclera: Conjunctivae normal.     Pupils: Pupils are equal, round, and reactive to light.  Neck:     Thyroid: No thyromegaly.     Vascular: No JVD.  Cardiovascular:     Rate and Rhythm: Tachycardia present. Rhythm irregular.     Heart sounds: Normal heart sounds. No murmur heard.    No friction rub. No gallop.  Pulmonary:     Effort: Tachypnea and respiratory distress present.     Breath sounds: Rales present. No wheezing.  Abdominal:     General: Bowel sounds are normal. There is no distension.     Palpations: Abdomen is soft. There is no mass.     Tenderness: There is no abdominal tenderness.  Musculoskeletal:        General: No tenderness. Normal range of motion.     Cervical back: Normal range of  motion and neck supple.     Right lower leg: Edema present.     Left lower leg: Edema present.  Lymphadenopathy:     Cervical: No cervical adenopathy.  Skin:    General: Skin is warm and dry.     Findings: No erythema or rash.  Neurological:     Mental Status: She is alert.     Coordination: Coordination normal.  Psychiatric:  Behavior: Behavior normal.     ED Results / Procedures / Treatments   Labs (all labs ordered are listed, but only abnormal results are displayed) Labs Reviewed  BRAIN NATRIURETIC PEPTIDE - Abnormal; Notable for the following components:      Result Value   B Natriuretic Peptide 767.0 (*)    All other components within normal limits  COMPREHENSIVE METABOLIC PANEL - Abnormal; Notable for the following components:   Glucose, Bld 117 (*)    Calcium 8.8 (*)    Total Protein 6.4 (*)    GFR, Estimated 60 (*)    All other components within normal limits  CBC WITH DIFFERENTIAL/PLATELET  TROPONIN I (HIGH SENSITIVITY)    EKG EKG Interpretation  Date/Time:  Saturday July 10 2022 15:08:09 EDT Ventricular Rate:  126 PR Interval:  102 QRS Duration: 88 QT Interval:  325 QTC Calculation: 471 R Axis:   74 Text Interpretation: Sinus tachycardia Multiform ventricular premature complexes Anterior infarct, old Confirmed by Noemi Chapel [phone]) on 07/10/2022 3:25:03 PM  Radiology No results found.  Procedures .Critical Care  Performed by: Noemi Chapel, MD Authorized by: Noemi Chapel, MD   Critical care provider statement:    Critical care time (minutes):  45   Critical care time was exclusive of:  Separately billable procedures and treating other patients   Critical care was necessary to treat or prevent imminent or life-threatening deterioration of the following conditions:  Cardiac failure and respiratory failure   Critical care was time spent personally by me on the following activities:  Development of treatment plan with patient or surrogate,  discussions with consultants, evaluation of patient's response to treatment, examination of patient, obtaining history from patient or surrogate, review of old charts, re-evaluation of patient's condition, pulse oximetry, ordering and review of radiographic studies, ordering and review of laboratory studies and ordering and performing treatments and interventions   Care discussed with: admitting provider   Ultrasound ED Echo  Date/Time: 07/10/2022 5:12 PM  Performed by: Noemi Chapel, MD Authorized by: Noemi Chapel, MD   Procedure details:    Indications: dyspnea     Views: parasternal long axis view, parasternal short axis view and apical 4 chamber view     Images: archived     Limitations:  Body habitus, patient compliance and positioning Findings:    Pericardium: no pericardial effusion     LV Function: depressed (30 - 50%)   Impression:    Impression: decreased contractility       Medications Ordered in ED Medications  diltiazem (CARDIZEM) 1 mg/mL load via infusion 10 mg (10 mg Intravenous Bolus from Bag 07/10/22 1605)    And  diltiazem (CARDIZEM) 125 mg in dextrose 5% 125 mL (1 mg/mL) infusion (5 mg/hr Intravenous New Bag/Given 07/10/22 1600)  furosemide (LASIX) injection 40 mg (40 mg Intravenous Given 07/10/22 1549)    ED Course/ Medical Decision Making/ A&P                             Medical Decision Making Amount and/or Complexity of Data Reviewed Labs: ordered. Radiology: ordered.  Risk Prescription drug management. Decision regarding hospitalization.    This patient presents to the ED for concern of shortness of breath and chest pain, this involves an extensive number of treatment options, and is a complaint that carries with it a high risk of complications and morbidity.  The differential diagnosis includes possible coronary disease but more likely to be related  to fluid overload.  She has rales bilaterally, JVD and peripheral edema.  She is in A-fib, there is no  signs of ST elevation or depression on the EKG, less likely to be pulmonary embolism given her anticoagulated state   Co morbidities that complicate the patient evaluation  Significantly obese, hypertension, A-fib, CHF   Additional history obtained:  Additional history obtained from medical record External records from outside source obtained and reviewed including cardiology notes from the last several weeks   Lab Tests:  I Ordered, and personally interpreted labs.  The pertinent results include: BNP of 767, last checked at 261, troponin of 11, CBC normal, metabolic panel unremarkable   Imaging Studies ordered:  I ordered imaging studies including chest x-ray I independently visualized and interpreted imaging which showed cardiomegaly, some pulmonary interstitial edema likely I agree with the radiologist interpretation   Cardiac Monitoring: / EKG:  The patient was maintained on a cardiac monitor.  I personally viewed and interpreted the cardiac monitored which showed an underlying rhythm of: Atrial fibrillation with rapid ventricular rate   Consultations Obtained:  I requested consultation with the hospitalist,  and discussed lab and imaging findings as well as pertinent plan - they recommend: Admission to the hospital   Problem List / ED Course / Critical interventions / Medication management  The patient was given Lasix, also started on Cardizem, bolus and drip Reevaluation of the patient after these medicines showed that the patient slightly improved, heart rate better I have reviewed the patients home medicines and have made adjustments as needed   Social Determinants of Health:  Critically ill with A-fib with RVR, congestive heart failure and fluid retention   Test / Admission - Considered:  Will admit to the hospital, discussed with Dr. Cathlean Sauer who will come see the patient for admission          Final Clinical Impression(s) / ED Diagnoses Final  diagnoses:  Atrial fibrillation with rapid ventricular response ([place])  Acute on chronic congestive heart failure, unspecified heart failure type [facility])    Rx / DC Orders ED Discharge Orders     None         Noemi Chapel, MD 07/10/22 1712    Noemi Chapel, MD 07/10/22 1719

## 2022-07-10 NOTE — Assessment & Plan Note (Signed)
Continue diuresis with furosemide, follow up renal function and electrolytes.  Avoid hypotension.

## 2022-07-10 NOTE — Progress Notes (Signed)
Patient arrived to the unit from the ED about 6:45pm. Patient oriented to the staff and unit. CHG bath completed. MRSA swab collected. Admission video playing at this time. Patient denied pain or discomfort. Redness noted under to B/L breast and abdominal folds. Patient endorses she had a basket of meds when she  arrived to the hospital. No med s at bedside. Patient's belongings bag only have a purse, and clothing. Endorsed to oncoming RN to f/u with the ED for meds.

## 2022-07-10 NOTE — Assessment & Plan Note (Signed)
Continue with pantoprazole/.  

## 2022-07-10 NOTE — Assessment & Plan Note (Signed)
Continue with atorvastatin 

## 2022-07-10 NOTE — Assessment & Plan Note (Addendum)
-  Patient with history of atrial fibrillation presenting with A-fib with RVR -Excellent response to the use of Cardizem drip  -Currently transition to adjusted dose of oral metoprolol; Cardizem discontinued in the setting of low EF. -2D echo demonstrating global hypokinesis and ejection fraction 40-45%. -Continue treatment for CHF exacerbation and follow cardiology recommendations.   -Continue Eliquis for secondary prevention -Patient expressed no chest pain or palpitations at the moment. -Continue telemetry monitoring.

## 2022-07-10 NOTE — Progress Notes (Signed)
Spoke with patients son, Leroy Sea. Gave patients son an update on patients medical condition and plan of care for this shift. All questions answered.

## 2022-07-10 NOTE — ED Notes (Signed)
Emptied urine canister; 1,050 mL noted prior to emptying

## 2022-07-11 ENCOUNTER — Inpatient Hospital Stay (HOSPITAL_COMMUNITY): Payer: Medicare HMO

## 2022-07-11 ENCOUNTER — Other Ambulatory Visit (HOSPITAL_COMMUNITY): Payer: Self-pay | Admitting: *Deleted

## 2022-07-11 DIAGNOSIS — K2100 Gastro-esophageal reflux disease with esophagitis, without bleeding: Secondary | ICD-10-CM | POA: Diagnosis not present

## 2022-07-11 DIAGNOSIS — I4891 Unspecified atrial fibrillation: Secondary | ICD-10-CM

## 2022-07-11 DIAGNOSIS — I5033 Acute on chronic diastolic (congestive) heart failure: Secondary | ICD-10-CM | POA: Diagnosis not present

## 2022-07-11 DIAGNOSIS — I10 Essential (primary) hypertension: Secondary | ICD-10-CM | POA: Diagnosis not present

## 2022-07-11 DIAGNOSIS — R0609 Other forms of dyspnea: Secondary | ICD-10-CM | POA: Diagnosis not present

## 2022-07-11 LAB — ECHOCARDIOGRAM COMPLETE
Area-P 1/2: 3.96 cm2
Height: 66 in
S' Lateral: 4.2 cm
Weight: 2765.45 oz

## 2022-07-11 LAB — BASIC METABOLIC PANEL
Anion gap: 10 (ref 5–15)
BUN: 14 mg/dL (ref 8–23)
CO2: 28 mmol/L (ref 22–32)
Calcium: 8.7 mg/dL — ABNORMAL LOW (ref 8.9–10.3)
Chloride: 103 mmol/L (ref 98–111)
Creatinine, Ser: 0.94 mg/dL (ref 0.44–1.00)
GFR, Estimated: >60 mL/min (ref >60)
Glucose, Bld: 115 mg/dL — ABNORMAL HIGH (ref 70–99)
Potassium: 3.6 mmol/L (ref 3.5–5.1)
Sodium: 141 mmol/L (ref 135–145)

## 2022-07-11 LAB — MAGNESIUM: Magnesium: 1.9 mg/dL (ref 1.7–2.4)

## 2022-07-11 LAB — MRSA NEXT GEN BY PCR, NASAL: MRSA by PCR Next Gen: NOT DETECTED

## 2022-07-11 MED ORDER — DILTIAZEM HCL 30 MG PO TABS
60.0000 mg | ORAL_TABLET | Freq: Three times a day (TID) | ORAL | Status: DC
  Administered 2022-07-11 – 2022-07-13 (×5): 60 mg via ORAL
  Filled 2022-07-11 (×3): qty 2
  Filled 2022-07-11: qty 1
  Filled 2022-07-11 (×2): qty 2

## 2022-07-11 MED ORDER — PERFLUTREN LIPID MICROSPHERE
1.0000 mL | INTRAVENOUS | Status: AC | PRN
  Administered 2022-07-11: 3 mL via INTRAVENOUS

## 2022-07-11 MED ORDER — POTASSIUM CHLORIDE CRYS ER 20 MEQ PO TBCR
20.0000 meq | EXTENDED_RELEASE_TABLET | Freq: Once | ORAL | Status: AC
  Administered 2022-07-11: 20 meq via ORAL
  Filled 2022-07-11: qty 1

## 2022-07-11 MED ORDER — CHLORHEXIDINE GLUCONATE CLOTH 2 % EX PADS
6.0000 | MEDICATED_PAD | Freq: Every day | CUTANEOUS | Status: DC
  Administered 2022-07-11 – 2022-07-15 (×5): 6 via TOPICAL

## 2022-07-11 NOTE — Progress Notes (Signed)
Progress Note   Patient: Allison Rollins [id number] DOB: 1950/10/03 DOA: 07/10/2022     1 DOS: the patient was seen and examined on 07/11/2022   Brief hospital course: As per H&P written by Dr. Cathlean Sauer on 07/10/2022 [name] is a 72 y.o. female with medical history significant of heart failure, atrial fibrillation, COPD, hypertension, and depression who presented with dyspnea and lower extremity edema. Reports 2 days of worsening dyspnea on exertion, to the point where she is having symptoms with minimal efforts. Her dyspnea has been associated with lower extremity edema, orthopnea and lower extremity edema. Positive cough and wheezing.   She had a recent ED visit on 02/07 for nausea and diarrhea. She was treated with IV fluids, antiemetics and was discharged home.  At home she has been taking diuretic therapy every other day as instructed.  She ambulated with a cane.  Because persistent symptoms she came to the ED for further evaluation.    Of note she had a ED visit on 02/02 and was diagnosed with new onset atrial fibrillation, she had RVR, but converted to sinus rhythm in the ED. She was placed on metoprolol, diltiazem and discharged home with instructions to follow up as outpatient with cardiology.  02/22 follow up with Cardiology patient remained in sinus rhythm and no medications changes were made.    In the ED she was found to have atrial fibrillation with RVR and was placed on diltiazem infusion.   Assessment and Plan: * Atrial fibrillation ([place]) -Patient with history of atrial fibrillation presenting with A-fib with RVR -Excellent response to the use of Cardizem drip  -Currently transition to oral Cardizem and Tylenol with adjusted dose of metoprolol -Follow-up 2D echo -Continue treatment for CHF exacerbation and follow cardiology recommendation.   -Continue Eliquis for secondary prevention -Patient expressed no chest pain or palpitations at the moment.  Acute on chronic  diastolic CHF (congestive heart failure) (HCC) -Last echocardiogram from 10/2021 with preserved LV systolic function with EF 50 to 55%, mild dilatated LV cavity. RV systolic function preserved. Mild dilatation of LA, no significant valvular disease.  -Update 2D echo given exacerbation and worsening A-fib -Most likely arrhythmia mediated exacerbation -Cardiology service has been consulted -Continue IV diuresis -Continue low-sodium diet, strict intake and output and adequate hydration -Continue close monitoring of patient's electrolytes and renal function and maintain adequate control of patient's heart rate.  Essential hypertension -Currently stable -Continue to follow vital sign -Continue the use of metoprolol, Lasix and Cardizem. -Low-sodium/heart healthy diet discussed with patient.  Stage 3a chronic kidney disease (HCC) -Currently stable and at baseline -Continue close monitoring of patient electrolytes and renal function specially while receiving IV diuresis -Follow strict intake and output.  Dyslipidemia -Continue treatment with Lipitor  GERD (gastroesophageal reflux disease) -Continue PPI.  Subjective:  Afebrile, no chest pain, no nausea or vomiting.  Still short winded with activity, expressing orthopnea and using 2 L nasal cannula supplementation.  Signs of fluid overload appreciated on exam.  Physical Exam: Vitals:   07/11/22 0749 07/11/22 0800 07/11/22 0815 07/11/22 1100  BP:  (!) 107/59    Pulse: 80 65 66   Resp: 19 19 19    Temp: 97.8 F (36.6 C)   (!) 97.4 F (36.3 C)  TempSrc: Oral   Oral  SpO2: 94% (!) 88% 94%   Weight:      Height:       General exam: Alert, awake, oriented x 3; no chest pain, no nausea, no vomiting.  Reports breathing is better.  2 L nasal cannula supplementation in place.  Patient is afebrile. Respiratory system: Expressing mild orthopnea, still short winded sensation with activity.  No using accessory muscles.  Fine crackles at the  bases. Cardiovascular system: Rate controlled, no rubs, no gallops, no JVD. Gastrointestinal system: Abdomen is nondistended, soft and nontender. No organomegaly or masses felt. Normal bowel sounds heard. Central nervous system: Alert and oriented. No focal neurological deficits. Extremities: No cyanosis or clubbing appreciated; 1-2+ edema appreciated bilaterally extending up to her thighs. Skin: No petechiae. Psychiatry: Judgement and insight appear normal. Mood & affect appropriate.   Data Reviewed: Basic metabolic panel: Sodium Q000111Q, potassium 3.6, chloride 103, bicarb 28, BUN 14, creatinine 0.94 and GFR more than 60 Magnesium: 1.9  Family Communication: No family at bedside at time of evaluation; patient expressed that she will call and update them personally.  Disposition: Status is: Inpatient Remains inpatient appropriate because: Seen IV diuresis, close monitoring of patient's heart rate/breathing and further adjust rate controlling agents. Cardiology to see patient in am.    Planned Discharge Destination: Home   Time spent: 50 minutes  Author: Barton Dubois, MD 07/11/2022 11:24 AM  For on call review [email or web address].

## 2022-07-11 NOTE — Progress Notes (Signed)
*  PRELIMINARY RESULTS* Echocardiogram 2D Echocardiogram has been performed with [facility].  Samuel Germany 07/11/2022, 1:49 PM

## 2022-07-12 DIAGNOSIS — I5033 Acute on chronic diastolic (congestive) heart failure: Secondary | ICD-10-CM | POA: Diagnosis not present

## 2022-07-12 DIAGNOSIS — K2100 Gastro-esophageal reflux disease with esophagitis, without bleeding: Secondary | ICD-10-CM | POA: Diagnosis not present

## 2022-07-12 DIAGNOSIS — I10 Essential (primary) hypertension: Secondary | ICD-10-CM | POA: Diagnosis not present

## 2022-07-12 DIAGNOSIS — I4891 Unspecified atrial fibrillation: Secondary | ICD-10-CM | POA: Diagnosis not present

## 2022-07-12 LAB — BASIC METABOLIC PANEL
Anion gap: 9 (ref 5–15)
BUN: 17 mg/dL (ref 8–23)
CO2: 31 mmol/L (ref 22–32)
Calcium: 8.7 mg/dL — ABNORMAL LOW (ref 8.9–10.3)
Chloride: 100 mmol/L (ref 98–111)
Creatinine, Ser: 1.15 mg/dL — ABNORMAL HIGH (ref 0.44–1.00)
GFR, Estimated: 51 mL/min — ABNORMAL LOW (ref >60)
Glucose, Bld: 110 mg/dL — ABNORMAL HIGH (ref 70–99)
Potassium: 4 mmol/L (ref 3.5–5.1)
Sodium: 140 mmol/L (ref 135–145)

## 2022-07-12 NOTE — Progress Notes (Signed)
   07/12/22 1600  ReDS Vest / Clip  Station Marker B  Ruler Value 46  ReDS Value Range  (low quality)  ReDS Actual Value  (low quality)

## 2022-07-12 NOTE — TOC Progression Note (Signed)
Transition of Care [facility]) - Progression Note    Patient Details  Name: Allison Rollins MRN: [id number] Date of Birth: Oct 14, 1950  Transition of Care [facility]) CM/SW Contact  Salome Arnt, [place] Phone Number: 07/12/2022, 9:49 AM  Clinical Narrative:   Transition of Care [facility]) Screening Note   Patient Details  Name: Allison Rollins Date of Birth: 05-Aug-1950   Transition of Care [facility]) CM/SW Contact:    Salome Arnt, [place] Phone Number: 07/12/2022, 9:49 AM    Transition of Care Department [facility]) has reviewed patient and no TOC needs have been identified at this time. We will continue to monitor patient advancement through interdisciplinary progression rounds. If new patient transition needs arise, please place a TOC consult.            Expected Discharge Plan and Services                                               Social Determinants of Health (SDOH) Interventions SDOH Screenings   Food Insecurity: No Food Insecurity (04/16/2022)  Recent Concern: [place] Present (02/24/2022)  Housing: Low Risk  (04/16/2022)  Transportation Needs: No Transportation Needs (04/16/2022)  Utilities: Not At Risk (04/16/2022)  Recent Concern: Utilities - At Risk (02/24/2022)  Alcohol Screen: Low Risk  (04/16/2022)  Depression (PHQ2-9): Low Risk  (04/16/2022)  Recent Concern: Depression (PHQ2-9) - High Risk (02/24/2022)  Financial Resource Strain: Medium Risk (04/16/2022)  Physical Activity: Insufficiently Active (04/16/2022)  Social Connections: Moderately Integrated (04/16/2022)  Recent Concern: Social Connections - Socially Isolated (03/24/2022)  Stress: No Stress Concern Present (04/16/2022)  Tobacco Use: Medium Risk (07/10/2022)    Readmission Risk Interventions     No data to display

## 2022-07-12 NOTE — Progress Notes (Signed)
Progress Note   Patient: Allison Rollins [id number] DOB: 1950/10/02 DOA: 07/10/2022     2 DOS: the patient was seen and examined on 07/12/2022   Brief hospital course: As per H&P written by Dr. Cathlean Sauer on 07/10/2022 Allison Rollins is a 72 y.o. female with medical history significant of heart failure, atrial fibrillation, COPD, hypertension, and depression who presented with dyspnea and lower extremity edema. Reports 2 days of worsening dyspnea on exertion, to the point where she is having symptoms with minimal efforts. Her dyspnea has been associated with lower extremity edema, orthopnea and lower extremity edema. Positive cough and wheezing.   She had a recent ED visit on 02/07 for nausea and diarrhea. She was treated with IV fluids, antiemetics and was discharged home.  At home she has been taking diuretic therapy every other day as instructed.  She ambulated with a cane.  Because persistent symptoms she came to the ED for further evaluation.    Of note she had a ED visit on 02/02 and was diagnosed with new onset atrial fibrillation, she had RVR, but converted to sinus rhythm in the ED. She was placed on metoprolol, diltiazem and discharged home with instructions to follow up as outpatient with cardiology.  02/22 follow up with Cardiology patient remained in sinus rhythm and no medications changes were made.    In the ED she was found to have atrial fibrillation with RVR and was placed on diltiazem infusion.   Assessment and Plan: * Atrial fibrillation ([place]) -Patient with history of atrial fibrillation presenting with A-fib with RVR -Excellent response to the use of Cardizem drip  -Currently transition to oral Cardizem and Tylenol with adjusted dose of metoprolol -2D echo demonstrating global hypokinesis and ejection fraction 40-45%. -Continue treatment for CHF exacerbation and follow cardiology recommendations.   -Continue Eliquis for secondary prevention -Patient expressed no chest pain  or palpitations at the moment.  Acute on chronic diastolic CHF (congestive heart failure) (HCC) -Last echocardiogram from 10/2021 with preserved LV systolic function with EF 50 to 55%, mild dilatated LV cavity. RV systolic function preserved. Mild dilatation of LA, no significant valvular disease.  -Update 2D echo demonstrating ejection fraction 40 to 45% with global hypokinesis; will follow cardiology service recommendation. -CHF exacerbation most likely arrhythmia mediated exacerbation -Continue IV diuresis -Continue low-sodium diet, strict intake and output and adequate hydration -Continue close monitoring of patient's electrolytes and renal function and maintain adequate control of patient's heart rate.  Essential hypertension -Currently stable -Continue to follow vital sign -Continue the use of metoprolol, Lasix and Cardizem. -Low-sodium/heart healthy diet discussed with patient.  Stage 3a chronic kidney disease (HCC) -Currently stable and at baseline -Continue close monitoring of patient electrolytes and renal function specially while receiving IV diuresis -Follow strict intake and output.  Dyslipidemia -Continue treatment with Lipitor  GERD (gastroesophageal reflux disease) -Continue PPI.  Overweight/Class 1 obesity -Body mass index is 27.9 kg/m. -low calorie diet and portion control discussed with patient.    Subjective:  No fever, no chest pain, no nausea or vomiting.  Reports feeling short winded and having mild orthopnea.  Patient expressed intermittent nonproductive coughing spells.   Physical Exam: Vitals:   07/11/22 2130 07/12/22 0524 07/12/22 0846 07/12/22 1336  BP: (!) 95/58 102/70 (!) 115/97 (!) 121/96  Pulse: 61 77 91 88  Resp: 20  18 18   Temp: 97.9 F (36.6 C) 97.7 F (36.5 C) 98.3 F (36.8 C) 98.5 F (36.9 C)  TempSrc: Oral Oral Oral Oral  SpO2: 96% 92% 93% 95%  Weight:      Height:       General exam: Alert, awake, oriented x 3; no chest pain,  no nausea, no vomiting.  Expressing mild orthopnea and intermittent coughing spells.  Patient is afebrile. Respiratory system: No using accessory muscles; decreased breath sounds at the bases, positive rhonchi. Cardiovascular system:RRR. No rubs or gallops; no JVD. Gastrointestinal system: Abdomen is obese, nondistended, soft and nontender. No organomegaly or masses felt. Normal bowel sounds heard. Central nervous system: Alert and oriented. No focal neurological deficits. Extremities: No cyanosis or clubbing; 1+ edema appreciated bilaterally. Skin: No petechiae. Psychiatry: Judgement and insight appear normal. Mood & affect appropriate.   Data Reviewed: 2D echo: Ejection fraction 40 to 45%, global hypokinesis and unable to determine diastolic parameters. Reds clip measurement: Elevated, but not accurate due to body habitus. Basic metabolic panel: Sodium XX123456, potassium 4.0, chloride 100, bicarb 31, BUN 17, creatinine 1.15, GFR 51.   Family Communication: No family at bedside at time of evaluation; patient expressed that she will call and update them personally.  Disposition: Status is: Inpatient Remains inpatient appropriate because: Seen IV diuresis, close monitoring of patient's heart rate/breathing and further adjust rate controlling agents. Cardiology to see patient in am.    Planned Discharge Destination: Home  Time spent: 50 minutes  Author: Barton Dubois, MD 07/12/2022 6:18 PM  For on call review [email or web address].

## 2022-07-13 DIAGNOSIS — I4891 Unspecified atrial fibrillation: Secondary | ICD-10-CM | POA: Diagnosis not present

## 2022-07-13 DIAGNOSIS — I5041 Acute combined systolic (congestive) and diastolic (congestive) heart failure: Secondary | ICD-10-CM | POA: Diagnosis not present

## 2022-07-13 DIAGNOSIS — K2100 Gastro-esophageal reflux disease with esophagitis, without bleeding: Secondary | ICD-10-CM | POA: Diagnosis not present

## 2022-07-13 DIAGNOSIS — I4819 Other persistent atrial fibrillation: Secondary | ICD-10-CM

## 2022-07-13 DIAGNOSIS — I5033 Acute on chronic diastolic (congestive) heart failure: Secondary | ICD-10-CM | POA: Diagnosis not present

## 2022-07-13 DIAGNOSIS — I10 Essential (primary) hypertension: Secondary | ICD-10-CM | POA: Diagnosis not present

## 2022-07-13 LAB — BASIC METABOLIC PANEL
Anion gap: 11 (ref 5–15)
BUN: 18 mg/dL (ref 8–23)
CO2: 33 mmol/L — ABNORMAL HIGH (ref 22–32)
Calcium: 8.8 mg/dL — ABNORMAL LOW (ref 8.9–10.3)
Chloride: 95 mmol/L — ABNORMAL LOW (ref 98–111)
Creatinine, Ser: 1.07 mg/dL — ABNORMAL HIGH (ref 0.44–1.00)
GFR, Estimated: 55 mL/min — ABNORMAL LOW (ref >60)
Glucose, Bld: 123 mg/dL — ABNORMAL HIGH (ref 70–99)
Potassium: 3.5 mmol/L (ref 3.5–5.1)
Sodium: 139 mmol/L (ref 135–145)

## 2022-07-13 MED ORDER — ALUM & MAG HYDROXIDE-SIMETH 200-200-20 MG/5ML PO SUSP
30.0000 mL | Freq: Four times a day (QID) | ORAL | Status: DC | PRN
  Administered 2022-07-13: 30 mL via ORAL
  Filled 2022-07-13: qty 30

## 2022-07-13 NOTE — Progress Notes (Signed)
Progress Note   Patient: Allison Rollins [id number] DOB: 11/29/1950 DOA: 07/10/2022     3 DOS: the patient was seen and examined on 07/13/2022   Brief hospital course: As per H&P written by Dr. Cathlean Sauer on 07/10/2022 Allison Rollins is a 72 y.o. female with medical history significant of heart failure, atrial fibrillation, COPD, hypertension, and depression who presented with dyspnea and lower extremity edema. Reports 2 days of worsening dyspnea on exertion, to the point where she is having symptoms with minimal efforts. Her dyspnea has been associated with lower extremity edema, orthopnea and lower extremity edema. Positive cough and wheezing.   She had a recent ED visit on 02/07 for nausea and diarrhea. She was treated with IV fluids, antiemetics and was discharged home.  At home she has been taking diuretic therapy every other day as instructed.  She ambulated with a cane.  Because persistent symptoms she came to the ED for further evaluation.    Of note she had a ED visit on 02/02 and was diagnosed with new onset atrial fibrillation, she had RVR, but converted to sinus rhythm in the ED. She was placed on metoprolol, diltiazem and discharged home with instructions to follow up as outpatient with cardiology.  02/22 follow up with Cardiology patient remained in sinus rhythm and no medications changes were made.    In the ED she was found to have atrial fibrillation with RVR and was placed on diltiazem infusion.   Assessment and Plan: * Atrial fibrillation ([place]) -Patient with history of atrial fibrillation presenting with A-fib with RVR -Excellent response to the use of Cardizem drip  -Currently transition to adjusted dose of oral metoprolol; Cardizem discontinued in the setting of low EF. -2D echo demonstrating global hypokinesis and ejection fraction 40-45%. -Continue treatment for CHF exacerbation and follow cardiology recommendations.   -Continue Eliquis for secondary prevention -Patient  expressed no chest pain or palpitations at the moment. -Continue telemetry monitoring.  Acute on chronic diastolic CHF (congestive heart failure) (HCC) -Last echocardiogram from 10/2021 with preserved LV systolic function with EF 50 to 55%, mild dilatated LV cavity. RV systolic function preserved. Mild dilatation of LA, no significant valvular disease.  -Update 2D echo demonstrating ejection fraction 40 to 45% with global hypokinesis; will follow cardiology service recommendation. -CHF exacerbation most likely arrhythmia mediated exacerbation -Continue IV diuresis, adjusted dose of metoprolol and continue the use of spironolactone. -Continue low-sodium diet, strict intake and output and adequate hydration -Continue close monitoring of patient's electrolytes and renal function and maintain adequate control of patient's heart rate.  Essential hypertension -Currently stable -Continue to follow vital sign -Continue the use of metoprolol, spironolactone and Lasix. -Low-sodium/heart healthy diet discussed with patient.  Stage 3a chronic kidney disease (HCC) -Currently stable and at baseline -Continue close monitoring of patient electrolytes and renal function specially while receiving IV diuresis -Follow strict intake and output.  Dyslipidemia -Continue treatment with Lipitor  GERD (gastroesophageal reflux disease) -Continue PPI. -also PRN antiemetics and maalox ordered.   Overweight/Class 1 obesity -Body mass index is 27.9 kg/m. -low calorie diet and portion control discussed with patient.  Subjective:  Present intermittent coughing spells with mild flank production; no chest pain, no vomiting.  Patient was nauseated earlier today.  Overall expressed feeling better and breathing easier, but still short winded with activity, using 1-2 L nasal cannula supplementation and reporting mild orthopnea.  Physical Exam: Vitals:   07/12/22 1336 07/12/22 1947 07/13/22 0414 07/13/22 0800  BP:  (!) 121/96 102/67  99/71 108/73  Pulse: 88 71 66 72  Resp: 18 18 16    Temp: 98.5 F (36.9 C) 98.1 F (36.7 C) 97.7 F (36.5 C)   TempSrc: Oral  Oral   SpO2: 95% 92% 91% 94%  Weight:      Height:       General exam: Alert, awake, oriented x 3; using 1-2 L nasal cannula supplementation to maintain saturation.  Still expressing feeling short winded with activity and having mild orthopnea.  Overall improve in lower extremity swelling and express feeling better.  Patient with some nausea this morning. Respiratory system: Decreased breath sounds at the bases, no wheezing, no rhonchi.  No frank crackles. Cardiovascular system: Rate controlled, no rubs, no gallops, no JVD on exam. Gastrointestinal system: Abdomen is nondistended, soft and nontender. No organomegaly or masses felt. Normal bowel sounds heard. Central nervous system: Alert and oriented. No focal neurological deficits. Extremities: No cyanosis or clubbing; 1+ edema appreciated bilaterally. Skin: No petechiae. Psychiatry: Judgement and insight appear normal. Mood & affect appropriate.   Data Reviewed: 2D echo: Ejection fraction 40 to 45%, global hypokinesis and unable to determine diastolic parameters. Reds clip measurement: Elevated, but not accurate due to body habitus. Basic metabolic panel: Sodium XX123456, potassium 3.5, chloride 95, bicarb 33, BUN 18, creatinine 1.07 and GFR 55.   Family Communication: No family at bedside at time of evaluation; patient expressed that she will call and update them personally.  Disposition: Status is: Inpatient Remains inpatient appropriate because: Seen IV diuresis, close monitoring of patient's heart rate/breathing and further adjust rate controlling agents. Cardiology to see patient in am.    Planned Discharge Destination: Home  Time spent: 35 minutes  Author: Barton Dubois, MD 07/13/2022 3:24 PM  For on call review [email or web address].

## 2022-07-13 NOTE — Consult Note (Signed)
Cardiology Consultation   Patient ID: Allison Rollins MRN: [id number]; DOB: [birth year]  Admit date: 07/10/2022 Date of Consult: 07/13/2022  PCP:  Gwenlyn Perking, [facility] [facility] Cardiologist:  Carlyle Dolly, MD        Patient Profile:   Allison Rollins is a 72 y.o. female with a hx of HTN, chronic diastolic CHF, chronic bradycardia,  recent diagnosis Afib, COPD who is being seen 07/13/2022 for the evaluation of Afib with RVR and CHF at the request of Dr. Dyann Kief.  History of Present Illness:   Allison Rollins with above PMHx last saw Dr. Harl Bowie 03/2022 and stable. She then was seen in Afib clinic for recent diagnosis but was in NSR that day and on Eliquis and metoprolol 25 mg bid.  Patient comes to ED with worsening DOE, LE edema, cough, wheezing and palpitations and found to be in Afib with RVR. Echo 07/11/22 difficult study EF 40-45% down from 10/2021. Patient says she stopped eliquis for 2 days because she thought it made her feel funny. She is feeling much better and breathing back to baseline. Patient says she used to weight 237 and is 172 lbs today.   Past Medical History:  Diagnosis Date   Anxiety    Asthma    Chest pain 12/22/2015   CHF (congestive heart failure) (HCC)    COPD (chronic obstructive pulmonary disease) (HCC)    Depression    Headache    HTN (hypertension)    Hypokalemia 07/28/2013   Kidney stones    Osteopenia 01/22/2020   Parkinson's disease    Pneumonia    Pre-diabetes    S/P colonoscopy 10/15/04   normal   Vitamin D deficiency 11/24/2018    Past Surgical History:  Procedure Laterality Date   CHOLECYSTECTOMY     COLONOSCOPY  10/15/2004   Normal rectum/Normal colon   COLONOSCOPY N/A 05/10/2016   Procedure: COLONOSCOPY;  Surgeon: Danie Binder, MD;  Location: [facility];  Service: Endoscopy;  Laterality: N/A;  10:30   ESOPHAGEAL MANOMETRY N/A 04/02/2019   Procedure: ESOPHAGEAL MANOMETRY (EM);  Surgeon: Mauri Pole, MD;   Location: [facility];  Service: Endoscopy;  Laterality: N/A;   ESOPHAGOGASTRODUODENOSCOPY  09/11/2010   Dysphagia likely multifactorial (possible Candida esophagitis, likely nonspecific esophageal motility disorder, and/or uncontrolled gastroesophageal reflux disease), status post empiric dilation   ESOPHAGOGASTRODUODENOSCOPY N/A 11/13/2018   Dr. Oneida Alar: No endoscopic esophageal abnormality to explain patient's dysphagia.  Esophagus dilated.  Moderate erosive/nodular gastritis with benign biopsies.   KIDNEY STONE SURGERY     SAVORY DILATION N/A 11/13/2018   Procedure: SAVORY DILATION;  Surgeon: Danie Binder, MD;  Location: [facility];  Service: Endoscopy;  Laterality: N/A;     Home Medications:  Prior to Admission medications   Medication Sig Start Date End Date Taking? Authorizing Provider  acetaminophen (TYLENOL) 500 MG tablet Take 1 tablet (500 mg total) by mouth every 6 (six) hours as needed. 02/16/22  Yes Ivy Lynn, NP  albuterol (PROVENTIL) (2.5 MG/3ML) 0.083% nebulizer solution Take 3 mLs (2.5 mg total) by nebulization every 6 (six) hours as needed for wheezing or shortness of breath. 01/12/18  Yes Rakes, Connye Burkitt, FNP  albuterol (VENTOLIN HFA) 108 (90 Base) MCG/ACT inhaler Inhale 2 puffs into the lungs every 6 (six) hours as needed for wheezing or shortness of breath. 08/18/20  Yes Gwenlyn Perking, FNP  ALLERGY RELIEF CETIRIZINE 10 MG tablet Take 1 tablet by mouth every day  01/08/22  Yes Loman Brooklyn, FNP  apixaban (ELIQUIS) 5 MG TABS tablet Take 1 tablet (5 mg total) by mouth 2 (two) times daily. 07/07/22 08/06/22 Yes Sherran Needs, NP  Ascorbic Acid (VITAMIN C PO) Take 1 tablet by mouth 3 (three) times daily.   Yes [provider]  atorvastatin (LIPITOR) 40 MG tablet Take 1 tablet by mouth every day 07/09/22  Yes Gwenlyn Perking, FNP  CALCIUM PO Take 1 tablet by mouth daily.   Yes [provider]  cholecalciferol (VITAMIN D3) 25 MCG (1000 UT) tablet  Take 1,000 Units by mouth daily.   Yes [provider]  famotidine (PEPCID) 20 MG tablet Take 1 tablet by mouth at bedtime 07/09/22  Yes Gwenlyn Perking, FNP  fluticasone [facility]) 50 MCG/ACT nasal spray Use 2 sprays into each nostril every day Patient taking differently: Place 2 sprays into both nostrils daily. 05/09/22  Yes Gwenlyn Perking, FNP  furosemide (LASIX) 20 MG tablet Take 1 tablet (20 mg total) by mouth daily as needed. 10/29/21  Yes [name] F, FNP  gabapentin (NEURONTIN) 300 MG capsule Take 2 capsules by mouth 3 times a day 06/02/22  Yes Gwenlyn Perking, FNP  lisinopril (ZESTRIL) 10 MG tablet Take 1 tablet by mouth every day 04/10/22  Yes Gwenlyn Perking, FNP  metoprolol tartrate (LOPRESSOR) 25 MG tablet Take 1 tablet (25 mg total) by mouth 2 (two) times daily. 06/16/22 07/16/22 Yes Sherran Needs, NP  pantoprazole (PROTONIX) 40 MG tablet Take 1 tablet by mouth twice daily 04/10/22  Yes Gwenlyn Perking, FNP  Potassium 99 MG TABS Take 1 tablet by mouth daily.   Yes [provider]  pramipexole (MIRAPEX) 0.5 MG tablet Take 1 tablet by mouth at bedtime 06/02/22  Yes Gwenlyn Perking, FNP  sertraline (ZOLOFT) 100 MG tablet Take 2 tablets (200 mg total) by mouth daily. 02/24/22  Yes Gwenlyn Perking, FNP  Alcohol Swabs (B-D SINGLE USE SWABS REGULAR) PADS Test sugars daily 05/24/18   Baruch Gouty, FNP  glucose blood (ACCU-CHEK AVIVA PLUS) test strip Test sugars daily 02/29/20   Dettinger, Fransisca Kaufmann, MD  nitroGLYCERIN (NITROSTAT) 0.4 MG SL tablet Place 1 tablet (0.4 mg total) under the tongue every 5 (five) minutes as needed for chest pain. 12/25/15   Eustaquio Maize, MD    Inpatient Medications: Scheduled Meds:  apixaban  5 mg Oral BID   atorvastatin  40 mg Oral Daily   Chlorhexidine Gluconate Cloth  6 each Topical Daily   diltiazem  60 mg Oral Q8H   furosemide  40 mg Intravenous Q12H   gabapentin  600 mg Oral TID   metoprolol tartrate  50 mg Oral BID    pantoprazole  40 mg Oral BID   pramipexole  0.5 mg Oral QHS   sertraline  200 mg Oral Daily   spironolactone  12.5 mg Oral Daily   Continuous Infusions:  PRN Meds: acetaminophen **OR** acetaminophen, nitroGLYCERIN, ondansetron **OR** ondansetron (ZOFRAN) IV  Allergies:    Allergies  Allergen Reactions   Farxiga [Dapagliflozin] Swelling   Penicillins Itching and Rash    Has patient had a PCN reaction causing immediate rash, facial/tongue/throat swelling, SOB or lightheadedness with hypotension: no Has patient had a PCN reaction causing severe rash involving mucus membranes or skin necrosis: no Has patient had a PCN reaction that required hospitalization: no Has patient had a PCN reaction occurring within the last 10 years: no If all of the  above answers are "NO", then may proceed with Cephalosporin use.    Sulfa Antibiotics Rash    Social History:   Social History   Socioeconomic History   Marital status: Widowed    Spouse name: Not on file   Number of children: 2   Years of education: 81   Highest education level: 12th grade  Occupational History   Occupation: disabled  Tobacco Use   Smoking status: Former    Packs/day: 0.25    Years: 30.00    Additional pack years: 0.00    Total pack years: 7.50    Types: Cigarettes    Start date: 01/04/1965    Quit date: 01/03/1995    Years since quitting: 27.5    Passive exposure: Past   Smokeless tobacco: Never  Vaping Use   Vaping Use: Never used  Substance and Sexual Activity   Alcohol use: No    Alcohol/week: 0.0 standard drinks of alcohol   Drug use: No   Sexual activity: Not Currently    Partners: Male  Other Topics Concern   Not on file  Social History Narrative   Lives alone. No stairs. Has ramps. Handicap bathroom   Social Determinants of Health   Financial Resource Strain: Medium Risk (04/16/2022)   Overall Financial Resource Strain (CARDIA)    Difficulty of Paying Living Expenses: Somewhat hard  Food  Insecurity: No Food Insecurity (04/16/2022)   Hunger Vital Sign    Worried About Running Out of Food in the Last Year: Never true    Ran Out of Food in the Last Year: Never true  Recent Concern: Food Insecurity - Food Insecurity Present (02/24/2022)   Hunger Vital Sign    Worried About Running Out of Food in the Last Year: Often true    Ran Out of Food in the Last Year: Often true  Transportation Needs: No Transportation Needs (04/16/2022)   PRAPARE - Hydrologist (Medical): No    Lack of Transportation (Non-Medical): No  Physical Activity: Insufficiently Active (04/16/2022)   Exercise Vital Sign    Days of Exercise per Week: 3 days    Minutes of Exercise per Session: 30 min  Stress: No Stress Concern Present (04/16/2022)   [place]    Feeling of Stress : Not at all  Social Connections: Moderately Integrated (04/16/2022)   Social Connection and Isolation Panel [NHANES]    Frequency of Communication with Friends and Family: More than three times a week    Frequency of Social Gatherings with Friends and Family: More than three times a week    Attends Religious Services: More than 4 times per year    Active Member of [employer] or Organizations: Yes    Attends Archivist Meetings: More than 4 times per year    Marital Status: Widowed  Recent Concern: Social Connections - Socially Isolated (03/24/2022)   Social Connection and Isolation Panel [NHANES]    Frequency of Communication with Friends and Family: More than three times a week    Frequency of Social Gatherings with Friends and Family: More than three times a week    Attends Religious Services: Never    Marine scientist or Organizations: No    Attends Archivist Meetings: Never    Marital Status: Widowed  Intimate Partner Violence: Not At Risk (04/16/2022)   Humiliation, Afraid, Rape, and Kick questionnaire    Fear  of Current or Ex-Partner:  No    Emotionally Abused: No    Physically Abused: No    Sexually Abused: No    Family History:     Family History  Problem Relation Age of Onset   Coronary artery disease Father    COPD Mother    Asthma Brother    Coronary artery disease Sister    Diabetes Sister    Coronary artery disease Brother    Coronary artery disease Sister      ROS:  Please see the history of present illness.  Review of Systems  Constitutional: Positive for decreased appetite and weight loss.  Cardiovascular:  Positive for dyspnea on exertion, irregular heartbeat, leg swelling, orthopnea and palpitations.  Respiratory:  Positive for cough, shortness of breath, sleep disturbances due to breathing and wheezing.   Gastrointestinal:  Positive for nausea and vomiting.  Neurological:  Positive for weakness.    All other ROS reviewed and negative.     Physical Exam/Data:   Vitals:   07/12/22 0846 07/12/22 1336 07/12/22 1947 07/13/22 0414  BP: (!) 115/97 (!) 121/96 102/67 99/71  Pulse: 91 88 71 66  Resp: 18 18 18 16   Temp: 98.3 F (36.8 C) 98.5 F (36.9 C) 98.1 F (36.7 C) 97.7 F (36.5 C)  TempSrc: Oral Oral  Oral  SpO2: 93% 95% 92% 91%  Weight:      Height:        Intake/Output Summary (Last 24 hours) at 07/13/2022 0833 Last data filed at 07/13/2022 [id number] Gross per 24 hour  Intake 960 ml  Output 1850 ml  Net -890 ml      07/11/2022    4:26 AM 07/10/2022    7:47 PM 07/10/2022    3:07 PM  Last 3 Weights  Weight (lbs) 172 lb 13.5 oz 175 lb 11.3 oz 228 lb  Weight (kg) 78.4 kg 79.7 kg 103.42 kg     Body mass index is 27.9 kg/m.  General:  Well nourished, well developed, in no acute distress  HEENT: normal Neck: no JVD Vascular: No carotid bruits; Distal pulses 2+ bilaterally Cardiac:  normal S1, S2; irreg irreg; no murmur   Lungs:  clear to auscultation bilaterally, no wheezing, rhonchi or rales  Abd: soft, nontender, no hepatomegaly  Ext: no  edema Musculoskeletal:  No deformities, BUE and BLE strength normal and equal Skin: warm and dry  Neuro:  CNs 2-12 intact, no focal abnormalities noted Psych:  Normal affect   EKG:  The EKG was personally reviewed and demonstrates:  Afib 126/m. Poor ant R wave progression Telemetry:  Telemetry was personally reviewed and demonstrates:  Afib, rate controlled  Relevant CV Studies:   11/2015  Nuclear stress There was no ST segment deviation noted during stress. No T wave inversion was noted during stress. Findings consistent with prior myocardial infarction. This is a low risk study. The left ventricular ejection fraction is mildly decreased (45-54%).   Small inferolateral wall infarct at mid and basal level EF estimated 50% but looks normal no ischemia     10/2021 echo IMPRESSIONS     1. Left ventricular ejection fraction, by estimation, is 50 to 55%. The  left ventricle has low normal function. The left ventricle has no regional  wall motion abnormalities. The left ventricular internal cavity size was  mildly dilated. Left ventricular  diastolic parameters are indeterminate. Elevated left atrial pressure.   2. Right ventricular systolic function is normal. The right ventricular  size is normal. Tricuspid regurgitation signal is inadequate  for assessing  PA pressure.   3. Left atrial size was mildly dilated.   4. The mitral valve is abnormal. Mild to moderate mitral valve  regurgitation. No evidence of mitral stenosis.   5. The aortic valve has an indeterminant number of cusps. Aortic valve  regurgitation is not visualized. No aortic stenosis is present.   6. Aortic dilatation noted. There is mild dilatation of the ascending  aorta, measuring 36 mm.   7. The inferior vena cava is normal in size with greater than 50%  respiratory variability, suggesting right atrial pressure of 3 mmHg.      Laboratory Data:  High Sensitivity Troponin:   Recent Labs  Lab 07/10/22 1550   TROPONINIHS 11     Chemistry Recent Labs  Lab 07/11/22 0355 07/12/22 0339 07/13/22 0458  NA 141 140 139  K 3.6 4.0 3.5  CL 103 100 95*  CO2 28 31 33*  GLUCOSE 115* 110* 123*  BUN 14 17 18   CREATININE 0.94 1.15* 1.07*  CALCIUM 8.7* 8.7* 8.8*  MG 1.9  --   --   GFRNONAA >60 51* 55*  ANIONGAP 10 9 11     Recent Labs  Lab 07/10/22 1550  PROT 6.4*  ALBUMIN 3.6  AST 23  ALT 28  ALKPHOS 74  BILITOT 1.0   Lipids No results for input(s): "CHOL", "TRIG", "HDL", "LABVLDL", "LDLCALC", "CHOLHDL" in the last 168 hours.  Hematology Recent Labs  Lab 07/10/22 1550  WBC 6.6  RBC 4.76  HGB 14.4  HCT 45.6  MCV 95.8  MCH 30.3  MCHC 31.6  RDW 13.6  PLT 171   Thyroid No results for input(s): "TSH", "FREET4" in the last 168 hours.  BNP Recent Labs  Lab 07/10/22 1550  BNP 767.0*    DDimer No results for input(s): "DDIMER" in the last 168 hours.   Radiology/Studies:  ECHOCARDIOGRAM COMPLETE  Result Date: 07/11/2022    ECHOCARDIOGRAM REPORT   Patient Name:   ARWILDA ZELMER Date of Exam: 07/11/2022 Medical Rec #:  [id number]     Height:       66.0 in Accession #:    [id number]    Weight:       172.8 lb Date of Birth:  1950-10-28     BSA:          1.880 m Patient Age:    83 years      BP:           107/59 mmHg Patient Gender: F             HR:           66 bpm. Exam Location:  [facility] Procedure: 2D Echo, Cardiac Doppler and Color Doppler Indications:     Dyspnea R06.00  History:         Patient has prior history of Echocardiogram examinations, most                  recent 11/02/2021. CHF, COPD, Arrythmias:Atrial Fibrillation;                  Risk Factors:Hypertension, Dyslipidemia and Diabetes. Morbid                  Obesity.  Sonographer:     Alvino Chapel RCS Referring Phys:  [id number] [place] Diagnosing Phys: [facility]  Sonographer Comments: Image acquisition challenging due to patient body habitus. IMPRESSIONS  1. Cannot fully assess wall motion; windows are  challenging.  Left ventricular ejection fraction, by estimation, is 40 to 45%. The left ventricle has mildly decreased function. The left ventricle demonstrates global hypokinesis. The left ventricular internal cavity size was mildly dilated. There is mild concentric left ventricular hypertrophy. Left ventricular diastolic parameters are indeterminate.  2. Right ventricular systolic function is mildly reduced. The right ventricular size is not well visualized. Tricuspid regurgitation signal is inadequate for assessing PA pressure.  3. Left atrial size was moderately dilated.  4. A small pericardial effusion is present.  5. The mitral valve is degenerative. No evidence of mitral valve regurgitation.  6. Aortic valve regurgitation is not visualized.  7. The inferior vena cava is dilated in size with >50% respiratory variability, suggesting right atrial pressure of 8 mmHg. Conclusion(s)/Recommendation(s): EF appears to be mildly decreased from prior study; windows are challenging. FINDINGS  Left Ventricle: Cannot fully assess wall motion; windows are challenging. Left ventricular ejection fraction, by estimation, is 40 to 45%. The left ventricle has mildly decreased function. The left ventricle demonstrates global hypokinesis. Definity contrast agent was given IV to delineate the left ventricular endocardial borders. The left ventricular internal cavity size was mildly dilated. There is mild concentric left ventricular hypertrophy. Left ventricular diastolic parameters are indeterminate. Right Ventricle: The right ventricular size is not well visualized. Right ventricular systolic function is mildly reduced. Tricuspid regurgitation signal is inadequate for assessing PA pressure. Left Atrium: Left atrial size was moderately dilated. Right Atrium: Right atrial size was not well visualized. Pericardium: A small pericardial effusion is present. Mitral Valve: The mitral valve is degenerative in appearance. No evidence of  mitral valve regurgitation. Tricuspid Valve: Tricuspid valve regurgitation is not demonstrated. Aortic Valve: Aortic valve regurgitation is not visualized. Pulmonic Valve: Pulmonic valve regurgitation is not visualized. Aorta: The aortic root and ascending aorta are structurally normal, with no evidence of dilitation. Venous: The inferior vena cava is dilated in size with greater than 50% respiratory variability, suggesting right atrial pressure of 8 mmHg. IAS/Shunts: The interatrial septum was not well visualized.  LEFT VENTRICLE PLAX 2D LVIDd:         5.90 cm LVIDs:         4.20 cm LV PW:         1.10 cm LV IVS:        1.00 cm LVOT diam:     1.70 cm LV SV:         26 LV SV Index:   14 LVOT Area:     2.27 cm  RIGHT VENTRICLE TAPSE (M-mode): 1.2 cm LEFT ATRIUM             Index        RIGHT ATRIUM           Index LA diam:        4.10 cm 2.18 cm/m   RA Area:     22.10 cm LA Vol (A2C):   71.1 ml 37.83 ml/m  RA Volume:   69.50 ml  36.98 ml/m LA Vol (A4C):   80.5 ml 42.83 ml/m LA Biplane Vol: 80.3 ml 42.72 ml/m  AORTIC VALVE LVOT Vmax:   64.65 cm/s LVOT Vmean:  48.450 cm/s LVOT VTI:    0.116 m  AORTA Ao Root diam: 2.90 cm MITRAL VALVE MV Area (PHT): 3.96 cm     SHUNTS MV Decel Time: 192 msec     Systemic VTI:  0.12 m MV E velocity: 118.50 cm/s  Systemic Diam: 1.70 cm Phineas Inches Electronically signed by Stanton Kidney  Branch Signature Date/Time: 07/11/2022/2:22:30 PM    Final (Updated)    DG Chest Port 1 View  Result Date: 07/10/2022 CLINICAL DATA:  Shortness of breath, chest pain EXAM: PORTABLE CHEST 1 VIEW COMPARISON:  Chest radiograph dated 06/02/2022. FINDINGS: The heart is enlarged. Vascular calcifications are seen in the aortic arch. There is mild left basilar atelectasis. The right lung is clear. There is no pleural effusion or pneumothorax. Degenerative changes are seen in the spine. IMPRESSION: 1. Mild left basilar atelectasis. 2. Cardiomegaly. Electronically Signed   By: Zerita Boers M.D.   On: 07/10/2022  17:17     Assessment and Plan:   Afib with RVR-recent diagnosis and seen in Afib clinic 05/2022 and was in NSR, history of chronic sinus bradycardia, currently in Afib rate controlled. She missed 2 days of eliquis PTA. Currently on diltiazem 60 mg q8, metoprolol 50 mg bid, eliquis 5 mg bid. With LVD and history of bradycardia would stop diltiazem and see how she does on higher dose metoprolol. Will need outpatient zio to make sure she doesn't get too slow. Has f/u with Dr. Harl Bowie 08/12/22 9:20 am  Acute on chronic diastolic CHF now with EF [id number] on echo(50-55% echo 10/2021) but difficult study due to body habitus. Has diuresed 1.5 L on IV lasix 40 mg bid, spiro 12.5 mg daily, will need to repeat limited echo to re-evaluated LVEF. Would transition to po lasix 40 mg once daily.(Was on 20 mg prn PTA)  HTN-BP running low  COPD  HLD-on lipitor  Weight loss, N/V-was 237 lbs and now down to 172 lbs(228 on admission) . Need w/u with PCP.   Risk Assessment/Risk Scores:        New York Heart Association (NYHA) Functional Class NYHA Class IV  CHA2DS2-VASc Score = 4   This indicates a 4.8% annual risk of stroke. The patient's score is based upon: CHF History: 1 HTN History: 1 Diabetes History: 0 Stroke History: 0 Vascular Disease History: 0 Age Score: 1 Gender Score: 1         For questions or updates, please contact [place] Please consult [email or web address] for contact info under    Signed, Ermalinda Barrios, PA-C  07/13/2022 8:33 AM

## 2022-07-14 DIAGNOSIS — I5043 Acute on chronic combined systolic (congestive) and diastolic (congestive) heart failure: Secondary | ICD-10-CM | POA: Diagnosis not present

## 2022-07-14 DIAGNOSIS — N1831 Chronic kidney disease, stage 3a: Secondary | ICD-10-CM | POA: Diagnosis not present

## 2022-07-14 DIAGNOSIS — I480 Paroxysmal atrial fibrillation: Secondary | ICD-10-CM | POA: Diagnosis not present

## 2022-07-14 DIAGNOSIS — I4819 Other persistent atrial fibrillation: Secondary | ICD-10-CM | POA: Diagnosis not present

## 2022-07-14 DIAGNOSIS — I5033 Acute on chronic diastolic (congestive) heart failure: Secondary | ICD-10-CM | POA: Diagnosis not present

## 2022-07-14 MED ORDER — SENNOSIDES-DOCUSATE SODIUM 8.6-50 MG PO TABS
2.0000 | ORAL_TABLET | Freq: Every day | ORAL | Status: DC
  Administered 2022-07-14 – 2022-07-18 (×4): 2 via ORAL
  Filled 2022-07-14 (×5): qty 2

## 2022-07-14 MED ORDER — AMIODARONE HCL 200 MG PO TABS: 200.0000 mg | ORAL_TABLET | Freq: Every day | ORAL | Status: DC

## 2022-07-14 MED ORDER — AMIODARONE HCL 200 MG PO TABS
200.0000 mg | ORAL_TABLET | Freq: Two times a day (BID) | ORAL | Status: DC
  Administered 2022-07-14 – 2022-07-19 (×11): 200 mg via ORAL
  Filled 2022-07-14 (×11): qty 1

## 2022-07-14 MED ORDER — POLYETHYLENE GLYCOL 3350 17 G PO PACK
17.0000 g | PACK | Freq: Every day | ORAL | Status: DC
  Administered 2022-07-14 – 2022-07-16 (×3): 17 g via ORAL
  Filled 2022-07-14 (×6): qty 1

## 2022-07-14 MED ORDER — METOPROLOL SUCCINATE ER 50 MG PO TB24
100.0000 mg | ORAL_TABLET | Freq: Every day | ORAL | Status: DC
  Administered 2022-07-15: 100 mg via ORAL
  Filled 2022-07-14: qty 2

## 2022-07-14 MED ORDER — FUROSEMIDE 40 MG PO TABS
40.0000 mg | ORAL_TABLET | Freq: Every day | ORAL | Status: DC
  Administered 2022-07-14 – 2022-07-19 (×6): 40 mg via ORAL
  Filled 2022-07-14 (×6): qty 1

## 2022-07-14 NOTE — Progress Notes (Signed)
   07/14/22 1758  ReDS Vest / Clip  Station Marker B  Ruler Value 47  ReDS Value Range  (Low quality)  ReDS Actual Value 0 (low quality)

## 2022-07-14 NOTE — TOC Initial Note (Signed)
Transition of Care [facility]) - Initial/Assessment Note    Patient Details  Name: Allison Rollins MRN: [id number] Date of Birth: [birth year]  Transition of Care [facility]) CM/SW Contact:    Salome Arnt, [place] Phone Number: 07/14/2022, 2:52 PM  Clinical Narrative:  Pt admitted due to atrial fibrillation. Pt reports she lives alone and has been struggling managing at home. She has fallen multiple times. Pt ambulates with a walker at baseline. PT evaluated pt and recommend SNF. Discussed with pt and pt's daughter-in-law April at pt request. Pt would like [facility]. Reviewed Medicare.gov ratings. Will initiate bed search and start authorization after SNF selected.                  Expected Discharge Plan: [place] Barriers to Discharge: Continued Medical Work up   Patient Goals and CMS Choice Patient states their goals for this hospitalization and ongoing recovery are:: SNF   Choice offered to / list presented to : Patient [place] ownership interest in [facility].provided to:: Patient    Expected Discharge Plan and Services In-house Referral: Clinical Social Work   Post Acute Care Choice: [place] Living arrangements for the past 2 months: [place]                                      Prior Living Arrangements/Services Living arrangements for the past 2 months: Single Family Home Lives with:: Self Patient language and need for interpreter reviewed:: Yes Do you feel safe going back to the place where you live?: Yes          Current home services: DME (cane, walker) Criminal Activity/Legal Involvement Pertinent to Current Situation/Hospitalization: No - Comment as needed  Activities of Daily Living      Permission Sought/Granted                  Emotional Assessment     Affect (typically observed): Appropriate Orientation: : Oriented to Self, Oriented to Place, Oriented to  Time, Oriented  to Situation Alcohol / Substance Use: Not Applicable Psych Involvement: No (comment)  Admission diagnosis:  Atrial fibrillation ([place]) [I48.91] Atrial fibrillation with rapid ventricular response ([place]) [I48.91] Acute on chronic congestive heart failure, unspecified heart failure type [facility]) [I50.9] Patient Active Problem List   Diagnosis Date Noted   Atrial fibrillation ([place]) 07/10/2022   Morbid obesity ([place]) 02/24/2022   Gastroesophageal reflux disease with esophagitis 02/24/2022   Dyslipidemia 09/05/2021   Parkinson's disease 09/05/2021   Chronic obstructive pulmonary disease ([place]) 09/05/2021   Edema 01/21/2021   Stage 3a chronic kidney disease ([place]) 06/17/2020   Osteopenia 01/22/2020   Neuropathy 10/21/2019   Achalasia of esophagus    Restless leg 11/24/2018   Erosive gastritis    Seasonal allergic rhinitis due to pollen 07/20/2018   Recurrent depression ([place]) 12/26/2015   Generalized anxiety disorder 03/12/2015   Prediabetes 03/12/2015   Back pain 03/12/2015   Stress incontinence 01/06/2015   Acute on chronic diastolic CHF (congestive heart failure) ([place]) 01/03/2015   Obesity (BMI 30-39.9) 07/01/2011   Essential hypertension 07/01/2011   Mixed hyperlipidemia 07/01/2011   GERD (gastroesophageal reflux disease) 09/08/2010   PCP:  Gwenlyn Perking, FNP Pharmacy:   [facility] [address] [place] - [address] [place] [address] Phone: [phone] Fax: [phone]  [facility], [place] -  [place] [address] Phone: [phone] Fax: [phone]  [facility] [place] [place] [address] Phone: [phone] Fax: [phone]     Social Determinants of Health (SDOH) Social History: [place]: No Food Insecurity (04/16/2022)  Recent Concern: [place] Present (02/24/2022)  Housing: Low Risk  (04/16/2022)   Transportation Needs: No Transportation Needs (04/16/2022)  Utilities: Not At Risk (04/16/2022)  Recent Concern: Utilities - At Risk (02/24/2022)  Alcohol Screen: Low Risk  (04/16/2022)  Depression (PHQ2-9): Low Risk  (04/16/2022)  Recent Concern: Depression (PHQ2-9) - High Risk (02/24/2022)  Financial Resource Strain: Medium Risk (04/16/2022)  Physical Activity: Insufficiently Active (04/16/2022)  Social Connections: Moderately Integrated (04/16/2022)  Recent Concern: Social Connections - Socially Isolated (03/24/2022)  Stress: No Stress Concern Present (04/16/2022)  Tobacco Use: Medium Risk (07/10/2022)   SDOH Interventions:     Readmission Risk Interventions     No data to display

## 2022-07-14 NOTE — Progress Notes (Addendum)
Patient's B/P 97/75, hr 75, I held her blood pressure medications this am,Dr Courage notified. Orders received and given. Patient out of bed to chair this morning. No c/o pain or discomfort noted. Call bell in reach,family at bedside.Plan of care on going.

## 2022-07-14 NOTE — Progress Notes (Addendum)
Rounding Note    Patient Name: Allison Rollins Date of Encounter: 07/14/2022  [facility] Cardiologist: Carlyle Dolly, MD   Subjective   Patient remains in rate controlled Afib. Bps low and AM meds are held. She denies chest pain or SOB.   Inpatient Medications    Scheduled Meds:  apixaban  5 mg Oral BID   atorvastatin  40 mg Oral Daily   Chlorhexidine Gluconate Cloth  6 each Topical Daily   furosemide  40 mg Intravenous Q12H   gabapentin  600 mg Oral TID   metoprolol tartrate  50 mg Oral BID   pantoprazole  40 mg Oral BID   pramipexole  0.5 mg Oral QHS   sertraline  200 mg Oral Daily   spironolactone  12.5 mg Oral Daily   Continuous Infusions:  PRN Meds: acetaminophen **OR** acetaminophen, alum & mag hydroxide-simeth, nitroGLYCERIN, ondansetron **OR** ondansetron (ZOFRAN) IV   Vital Signs    Vitals:   07/13/22 2032 07/13/22 2036 07/14/22 0508 07/14/22 0836  BP: 95/65 95/65 (!) 120/102 97/75  Pulse: 89 89 97 62  Resp:  18 16 18   Temp:  98.5 F (36.9 C) (!) 97.5 F (36.4 C) 97.6 F (36.4 C)  TempSrc:  Oral Oral Oral  SpO2:  93% 96% 96%  Weight:      Height:        Intake/Output Summary (Last 24 hours) at 07/14/2022 0839 Last data filed at 07/14/2022 [id number] Gross per 24 hour  Intake 1200 ml  Output 300 ml  Net 900 ml      07/12/2022    1:36 PM 07/11/2022    4:26 AM 07/10/2022    7:47 PM  Last 3 Weights  Weight (lbs) 213 lb 6.5 oz 172 lb 13.5 oz 175 lb 11.3 oz  Weight (kg) 96.8 kg 78.4 kg 79.7 kg      Telemetry    Afib Hr 90-110, PVCs - Personally Reviewed  ECG    No new - Personally Reviewed  Physical Exam   GEN: No acute distress.   Neck: No JVD Cardiac: Irreg Irreg, no murmurs, rubs, or gallops.  Respiratory: Clear to auscultation bilaterally. GI: Soft, nontender, non-distended  MS: No edema; No deformity. Neuro:  Nonfocal  Psych: Normal affect   Labs    High Sensitivity Troponin:   Recent Labs  Lab 07/10/22 1550   TROPONINIHS 11     Chemistry Recent Labs  Lab 07/10/22 1550 07/11/22 0355 07/12/22 0339 07/13/22 0458  NA 140 141 140 139  K 4.9 3.6 4.0 3.5  CL 104 103 100 95*  CO2 27 28 31  33*  GLUCOSE 117* 115* 110* 123*  BUN 16 14 17 18   CREATININE 1.00 0.94 1.15* 1.07*  CALCIUM 8.8* 8.7* 8.7* 8.8*  MG  --  1.9  --   --   PROT 6.4*  --   --   --   ALBUMIN 3.6  --   --   --   AST 23  --   --   --   ALT 28  --   --   --   ALKPHOS 74  --   --   --   BILITOT 1.0  --   --   --   GFRNONAA 60* >60 51* 55*  ANIONGAP 9 10 9 11     Lipids No results for input(s): "CHOL", "TRIG", "HDL", "LABVLDL", "LDLCALC", "CHOLHDL" in the last 168 hours.  Hematology Recent Labs  Lab 07/10/22 1550  WBC 6.6  RBC 4.76  HGB 14.4  HCT 45.6  MCV 95.8  MCH 30.3  MCHC 31.6  RDW 13.6  PLT 171   Thyroid No results for input(s): "TSH", "FREET4" in the last 168 hours.  BNP Recent Labs  Lab 07/10/22 1550  BNP 767.0*    DDimer No results for input(s): "DDIMER" in the last 168 hours.   Radiology    No results found.  Cardiac Studies   Echo 07/11/22 1. Cannot fully assess wall motion; windows are challenging. Left  ventricular ejection fraction, by estimation, is 40 to 45%. The left  ventricle has mildly decreased function. The left ventricle demonstrates  global hypokinesis. The left ventricular  internal cavity size was mildly dilated. There is mild concentric left  ventricular hypertrophy. Left ventricular diastolic parameters are  indeterminate.   2. Right ventricular systolic function is mildly reduced. The right  ventricular size is not well visualized. Tricuspid regurgitation signal is  inadequate for assessing PA pressure.   3. Left atrial size was moderately dilated.   4. A small pericardial effusion is present.   5. The mitral valve is degenerative. No evidence of mitral valve  regurgitation.   6. Aortic valve regurgitation is not visualized.   7. The inferior vena cava is dilated in  size with >50% respiratory  variability, suggesting right atrial pressure of 8 mmHg.   Conclusion(s)/Recommendation(s): EF appears to be mildly decreased from  prior study; windows are challenging.   Echo 10/2021 1. Left ventricular ejection fraction, by estimation, is 50 to 55%. The  left ventricle has low normal function. The left ventricle has no regional  wall motion abnormalities. The left ventricular internal cavity size was  mildly dilated. Left ventricular  diastolic parameters are indeterminate. Elevated left atrial pressure.   2. Right ventricular systolic function is normal. The right ventricular  size is normal. Tricuspid regurgitation signal is inadequate for assessing  PA pressure.   3. Left atrial size was mildly dilated.   4. The mitral valve is abnormal. Mild to moderate mitral valve  regurgitation. No evidence of mitral stenosis.   5. The aortic valve has an indeterminant number of cusps. Aortic valve  regurgitation is not visualized. No aortic stenosis is present.   6. Aortic dilatation noted. There is mild dilatation of the ascending  aorta, measuring 36 mm.   7. The inferior vena cava is normal in size with greater than 50%  respiratory variability, suggesting right atrial pressure of 3 mmHg.   Comparison(s): Echocardiogram done 12/20/13 showed an EF of 50-55%.   Patient Profile     72 y.o. female with a hx of HTN, chronic diastolic CHF, chronic bradycardia,  recent diagnosis Afib, COPD who is being seen 07/13/2022 for the evaluation of Afib with RVR and CHF   Assessment & Plan    Afib RVR - recent diagnosis of Afib. Seen in clininc 05/2022 and was in NSR - h/o bradycardia in SR - she missed 2 days of Eliquis 5mg  BID - metoprolol 50mg  BID for rate control - patient remains in relatively rate controlled Afib - discussed DCCV vs medication management. Patient is not wanting to undergo DCCV, can consider AA such as amiodarone.   Acute on chronic diastolic CHF -  LVEF [id number] on echo - echo 10/2021 showed LVEF 50-55% - IV lasix 40mg  BID - UOPs not complete - kidney function stable - spiro 12.5mg  daily>will hold with soft pressures - appears relatively euvolemic on exam, can possibly switch to  oral lasix - GDMT limited by soft pressures, can re-check echo in 2-3 months once patient is in NSR  HTN - Bps soft  HLD - continue Lipitor  For questions or updates, please contact [place] Please consult [email or web address] for contact info under        Signed, Cadence Ninfa Meeker, PA-C  07/14/2022, 8:39 AM     Personally seen and examined. Agree with APP above with the following comments:  Briefly 72 yo F with a history of PAF, COPD, and HFmrEF.  Complicated by relative hypotension and weakness.  Patient notes that her breathing is no longer the issues, but she can't walk without her legs giving out.  No palpitations.  No CP.  No syncope. DIL notes hx of frequent yeast infections when discussing SGLT2i therapy. They have deferred DCCV at this time.  JVD to clavicle at 90 degrees Elderly female with depressed affect R arm wounds well dressed Decreased breath sounds. I/O and weights are not accurate  Labs notable for index BNP 700s.  LVEF is clearly reduced.   Tele: AF, rate controlled with occasional PVCs  Would recommend - transition to metoprolol succinate 100 mg - Unable to tolerate significant GDMT (ARNI, ARB, MRA held) - holding SGTL2i start due to yeast infections - continue DOAC - transition to lasix 40 mg PO daily - continue home statin - patient deferred DCCV; will start amiodarone and at outpatietn f/u can discussed DCCV again  Rudean Haskell, MD [facility]  [address] [place], [place] [address] [phone]  9:39 AM

## 2022-07-14 NOTE — Progress Notes (Signed)
Progress Note   Patient: Allison Rollins [id number] DOB: 12-Apr-1951 DOA: 07/10/2022     4 DOS: the patient was seen and examined on 07/14/2022   Brief hospital course: As per H&P written by Dr. Cathlean Sauer on 07/10/2022 Allison Rollins is a 72 y.o. female with medical history significant of heart failure, atrial fibrillation, COPD, hypertension, and depression who presented with dyspnea and lower extremity edema. Reports 2 days of worsening dyspnea on exertion, to the point where she is having symptoms with minimal efforts. Her dyspnea has been associated with lower extremity edema, orthopnea and lower extremity edema. Positive cough and wheezing.   She had a recent ED visit on 02/07 for nausea and diarrhea. She was treated with IV fluids, antiemetics and was discharged home.  At home she has been taking diuretic therapy every other day as instructed.  She ambulated with a cane.  Because persistent symptoms she came to the ED for further evaluation.    Of note she had a ED visit on 02/02 and was diagnosed with new onset atrial fibrillation, she had RVR, but converted to sinus rhythm in the ED. She was placed on metoprolol, diltiazem and discharged home with instructions to follow up as outpatient with cardiology.  02/22 follow up with Cardiology patient remained in sinus rhythm and no medications changes were made.    In the ED she was found to have atrial fibrillation with RVR and was placed on diltiazem infusion.   Assessment and Plan: 1)Paroxysmal Atrial fibrillation [facility]) --Cardiology consult appreciated --Weaned off IV Cardizem drip -Avoid oral Cardizem due to low EF -Currently on amiodarone 200 mg twice daily for 20 doses and then 200 mg daily after that -Patient was on metoprolol 50 g twice daily as BP tolerates---transitioning to Toprol-XL 100 mg daily , may decrease dosage if BP remains soft -2D echo demonstrating global hypokinesis and ejection fraction 40-45%. -Continue treatment for  CHF exacerbation and follow cardiology recommendations.   -Continue Eliquis for stroke prevention   2)Acute on chronic diastolic CHF (congestive heart failure) ([place]) -Last echocardiogram from 10/2021 with preserved LV systolic function with EF 50 to 55%, mild dilatated LV cavity. RV systolic function preserved. Mild dilatation of LA, no significant valvular disease.  -Update 2D echo demonstrating ejection fraction 40 to 45% with global hypokinesis (down from 50 to 55% back in July 2023) --CHF exacerbation most likely arrhythmia mediated exacerbation -Lasix reduced to 40 mg p.o. daily -Aldactone discontinued due to soft BP - Unable to tolerate significant GDMT (ARNI, ARB, MRA held) - holding SGTL2i start due to yeast infections  3)Stage 3a chronic kidney disease (HCC) -Currently stable and at baseline - renally adjust medications, avoid nephrotoxic agents / dehydration  / hypotension  4)Dyslipidemia -Continue  Lipitor  5)GERD (gastroesophageal reflux disease) -Continue PPI. -also PRN antiemetics and maalox ordered.   6)Overweight/Class 1 obesity -Body mass index is 33.8 kg/m. -low calorie diet and portion control discussed with patient.  7) generalized weakness/deconditioning--- PT eval appreciated recommends SNF rehab   Subjective:  -Daughter-in-law at bedside -Dyspnea on exertion persist -Generalized weakness persist -Voiding okay  Physical Exam: Vitals:   07/13/22 2036 07/14/22 0508 07/14/22 0836 07/14/22 0916  BP: 95/65 (!) 120/102 97/75   Pulse: 89 97 62   Resp: 18 16 18    Temp: 98.5 F (36.9 C) (!) 97.5 F (36.4 C) 97.6 F (36.4 C)   TempSrc: Oral Oral Oral   SpO2: 93% 96% 96%   Weight:    95 kg  Height:        Physical Exam  Gen:- Awake Alert, in no acute distress  HEENT:- [place].AT, No sclera icterus Neck-Supple Neck, +ve JVD,.  Lungs-no wheezing or rales, fair air movement bilaterally  CV- S1, S2 normal, RRR Abd-  +ve B.Sounds, Abd Soft, No tenderness,     Extremity/Skin:-Mostly resolved edema,   good pedal pulses  Psych-affect is flat, oriented x3 Neuro-generalized weakness, no new focal deficits, no tremors  Family Communication: Updated DIL at bedside.  Disposition: SNF Rehab Status is: Inpatient   Planned Discharge Destination: Skilled nursing facility  Author: Roxan Hockey, MD 07/14/2022 1:39 PM  For on call review [email or web address].

## 2022-07-14 NOTE — Plan of Care (Signed)
  Problem: Acute Rehab PT Goals(only PT should resolve) Goal: Pt Will Go Supine/Side To Sit Outcome: Progressing Flowsheets (Taken 07/14/2022 1344) Pt will go Supine/Side to Sit: with supervision Goal: Patient Will Transfer Sit To/From Stand Outcome: Progressing Flowsheets (Taken 07/14/2022 1344) Patient will transfer sit to/from stand:  with modified independence  with supervision Goal: Pt Will Transfer Bed To Chair/Chair To Bed Outcome: Progressing Flowsheets (Taken 07/14/2022 1344) Pt will Transfer Bed to Chair/Chair to Bed:  with supervision  with modified independence Goal: Pt Will Ambulate Outcome: Progressing Flowsheets (Taken 07/14/2022 1344) Pt will Ambulate:  75 feet  with supervision  with min guard assist  with rolling walker   1:45 PM, 07/14/22 Lonell Grandchild, MPT Physical Therapist with [facility] [phone] office [phone] mobile phone

## 2022-07-14 NOTE — NC FL2 (Signed)
[place] LEVEL OF CARE FORM     IDENTIFICATION  Patient Name: Allison Rollins Birthdate: [birth year] Sex: female Admission Date (Current Location): 07/10/2022  [facility] and [place] Number:  [employer] and Address:  [place] [address] [place]      Provider Number: [id number]  Attending Physician Name and Address:  Roxan Hockey, MD  Relative Name and Phone Number:       Current Level of Care: Hospital Recommended Level of Care: [place] Prior Approval Number:    Date Approved/Denied:   PASRR Number: [id number]  Discharge Plan: SNF    Current Diagnoses: Patient Active Problem List   Diagnosis Date Noted   Atrial fibrillation ([place]) 07/10/2022   Morbid obesity ([place]) 02/24/2022   Gastroesophageal reflux disease with esophagitis 02/24/2022   Dyslipidemia 09/05/2021   Parkinson's disease 09/05/2021   Chronic obstructive pulmonary disease ([place]) 09/05/2021   Edema 01/21/2021   Stage 3a chronic kidney disease ([place]) 06/17/2020   Osteopenia 01/22/2020   Neuropathy 10/21/2019   Achalasia of esophagus    Restless leg 11/24/2018   Erosive gastritis    Seasonal allergic rhinitis due to pollen 07/20/2018   Recurrent depression ([place]) 12/26/2015   Generalized anxiety disorder 03/12/2015   Prediabetes 03/12/2015   Back pain 03/12/2015   Stress incontinence 01/06/2015   Acute on chronic diastolic CHF (congestive heart failure) ([place]) 01/03/2015   Obesity (BMI 30-39.9) 07/01/2011   Essential hypertension 07/01/2011   Mixed hyperlipidemia 07/01/2011   GERD (gastroesophageal reflux disease) 09/08/2010    Orientation RESPIRATION BLADDER Height & Weight     Self, Time, Situation, Place  Normal External catheter Weight: 209 lb 7 oz (95 kg) Height:  5\' 6"  (167.6 cm)  BEHAVIORAL SYMPTOMS/MOOD NEUROLOGICAL BOWEL NUTRITION STATUS      Continent Diet (Heart healthy. See d/c summary for updates.)  AMBULATORY  STATUS COMMUNICATION OF NEEDS Skin   Extensive Assist Verbally Normal                       Personal Care Assistance Level of Assistance  Bathing, Feeding, Dressing Bathing Assistance: Maximum assistance Feeding assistance: Limited assistance Dressing Assistance: Maximum assistance     Functional Limitations Info  Sight, Hearing, Speech Sight Info: Adequate Hearing Info: Adequate Speech Info: Adequate    SPECIAL CARE FACTORS FREQUENCY  PT (By licensed PT)     PT Frequency: 5x weekly              Contractures      Additional Factors Info  Code Status, Allergies, Psychotropic Code Status Info: Full code Allergies Info: Farxiga (dapagliflozin), Penicillins, Sulfa Antibiotics Psychotropic Info: Zoloft         Current Medications (07/14/2022):  This is the current hospital active medication list Current Facility-Administered Medications  Medication Dose Route Frequency Provider Last Rate Last Admin   acetaminophen (TYLENOL) tablet 650 mg  650 mg Oral Q6H PRN Barton Dubois, MD   650 mg at 07/10/22 2314   Or   acetaminophen (TYLENOL) suppository 650 mg  650 mg Rectal Q6H PRN Barton Dubois, MD       alum & mag hydroxide-simeth (MAALOX/MYLANTA) 200-200-20 MG/5ML suspension 30 mL  30 mL Oral Q6H PRN Barton Dubois, MD   30 mL at 07/13/22 1113   amiodarone (PACERONE) tablet 200 mg  200 mg Oral BID Chandrasekhar, Mahesh A, MD   200 mg at 07/14/22 1217   [START ON 07/25/2022] amiodarone (PACERONE)  tablet 200 mg  200 mg Oral Daily Chandrasekhar, Mahesh A, MD       apixaban (ELIQUIS) tablet 5 mg  5 mg Oral BID Barton Dubois, MD   5 mg at 07/14/22 [id number]   atorvastatin (LIPITOR) tablet 40 mg  40 mg Oral Daily Barton Dubois, MD   40 mg at 07/14/22 [id number]   Chlorhexidine Gluconate Cloth 2 % PADS 6 each  6 each Topical Daily Barton Dubois, MD   6 each at 07/13/22 [id number]   furosemide (LASIX) tablet 40 mg  40 mg Oral Daily Chandrasekhar, Mahesh A, MD   40 mg at 07/14/22 1217    gabapentin (NEURONTIN) capsule 600 mg  600 mg Oral TID Barton Dubois, MD   600 mg at 07/14/22 0840   [START ON 07/15/2022] metoprolol succinate (TOPROL-XL) 24 hr tablet 100 mg  100 mg Oral Daily Chandrasekhar, Mahesh A, MD       metoprolol tartrate (LOPRESSOR) tablet 50 mg  50 mg Oral BID Chandrasekhar, Mahesh A, MD   50 mg at 07/13/22 [id number]   nitroGLYCERIN (NITROSTAT) SL tablet 0.4 mg  0.4 mg Sublingual Q5 min PRN Barton Dubois, MD       ondansetron [facility]) tablet 4 mg  4 mg Oral Q6H PRN Barton Dubois, MD       Or   ondansetron [facility]) injection 4 mg  4 mg Intravenous Q6H PRN Barton Dubois, MD       pantoprazole (PROTONIX) EC tablet 40 mg  40 mg Oral BID Barton Dubois, MD   40 mg at 07/14/22 [id number]   polyethylene glycol (MIRALAX / GLYCOLAX) packet 17 g  17 g Oral Daily Emokpae, Courage, MD       pramipexole (MIRAPEX) tablet 0.5 mg  0.5 mg Oral QHS Barton Dubois, MD   0.5 mg at 07/13/22 2031   senna-docusate (Senokot-S) tablet 2 tablet  2 tablet Oral QHS Emokpae, Courage, MD       sertraline (ZOLOFT) tablet 200 mg  200 mg Oral Daily Barton Dubois, MD   200 mg at 07/14/22 [id number]     Discharge Medications: Please see discharge summary for a list of discharge medications.  Relevant Imaging Results:  Relevant Lab Results:   Additional Information SSN: [id number]  Salome Arnt, LCSW

## 2022-07-14 NOTE — Progress Notes (Signed)
Mobility Specialist Progress Note:    07/14/22 1400  Mobility  Activity Ambulated with assistance in hallway  Level of Assistance Contact guard assist, steadying assist  Assistive Device Front wheel walker  Distance Ambulated (ft) 150 ft  Activity Response Tolerated well  Mobility Referral Yes  $Mobility charge 1 Mobility   Pt agreeable to mobility session. Tolerated well, asx throughout. Returned pt to chair, all needs met, call bell in reach, nurse notified.   Royetta Crochet Mobility Specialist Please contact via Solicitor or  Rehab office at [phone]

## 2022-07-14 NOTE — Evaluation (Signed)
Physical Therapy Evaluation Patient Details Name: Allison Rollins MRN: [id number] DOB: Aug 31, 1950 Today's Date: 07/14/2022  History of Present Illness  Allison Rollins is a 72 y.o. female with medical history significant of heart failure, atrial fibrillation, COPD, hypertension, and depression who presented with dyspnea and lower extremity edema. Reports 2 days of worsening dyspnea on exertion, to the point where she is having symptoms with minimal efforts. Her dyspnea has been associated with lower extremity edema, orthopnea and lower extremity edema. Positive cough and wheezing.     She had a recent ED visit on 02/07 for nausea and diarrhea. She was treated with IV fluids, antiemetics and was discharged home.   At home she has been taking diuretic therapy every other day as instructed.   She ambulated with a cane.   Because persistent symptoms she came to the ED for further evaluation.   Clinical Impression  Patient has difficulty moving BLE when getting into/out of bed, demonstrates slow labored movement during ambulation using RW requiring frequent standing rest breaks once fatigued and increased assistance to avoid loss of balance.  Patient tolerated staying up in chair after therapy.  Patient will benefit from continued skilled physical therapy in hospital and recommended venue below to increase strength, balance, endurance for safe ADLs and gait.         Recommendations for follow up therapy are one component of a multi-disciplinary discharge planning process, led by the attending physician.  Recommendations may be updated based on patient status, additional functional criteria and insurance authorization.  Follow Up Recommendations Skilled nursing-short term rehab (<3 hours/day) Can patient physically be transported by private vehicle: Yes    Assistance Recommended at Discharge Set up Supervision/Assistance  Patient can return home with the following  A little help with walking and/or  transfers;A little help with bathing/dressing/bathroom;Assistance with cooking/housework;Help with stairs or ramp for entrance    Equipment Recommendations None recommended by PT  Recommendations for Other Services       Functional Status Assessment Patient has had a recent decline in their functional status and demonstrates the ability to make significant improvements in function in a reasonable and predictable amount of time.     Precautions / Restrictions Precautions Precautions: ICD/Pacemaker Restrictions Weight Bearing Restrictions: No      Mobility  Bed Mobility Overal bed mobility: Needs Assistance Bed Mobility: Supine to Sit, Sit to Supine     Supine to sit: Min assist Sit to supine: Min assist   General bed mobility comments: has difficulty moving legs onto/off bed due to weakness    Transfers Overall transfer level: Needs assistance Equipment used: Rolling walker (2 wheels) Transfers: Sit to/from Stand, Bed to chair/wheelchair/BSC Sit to Stand: Min assist   Step pivot transfers: Min assist       General transfer comment: slow labored unsteady movement    Ambulation/Gait Ambulation/Gait assistance: Min assist Gait Distance (Feet): 35 Feet Assistive device: Rolling walker (2 wheels) Gait Pattern/deviations: Decreased step length - right, Decreased step length - left, Decreased stride length, Trunk flexed Gait velocity: decreased     General Gait Details: slow labored unsteady cadence, once fatigued, cadence slows and requires increased assistance due to fall risk  Stairs            Wheelchair Mobility    Modified Rankin (Stroke Patients Only)       Balance Overall balance assessment: Needs assistance Sitting-balance support: Feet supported, No upper extremity supported Sitting balance-Leahy Scale: Fair Sitting balance - Comments: fair/good  seated at EOB   Standing balance support: During functional activity, Bilateral upper extremity  supported Standing balance-Leahy Scale: Fair Standing balance comment: using RW                             Pertinent Vitals/Pain Pain Assessment Pain Assessment: No/denies pain    Home Living Family/patient expects to be discharged to:: Private residence Living Arrangements: Alone Available Help at Discharge: Family Type of Home: House Home Access: Ramped entrance       Home Layout: One level Home Equipment: Conservation officer, nature (2 wheels);Cane - single point      Prior Function Prior Level of Function : Independent/Modified Independent             Mobility Comments: household ambulator using RW ADLs Comments: Assisted by family, son completes grocery shopping and brings some meals to her.     Hand Dominance        Extremity/Trunk Assessment   Upper Extremity Assessment Upper Extremity Assessment: Generalized weakness    Lower Extremity Assessment Lower Extremity Assessment: Generalized weakness    Cervical / Trunk Assessment Cervical / Trunk Assessment: Kyphotic  Communication   Communication: No difficulties  Cognition Arousal/Alertness: Awake/alert Behavior During Therapy: WFL for tasks assessed/performed Overall Cognitive Status: Within Functional Limits for tasks assessed                                          General Comments      Exercises     Assessment/Plan    PT Assessment Patient needs continued PT services  PT Problem List Decreased strength;Decreased activity tolerance;Decreased balance;Decreased mobility       PT Treatment Interventions DME instruction;Gait training;Stair training;Functional mobility training;Therapeutic activities;Patient/family education;Balance training    PT Goals (Current goals can be found in the Care Plan section)  Acute Rehab PT Goals Patient Stated Goal: return home with family to assist PT Goal Formulation: With patient Time For Goal Achievement: 07/28/22 Potential to  Achieve Goals: Good    Frequency Min 3X/week     Co-evaluation               AM-PAC PT "6 Clicks" Mobility  Outcome Measure Help needed turning from your back to your side while in a flat bed without using bedrails?: A Little Help needed moving from lying on your back to sitting on the side of a flat bed without using bedrails?: A Little Help needed moving to and from a bed to a chair (including a wheelchair)?: A Little Help needed standing up from a chair using your arms (e.g., wheelchair or bedside chair)?: A Little Help needed to walk in hospital room?: A Lot Help needed climbing 3-5 steps with a railing? : A Lot 6 Click Score: 16    End of Session   Activity Tolerance: Patient tolerated treatment well;Patient limited by fatigue Patient left: in chair;with call bell/phone within reach Nurse Communication: Mobility status PT Visit Diagnosis: Unsteadiness on feet (R26.81);Other abnormalities of gait and mobility (R26.89);Muscle weakness (generalized) (M62.81)    Time: [id number] PT Time Calculation (min) (ACUTE ONLY): 33 min   Charges:   PT Evaluation $PT Eval Moderate Complexity: 1 Mod PT Treatments $Therapeutic Activity: 23-37 mins        1:43 PM, 07/14/22 Lonell Grandchild, MPT Physical Therapist with [facility] [phone] office [phone]  mobile phone

## 2022-07-15 ENCOUNTER — Ambulatory Visit: Payer: Self-pay | Admitting: *Deleted

## 2022-07-15 ENCOUNTER — Encounter: Payer: Self-pay | Admitting: *Deleted

## 2022-07-15 DIAGNOSIS — I5033 Acute on chronic diastolic (congestive) heart failure: Secondary | ICD-10-CM | POA: Diagnosis not present

## 2022-07-15 DIAGNOSIS — I480 Paroxysmal atrial fibrillation: Secondary | ICD-10-CM | POA: Diagnosis not present

## 2022-07-15 DIAGNOSIS — I10 Essential (primary) hypertension: Secondary | ICD-10-CM | POA: Diagnosis not present

## 2022-07-15 LAB — BASIC METABOLIC PANEL
Anion gap: 11 (ref 5–15)
BUN: 23 mg/dL (ref 8–23)
CO2: 33 mmol/L — ABNORMAL HIGH (ref 22–32)
Calcium: 8.8 mg/dL — ABNORMAL LOW (ref 8.9–10.3)
Chloride: 98 mmol/L (ref 98–111)
Creatinine, Ser: 1.06 mg/dL — ABNORMAL HIGH (ref 0.44–1.00)
GFR, Estimated: 56 mL/min — ABNORMAL LOW (ref >60)
Glucose, Bld: 118 mg/dL — ABNORMAL HIGH (ref 70–99)
Potassium: 3.4 mmol/L — ABNORMAL LOW (ref 3.5–5.1)
Sodium: 142 mmol/L (ref 135–145)

## 2022-07-15 MED ORDER — METOPROLOL SUCCINATE ER 50 MG PO TB24
50.0000 mg | ORAL_TABLET | Freq: Every day | ORAL | Status: DC
  Administered 2022-07-17 – 2022-07-19 (×3): 50 mg via ORAL
  Filled 2022-07-15 (×4): qty 1

## 2022-07-15 MED ORDER — POTASSIUM CHLORIDE CRYS ER 20 MEQ PO TBCR
40.0000 meq | EXTENDED_RELEASE_TABLET | Freq: Once | ORAL | Status: AC
  Administered 2022-07-15: 40 meq via ORAL
  Filled 2022-07-15: qty 2

## 2022-07-15 NOTE — Patient Outreach (Signed)
Care Coordination   Follow Up Visit Note   07/15/2022  Name: Allison Rollins MRN: [id number] DOB: Aug 13, 1950  Allison Rollins is a 72 y.o. year old female who sees Gwenlyn Perking, FNP for primary care. I spoke with daughter-in-law, Allison Rollins by phone today.  What matters to the patients health and wellness today?   Find Help in My Community.   Goals Addressed               This Visit's Progress     Find Help in My Community. (pt-stated)   On track     Care Coordination Interventions:  Interventions Today    Flowsheet Row Most Recent Value  Chronic Disease   Chronic disease during today's visit Hypertension (HTN), Chronic Obstructive Pulmonary Disease (COPD), Other, Chronic Kidney Disease/End Stage Renal Disease (ESRD)  [Parkinson's Disease, Neuropathy, Recurrent Depression & Generalized Anxiety Disorder]  General Interventions   General Interventions Discussed/Reviewed General Interventions Discussed, General Interventions Reviewed, Annual Eye Exam, Labs, Durable Medical Equipment (DME), Vaccines, Health Screening, Community Resources, Doctor Visits, Level of Care  Labs Hgb A1c annually  Vaccines COVID-19, Flu, Pneumonia, RSV, Shingles  Doctor Visits Discussed/Reviewed Doctor Visits Discussed, Doctor Visits Reviewed, Annual Wellness Visits, PCP, Specialist  Health Screening Colonoscopy, Bone Density, Mammogram  Durable Medical Equipment (DME) BP Cuff, Walker, Community education officer  PCP/Specialist Visits Compliance with follow-up visit  Level of Care Applications, Assisted Living, Personal Care Academic librarian, Personal Care Services, Other  [place] Application]  Exercise Interventions   Exercise Discussed/Reviewed Exercise Discussed, Exercise Reviewed, Physical Activity, Assistive device use and maintanence  Physical Activity Discussed/Reviewed Physical Activity Discussed, Physical Activity Reviewed, Types of exercise   Education Interventions   Education Provided Provided Engineer, site, Provided Education, Provided Web-based Education  Provided Verbal Education On Nutrition, Eye Care, Mental Health/Coping with Illness, Applications, Exercise, Medication, Personal assistant, [employer], When to see the doctor  Applications Medicaid, Personal Care Services, Other  [place] Discussed, Mental Health Reviewed, Coping Strategies, Crisis, Anxiety, Depression  Nutrition Interventions   Nutrition Discussed/Reviewed Nutrition Discussed, Nutrition Reviewed, Fluid intake, Portion sizes, Decreasing sugar intake, Decreasing salt, Decreasing fats, Increaing proteins  Pharmacy Interventions   Pharmacy Dicussed/Reviewed Pharmacy Topics Discussed, Pharmacy Topics Reviewed, Medication Adherence, Affording Medications  Safety Interventions   Safety Discussed/Reviewed Safety Discussed, Safety Reviewed, Fall Risk  Advanced Directive Interventions   Advanced Directives Discussed/Reviewed Advanced Directives Discussed, Advanced Directives Reviewed     Active Listening & Reflection Utilized.  Verbalization of Feelings Encouraged.  Emotional Support Provided. Feelings of Frustration Regarding Loss if Independence Validated. Caregiver Stress Acknowledged. Caregiver Resources Reviewed. Caregiver Support Groups Mailed. Self-Enrollment in Caregiver Support Group of Interest Emphasized. Problem Solving Interventions Enacted. Solution-Focused Strategies Employed. Task-Centered Solutions Implemented. Acceptance & Commitment Therapy Initiated. Cognitive Behavioral Therapy Engaged. Client Centered Therapy Performed. CSW Continued Collaboration with Daughter-in-Law, Allison Rollins to Obtain Update on Patient's Medical Status, As Patient Has Been Hospitalized Since 07/10/2022, Due to Atrial Fibrillation. CSW  Continued Collaboration with Daughter-in-Law, Allison Rollins to [place] Placement for Patient, Upon Discharge from [facility], As Patient Would Greatly Benefit from [place]. CSW Continued Collaboration with Daughter-in-Law, Allison Rollins to Confirm Agreement for Patient to [place] [facility], As Well As Neurosurgeon of Interest (I.e. [place]). CSW Continued Collaboration with Daughter-in-Law, Allison Rollins to  Emphasize Importance of Direct Communication with Benay Pike, Transition of Care Licensed Clinical Social Worker with [place] with Daughter-in-Law, Allison Rollins to [employer] with Toni Arthurs to The Following List of [facility], In An Effort to [employer] for Patient: ~ [employer] [phone]# [phone]) ~ [employer] [phone]) ~ Lot 520-018-4251 (# [phone]) ~ [employer] [phone]# [phone]) ~ [place] [phone]) ~ [employer] [phone]# [phone]) ~ [employer] (# [phone]) ~ [place] (# [phone]) ~ [place] (# [phone]) ~ [place] [phone]) ~ Aging, [place] [facility] [place] [phone]) [place] with Daughter-in-Law, Allison Rollins to [employer] with Completion of Application for [place] to [facility] [place] [phone]), for Processing. ~ 2023 Medicaid Tips ~ Medicaid Application CSW Continued Collaboration with Daughter-in-Law, Allison Rollins to [employer] with Completion of Application for [place] to [employer] [phone]), for Processing. ~ [place] Instructions ~ [place] Application ~ [place] Provider List CSW Continued Collaboration with Daughter-in-Law, Allison Rollins to [employer] with Completion of Application for [place] to [facility] Patient Financial Services [phone]), for Processing. ~ [place] Application  CSW Continued Collaboration with Daughter-in-Law, Allison Rollins to [employer] with CSW (# [phone]), if She Has Questions, Needs Assistance with Application Completion & Submission, or If Additional Social Work Needs Are Identified Between Now & Our Next Scheduled Telephone Follow-Up [employer].      SDOH assessments and interventions completed:  Yes.  Care Coordination Interventions:  Yes, provided.   Follow up plan: Follow up call scheduled for 07/20/2022 at 10:15 am.  Encounter Outcome:  Pt. Visit Completed.   Nat Christen, BSW, MSW, LCSW  Licensed Education officer, [facility]  Mailing [place] [address] [place], [place] [address] Physical Address-[address] [place], [place] [address] Toll Free Main # [phone] Fax # [phone] Cell # [phone] [email or web address]

## 2022-07-15 NOTE — Progress Notes (Signed)
Progress Note   Patient: Allison Rollins [id number] DOB: [birth year] DOA: 07/10/2022     5 DOS: the patient was seen and examined on 07/15/2022   Brief hospital course: As per H&P written by Dr. Cathlean Sauer on 07/10/2022 [name] is a 72 y.o. female with medical history significant of heart failure, atrial fibrillation, COPD, hypertension, and depression who presented with dyspnea and lower extremity edema. Reports 2 days of worsening dyspnea on exertion, to the point where she is having symptoms with minimal efforts. Her dyspnea has been associated with lower extremity edema, orthopnea and lower extremity edema. Positive cough and wheezing.   She had a recent ED visit on 02/07 for nausea and diarrhea. She was treated with IV fluids, antiemetics and was discharged home.  At home she has been taking diuretic therapy every other day as instructed.  She ambulated with a cane.  Because persistent symptoms she came to the ED for further evaluation.    Of note she had a ED visit on 02/02 and was diagnosed with new onset atrial fibrillation, she had RVR, but converted to sinus rhythm in the ED. She was placed on metoprolol, diltiazem and discharged home with instructions to follow up as outpatient with cardiology.  02/22 follow up with Cardiology patient remained in sinus rhythm and no medications changes were made.    In the ED she was found to have atrial fibrillation with RVR and was placed on diltiazem infusion.   Assessment and Plan: 1)Paroxysmal Atrial fibrillation [facility]) --Cardiology consult appreciated --Weaned off IV Cardizem drip -Avoid oral Cardizem due to low EF -Currently on amiodarone 200 mg twice daily for 20 doses and then 200 mg daily after that -2D echo demonstrating global hypokinesis and ejection fraction 40-45%. -Continue Eliquis for stroke prevention 07/15/22 -Decrease Toprol-XL to 50 mg daily from 100 mg due to soft BP As of 07/15/22 pt is medically stable for discharge to  SNF rehab when bed is available and insurance authorization can be obtained   2)Acute on chronic diastolic CHF (congestive heart failure) ([place]) -Last echocardiogram from 10/2021 with preserved LV systolic function with EF 50 to 55%, mild dilatated LV cavity. RV systolic function preserved. Mild dilatation of LA, no significant valvular disease.  -Update 2D echo demonstrating ejection fraction 40 to 45% with global hypokinesis (down from 50 to 55% back in July 2023) --CHF exacerbation most likely arrhythmia mediated exacerbation -Lasix reduced to 40 mg p.o. daily -Aldactone discontinued due to soft BP - Unable to tolerate significant GDMT (ARNI, ARB, MRA held) - holding SGTL2i start due to yeast infections  3)Stage 3a chronic kidney disease (HCC) -Currently stable and at baseline - renally adjust medications, avoid nephrotoxic agents / dehydration  / hypotension  4)Dyslipidemia -Continue  Lipitor  5)GERD (gastroesophageal reflux disease) -Continue PPI. -also PRN antiemetics and maalox ordered.   6)Overweight/Class 1 obesity -Body mass index is 33.8 kg/m. -low calorie diet and portion control discussed with patient.  7)Generalized weakness/deconditioning--- PT eval appreciated, recommends SNF rehab  Dispo:-As of 07/15/22 pt is medically stable for discharge to SNF rehab when bed is available and insurance authorization can be obtained  Subjective:  -Complains of fatigue and weakness, no chest pains - As of 07/15/22 pt is medically stable for discharge to SNF rehab when bed is available and insurance authorization can be obtained  Physical Exam: Vitals:   07/14/22 2059 07/14/22 2103 07/15/22 0551 07/15/22 1319  BP: 110/63  112/76 91/78  Pulse: (!) 44 77 61 (!) 58  Resp: 14  16 16   Temp: 97.8 F (36.6 C)  (!) 97.4 F (36.3 C) 97.8 F (36.6 C)  TempSrc: Oral  Oral Oral  SpO2: 92%  93% 92%  Weight:      Height:        Physical Exam  Gen:- Awake Alert, in no acute  distress  HEENT:- [place].AT, No sclera icterus Neck-Supple Neck, +ve JVD,.  Lungs-no wheezing or rales, fair air movement bilaterally  CV- S1, S2 normal, RRR Abd-  +ve B.Sounds, Abd Soft, No tenderness,    Extremity/Skin:-Mostly resolved edema,   good pedal pulses  Psych-affect is flat, oriented x3 Neuro-generalized weakness, no new focal deficits, no tremors  Family Communication: No family at bedside.  Disposition: SNF Rehab Status is: Inpatient   Planned Discharge Destination: Skilled nursing facility  Author: Roxan Hockey, MD 07/15/2022 6:41 PM  For on call review [email or web address].

## 2022-07-15 NOTE — Progress Notes (Signed)
Rounding Note    Patient Name: Allison Rollins Date of Encounter: 07/15/2022  [place] Cardiologist: Carlyle Dolly, MD   Subjective   No complaints  Inpatient Medications    Scheduled Meds:  amiodarone  200 mg Oral BID   [START ON 07/25/2022] amiodarone  200 mg Oral Daily   apixaban  5 mg Oral BID   atorvastatin  40 mg Oral Daily   Chlorhexidine Gluconate Cloth  6 each Topical Daily   furosemide  40 mg Oral Daily   gabapentin  600 mg Oral TID   metoprolol succinate  100 mg Oral Daily   pantoprazole  40 mg Oral BID   polyethylene glycol  17 g Oral Daily   pramipexole  0.5 mg Oral QHS   senna-docusate  2 tablet Oral QHS   sertraline  200 mg Oral Daily   Continuous Infusions:  PRN Meds: acetaminophen **OR** acetaminophen, alum & mag hydroxide-simeth, nitroGLYCERIN, ondansetron **OR** ondansetron (ZOFRAN) IV   Vital Signs    Vitals:   07/14/22 1300 07/14/22 2059 07/14/22 2103 07/15/22 0551  BP: 102/66 110/63  112/76  Pulse: 63 (!) 44 77 61  Resp:  14  16  Temp: 98.4 F (36.9 C) 97.8 F (36.6 C)  (!) 97.4 F (36.3 C)  TempSrc: Oral Oral  Oral  SpO2: 95% 92%  93%  Weight:      Height:        Intake/Output Summary (Last 24 hours) at 07/15/2022 1029 Last data filed at 07/15/2022 0842 Gross per 24 hour  Intake 360 ml  Output --  Net 360 ml      07/14/2022    9:16 AM 07/12/2022    1:36 PM 07/11/2022    4:26 AM  Last 3 Weights  Weight (lbs) 209 lb 7 oz 213 lb 6.5 oz 172 lb 13.5 oz  Weight (kg) 95 kg 96.8 kg 78.4 kg      Telemetry    Afib low 100s - Personally Reviewed  ECG    N/a - Personally Reviewed  Physical Exam   GEN: No acute distress.   Neck: No JVD Cardiac: irreg, no murmurs, rubs, or gallops.  Respiratory: Clear to auscultation bilaterally. GI: Soft, nontender, non-distended  MS: No edema; No deformity. Neuro:  Nonfocal  Psych: Normal affect   Labs    High Sensitivity Troponin:   Recent Labs  Lab 07/10/22 1550   TROPONINIHS 11     Chemistry Recent Labs  Lab 07/10/22 1550 07/11/22 0355 07/12/22 0339 07/13/22 0458 07/15/22 0402  NA 140 141 140 139 142  K 4.9 3.6 4.0 3.5 3.4*  CL 104 103 100 95* 98  CO2 27 28 31  33* 33*  GLUCOSE 117* 115* 110* 123* 118*  BUN 16 14 17 18 23   CREATININE 1.00 0.94 1.15* 1.07* 1.06*  CALCIUM 8.8* 8.7* 8.7* 8.8* 8.8*  MG  --  1.9  --   --   --   PROT 6.4*  --   --   --   --   ALBUMIN 3.6  --   --   --   --   AST 23  --   --   --   --   ALT 28  --   --   --   --   ALKPHOS 74  --   --   --   --   BILITOT 1.0  --   --   --   --   GFRNONAA 60* >  60 51* 55* 56*  ANIONGAP 9 10 9 11 11     Lipids No results for input(s): "CHOL", "TRIG", "HDL", "LABVLDL", "LDLCALC", "CHOLHDL" in the last 168 hours.  Hematology Recent Labs  Lab 07/10/22 1550  WBC 6.6  RBC 4.76  HGB 14.4  HCT 45.6  MCV 95.8  MCH 30.3  MCHC 31.6  RDW 13.6  PLT 171   Thyroid No results for input(s): "TSH", "FREET4" in the last 168 hours.  BNP Recent Labs  Lab 07/10/22 1550  BNP 767.0*    DDimer No results for input(s): "DDIMER" in the last 168 hours.   Radiology    No results found.  Cardiac Studies     Patient Profile     72 y.o. female with a hx of HTN, chronic diastolic CHF, chronic bradycardia,  recent diagnosis Afib, COPD who is being seen 07/13/2022 for the evaluation of Afib with RVR and CHF   Assessment & Plan    1.Afib with RVR - recent diagnosis of afib - cautious rate control given prior issues with bradycardia when in SR.  - pt not in favor of DCCV - 06/2022 echo: LVEF [id number], indet diastolic fxn, mild RV dysfunction, mod LAE LAVI 42  - started on oral amio this admit 259mbg bid then 200mg  daily starting 3/31. - she is on toprol 100mg  daily - on eliquis 5mg  bid.  - readdess DCCV as outpatient if does not convert on amio  2.Acute on chronic HFrEF - 10/2021 echo: LVE 99991111, indet diastolic, normal RV - [id number] echo: LVEF [id number], indet diastolic fxn, mild RV  dysfunction, mod LAE LAVI 42  -diuresed, has been transitioned to oral lasix 40mg  daily.   - medical therapy with toprol 100. Soft bp's avoid ACE/ARB/ARNI/MRA - no SGLT2i due to yeast infection   Ok for d/c from cardiac standpoint, she has f/u with me already set for April. We will sign off inpatient care.     For questions or updates, please contact [place] Please consult [email or web address] for contact info under        Signed, Carlyle Dolly, MD  07/15/2022, 10:29 AM

## 2022-07-15 NOTE — Patient Instructions (Signed)
Visit Information  Thank you for taking time to visit with me today. Please don't hesitate to contact me if I can be of assistance to you.   Following are the goals we discussed today:   Goals Addressed               This Visit's Progress     Find Help in My Community. (pt-stated)   On track     Care Coordination Interventions:  Interventions Today    Flowsheet Row Most Recent Value  Chronic Disease   Chronic disease during today's visit Hypertension (HTN), Chronic Obstructive Pulmonary Disease (COPD), Other, Chronic Kidney Disease/End Stage Renal Disease (ESRD)  [Parkinson's Disease, Neuropathy, Recurrent Depression & Generalized Anxiety Disorder]  General Interventions   General Interventions Discussed/Reviewed General Interventions Discussed, General Interventions Reviewed, Annual Eye Exam, Labs, Durable Medical Equipment (DME), Vaccines, Health Screening, Community Resources, Doctor Visits, Level of Care  Labs Hgb A1c annually  Vaccines COVID-19, Flu, Pneumonia, RSV, Shingles  Doctor Visits Discussed/Reviewed Doctor Visits Discussed, Doctor Visits Reviewed, Annual Wellness Visits, PCP, Specialist  Health Screening Colonoscopy, Bone Density, Mammogram  Durable Medical Equipment (DME) BP Cuff, Walker, Community education officer  PCP/Specialist Visits Compliance with follow-up visit  Level of Care Applications, Assisted Living, Personal Care Academic librarian, Personal Care Services, Other  [place] Application]  Exercise Interventions   Exercise Discussed/Reviewed Exercise Discussed, Exercise Reviewed, Physical Activity, Assistive device use and maintanence  Physical Activity Discussed/Reviewed Physical Activity Discussed, Physical Activity Reviewed, Types of exercise  Education Interventions   Education Provided Provided Engineer, site, Provided Education, Provided Web-based Education  Provided Verbal Education On Nutrition, Eye  Care, Mental Health/Coping with Illness, Applications, Exercise, Medication, Personal assistant, [employer], When to see the doctor  Applications Medicaid, Personal Care Services, Other  [place] Discussed, Mental Health Reviewed, Coping Strategies, Crisis, Anxiety, Depression  Nutrition Interventions   Nutrition Discussed/Reviewed Nutrition Discussed, Nutrition Reviewed, Fluid intake, Portion sizes, Decreasing sugar intake, Decreasing salt, Decreasing fats, Increaing proteins  Pharmacy Interventions   Pharmacy Dicussed/Reviewed Pharmacy Topics Discussed, Pharmacy Topics Reviewed, Medication Adherence, Affording Medications  Safety Interventions   Safety Discussed/Reviewed Safety Discussed, Safety Reviewed, Fall Risk  Advanced Directive Interventions   Advanced Directives Discussed/Reviewed Advanced Directives Discussed, Advanced Directives Reviewed     Active Listening & Reflection Utilized.  Verbalization of Feelings Encouraged.  Emotional Support Provided. Feelings of Frustration Regarding Loss if Independence Validated. Caregiver Stress Acknowledged. Caregiver Resources Reviewed. Caregiver Support Groups Mailed. Self-Enrollment in Caregiver Support Group of Interest Emphasized. Problem Solving Interventions Enacted. Solution-Focused Strategies Employed. Task-Centered Solutions Implemented. Acceptance & Commitment Therapy Initiated. Cognitive Behavioral Therapy Engaged. Client Centered Therapy Performed. CSW Continued Collaboration with Daughter-in-Law, [name] to Obtain Update on Patient's Medical Status, As Patient Has Been Hospitalized Since 07/10/2022, Due to Atrial Fibrillation. CSW Continued Collaboration with Daughter-in-Law, [name] to [place] Placement for Patient, Upon Discharge from [facility], As  Patient Would Greatly Benefit from [place]. CSW Continued Collaboration with Daughter-in-Law, [name] to Confirm Agreement for Patient to [place] Placement, As Well As Neurosurgeon of Interest (I.e. [place]). CSW Continued Collaboration with Daughter-in-Law, [name] to [employer] Importance of Direct Communication with Benay Pike, Transition of Care Licensed Clinical Social Worker with [place] with Daughter-in-Law, [name] to [employer]  Assistance with [place] to The Following List of [facility], In An Effort to [place] for Patient: ~ [employer] [phone]# [phone]) ~ [employer] [phone]) ~ [place] 224-542-7570 [phone]) ~ [place] [phone]# [phone]) ~ [place] [phone]) ~ [facility] [phone]# [phone]) ~ [employer] (# [phone]) ~ [place] (# [phone]) ~ [place] (# [phone]) ~ [place] [phone]) ~ Aging, [place] of [place] [phone]) [place] with Daughter-in-Law, [name] to [employer] with Completion of Application for [employer] & Submission to [employer] [place] [phone]), for Processing. ~ 2023 Medicaid Tips ~ Medicaid Application CSW Continued Collaboration with Daughter-in-Law, [name] to [employer] with Completion of Application for [place] to [employer] [phone]), for Processing. ~ [place] Instructions ~ [place] Application ~ [place] Provider List CSW Continued Collaboration with Daughter-in-Law, [name] to [employer] with  Completion of Application for [place] to [facility] Patient Financial Services [phone]), for Processing. ~ [place] Application  CSW Continued Collaboration with Daughter-in-Law, [name] to [employer] with CSW (# [phone]), if She Has Questions, Needs Assistance with Application Completion & Submission, or If Additional Social Work Needs Are Identified Between Now & Our Next Scheduled Telephone Follow-Up [employer].      Our next appointment is by telephone on 07/20/2022 at 10:15 am.  Please call the care guide team at [phone] if you need to cancel or reschedule your appointment.   If you are experiencing a Mental Health or [place] or need someone to talk to, please call the Suicide and Crisis Lifeline: 988 call the [place] National Suicide Prevention Lifeline: 907-182-3395 or TTY: 709-446-0352 TTY 203-259-6227) to talk to a trained counselor call 1-800-273-TALK (toll free, 24 hour hotline) go to [facility] [address] [place] [phone]) call the [place]: [phone] call 911  Patient verbalizes understanding of instructions and care plan provided today and agrees to view in [place]. Active MyChart status and patient understanding of how to access instructions and care plan via MyChart confirmed with patient.     Telephone follow up appointment with care management team member scheduled for:  07/20/2022 at 10:15 am.  Nat Christen, BSW, MSW, [place]  Licensed Clinical Social Worker  [place]  Mailing [place]. [address], [place], [place] [address] Physical Address-[address] [place], [place] [address] Toll Free Main # [phone] Fax # [phone] Cell # [phone] [email or web address]

## 2022-07-16 DIAGNOSIS — I480 Paroxysmal atrial fibrillation: Secondary | ICD-10-CM | POA: Diagnosis not present

## 2022-07-16 DIAGNOSIS — I5033 Acute on chronic diastolic (congestive) heart failure: Secondary | ICD-10-CM | POA: Diagnosis not present

## 2022-07-16 NOTE — Progress Notes (Signed)
Physical Therapy Treatment Patient Details Name: Allison Rollins MRN: [id number] DOB: Mar 09, 1951 Today's Date: 07/16/2022   History of Present Illness Allison Rollins is a 72 y.o. female with medical history significant of heart failure, atrial fibrillation, COPD, hypertension, and depression who presented with dyspnea and lower extremity edema. Reports 2 days of worsening dyspnea on exertion, to the point where she is having symptoms with minimal efforts. Her dyspnea has been associated with lower extremity edema, orthopnea and lower extremity edema. Positive cough and wheezing.     She had a recent ED visit on 02/07 for nausea and diarrhea. She Rollins treated with IV fluids, antiemetics and Rollins discharged home.   At home she has been taking diuretic therapy every other day as instructed.   She ambulated with a cane.   Because persistent symptoms she came to the ED for further evaluation.    PT Comments    Patient demonstrates slow labored movement for sitting up at bedside requiring HOB fully raised, able to transfer to/from commode in bathroom with slow labored movement and tolerated washing hands while standing in front of since.  Patient unsteady during gait training with occasional buckling of RLE without loss of balance and limited mostly due to fatigue.  Patient tolerated sitting up in chair after therapy - nursing notified.  Patient will benefit from continued skilled physical therapy in hospital and recommended venue below to increase strength, balance, endurance for safe ADLs and gait.     Recommendations for follow up therapy are one component of a multi-disciplinary discharge planning process, led by the attending physician.  Recommendations may be updated based on patient status, additional functional criteria and insurance authorization.  Follow Up Recommendations  Skilled nursing-short term rehab (<3 hours/day) Can patient physically be transported by private vehicle: Yes   Assistance  Recommended at Discharge Set up Supervision/Assistance  Patient can return home with the following A little help with walking and/or transfers;A little help with bathing/dressing/bathroom;Assistance with cooking/housework;Help with stairs or ramp for entrance   Equipment Recommendations  None recommended by PT    Recommendations for Other Services       Precautions / Restrictions Precautions Precautions: Fall Restrictions Weight Bearing Restrictions: No     Mobility  Bed Mobility Overal bed mobility: Needs Assistance Bed Mobility: Supine to Sit     Supine to sit: Min guard, Min assist, HOB elevated     General bed mobility comments: slow labored movement requiring HOB fully raised    Transfers Overall transfer level: Needs assistance Equipment used: Rolling walker (2 wheels) Transfers: Sit to/from Stand, Bed to chair/wheelchair/BSC Sit to Stand: Min assist   Step pivot transfers: Min assist       General transfer comment: slow labored movement during transfer to/from commode in bathroom and to chair at bedside    Ambulation/Gait Ambulation/Gait assistance: Min assist Gait Distance (Feet): 35 Feet Assistive device: Rolling walker (2 wheels) Gait Pattern/deviations: Decreased step length - right, Decreased step length - left, Decreased stride length, Trunk flexed, Knees buckling Gait velocity: decreased     General Gait Details: slow labored cadence with occasionl buckling of RLE without loss of balance, occasional standing rest breaks and limited mostly due to fatigue   Stairs             Wheelchair Mobility    Modified Rankin (Stroke Patients Only)       Balance Overall balance assessment: Needs assistance Sitting-balance support: Feet supported, No upper extremity supported Sitting balance-Leahy Scale:  Fair Sitting balance - Comments: fair/good seated at EOB   Standing balance support: During functional activity, Bilateral upper extremity  supported Standing balance-Leahy Scale: Fair Standing balance comment: using RW                            Cognition Arousal/Alertness: Awake/alert Behavior During Therapy: WFL for tasks assessed/performed Overall Cognitive Status: Within Functional Limits for tasks assessed                                          Exercises      General Comments        Pertinent Vitals/Pain Pain Assessment Pain Assessment: No/denies pain    Home Living                          Prior Function            PT Goals (current goals can now be found in the care plan section) Acute Rehab PT Goals Patient Stated Goal: return home with family to assist PT Goal Formulation: With patient Time For Goal Achievement: 07/28/22 Potential to Achieve Goals: Good Progress towards PT goals: Progressing toward goals    Frequency    Min 3X/week      PT Plan Current plan remains appropriate    Co-evaluation              AM-PAC PT "6 Clicks" Mobility   Outcome Measure  Help needed turning from your back to your side while in a flat bed without using bedrails?: A Little Help needed moving from lying on your back to sitting on the side of a flat bed without using bedrails?: A Little Help needed moving to and from a bed to a chair (including a wheelchair)?: A Little Help needed standing up from a chair using your arms (e.g., wheelchair or bedside chair)?: A Little Help needed to walk in hospital room?: A Lot Help needed climbing 3-5 steps with a railing? : A Lot 6 Click Score: 16    End of Session   Activity Tolerance: Patient tolerated treatment well;Patient limited by fatigue Patient left: in chair;with call bell/phone within reach Nurse Communication: Mobility status PT Visit Diagnosis: Unsteadiness on feet (R26.81);Other abnormalities of gait and mobility (R26.89);Muscle weakness (generalized) (M62.81)     Time: [id number] PT Time Calculation  (min) (ACUTE ONLY): 31 min  Charges:  $Gait Training: 8-22 mins $Therapeutic Activity: 8-22 mins                     2:20 PM, 07/16/22 Lonell Grandchild, MPT Physical Therapist with [facility] [phone] office [phone] mobile phone

## 2022-07-16 NOTE — TOC Progression Note (Addendum)
Transition of Care [facility]) - Progression Note    Patient Details  Name: Allison Rollins MRN: [id number] Date of Birth: 1951-01-08  Transition of Care [facility]) CM/SW Contact  Shade Flood, LCSW Phone Number: 07/16/2022, 3:37 PM  Clinical Narrative:     Pt's insurance Josem Kaufmann has not yet been approved. Message left for [facility] SW. If auth received over the weekend, TOC will follow up with [facility] to see if pt can dc.  Received return call from [facility] [place], who states that if pt receives auth over the weekend, she can admit.   Expected Discharge Plan: [place] Barriers to Discharge: Continued Medical Work up  Expected Discharge Plan and Services In-house Referral: Clinical Social Work   Post Acute Care Choice: [place] Living arrangements for the past 2 months: [place] Determinants of Health (SDOH) Interventions SDOH Screenings   Food Insecurity: No Food Insecurity (04/16/2022)  Recent Concern: [place] Present (02/24/2022)  Housing: Low Risk  (04/16/2022)  Transportation Needs: No Transportation Needs (04/16/2022)  Utilities: Not At Risk (04/16/2022)  Recent Concern: Utilities - At Risk (02/24/2022)  Alcohol Screen: Low Risk  (04/16/2022)  Depression (PHQ2-9): Low Risk  (04/16/2022)  Recent Concern: Depression (PHQ2-9) - High Risk (02/24/2022)  Financial Resource Strain: Medium Risk (04/16/2022)  Physical Activity: Insufficiently Active (04/16/2022)  Social Connections: Moderately Integrated (04/16/2022)  Recent Concern: Social Connections - Socially Isolated (03/24/2022)  Stress: No Stress Concern Present (04/16/2022)  Tobacco Use: Medium Risk (07/15/2022)    Readmission Risk Interventions     No data to display

## 2022-07-16 NOTE — Progress Notes (Signed)
Progress Note   Patient: Allison Rollins [id number] DOB: 12/07/1950 DOA: 07/10/2022     6 DOS: the patient was seen and examined on 07/16/2022   Brief hospital course: As per H&P written by Dr. Cathlean Sauer on 07/10/2022 [name] is a 72 y.o. female with medical history significant of heart failure, atrial fibrillation, COPD, hypertension, and depression who presented with dyspnea and lower extremity edema. Reports 2 days of worsening dyspnea on exertion, to the point where she is having symptoms with minimal efforts. Her dyspnea has been associated with lower extremity edema, orthopnea and lower extremity edema. Positive cough and wheezing.   She had a recent ED visit on 02/07 for nausea and diarrhea. She was treated with IV fluids, antiemetics and was discharged home.  At home she has been taking diuretic therapy every other day as instructed.  She ambulated with a cane.  Because persistent symptoms she came to the ED for further evaluation.    Of note she had a ED visit on 02/02 and was diagnosed with new onset atrial fibrillation, she had RVR, but converted to sinus rhythm in the ED. She was placed on metoprolol, diltiazem and discharged home with instructions to follow up as outpatient with cardiology.  02/22 follow up with Cardiology patient remained in sinus rhythm and no medications changes were made.    In the ED she was found to have atrial fibrillation with RVR and was placed on diltiazem infusion.  As of 07/16/22 pt remains medically stable for discharge to SNF rehab when bed is available and insurance authorization can be obtained   Assessment and Plan: 1)Paroxysmal Atrial fibrillation [facility]) --Cardiology consult appreciated --Weaned off IV Cardizem drip -Avoid oral Cardizem due to low EF -Currently on amiodarone 200 mg twice daily for 20 doses and then 200 mg daily after that -2D echo demonstrating global hypokinesis and ejection fraction 40-45%. -Continue Eliquis for stroke  prevention C/n  Toprol-XL to 50 mg daily from 100 mg due to soft BP As of 07/16/22 pt remains medically stable for discharge to SNF rehab when bed is available and insurance authorization can be obtained   2)Acute on chronic diastolic CHF (congestive heart failure) ([place]) -Last echocardiogram from 10/2021 with preserved LV systolic function with EF 50 to 55%, mild dilatated LV cavity. RV systolic function preserved. Mild dilatation of LA, no significant valvular disease.  -Update 2D echo demonstrating ejection fraction 40 to 45% with global hypokinesis (down from 50 to 55% back in July 2023) --CHF exacerbation most likely arrhythmia mediated exacerbation -Lasix reduced to 40 mg p.o. daily -Aldactone discontinued due to soft BP - Unable to tolerate significant GDMT (ARNI, ARB, MRA held) - holding SGTL2i start due to yeast infections  3)Stage 3a chronic kidney disease (HCC) -Currently stable and at baseline - renally adjust medications, avoid nephrotoxic agents / dehydration  / hypotension  4)Dyslipidemia -Continue  Lipitor  5)GERD (gastroesophageal reflux disease) -Continue PPI. -also PRN antiemetics and maalox ordered.   6)Overweight/Class 1 obesity -Body mass index is 33.8 kg/m. -low calorie diet and portion control discussed with patient.  7)Generalized weakness/deconditioning--- PT eval appreciated, recommends SNF rehab  Dispo:--As of 07/16/22 pt remains medically stable for discharge to SNF rehab when bed is available and insurance authorization can be obtained  Subjective:  -Complains of fatigue and weakness, no chest pains --Voiding okay As of 07/16/22 pt remains medically stable for discharge to SNF rehab when bed is available and insurance authorization can be obtained  Physical Exam: Vitals:  07/15/22 2009 07/16/22 0509 07/16/22 0819 07/16/22 1259  BP: 106/73 116/79 101/75 106/76  Pulse: 96 (!) 43 73 72  Resp: 16 16 18 18   Temp: 98.3 F (36.8 C) (!) 97.5 F (36.4  C) 97.6 F (36.4 C) 97.8 F (36.6 C)  TempSrc: Oral Oral Oral Oral  SpO2: 97% 95% 92% 93%  Weight:      Height:        Physical Exam  Gen:- Awake Alert, in no acute distress  HEENT:- [place].AT, No sclera icterus Neck-Supple Neck, +ve JVD,.  Lungs-no wheezing or rales, fair air movement bilaterally  CV- S1, S2 normal, RRR Abd-  +ve B.Sounds, Abd Soft, No tenderness,    Extremity/Skin:-Mostly resolved edema,   good pedal pulses  Psych-affect is flat, oriented x3 Neuro-generalized weakness, no new focal deficits, no tremors  Family Communication: No family at bedside.  Disposition: SNF Rehab Status is: Inpatient   Planned Discharge Destination: Skilled nursing facility  Author: Roxan Hockey, MD 07/16/2022 8:10 PM  For on call review [email or web address].

## 2022-07-16 NOTE — Care Management Important Message (Signed)
Important Message  Patient Details Verbal IM Letter given. Name: Allison Rollins MRN: [id number] Date of Birth: 11-25-1950   Medicare Important Message Given:  Yes     Kerin Salen 07/16/2022, 2:59 PM

## 2022-07-17 DIAGNOSIS — I4891 Unspecified atrial fibrillation: Secondary | ICD-10-CM | POA: Diagnosis not present

## 2022-07-17 DIAGNOSIS — I10 Essential (primary) hypertension: Secondary | ICD-10-CM | POA: Diagnosis not present

## 2022-07-17 LAB — RENAL FUNCTION PANEL
Albumin: 3.4 g/dL — ABNORMAL LOW (ref 3.5–5.0)
Anion gap: 11 (ref 5–15)
BUN: 23 mg/dL (ref 8–23)
CO2: 27 mmol/L (ref 22–32)
Calcium: 8.8 mg/dL — ABNORMAL LOW (ref 8.9–10.3)
Chloride: 101 mmol/L (ref 98–111)
Creatinine, Ser: 1.04 mg/dL — ABNORMAL HIGH (ref 0.44–1.00)
GFR, Estimated: 57 mL/min — ABNORMAL LOW (ref >60)
Glucose, Bld: 111 mg/dL — ABNORMAL HIGH (ref 70–99)
Phosphorus: 3.5 mg/dL (ref 2.5–4.6)
Potassium: 3.8 mmol/L (ref 3.5–5.1)
Sodium: 139 mmol/L (ref 135–145)

## 2022-07-17 NOTE — Progress Notes (Signed)
Progress Note   Patient: Allison Rollins [id number] DOB: 12/31/1950 DOA: 07/10/2022     7 DOS: the patient was seen and examined on 07/17/2022   Brief hospital course: As per H&P written by Dr. Cathlean Sauer on 07/10/2022 Allison Rollins is a 72 y.o. female with medical history significant of heart failure, atrial fibrillation, COPD, hypertension, and depression who presented with dyspnea and lower extremity edema. Reports 2 days of worsening dyspnea on exertion, to the point where she is having symptoms with minimal efforts. Her dyspnea has been associated with lower extremity edema, orthopnea and lower extremity edema. Positive cough and wheezing.   She had a recent ED visit on 02/07 for nausea and diarrhea. She was treated with IV fluids, antiemetics and was discharged home.  At home she has been taking diuretic therapy every other day as instructed.  She ambulated with a cane.  Because persistent symptoms she came to the ED for further evaluation.    Of note she had a ED visit on 02/02 and was diagnosed with new onset atrial fibrillation, she had RVR, but converted to sinus rhythm in the ED. She was placed on metoprolol, diltiazem and discharged home with instructions to follow up as outpatient with cardiology.  02/22 follow up with Cardiology patient remained in sinus rhythm and no medications changes were made.    In the ED she was found to have atrial fibrillation with RVR and was placed on diltiazem infusion.  As of 07/17/22 pt remains medically stable for discharge to SNF rehab when bed is available and insurance authorization can be obtained  Assessment and Plan: 1)Paroxysmal Atrial fibrillation [facility]) --Cardiology consult appreciated --Weaned off IV Cardizem drip -Avoid oral Cardizem due to low EF -Currently on amiodarone 200 mg twice daily for 20 doses and then 200 mg daily after that -2D echo demonstrating global hypokinesis and ejection fraction 40-45%. -Continue Eliquis for stroke  prevention C/n  Toprol-XL to 50 mg daily from 100 mg due to soft BP As of 07/17/22 pt remains medically stable for discharge to SNF rehab when bed is available and insurance authorization can be obtained   2)Acute on chronic diastolic CHF (congestive heart failure) ([place]) -Last echocardiogram from 10/2021 with preserved LV systolic function with EF 50 to 55%, mild dilatated LV cavity. RV systolic function preserved. Mild dilatation of LA, no significant valvular disease.  -Update 2D echo demonstrating ejection fraction 40 to 45% with global hypokinesis (down from 50 to 55% back in July 2023) --CHF exacerbation most likely arrhythmia mediated exacerbation -Lasix reduced to 40 mg p.o. daily -Aldactone discontinued due to soft BP - Unable to tolerate significant GDMT (ARNI, ARB, MRA held) - holding SGTL2i start due to yeast infections  3)Stage 3a chronic kidney disease (HCC) -Currently stable and at baseline - renally adjust medications, avoid nephrotoxic agents / dehydration  / hypotension  4)Dyslipidemia -Continue  Lipitor  5)GERD (gastroesophageal reflux disease) -Continue PPI. -also PRN antiemetics and maalox ordered.   6)Overweight/Class 1 obesity -Body mass index is 33.8 kg/m. -low calorie diet and portion control discussed with patient.  7)Generalized weakness/deconditioning--- PT eval appreciated, recommends SNF rehab  Dispo:--As of 07/17/22 pt remains medically stable for discharge to SNF rehab when bed is available and insurance authorization can be obtained  Subjective:  -son, grand-son and chaplain at bedside --- questions answered As of 07/17/22 pt remains medically stable for discharge to SNF rehab when bed is available and insurance authorization can be obtained  Physical Exam: Vitals:  07/17/22 0556 07/17/22 0602 07/17/22 0821 07/17/22 1100  BP: 106/72 106/72 107/85 110/76  Pulse: 88 85 77 75  Resp: 18 18  18   Temp: 97.6 F (36.4 C) 97.6 F (36.4 C)  (!) 97.5  F (36.4 C)  TempSrc: Oral   Oral  SpO2: 97% 97%  97%  Weight:      Height:        Physical Exam  Gen:- Awake Alert, in no acute distress  HEENT:- .AT, No sclera icterus Neck-Supple Neck, No JVD,.  Lungs-no wheezing or rales, fair air movement bilaterally  CV- S1, S2 normal, RRR Abd-  +ve B.Sounds, Abd Soft, No tenderness,    Extremity/Skin:-Mostly resolved edema,   good pedal pulses  Psych-affect is flat, oriented x3 Neuro-generalized weakness, no new focal deficits, no tremors  Family Communication: No family at bedside.  Disposition: SNF Rehab Status is: Inpatient   Planned Discharge Destination: Skilled nursing facility  Author: Roxan Hockey, MD 07/17/2022 2:30 PM  For on call review [email or web address].

## 2022-07-17 NOTE — Progress Notes (Signed)
Around 10pm, pt requested for help to go to bed. Patient assisted from chair to bed. Pt. Assisted comfortably in bed. No complaints of pain or concerns. During every rounding, pt. Remained in bed with eyes closed resting.   Patient lying in bed with eyes closed, wanting to return back to sleep from morning vitals and routine care. Safety protocols in place; Call bell left within pt.' reach, belonging with pt.'s reach, bed in lowest position, bed alarm on, and pt. Educated on the usage of call bell for assistance, pt verbalized understanding and agreed.

## 2022-07-18 DIAGNOSIS — I10 Essential (primary) hypertension: Secondary | ICD-10-CM | POA: Diagnosis not present

## 2022-07-18 DIAGNOSIS — I480 Paroxysmal atrial fibrillation: Secondary | ICD-10-CM | POA: Diagnosis not present

## 2022-07-18 DIAGNOSIS — I5033 Acute on chronic diastolic (congestive) heart failure: Secondary | ICD-10-CM | POA: Diagnosis not present

## 2022-07-18 NOTE — Progress Notes (Signed)
Pt confused throughout the night and having visual and auditory hallucinations. Pt reported seeing dogs and cats in room and stated she could hear her family members in the hallway. Pt redirected and reoriented. Pt slept through the night.

## 2022-07-18 NOTE — Progress Notes (Signed)
   07/17/22 2044  Provider Notification  Provider Name/Title Josephine Cables, DO  Date Provider Notified 07/17/22  Time Provider Notified 2045  Method of Notification Page  Notification Reason New onset of dysrhythmia  Type of New Onset of Dysrhythmia Ventricular tachycardia  Symptoms of New Onset of Dysrhythmia Asymptomatic  Provider response No new orders  Date of Provider Response 07/17/22  Time of Provider Response 2100

## 2022-07-18 NOTE — Progress Notes (Signed)
Progress Note   Patient: Allison Rollins [id number] DOB: 09-17-1950 DOA: 07/10/2022     8 DOS: the patient was seen and examined on 07/18/2022   Brief hospital course: As per H&P written by Dr. Cathlean Sauer on 07/10/2022 [name] is a 72 y.o. female with medical history significant of heart failure, atrial fibrillation, COPD, hypertension, and depression who presented with dyspnea and lower extremity edema. Reports 2 days of worsening dyspnea on exertion, to the point where she is having symptoms with minimal efforts. Her dyspnea has been associated with lower extremity edema, orthopnea and lower extremity edema. Positive cough and wheezing.   She had a recent ED visit on 02/07 for nausea and diarrhea. She was treated with IV fluids, antiemetics and was discharged home.  At home she has been taking diuretic therapy every other day as instructed.  She ambulated with a cane.  Because persistent symptoms she came to the ED for further evaluation.    Of note she had a ED visit on 02/02 and was diagnosed with new onset atrial fibrillation, she had RVR, but converted to sinus rhythm in the ED. She was placed on metoprolol, diltiazem and discharged home with instructions to follow up as outpatient with cardiology.  02/22 follow up with Cardiology patient remained in sinus rhythm and no medications changes were made.    In the ED she was found to have atrial fibrillation with RVR and was placed on diltiazem infusion.  As of 07/18/22 pt remains medically stable for discharge to SNF rehab when bed is available and insurance authorization can be obtained  Assessment and Plan: 1)Paroxysmal Atrial fibrillation [facility]) --Cardiology consult appreciated --Weaned off IV Cardizem drip -Avoid oral Cardizem due to low EF -Currently on amiodarone 200 mg twice daily for 20 doses and then 200 mg daily after that -2D echo demonstrating global hypokinesis and ejection fraction 40-45%. -Continue Eliquis for stroke  prevention C/n  Toprol-XL to 50 mg daily from 100 mg due to soft BP As of 07/18/22 pt remains medically stable for discharge to SNF rehab when bed is available and insurance authorization can be obtained   2)Acute on chronic diastolic CHF (congestive heart failure) ([place]) -Last echocardiogram from 10/2021 with preserved LV systolic function with EF 50 to 55%, mild dilatated LV cavity. RV systolic function preserved. Mild dilatation of LA, no significant valvular disease.  -Update 2D echo demonstrating ejection fraction 40 to 45% with global hypokinesis (down from 50 to 55% back in July 2023) --CHF exacerbation most likely arrhythmia mediated exacerbation -Lasix reduced to 40 mg p.o. daily -Aldactone discontinued due to soft BP - Unable to tolerate significant GDMT (ARNI, ARB, MRA held) - holding SGTL2i start due to yeast infections -Voiding okay and weight is stable  3)Stage 3a chronic kidney disease (HCC) -Currently stable and at baseline - renally adjust medications, avoid nephrotoxic agents / dehydration  / hypotension  4)Dyslipidemia -Continue  Lipitor  5)GERD (gastroesophageal reflux disease) -Continue PPI. -also PRN antiemetics and maalox ordered.   6)Overweight/Class 1 obesity -Body mass index is 33.7 kg/m. -low calorie diet and portion control discussed with patient.  7)Generalized weakness/deconditioning--- PT eval appreciated, recommends SNF rehab  Dispo:--As of 07/18/22 pt remains medically stable for discharge to SNF rehab when bed is available and insurance authorization can be obtained  Subjective:  --Had confusion episodes overnight with episodes of visual hallucination -Voiding okay No fever  Or chills   No Nausea, Vomiting or Diarrhea As of 07/18/22 pt remains medically stable for  discharge to SNF rehab when bed is available and insurance authorization can be obtained  Physical Exam: Vitals:   07/18/22 0349 07/18/22 0825 07/18/22 0928 07/18/22 1246  BP:  95/74 103/75  110/74  Pulse: (!) 51 93  89  Resp: 16   17  Temp: 97.6 F (36.4 C)   98 F (36.7 C)  TempSrc: Oral   Oral  SpO2: 91%   93%  Weight:   94.7 kg   Height:        Physical Exam  Gen:- Awake Alert, in no acute distress  HEENT:- [place].AT, No sclera icterus Neck-Supple Neck, No JVD,.  Lungs-no wheezing or rales, fair air movement bilaterally  CV- S1, S2 normal, RRR Abd-  +ve B.Sounds, Abd Soft, No tenderness,    Extremity/Skin:-Mostly resolved edema,   good pedal pulses  Psych-affect is appropriate,  oriented x3 Neuro-generalized weakness, no new focal deficits, no tremors  Family Communication: No family at bedside.  Disposition: SNF Rehab Status is: Inpatient   Planned Discharge Destination: Skilled nursing facility  Author: Roxan Hockey, MD 07/18/2022 4:07 PM  For on call review [email or web address].

## 2022-07-19 DIAGNOSIS — M8588 Other specified disorders of bone density and structure, other site: Secondary | ICD-10-CM | POA: Diagnosis not present

## 2022-07-19 DIAGNOSIS — E876 Hypokalemia: Secondary | ICD-10-CM | POA: Diagnosis not present

## 2022-07-19 DIAGNOSIS — G20A1 Parkinson's disease without dyskinesia, without mention of fluctuations: Secondary | ICD-10-CM | POA: Diagnosis not present

## 2022-07-19 DIAGNOSIS — E785 Hyperlipidemia, unspecified: Secondary | ICD-10-CM | POA: Diagnosis not present

## 2022-07-19 DIAGNOSIS — I5021 Acute systolic (congestive) heart failure: Secondary | ICD-10-CM | POA: Diagnosis not present

## 2022-07-19 DIAGNOSIS — M6281 Muscle weakness (generalized): Secondary | ICD-10-CM | POA: Diagnosis not present

## 2022-07-19 DIAGNOSIS — R498 Other voice and resonance disorders: Secondary | ICD-10-CM | POA: Diagnosis not present

## 2022-07-19 DIAGNOSIS — I5033 Acute on chronic diastolic (congestive) heart failure: Secondary | ICD-10-CM | POA: Diagnosis not present

## 2022-07-19 DIAGNOSIS — I5032 Chronic diastolic (congestive) heart failure: Secondary | ICD-10-CM | POA: Diagnosis not present

## 2022-07-19 DIAGNOSIS — J301 Allergic rhinitis due to pollen: Secondary | ICD-10-CM | POA: Diagnosis not present

## 2022-07-19 DIAGNOSIS — I480 Paroxysmal atrial fibrillation: Secondary | ICD-10-CM | POA: Diagnosis not present

## 2022-07-19 DIAGNOSIS — F411 Generalized anxiety disorder: Secondary | ICD-10-CM | POA: Diagnosis not present

## 2022-07-19 DIAGNOSIS — J310 Chronic rhinitis: Secondary | ICD-10-CM | POA: Diagnosis not present

## 2022-07-19 DIAGNOSIS — J849 Interstitial pulmonary disease, unspecified: Secondary | ICD-10-CM | POA: Diagnosis not present

## 2022-07-19 DIAGNOSIS — I495 Sick sinus syndrome: Secondary | ICD-10-CM | POA: Diagnosis not present

## 2022-07-19 DIAGNOSIS — R0600 Dyspnea, unspecified: Secondary | ICD-10-CM | POA: Diagnosis not present

## 2022-07-19 DIAGNOSIS — J449 Chronic obstructive pulmonary disease, unspecified: Secondary | ICD-10-CM | POA: Diagnosis not present

## 2022-07-19 DIAGNOSIS — R0602 Shortness of breath: Secondary | ICD-10-CM | POA: Diagnosis not present

## 2022-07-19 DIAGNOSIS — R488 Other symbolic dysfunctions: Secondary | ICD-10-CM | POA: Diagnosis not present

## 2022-07-19 DIAGNOSIS — E559 Vitamin D deficiency, unspecified: Secondary | ICD-10-CM | POA: Diagnosis not present

## 2022-07-19 DIAGNOSIS — N1831 Chronic kidney disease, stage 3a: Secondary | ICD-10-CM | POA: Diagnosis not present

## 2022-07-19 DIAGNOSIS — I10 Essential (primary) hypertension: Secondary | ICD-10-CM | POA: Diagnosis not present

## 2022-07-19 DIAGNOSIS — K219 Gastro-esophageal reflux disease without esophagitis: Secondary | ICD-10-CM | POA: Diagnosis not present

## 2022-07-19 DIAGNOSIS — G9009 Other idiopathic peripheral autonomic neuropathy: Secondary | ICD-10-CM | POA: Diagnosis not present

## 2022-07-19 DIAGNOSIS — R0609 Other forms of dyspnea: Secondary | ICD-10-CM | POA: Diagnosis not present

## 2022-07-19 DIAGNOSIS — G2581 Restless legs syndrome: Secondary | ICD-10-CM | POA: Diagnosis not present

## 2022-07-19 DIAGNOSIS — F339 Major depressive disorder, recurrent, unspecified: Secondary | ICD-10-CM | POA: Diagnosis not present

## 2022-07-19 DIAGNOSIS — F0150 Vascular dementia without behavioral disturbance: Secondary | ICD-10-CM | POA: Diagnosis not present

## 2022-07-19 DIAGNOSIS — J189 Pneumonia, unspecified organism: Secondary | ICD-10-CM | POA: Diagnosis not present

## 2022-07-19 DIAGNOSIS — I517 Cardiomegaly: Secondary | ICD-10-CM | POA: Diagnosis not present

## 2022-07-19 DIAGNOSIS — R2681 Unsteadiness on feet: Secondary | ICD-10-CM | POA: Diagnosis not present

## 2022-07-19 LAB — BASIC METABOLIC PANEL
Anion gap: 11 (ref 5–15)
BUN: 22 mg/dL (ref 8–23)
CO2: 28 mmol/L (ref 22–32)
Calcium: 8.8 mg/dL — ABNORMAL LOW (ref 8.9–10.3)
Chloride: 100 mmol/L (ref 98–111)
Creatinine, Ser: 1.11 mg/dL — ABNORMAL HIGH (ref 0.44–1.00)
GFR, Estimated: 53 mL/min — ABNORMAL LOW (ref >60)
Glucose, Bld: 105 mg/dL — ABNORMAL HIGH (ref 70–99)
Potassium: 4.3 mmol/L (ref 3.5–5.1)
Sodium: 139 mmol/L (ref 135–145)

## 2022-07-19 LAB — SARS CORONAVIRUS 2 BY RT PCR: SARS Coronavirus 2 by RT PCR: NEGATIVE

## 2022-07-19 MED ORDER — FUROSEMIDE 40 MG PO TABS: 40.0000 mg | ORAL_TABLET | Freq: Every day | ORAL | 1 refills | Status: DC

## 2022-07-19 MED ORDER — AMIODARONE HCL 200 MG PO TABS: 200.0000 mg | ORAL_TABLET | Freq: Two times a day (BID) | ORAL | 0 refills | Status: DC

## 2022-07-19 MED ORDER — ALUM & MAG HYDROXIDE-SIMETH 200-200-20 MG/5ML PO SUSP: 30.0000 mL | Freq: Four times a day (QID) | ORAL | 0 refills | Status: AC | PRN

## 2022-07-19 MED ORDER — AMIODARONE HCL 200 MG PO TABS: 200.0000 mg | ORAL_TABLET | Freq: Every day | ORAL | 1 refills | Status: DC

## 2022-07-19 MED ORDER — METOPROLOL SUCCINATE ER 50 MG PO TB24: 50.0000 mg | ORAL_TABLET | Freq: Every day | ORAL | 1 refills | Status: DC

## 2022-07-19 NOTE — Discharge Summary (Signed)
Physician Discharge Summary   Patient: Allison Rollins MRN: [id number] DOB: [birth year]  Admit date:     07/10/2022  Discharge date: 07/19/22  Discharge Physician: Deatra James   PCP: Gwenlyn Perking, FNP   Recommendations at discharge:   Follow-up with the PCP in 1 week Follow-up with cardiology in 1-4 weeks Continue currently recommended medication by cardiology including amiodarone 200 mg twice a day till 07/25/2018, then 200 mg once a day  Discharge Diagnoses: Principal Problem:   Atrial fibrillation ([place]) Active Problems:   Acute on chronic diastolic CHF (congestive heart failure) (HCC)   Essential hypertension   Stage 3a chronic kidney disease ([place])   Dyslipidemia   GERD (gastroesophageal reflux disease)  Resolved Problems:   * No resolved hospital problems. *   Allison Rollins is a 72 y.o. female with medical history significant of heart failure, atrial fibrillation, COPD, hypertension, and depression who presented with dyspnea and lower extremity edema. Reports 2 days of worsening dyspnea on exertion, to the point where she is having symptoms with minimal efforts. Her dyspnea has been associated with lower extremity edema, orthopnea and lower extremity edema. Positive cough and wheezing.   She had a recent ED visit on 02/07 for nausea and diarrhea. She was treated with IV fluids, antiemetics and was discharged home.  At home she has been taking diuretic therapy every other day as instructed.  She ambulated with a cane.  Because persistent symptoms she came to the ED for further evaluation.    Of note she had a ED visit on 02/02 and was diagnosed with new onset atrial fibrillation, she had RVR, but converted to sinus rhythm in the ED. She was placed on metoprolol, diltiazem and discharged home with instructions to follow up as outpatient with cardiology.  02/22 follow up with Cardiology patient remained in sinus rhythm and no medications changes were made.    In the ED  she was found to have atrial fibrillation with RVR and was placed on diltiazem infusion.      1)Paroxysmal Atrial fibrillation [facility]) --Cardiology consult appreciated --Weaned off IV Cardizem drip -Avoid oral Cardizem due to low EF -Currently on amiodarone 200 mg twice daily for 20 doses and then 200 mg daily after that Amiodarone 200 mg p.o. twice daily till 07/25/2022, then 200 mg daily -2D echo demonstrating global hypokinesis and ejection fraction 40-45%. -Continue Eliquis for stroke prevention C/n  Toprol-XL to 50 mg daily from 100 mg due to soft BP      2)Acute on chronic diastolic CHF (congestive heart failure) (HCC) -Last echocardiogram from 10/2021 with preserved LV systolic function with EF 50 to 55%, mild dilatated LV cavity. RV systolic function preserved. Mild dilatation of LA, no significant valvular disease.  -Update 2D echo demonstrating ejection fraction 40 to 45% with global hypokinesis (down from 50 to 55% back in July 2023) --CHF exacerbation most likely arrhythmia mediated exacerbation -Lasix reduced to 40 mg p.o. daily -Aldactone discontinued due to soft BP - Unable to tolerate significant GDMT (ARNI, ARB, MRA held) - holding SGTL2i start due to yeast infections   3)Stage 3a chronic kidney disease (HCC) -Currently stable and at baseline - renally adjust medications, avoid nephrotoxic agents / dehydration  / hypotension   4)Dyslipidemia -Continue  Lipitor   5)GERD (gastroesophageal reflux disease) -Continue PPI. -also PRN antiemetics and maalox ordered.    6)Overweight/Class 1 obesity -Body mass index is 33.8 kg/m. -low calorie diet and portion control discussed with patient.  7)Generalized weakness/deconditioning--- PT eval appreciated, recommends SNF rehab   Dispo:--As of 07/17/22 pt remains medically stable for discharge to SNF rehab when bed is available and insurance authorization can be obtained     Consultants: Cardiology Procedures  performed: 2D echocardiogram Disposition: Skilled nursing facility Diet recommendation:  Discharge Diet Orders (From admission, onward)     Start     Ordered   07/19/22 0000  Diet - low sodium heart healthy        07/19/22 1156           Cardiac diet DISCHARGE MEDICATION: Allergies as of 07/19/2022       Reactions   Farxiga [dapagliflozin] Swelling   Penicillins Itching, Rash   Has patient had a PCN reaction causing immediate rash, facial/tongue/throat swelling, SOB or lightheadedness with hypotension: no Has patient had a PCN reaction causing severe rash involving mucus membranes or skin necrosis: no Has patient had a PCN reaction that required hospitalization: no Has patient had a PCN reaction occurring within the last 10 years: no If all of the above answers are "NO", then may proceed with Cephalosporin use.   Sulfa Antibiotics Rash        Medication List     STOP taking these medications    famotidine 20 MG tablet Commonly known as: PEPCID   lisinopril 10 MG tablet Commonly known as: ZESTRIL   metoprolol tartrate 25 MG tablet Commonly known as: LOPRESSOR       TAKE these medications    Accu-Chek Aviva Plus test strip Generic drug: glucose blood Test sugars daily   acetaminophen 500 MG tablet Commonly known as: TYLENOL Take 1 tablet (500 mg total) by mouth every 6 (six) hours as needed.   albuterol (2.5 MG/3ML) 0.083% nebulizer solution Commonly known as: PROVENTIL Take 3 mLs (2.5 mg total) by nebulization every 6 (six) hours as needed for wheezing or shortness of breath.   albuterol 108 (90 Base) MCG/ACT inhaler Commonly known as: VENTOLIN HFA Inhale 2 puffs into the lungs every 6 (six) hours as needed for wheezing or shortness of breath.   Allergy Relief Cetirizine 10 MG tablet Generic drug: cetirizine Take 1 tablet by mouth every day   alum & mag hydroxide-simeth 200-200-20 MG/5ML suspension Commonly known as: MAALOX/MYLANTA Take 30 mLs by  mouth every 6 (six) hours as needed for indigestion or heartburn.   amiodarone 200 MG tablet Commonly known as: PACERONE Take 1 tablet (200 mg total) by mouth 2 (two) times daily for 6 days.   amiodarone 200 MG tablet Commonly known as: PACERONE Take 1 tablet (200 mg total) by mouth daily. Start taking on: July 26, 2022   apixaban 5 MG Tabs tablet Commonly known as: ELIQUIS Take 1 tablet (5 mg total) by mouth 2 (two) times daily.   atorvastatin 40 MG tablet Commonly known as: LIPITOR Take 1 tablet by mouth every day   B-D SINGLE USE SWABS REGULAR Pads Test sugars daily   CALCIUM PO Take 1 tablet by mouth daily.   cholecalciferol 25 MCG (1000 UNIT) tablet Commonly known as: VITAMIN D3 Take 1,000 Units by mouth daily.   fluticasone 50 MCG/ACT nasal spray Commonly known as: FLONASE Use 2 sprays into each nostril every day What changed: See the new instructions.   furosemide 40 MG tablet Commonly known as: LASIX Take 1 tablet (40 mg total) by mouth daily. Start taking on: July 20, 2022 What changed:  medication strength how much to take when to take this reasons to  take this   gabapentin 300 MG capsule Commonly known as: NEURONTIN Take 2 capsules by mouth 3 times a day   metoprolol succinate 50 MG 24 hr tablet Commonly known as: TOPROL-XL Take 1 tablet (50 mg total) by mouth daily. Take with or immediately following a meal. Start taking on: July 20, 2022   nitroGLYCERIN 0.4 MG SL tablet Commonly known as: NITROSTAT Place 1 tablet (0.4 mg total) under the tongue every 5 (five) minutes as needed for chest pain.   pantoprazole 40 MG tablet Commonly known as: PROTONIX Take 1 tablet by mouth twice daily   Potassium 99 MG Tabs Take 1 tablet by mouth daily.   pramipexole 0.5 MG tablet Commonly known as: MIRAPEX Take 1 tablet by mouth at bedtime   sertraline 100 MG tablet Commonly known as: ZOLOFT Take 2 tablets (200 mg total) by mouth daily.   VITAMIN C  PO Take 1 tablet by mouth 3 (three) times daily.        Contact information for after-discharge care     [facility] .   Service: Skilled Nursing Contact information: [address] [phone]                    Discharge Exam: Filed Weights   07/12/22 1336 07/14/22 0916 07/18/22 0928  Weight: 96.8 kg 95 kg 94.7 kg        General:  AAO x 3,  cooperative, no distress;   HEENT:  Normocephalic, PERRL, otherwise with in Normal limits   Neuro:  CNII-XII intact. , normal motor and sensation, reflexes intact   Lungs:   Clear to auscultation BL, Respirations unlabored,  No wheezes / crackles  Cardio:    S1/S2, RRR, No murmure, No Rubs or Gallops   Abdomen:  Soft, non-tender, bowel sounds active all four quadrants, no guarding or peritoneal signs.  Muscular  skeletal:  Limited exam -global generalized weaknesses Gait abnormality, difficulty ambulating independently - in bed, able to move all 4 extremities,   2+ pulses,  symmetric, No pitting edema  Skin:  Dry, warm to touch, negative for any Rashes,  Wounds: Please see nursing documentation          Condition at discharge: fair  The results of significant diagnostics from this hospitalization (including imaging, microbiology, ancillary and laboratory) are listed below for reference.   Imaging Studies: ECHOCARDIOGRAM COMPLETE  Result Date: 07/11/2022    ECHOCARDIOGRAM REPORT   Patient Name:   Allison Rollins Date of Exam: 07/11/2022 Medical Rec #:  [id number]     Height:       66.0 in Accession #:    [id number]    Weight:       172.8 lb Date of Birth:  October 20, 1950     BSA:          1.880 m Patient Age:    39 years      BP:           107/59 mmHg Patient Gender: F             HR:           66 bpm. Exam Location:  [facility] Procedure: 2D Echo, Cardiac Doppler and Color Doppler Indications:     Dyspnea R06.00  History:         Patient has  prior history of Echocardiogram examinations, most  recent 11/02/2021. CHF, COPD, Arrythmias:Atrial Fibrillation;                  Risk Factors:Hypertension, Dyslipidemia and Diabetes. Morbid                  Obesity.  Sonographer:     Alvino Chapel RCS Referring Phys:  [id number] [place] Diagnosing Phys: [facility]  Sonographer Comments: Image acquisition challenging due to patient body habitus. IMPRESSIONS  1. Cannot fully assess wall motion; windows are challenging. Left ventricular ejection fraction, by estimation, is 40 to 45%. The left ventricle has mildly decreased function. The left ventricle demonstrates global hypokinesis. The left ventricular internal cavity size was mildly dilated. There is mild concentric left ventricular hypertrophy. Left ventricular diastolic parameters are indeterminate.  2. Right ventricular systolic function is mildly reduced. The right ventricular size is not well visualized. Tricuspid regurgitation signal is inadequate for assessing PA pressure.  3. Left atrial size was moderately dilated.  4. A small pericardial effusion is present.  5. The mitral valve is degenerative. No evidence of mitral valve regurgitation.  6. Aortic valve regurgitation is not visualized.  7. The inferior vena cava is dilated in size with >50% respiratory variability, suggesting right atrial pressure of 8 mmHg. Conclusion(s)/Recommendation(s): EF appears to be mildly decreased from prior study; windows are challenging. FINDINGS  Left Ventricle: Cannot fully assess wall motion; windows are challenging. Left ventricular ejection fraction, by estimation, is 40 to 45%. The left ventricle has mildly decreased function. The left ventricle demonstrates global hypokinesis. Definity contrast agent was given IV to delineate the left ventricular endocardial borders. The left ventricular internal cavity size was mildly dilated. There is mild concentric left ventricular hypertrophy. Left  ventricular diastolic parameters are indeterminate. Right Ventricle: The right ventricular size is not well visualized. Right ventricular systolic function is mildly reduced. Tricuspid regurgitation signal is inadequate for assessing PA pressure. Left Atrium: Left atrial size was moderately dilated. Right Atrium: Right atrial size was not well visualized. Pericardium: A small pericardial effusion is present. Mitral Valve: The mitral valve is degenerative in appearance. No evidence of mitral valve regurgitation. Tricuspid Valve: Tricuspid valve regurgitation is not demonstrated. Aortic Valve: Aortic valve regurgitation is not visualized. Pulmonic Valve: Pulmonic valve regurgitation is not visualized. Aorta: The aortic root and ascending aorta are structurally normal, with no evidence of dilitation. Venous: The inferior vena cava is dilated in size with greater than 50% respiratory variability, suggesting right atrial pressure of 8 mmHg. IAS/Shunts: The interatrial septum was not well visualized.  LEFT VENTRICLE PLAX 2D LVIDd:         5.90 cm LVIDs:         4.20 cm LV PW:         1.10 cm LV IVS:        1.00 cm LVOT diam:     1.70 cm LV SV:         26 LV SV Index:   14 LVOT Area:     2.27 cm  RIGHT VENTRICLE TAPSE (M-mode): 1.2 cm LEFT ATRIUM             Index        RIGHT ATRIUM           Index LA diam:        4.10 cm 2.18 cm/m   RA Area:     22.10 cm LA Vol (A2C):   71.1 ml 37.83 ml/m  RA Volume:   69.50 ml  36.98 ml/m LA Vol (A4C):   80.5 ml 42.83 ml/m LA Biplane Vol: 80.3 ml 42.72 ml/m  AORTIC VALVE LVOT Vmax:   64.65 cm/s LVOT Vmean:  48.450 cm/s LVOT VTI:    0.116 m  AORTA Ao Root diam: 2.90 cm MITRAL VALVE MV Area (PHT): 3.96 cm     SHUNTS MV Decel Time: 192 msec     Systemic VTI:  0.12 m MV E velocity: 118.50 cm/s  Systemic Diam: 1.70 cm Phineas Inches Electronically signed by Phineas Inches Signature Date/Time: 07/11/2022/2:22:30 PM    Final (Updated)    DG Chest Port 1 View  Result Date:  07/10/2022 CLINICAL DATA:  Shortness of breath, chest pain EXAM: PORTABLE CHEST 1 VIEW COMPARISON:  Chest radiograph dated 06/02/2022. FINDINGS: The heart is enlarged. Vascular calcifications are seen in the aortic arch. There is mild left basilar atelectasis. The right lung is clear. There is no pleural effusion or pneumothorax. Degenerative changes are seen in the spine. IMPRESSION: 1. Mild left basilar atelectasis. 2. Cardiomegaly. Electronically Signed   By: Zerita Boers M.D.   On: 07/10/2022 17:17    Microbiology: Results for orders placed or performed during the hospital encounter of 07/10/22  MRSA Next Gen by PCR, Nasal     Status: None   Collection Time: 07/11/22  1:44 PM   Specimen: Nasal Mucosa; Nasal Swab  Result Value Ref Range Status   MRSA by PCR Next Gen NOT DETECTED NOT DETECTED Final    Comment: (NOTE) The GeneXpert MRSA Assay (FDA approved for NASAL specimens only), is one component of a comprehensive MRSA colonization surveillance program. It is not intended to diagnose MRSA infection nor to guide or monitor treatment for MRSA infections. Test performance is not FDA approved in patients less than 110 years old. Performed at [facility], [address] [place], [place] [address]     Labs: CBC: No results for input(s): "WBC", "NEUTROABS", "HGB", "HCT", "MCV", "PLT" in the last 168 hours. Basic Metabolic Panel: Recent Labs  Lab 07/13/22 0458 07/15/22 0402 07/17/22 0512 07/19/22 0405  NA 139 142 139 139  K 3.5 3.4* 3.8 4.3  CL 95* 98 101 100  CO2 33* 33* 27 28  GLUCOSE 123* 118* 111* 105*  BUN 18 23 23 22   CREATININE 1.07* 1.06* 1.04* 1.11*  CALCIUM 8.8* 8.8* 8.8* 8.8*  PHOS  --   --  3.5  --    Liver Function Tests: Recent Labs  Lab 07/17/22 0512  ALBUMIN 3.4*   CBG: No results for input(s): "GLUCAP" in the last 168 hours.  Discharge time spent: greater than 40 minutes.  Signed: Deatra James, MD [facility] 07/19/2022

## 2022-07-19 NOTE — Progress Notes (Signed)
Mobility Specialist Progress Note:    07/19/22 1100  Mobility  Activity Ambulated with assistance in hallway  Level of Assistance Standby assist, set-up cues, supervision of patient - no hands on  Assistive Device Front wheel walker  Distance Ambulated (ft) 280 ft  Activity Response Tolerated well  Mobility Referral Yes  $Mobility charge 1 Mobility   Pt eager for mobility session. Tolerated well, asx throughout. Returned pt to room, sitting up in chair. All needs met, call bell in reach.   Royetta Crochet Mobility Specialist Please contact via Solicitor or  Rehab office at [phone]

## 2022-07-19 NOTE — Care Management Important Message (Signed)
Important Message  Patient Details  Name: Allison Rollins MRN: [id number] Date of Birth: May 12, 1950   Medicare Important Message Given:  Yes     Tommy Medal 07/19/2022, 1:48 PM

## 2022-07-19 NOTE — TOC Transition Note (Signed)
Transition of Care [facility]) - CM/SW Discharge Note   Patient Details  Name: Allison Rollins MRN: [id number] Date of Birth: 04-24-1951  Transition of Care [facility]) CM/SW Contact:  Iona Beard, [place] Phone Number: 07/19/2022, 12:59 PM   Clinical Narrative:    CSW updated that pts insurance Josem Kaufmann has been approved for SNF. CSW updated Kerri with [facility] who is able to accept pt today. CSW provided RN with room and report numbers. Pts daughter in law updated of plan for D/C. TOC signing off.   Final next level of care: Skilled Nursing Facility Barriers to Discharge: Barriers Resolved   Patient Goals and CMS Choice CMS Medicare.gov Compare Post Acute Care list provided to:: Patient Choice offered to / list presented to : Patient  Discharge Placement                Patient chooses bed at: [facility] Patient to be transferred to facility by: Facility staff Name of family member notified: April Patient and family notified of of transfer: 07/19/22  Discharge Plan and Services Additional resources added to the After Visit Summary for   In-house Referral: Clinical Social Work   Post Acute Care Choice: [address]                               Social Determinants of Health ([place]) Interventions [place]: No Food Insecurity (04/16/2022)  Recent Concern: [place] Present (02/24/2022)  Housing: Low Risk  (04/16/2022)  Transportation Needs: No Transportation Needs (04/16/2022)  Utilities: Not At Risk (04/16/2022)  Recent Concern: Utilities - At Risk (02/24/2022)  Alcohol Screen: Low Risk  (04/16/2022)  Depression (PHQ2-9): Low Risk  (04/16/2022)  Recent Concern: Depression (PHQ2-9) - High Risk (02/24/2022)  Financial Resource Strain: Medium Risk (04/16/2022)  Physical Activity: Insufficiently Active (04/16/2022)  Social Connections: Moderately Integrated (04/16/2022)  Recent Concern: Social Connections -  Socially Isolated (03/24/2022)  Stress: No Stress Concern Present (04/16/2022)  Tobacco Use: Medium Risk (07/15/2022)     Readmission Risk Interventions     No data to display

## 2022-07-19 NOTE — Progress Notes (Signed)
Report given and accepted by Asa Lente, RN at [name] center. IV removed and patient taken to [facility].

## 2022-07-20 ENCOUNTER — Ambulatory Visit: Payer: Self-pay | Admitting: *Deleted

## 2022-07-20 ENCOUNTER — Encounter: Payer: Self-pay | Admitting: *Deleted

## 2022-07-20 ENCOUNTER — Non-Acute Institutional Stay (SKILLED_NURSING_FACILITY): Payer: Medicare HMO | Admitting: Internal Medicine

## 2022-07-20 ENCOUNTER — Encounter: Payer: Self-pay | Admitting: Internal Medicine

## 2022-07-20 ENCOUNTER — Ambulatory Visit (INDEPENDENT_AMBULATORY_CARE_PROVIDER_SITE_OTHER): Payer: Medicare HMO | Admitting: *Deleted

## 2022-07-20 DIAGNOSIS — I5033 Acute on chronic diastolic (congestive) heart failure: Secondary | ICD-10-CM | POA: Diagnosis not present

## 2022-07-20 DIAGNOSIS — I4891 Unspecified atrial fibrillation: Secondary | ICD-10-CM

## 2022-07-20 DIAGNOSIS — I10 Essential (primary) hypertension: Secondary | ICD-10-CM | POA: Diagnosis not present

## 2022-07-20 DIAGNOSIS — N1831 Chronic kidney disease, stage 3a: Secondary | ICD-10-CM

## 2022-07-20 DIAGNOSIS — I5032 Chronic diastolic (congestive) heart failure: Secondary | ICD-10-CM

## 2022-07-20 DIAGNOSIS — I480 Paroxysmal atrial fibrillation: Secondary | ICD-10-CM

## 2022-07-20 NOTE — Patient Outreach (Signed)
Care Coordination   Follow Up Visit Note   07/20/2022  Name: Allison Rollins MRN: [id number] DOB: [birth year]  Allison Rollins is a 72 y.o. year old female who sees Allison Perking, FNP for primary care. I spoke with daughter-in-law, Allison Rollins by phone today.  What matters to the patients health and wellness today?  Find Help in My Community.   Goals Addressed               This Visit's Progress     Find Help in My Community. (pt-stated)   On track     Care Coordination Interventions:  Interventions Today    Flowsheet Row Most Recent Value  Chronic Disease   Chronic disease during today's visit Hypertension (HTN), Chronic Obstructive Pulmonary Disease (COPD), Other, Chronic Kidney Disease/End Stage Renal Disease (ESRD)  [Parkinson's Disease, Neuropathy, Recurrent Depression & Generalized Anxiety Disorder]  General Interventions   General Interventions Discussed/Reviewed General Interventions Discussed, General Interventions Reviewed, Annual Eye Exam, Labs, Durable Medical Equipment (DME), Vaccines, Health Screening, Community Resources, Doctor Visits, Level of Care  Labs Hgb A1c annually  Vaccines COVID-19, Flu, Pneumonia, RSV, Shingles  Doctor Visits Discussed/Reviewed Doctor Visits Discussed, Doctor Visits Reviewed, Annual Wellness Visits, PCP, Specialist  Health Screening Colonoscopy, Bone Density, Mammogram  Durable Medical Equipment (DME) BP Cuff, Walker, Community education officer  PCP/Specialist Visits Compliance with follow-up visit  Level of Care Applications, Assisted Living, Personal Care Academic librarian, Personal Care Services, Other  [place] Application]  Exercise Interventions   Exercise Discussed/Reviewed Exercise Discussed, Exercise Reviewed, Physical Activity, Assistive device use and maintanence  Physical Activity Discussed/Reviewed Physical Activity Discussed, Physical Activity Reviewed, Types of exercise   Education Interventions   Education Provided Provided Engineer, site, Provided Education, Provided Web-based Education  Provided Verbal Education On Nutrition, Eye Care, Mental Health/Coping with Illness, Applications, Exercise, Medication, Personal assistant, [employer], When to see the doctor  Applications Medicaid, Personal Care Services, Other  [place] Discussed, Mental Health Reviewed, Coping Strategies, Crisis, Anxiety, Depression  Nutrition Interventions   Nutrition Discussed/Reviewed Nutrition Discussed, Nutrition Reviewed, Fluid intake, Portion sizes, Decreasing sugar intake, Decreasing salt, Decreasing fats, Increaing proteins  Pharmacy Interventions   Pharmacy Dicussed/Reviewed Pharmacy Topics Discussed, Pharmacy Topics Reviewed, Medication Adherence, Affording Medications  Safety Interventions   Safety Discussed/Reviewed Safety Discussed, Safety Reviewed, Fall Risk  Advanced Directive Interventions   Advanced Directives Discussed/Reviewed Advanced Directives Discussed, Advanced Directives Reviewed     Active Listening & Reflection Utilized.  Verbalization of Feelings Encouraged.  Emotional Support Provided. Feelings of Caregiver Burnout Validated. Caregiver Stress Acknowledged. Caregiver Resources Reviewed. Caregiver Support Groups Mailed. Self-Enrollment in Caregiver Support Group of Interest Emphasized. Problem Solving Interventions Enacted. Solution-Focused Strategies Employed. Task-Centered Solutions Implemented. Acceptance & Commitment Therapy Initiated. Cognitive Behavioral Therapy Engaged. Client Centered Therapy Performed. CSW Continued Collaboration with Daughter-in-Law, Allison Rollins to Confirm Patient's Admission to [facility] [phone]), for Short-Term Rehabilitative Services, on 07/19/2022. CSW Continued  Collaboration with Daughter-in-Law, Allison Rollins to Encourage Consideration of Placement for Patient into [place], Upon Completion of Rehabilitative Services at [facility] [phone]). CSW Continued Collaboration with Daughter-in-Law, Allison Rollins to Confirm Receipt & Review List of [place] in [place]. CSW Continued Collaboration with Daughter-in-Law, Allison Rollins to [employer] with Allison Rollins to The Following List of [employer]  Resources, In An Effort to [place] for Patient: ~ [employer] [phone]# [phone]) ~ [employer] [phone]) ~ Lot [phone] [phone]) ~ [place] [phone]# [phone]) ~ [place] [phone]) ~ [employer] [phone]# [phone]) ~ [employer] [phone]) ~ [place] (# [phone]) ~ [place] (# [phone]) ~ [place] [phone]) ~ Aging, Branch of [place] [phone]) [place] with Daughter-in-Law, Allison Rollins to [employer] with Completion of Application for [employer] & Submission to [employer] [place] [phone]), for Processing. ~ 2023 Medicaid Tips ~ Medicaid Application CSW Continued Collaboration with Daughter-in-Law, Allison Rollins to [employer] with Completion of Application for [place] to [employer] [phone]), for Processing. ~ [place] Instructions ~ [place] Application ~ [place] Provider List CSW Continued Collaboration with Daughter-in-Law, Allison Rollins to [employer] with Completion of Application for [place] to [facility] [phone]), for Processing. ~ [place] Application  CSW Continued Collaboration with Daughter-in-Law, Allison Rollins to [employer] with CSW (# [phone]), if She Has Questions, Needs Assistance with Application Completion & Submission, or If Additional Social Work Needs Are Identified Between Now & Our Next Scheduled Telephone Follow-Up [employer].      SDOH assessments and interventions completed:  Yes.  Care Coordination Interventions:  Yes, provided.   Follow up plan: Follow up call scheduled for 08/04/2022 at 9:15 am.  Encounter Outcome:  Pt. Visit Completed.   Nat Christen, BSW, MSW, LCSW  Licensed Education officer, environmental [facility]  Mailing [place] [address] [place], [place] [address] Physical Address-[address] [place],  [address] Toll Free Main # [phone] Fax # [phone] Cell # [phone] [email or web address]

## 2022-07-20 NOTE — Progress Notes (Signed)
NURSING HOME LOCATION:  [facility]  ROOM NUMBER:  132 P  CODE STATUS:  Full Code  [id number] Lilia Pro FNP  This is a comprehensive admission note to this SNFperformed on this date less than 30 days from date of admission. Included are preadmission medical/surgical history; reconciled medication list; family history; social history and comprehensive review of systems.  Corrections and additions to the records were documented. Comprehensive physical exam was also performed. Additionally a clinical summary was entered for each active diagnosis pertinent to this admission in the Problem List to enhance continuity of care.  HPI: She was hospitalized 3/16 - 07/19/2022 presenting with dyspnea and lower extremity edema.  The dyspnea had progressed over 2 days PTA & was present with even minimal exertion. Her lower extremity edema persisted despite taking a diuretic every other day since prior ER visit 2/7. She also had been seen in the ED 2/2 & diagnosed with new onset atrial fibrillation with RVR.  The A-fib converted to sinus rhythm in the ED.  At that time metoprolol and diltiazem were initiated.  Cardiology saw her in follow-up 2/22 and she remained in sinus rhythm. In the ED 3/16 she was found to have A-fib with RVR; diltiazem infusion was initiated.  2D echo demonstrated global hypokinesis and ejection fraction of 40-45%.  BNP was 767.  Troponin was 11.  Cardiology consulted; the Cardizem drip was subsequently weaned.Maintenance diltiazem was to be avoided due to low EF.  Amiodarone 200 mg twice daily x 10 days followed by 200 mg daily as maintenance was prescribed.  Eliquis was initiated for stroke prophylaxis.  Blood pressure remained soft necessitating dropping the beta-blocker from 100 mg to 50 mg daily and stopping Aldactone.  Lasix was decreased to 40 mg daily as maintenance. While hospitalized creatinine ranged from 0.94 to a peak of 1.11 at discharge.  At presentation GFR  was 60; nadir GFR was 51.  Final value was 53 indicating CKD stage IIIa.  Total protein was 6.4 and albumin 3.4 indicating protein/caloric malnutrition. SGTL2 inhibitor was held because of yeast infections.  While hospitalized glucoses ranged from 105 up to a high of 123. The most recent A1c on record was 5.8% on 09/05/2021. The most recent TSH was 1.000 on 02/24/2022. PT/OT consulted and recommended SNF placement for rehab.  Past medical and surgical history: History of congestive heart failure, history of asthma, COPD, essential hypertension, history of nephrolithiasis, Parkinson's disease, and history of osteopenia in the context of vitamin D deficiency. Surgeries and procedures include cholecystectomy, colonoscopy, EGD and esophageal manometry, savory dilation, and kidney stone surgery.  Social history: Nondrinker; former smoker with history of 7.5 pack years consumption.  Family history: Reviewed   Review of systems: She has a good comprehension of the diagnoses & recommendations related to her hospitalization.  She states that she had "a lot of fluid around the heart, congestive heart failure."  She also mention she has COPD with intermittent cough with scant yellow sputum.  At present her cardiopulmonary symptoms are dramatically improved.  She stated "I am fine, ready to run a marathon."  She does describe cramping in her toes and numbness in her feet intermittently.  She also admits to depression on occasion.  She describes easy bruising without other bleeding dyscrasias.  She relates this to her anticoagulant.  Constitutional: No fever, significant weight change  Eyes: No redness, discharge, pain, vision change ENT/mouth: No nasal congestion, purulent discharge, earache, change in hearing, sore throat  Cardiovascular: No  chest pain, palpitations, paroxysmal nocturnal dyspnea, claudication Respiratory: No hemoptysis, significant snoring, apnea  Gastrointestinal: No heartburn, dysphagia,  abdominal pain, nausea /vomiting, rectal bleeding, melena, change in bowels Genitourinary: No dysuria, hematuria, pyuria, incontinence, nocturia Musculoskeletal: No joint stiffness, joint swelling, weakness, pain Dermatologic: No rash, pruritus, change in appearance of skin Neurologic: No dizziness, headache, syncope, seizures Psychiatric: No significant insomnia, anorexia Endocrine: No change in hair/skin/nails, excessive thirst, excessive hunger, excessive urination  Hematologic/lymphatic: No significant lymphadenopathy, abnormal bleeding Allergy/immunology: No itchy/watery eyes, significant sneezing, urticaria, angioedema  Physical exam:  Pertinent or positive findings: Ptosis is present on the right greater than left.  She is wearing only the upper plate; she is completely edentulous.  Heart sounds are markedly distant, almost indiscernible.  The rhythm appears to be slightly irregular.  Breath sounds are also decreased.  She has isolated low-grade expiratory rhonchi at the bases.  Abdomen is protuberant.  Pedal pulses are not palpable.  She is wearing compression hose which controls the peripheral edema.  She has scattered low-grade bruising over the dorsum of the hands.  General appearance: Adequately nourished; no acute distress, increased work of breathing is present.   Lymphatic: No lymphadenopathy about the head, neck, axilla. Eyes: No conjunctival inflammation or lid edema is present. There is no scleral icterus. Ears:  External ear exam shows no significant lesions or deformities.   Nose:  External nasal examination shows no deformity or inflammation. Nasal mucosa are pink and moist without lesions, exudates Neck:  No thyromegaly, masses, tenderness noted.    Heart:  No gallop, murmur, click, rub.  Lungs:  without wheezes, rales, rubs. Abdomen: Bowel sounds are normal.  Abdomen is soft and nontender with no organomegaly, hernias, masses. GU: Deferred  Extremities:  No cyanosis,  clubbing. Neurologic exam: Balance, Rhomberg, finger to nose testing could not be completed due to clinical state Skin: Warm & dry w/o tenting. No significant rash.  See clinical summary under each active problem in the Problem List with associated updated therapeutic plan

## 2022-07-20 NOTE — Assessment & Plan Note (Addendum)
Blood pressure here at the SNF have ranged from  a low  of 107/74 up to 112/72.  Antihypertensive medication will be adjusted if the blood pressure average remains clinically soft.

## 2022-07-20 NOTE — Chronic Care Management (AMB) (Signed)
Chronic Care Management   CCM RN Visit Note  07/20/2022 Name: Allison Rollins MRN: [id number] DOB: 01-17-1951  Subjective: Allison Rollins is a 72 y.o. year old female who is a primary care patient of Gwenlyn Perking, FNP. The patient was referred to the Chronic Care Management team for assistance with care management needs subsequent to provider initiation of CCM services and plan of care.    Today's Visit:  Collaboration with LCSW  for  [employer]- pt is currently resident in short term rehab .        Goals Addressed             This Visit's Progress    CCM (CONGESTIVE HEART FAILURE) EXPECTED OUTCOME: MONITOR, SELF-MANAGE AND REDUCE SYMPTOMS OF CONGESTIVE HEART FAILURE       Current Barriers:  Knowledge Deficits related to Congestive Heart Failure Care Coordination needs related to depression in a patient with Congestive Heart Failure Chronic Disease Management support and education needs related to management of Congestive Heart Failure and diet No Advanced Directives in place- pt declines Patient reports she has a scale and is now weighing once daily, states [employer] is sending her a new scale, pt states she has lost approximately 15-20 pounds. Patient can benefit from teaching/ reinforcement HF action plan Patient reports she has been to ED several times over past few months for varying reasons (dehydration, heart out of rhythm)  Telephone outreach with RN CM cancelled for today due to patient is in SNF/ [facility] for short term rehab Patient hospitalized 07/10/22 with Atrial Fibrillation, Acute on Chronic CHF  Planned Interventions: Provided education on low sodium diet Provided education about placing scale on hard, flat surface Advised patient to weigh each morning after emptying bladder Discussed importance of daily weight and advised patient to weigh and record daily Reviewed role of diuretics in prevention of fluid overload and management of heart  failure Discussed the importance of keeping all appointments with provider Provided patient with education about the role of exercise in the management of heart failure Reviewed HF action plan, importance of calling doctor early on for change in health status, symptoms Reinforced normal parameters for heart rate and encouraged pt to check heart rate daily Encouraged pt to call primary care provider for change in health status/ symptom management Collaboration with [place] and confirmed patient is resident at [facility] as of 07/19/22 for short term rehab, RN and LCSW will continue to follow and collaborate as needed  Symptom Management: Take medications as prescribed   Attend all scheduled provider appointments Call pharmacy for medication refills 3-7 days in advance of running out of medications Attend church or other social activities Perform all self care activities independently  Perform IADL's (shopping, preparing meals, housekeeping, managing finances) independently Call provider office for new concerns or questions  Work with the social worker to address care coordination needs and will continue to work with the clinical team to address health care and disease management related needs call office if I gain more than 2 pounds in one day or 5 pounds in one week do ankle pumps when sitting keep legs up while sitting track weight in diary use salt in moderation watch for swelling in feet, ankles and legs every day weigh myself daily develop a rescue plan follow rescue plan if symptoms flare-up eat more whole grains, fruits and vegetables, lean meats and healthy fats track symptoms and what helps feel better or worse dress right for  the weather, hot or cold Follow heart failure action plan- call doctor early on for change in health status, symptoms Continue weighing daily and keeping a log- keep up the good work Get outside and walk if you are able Follow up / stay in  touch with your primary care provider's office for symptom management or change in health status Go to ED for unrelieved shortness of breath, if you feel your heart is racing, chest pain  Follow Up Plan: Telephone follow up appointment with care management team member scheduled for:  will continue to follow           Plan:Telephone follow up appointment with care management team member scheduled for:  will continue to follow and collaborate as needed  Jacqlyn Larsen [facility], BSN RN Case Manager [place] [phone]

## 2022-07-20 NOTE — Assessment & Plan Note (Signed)
Current creatinine is 1.11 with a GFR of 53 indicating stage IIIa CKD.  Gabapentin dose will be adjusted if there is progression of CKD.

## 2022-07-20 NOTE — Assessment & Plan Note (Addendum)
Rate control SNF is excellent with range from 50 up to 86.  Continue Eliquis prophylaxis.  Adjust rate controlling meds if there is consistent bradycardia.

## 2022-07-20 NOTE — Patient Instructions (Signed)
Visit Information  Thank you for taking time to visit with me today. Please don't hesitate to contact me if I can be of assistance to you.   Following are the goals we discussed today:   Goals Addressed               This Visit's Progress     Find Help in My Community. (pt-stated)   On track     Care Coordination Interventions:  Interventions Today    Flowsheet Row Most Recent Value  Chronic Disease   Chronic disease during today's visit Hypertension (HTN), Chronic Obstructive Pulmonary Disease (COPD), Other, Chronic Kidney Disease/End Stage Renal Disease (ESRD)  [Parkinson's Disease, Neuropathy, Recurrent Depression & Generalized Anxiety Disorder]  General Interventions   General Interventions Discussed/Reviewed General Interventions Discussed, General Interventions Reviewed, Annual Eye Exam, Labs, Durable Medical Equipment (DME), Vaccines, Health Screening, Community Resources, Doctor Visits, Level of Care  Labs Hgb A1c annually  Vaccines COVID-19, Flu, Pneumonia, RSV, Shingles  Doctor Visits Discussed/Reviewed Doctor Visits Discussed, Doctor Visits Reviewed, Annual Wellness Visits, PCP, Specialist  Health Screening Colonoscopy, Bone Density, Mammogram  Durable Medical Equipment (DME) BP Cuff, Walker, Community education officer  PCP/Specialist Visits Compliance with follow-up visit  Level of Care Applications, Assisted Living, Personal Care Academic librarian, Personal Care Services, Other  [place] Application]  Exercise Interventions   Exercise Discussed/Reviewed Exercise Discussed, Exercise Reviewed, Physical Activity, Assistive device use and maintanence  Physical Activity Discussed/Reviewed Physical Activity Discussed, Physical Activity Reviewed, Types of exercise  Education Interventions   Education Provided Provided Engineer, site, Provided Education, Provided Web-based Education  Provided Verbal Education On Nutrition, Eye  Care, Mental Health/Coping with Illness, Applications, Exercise, Medication, Personal assistant, [employer], When to see the doctor  Applications Medicaid, Personal Care Services, Other  [place] Discussed, Mental Health Reviewed, Coping Strategies, Crisis, Anxiety, Depression  Nutrition Interventions   Nutrition Discussed/Reviewed Nutrition Discussed, Nutrition Reviewed, Fluid intake, Portion sizes, Decreasing sugar intake, Decreasing salt, Decreasing fats, Increaing proteins  Pharmacy Interventions   Pharmacy Dicussed/Reviewed Pharmacy Topics Discussed, Pharmacy Topics Reviewed, Medication Adherence, Affording Medications  Safety Interventions   Safety Discussed/Reviewed Safety Discussed, Safety Reviewed, Fall Risk  Advanced Directive Interventions   Advanced Directives Discussed/Reviewed Advanced Directives Discussed, Advanced Directives Reviewed     Active Listening & Reflection Utilized.  Verbalization of Feelings Encouraged.  Emotional Support Provided. Feelings of Caregiver Burnout Validated. Caregiver Stress Acknowledged. Caregiver Resources Reviewed. Caregiver Support Groups Mailed. Self-Enrollment in Caregiver Support Group of Interest Emphasized. Problem Solving Interventions Enacted. Solution-Focused Strategies Employed. Task-Centered Solutions Implemented. Acceptance & Commitment Therapy Initiated. Cognitive Behavioral Therapy Engaged. Client Centered Therapy Performed. CSW Continued Collaboration with Daughter-in-Law, [name] to Confirm Patient's Admission to [facility] [phone]), for Short-Term Rehabilitative Services, on 07/19/2022. CSW Continued Collaboration with Daughter-in-Law, [name] to Encourage Consideration of Placement for Patient into [place], Upon Completion of  Rehabilitative Services at [facility] [phone]). CSW Continued Collaboration with Daughter-in-Law, [name] to Confirm Receipt & Review List of [place] in [place]. CSW Continued Collaboration with Daughter-in-Law, [name] to [employer] with Toni Arthurs to The Following List of [facility], In An Effort to [employer] for Patient: ~ [employer] [phone]# [phone]) ~ [employer] [phone]) ~ [place] [phone]) ~ [facility] Department  of Social Services (# [phone]) ~ [place] [phone]) ~ [facility] [phone]# [phone]) ~ [employer] [phone]) ~ [facility] (# [phone]) ~ [place] (# [phone]) ~ [place] [phone]) ~ Aging, [place] [facility] [place] [phone]) [place] with Daughter-in-Law, [name] to [employer] with Completion of Application for [employer] & Submission to [employer] [place] [phone]), for Processing. ~ 2023 Medicaid Tips ~ Medicaid Application CSW Continued Collaboration with Daughter-in-Law, [name] to [employer] with Completion of Application for [place] to [employer] [phone]), for Processing. ~ [place] Instructions ~ [place] Application ~ [place] Provider List CSW Continued Collaboration with Daughter-in-Law, [name] to [employer] with Completion of Application for [place] to [facility] Patient Financial Services [phone]), for Processing. ~ [place] Application  CSW Continued Collaboration with Daughter-in-Law, [name] to [employer]  with CSW (# [phone]), if She Has Questions, Needs Assistance with Application Completion & Submission, or If Additional Social Work Needs Are Identified Between Now & Our Next Scheduled Telephone Follow-Up [employer].      Our next appointment is by telephone on 08/04/2022 at 9:15 am.  Please call the care guide team at [phone] if you need to cancel or reschedule your appointment.   If you are experiencing a Mental Health or [place] or need someone to talk to, please call the Suicide and Crisis Lifeline: 988 call the [place] National Suicide Prevention Lifeline: 385-580-1725 or TTY: 364-458-8723 TTY (878)821-4588) to talk to a trained counselor call 1-800-273-TALK (toll free, 24 hour hotline) go to [facility] [address], [place] [phone]) call the [place]: [phone] call 911  Patient verbalizes understanding of instructions and care plan provided today and agrees to view in [place]. Active MyChart status and patient understanding of how to access instructions and care plan via MyChart confirmed with patient.     Telephone follow up appointment with care management team member scheduled for:  08/04/2022 at 9:15 am.  Nat Christen, BSW, MSW, [place]  Licensed Clinical Social Worker  [place]  Mailing [place]. [address], [place], [place] [address] Physical Address-[address] [place], [place] [address] Toll Free Main # [phone] Fax # [phone] Cell # [phone] [email or web address]

## 2022-07-20 NOTE — Patient Instructions (Signed)
See assessment and plan under each diagnosis in the problem list and acutely for this visit 

## 2022-07-20 NOTE — Assessment & Plan Note (Addendum)
CHF clinically compensated; no NVD or HJR. Compression hose controlling peripheral edema. No change in present cardiac regimen indicated.

## 2022-07-21 ENCOUNTER — Encounter: Payer: Self-pay | Admitting: Internal Medicine

## 2022-07-21 DIAGNOSIS — R29818 Other symptoms and signs involving the nervous system: Secondary | ICD-10-CM | POA: Insufficient documentation

## 2022-07-22 ENCOUNTER — Other Ambulatory Visit (HOSPITAL_COMMUNITY)
Admission: RE | Admit: 2022-07-22 | Discharge: 2022-07-22 | Disposition: A | Discharge status: Home / Self Care | Payer: Medicare HMO | Source: Skilled Nursing Facility | Attending: Internal Medicine | Admitting: Internal Medicine

## 2022-07-22 DIAGNOSIS — I480 Paroxysmal atrial fibrillation: Secondary | ICD-10-CM | POA: Insufficient documentation

## 2022-07-22 LAB — TSH: TSH: 1.556 u[IU]/mL (ref 0.350–4.500)

## 2022-07-22 LAB — T4, FREE: Free T4: 1.09 ng/dL (ref 0.61–1.12)

## 2022-07-23 ENCOUNTER — Other Ambulatory Visit (HOSPITAL_COMMUNITY)
Admission: RE | Admit: 2022-07-23 | Discharge: 2022-07-23 | Disposition: A | Discharge status: Home / Self Care | Payer: Medicare HMO | Source: Skilled Nursing Facility | Attending: Adult Health | Admitting: Adult Health

## 2022-07-23 DIAGNOSIS — J449 Chronic obstructive pulmonary disease, unspecified: Secondary | ICD-10-CM | POA: Insufficient documentation

## 2022-07-23 LAB — BASIC METABOLIC PANEL
Anion gap: 10 (ref 5–15)
BUN: 25 mg/dL — ABNORMAL HIGH (ref 8–23)
CO2: 29 mmol/L (ref 22–32)
Calcium: 8.9 mg/dL (ref 8.9–10.3)
Chloride: 95 mmol/L — ABNORMAL LOW (ref 98–111)
Creatinine, Ser: 1.08 mg/dL — ABNORMAL HIGH (ref 0.44–1.00)
GFR, Estimated: 55 mL/min — ABNORMAL LOW (ref >60)
Glucose, Bld: 131 mg/dL — ABNORMAL HIGH (ref 70–99)
Potassium: 3.6 mmol/L (ref 3.5–5.1)
Sodium: 134 mmol/L — ABNORMAL LOW (ref 135–145)

## 2022-07-23 LAB — CBC
HCT: 43.8 % (ref 36.0–46.0)
Hemoglobin: 14.3 g/dL (ref 12.0–15.0)
MCH: 29.9 pg (ref 26.0–34.0)
MCHC: 32.6 g/dL (ref 30.0–36.0)
MCV: 91.4 fL (ref 80.0–100.0)
Platelets: 153 10*3/uL (ref 150–400)
RBC: 4.79 MIL/uL (ref 3.87–5.11)
RDW: 13.2 % (ref 11.5–15.5)
WBC: 5.6 10*3/uL (ref 4.0–10.5)
nRBC: 0 % (ref 0.0–0.2)

## 2022-07-24 LAB — T3, FREE: T3, Free: 2 pg/mL (ref 2.0–4.4)

## 2022-07-25 DIAGNOSIS — I5032 Chronic diastolic (congestive) heart failure: Secondary | ICD-10-CM

## 2022-07-25 DIAGNOSIS — I4891 Unspecified atrial fibrillation: Secondary | ICD-10-CM

## 2022-07-26 ENCOUNTER — Encounter: Payer: Self-pay | Admitting: Adult Health

## 2022-07-26 ENCOUNTER — Non-Acute Institutional Stay (SKILLED_NURSING_FACILITY): Payer: Medicare HMO | Admitting: Adult Health

## 2022-07-26 DIAGNOSIS — J301 Allergic rhinitis due to pollen: Secondary | ICD-10-CM

## 2022-07-26 DIAGNOSIS — J449 Chronic obstructive pulmonary disease, unspecified: Secondary | ICD-10-CM | POA: Diagnosis not present

## 2022-07-26 DIAGNOSIS — J189 Pneumonia, unspecified organism: Secondary | ICD-10-CM

## 2022-07-26 NOTE — Progress Notes (Unsigned)
Location:  [place] Room Number: 132-P Place of Service:  SNF (31)   CODE STATUS: FULL CODE  Allergies  Allergen Reactions   Egg Solids, Whole    Farxiga [Dapagliflozin] Swelling   Penicillins Itching and Rash    Has patient had a PCN reaction causing immediate rash, facial/tongue/throat swelling, SOB or lightheadedness with hypotension: no Has patient had a PCN reaction causing severe rash involving mucus membranes or skin necrosis: no Has patient had a PCN reaction that required hospitalization: no Has patient had a PCN reaction occurring within the last 10 years: no If all of the above answers are "NO", then may proceed with Cephalosporin use.    Sulfa Antibiotics Rash    Chief Complaint  Patient presents with   Acute Visit    Follow up pneumonia.     HPI:    Past Medical History:  Diagnosis Date   Anxiety    Asthma    Chest pain 12/22/2015   CHF (congestive heart failure)    COPD (chronic obstructive pulmonary disease)    Depression    Headache    HTN (hypertension)    Hypokalemia 07/28/2013   Kidney stones    Osteopenia 01/22/2020   Parkinson's disease    Pneumonia    Pre-diabetes    S/P colonoscopy 10/15/04   normal   Vitamin D deficiency 11/24/2018    Past Surgical History:  Procedure Laterality Date   CHOLECYSTECTOMY     COLONOSCOPY  10/15/2004   Normal rectum/Normal colon   COLONOSCOPY N/A 05/10/2016   Procedure: COLONOSCOPY;  Surgeon: Danie Binder, MD;  [facility];  Service: Endoscopy;  Laterality: N/A;  10:30   ESOPHAGEAL MANOMETRY N/A 04/02/2019   Procedure: ESOPHAGEAL MANOMETRY (EM);  Surgeon: Mauri Pole, MD;  Location: [facility];  Service: Endoscopy;  Laterality: N/A;   ESOPHAGOGASTRODUODENOSCOPY  09/11/2010   Dysphagia likely multifactorial (possible Candida esophagitis, likely nonspecific esophageal motility disorder, and/or uncontrolled gastroesophageal reflux disease), status post empiric  dilation   ESOPHAGOGASTRODUODENOSCOPY N/A 11/13/2018   Dr. One[place] Alar: No endoscopic esophageal abnormality to explain patient's dysphagia.  Esophagus dilated.  Moderate erosive/nodular gastritis with benign biopsies.   KIDNEY STONE SURGERY     SAVORY DILATION N/A 11/13/2018   Procedure: SAVORY DILATION;  Surgeon: Danie Binder, MD;  [facility];  Service: Endoscopy;  Laterality: N/A;    Social History   Socioeconomic History   Marital status: Widowed    Spouse name: Not on file   Number of children: 2   Years of education: 50   Highest education level: 12th grade  Occupational History   Occupation: disabled  Tobacco Use   Smoking status: Former    Packs/day: 0.25    Years: 30.00    Additional pack years: 0.00    Total pack years: 7.50    Types: Cigarettes    Start date: 01/04/1965    Quit date: 01/03/1995    Years since quitting: 27.5    Passive exposure: Past   Smokeless tobacco: Never  Vaping Use   Vaping Use: Never used  Substance and Sexual Activity   Alcohol use: No    Alcohol/week: 0.0 standard drinks of alcohol   Drug use: No   Sexual activity: Not Currently    Partners: Male  Other Topics Concern   Not on file  Social History Narrative   Lives alone. No stairs. Has ramps. Handicap bathroom   Social Determinants of Radio broadcast assistant  Strain: Medium Risk (04/16/2022)   Overall Financial Resource Strain (CARDIA)    Difficulty of Paying Living Expenses: Somewhat hard  Food Insecurity: No Food Insecurity (04/16/2022)   Hunger Vital Sign    Worried About Running Out of Food in the Last Year: Never true    [place] in the Last Year: Never true  Recent Concern: [place] Present (02/24/2022)   Hunger Vital Sign    Worried About Running Out of Food in the Last Year: Often true    Ran Out of Food in the Last Year: Often true  Transportation Needs: No Transportation Needs (04/16/2022)   PRAPARE - Armed forces logistics/support/administrative officer (Medical): No    Lack of Transportation (Non-Medical): No  Physical Activity: Insufficiently Active (04/16/2022)   Exercise Vital Sign    Days of Exercise per Week: 3 days    Minutes of Exercise per Session: 30 min  Stress: No Stress Concern Present (04/16/2022)   [place]    Feeling of Stress : Not at all  Social Connections: Moderately Integrated (04/16/2022)   Social Connection and Isolation Panel [NHANES]    Frequency of Communication with Friends and Family: More than three times a week    Frequency of Social Gatherings with Friends and Family: More than three times a week    Attends Religious Services: More than 4 times per year    Active Member of [employer] or Organizations: Yes    Attends Archivist Meetings: More than 4 times per year    Marital Status: Widowed  Recent Concern: Social Connections - Socially Isolated (03/24/2022)   Social Connection and Isolation Panel [NHANES]    Frequency of Communication with Friends and Family: More than three times a week    Frequency of Social Gatherings with Friends and Family: More than three times a week    Attends Religious Services: Never    Marine scientist or Organizations: No    Attends Archivist Meetings: Never    Marital Status: Widowed  Intimate Partner Violence: Not At Risk (04/16/2022)   Humiliation, Afraid, Rape, and Kick questionnaire    Fear of Current or Ex-Partner: No    Emotionally Abused: No    Physically Abused: No    Sexually Abused: No   Family History  Problem Relation Age of Onset   Coronary artery disease Father    COPD Mother    Asthma Brother    Coronary artery disease Sister    Diabetes Sister    Coronary artery disease Brother    Coronary artery disease Sister       VITAL SIGNS BP 100/70   Pulse 82   Temp 97.6 F (36.4 C)   Resp 20   Ht 5\' 6"  (1.676 m)   Wt 208 lb 6.4 oz (94.5  kg)   SpO2 92%   BMI 33.64 kg/m   Outpatient Encounter Medications as of 07/26/2022  Medication Sig   acetaminophen (TYLENOL) 500 MG tablet Take 1 tablet (500 mg total) by mouth every 6 (six) hours as needed.   albuterol (PROVENTIL) (2.5 MG/3ML) 0.083% nebulizer solution Take 3 mLs (2.5 mg total) by nebulization every 6 (six) hours as needed for wheezing or shortness of breath.   albuterol (VENTOLIN HFA) 108 (90 Base) MCG/ACT inhaler Inhale 2 puffs into the lungs every 6 (six) hours as needed for wheezing or shortness of breath.  ALLERGY RELIEF CETIRIZINE 10 MG tablet Take 1 tablet by mouth every day   alum & mag hydroxide-simeth (MAALOX/MYLANTA) 200-200-20 MG/5ML suspension Take 30 mLs by mouth every 6 (six) hours as needed for indigestion or heartburn.   amiodarone (PACERONE) 200 MG tablet Take 1 tablet (200 mg total) by mouth daily.   apixaban (ELIQUIS) 5 MG TABS tablet Take 1 tablet (5 mg total) by mouth 2 (two) times daily.   Ascorbic Acid (VITAMIN C PO) Take 1 tablet by mouth 3 (three) times daily.   atorvastatin (LIPITOR) 40 MG tablet Take 1 tablet by mouth every day   CALCIUM PO Take 1 tablet by mouth daily.   cholecalciferol (VITAMIN D3) 25 MCG (1000 UT) tablet Take 1,000 Units by mouth daily.   doxycycline (ADOXA) 100 MG tablet Take 100 mg by mouth 2 (two) times daily.   fluticasone (FLONASE) 50 MCG/ACT nasal spray Use 2 sprays into each nostril every day   furosemide (LASIX) 40 MG tablet Take 1 tablet (40 mg total) by mouth daily.   metoprolol succinate (TOPROL-XL) 50 MG 24 hr tablet Take 1 tablet (50 mg total) by mouth daily. Take with or immediately following a meal.   nitroGLYCERIN (NITROSTAT) 0.4 MG SL tablet Place 1 tablet (0.4 mg total) under the tongue every 5 (five) minutes as needed for chest pain.   pantoprazole (PROTONIX) 40 MG tablet Take 1 tablet by mouth twice daily   pramipexole (MIRAPEX) 0.5 MG tablet Take 1 tablet by mouth at bedtime   sertraline (ZOLOFT) 100 MG  tablet Take 2 tablets (200 mg total) by mouth daily.   amiodarone (PACERONE) 200 MG tablet Take 1 tablet (200 mg total) by mouth 2 (two) times daily for 6 days.   glucose blood (ACCU-CHEK AVIVA PLUS) test strip Test sugars daily   [DISCONTINUED] Alcohol Swabs (B-D SINGLE USE SWABS REGULAR) PADS Test sugars daily   [DISCONTINUED] gabapentin (NEURONTIN) 300 MG capsule Take 2 capsules by mouth 3 times a day   [DISCONTINUED] Potassium 99 MG TABS Take 1 tablet by mouth daily.   No facility-administered encounter medications on file as of 07/26/2022.     SIGNIFICANT DIAGNOSTIC EXAMS  TODAY  07-23-22: chest x-ray:  Bilateral perihilar fullness: nonspecific in nature Cardiomegaly Consider risk factors for COPD  LABS REVIEWED:   02-24-22: chol 158; ldl 85; trig 203; hdl 39 07-22-22: tsh 1.556 free t3: 2.0 free t4: 1.09 07-23-22: wbc 5.6; hgb 14.3; hct 43.8; mcv 91.4 plt 153; glucose 131; bun 25; creat 1.08; k+ 3.6; na++ 134; ca 8.9; gfr 55   Review of Systems  Constitutional:  Negative for malaise/fatigue.  Respiratory:  Positive for cough and shortness of breath.   Cardiovascular:  Negative for chest pain, palpitations and leg swelling.  Gastrointestinal:  Negative for abdominal pain, constipation and heartburn.  Musculoskeletal:  Negative for back pain, joint pain and myalgias.  Skin: Negative.   Neurological:  Negative for dizziness.  Psychiatric/Behavioral:  The patient is not nervous/anxious.    Physical Exam Constitutional:      General: She is not in acute distress.    Appearance: She is well-developed. She is obese. She is not diaphoretic.  Neck:     Thyroid: No thyromegaly.  Cardiovascular:     Rate and Rhythm: Normal rate and regular rhythm.     Pulses: Normal pulses.     Heart sounds: Normal heart sounds.  Pulmonary:     Effort: Pulmonary effort is normal. No respiratory distress.     Breath  sounds: Rhonchi present.  Abdominal:     General: Bowel sounds are normal. There  is no distension.     Palpations: Abdomen is soft.     Tenderness: There is no abdominal tenderness.  Musculoskeletal:        General: Normal range of motion.     Cervical back: Neck supple.     Right lower leg: No edema.     Left lower leg: No edema.  Lymphadenopathy:     Cervical: No cervical adenopathy.  Skin:    General: Skin is warm and dry.  Neurological:     Mental Status: She is alert. Mental status is at baseline.  Psychiatric:        Mood and Affect: Mood normal.       ASSESSMENT/ PLAN:  TODAY  Chronic pulmonary obstructive disease unspecified COPD type Seasonal allergic rhinitis due to pollen HCAP   Will complete doxycycline until complete Will continue to monitor her status      Ok Edwards NP [facility]  call [phone]

## 2022-07-30 ENCOUNTER — Ambulatory Visit: Payer: Medicare HMO | Admitting: Pharmacist

## 2022-07-30 ENCOUNTER — Telehealth: Payer: Self-pay | Admitting: Pharmacist

## 2022-07-30 NOTE — Telephone Encounter (Signed)
    07/30/2022 Name: Allison Rollins MRN: [id number] DOB: 05-01-1950   Unsuccessful outreach to patient today Re: medication management  assistance.  Message sent to [facility] RN and CMA for support to reschedule and advise.  It appears patient is currently in SNF.  Will plan to follow up as patient transitions home.    Kieth Brightly, PharmD, BCACP Clinical Pharmacist, [facility]

## 2022-08-01 ENCOUNTER — Other Ambulatory Visit: Payer: Self-pay | Admitting: Family Medicine

## 2022-08-01 DIAGNOSIS — K2100 Gastro-esophageal reflux disease with esophagitis, without bleeding: Secondary | ICD-10-CM

## 2022-08-01 DIAGNOSIS — E782 Mixed hyperlipidemia: Secondary | ICD-10-CM

## 2022-08-02 ENCOUNTER — Other Ambulatory Visit: Payer: Self-pay | Admitting: *Deleted

## 2022-08-02 DIAGNOSIS — R531 Weakness: Secondary | ICD-10-CM | POA: Diagnosis not present

## 2022-08-02 DIAGNOSIS — M6281 Muscle weakness (generalized): Secondary | ICD-10-CM | POA: Diagnosis not present

## 2022-08-02 DIAGNOSIS — R262 Difficulty in walking, not elsewhere classified: Secondary | ICD-10-CM | POA: Diagnosis not present

## 2022-08-02 NOTE — Patient Outreach (Addendum)
Verified in [facility] Allison Rollins discharged from [facility] on 08/02/22. Allison Rollins has been active with [facility] care coordination team as benefit of health plan and PCP.   Message sent to SNF social worker to inquire about home health agency arrangements.   Message sent to [facility] care coordination team to make aware of SNF discharge.   AddendumMelton Alar social worker reports [facility] home health was arranged.   [name], MSN, RN,BSN [facility] Post Acute Care Coordinator [phone] (Direct dial)

## 2022-08-03 ENCOUNTER — Encounter: Payer: Self-pay | Admitting: *Deleted

## 2022-08-03 ENCOUNTER — Ambulatory Visit (INDEPENDENT_AMBULATORY_CARE_PROVIDER_SITE_OTHER): Payer: Medicare HMO | Admitting: *Deleted

## 2022-08-03 DIAGNOSIS — I10 Essential (primary) hypertension: Secondary | ICD-10-CM

## 2022-08-03 DIAGNOSIS — I5032 Chronic diastolic (congestive) heart failure: Secondary | ICD-10-CM

## 2022-08-03 NOTE — Patient Instructions (Addendum)
Please call the care guide team at [phone] if you need to cancel or reschedule your appointment.   If you are experiencing a Mental Health or Behavioral Health Crisis or need someone to talk to, please call the Suicide and Crisis Lifeline: 988 call the [place] National Suicide Prevention Lifeline: 7816042069 or TTY: 308 078 8663 TTY 207-125-3955) to talk to a trained counselor call 1-800-273-TALK (toll free, 24 hour hotline) go to [facility] [address] [place] [phone]) call the [facility]: [phone] call 911   Following is a copy of the CCM Program Consent:  CCM service includes personalized support from designated clinical staff supervised by the physician, including individualized plan of care and coordination with other care providers 24/7 contact phone numbers for assistance for urgent and routine care needs. Service will only be billed when office clinical staff spend 20 minutes or more in a month to coordinate care. Only one practitioner may furnish and bill the service in a calendar month. The patient may stop CCM services at amy time (effective at the end of the month) by phone call to the office staff. The patient will be responsible for cost sharing (co-pay) or up to 20% of the service fee (after annual deductible is met)  Following is a copy of your full provider care plan:   Goals Addressed             This Visit's Progress    CCM (CONGESTIVE HEART FAILURE) EXPECTED OUTCOME: MONITOR, SELF-MANAGE AND REDUCE SYMPTOMS OF CONGESTIVE HEART FAILURE       Current Barriers:  Knowledge Deficits related to Congestive Heart Failure Care Coordination needs related to depression in a patient with Congestive Heart Failure Chronic Disease Management support and education needs related to management of Congestive Heart Failure and diet No Advanced Directives in place- pt declines Patient reports she has a scale  and is now weighing once daily, states [employer] is sending her a new scale, pt states she has lost approximately 15-20 pounds. Patient can benefit from teaching/ reinforcement HF action plan Patient reports she has been to ED several times over past few months for varying reasons (dehydration, heart out of rhythm)  Patient hospitalized 07/10/22 with Atrial Fibrillation, Acute on Chronic CHF, admitted to SNF [facility]) on 07/19/22-08/02/22 Spoke with patient who reports she is feeling better, has assistance from her son and a friend in the community Patient verbalizes understanding to weigh daily and states she will start tomorrow  Planned Interventions: Provided education on low sodium diet Provided education about placing scale on hard, flat surface Advised patient to weigh each morning after emptying bladder Discussed importance of daily weight and advised patient to weigh and record daily Reviewed role of diuretics in prevention of fluid overload and management of heart failure Discussed the importance of keeping all appointments with provider Provided patient with education about the role of exercise in the management of heart failure Reinforced HF action plan, importance of calling doctor early on for change in health status, symptoms Reinforced normal parameters for heart rate and encouraged pt to check heart rate daily Encouraged pt to call primary care provider for change in health status/ symptom management Transition of care completed Medications reviewed Reviewed all upcoming scheduled appointments Functional Questionnaire completed [facility] contacted for clairification (pt states home health called and will be seeing her tomorrow- does not know name of agency)  Symptom Management: Take medications as prescribed   Attend all scheduled provider  appointments Call pharmacy for medication refills 3-7 days in advance of running out of medications Attend church or  other social activities Perform all self care activities independently  Perform IADL's (shopping, preparing meals, housekeeping, managing finances) independently Call provider office for new concerns or questions  Work with the social worker to address care coordination needs and will continue to work with the clinical team to address health care and disease management related needs call office if I gain more than 2 pounds in one day or 5 pounds in one week do ankle pumps when sitting keep legs up while sitting track weight in diary use salt in moderation watch for swelling in feet, ankles and legs every day weigh myself daily develop a rescue plan follow rescue plan if symptoms flare-up eat more whole grains, fruits and vegetables, lean meats and healthy fats track symptoms and what helps feel better or worse dress right for the weather, hot or cold Follow heart failure action plan- call doctor early on for change in health status, symptoms Continue weighing daily and keeping a log- keep up the good work Follow up / stay in touch with your primary care provider's office for symptom management or change in health status Go to ED for unrelieved shortness of breath, if you feel your heart is racing, chest pain Call RN care manager at [phone] if home health does not visit on 08/04/22 as scheduled Follow up with primary care provider on 08/09/22 at 1104 am Follow up with Dr. Wyline Mood on 08/12/22 at 1020 am  Follow Up Plan: Telephone follow up appointment with care management team member scheduled for:  08/23/22 at 3 pm          The patient verbalized understanding of instructions, educational materials, and care plan provided today and DECLINED offer to receive copy of patient instructions, educational materials, and care plan.   Telephone follow up appointment with care management team member scheduled for:  08/23/22 at 3 pm

## 2022-08-03 NOTE — Chronic Care Management (AMB) (Cosign Needed Addendum)
   08/03/2022  Allison Rollins [birth year] [id number]   Allison Rollins was discharged yesterday days ago after  SNF inpatient stay  at  [facility] . A Transition of Care Call was completed today.    Care Plan updates include:  Transition of care completed Medications reviewed Reviewed all upcoming scheduled appointments Functional Questionnaire completed [facility] contacted for clairification (pt states home health called and will be seeing her tomorrow- does not know name of agency)  Please see Transitions of Care (Post Inpatient/ED Visit) note for details.   Irving Shows RNC, BSN RN Case Manager [facility] [phone]

## 2022-08-04 ENCOUNTER — Encounter: Payer: Self-pay | Admitting: *Deleted

## 2022-08-04 ENCOUNTER — Ambulatory Visit: Payer: Self-pay | Admitting: *Deleted

## 2022-08-04 DIAGNOSIS — F339 Major depressive disorder, recurrent, unspecified: Secondary | ICD-10-CM | POA: Diagnosis not present

## 2022-08-04 DIAGNOSIS — J301 Allergic rhinitis due to pollen: Secondary | ICD-10-CM | POA: Diagnosis not present

## 2022-08-04 DIAGNOSIS — R7303 Prediabetes: Secondary | ICD-10-CM | POA: Diagnosis not present

## 2022-08-04 DIAGNOSIS — I5032 Chronic diastolic (congestive) heart failure: Secondary | ICD-10-CM | POA: Diagnosis not present

## 2022-08-04 DIAGNOSIS — I130 Hypertensive heart and chronic kidney disease with heart failure and stage 1 through stage 4 chronic kidney disease, or unspecified chronic kidney disease: Secondary | ICD-10-CM | POA: Diagnosis not present

## 2022-08-04 DIAGNOSIS — I495 Sick sinus syndrome: Secondary | ICD-10-CM | POA: Diagnosis not present

## 2022-08-04 DIAGNOSIS — N1831 Chronic kidney disease, stage 3a: Secondary | ICD-10-CM | POA: Diagnosis not present

## 2022-08-04 DIAGNOSIS — I480 Paroxysmal atrial fibrillation: Secondary | ICD-10-CM | POA: Diagnosis not present

## 2022-08-04 DIAGNOSIS — J440 Chronic obstructive pulmonary disease with acute lower respiratory infection: Secondary | ICD-10-CM | POA: Diagnosis not present

## 2022-08-04 DIAGNOSIS — Z8701 Personal history of pneumonia (recurrent): Secondary | ICD-10-CM | POA: Diagnosis not present

## 2022-08-04 DIAGNOSIS — R2681 Unsteadiness on feet: Secondary | ICD-10-CM | POA: Diagnosis not present

## 2022-08-04 DIAGNOSIS — E559 Vitamin D deficiency, unspecified: Secondary | ICD-10-CM | POA: Diagnosis not present

## 2022-08-04 DIAGNOSIS — G2581 Restless legs syndrome: Secondary | ICD-10-CM | POA: Diagnosis not present

## 2022-08-04 DIAGNOSIS — Z87891 Personal history of nicotine dependence: Secondary | ICD-10-CM | POA: Diagnosis not present

## 2022-08-04 DIAGNOSIS — F411 Generalized anxiety disorder: Secondary | ICD-10-CM | POA: Diagnosis not present

## 2022-08-04 DIAGNOSIS — I5021 Acute systolic (congestive) heart failure: Secondary | ICD-10-CM | POA: Diagnosis not present

## 2022-08-04 DIAGNOSIS — J189 Pneumonia, unspecified organism: Secondary | ICD-10-CM | POA: Diagnosis not present

## 2022-08-04 DIAGNOSIS — G20A1 Parkinson's disease without dyskinesia, without mention of fluctuations: Secondary | ICD-10-CM | POA: Diagnosis not present

## 2022-08-04 DIAGNOSIS — F0150 Vascular dementia without behavioral disturbance: Secondary | ICD-10-CM | POA: Diagnosis not present

## 2022-08-04 DIAGNOSIS — M8580 Other specified disorders of bone density and structure, unspecified site: Secondary | ICD-10-CM | POA: Diagnosis not present

## 2022-08-04 DIAGNOSIS — R498 Other voice and resonance disorders: Secondary | ICD-10-CM | POA: Diagnosis not present

## 2022-08-04 NOTE — Patient Instructions (Signed)
Visit Information  Thank you for taking time to visit with me today. Please don't hesitate to contact me if I can be of assistance to you.   Following are the goals we discussed today:   Goals Addressed               This Visit's Progress     Find Help in My Community. (pt-stated)   On track     Care Coordination Interventions:  Interventions Today    Flowsheet Row Most Recent Value  Chronic Disease   Chronic disease during today's visit Hypertension (HTN), Chronic Obstructive Pulmonary Disease (COPD), Other, Chronic Kidney Disease/End Stage Renal Disease (ESRD)  [Parkinson's Disease, Neuropathy, Recurrent Depression & Generalized Anxiety Disorder]  General Interventions   General Interventions Discussed/Reviewed General Interventions Discussed, General Interventions Reviewed, Annual Eye Exam, Labs, Durable Medical Equipment (DME), Vaccines, Health Screening, Community Resources, Doctor Visits, Level of Care  Labs Hgb A1c annually  Vaccines COVID-19, Flu, Pneumonia, RSV, Shingles  Doctor Visits Discussed/Reviewed Doctor Visits Discussed, Doctor Visits Reviewed, Annual Wellness Visits, PCP, Specialist  Health Screening Colonoscopy, Bone Density, Mammogram  Durable Medical Equipment (DME) BP Cuff, Walker, Psychologist, forensic  PCP/Specialist Visits Compliance with follow-up visit  Level of Care Applications, Assisted Living, Personal Care Counsellor, Personal Care Services, Other  [facility] Financial Assistance Application]  Exercise Interventions   Exercise Discussed/Reviewed Exercise Discussed, Exercise Reviewed, Physical Activity, Assistive device use and maintanence  Physical Activity Discussed/Reviewed Physical Activity Discussed, Physical Activity Reviewed, Types of exercise  Education Interventions   Education Provided Provided Therapist, sports, Provided Education, Provided Web-based Education  Provided Verbal Education On Nutrition, Eye  Care, Mental Health/Coping with Illness, Applications, Exercise, Medication, Development worker, community, [employer], When to see the doctor  Applications Medicaid, Personal Care Services, Other  [employer] Health Financial Assistance Application]  Mental Health Interventions   Mental Health Discussed/Reviewed Mental Health Discussed, Mental Health Reviewed, Coping Strategies, Crisis, Anxiety, Depression  Nutrition Interventions   Nutrition Discussed/Reviewed Nutrition Discussed, Nutrition Reviewed, Fluid intake, Portion sizes, Decreasing sugar intake, Decreasing salt, Decreasing fats, Increaing proteins  Pharmacy Interventions   Pharmacy Dicussed/Reviewed Pharmacy Topics Discussed, Pharmacy Topics Reviewed, Medication Adherence, Affording Medications  Safety Interventions   Safety Discussed/Reviewed Safety Discussed, Safety Reviewed, Fall Risk  Advanced Directive Interventions   Advanced Directives Discussed/Reviewed Advanced Directives Discussed, Advanced Directives Reviewed     Active Listening & Reflection Utilized.  Verbalization of Feelings Encouraged.  Emotional Support Provided. Feelings of Caregiver Burnout Validated. Caregiver Stress Acknowledged. Caregiver Resources Reviewed. Caregiver Support Groups Mailed. Self-Enrollment in Caregiver Support Group of Interest Emphasized. Problem Solving Interventions Enacted. Solution-Focused Strategies Employed. Task-Centered Solutions Implemented. Acceptance & Commitment Therapy Initiated. Cognitive Behavioral Therapy Engaged. Client Centered Therapy Performed. CSW Collaboration with Daughter-in-Law, [name] to Confirm Discharge from [facility] [phone]), Where Patient was Residing to Receive Short-Term Rehabilitative Services, on 08/02/2022, Returning Home to Live Alone. CSW Collaboration with Daughter-in-Law, [name] to Confirm Patient's Disinterest in Receiving Placement into A Long-Term Memory Care  Assisted Living Facility. CSW Collaboration with Daughter-in-Law, [name] to Encourage Assistance in Helping Patient Outreach to The Following List of [employer], [employer], In An Effort to [employer] Assistance: ~ [employer] [phone]# [phone]) ~ [employer] 484-157-1222) ~ Lot ([phone] (# 680-216-9436) ~ [facility] Department of Social Services (867)734-2809# (239) [phone]) ~ [employer] of [place] (678)871-3481) ~ [employer] 843-140-7602# 442-233-4124) ~ [employer] (765)328-0598) ~ [facility] [place] (#  916-697-3611) ~ [employer] (# (818)399-5614) ~ [place] [employer] 626-101-6707) ~ Aging, Disability & Transit Services of [place] 630-657-4723) CSW Collaboration with Daughter-in-Law, [name] to Encourage Assistance in Helping Patient Complete Application for Medicaid & Submit to [facility] (# [phone]), for Processing. ~ 2023 Medicaid Tips ~ Medicaid Application CSW Continued Collaboration with Daughter-in-Law, [name] to [employer] with Completion of Application for Personal Care Services & Submission to [employer] [phone]), for Processing. ~ Personal Care Services Instructions ~ Personal Care Services Application ~ Personal Care Services Agency Provider List CSW Collaboration with Daughter-in-Law, [name] to [facility] Skilled Nursing, Physical Therapy & [place] Aide Services Have Been Arranged through [facility] [phone]) & Services Scheduled to Begin on 08/04/2022. CSW Collaboration with Daughter-in-Law, [name] to Encourage Attendance with Patient at Follow-Up Appointment with Primary Care Provider, Family Nurse Practitioner, Harlow Mares with [facility] [phone]), Scheduled on 08/09/2022 at 11:05 AM.  CSW Collaboration with Daughter-in-Law, [name] to Encourage Attendance with  Patient at Follow-Up Appointment with Dr. Dina Rich, Cardiologist with [facility] [phone]), Scheduled on 08/12/2022 at 10:20 AM. CSW Collaboration with Daughter-in-Law, [name] to [employer] with CSW (# [phone]), if She Has Questions, Needs Assistance with Application Completion & Submission, or If Additional Social Work Needs Are Identified Between Now & Our Next Scheduled Telephone Follow-Up Outreach Call.      Our next appointment is by telephone on 08/17/2022 at 11:00 am.  Please call the care guide team at [phone] if you need to cancel or reschedule your appointment.   If you are experiencing a Mental Health or Behavioral Health Crisis or need someone to talk to, please call the Suicide and Crisis Lifeline: 988 call the [place] National Suicide Prevention Lifeline: (920) 287-1468 or TTY: 820-841-6456 TTY 606-375-8418) to talk to a trained counselor call 1-800-273-TALK (toll free, 24 hour hotline) go to [facility] [address] [place] [phone]) call the [facility] Crisis Line: [phone] call 911  Patient verbalizes understanding of instructions and care plan provided today and agrees to view in MyChart. Active MyChart status and patient understanding of how to access instructions and care plan via MyChart confirmed with patient.     Telephone follow up appointment with care management team member scheduled for:  08/17/2022 at 11:00 am.  Danford Bad, BSW, MSW, LCSW  Licensed Clinical Social Worker  [facility]  Mailing [place]. [address] [place], [place] [address] Physical Address[address] [place], [place] [address] Toll Free Main # [phone] Fax # [phone] Cell # [phone] [email or web address]

## 2022-08-04 NOTE — Patient Outreach (Signed)
Care Coordination   Follow Up Visit Note   08/04/2022  Name: Allison Rollins MRN: [id number] DOB: 1950-06-16  Allison Rollins is a 72 y.o. year old female who sees Gabriel Earing, FNP for primary care. I spoke with Allison Rollins and daughter-in-law, [name] by phone today.  What matters to the patients health and wellness today?  Find Help in My Community.    Goals Addressed               This Visit's Progress     Find Help in My Community. (pt-stated)   On track     Care Coordination Interventions:  Interventions Today    Flowsheet Row Most Recent Value  Chronic Disease   Chronic disease during today's visit Hypertension (HTN), Chronic Obstructive Pulmonary Disease (COPD), Other, Chronic Kidney Disease/End Stage Renal Disease (ESRD)  [Parkinson's Disease, Neuropathy, Recurrent Depression & Generalized Anxiety Disorder]  General Interventions   General Interventions Discussed/Reviewed General Interventions Discussed, General Interventions Reviewed, Annual Eye Exam, Labs, Durable Medical Equipment (DME), Vaccines, Health Screening, Community Resources, Doctor Visits, Level of Care  Labs Hgb A1c annually  Vaccines COVID-19, Flu, Pneumonia, RSV, Shingles  Doctor Visits Discussed/Reviewed Doctor Visits Discussed, Doctor Visits Reviewed, Annual Wellness Visits, PCP, Specialist  Health Screening Colonoscopy, Bone Density, Mammogram  Durable Medical Equipment (DME) BP Cuff, Walker, Psychologist, forensic  PCP/Specialist Visits Compliance with follow-up visit  Level of Care Applications, Assisted Living, Personal Care Counsellor, Personal Care Services, Other  [facility] Financial Assistance Application]  Exercise Interventions   Exercise Discussed/Reviewed Exercise Discussed, Exercise Reviewed, Physical Activity, Assistive device use and maintanence  Physical Activity Discussed/Reviewed Physical Activity Discussed, Physical Activity Reviewed,  Types of exercise  Education Interventions   Education Provided Provided Therapist, sports, Provided Education, Provided Web-based Education  Provided Verbal Education On Nutrition, Eye Care, Mental Health/Coping with Illness, Applications, Exercise, Medication, Development worker, community, [employer], When to see the doctor  Applications Medicaid, Personal Care Services, Other  [employer] Health Financial Assistance Application]  Mental Health Interventions   Mental Health Discussed/Reviewed Mental Health Discussed, Mental Health Reviewed, Coping Strategies, Crisis, Anxiety, Depression  Nutrition Interventions   Nutrition Discussed/Reviewed Nutrition Discussed, Nutrition Reviewed, Fluid intake, Portion sizes, Decreasing sugar intake, Decreasing salt, Decreasing fats, Increaing proteins  Pharmacy Interventions   Pharmacy Dicussed/Reviewed Pharmacy Topics Discussed, Pharmacy Topics Reviewed, Medication Adherence, Affording Medications  Safety Interventions   Safety Discussed/Reviewed Safety Discussed, Safety Reviewed, Fall Risk  Advanced Directive Interventions   Advanced Directives Discussed/Reviewed Advanced Directives Discussed, Advanced Directives Reviewed     Active Listening & Reflection Utilized.  Verbalization of Feelings Encouraged.  Emotional Support Provided. Feelings of Caregiver Burnout Validated. Caregiver Stress Acknowledged. Caregiver Resources Reviewed. Caregiver Support Groups Mailed. Self-Enrollment in Caregiver Support Group of Interest Emphasized. Problem Solving Interventions Enacted. Solution-Focused Strategies Employed. Task-Centered Solutions Implemented. Acceptance & Commitment Therapy Initiated. Cognitive Behavioral Therapy Engaged. Client Centered Therapy Performed. CSW Collaboration with Daughter-in-Law, [name] to Confirm Discharge from [facility] [phone]), Where Patient was Residing to Receive Short-Term Rehabilitative Services,  on 08/02/2022, Returning Home to Live Alone. CSW Collaboration with Daughter-in-Law, [name] to Confirm Patient's Disinterest in Receiving Placement into A Long-Term Memory Care Assisted Living Facility. CSW Collaboration with Daughter-in-Law, [name] to [employer] in Helping Patient Outreach to The Following List of [employer], [employer], In An Effort to [employer] Assistance: ~ [employer] (# 647-756-1033) ~ [employer] (720)670-2413) ~ Lot  743-340-9381) ~ [facility] Department of Social Services [phone]# [phone]) ~ [employer] of [place] [phone]) ~ [employer] [phone]# [phone]) ~ [employer] [phone]) ~ [facility] of [place] (# (702)[phone]) ~ [employer] (# 204-019-4252) ~ [employer] [employer] (302)[phone]) ~ Aging, Disability & Transit Services of [place] [phone]) CSW Collaboration with Daughter-in-Law, [name] to Encourage Assistance in Helping Patient Complete Application for Medicaid & Submit to [facility] (# [phone]), for Processing. ~ 2023 Medicaid Tips ~ Medicaid Application CSW Continued Collaboration with Daughter-in-Law, [name] to [employer] with Completion of Application for Personal Care Services & Submission to [employer] [phone]), for Processing. ~ Personal Care Services Instructions ~ Personal Care Services Application ~ Personal Care Services Agency Provider List CSW Collaboration with Daughter-in-Law, [name] to [facility] Skilled Nursing, Physical Therapy & [place] Aide Services Have Been Arranged through [facility] [phone]) & Services Scheduled to Begin on 08/04/2022. CSW Collaboration with Daughter-in-Law, [name] to Encourage Attendance with Patient at Follow-Up Appointment with Primary Care Provider, Family Nurse Practitioner, Harlow Mares with [facility] [phone]), Scheduled on 08/09/2022 at 11:05 AM.  CSW Collaboration with Daughter-in-Law, [name] to Encourage Attendance with Patient at Follow-Up Appointment with Dr. Dina Rich, Cardiologist with [facility] [phone]), Scheduled on 08/12/2022 at 10:20 AM. CSW Collaboration with Daughter-in-Law, [name] to [employer] with CSW (# [phone]), if She Has Questions, Needs Assistance with Application Completion & Submission, or If Additional Social Work Needs Are Identified Between Now & Our Next Scheduled Telephone Follow-Up Outreach Call.      SDOH assessments and interventions completed:  Yes.  Care Coordination Interventions:  Yes, provided.   Follow up plan: Follow up call scheduled for 08/17/2022 at 11:00 am.  Encounter Outcome:  Pt. Visit Completed.   Danford Bad, BSW, MSW, LCSW  Licensed Restaurant manager, fast food [facility]  Mailing [place] [address] [place], [place] [address] Physical Address-[address] [place], [place] [address] Toll Free Main # [phone] Fax # [phone] Cell # [phone] [email or web address]

## 2022-08-09 ENCOUNTER — Ambulatory Visit: Payer: Medicare HMO | Admitting: Nurse Practitioner

## 2022-08-10 ENCOUNTER — Emergency Department (HOSPITAL_COMMUNITY): Payer: Medicare HMO

## 2022-08-10 ENCOUNTER — Encounter (HOSPITAL_COMMUNITY): Payer: Self-pay

## 2022-08-10 ENCOUNTER — Other Ambulatory Visit: Payer: Self-pay

## 2022-08-10 ENCOUNTER — Inpatient Hospital Stay (HOSPITAL_COMMUNITY)
Admission: EM | Admit: 2022-08-10 | Discharge: 2022-08-17 | DRG: 312 | Disposition: A | Discharge status: Home Health | Payer: Medicare HMO | Attending: Internal Medicine | Admitting: Internal Medicine

## 2022-08-10 DIAGNOSIS — I4821 Permanent atrial fibrillation: Secondary | ICD-10-CM | POA: Diagnosis not present

## 2022-08-10 DIAGNOSIS — G909 Disorder of the autonomic nervous system, unspecified: Secondary | ICD-10-CM | POA: Diagnosis present

## 2022-08-10 DIAGNOSIS — Z79899 Other long term (current) drug therapy: Secondary | ICD-10-CM

## 2022-08-10 DIAGNOSIS — Z880 Allergy status to penicillin: Secondary | ICD-10-CM | POA: Diagnosis not present

## 2022-08-10 DIAGNOSIS — I10 Essential (primary) hypertension: Secondary | ICD-10-CM | POA: Diagnosis not present

## 2022-08-10 DIAGNOSIS — Z9981 Dependence on supplemental oxygen: Secondary | ICD-10-CM | POA: Diagnosis not present

## 2022-08-10 DIAGNOSIS — Z888 Allergy status to other drugs, medicaments and biological substances status: Secondary | ICD-10-CM

## 2022-08-10 DIAGNOSIS — Z882 Allergy status to sulfonamides status: Secondary | ICD-10-CM

## 2022-08-10 DIAGNOSIS — J4489 Other specified chronic obstructive pulmonary disease: Secondary | ICD-10-CM | POA: Diagnosis present

## 2022-08-10 DIAGNOSIS — Z8249 Family history of ischemic heart disease and other diseases of the circulatory system: Secondary | ICD-10-CM

## 2022-08-10 DIAGNOSIS — I5022 Chronic systolic (congestive) heart failure: Secondary | ICD-10-CM | POA: Diagnosis not present

## 2022-08-10 DIAGNOSIS — Z87891 Personal history of nicotine dependence: Secondary | ICD-10-CM

## 2022-08-10 DIAGNOSIS — Z743 Need for continuous supervision: Secondary | ICD-10-CM | POA: Diagnosis not present

## 2022-08-10 DIAGNOSIS — N390 Urinary tract infection, site not specified: Secondary | ICD-10-CM | POA: Diagnosis present

## 2022-08-10 DIAGNOSIS — I4891 Unspecified atrial fibrillation: Secondary | ICD-10-CM | POA: Diagnosis not present

## 2022-08-10 DIAGNOSIS — I480 Paroxysmal atrial fibrillation: Secondary | ICD-10-CM

## 2022-08-10 DIAGNOSIS — I482 Chronic atrial fibrillation, unspecified: Secondary | ICD-10-CM | POA: Diagnosis present

## 2022-08-10 DIAGNOSIS — I509 Heart failure, unspecified: Principal | ICD-10-CM

## 2022-08-10 DIAGNOSIS — I429 Cardiomyopathy, unspecified: Secondary | ICD-10-CM

## 2022-08-10 DIAGNOSIS — Z91012 Allergy to eggs: Secondary | ICD-10-CM | POA: Diagnosis not present

## 2022-08-10 DIAGNOSIS — Z7901 Long term (current) use of anticoagulants: Secondary | ICD-10-CM

## 2022-08-10 DIAGNOSIS — I951 Orthostatic hypotension: Principal | ICD-10-CM

## 2022-08-10 DIAGNOSIS — Z825 Family history of asthma and other chronic lower respiratory diseases: Secondary | ICD-10-CM

## 2022-08-10 DIAGNOSIS — Z8673 Personal history of transient ischemic attack (TIA), and cerebral infarction without residual deficits: Secondary | ICD-10-CM

## 2022-08-10 DIAGNOSIS — J9611 Chronic respiratory failure with hypoxia: Secondary | ICD-10-CM | POA: Diagnosis not present

## 2022-08-10 DIAGNOSIS — J449 Chronic obstructive pulmonary disease, unspecified: Secondary | ICD-10-CM | POA: Diagnosis present

## 2022-08-10 DIAGNOSIS — R0602 Shortness of breath: Secondary | ICD-10-CM | POA: Diagnosis not present

## 2022-08-10 DIAGNOSIS — I6381 Other cerebral infarction due to occlusion or stenosis of small artery: Secondary | ICD-10-CM | POA: Diagnosis not present

## 2022-08-10 DIAGNOSIS — R609 Edema, unspecified: Secondary | ICD-10-CM | POA: Diagnosis not present

## 2022-08-10 DIAGNOSIS — Z043 Encounter for examination and observation following other accident: Secondary | ICD-10-CM | POA: Diagnosis not present

## 2022-08-10 DIAGNOSIS — I959 Hypotension, unspecified: Secondary | ICD-10-CM

## 2022-08-10 DIAGNOSIS — I4819 Other persistent atrial fibrillation: Secondary | ICD-10-CM | POA: Diagnosis present

## 2022-08-10 DIAGNOSIS — I5042 Chronic combined systolic (congestive) and diastolic (congestive) heart failure: Secondary | ICD-10-CM | POA: Diagnosis not present

## 2022-08-10 DIAGNOSIS — R7303 Prediabetes: Secondary | ICD-10-CM | POA: Diagnosis not present

## 2022-08-10 DIAGNOSIS — M8580 Other specified disorders of bone density and structure, unspecified site: Secondary | ICD-10-CM | POA: Diagnosis present

## 2022-08-10 DIAGNOSIS — E669 Obesity, unspecified: Secondary | ICD-10-CM | POA: Diagnosis not present

## 2022-08-10 DIAGNOSIS — I110 Hypertensive heart disease with heart failure: Secondary | ICD-10-CM | POA: Diagnosis present

## 2022-08-10 DIAGNOSIS — B964 Proteus (mirabilis) (morganii) as the cause of diseases classified elsewhere: Secondary | ICD-10-CM | POA: Diagnosis present

## 2022-08-10 DIAGNOSIS — I5041 Acute combined systolic (congestive) and diastolic (congestive) heart failure: Secondary | ICD-10-CM | POA: Diagnosis not present

## 2022-08-10 DIAGNOSIS — G20A1 Parkinson's disease without dyskinesia, without mention of fluctuations: Secondary | ICD-10-CM | POA: Diagnosis present

## 2022-08-10 DIAGNOSIS — R000 Tachycardia, unspecified: Secondary | ICD-10-CM | POA: Diagnosis not present

## 2022-08-10 DIAGNOSIS — I5043 Acute on chronic combined systolic (congestive) and diastolic (congestive) heart failure: Secondary | ICD-10-CM | POA: Diagnosis present

## 2022-08-10 DIAGNOSIS — R531 Weakness: Secondary | ICD-10-CM | POA: Diagnosis not present

## 2022-08-10 DIAGNOSIS — Z6834 Body mass index (BMI) 34.0-34.9, adult: Secondary | ICD-10-CM | POA: Diagnosis not present

## 2022-08-10 DIAGNOSIS — Z833 Family history of diabetes mellitus: Secondary | ICD-10-CM

## 2022-08-10 DIAGNOSIS — S0990XA Unspecified injury of head, initial encounter: Secondary | ICD-10-CM | POA: Diagnosis not present

## 2022-08-10 LAB — COMPREHENSIVE METABOLIC PANEL
ALT: 17 U/L (ref 0–44)
AST: 29 U/L (ref 15–41)
Albumin: 3 g/dL — ABNORMAL LOW (ref 3.5–5.0)
Alkaline Phosphatase: 74 U/L (ref 38–126)
Anion gap: 11 (ref 5–15)
BUN: 26 mg/dL — ABNORMAL HIGH (ref 8–23)
CO2: 32 mmol/L (ref 22–32)
Calcium: 8.7 mg/dL — ABNORMAL LOW (ref 8.9–10.3)
Chloride: 97 mmol/L — ABNORMAL LOW (ref 98–111)
Creatinine, Ser: 1.02 mg/dL — ABNORMAL HIGH (ref 0.44–1.00)
GFR, Estimated: 58 mL/min — ABNORMAL LOW (ref >60)
Glucose, Bld: 106 mg/dL — ABNORMAL HIGH (ref 70–99)
Potassium: 3.4 mmol/L — ABNORMAL LOW (ref 3.5–5.1)
Sodium: 140 mmol/L (ref 135–145)
Total Bilirubin: 0.9 mg/dL (ref 0.3–1.2)
Total Protein: 6.1 g/dL — ABNORMAL LOW (ref 6.5–8.1)

## 2022-08-10 LAB — URINALYSIS, ROUTINE W REFLEX MICROSCOPIC
Bilirubin Urine: NEGATIVE
Glucose, UA: NEGATIVE mg/dL
Hgb urine dipstick: NEGATIVE
Ketones, ur: NEGATIVE mg/dL
Nitrite: POSITIVE — AB
Protein, ur: 100 mg/dL — AB
Specific Gravity, Urine: 1.02 (ref 1.005–1.030)
pH: 8 (ref 5.0–8.0)

## 2022-08-10 LAB — CBC WITH DIFFERENTIAL/PLATELET
Abs Immature Granulocytes: 0.03 10*3/uL (ref 0.00–0.07)
Basophils Absolute: 0 10*3/uL (ref 0.0–0.1)
Basophils Relative: 0 %
Eosinophils Absolute: 0.1 10*3/uL (ref 0.0–0.5)
Eosinophils Relative: 2 %
HCT: 44 % (ref 36.0–46.0)
Hemoglobin: 13.9 g/dL (ref 12.0–15.0)
Immature Granulocytes: 0 %
Lymphocytes Relative: 11 %
Lymphs Abs: 0.9 10*3/uL (ref 0.7–4.0)
MCH: 29.6 pg (ref 26.0–34.0)
MCHC: 31.6 g/dL (ref 30.0–36.0)
MCV: 93.8 fL (ref 80.0–100.0)
Monocytes Absolute: 0.6 10*3/uL (ref 0.1–1.0)
Monocytes Relative: 7 %
Neutro Abs: 6.3 10*3/uL (ref 1.7–7.7)
Neutrophils Relative %: 80 %
Platelets: 132 10*3/uL — ABNORMAL LOW (ref 150–400)
RBC: 4.69 MIL/uL (ref 3.87–5.11)
RDW: 13.7 % (ref 11.5–15.5)
WBC: 7.8 10*3/uL (ref 4.0–10.5)
nRBC: 0 % (ref 0.0–0.2)

## 2022-08-10 LAB — TROPONIN I (HIGH SENSITIVITY)
Troponin I (High Sensitivity): 11 ng/L (ref <18)
Troponin I (High Sensitivity): 9 ng/L (ref <18)

## 2022-08-10 LAB — BRAIN NATRIURETIC PEPTIDE: B Natriuretic Peptide: 783 pg/mL — ABNORMAL HIGH (ref 0.0–100.0)

## 2022-08-10 LAB — MRSA NEXT GEN BY PCR, NASAL: MRSA by PCR Next Gen: NOT DETECTED

## 2022-08-10 MED ORDER — SERTRALINE HCL 50 MG PO TABS
200.0000 mg | ORAL_TABLET | Freq: Every day | ORAL | Status: DC
  Administered 2022-08-11 – 2022-08-17 (×7): 200 mg via ORAL
  Filled 2022-08-10 (×7): qty 4

## 2022-08-10 MED ORDER — PANTOPRAZOLE SODIUM 40 MG PO TBEC
40.0000 mg | DELAYED_RELEASE_TABLET | Freq: Two times a day (BID) | ORAL | Status: DC
  Administered 2022-08-10 – 2022-08-17 (×14): 40 mg via ORAL
  Filled 2022-08-10 (×14): qty 1

## 2022-08-10 MED ORDER — FUROSEMIDE 10 MG/ML IJ SOLN
20.0000 mg | Freq: Once | INTRAMUSCULAR | Status: DC
  Filled 2022-08-10: qty 2

## 2022-08-10 MED ORDER — PRAMIPEXOLE DIHYDROCHLORIDE 1 MG PO TABS
0.5000 mg | ORAL_TABLET | Freq: Every day | ORAL | Status: DC
  Administered 2022-08-10 – 2022-08-16 (×7): 0.5 mg via ORAL
  Filled 2022-08-10 (×7): qty 1

## 2022-08-10 MED ORDER — AMIODARONE HCL 200 MG PO TABS
200.0000 mg | ORAL_TABLET | Freq: Every day | ORAL | Status: DC
  Administered 2022-08-11 – 2022-08-17 (×7): 200 mg via ORAL
  Filled 2022-08-10 (×7): qty 1

## 2022-08-10 MED ORDER — ONDANSETRON HCL 4 MG PO TABS: 4.0000 mg | ORAL_TABLET | Freq: Four times a day (QID) | ORAL | Status: DC | PRN

## 2022-08-10 MED ORDER — GABAPENTIN 300 MG PO CAPS
600.0000 mg | ORAL_CAPSULE | Freq: Four times a day (QID) | ORAL | Status: DC
  Administered 2022-08-10 – 2022-08-17 (×24): 600 mg via ORAL
  Filled 2022-08-10 (×25): qty 2

## 2022-08-10 MED ORDER — POTASSIUM CHLORIDE CRYS ER 20 MEQ PO TBCR
40.0000 meq | EXTENDED_RELEASE_TABLET | Freq: Once | ORAL | Status: AC
  Administered 2022-08-10: 40 meq via ORAL
  Filled 2022-08-10: qty 2

## 2022-08-10 MED ORDER — METOPROLOL TARTRATE 50 MG PO TABS
50.0000 mg | ORAL_TABLET | Freq: Once | ORAL | Status: DC
  Filled 2022-08-10: qty 1

## 2022-08-10 MED ORDER — APIXABAN 5 MG PO TABS
5.0000 mg | ORAL_TABLET | Freq: Two times a day (BID) | ORAL | Status: DC
  Administered 2022-08-10 – 2022-08-17 (×14): 5 mg via ORAL
  Filled 2022-08-10 (×14): qty 1

## 2022-08-10 MED ORDER — POLYETHYLENE GLYCOL 3350 17 G PO PACK
17.0000 g | PACK | Freq: Every day | ORAL | Status: DC | PRN
  Administered 2022-08-11: 17 g via ORAL
  Filled 2022-08-10: qty 1

## 2022-08-10 MED ORDER — ACETAMINOPHEN 650 MG RE SUPP: 650.0000 mg | Freq: Four times a day (QID) | RECTAL | Status: DC | PRN

## 2022-08-10 MED ORDER — ACETAMINOPHEN 325 MG PO TABS: 650.0000 mg | ORAL_TABLET | Freq: Four times a day (QID) | ORAL | Status: DC | PRN

## 2022-08-10 MED ORDER — ONDANSETRON HCL 4 MG/2ML IJ SOLN: 4.0000 mg | Freq: Four times a day (QID) | INTRAMUSCULAR | Status: DC | PRN

## 2022-08-10 MED ORDER — ALBUTEROL SULFATE (2.5 MG/3ML) 0.083% IN NEBU: 2.5000 mg | INHALATION_SOLUTION | Freq: Four times a day (QID) | RESPIRATORY_TRACT | Status: DC | PRN

## 2022-08-10 MED ORDER — ATORVASTATIN CALCIUM 40 MG PO TABS
40.0000 mg | ORAL_TABLET | Freq: Every day | ORAL | Status: DC
  Administered 2022-08-11 – 2022-08-17 (×7): 40 mg via ORAL
  Filled 2022-08-10 (×7): qty 1

## 2022-08-10 NOTE — H&P (Signed)
History and Physical    Allison Rollins:[id number] DOB: Jul 22, 1950 DOA: 08/10/2022  PCP: Gabriel Earing, FNP   Patient coming from: Home  I have personally briefly reviewed patient's old medical records in [facility]  Chief Complaint: Leg Swelling  HPI: Allison Rollins is a 72 y.o. female with medical history significant for congestive heart failure, atrial fibrillation, COPD on chronic 3 L, hypertension. Patient presented to ED with complaints of fall last night.  She was walking to the bathroom with the lights out when she bumped against the wall and fell on the floor, hitting her head.  She was unable to get up after this.,  She had to slide on the floor to the living room to get her walker.  She reports pitting bilateral lower extremity edema over the past 2 days, onset of difficulty breathing.  She denies orthopnea.  No chest pain.  No dizziness.  She has chronic stable productive cough.  She reports compliance with her Lasix and eliquis daily.   Recent hospitalization-3/16 to 3/25 for atrial fibrillation with RVR, and decompensated CHF.  Discharged on amiodarone, metoprolol.  ED Course: Fluctuating blood pressure ranging from 82 systolic to 110.   BNP 783.  Chest x-ray shows cardiomegaly, vascular congestion.  Cervical and Head CT negative for acute abnormality.  Lasix and metoprolol ordered in ED, but later discontinued due to blood pressure intermittently dropping to 80s. Hospitalist admit for decompensated CHF.  Review of Systems: As per HPI all other systems reviewed and negative.  Past Medical History:  Diagnosis Date   Anxiety    Asthma    Chest pain 12/22/2015   CHF (congestive heart failure)    COPD (chronic obstructive pulmonary disease)    Depression    Headache    HTN (hypertension)    Hypokalemia 07/28/2013   Kidney stones    Osteopenia 01/22/2020   Parkinson's disease    Pneumonia    Pre-diabetes    S/P colonoscopy 10/15/04   normal   Vitamin D  deficiency 11/24/2018    Past Surgical History:  Procedure Laterality Date   CHOLECYSTECTOMY     COLONOSCOPY  10/15/2004   Normal rectum/Normal colon   COLONOSCOPY N/A 05/10/2016   Procedure: COLONOSCOPY;  Surgeon: West Bali, MD;  Location: [facility];  Service: Endoscopy;  Laterality: N/A;  10:30   ESOPHAGEAL MANOMETRY N/A 04/02/2019   Procedure: ESOPHAGEAL MANOMETRY (EM);  Surgeon: Napoleon Form, MD;  Location: [facility];  Service: Endoscopy;  Laterality: N/A;   ESOPHAGOGASTRODUODENOSCOPY  09/11/2010   Dysphagia likely multifactorial (possible Candida esophagitis, likely nonspecific esophageal motility disorder, and/or uncontrolled gastroesophageal reflux disease), status post empiric dilation   ESOPHAGOGASTRODUODENOSCOPY N/A 11/13/2018   Dr. Darrick Penna: No endoscopic esophageal abnormality to explain patient's dysphagia.  Esophagus dilated.  Moderate erosive/nodular gastritis with benign biopsies.   KIDNEY STONE SURGERY     SAVORY DILATION N/A 11/13/2018   Procedure: SAVORY DILATION;  Surgeon: West Bali, MD;  Location: [facility];  Service: Endoscopy;  Laterality: N/A;     reports that she quit smoking about 27 years ago. Her smoking use included cigarettes. She started smoking about 57 years ago. She has a 7.50 pack-year smoking history. She has been exposed to tobacco smoke. She has never used smokeless tobacco. She reports that she does not drink alcohol and does not use drugs.  Allergies  Allergen Reactions   Egg Solids, Whole    Farxiga [Dapagliflozin] Swelling   Penicillins Itching  and Rash    Has patient had a PCN reaction causing immediate rash, facial/tongue/throat swelling, SOB or lightheadedness with hypotension: no Has patient had a PCN reaction causing severe rash involving mucus membranes or skin necrosis: no Has patient had a PCN reaction that required hospitalization: no Has patient had a PCN reaction occurring within the last 10 years: no If all of  the above answers are "NO", then may proceed with Cephalosporin use.    Sulfa Antibiotics Rash    Family History  Problem Relation Age of Onset   Coronary artery disease Father    COPD Mother    Asthma Brother    Coronary artery disease Sister    Diabetes Sister    Coronary artery disease Brother    Coronary artery disease Sister     Prior to Admission medications   Medication Sig Start Date End Date Taking? Authorizing Provider  acetaminophen (TYLENOL) 500 MG tablet Take 1 tablet (500 mg total) by mouth every 6 (six) hours as needed. 02/16/22   Daryll Drown, NP  albuterol (PROVENTIL) (2.5 MG/3ML) 0.083% nebulizer solution Take 3 mLs (2.5 mg total) by nebulization every 6 (six) hours as needed for wheezing or shortness of breath. 01/12/18   Sonny Masters, FNP  albuterol (VENTOLIN HFA) 108 (90 Base) MCG/ACT inhaler Inhale 2 puffs into the lungs every 6 (six) hours as needed for wheezing or shortness of breath. 08/18/20   Gabriel Earing, FNP  ALLERGY RELIEF CETIRIZINE 10 MG tablet Take 1 tablet by mouth every day 01/08/22   Gwenlyn Fudge, FNP  alum & mag hydroxide-simeth (MAALOX/MYLANTA) 200-200-20 MG/5ML suspension Take 30 mLs by mouth every 6 (six) hours as needed for indigestion or heartburn. 07/19/22   Shahmehdi, Gemma Payor, MD  amiodarone (PACERONE) 200 MG tablet Take 1 tablet (200 mg total) by mouth 2 (two) times daily for 6 days. 07/19/22 07/25/22  ShahmehdiGemma Payor, MD  amiodarone (PACERONE) 200 MG tablet Take 1 tablet (200 mg total) by mouth daily. 07/26/22 09/24/22  Shahmehdi, Gemma Payor, MD  apixaban (ELIQUIS) 5 MG TABS tablet Take 1 tablet (5 mg total) by mouth 2 (two) times daily. 07/07/22 08/06/22  Newman Nip, NP  Ascorbic Acid (VITAMIN C PO) Take 1 tablet by mouth 3 (three) times daily.    [provider]  atorvastatin (LIPITOR) 40 MG tablet Take 1 tablet by mouth every day 08/02/22   Gabriel Earing, FNP  CALCIUM PO Take 1 tablet by mouth daily.    [provider]  cholecalciferol (VITAMIN D3) 25 MCG (1000 UT) tablet Take 1,000 Units by mouth daily.    [provider]  fluticasone Aleda Grana) 50 MCG/ACT nasal spray Use 2 sprays into each nostril every day 05/09/22   Gabriel Earing, FNP  furosemide (LASIX) 40 MG tablet Take 1 tablet (40 mg total) by mouth daily. 07/20/22   Shahmehdi, Gemma Payor, MD  glucose blood (ACCU-CHEK AVIVA PLUS) test strip Test sugars daily 02/29/20   Dettinger, Elige Radon, MD  metoprolol succinate (TOPROL-XL) 50 MG 24 hr tablet Take 1 tablet (50 mg total) by mouth daily. Take with or immediately following a meal. 07/20/22 09/18/22  Shahmehdi, Gemma Payor, MD  nitroGLYCERIN (NITROSTAT) 0.4 MG SL tablet Place 1 tablet (0.4 mg total) under the tongue every 5 (five) minutes as needed for chest pain. 12/25/15   [name], MD  pantoprazole (PROTONIX) 40 MG tablet Take 1 tablet by mouth twice daily 08/02/22   Harlow Mares  M, FNP  pramipexole (MIRAPEX) 0.5 MG tablet Take 1 tablet by mouth at bedtime 06/02/22   Gabriel Earing, FNP  sertraline (ZOLOFT) 100 MG tablet Take 2 tablets (200 mg total) by mouth daily. 02/24/22   Gabriel Earing, FNP    Physical Exam: Vitals:   08/10/22 1545 08/10/22 1600 08/10/22 1615 08/10/22 1618  BP: (!) 106/90 103/68 100/78 (!) 110/92  Pulse:    (!) 115  Resp: (!) 22 (!) 21 20 (!) 21  Temp:  97.7 F (36.5 C)    TempSrc:  Oral    SpO2:  95% 95% 95%  Weight:      Height:        Constitutional: NAD, calm, comfortable Vitals:   08/10/22 1545 08/10/22 1600 08/10/22 1615 08/10/22 1618  BP: (!) 106/90 103/68 100/78 (!) 110/92  Pulse:    (!) 115  Resp: (!) 22 (!) 21 20 (!) 21  Temp:  97.7 F (36.5 C)    TempSrc:  Oral    SpO2:  95% 95% 95%  Weight:      Height:       Eyes: PERRL, lids and conjunctivae normal ENMT: Mucous membranes are moist.  Neck: normal, supple, no masses, no thyromegaly Respiratory: clear to auscultation bilaterally, no wheezing, no crackles. Normal  respiratory effort. No accessory muscle use.  Cardiovascular: Tachycardic, irregular rate and rhythm, no murmurs / rubs / gallops.  1+ pitting bilateral lower extremity edema.  Extremities warm. Abdomen: no tenderness, no masses palpated. No hepatosplenomegaly. Bowel sounds positive.  Musculoskeletal: no clubbing / cyanosis. No joint deformity upper and lower extremities.  Skin: Ecchymosis to left hand from fall last night, no rashes, lesions, ulcers. No induration Neurologic: No apparent cranial nerve abnormalities, moving extremities spontaneously.Marland Kitchen  Psychiatric: Normal judgment and insight. Alert and oriented x 3. Normal mood.   Labs on Admission: I have personally reviewed following labs and imaging studies  CBC: Recent Labs  Lab 08/10/22 1359  WBC 7.8  NEUTROABS 6.3  HGB 13.9  HCT 44.0  MCV 93.8  PLT 132*   Basic Metabolic Panel: Recent Labs  Lab 08/10/22 1359  NA 140  K 3.4*  CL 97*  CO2 32  GLUCOSE 106*  BUN 26*  CREATININE 1.02*  CALCIUM 8.7*   GFR: Estimated Creatinine Clearance: 59.8 mL/min (A) (by C-G formula based on SCr of 1.02 mg/dL (H)). Liver Function Tests: Recent Labs  Lab 08/10/22 1359  AST 29  ALT 17  ALKPHOS 74  BILITOT 0.9  PROT 6.1*  ALBUMIN 3.0*    Radiological Exams on Admission: CT Head Wo Contrast  Result Date: 08/10/2022 CLINICAL DATA:  Fall EXAM: CT HEAD WITHOUT CONTRAST CT CERVICAL SPINE WITHOUT CONTRAST TECHNIQUE: Multidetector CT imaging of the head and cervical spine was performed following the standard protocol without intravenous contrast. Multiplanar CT image reconstructions of the cervical spine were also generated. RADIATION DOSE REDUCTION: This exam was performed according to the departmental dose-optimization program which includes automated exposure control, adjustment of the mA and/or kV according to patient size and/or use of iterative reconstruction technique. COMPARISON:  CT head and cervical spine 11/22/2019 FINDINGS: CT  HEAD FINDINGS Brain: There is no acute intracranial hemorrhage, extra-axial fluid collection, or acute infarct. Parenchymal volume is normal for age. The ventricles are normal in size. Gray-white differentiation is preserved. There is extensive confluent hypodensity throughout the supratentorial white matter likely reflecting sequela of advanced chronic small-vessel ischemic change. There are small remote lacunar infarcts in the cerebellum and  right corona radiata. The pituitary and suprasellar region are normal. There is no mass lesion. There is no mass effect or midline shift. Vascular: There is calcification of the bilateral carotid siphons. Skull: Normal. Negative for fracture or focal lesion. Sinuses/Orbits: There is mild mucosal thickening in the left maxillary sinus. The globes and orbits are unremarkable. Other: None. CT CERVICAL SPINE FINDINGS Alignment: Normal. Skull base and vertebrae: Skull base alignment is maintained. Vertebral body heights are preserved. There is no evidence of acute fracture. There is no suspicious osseous lesion. Soft tissues and spinal canal: No prevertebral fluid or swelling. No visible canal hematoma. Disc levels: There is overall mild degenerative change of the cervical spine without evidence of high-grade osseous spinal canal or neural foraminal stenosis. Upper chest: The imaged portions of the lung apices are clear. Other: None. IMPRESSION: 1. No acute intracranial pathology. 2. No acute fracture or traumatic malalignment of the cervical spine. Electronically Signed   By: Lesia Hausen M.D.   On: 08/10/2022 15:04   CT Cervical Spine Wo Contrast  Result Date: 08/10/2022 CLINICAL DATA:  Fall EXAM: CT HEAD WITHOUT CONTRAST CT CERVICAL SPINE WITHOUT CONTRAST TECHNIQUE: Multidetector CT imaging of the head and cervical spine was performed following the standard protocol without intravenous contrast. Multiplanar CT image reconstructions of the cervical spine were also generated.  RADIATION DOSE REDUCTION: This exam was performed according to the departmental dose-optimization program which includes automated exposure control, adjustment of the mA and/or kV according to patient size and/or use of iterative reconstruction technique. COMPARISON:  CT head and cervical spine 11/22/2019 FINDINGS: CT HEAD FINDINGS Brain: There is no acute intracranial hemorrhage, extra-axial fluid collection, or acute infarct. Parenchymal volume is normal for age. The ventricles are normal in size. Gray-white differentiation is preserved. There is extensive confluent hypodensity throughout the supratentorial white matter likely reflecting sequela of advanced chronic small-vessel ischemic change. 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 The pituitary and suprasellar region are normal. There is no mass lesion. There is no mass effect or midline shift. Vascular: There is calcification of the bilateral carotid siphons. Skull: Normal. Negative for fracture or focal lesion. Sinuses/Orbits: There is mild mucosal thickening in the left maxillary sinus. The globes and orbits are unremarkable. Other: None. CT CERVICAL SPINE FINDINGS Alignment: Normal. Skull base and vertebrae: Skull base alignment is maintained. Vertebral body heights are preserved. There is no evidence of acute fracture. There is no suspicious osseous lesion. Soft tissues and spinal canal: No prevertebral fluid or swelling. No visible canal hematoma. Disc levels: There is overall mild degenerative change of the cervical spine without evidence of high-grade osseous spinal canal or neural foraminal stenosis. Upper chest: The imaged portions of the lung apices are clear. Other: None. IMPRESSION: 1. No acute intracranial pathology. 2. No acute fracture or traumatic malalignment of the cervical spine. Electronically Signed   By: Lesia Hausen M.D.   On: 08/10/2022 15:04   DG Chest 2 View  Result Date:  08/10/2022 CLINICAL DATA:  Shortness of breath. EXAM: CHEST - 2 VIEW COMPARISON:  X-ray 07/10/2022 FINDINGS: Enlarged cardiopericardial silhouette with calcified aorta. Vascular congestion. Hyperinflation. No pneumothorax or effusion. Frontal view is rotated to the left. Lateral view is under penetrated with motion. Degenerative changes are seen of the spine. Overlapping cardiac leads. IMPRESSION: Limited x-rays. Enlarged heart with some vascular congestion. Electronically Signed   By: Karen Kays M.D.   On: 08/10/2022 14:57    EKG: Independently reviewed.  Atrial fibrillation rate 111.  QTc 472.  No significant change from prior.  Assessment/Plan Principal Problem:   Acute on chronic combined systolic and diastolic CHF (congestive heart failure) Active Problems:   Atrial fibrillation   Essential hypertension   Chronic obstructive pulmonary disease   Chronic respiratory failure with hypoxia  Assessment and Plan: * Acute on chronic combined systolic and diastolic CHF (congestive heart failure) 1+ pitting bilateral lower extremity edema to mid leg, chest x-ray showing vascular congestion.  O2 sat stable on chronic 3 L O2.  BNP 783 same as recent hospitalization for CHF.  Blood pressure soft, fluctuating from 80s to low 100s.  Previously diastolic CHF, last echo 06/2022 - showing new reduced EF of 40 to 45%. -Hold off on diuretics for now -May need to start pressors to help with diuresis if persistent hypotension  Atrial fibrillation Mostly controlled.  Heart rate 60s to low 100s.  On amiodarone, metoprolol and anticoagulation with Eliquis. -Resume amiodarone, Eliquis -Hold metoprolol  Essential hypertension Systolic blood pressure currently ranging from 80s to low 100s. -Hold metoprolol, Lasix  Chronic respiratory failure with hypoxia On home 3 L.  Sats 93 to 95%.   DVT prophylaxis: Eliquis Code Status: FULL code-confirmed with patient at bedside Family Communication: None at  bedside. Disposition Plan: ~/> 2 days Consults called: None Admission status: Inpt Stepdown I certify that at the point of admission it is my clinical judgment that the patient will require inpatient hospital care spanning beyond 2 midnights from the point of admission due to high intensity of service, high risk for further deterioration and high frequency of surveillance required.    Author: Onnie Boer, MD 08/10/2022 7:09 PM  For on call review [email or web address].

## 2022-08-10 NOTE — ED Notes (Signed)
Dr Mariea Clonts aware of bp and was advised vo to hold lasix and metoprolol. She is in with pt now and may change pt to stepdown.

## 2022-08-10 NOTE — Assessment & Plan Note (Signed)
Mostly controlled.  Heart rate 60s to low 100s.  On amiodarone, metoprolol and anticoagulation with Eliquis. -Resume amiodarone, Eliquis -Hold metoprolol

## 2022-08-10 NOTE — ED Provider Notes (Addendum)
[place] [facility] DEPARTMENT AT [facility] Provider Note   CSN: [id number] Arrival date & time: 08/10/22  1239     History  Chief Complaint  Patient presents with   Allison Rollins is a 72 y.o. female history of diastolic CHF, stroke, COPD, A-fib, hypertension presented after a mechanical fall.  Patient is on Eliquis and stated that she turned at the lights in her bedroom and while walking to her bed tripped and fell backwards and hit the back of her head and lost consciousness for a few seconds.  Patient denied any vision changes, new onset weakness, changes in sensation was noted the back of her head hurts from the fall.  Patient denied pain elsewhere but does note that her legs are swollen however adds this is chronic and that she takes her Lasix daily.  Patient dates last week she had pneumonia and still feels short of breath from the pneumonia and notes that her urine has had specks of blood in it the past few days.  Patient is normally on 3 L of oxygen at home.  Patient was also noted some mild dysuria at the past few days as well.  Patient denied neck pain, chest pain, shortness of breath, abdominal pain, nausea/vomiting, fever  Last echo 07/11/2022: LVEF 40 to 45%  Home Medications Prior to Admission medications   Medication Sig Start Date End Date Taking? Authorizing Provider  famotidine (PEPCID) 20 MG tablet Take 20 mg by mouth daily. 07/29/22  Yes [provider]  gabapentin (NEURONTIN) 300 MG capsule Take by mouth. 07/29/22  Yes [provider]  lisinopril (ZESTRIL) 10 MG tablet Take 10 mg by mouth daily. 07/29/22  Yes [provider]  metoprolol succinate (TOPROL-XL) 50 MG 24 hr tablet Take 1 tablet (50 mg total) by mouth daily. Take with or immediately following a meal. 07/20/22 09/18/22 Yes Shahmehdi, Seyed A, MD  albuterol (PROVENTIL) (2.5 MG/3ML) 0.083% nebulizer solution Take 3 mLs (2.5 mg total) by nebulization every 6 (six) hours  as needed for wheezing or shortness of breath. 01/12/18   Sonny Masters, FNP  albuterol (VENTOLIN HFA) 108 (90 Base) MCG/ACT inhaler Inhale 2 puffs into the lungs every 6 (six) hours as needed for wheezing or shortness of breath. 08/18/20   Gabriel Earing, FNP  ALLERGY RELIEF CETIRIZINE 10 MG tablet Take 1 tablet by mouth every day 01/08/22   Gwenlyn Fudge, FNP  alum & mag hydroxide-simeth (MAALOX/MYLANTA) 200-200-20 MG/5ML suspension Take 30 mLs by mouth every 6 (six) hours as needed for indigestion or heartburn. 07/19/22   Shahmehdi, Gemma Payor, MD  amiodarone (PACERONE) 200 MG tablet Take 1 tablet (200 mg total) by mouth 2 (two) times daily for 6 days. 07/19/22 07/25/22  ShahmehdiGemma Payor, MD  amiodarone (PACERONE) 200 MG tablet Take 1 tablet (200 mg total) by mouth daily. 07/26/22 09/24/22  Shahmehdi, Gemma Payor, MD  apixaban (ELIQUIS) 5 MG TABS tablet Take 1 tablet (5 mg total) by mouth 2 (two) times daily. 07/07/22 08/06/22  Newman Nip, NP  Ascorbic Acid (VITAMIN C PO) Take 1 tablet by mouth 3 (three) times daily.    [provider]  atorvastatin (LIPITOR) 40 MG tablet Take 1 tablet by mouth every day 08/02/22   Gabriel Earing, FNP  CALCIUM PO Take 1 tablet by mouth daily.    [provider]  cholecalciferol (VITAMIN D3) 25 MCG (1000 UT) tablet Take 1,000 Units by mouth daily.  [provider]  fluticasone Aleda Grana) 50 MCG/ACT nasal spray Use 2 sprays into each nostril every day 05/09/22   Gabriel Earing, FNP  furosemide (LASIX) 40 MG tablet Take 1 tablet (40 mg total) by mouth daily. 07/20/22   Kendell Bane, MD  glucose blood (ACCU-CHEK AVIVA PLUS) test strip Test sugars daily 02/29/20   Dettinger, Elige Radon, MD  nitroGLYCERIN (NITROSTAT) 0.4 MG SL tablet Place 1 tablet (0.4 mg total) under the tongue every 5 (five) minutes as needed for chest pain. 12/25/15   [name], MD  pantoprazole (PROTONIX) 40 MG tablet Take 1 tablet by mouth twice daily 08/02/22    Gabriel Earing, FNP  pramipexole (MIRAPEX) 0.5 MG tablet Take 1 tablet by mouth at bedtime 06/02/22   Gabriel Earing, FNP  sertraline (ZOLOFT) 100 MG tablet Take 2 tablets (200 mg total) by mouth daily. 02/24/22   Gabriel Earing, FNP      Allergies    Egg solids, whole; Farxiga [dapagliflozin]; Penicillins; and Sulfa antibiotics    Review of Systems   Review of Systems See HPI Physical Exam Updated Vital Signs BP 102/81 (BP Location: Left Arm)   Pulse (!) 115   Temp 97.7 F (36.5 C) (Oral)   Resp 18   Ht 5\' 6"  (1.676 m)   Wt 101 kg   SpO2 95%   BMI 35.94 kg/m  Physical Exam Constitutional:      General: She is not in acute distress. HENT:     Head: Normocephalic and atraumatic.     Comments: Tender to palpation at the crown of the head however no step-off/crepitus/abnormalities palpated No obvious deformities noted    Right Ear: Tympanic membrane, ear canal and external ear normal.     Left Ear: Tympanic membrane, ear canal and external ear normal.     Nose: Nose normal.     Mouth/Throat:     Mouth: Mucous membranes are moist.     Pharynx: No oropharyngeal exudate.  Eyes:     Extraocular Movements: Extraocular movements intact.     Conjunctiva/sclera: Conjunctivae normal.     Pupils: Pupils are equal, round, and reactive to light.  Neck:     Comments: Initially did not endorse any midline tenderness however on recheck patient began endorsing neck pain and was tender to palpation in the midline No step-off/crepitus/abnormalities palpated Cardiovascular:     Rate and Rhythm: Regular rhythm. Tachycardia present.     Pulses: Normal pulses.     Heart sounds: Normal heart sounds. No murmur heard. Pulmonary:     Effort: Pulmonary effort is normal. No respiratory distress.     Breath sounds: Normal breath sounds.  Abdominal:     Palpations: Abdomen is soft.     Tenderness: There is no abdominal tenderness. There is no guarding or rebound.  Musculoskeletal:         General: Normal range of motion.     Cervical back: Normal range of motion.     Right lower leg: Edema present.     Left lower leg: Edema present.     Comments: 2+ bilateral pitting edema in lower legs and feet  Skin:    General: Skin is warm and dry.     Capillary Refill: Capillary refill takes less than 2 seconds.  Neurological:     General: No focal deficit present.     Mental Status: She is alert and oriented to person, place, and time.     Sensory: Sensation  is intact.     Motor: Motor function is intact.     Coordination: Coordination is intact.     Comments: Visual acuity is grossly intact Cranial nerves III through XII intact Patient unable to ambulate without a cane and her cane is not present  Psychiatric:        Mood and Affect: Mood normal.     ED Results / Procedures / Treatments   Labs (all labs ordered are listed, but only abnormal results are displayed) Labs Reviewed  COMPREHENSIVE METABOLIC PANEL - Abnormal; Notable for the following components:      Result Value   Potassium 3.4 (*)    Chloride 97 (*)    Glucose, Bld 106 (*)    BUN 26 (*)    Creatinine, Ser 1.02 (*)    Calcium 8.7 (*)    Total Protein 6.1 (*)    Albumin 3.0 (*)    GFR, Estimated 58 (*)    All other components within normal limits  BRAIN NATRIURETIC PEPTIDE - Abnormal; Notable for the following components:   B Natriuretic Peptide 783.0 (*)    All other components within normal limits  CBC WITH DIFFERENTIAL/PLATELET - Abnormal; Notable for the following components:   Platelets 132 (*)    All other components within normal limits  URINALYSIS, ROUTINE W REFLEX MICROSCOPIC  TROPONIN I (HIGH SENSITIVITY)  TROPONIN I (HIGH SENSITIVITY)    EKG None  Radiology CT Head Wo Contrast  Result Date: 08/10/2022 CLINICAL DATA:  Fall EXAM: CT HEAD WITHOUT CONTRAST CT CERVICAL SPINE WITHOUT CONTRAST TECHNIQUE: Multidetector CT imaging of the head and cervical spine was performed following the  standard protocol without intravenous contrast. Multiplanar CT image reconstructions of the cervical spine were also generated. RADIATION DOSE REDUCTION: This exam was performed according to the departmental dose-optimization program which includes automated exposure control, adjustment of the mA and/or kV according to patient size and/or use of iterative reconstruction technique. COMPARISON:  CT head and cervical spine 11/22/2019 FINDINGS: CT HEAD FINDINGS Brain: There is no acute intracranial hemorrhage, extra-axial fluid collection, or acute infarct. Parenchymal volume is normal for age. The ventricles are normal in size. Gray-white differentiation is preserved. There is extensive confluent hypodensity throughout the supratentorial white matter likely reflecting sequela of advanced chronic small-vessel ischemic change. 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 The pituitary and suprasellar region are normal. There is no mass lesion. There is no mass effect or midline shift. Vascular: There is calcification of the bilateral carotid siphons. Skull: Normal. Negative for fracture or focal lesion. Sinuses/Orbits: There is mild mucosal thickening in the left maxillary sinus. The globes and orbits are unremarkable. Other: None. CT CERVICAL SPINE FINDINGS Alignment: Normal. Skull base and vertebrae: Skull base alignment is maintained. Vertebral body heights are preserved. There is no evidence of acute fracture. There is no suspicious osseous lesion. Soft tissues and spinal canal: No prevertebral fluid or swelling. No visible canal hematoma. Disc levels: There is overall mild degenerative change of the cervical spine without evidence of high-grade osseous spinal canal or neural foraminal stenosis. Upper chest: The imaged portions of the lung apices are clear. Other: None. IMPRESSION: 1. No acute intracranial pathology. 2. No acute fracture or traumatic malalignment of the cervical  spine. Electronically Signed   By: Lesia Hausen M.D.   On: 08/10/2022 15:04   CT Cervical Spine Wo Contrast  Result Date: 08/10/2022 CLINICAL DATA:  Fall EXAM: CT HEAD WITHOUT CONTRAST CT CERVICAL  SPINE WITHOUT CONTRAST TECHNIQUE: Multidetector CT imaging of the head and cervical spine was performed following the standard protocol without intravenous contrast. Multiplanar CT image reconstructions of the cervical spine were also generated. RADIATION DOSE REDUCTION: This exam was performed according to the departmental dose-optimization program which includes automated exposure control, adjustment of the mA and/or kV according to patient size and/or use of iterative reconstruction technique. COMPARISON:  CT head and cervical spine 11/22/2019 FINDINGS: CT HEAD FINDINGS Brain: There is no acute intracranial hemorrhage, extra-axial fluid collection, or acute infarct. Parenchymal volume is normal for age. The ventricles are normal in size. Gray-white differentiation is preserved. There is extensive confluent hypodensity throughout the supratentorial white matter likely reflecting sequela of advanced chronic small-vessel ischemic change. 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 The pituitary and suprasellar region are normal. There is no mass lesion. There is no mass effect or midline shift. Vascular: There is calcification of the bilateral carotid siphons. Skull: Normal. Negative for fracture or focal lesion. Sinuses/Orbits: There is mild mucosal thickening in the left maxillary sinus. The globes and orbits are unremarkable. Other: None. CT CERVICAL SPINE FINDINGS Alignment: Normal. Skull base and vertebrae: Skull base alignment is maintained. Vertebral body heights are preserved. There is no evidence of acute fracture. There is no suspicious osseous lesion. Soft tissues and spinal canal: No prevertebral fluid or swelling. No visible canal hematoma. Disc levels: There is  overall mild degenerative change of the cervical spine without evidence of high-grade osseous spinal canal or neural foraminal stenosis. Upper chest: The imaged portions of the lung apices are clear. Other: None. IMPRESSION: 1. No acute intracranial pathology. 2. No acute fracture or traumatic malalignment of the cervical spine. Electronically Signed   By: Lesia Hausen M.D.   On: 08/10/2022 15:04   DG Chest 2 View  Result Date: 08/10/2022 CLINICAL DATA:  Shortness of breath. EXAM: CHEST - 2 VIEW COMPARISON:  X-ray 07/10/2022 FINDINGS: Enlarged cardiopericardial silhouette with calcified aorta. Vascular congestion. Hyperinflation. No pneumothorax or effusion. Frontal view is rotated to the left. Lateral view is under penetrated with motion. Degenerative changes are seen of the spine. Overlapping cardiac leads. IMPRESSION: Limited x-rays. Enlarged heart with some vascular congestion. Electronically Signed   By: Karen Kays M.D.   On: 08/10/2022 14:57    Procedures .Critical Care  Performed by: Netta Corrigan, PA-C Authorized by: Netta Corrigan, PA-C   Critical care provider statement:    Critical care time (minutes):  30   Critical care time was exclusive of:  Separately billable procedures and treating other patients   Critical care was necessary to treat or prevent imminent or life-threatening deterioration of the following conditions:  Cardiac failure   Critical care was time spent personally by me on the following activities:  Development of treatment plan with patient or surrogate, discussions with consultants, evaluation of patient's response to treatment, examination of patient, obtaining history from patient or surrogate, review of old charts, re-evaluation of patient's condition, pulse oximetry, ordering and review of radiographic studies, ordering and review of laboratory studies and ordering and performing treatments and interventions   I assumed direction of critical care for this  patient from another provider in my specialty: no     Care discussed with: admitting provider       Medications Ordered in ED Medications  metoprolol tartrate (LOPRESSOR) tablet 50 mg (has no administration in time range)  furosemide (LASIX) injection 20 mg (has no administration in  time range)    ED Course/ Medical Decision Making/ A&P                             Medical Decision Making Amount and/or Complexity of Data Reviewed Labs: ordered. Radiology: ordered.  Risk Prescription drug management. Decision regarding hospitalization.   Denyse Dago Dripps 72 y.o. presented today for fall. Working DDx that I considered at this time includes, but not limited to, ICH, epidural/subdural hematoma, basilar skull fracture, cervical spine fracture, cervical dislocation, CHF exacerbation, pneumonia, UTI.  R/o DDx: Cervical spine fracture, basilar skull fracture, cervical dislocation, CHF exacerbation, pneumonia, pleural effusion: These are considered less likely due to history of present illness and physical exam findings  Review of prior external notes: 07/19/2022 discharge summary  Unique Tests and My Interpretation:  UA: BNP: 783 which is similar to previous reading a month ago CBC with differential: Unremarkable CMP: Similar to previous reading Troponin: 11, 9 Chest x-ray: No acute cardiopulmonary changes CT head without contrast: No acute changes CT cervical spine without contrast: No acute changes EKG: A-fib with a rate of 111 bpm, no ST elevations or depressions noted or blocks  Discussion with Independent Historian: None  Discussion of Management of Tests:  Emokpae, MD Hospitalist  Risk: High: hospitalization or escalation of hospital-level care  Risk Stratification Score: none  Staffed with Jeraldine Loots, MD and Estell Harpin, MD  Plan: Patient presented for a fall. On exam patient was in no acute distress and had stable vitals.  Initially patient's oxygen was 87% on 3 L however  on recheck her oxygen was 96% on 3 L which is consistent with her baseline.  Initially patient did not have any midline cervical tenderness however on recheck patient endorsed midline tenderness and so in addition to head CT and chest x-ray a CT of the cervical spine was obtained.  Patient's BNP was 783 which is slightly elevated from previous reading a month ago.  Patient states she takes her Lasix daily and that her legs are chronically as edematous.  On exam patient's 2+ bilateral pitting edema in legs and feet however patient still had good pulses sensation and motor skill.  Patient's imaging was were unremarkable for any acute changes or life-threatening diagnoses.  Nurse noted that patient's blood pressure dropped to 89/71 but due to patient's elevated BNP and fluid overload on exam I am hesitant to start fluids as this may put into flash pulmonary edema.  Dr. Carmie Kanner was notified and after patient has orthostatics patient's blood pressure will be reevaluated.  Patient's blood pressure is stable after orthostatics patient may be discharged.  If patient's blood pressure is not stable after orthostatics cardiology will be consulted.  Patient's orthostatics were reassuring.  Pending UA due to patient's reported dysuria patient will be discharged with outpatient follow-up.  Patient will need to follow-up with her cardiologist regarding her Lasix as she may need an increased dose of diuretics due to her BNP being persistently elevated and appearing fluid overloaded in her legs.  Patient stable at this time.  After speak with Dr. Estell Harpin we agreed patient would need to be admitted due to suspected CHF exacerbation due to elevated BNP and persistent shortness of breath along with patient being tachypneic at 21.  Patient's heart rate is also 115 and A-fib.  Patient states she has not taken her metoprolol today and will be given metoprolol in the ED along with 20 mg of Lasix IV as patient  does appear fluid  overloaded.  Patient will need admission for possible diuresis.  Patient stable at this time for admission.  Hospitalist was consulted and accepted patient for admission.  Patient is stable for admission at this time.         Final Clinical Impression(s) / ED Diagnoses Final diagnoses:  Acute on chronic congestive heart failure, unspecified heart failure type  Shortness of breath  Atrial fibrillation, unspecified type    Rx / DC Orders ED Discharge Orders     None         Remi Deter 08/10/22 1705    Netta Corrigan, PA-C 08/10/22 1706    Bethann Berkshire, MD 08/18/22 1024

## 2022-08-10 NOTE — Assessment & Plan Note (Signed)
On home 3 L.  Sats 93 to 95%.

## 2022-08-10 NOTE — Assessment & Plan Note (Signed)
Systolic blood pressure currently ranging from 80s to low 100s. -Hold metoprolol, Lasix

## 2022-08-10 NOTE — ED Notes (Signed)
Unable to give metoprolol and lasix at this time due to bp. EDP aware of bp. Pt asymptomatic. Requesting food. Diet ordered

## 2022-08-10 NOTE — ED Notes (Signed)
Lab in for blood draw.

## 2022-08-10 NOTE — Assessment & Plan Note (Signed)
1+ pitting bilateral lower extremity edema to mid leg, chest x-ray showing vascular congestion.  O2 sat stable on chronic 3 L O2.  BNP 783 same as recent hospitalization for CHF.  Blood pressure soft, fluctuating from 80s to low 100s.  Previously diastolic CHF, last echo 06/2022 - showing new reduced EF of 40 to 45%. -Hold off on diuretics for now -May need to start pressors to help with diuresis if persistent hypotension

## 2022-08-10 NOTE — ED Triage Notes (Signed)
Patient from home for fall. EMS reports patient hit her head; denies LOC. Patient does take blood thinners. EMS reports BS 127. Upon arrival to ER, patient is alert and oriented; reports bilateral leg pain from edema

## 2022-08-11 DIAGNOSIS — I951 Orthostatic hypotension: Secondary | ICD-10-CM | POA: Diagnosis not present

## 2022-08-11 DIAGNOSIS — I4819 Other persistent atrial fibrillation: Secondary | ICD-10-CM | POA: Diagnosis not present

## 2022-08-11 DIAGNOSIS — I5022 Chronic systolic (congestive) heart failure: Secondary | ICD-10-CM

## 2022-08-11 DIAGNOSIS — I5043 Acute on chronic combined systolic (congestive) and diastolic (congestive) heart failure: Secondary | ICD-10-CM | POA: Diagnosis not present

## 2022-08-11 LAB — BASIC METABOLIC PANEL
Anion gap: 9 (ref 5–15)
BUN: 21 mg/dL (ref 8–23)
CO2: 34 mmol/L — ABNORMAL HIGH (ref 22–32)
Calcium: 8.7 mg/dL — ABNORMAL LOW (ref 8.9–10.3)
Chloride: 98 mmol/L (ref 98–111)
Creatinine, Ser: 1.01 mg/dL — ABNORMAL HIGH (ref 0.44–1.00)
GFR, Estimated: 59 mL/min — ABNORMAL LOW (ref >60)
Glucose, Bld: 121 mg/dL — ABNORMAL HIGH (ref 70–99)
Potassium: 4 mmol/L (ref 3.5–5.1)
Sodium: 141 mmol/L (ref 135–145)

## 2022-08-11 MED ORDER — MIDODRINE HCL 5 MG PO TABS: 5.0000 mg | ORAL_TABLET | Freq: Two times a day (BID) | ORAL | Status: DC

## 2022-08-11 MED ORDER — SODIUM CHLORIDE 0.9 % IV SOLN
1.0000 g | INTRAVENOUS | Status: AC
  Administered 2022-08-11 – 2022-08-13 (×3): 1 g via INTRAVENOUS
  Filled 2022-08-11 (×3): qty 10

## 2022-08-11 MED ORDER — MIDODRINE HCL 5 MG PO TABS
5.0000 mg | ORAL_TABLET | Freq: Two times a day (BID) | ORAL | Status: DC
  Administered 2022-08-11 – 2022-08-13 (×5): 5 mg via ORAL
  Filled 2022-08-11 (×5): qty 1

## 2022-08-11 MED ORDER — CHLORHEXIDINE GLUCONATE CLOTH 2 % EX PADS
6.0000 | MEDICATED_PAD | Freq: Every day | CUTANEOUS | Status: DC
  Administered 2022-08-11 – 2022-08-15 (×5): 6 via TOPICAL

## 2022-08-11 MED ORDER — SALINE SPRAY 0.65 % NA SOLN
1.0000 | NASAL | Status: DC | PRN
  Filled 2022-08-11: qty 44

## 2022-08-11 NOTE — Progress Notes (Signed)
Progress Notes  RN called due to patient complaining of burning sensation with urination.  Urinalysis was positive for nitrite, small leukocytes, many bacteria.  Patient was noted to have been positive for E. coli from urine culture done on 09/05/2021 and this was sensitive to ceftriaxone, so, patient will be started on ceftriaxone at this time. Urine culture pending

## 2022-08-11 NOTE — TOC Progression Note (Signed)
  Transition of Care [facility]) Screening Note   Patient Details  Name: Allison Rollins Date of Birth: 1951/03/23   [facility]) CM/SW Contact:    Leitha Bleak, RN Phone Number: 08/11/2022, 3:09 PM  TOC following, DC planning 2-3 days  Transition of Care Department [facility]) has reviewed patient and no TOC needs have been identified at this time. We will continue to monitor patient advancement through interdisciplinary progression rounds. If new patient transition needs arise, please place a TOC consult.       Barriers to Discharge: Continued Medical Work up  Expected Discharge Plan and Services        Social Determinants of Health (SDOH) Interventions SDOH Screenings   Food Insecurity: No Food Insecurity (08/10/2022)  Housing: Low Risk  (08/10/2022)  Transportation Needs: No Transportation Needs (08/10/2022)  Utilities: Not At Risk (08/10/2022)  Alcohol Screen: Low Risk  (04/16/2022)  Depression (PHQ2-9): Low Risk  (04/16/2022)  Recent Concern: Depression (PHQ2-9) - High Risk (02/24/2022)  Financial Resource Strain: Medium Risk (04/16/2022)  Physical Activity: Insufficiently Active (04/16/2022)  Social Connections: Moderately Integrated (04/16/2022)  Recent Concern: Social Connections - Socially Isolated (03/24/2022)  Stress: No Stress Concern Present (04/16/2022)  Tobacco Use: Medium Risk (08/10/2022)    Readmission Risk Interventions     No data to display

## 2022-08-11 NOTE — Plan of Care (Signed)
  Problem: Education: Goal: Knowledge of General Education information will improve Description: Including pain rating scale, medication(s)/side effects and non-pharmacologic comfort measures Outcome: Not Progressing   Problem: Health Behavior/Discharge Planning: Goal: Ability to manage health-related needs will improve Outcome: Not Progressing   Problem: Clinical Measurements: Goal: Ability to maintain clinical measurements within normal limits will improve Outcome: Not Progressing Goal: Will remain free from infection Outcome: Not Progressing Goal: Diagnostic test results will improve Outcome: Not Progressing Goal: Respiratory complications will improve Outcome: Not Progressing Goal: Cardiovascular complication will be avoided Outcome: Not Progressing   Problem: Activity: Goal: Risk for activity intolerance will decrease Outcome: Not Progressing   Problem: Nutrition: Goal: Adequate nutrition will be maintained Outcome: Not Progressing   Problem: Coping: Goal: Level of anxiety will decrease Outcome: Not Progressing   Problem: Elimination: Goal: Will not experience complications related to bowel motility Outcome: Not Progressing Goal: Will not experience complications related to urinary retention Outcome: Not Progressing   Problem: Pain Managment: Goal: General experience of comfort will improve Outcome: Not Progressing   Problem: Safety: Goal: Ability to remain free from injury will improve Outcome: Not Progressing   Problem: Skin Integrity: Goal: Risk for impaired skin integrity will decrease Outcome: Not Progressing   Problem: Education: Goal: Ability to demonstrate management of disease process will improve Outcome: Not Progressing Goal: Ability to verbalize understanding of medication therapies will improve Outcome: Not Progressing Goal: Individualized Educational Video(s) Outcome: Not Progressing   Problem: Activity: Goal: Capacity to carry out  activities will improve Outcome: Not Progressing   Problem: Cardiac: Goal: Ability to achieve and maintain adequate cardiopulmonary perfusion will improve Outcome: Not Progressing   

## 2022-08-11 NOTE — Progress Notes (Signed)
PROGRESS NOTE    Allison Rollins  ZOX:[id number] DOB: [birth year] DOA: 08/10/2022 PCP: Gabriel Earing, FNP   Brief Narrative:    Allison Rollins is a 72 y.o. female with medical history significant for congestive heart failure, atrial fibrillation, COPD on chronic 3 L, hypertension. Patient presented to ED with complaints of fall last night.  She was admitted with concern for acute on chronic combined systolic and diastolic CHF exacerbation, but was noted to be hypotensive and is likely having some autonomic dysfunction in the setting of her Parkinson's disease with recurrent orthostasis.  She was started on midodrine twice daily  Assessment & Plan:   Principal Problem:   Acute on chronic combined systolic and diastolic CHF (congestive heart failure) Active Problems:   Atrial fibrillation   Essential hypertension   Chronic obstructive pulmonary disease   Chronic respiratory failure with hypoxia  Assessment and Plan:  Mechanical fall at home with some associated hypotension -No syncopal episode noted -Agree with cardiology recommendations for midodrine twice daily  HFmrEF -Noted to have LVEF 40-45% recently -No need for diuresis at this time -Appreciate cardiology evaluation  Atrial fibrillation-rate controlled -Continue amiodarone and Eliquis -Hold metoprolol  Chronic hypoxemic respiratory failure -Continue 3 L nasal cannula oxygen  Obesity -BMI 34.23  DVT prophylaxis:Eliquis Code Status: Full Family Communication: None at bedside Disposition Plan:  Status is: Inpatient Remains inpatient appropriate because: Need for IV medications.  Consultants:  Cardiology  Procedures:  None  Antimicrobials:  None   Subjective: Patient seen and evaluated today with no new acute complaints or concerns. No acute concerns or events noted overnight.  Objective: Vitals:   08/11/22 0730 08/11/22 0800 08/11/22 0900 08/11/22 1000  BP:  95/76 (!) 91/56 95/72  Pulse:   91 87   Resp:  Temp: 97.6 F (36.4 C)     TempSrc: Oral     SpO2:  100% 100% 94%  Weight:      Height:        Intake/Output Summary (Last 24 hours) at 08/11/2022 1217 Last data filed at 08/11/2022 8119 Gross per 24 hour  Intake 360 ml  Output 450 ml  Net -90 ml   Filed Weights   08/10/22 1250 08/10/22 1854 08/11/22 0247  Weight: 101 kg 96.2 kg 96.2 kg    Examination:  General exam: Appears calm and comfortable  Respiratory system: Clear to auscultation. Respiratory effort normal.  3 L nasal cannula Cardiovascular system: S1 & S2 heard, irregular and tachycardic. Gastrointestinal system: Abdomen is soft Central nervous system: Alert and awake Extremities: No edema Skin: No significant lesions noted Psychiatry: Flat affect.    Data Reviewed: I have personally reviewed following labs and imaging studies  CBC: Recent Labs  Lab 08/10/22 1359  WBC 7.8  NEUTROABS 6.3  HGB 13.9  HCT 44.0  MCV 93.8  PLT 132*   Basic Metabolic Panel: Recent Labs  Lab 08/10/22 1359 08/11/22 0518  NA 140 141  K 3.4* 4.0  CL 97* 98  CO2 32 34*  GLUCOSE 106* 121*  BUN 26* 21  CREATININE 1.02* 1.01*  CALCIUM 8.7* 8.7*   GFR: Estimated Creatinine Clearance: 58.9 mL/min (A) (by C-G formula based on SCr of 1.01 mg/dL (H)). Liver Function Tests: Recent Labs  Lab 08/10/22 1359  AST 29  ALT 17  ALKPHOS 74  BILITOT 0.9  PROT 6.1*  ALBUMIN 3.0*   No results for input(s): "LIPASE", "AMYLASE" in the last 168 hours. No results for  input(s): "AMMONIA" in the last 168 hours. Coagulation Profile: No results for input(s): "INR", "PROTIME" in the last 168 hours. Cardiac Enzymes: No results for input(s): "CKTOTAL", "CKMB", "CKMBINDEX", "TROPONINI" in the last 168 hours. BNP (last 3 results) No results for input(s): "PROBNP" in the last 8760 hours. HbA1C: No results for input(s): "HGBA1C" in the last 72 hours. CBG: No results for input(s): "GLUCAP" in the last 168 hours. Lipid  Profile: No results for input(s): "CHOL", "HDL", "LDLCALC", "TRIG", "CHOLHDL", "LDLDIRECT" in the last 72 hours. Thyroid Function Tests: No results for input(s): "TSH", "T4TOTAL", "FREET4", "T3FREE", "THYROIDAB" in the last 72 hours. Anemia Panel: No results for input(s): "VITAMINB12", "FOLATE", "FERRITIN", "TIBC", "IRON", "RETICCTPCT" in the last 72 hours. Sepsis Labs: No results for input(s): "PROCALCITON", "LATICACIDVEN" in the last 168 hours.  Recent Results (from the past 240 hour(s))  MRSA Next Gen by PCR, Nasal     Status: None   Collection Time: 08/10/22  6:46 PM   Specimen: Nasal Mucosa; Nasal Swab  Result Value Ref Range Status   MRSA by PCR Next Gen NOT DETECTED NOT DETECTED Final    Comment: (NOTE) The GeneXpert MRSA Assay (FDA approved for NASAL specimens only), is one component of a comprehensive MRSA colonization surveillance program. It is not intended to diagnose MRSA infection nor to guide or monitor treatment for MRSA infections. Test performance is not FDA approved in patients less than 32 years old. Performed at [facility], [address] [place], [place] [address]          Radiology Studies: CT Head Wo Contrast  Result Date: 08/10/2022 CLINICAL DATA:  Fall EXAM: CT HEAD WITHOUT CONTRAST CT CERVICAL SPINE WITHOUT CONTRAST TECHNIQUE: Multidetector CT imaging of the head and cervical spine was performed following the standard protocol without intravenous contrast. Multiplanar CT image reconstructions of the cervical spine were also generated. RADIATION DOSE REDUCTION: This exam was performed according to the departmental dose-optimization program which includes automated exposure control, adjustment of the mA and/or kV according to patient size and/or use of iterative reconstruction technique. COMPARISON:  CT head and cervical spine 11/22/2019 FINDINGS: CT HEAD FINDINGS Brain: There is no acute intracranial hemorrhage, extra-axial fluid collection, or acute  infarct. Parenchymal volume is normal for age. The ventricles are normal in size. Gray-white differentiation is preserved. There is extensive confluent hypodensity throughout the supratentorial white matter likely reflecting sequela of advanced chronic small-vessel ischemic change. 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 The pituitary and suprasellar region are normal. There is no mass lesion. There is no mass effect or midline shift. Vascular: There is calcification of the bilateral carotid siphons. Skull: Normal. Negative for fracture or focal lesion. Sinuses/Orbits: There is mild mucosal thickening in the left maxillary sinus. The globes and orbits are unremarkable. Other: None. CT CERVICAL SPINE FINDINGS Alignment: Normal. Skull base and vertebrae: Skull base alignment is maintained. Vertebral body heights are preserved. There is no evidence of acute fracture. There is no suspicious osseous lesion. Soft tissues and spinal canal: No prevertebral fluid or swelling. No visible canal hematoma. Disc levels: There is overall mild degenerative change of the cervical spine without evidence of high-grade osseous spinal canal or neural foraminal stenosis. Upper chest: The imaged portions of the lung apices are clear. Other: None. IMPRESSION: 1. No acute intracranial pathology. 2. No acute fracture or traumatic malalignment of the cervical spine. Electronically Signed   By: Lesia Hausen M.D.   On: 08/10/2022 15:04   CT  Cervical Spine Wo Contrast  Result Date: 08/10/2022 CLINICAL DATA:  Fall EXAM: CT HEAD WITHOUT CONTRAST CT CERVICAL SPINE WITHOUT CONTRAST TECHNIQUE: Multidetector CT imaging of the head and cervical spine was performed following the standard protocol without intravenous contrast. Multiplanar CT image reconstructions of the cervical spine were also generated. RADIATION DOSE REDUCTION: This exam was performed according to the departmental dose-optimization  program which includes automated exposure control, adjustment of the mA and/or kV according to patient size and/or use of iterative reconstruction technique. COMPARISON:  CT head and cervical spine 11/22/2019 FINDINGS: CT HEAD FINDINGS Brain: There is no acute intracranial hemorrhage, extra-axial fluid collection, or acute infarct. Parenchymal volume is normal for age. The ventricles are normal in size. Gray-white differentiation is preserved. There is extensive confluent hypodensity throughout the supratentorial white matter likely reflecting sequela of advanced chronic small-vessel ischemic change. 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 The pituitary and suprasellar region are normal. There is no mass lesion. There is no mass effect or midline shift. Vascular: There is calcification of the bilateral carotid siphons. Skull: Normal. Negative for fracture or focal lesion. Sinuses/Orbits: There is mild mucosal thickening in the left maxillary sinus. The globes and orbits are unremarkable. Other: None. CT CERVICAL SPINE FINDINGS Alignment: Normal. Skull base and vertebrae: Skull base alignment is maintained. Vertebral body heights are preserved. There is no evidence of acute fracture. There is no suspicious osseous lesion. Soft tissues and spinal canal: No prevertebral fluid or swelling. No visible canal hematoma. Disc levels: There is overall mild degenerative change of the cervical spine without evidence of high-grade osseous spinal canal or neural foraminal stenosis. Upper chest: The imaged portions of the lung apices are clear. Other: None. IMPRESSION: 1. No acute intracranial pathology. 2. No acute fracture or traumatic malalignment of the cervical spine. Electronically Signed   By: Lesia Hausen M.D.   On: 08/10/2022 15:04   DG Chest 2 View  Result Date: 08/10/2022 CLINICAL DATA:  Shortness of breath. EXAM: CHEST - 2 VIEW COMPARISON:  X-ray 07/10/2022 FINDINGS:  Enlarged cardiopericardial silhouette with calcified aorta. Vascular congestion. Hyperinflation. No pneumothorax or effusion. Frontal view is rotated to the left. Lateral view is under penetrated with motion. Degenerative changes are seen of the spine. Overlapping cardiac leads. IMPRESSION: Limited x-rays. Enlarged heart with some vascular congestion. Electronically Signed   By: Karen Kays M.D.   On: 08/10/2022 14:57        Scheduled Meds:  amiodarone  200 mg Oral Daily   apixaban  5 mg Oral BID   atorvastatin  40 mg Oral Daily   Chlorhexidine Gluconate Cloth  6 each Topical Daily   gabapentin  600 mg Oral QID   midodrine  5 mg Oral BID WC   pantoprazole  40 mg Oral BID   pramipexole  0.5 mg Oral QHS   sertraline  200 mg Oral Daily     LOS: 1 day    Time spent: 35 minutes    Spiros Greenfeld Hoover Brunette, DO [facility]  If 7PM-7AM, please contact night-coverage [email or web address] 08/11/2022, 12:17 PM

## 2022-08-11 NOTE — Consult Note (Signed)
Cardiology Consultation:   Patient ID: Allison Rollins; [id number]; [birth year]   Admit date: 08/10/2022 Date of Consult: 08/11/2022  Primary Care Provider: Gabriel Earing, FNP Primary Cardiologist: Allison Rich, MD  History of Present Illness:   Allison Rollins is a 72 y.o. female currently admitted for evaluation after a fall at home.  Cardiology is consulted by Dr. Sherryll Burger for possible heart failure exacerbation.  I reviewed her chart and discussed symptoms with her.  Actually, she tells me that breathing status has not changed recently, NYHA class II with most activities.  She has had some leg swelling however.  She uses a walker to get around at home after going home from the [facility].  Mild lightheadedness when she stands up and known history of relative hypotension limiting GDMT from a cardiovascular perspective.  She states that she was walking with her walker from the hall into her bedroom, stopped to flip the light switch off thinking that she could make it to her bed, but in the dark she ran into her bed and fell to the floor.  At no point did she lose consciousness.  She states that she could not get up once she had fallen and EMS ultimately transported her for further evaluation. Head CT shows no acute findings.  She reports compliance with her medications at home.  Presently not on any antihypertensives.  She has maintained oral amiodarone and Eliquis, has Lasix available only as needed.  Recent cardiac workup reviewed including documentation of persistent atrial fibrillation, cardioversion deferred during her last hospital stay with plan for outpatient management.  Recent echocardiogram demonstrated LVEF 40 to 45% with global hypokinesis.  ROS:  Pertinent review in history of present illness.  Past Medical History:  Diagnosis Date   Anxiety    Asthma    Chest pain 12/22/2015   CHF (congestive heart failure)    COPD (chronic obstructive  pulmonary disease)    Depression    Headache    HTN (hypertension)    Hypokalemia 07/28/2013   Kidney stones    Osteopenia 01/22/2020   Parkinson's disease    Pneumonia    Pre-diabetes    S/P colonoscopy 10/15/04   normal   Vitamin D deficiency 11/24/2018    Past Surgical History:  Procedure Laterality Date   CHOLECYSTECTOMY     COLONOSCOPY  10/15/2004   Normal rectum/Normal colon   COLONOSCOPY N/A 05/10/2016   Procedure: COLONOSCOPY;  Surgeon: West Bali, MD;  [facility];  Service: Endoscopy;  Laterality: N/A;  10:30   ESOPHAGEAL MANOMETRY N/A 04/02/2019   Procedure: ESOPHAGEAL MANOMETRY (EM);  Surgeon: Napoleon Form, MD;  Location: [facility];  Service: Endoscopy;  Laterality: N/A;   ESOPHAGOGASTRODUODENOSCOPY  09/11/2010   Dysphagia likely multifactorial (possible Candida esophagitis, likely nonspecific esophageal motility disorder, and/or uncontrolled gastroesophageal reflux disease), status post empiric dilation   ESOPHAGOGASTRODUODENOSCOPY N/A 11/13/2018   Dr. Darrick Penna: No endoscopic esophageal abnormality to explain patient's dysphagia.  Esophagus dilated.  Moderate erosive/nodular gastritis with benign biopsies.   KIDNEY STONE SURGERY     SAVORY DILATION N/A 11/13/2018   Procedure: SAVORY DILATION;  Surgeon: West Bali, MD;  Location: [facility];  Service: Endoscopy;  Laterality: N/A;     Inpatient Medications: Scheduled Meds:  amiodarone  200 mg Oral Daily   apixaban  5 mg Oral BID   atorvastatin  40 mg Oral Daily   Chlorhexidine Gluconate Cloth  6 each Topical Daily   gabapentin  600  mg Oral QID   pantoprazole  40 mg Oral BID   pramipexole  0.5 mg Oral QHS   sertraline  200 mg Oral Daily   Continuous Infusions:  PRN Meds: acetaminophen **OR** acetaminophen, albuterol, ondansetron **OR** ondansetron (ZOFRAN) IV, polyethylene glycol  Allergies:    Allergies  Allergen Reactions   Egg Solids, Whole    Farxiga [Dapagliflozin] Swelling    Penicillins Itching and Rash    Has patient had a PCN reaction causing immediate rash, facial/tongue/throat swelling, SOB or lightheadedness with hypotension: no Has patient had a PCN reaction causing severe rash involving mucus membranes or skin necrosis: no Has patient had a PCN reaction that required hospitalization: no Has patient had a PCN reaction occurring within the last 10 years: no If all of the above answers are "NO", then may proceed with Cephalosporin use.    Sulfa Antibiotics Rash    Social History:   Social History   Tobacco Use   Smoking status: Former    Packs/day: 0.25    Years: 30.00    Additional pack years: 0.00    Total pack years: 7.50    Types: Cigarettes    Start date: 01/04/1965    Quit date: 01/03/1995    Years since quitting: 27.6    Passive exposure: Past   Smokeless tobacco: Never  Substance Use Topics   Alcohol use: No    Alcohol/week: 0.0 standard drinks of alcohol    Family History:   The patient's family history includes Asthma in her brother; COPD in her mother; Coronary artery disease in her brother, father, sister, and sister; Diabetes in her sister.  Physical Exam/Data:   Vitals:   08/11/22 0400 08/11/22 0700 08/11/22 0730 08/11/22 0800  BP:  101/80  95/76  Pulse:  92    Resp:  16  17  Temp: 98 F (36.7 C)  97.6 F (36.4 C)   TempSrc: Oral  Oral   SpO2:  98%  100%  Weight:      Height:        Intake/Output Summary (Last 24 hours) at 08/11/2022 0932 Last data filed at 08/11/2022 0800 Gross per 24 hour  Intake 360 ml  Output 100 ml  Net 260 ml   Filed Weights   08/10/22 1250 08/10/22 1854 08/11/22 0247  Weight: 101 kg 96.2 kg 96.2 kg   Body mass index is 34.23 kg/m.   Gen: Patient appears comfortable at rest. HEENT: Conjunctiva and lids normal. Neck: Supple, no elevated JVP or carotid bruits. Lungs: Clear to auscultation, nonlabored breathing at rest. Cardiac: Irregularly irregular with 1/6 stock murmur no  gallop. Abdomen: Soft, nontender, bowel sounds present. Extremities: 2+ lower leg edema, distal pulses 2+. Skin: Warm and dry.  Mild erythema lower legs, no weeping or ulcerations. Musculoskeletal: No kyphosis. Neuropsychiatric: Alert and oriented x3, affect grossly appropriate.  EKG:  An ECG dated 08/10/2022 was personally reviewed today and demonstrated:  Atrial fibrillation with RVR, decreased R wave progression, nonspecific ST changes.  Telemetry:  I personally reviewed telemetry which shows atrial fibrillation with heart rate 100-110.  Relevant CV Studies:  Echocardiogram 07/11/2022:  1. Cannot fully assess wall motion; windows are challenging. Left  ventricular ejection fraction, by estimation, is 40 to 45%. The left  ventricle has mildly decreased function. The left ventricle demonstrates  global hypokinesis. The left ventricular  internal cavity size was mildly dilated. There is mild concentric left  ventricular hypertrophy. Left ventricular diastolic parameters are  indeterminate.   2. Right  ventricular systolic function is mildly reduced. The right  ventricular size is not well visualized. Tricuspid regurgitation signal is  inadequate for assessing PA pressure.   3. Left atrial size was moderately dilated.   4. A small pericardial effusion is present.   5. The mitral valve is degenerative. No evidence of mitral valve  regurgitation.   6. Aortic valve regurgitation is not visualized.   7. The inferior vena cava is dilated in size with >50% respiratory  variability, suggesting right atrial pressure of 8 mmHg.   Laboratory Data:  Chemistry Recent Labs  Lab 08/10/22 1359 08/11/22 0518  NA 140 141  K 3.4* 4.0  CL 97* 98  CO2 32 34*  GLUCOSE 106* 121*  BUN 26* 21  CREATININE 1.02* 1.01*  CALCIUM 8.7* 8.7*  GFRNONAA 58* 59*  ANIONGAP 11 9    Recent Labs  Lab 08/10/22 1359  PROT 6.1*  ALBUMIN 3.0*  AST 29  ALT 17  ALKPHOS 74  BILITOT 0.9    Hematology Recent Labs  Lab 08/10/22 1359  WBC 7.8  RBC 4.69  HGB 13.9  HCT 44.0  MCV 93.8  MCH 29.6  MCHC 31.6  RDW 13.7  PLT 132*   Cardiac Enzymes Recent Labs  Lab 08/10/22 1359 08/10/22 1515  TROPONINIHS 11 9   BNP Recent Labs  Lab 08/10/22 1359  BNP 783.0*     Lipid Panel     Component Value Date/Time   CHOL 158 02/24/2022 1513   TRIG 203 (H) 02/24/2022 1513   HDL 39 (L) 02/24/2022 1513   CHOLHDL 4.1 02/24/2022 1513   CHOLHDL 6.9 09/19/2014 0830   VLDL 49 (H) 09/19/2014 0830   LDLCALC 85 02/24/2022 1513   LABVLDL 34 02/24/2022 1513    Radiology/Studies:  CT Head Wo Contrast  Result Date: 08/10/2022 CLINICAL DATA:  Fall EXAM: CT HEAD WITHOUT CONTRAST CT CERVICAL SPINE WITHOUT CONTRAST TECHNIQUE: Multidetector CT imaging of the head and cervical spine was performed following the standard protocol without intravenous contrast. Multiplanar CT image reconstructions of the cervical spine were also generated. RADIATION DOSE REDUCTION: This exam was performed according to the departmental dose-optimization program which includes automated exposure control, adjustment of the mA and/or kV according to patient size and/or use of iterative reconstruction technique. COMPARISON:  CT head and cervical spine 11/22/2019 FINDINGS: CT HEAD FINDINGS Brain: There is no acute intracranial hemorrhage, extra-axial fluid collection, or acute infarct. Parenchymal volume is normal for age. The ventricles are normal in size. Gray-white differentiation is preserved. There is extensive confluent hypodensity throughout the supratentorial white matter likely reflecting sequela of advanced chronic small-vessel ischemic change. 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 The pituitary and suprasellar region are normal. There is no mass lesion. There is no mass effect or midline shift. Vascular: There is calcification of the bilateral carotid siphons. Skull:  Normal. Negative for fracture or focal lesion. Sinuses/Orbits: There is mild mucosal thickening in the left maxillary sinus. The globes and orbits are unremarkable. Other: None. CT CERVICAL SPINE FINDINGS Alignment: Normal. Skull base and vertebrae: Skull base alignment is maintained. Vertebral body heights are preserved. There is no evidence of acute fracture. There is no suspicious osseous lesion. Soft tissues and spinal canal: No prevertebral fluid or swelling. No visible canal hematoma. Disc levels: There is overall mild degenerative change of the cervical spine without evidence of high-grade osseous spinal canal or neural foraminal stenosis. Upper chest: The imaged portions of the lung apices  are clear. Other: None. IMPRESSION: 1. No acute intracranial pathology. 2. No acute fracture or traumatic malalignment of the cervical spine. Electronically Signed   By: Lesia Hausen M.D.   On: 08/10/2022 15:04   CT Cervical Spine Wo Contrast  Result Date: 08/10/2022 CLINICAL DATA:  Fall EXAM: CT HEAD WITHOUT CONTRAST CT CERVICAL SPINE WITHOUT CONTRAST TECHNIQUE: Multidetector CT imaging of the head and cervical spine was performed following the standard protocol without intravenous contrast. Multiplanar CT image reconstructions of the cervical spine were also generated. RADIATION DOSE REDUCTION: This exam was performed according to the departmental dose-optimization program which includes automated exposure control, adjustment of the mA and/or kV according to patient size and/or use of iterative reconstruction technique. COMPARISON:  CT head and cervical spine 11/22/2019 FINDINGS: CT HEAD FINDINGS Brain: There is no acute intracranial hemorrhage, extra-axial fluid collection, or acute infarct. Parenchymal volume is normal for age. The ventricles are normal in size. Gray-white differentiation is preserved. There is extensive confluent hypodensity throughout the supratentorial white matter likely reflecting sequela of  advanced chronic small-vessel ischemic change. 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 The pituitary and suprasellar region are normal. There is no mass lesion. There is no mass effect or midline shift. Vascular: There is calcification of the bilateral carotid siphons. Skull: Normal. Negative for fracture or focal lesion. Sinuses/Orbits: There is mild mucosal thickening in the left maxillary sinus. The globes and orbits are unremarkable. Other: None. CT CERVICAL SPINE FINDINGS Alignment: Normal. Skull base and vertebrae: Skull base alignment is maintained. Vertebral body heights are preserved. There is no evidence of acute fracture. There is no suspicious osseous lesion. Soft tissues and spinal canal: No prevertebral fluid or swelling. No visible canal hematoma. Disc levels: There is overall mild degenerative change of the cervical spine without evidence of high-grade osseous spinal canal or neural foraminal stenosis. Upper chest: The imaged portions of the lung apices are clear. Other: None. IMPRESSION: 1. No acute intracranial pathology. 2. No acute fracture or traumatic malalignment of the cervical spine. Electronically Signed   By: Lesia Hausen M.D.   On: 08/10/2022 15:04   DG Chest 2 View  Result Date: 08/10/2022 CLINICAL DATA:  Shortness of breath. EXAM: CHEST - 2 VIEW COMPARISON:  X-ray 07/10/2022 FINDINGS: Enlarged cardiopericardial silhouette with calcified aorta. Vascular congestion. Hyperinflation. No pneumothorax or effusion. Frontal view is rotated to the left. Lateral view is under penetrated with motion. Degenerative changes are seen of the spine. Overlapping cardiac leads. IMPRESSION: Limited x-rays. Enlarged heart with some vascular congestion. Electronically Signed   By: Karen Kays M.D.   On: 08/10/2022 14:57    Assessment and Plan:   1.  Patient presents after mechanical fall at home, no syncope.  Does have relatively low blood pressure at  baseline and propensity to orthostasis, although not necessarily clear that this was a contributor.  She states that she was not dizzy at the time and simply ran into her bed in the dark.  Blood pressures do limit GDMT however.  2.  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  Not entirely clear that she presents with substantial decompensation in symptoms although GDMT is markedly limited and she does have some peripheral edema.  Chest x-ray reported vascular congestion.  Only using Lasix as needed at home.  3.  Persistent atrial fibrillation with CHA2DS2-VASc score of 6.  She is on oral amiodarone and Eliquis.  Cardioversion deferred during last hospital stay with plan  for further discussion as an outpatient.  She was hesitant to proceed.  4.  Chronic hypoxic respiratory failure with reported COPD, on home oxygen.  5.  Chart indicates diagnosis of Parkinson's disease.  If so, possible associated autonomic dysfunction and propensity to orthostasis.  Would continue oral amiodarone and Eliquis.  Suggest starting midodrine 5 mg twice daily and follow blood pressure trend.  GDMT is quite limited by low blood pressure.  Not sure if outpatient medication list is accurate (Lisinopril 10 mg daily was listed, but she states that this was already discontinued).  Looks like she was taking Toprol-XL at least 50 mg daily at home as well for heart rate control.  Would not resume beta-blocker as yet unless blood pressure increases on midodrine, in that case would start low-dose.  Would also not aggressively diurese.  For questions or updates, please contact [facility] Please consult [email or web address] for contact info under   Signed, Nona Dell, MD  08/11/2022 9:32 AM

## 2022-08-12 ENCOUNTER — Ambulatory Visit: Payer: Medicare HMO | Admitting: Cardiology

## 2022-08-12 DIAGNOSIS — I4821 Permanent atrial fibrillation: Secondary | ICD-10-CM | POA: Diagnosis not present

## 2022-08-12 DIAGNOSIS — I5022 Chronic systolic (congestive) heart failure: Secondary | ICD-10-CM | POA: Diagnosis not present

## 2022-08-12 DIAGNOSIS — I5043 Acute on chronic combined systolic (congestive) and diastolic (congestive) heart failure: Secondary | ICD-10-CM | POA: Diagnosis not present

## 2022-08-12 DIAGNOSIS — I951 Orthostatic hypotension: Secondary | ICD-10-CM | POA: Diagnosis not present

## 2022-08-12 LAB — URINALYSIS, W/ REFLEX TO CULTURE (INFECTION SUSPECTED)
Bilirubin Urine: NEGATIVE
Glucose, UA: NEGATIVE mg/dL
Hgb urine dipstick: NEGATIVE
Ketones, ur: NEGATIVE mg/dL
Nitrite: POSITIVE — AB
Protein, ur: 100 mg/dL — AB
Specific Gravity, Urine: 1.025 (ref 1.005–1.030)
pH: 8 (ref 5.0–8.0)

## 2022-08-12 LAB — CBC
HCT: 40.2 % (ref 36.0–46.0)
Hemoglobin: 12.4 g/dL (ref 12.0–15.0)
MCH: 29.2 pg (ref 26.0–34.0)
MCHC: 30.8 g/dL (ref 30.0–36.0)
MCV: 94.6 fL (ref 80.0–100.0)
Platelets: 153 10*3/uL (ref 150–400)
RBC: 4.25 MIL/uL (ref 3.87–5.11)
RDW: 13.9 % (ref 11.5–15.5)
WBC: 8.5 10*3/uL (ref 4.0–10.5)
nRBC: 0 % (ref 0.0–0.2)

## 2022-08-12 LAB — BASIC METABOLIC PANEL
Anion gap: 9 (ref 5–15)
BUN: 17 mg/dL (ref 8–23)
CO2: 32 mmol/L (ref 22–32)
Calcium: 8.5 mg/dL — ABNORMAL LOW (ref 8.9–10.3)
Chloride: 98 mmol/L (ref 98–111)
Creatinine, Ser: 1 mg/dL (ref 0.44–1.00)
GFR, Estimated: 60 mL/min — ABNORMAL LOW (ref >60)
Glucose, Bld: 121 mg/dL — ABNORMAL HIGH (ref 70–99)
Potassium: 3.7 mmol/L (ref 3.5–5.1)
Sodium: 139 mmol/L (ref 135–145)

## 2022-08-12 LAB — MAGNESIUM: Magnesium: 1.7 mg/dL (ref 1.7–2.4)

## 2022-08-12 MED ORDER — ORAL CARE MOUTH RINSE: 15.0000 mL | OROMUCOSAL | Status: DC | PRN

## 2022-08-12 MED ORDER — METOPROLOL TARTRATE 25 MG PO TABS
12.5000 mg | ORAL_TABLET | Freq: Two times a day (BID) | ORAL | Status: DC
  Administered 2022-08-12 – 2022-08-13 (×3): 12.5 mg via ORAL
  Filled 2022-08-12 (×3): qty 1

## 2022-08-12 MED ORDER — DM-GUAIFENESIN ER 30-600 MG PO TB12
1.0000 | ORAL_TABLET | Freq: Two times a day (BID) | ORAL | Status: DC
  Administered 2022-08-12 – 2022-08-17 (×11): 1 via ORAL
  Filled 2022-08-12 (×11): qty 1

## 2022-08-12 NOTE — Progress Notes (Deleted)
Clinical Summary Allison Rollins is a 72 y.o.female   Past Medical History:  Diagnosis Date   Anxiety    Asthma    Chest pain 12/22/2015   CHF (congestive heart failure)    COPD (chronic obstructive pulmonary disease)    Depression    Headache    HTN (hypertension)    Hypokalemia 07/28/2013   Kidney stones    Osteopenia 01/22/2020   Parkinson's disease    Pneumonia    Pre-diabetes    S/P colonoscopy 10/15/04   normal   Vitamin D deficiency 11/24/2018     Allergies  Allergen Reactions   Egg Solids, Whole    Farxiga [Dapagliflozin] Swelling   Penicillins Itching and Rash    Has patient had a PCN reaction causing immediate rash, facial/tongue/throat swelling, SOB or lightheadedness with hypotension: no Has patient had a PCN reaction causing severe rash involving mucus membranes or skin necrosis: no Has patient had a PCN reaction that required hospitalization: no Has patient had a PCN reaction occurring within the last 10 years: no If all of the above answers are "NO", then may proceed with Cephalosporin use.    Sulfa Antibiotics Rash     No current facility-administered medications for this visit.   No current outpatient medications on file.   Facility-Administered Medications Ordered in Other Visits  Medication Dose Route Frequency Provider Last Rate Last Admin   acetaminophen (TYLENOL) tablet 650 mg  650 mg Oral Q6H PRN Emokpae, Ejiroghene E, MD       Or   acetaminophen (TYLENOL) suppository 650 mg  650 mg Rectal Q6H PRN Emokpae, Ejiroghene E, MD       albuterol (PROVENTIL) (2.5 MG/3ML) 0.083% nebulizer solution 2.5 mg  2.5 mg Nebulization Q6H PRN Emokpae, Ejiroghene E, MD       amiodarone (PACERONE) tablet 200 mg  200 mg Oral Daily Emokpae, Ejiroghene E, MD   200 mg at 08/11/22 0911   apixaban (ELIQUIS) tablet 5 mg  5 mg Oral BID Emokpae, Ejiroghene E, MD   5 mg at 08/11/22 2119   atorvastatin (LIPITOR) tablet 40 mg  40 mg Oral Daily Emokpae, Ejiroghene E, MD   40  mg at 08/11/22 0910   cefTRIAXone (ROCEPHIN) 1 g in sodium chloride 0.9 % 100 mL IVPB  1 g Intravenous Q24H Adefeso, Oladapo, DO   Stopped at 08/11/22 2156   Chlorhexidine Gluconate Cloth 2 % PADS 6 each  6 each Topical Daily Zierle-Ghosh, Asia B, DO   6 each at 08/11/22 0911   gabapentin (NEURONTIN) capsule 600 mg  600 mg Oral QID Emokpae, Ejiroghene E, MD   600 mg at 08/11/22 2120   midodrine (PROAMATINE) tablet 5 mg  5 mg Oral BID WC Jonelle Sidle, MD   5 mg at 08/12/22 0738   ondansetron (ZOFRAN) tablet 4 mg  4 mg Oral Q6H PRN Emokpae, Ejiroghene E, MD       Or   ondansetron (ZOFRAN) injection 4 mg  4 mg Intravenous Q6H PRN Emokpae, Ejiroghene E, MD       Oral care mouth rinse  15 mL Mouth Rinse PRN Sherryll Burger, Pratik D, DO       pantoprazole (PROTONIX) EC tablet 40 mg  40 mg Oral BID Emokpae, Ejiroghene E, MD   40 mg at 08/11/22 2119   polyethylene glycol (MIRALAX / GLYCOLAX) packet 17 g  17 g Oral Daily PRN Emokpae, Ejiroghene E, MD   17 g at 08/11/22 2158  pramipexole (MIRAPEX) tablet 0.5 mg  0.5 mg Oral QHS Emokpae, Ejiroghene E, MD   0.5 mg at 08/11/22 2118   sertraline (ZOLOFT) tablet 200 mg  200 mg Oral Daily Emokpae, Ejiroghene E, MD   200 mg at 08/11/22 0910   sodium chloride (OCEAN) 0.65 % nasal spray 1 spray  1 spray Each Nare PRN Maurilio Lovely D, DO         Past Surgical History:  Procedure Laterality Date   CHOLECYSTECTOMY     COLONOSCOPY  10/15/2004   Normal rectum/Normal colon   COLONOSCOPY N/A 05/10/2016   Procedure: COLONOSCOPY;  Surgeon: West Bali, MD;  [facility];  Service: Endoscopy;  Laterality: N/A;  10:30   ESOPHAGEAL MANOMETRY N/A 04/02/2019   Procedure: ESOPHAGEAL MANOMETRY (EM);  Surgeon: Napoleon Form, MD;  Location: [facility];  Service: Endoscopy;  Laterality: N/A;   ESOPHAGOGASTRODUODENOSCOPY  09/11/2010   Dysphagia likely multifactorial (possible Candida esophagitis, likely nonspecific esophageal motility disorder, and/or  uncontrolled gastroesophageal reflux disease), status post empiric dilation   ESOPHAGOGASTRODUODENOSCOPY N/A 11/13/2018   Dr. Darrick Penna: No endoscopic esophageal abnormality to explain patient's dysphagia.  Esophagus dilated.  Moderate erosive/nodular gastritis with benign biopsies.   KIDNEY STONE SURGERY     SAVORY DILATION N/A 11/13/2018   Procedure: SAVORY DILATION;  Surgeon: West Bali, MD;  [facility]: [facility];  Service: Endoscopy;  Laterality: N/A;     Allergies  Allergen Reactions   Egg Solids, Whole    Farxiga [Dapagliflozin] Swelling   Penicillins Itching and Rash    Has patient had a PCN reaction causing immediate rash, facial/tongue/throat swelling, SOB or lightheadedness with hypotension: no Has patient had a PCN reaction causing severe rash involving mucus membranes or skin necrosis: no Has patient had a PCN reaction that required hospitalization: no Has patient had a PCN reaction occurring within the last 10 years: no If all of the above answers are "NO", then may proceed with Cephalosporin use.    Sulfa Antibiotics Rash      Family History  Problem Relation Age of Onset   Coronary artery disease Father    COPD Mother    Asthma Brother    Coronary artery disease Sister    Diabetes Sister    Coronary artery disease Brother    Coronary artery disease Sister      Social History Allison Rollins reports that she quit smoking about 27 years ago. Her smoking use included cigarettes. She started smoking about 57 years ago. She has a 7.50 pack-year smoking history. She has been exposed to tobacco smoke. She has never used smokeless tobacco. Allison Rollins reports no history of alcohol use.   Review of Systems CONSTITUTIONAL: No weight loss, fever, chills, weakness or fatigue.  HEENT: Eyes: No visual loss, blurred vision, double vision or yellow sclerae.No hearing loss, sneezing, congestion, runny nose or sore throat.  SKIN: No rash or itching.  CARDIOVASCULAR:   RESPIRATORY: No shortness of breath, cough or sputum.  GASTROINTESTINAL: No anorexia, nausea, vomiting or diarrhea. No abdominal pain or blood.  GENITOURINARY: No burning on urination, no polyuria NEUROLOGICAL: No headache, dizziness, syncope, paralysis, ataxia, numbness or tingling in the extremities. No change in bowel or bladder control.  MUSCULOSKELETAL: No muscle, back pain, joint pain or stiffness.  LYMPHATICS: No enlarged nodes. No history of splenectomy.  PSYCHIATRIC: No history of depression or anxiety.  ENDOCRINOLOGIC: No reports of sweating, cold or heat intolerance. No polyuria or polydipsia.  Marland Kitchen   Physical Examination  There were no vitals filed for this visit. There were no vitals filed for this visit.  Gen: resting comfortably, no acute distress HEENT: no scleral icterus, pupils equal round and reactive, no palptable cervical adenopathy,  CV Resp: Clear to auscultation bilaterally GI: abdomen is soft, non-tender, non-distended, normal bowel sounds, no hepatosplenomegaly MSK: extremities are warm, no edema.  Skin: warm, no rash Neuro:  no focal deficits Psych: appropriate affect   Diagnostic Studies     Assessment and Plan        Arnoldo Lenis, M.D., F.A.C.C.

## 2022-08-12 NOTE — Progress Notes (Signed)
[name]  ZOX:[id number] DOB: [birth year] DOA: 08/10/2022 PCP: Gabriel Earing, FNP   Brief Narrative:    Allison Rollins is a 72 y.o. female with medical history significant for congestive heart failure, atrial fibrillation, COPD on chronic 3 L, hypertension. Patient presented to ED with complaints of fall last night.  She was admitted with concern for acute on chronic combined systolic and diastolic CHF exacerbation, but was noted to be hypotensive and is likely having some autonomic dysfunction in the setting of her Parkinson's disease with recurrent orthostasis.  She was started on midodrine twice daily on 4/17 and then on metoprolol starting 4/18 per cardiology.  She continues to have some soft blood pressure readings and elevated heart rates.  Additionally, she has developed UTI and has been started on Rocephin on 4/17 with urine cultures pending.  Assessment & Plan:   Principal Problem:   Acute on chronic combined systolic and diastolic CHF (congestive heart failure) Active Problems:   Atrial fibrillation   Essential hypertension   Chronic obstructive pulmonary disease   Chronic respiratory failure with hypoxia  Assessment and Plan:  Mechanical fall at home with some associated hypotension -No syncopal episode noted -Agree with cardiology recommendations for midodrine twice daily, plan will be to increase dosing if blood pressures do not improve -PT evaluation -Orthostatics  UTI -Signs of UTI noted on UA 4/17 and started on Rocephin for total 3-day course -Urine culture ordered and pending  HFmrEF -Noted to have LVEF 40-45% recently -No need for diuresis at this time -Appreciate ongoing cardiology evaluation  Persistent atrial fibrillation -Continue amiodarone and Eliquis -Metoprolol 12.5 mg twice daily per cardiology started 4/18  Chronic hypoxemic respiratory failure -Continue 3 L nasal cannula oxygen  Obesity -BMI 34.23  DVT  prophylaxis:Eliquis Code Status: Full Family Communication: None at bedside Disposition Plan:  Status is: Inpatient Remains inpatient appropriate because: Need for IV medications.  Consultants:  Cardiology  Procedures:  None  Antimicrobials:  Anti-infectives (From admission, onward)    Start     Dose/Rate Route Frequency Ordered Stop   08/11/22 2130  cefTRIAXone (ROCEPHIN) 1 g in sodium chloride 0.9 % 100 mL IVPB        1 g 200 mL/hr over 30 Minutes Intravenous Every 24 hours 08/11/22 2033 08/14/22 2129       Subjective: Patient seen and evaluated today with ongoing elevated heart rates, but no palpitations, shortness of breath, or chest pain noted.  She was noted to have some dysuria overnight and has evidence for UTI and was started on Rocephin.  Objective: Vitals:   08/12/22 0600 08/12/22 0700 08/12/22 0800 08/12/22 0900  BP: 96/68 97/74 (!) 109/91 95/69  Pulse: (!) 117 (!) 117 (!) 101 (!) 128  Resp: 17 16 20 19   Temp:      TempSrc:      SpO2: 100% 98% 92% 100%  Weight:      Height:        Intake/Output Summary (Last 24 hours) at 08/12/2022 0913 Last data filed at 08/12/2022 0900 Gross per 24 hour  Intake 817.88 ml  Output 1000 ml  Net -182.12 ml   Filed Weights   08/10/22 1854 08/11/22 0247 08/12/22 0500  Weight: 96.2 kg 96.2 kg 96 kg    Examination:  General exam: Appears calm and comfortable  Respiratory system: Clear to auscultation. Respiratory effort normal.  3 L nasal cannula Cardiovascular system: S1 & S2 heard, irregular and tachycardic. Gastrointestinal system:  Abdomen is soft Central nervous system: Alert and awake Extremities: No edema Skin: No significant lesions noted Psychiatry: Flat affect.    Data Reviewed: I have personally reviewed following labs and imaging studies  CBC: Recent Labs  Lab 08/10/22 1359 08/12/22 0414  WBC 7.8 8.5  NEUTROABS 6.3  --   HGB 13.9 12.4  HCT 44.0 40.2  MCV 93.8 94.6  PLT 132* 153   Basic  Metabolic Panel: Recent Labs  Lab 08/10/22 1359 08/11/22 0518 08/12/22 0414  NA 140 141 139  K 3.4* 4.0 3.7  CL 97* 98 98  CO2 32 34* 32  GLUCOSE 106* 121* 121*  BUN 26* 21 17  CREATININE 1.02* 1.01* 1.00  CALCIUM 8.7* 8.7* 8.5*  MG  --   --  1.7   GFR: Estimated Creatinine Clearance: 59.4 mL/min (by C-G formula based on SCr of 1 mg/dL). Liver Function Tests: Recent Labs  Lab 08/10/22 1359  AST 29  ALT 17  ALKPHOS 74  BILITOT 0.9  PROT 6.1*  ALBUMIN 3.0*   No results for input(s): "LIPASE", "AMYLASE" in the last 168 hours. No results for input(s): "AMMONIA" in the last 168 hours. Coagulation Profile: No results for input(s): "INR", "PROTIME" in the last 168 hours. Cardiac Enzymes: No results for input(s): "CKTOTAL", "CKMB", "CKMBINDEX", "TROPONINI" in the last 168 hours. BNP (last 3 results) No results for input(s): "PROBNP" in the last 8760 hours. HbA1C: No results for input(s): "HGBA1C" in the last 72 hours. CBG: No results for input(s): "GLUCAP" in the last 168 hours. Lipid Profile: No results for input(s): "CHOL", "HDL", "LDLCALC", "TRIG", "CHOLHDL", "LDLDIRECT" in the last 72 hours. Thyroid Function Tests: No results for input(s): "TSH", "T4TOTAL", "FREET4", "T3FREE", "THYROIDAB" in the last 72 hours. Anemia Panel: No results for input(s): "VITAMINB12", "FOLATE", "FERRITIN", "TIBC", "IRON", "RETICCTPCT" in the last 72 hours. Sepsis Labs: No results for input(s): "PROCALCITON", "LATICACIDVEN" in the last 168 hours.  Recent Results (from the past 240 hour(s))  MRSA Next Gen by PCR, Nasal     Status: None   Collection Time: 08/10/22  6:46 PM   Specimen: Nasal Mucosa; Nasal Swab  Result Value Ref Range Status   MRSA by PCR Next Gen NOT DETECTED NOT DETECTED Final    Comment: (NOTE) The GeneXpert MRSA Assay (FDA approved for NASAL specimens only), is one component of a comprehensive MRSA colonization surveillance program. It is not intended to diagnose MRSA  infection nor to guide or monitor treatment for MRSA infections. Test performance is not FDA approved in patients less than 15 years old. Performed at [facility], [address] [place], [place] [address]          Radiology Studies: CT Head Wo Contrast  Result Date: 08/10/2022 CLINICAL DATA:  Fall EXAM: CT HEAD WITHOUT CONTRAST CT CERVICAL SPINE WITHOUT CONTRAST TECHNIQUE: Multidetector CT imaging of the head and cervical spine was performed following the standard protocol without intravenous contrast. Multiplanar CT image reconstructions of the cervical spine were also generated. RADIATION DOSE REDUCTION: This exam was performed according to the departmental dose-optimization program which includes automated exposure control, adjustment of the mA and/or kV according to patient size and/or use of iterative reconstruction technique. COMPARISON:  CT head and cervical spine 11/22/2019 FINDINGS: CT HEAD FINDINGS Brain: There is no acute intracranial hemorrhage, extra-axial fluid collection, or acute infarct. Parenchymal volume is normal for age. The ventricles are normal in size. Gray-white differentiation is preserved. There is extensive confluent hypodensity throughout the supratentorial white matter likely  reflecting sequela of advanced chronic small-vessel ischemic change. 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 The pituitary and suprasellar region are normal. There is no mass lesion. There is no mass effect or midline shift. Vascular: There is calcification of the bilateral carotid siphons. Skull: Normal. Negative for fracture or focal lesion. Sinuses/Orbits: There is mild mucosal thickening in the left maxillary sinus. The globes and orbits are unremarkable. Other: None. CT CERVICAL SPINE FINDINGS Alignment: Normal. Skull base and vertebrae: Skull base alignment is maintained. Vertebral body heights are preserved. There is no evidence of acute fracture.  There is no suspicious osseous lesion. Soft tissues and spinal canal: No prevertebral fluid or swelling. No visible canal hematoma. Disc levels: There is overall mild degenerative change of the cervical spine without evidence of high-grade osseous spinal canal or neural foraminal stenosis. Upper chest: The imaged portions of the lung apices are clear. Other: None. IMPRESSION: 1. No acute intracranial pathology. 2. No acute fracture or traumatic malalignment of the cervical spine. Electronically Signed   By: Lesia Hausen M.D.   On: 08/10/2022 15:04   CT Cervical Spine Wo Contrast  Result Date: 08/10/2022 CLINICAL DATA:  Fall EXAM: CT HEAD WITHOUT CONTRAST CT CERVICAL SPINE WITHOUT CONTRAST TECHNIQUE: Multidetector CT imaging of the head and cervical spine was performed following the standard protocol without intravenous contrast. Multiplanar CT image reconstructions of the cervical spine were also generated. RADIATION DOSE REDUCTION: This exam was performed according to the departmental dose-optimization program which includes automated exposure control, adjustment of the mA and/or kV according to patient size and/or use of iterative reconstruction technique. COMPARISON:  CT head and cervical spine 11/22/2019 FINDINGS: CT HEAD FINDINGS Brain: There is no acute intracranial hemorrhage, extra-axial fluid collection, or acute infarct. Parenchymal volume is normal for age. The ventricles are normal in size. Gray-white differentiation is preserved. There is extensive confluent hypodensity throughout the supratentorial white matter likely reflecting sequela of advanced chronic small-vessel ischemic change. 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 The pituitary and suprasellar region are normal. There is no mass lesion. There is no mass effect or midline shift. Vascular: There is calcification of the bilateral carotid siphons. Skull: Normal. Negative for fracture or focal  lesion. Sinuses/Orbits: There is mild mucosal thickening in the left maxillary sinus. The globes and orbits are unremarkable. Other: None. CT CERVICAL SPINE FINDINGS Alignment: Normal. Skull base and vertebrae: Skull base alignment is maintained. Vertebral body heights are preserved. There is no evidence of acute fracture. There is no suspicious osseous lesion. Soft tissues and spinal canal: No prevertebral fluid or swelling. No visible canal hematoma. Disc levels: There is overall mild degenerative change of the cervical spine without evidence of high-grade osseous spinal canal or neural foraminal stenosis. Upper chest: The imaged portions of the lung apices are clear. Other: None. IMPRESSION: 1. No acute intracranial pathology. 2. No acute fracture or traumatic malalignment of the cervical spine. Electronically Signed   By: Lesia Hausen M.D.   On: 08/10/2022 15:04   DG Chest 2 View  Result Date: 08/10/2022 CLINICAL DATA:  Shortness of breath. EXAM: CHEST - 2 VIEW COMPARISON:  X-ray 07/10/2022 FINDINGS: Enlarged cardiopericardial silhouette with calcified aorta. Vascular congestion. Hyperinflation. No pneumothorax or effusion. Frontal view is rotated to the left. Lateral view is under penetrated with motion. Degenerative changes are seen of the spine. Overlapping cardiac leads. IMPRESSION: Limited x-rays. Enlarged heart with some vascular congestion. Electronically Signed  By: Karen Kays M.D.   On: 08/10/2022 14:57        Scheduled Meds:  amiodarone  200 mg Oral Daily   apixaban  5 mg Oral BID   atorvastatin  40 mg Oral Daily   Chlorhexidine Gluconate Cloth  6 each Topical Daily   gabapentin  600 mg Oral QID   metoprolol tartrate  12.5 mg Oral BID   midodrine  5 mg Oral BID WC   pantoprazole  40 mg Oral BID   pramipexole  0.5 mg Oral QHS   sertraline  200 mg Oral Daily     LOS: 2 days    Time spent: 35 minutes    Jakwan Sally Hoover Brunette, DO [facility]  If 7PM-7AM, please contact  night-coverage [email or web address] 08/12/2022, 9:13 AM

## 2022-08-12 NOTE — Progress Notes (Signed)
Progress Note  Patient Name: Allison Rollins Date of Encounter: 08/12/2022  Primary Cardiologist: Dina Rich, MD  Interval Summary   Chart reviewed.  Patient reports no chest pain or palpitations, no breathlessness at rest.  Does have positive UA and started on antibiotics by hospitalist, culture pending.  She has not been up yet.  Midodrine was started yesterday, blood pressure trend somewhat better although still not optimal.  Vital Signs    Vitals:   08/12/22 0300 08/12/22 0400 08/12/22 0500 08/12/22 0600  BP: (!) 95/52 98/70  96/68  Pulse: 98 (!) 112  (!) 117  Resp: Temp:  97.9 F (36.6 C)    TempSrc:  Oral    SpO2: 91% 92%  100%  Weight:   96 kg   Height:        Intake/Output Summary (Last 24 hours) at 08/12/2022 0828 Last data filed at 08/12/2022 0510 Gross per 24 hour  Intake 457.88 ml  Output 1000 ml  Net -542.12 ml   Filed Weights   08/10/22 1854 08/11/22 0247 08/12/22 0500  Weight: 96.2 kg 96.2 kg 96 kg    Physical Exam   GEN: No acute distress.   Neck: No JVD. Cardiac: Irregularly irregular, 1/6 systolic murmur.  Respiratory: Nonlabored. Clear to auscultation bilaterally. GI: Soft, nontender, bowel sounds present. MS: 1-2+ lower leg edema and erythema as before. Neuro:  Nonfocal. Psych: Alert and oriented x 3. Normal affect.  ECG/Telemetry    Telemetry reviewed and shows atrial fibrillation with RVR.  Labs    Chemistry Recent Labs  Lab 08/10/22 1359 08/11/22 0518 08/12/22 0414  NA 140 141 139  K 3.4* 4.0 3.7  CL 97* 98 98  CO2 32 34* 32  GLUCOSE 106* 121* 121*  BUN 26* 21 17  CREATININE 1.02* 1.01* 1.00  CALCIUM 8.7* 8.7* 8.5*  PROT 6.1*  --   --   ALBUMIN 3.0*  --   --   AST 29  --   --   ALT 17  --   --   ALKPHOS 74  --   --   BILITOT 0.9  --   --   GFRNONAA 58* 59* 60*  ANIONGAP Hematology Recent Labs  Lab 08/10/22 1359 08/12/22 0414  WBC 7.8 8.5  RBC 4.69 4.25  HGB 13.9 12.4  HCT 44.0 40.2   MCV 93.8 94.6  MCH 29.6 29.2  MCHC 31.6 30.8  RDW 13.7 13.9  PLT 132* 153   Cardiac Enzymes Recent Labs  Lab 08/10/22 1359 08/10/22 1515  TROPONINIHS 11 9   Lipid Panel     Component Value Date/Time   CHOL 158 02/24/2022 1513   TRIG 203 (H) 02/24/2022 1513   HDL 39 (L) 02/24/2022 1513   CHOLHDL 4.1 02/24/2022 1513   CHOLHDL 6.9 09/19/2014 0830   VLDL 49 (H) 09/19/2014 0830   LDLCALC 85 02/24/2022 1513   LABVLDL 34 02/24/2022 1513    Cardiac Studies   Echocardiogram 07/11/2022:  1. Cannot fully assess wall motion; windows are challenging. Left  ventricular ejection fraction, by estimation, is 40 to 45%. The left  ventricle has mildly decreased function. The left ventricle demonstrates  global hypokinesis. The left ventricular  internal cavity size was mildly dilated. There is mild concentric left  ventricular hypertrophy. Left ventricular diastolic parameters are  indeterminate.   2. Right ventricular systolic function is mildly reduced. The right  ventricular size is not well  visualized. Tricuspid regurgitation signal is  inadequate for assessing PA pressure.   3. Left atrial size was moderately dilated.   4. A small pericardial effusion is present.   5. The mitral valve is degenerative. No evidence of mitral valve  regurgitation.   6. Aortic valve regurgitation is not visualized.   7. The inferior vena cava is dilated in size with >50% respiratory  variability, suggesting right atrial pressure of 8 mmHg.   Assessment & Plan   1.  Relative hypotension and orthostasis, possibly contributed to by autonomic dysfunction in the setting of Parkinson's disease.  Midodrine was started yesterday at 5 mg twice daily, may need to uptitrate eventually although her blood pressure trend is somewhat better.  2.  Persistent atrial fibrillation with CHA2DS2-VASc score of 6.  She is on amiodarone and Eliquis, cardioversion deferred during her last hospital stay with further  outpatient discussion plan per Dr. Wyline Mood.  She was hesitant to proceed previously.  Heart rate is not adequately controlled at this time, Toprol-XL has been held in light of problem #1.  3.  HFmrEF with LVEF 40 to 45% by recent echocardiogram in March.  She does have peripheral edema and vascular congestion by chest x-ray, was using Lasix on an as-needed basis at home.  Have not diuresed aggressively in light of problem #1.  Plan to initiate Lopressor 12.5 mg twice daily, continue midodrine 5 mg twice daily but would increase in the next 24 hours if blood pressure trend does not continue to improve.  Hold off on diuresis for now.  Can probably move her out to the floor and have PT evaluation.  Would check orthostatics as well.  For questions or updates, please contact [facility] Please consult [email or web address] for contact info under   Signed, Nona Dell, MD  08/12/2022, 8:28 AM

## 2022-08-12 NOTE — Progress Notes (Signed)
   08/12/22 1639  Spiritual Encounters  Type of Visit Initial  Care provided to: Patient  Conversation partners present during encounter Nurse  Referral source Patient request;Nurse (RN/NT/LPN)  Reason for visit Routine spiritual support  OnCall Visit No  Spiritual Framework  Presenting Themes Meaning/purpose/sources of inspiration;Courage hope and growth;Significant life change  Community/Connection Family;Faith community;Friend(s)  Patient Stress Factors Major life changes;Loss  Family Stress Factors None identified  Interventions  Spiritual Care Interventions Made Narrative/life review;Meaning making;Prayer  Intervention Outcomes  Outcomes Connection to spiritual care;Awareness around self/spiritual resourses;Connection to values and goals of care;Awareness of support  Spiritual Care Plan  Spiritual Care Issues Still Outstanding Chaplain will continue to follow   Received consult from RN that patient requests prayer. Found patient sitting up in recliner today and on the phone with her son. She shared how difficult the last few weeks have been and how difficult it has been to care for herself at home. She does have an attentive son who lives nearby and a niece. Her other son lives out of state. Provided space for her to reflect on her illness, upbringing, spiritual heritage, and relationships today. She has a firm faith in God rooted in Christianity and this serves her well. Chaplain provided prayer at close of visit and she asked Chaplain to contact her minister. Will continue to follow in order to provide spiritual support and to assess for spiritual need.   Rev. Jolyn Lent, M.Div. Chaplain

## 2022-08-13 DIAGNOSIS — I4891 Unspecified atrial fibrillation: Secondary | ICD-10-CM

## 2022-08-13 DIAGNOSIS — I959 Hypotension, unspecified: Secondary | ICD-10-CM

## 2022-08-13 DIAGNOSIS — I5043 Acute on chronic combined systolic (congestive) and diastolic (congestive) heart failure: Secondary | ICD-10-CM | POA: Diagnosis not present

## 2022-08-13 DIAGNOSIS — I480 Paroxysmal atrial fibrillation: Secondary | ICD-10-CM | POA: Diagnosis not present

## 2022-08-13 LAB — BASIC METABOLIC PANEL
Anion gap: 8 (ref 5–15)
BUN: 18 mg/dL (ref 8–23)
CO2: 33 mmol/L — ABNORMAL HIGH (ref 22–32)
Calcium: 8.4 mg/dL — ABNORMAL LOW (ref 8.9–10.3)
Chloride: 97 mmol/L — ABNORMAL LOW (ref 98–111)
Creatinine, Ser: 1.02 mg/dL — ABNORMAL HIGH (ref 0.44–1.00)
GFR, Estimated: 58 mL/min — ABNORMAL LOW (ref >60)
Glucose, Bld: 125 mg/dL — ABNORMAL HIGH (ref 70–99)
Potassium: 3.8 mmol/L (ref 3.5–5.1)
Sodium: 138 mmol/L (ref 135–145)

## 2022-08-13 LAB — MAGNESIUM: Magnesium: 1.7 mg/dL (ref 1.7–2.4)

## 2022-08-13 LAB — URINE CULTURE

## 2022-08-13 MED ORDER — MIDODRINE HCL 5 MG PO TABS
10.0000 mg | ORAL_TABLET | Freq: Two times a day (BID) | ORAL | Status: DC
  Administered 2022-08-13 – 2022-08-15 (×4): 10 mg via ORAL
  Filled 2022-08-13 (×4): qty 2

## 2022-08-13 MED ORDER — METOPROLOL TARTRATE 25 MG PO TABS
25.0000 mg | ORAL_TABLET | Freq: Two times a day (BID) | ORAL | Status: DC
  Administered 2022-08-13 – 2022-08-16 (×6): 25 mg via ORAL
  Filled 2022-08-13 (×6): qty 1

## 2022-08-13 MED ORDER — MIDODRINE HCL 5 MG PO TABS
5.0000 mg | ORAL_TABLET | Freq: Once | ORAL | Status: AC
  Administered 2022-08-13: 5 mg via ORAL
  Filled 2022-08-13: qty 1

## 2022-08-13 MED ORDER — METOPROLOL TARTRATE 25 MG PO TABS
12.5000 mg | ORAL_TABLET | Freq: Once | ORAL | Status: AC
  Administered 2022-08-13: 12.5 mg via ORAL
  Filled 2022-08-13: qty 1

## 2022-08-13 NOTE — Plan of Care (Signed)
  Problem: Acute Rehab PT Goals(only PT should resolve) Goal: Pt Will Go Supine/Side To Sit Outcome: Progressing Flowsheets (Taken 08/13/2022 1427) Pt will go Supine/Side to Sit: with modified independence Goal: Patient Will Transfer Sit To/From Stand Outcome: Progressing Flowsheets (Taken 08/13/2022 1427) Patient will transfer sit to/from stand: with modified independence Goal: Pt Will Transfer Bed To Chair/Chair To Bed Outcome: Progressing Flowsheets (Taken 08/13/2022 1427) Pt will Transfer Bed to Chair/Chair to Bed:  with modified independence  with supervision Goal: Pt Will Ambulate Outcome: Progressing Flowsheets (Taken 08/13/2022 1427) Pt will Ambulate:  75 feet  with supervision  with rolling walker   2:27 PM, 08/13/22 Ocie Bob, MPT Physical Therapist with [facility] [phone] office [phone] mobile phone

## 2022-08-13 NOTE — Progress Notes (Signed)
[facility]  PROGRESS NOTE  Allison Rollins ZOX:[id number] DOB: 1950/10/16 DOA: 08/10/2022 PCP: Gabriel Earing, FNP   Brief HPI:   72 y.o. female with medical history significant for congestive heart failure, atrial fibrillation, COPD on chronic 3 L, hypertension. Came to ED with complaints of fall last night. She was admitted with concern for acute on chronic combined systolic and diastolic CHF exacerbation, but was noted to be hypotensive and is likely having some autonomic dysfunction in the setting of her Parkinson's disease with recurrent orthostasis she was started on midodrine twice daily, metoprolol per cardiology.  Also noted UTI, started on Rocephin, urine culture obtained.    Subjective   Patient seen and examined, denies shortness of breath.   Assessment/Plan:    Mechanical fall associated with orthostatic hypotension -No history of syncope -Likely due to autonomic dysfunction in setting of Parkinson disease  -Started on midodrine per cardiology; currently on 5 mg midodrine twice daily -Check orthostatics every 8 hours -PT evaluation obtained  UTI -Found to have abnormal UA with positive nitrite and leukocytes -Urine culture obtained, result is pending -Started on Rocephin till 4/20 -Continues to be afebrile with normal WBC  Combined systolic and diastolic heart failure -EF 40 to 45% -Not on diuretics at home -Chest x-ray showed pulmonary edema but no diuresis in the hospital due to hypotension as above -Currently requiring 3 L pulmonary of oxygen via nasal cannula  Persistent atrial fibrillation -Heart rate controlled -Continue amiodarone, metoprolol -Continue Eliquis for anticoagulation  Obesity -BMI 34.23      Medications     amiodarone  200 mg Oral Daily   apixaban  5 mg Oral BID   atorvastatin  40 mg Oral Daily   Chlorhexidine Gluconate Cloth  6 each Topical Daily   dextromethorphan-guaiFENesin  1 tablet Oral BID   gabapentin  600 mg  Oral QID   metoprolol tartrate  12.5 mg Oral BID   midodrine  5 mg Oral BID WC   pantoprazole  40 mg Oral BID   pramipexole  0.5 mg Oral QHS   sertraline  200 mg Oral Daily     Data Reviewed:   CBG:  No results for input(s): "GLUCAP" in the last 168 hours.  SpO2: 99 % O2 Flow Rate (L/min): 3 L/min    Vitals:   08/13/22 0100 08/13/22 0400 08/13/22 0500 08/13/22 0601  BP: 102/69   (!) 92/59  Pulse:    (!) 107  Resp: 15   19  Temp:  97.9 F (36.6 C)    TempSrc:  Oral    SpO2:  97%  99%  Weight:   97.8 kg   Height:          Data Reviewed:  Basic Metabolic Panel: Recent Labs  Lab 08/10/22 1359 08/11/22 0518 08/12/22 0414  NA 140 141 139  K 3.4* 4.0 3.7  CL 97* 98 98  CO2 32 34* 32  GLUCOSE 106* 121* 121*  BUN 26* 21 17  CREATININE 1.02* 1.01* 1.00  CALCIUM 8.7* 8.7* 8.5*  MG  --   --  1.7    CBC: Recent Labs  Lab 08/10/22 1359 08/12/22 0414  WBC 7.8 8.5  NEUTROABS 6.3  --   HGB 13.9 12.4  HCT 44.0 40.2  MCV 93.8 94.6  PLT 132* 153    LFT Recent Labs  Lab 08/10/22 1359  AST 29  ALT 17  ALKPHOS 74  BILITOT 0.9  PROT 6.1*  ALBUMIN 3.0*  Antibiotics: Anti-infectives (From admission, onward)    Start     Dose/Rate Route Frequency Ordered Stop   08/11/22 2130  cefTRIAXone (ROCEPHIN) 1 g in sodium chloride 0.9 % 100 mL IVPB        1 g 200 mL/hr over 30 Minutes Intravenous Every 24 hours 08/11/22 2033 08/14/22 2129        DVT prophylaxis: Eliquis  Code Status: Full code  Family Communication:    CONSULTS cardiology   Objective    Physical Examination:   General-appears in no acute distress Heart-S1-S2, regular, no murmur auscultated Lungs-clear to auscultation bilaterally, no wheezing or crackles auscultated Abdomen-soft, nontender, no organomegaly Extremities-no edema in the lower extremities Neuro-alert, oriented x3, no focal deficit noted  Status is: Inpatient:             Meredeth Ide   [facility] If 7PM-7AM, please contact night-coverage at [email or web address], Office  [phone]   08/13/2022, 7:43 AM  LOS: 3 days

## 2022-08-13 NOTE — Progress Notes (Signed)
   08/13/22 0800  Orthostatic Lying   BP- Lying (!) 88/65  Pulse- Lying 109  Orthostatic Sitting  BP- Sitting 104/74  Pulse- Sitting 106  Orthostatic Standing at 0 minutes  BP- Standing at 0 minutes 93/72  Pulse- Standing at 0 minutes 115  Orthostatic Standing at 3 minutes  BP- Standing at 3 minutes  (Pt unable to complete the 3 min mark due to needing to sit down)  Pulse- Standing at 3 minutes 105

## 2022-08-13 NOTE — TOC Initial Note (Signed)
Transition of Care [facility]) - Initial/Assessment Note    Patient Details  Name: Allison Rollins MRN: [id number] Date of Birth: Oct 01, 1950  Transition of Care [facility]) CM/SW Contact:    Leitha Bleak, RN Phone Number: 08/13/2022, 2:02 PM  Clinical Narrative:     Patient admitted with acute on chronic combined systolic and diastolic CHF.   Patient  lives home alone, her children check on her regularly. She has a ramp, can and walker. She states she is slow but she prepares her own meals. PT is recommending HHPT. She is agreeable and asking for an Aide to assist with a bath.  Sarah with [facility] accepted. MD aware to order. TOC following   Expected Discharge Plan: Home w Home Health Services Barriers to Discharge: Continued Medical Work up   Patient Goals and CMS Choice Patient states their goals for this hospitalization and ongoing recovery are:: to go home CMS Medicare.gov Compare Post Acute Care list provided to:: Patient Choice offered to / list presented to : Patient     Expected Discharge Plan and Services      Living arrangements for the past 2 months: Single Family Home             HH Arranged: PT, Nurse's Aide   Date HH Agency Contacted: 08/13/22 Time HH Agency Contacted: 1401 Representative spoke with at [facility] Agency: [facility]  Prior Living Arrangements/Services Living arrangements for the past 2 months: Single Family Home Lives with:: Self Patient language and need for interpreter reviewed:: Yes Do you feel safe going back to the place where you live?: Yes      Need for Family Participation in Patient Care: Yes (Comment) Care giver support system in place?: Yes (comment)   Criminal Activity/Legal Involvement Pertinent to Current Situation/Hospitalization: No - Comment as needed  Activities of Daily Living Home Assistive Devices/Equipment: Eyeglasses ADL Screening (condition at time of admission) Patient's cognitive ability adequate to safely complete daily  activities?: Yes Is the patient deaf or have difficulty hearing?: No Does the patient have difficulty seeing, even when wearing glasses/contacts?: No Does the patient have difficulty concentrating, remembering, or making decisions?: No Patient able to express need for assistance with ADLs?: Yes Does the patient have difficulty dressing or bathing?: Yes Independently performs ADLs?: No Communication: Independent Dressing (OT): Needs assistance Is this a change from baseline?: Pre-admission baseline Grooming: Independent with device (comment) Feeding: Independent Bathing: Needs assistance Is this a change from baseline?: Pre-admission baseline Toileting: Needs assistance Is this a change from baseline?: Pre-admission baseline In/Out Bed: Independent with device (comment) Walks in Home: Independent with device (comment) Does the patient have difficulty walking or climbing stairs?: Yes Weakness of Legs: Both Weakness of Arms/Hands: Both  Permission Sought/Granted     Share Information with NAME: Elige Radon     Permission granted to share info w Relationship: Son   Emotional Assessment     Affect (typically observed): Accepting Orientation: : Oriented to Self, Oriented to Place, Oriented to  Time, Oriented to Situation Alcohol / Substance Use: Not Applicable Psych Involvement: No (comment)  Admission diagnosis:  Shortness of breath [R06.02] Acute on chronic congestive heart failure [I50.9] Acute on chronic combined systolic and diastolic CHF (congestive heart failure) [I50.43] Atrial fibrillation, unspecified type [I48.91] Acute on chronic congestive heart failure, unspecified heart failure type [I50.9] Patient Active Problem List   Diagnosis Date Noted   Acute on chronic combined systolic and diastolic CHF (congestive heart failure) 08/10/2022   Chronic respiratory failure with hypoxia  08/10/2022   Neurocognitive deficits 07/21/2022   Atrial fibrillation 07/10/2022   Morbid  obesity 02/24/2022   Gastroesophageal reflux disease with esophagitis 02/24/2022   Dyslipidemia 09/05/2021   Parkinson's disease 09/05/2021   Chronic obstructive pulmonary disease 09/05/2021   Edema 01/21/2021   Stage 3a chronic kidney disease 06/17/2020   Osteopenia 01/22/2020   Neuropathy 10/21/2019   Achalasia of esophagus    Restless leg 11/24/2018   Erosive gastritis    Seasonal allergic rhinitis due to pollen 07/20/2018   Recurrent depression 12/26/2015   Generalized anxiety disorder 03/12/2015   Prediabetes 03/12/2015   Back pain 03/12/2015   Stress incontinence 01/06/2015   Acute on chronic diastolic CHF (congestive heart failure) 01/03/2015   Obesity (BMI 30-39.9) 07/01/2011   Essential hypertension 07/01/2011   Mixed hyperlipidemia 07/01/2011   GERD (gastroesophageal reflux disease) 09/08/2010   PCP:  Gabriel Earing, FNP Pharmacy:   [facility] [address] [place] - [address] [place] [address] Phone: [phone] Fax: [phone]  [facility] [place], [place] - [address] [place] [address] Phone: [phone] Fax: [phone]  [facility] [place], [place] - [address] [place] [address] Phone: [phone] Fax: [phone]     Social Determinants of Health (SDOH) Social History: SDOH Screenings   Food Insecurity: No Food Insecurity (08/10/2022)  Housing: Low Risk  (08/10/2022)  Transportation Needs: No Transportation Needs (08/10/2022)  Utilities: Not At Risk (08/10/2022)  Alcohol Screen: Low Risk  (04/16/2022)  Depression (PHQ2-9): Low Risk  (04/16/2022)  Recent Concern: Depression (PHQ2-9) - High Risk (02/24/2022)  Financial Resource Strain: Medium Risk (04/16/2022)  Physical Activity: Insufficiently Active (04/16/2022)  Social Connections: Moderately Integrated (04/16/2022)  Recent Concern: Social Connections - Socially Isolated (03/24/2022)  Stress: No Stress Concern  Present (04/16/2022)  Tobacco Use: Medium Risk (08/10/2022)   SDOH Interventions:    Readmission Risk Interventions    08/13/2022    1:59 PM  Readmission Risk Prevention Plan  Transportation Screening Complete  PCP or Specialist Appt within 5-7 Days Not Complete  Home Care Screening Complete  Medication Review (RN CM) Complete

## 2022-08-13 NOTE — Care Management Important Message (Signed)
Important Message  Patient Details  Name: Allison Rollins MRN: [id number] Date of Birth: 1950/09/20   Medicare Important Message Given:  Yes     Corey Harold 08/13/2022, 2:19 PM

## 2022-08-13 NOTE — Evaluation (Signed)
Physical Therapy Evaluation Patient Details Name: Allison Rollins MRN: [id number] DOB: 1951/04/06 Today's Date: 08/13/2022  History of Present Illness  Allison Rollins is a 72 y.o. female with medical history significant for congestive heart failure, atrial fibrillation, COPD on chronic 3 L, hypertension.  Patient presented to ED with complaints of fall last night.  She was walking to the bathroom with the lights out when she bumped against the wall and fell on the floor, hitting her head.  She was unable to get up after this.,  She had to slide on the floor to the living room to get her walker.  She reports pitting bilateral lower extremity edema over the past 2 days, onset of difficulty breathing.  She denies orthopnea.  No chest pain.  No dizziness.  She has chronic stable productive cough.  She reports compliance with her Lasix and eliquis daily.   Clinical Impression  Patient required increased time with labored movement for sitting up at bedside, slow labored movement for transferring to chair and walking in room/hallway with flexed trunk, increased time for making turns, no loss of balance and limited mostly due to c/o fatigue.  Patent on 3LPM with SpO2 at 93% after gait training and briefly before requesting to go back to bed after having sat up for 3 hours earlier in AM.  Patient will benefit from continued skilled physical therapy in hospital and recommended venue below to increase strength, balance, endurance for safe ADLs and gait.        Recommendations for follow up therapy are one component of a multi-disciplinary discharge planning process, led by the attending physician.  Recommendations may be updated based on patient status, additional functional criteria and insurance authorization.  Follow Up Recommendations       Assistance Recommended at Discharge Set up Supervision/Assistance  Patient can return home with the following  A little help with walking and/or transfers;A little help  with bathing/dressing/bathroom;Help with stairs or ramp for entrance;Assistance with cooking/housework    Equipment Recommendations None recommended by PT  Recommendations for Other Services       Functional Status Assessment Patient has had a recent decline in their functional status and demonstrates the ability to make significant improvements in function in a reasonable and predictable amount of time.     Precautions / Restrictions Precautions Precautions: Fall Restrictions Weight Bearing Restrictions: No      Mobility  Bed Mobility Overal bed mobility: Needs Assistance Bed Mobility: Supine to Sit, Sit to Supine     Supine to sit: Supervision, Min guard Sit to supine: Supervision   General bed mobility comments: increased time, labored movement    Transfers Overall transfer level: Needs assistance Equipment used: Rolling walker (2 wheels) Transfers: Sit to/from Stand, Bed to chair/wheelchair/BSC Sit to Stand: Supervision, Min guard   Step pivot transfers: Supervision, Min guard       General transfer comment: slow labored movement    Ambulation/Gait Ambulation/Gait assistance: Min guard, Min assist Gait Distance (Feet): 45 Feet Assistive device: Rolling walker (2 wheels) Gait Pattern/deviations: Decreased step length - left, Decreased stance time - right, Decreased stride length, Trunk flexed Gait velocity: decreased     General Gait Details: slow labored cadence requiring increased time for making turns, flexed trunk and limited mostly due to fatigue while on 3LPM O2  Stairs            Wheelchair Mobility    Modified Rankin (Stroke Patients Only)       Balance  Overall balance assessment: Needs assistance Sitting-balance support: Feet supported, No upper extremity supported Sitting balance-Leahy Scale: Fair Sitting balance - Comments: fair/good seated at EOB   Standing balance support: During functional activity, Bilateral upper extremity  supported Standing balance-Leahy Scale: Fair Standing balance comment: using RW                             Pertinent Vitals/Pain Pain Assessment Pain Assessment: No/denies pain    Home Living Family/patient expects to be discharged to:: Private residence Living Arrangements: Alone Available Help at Discharge: Family Type of Home: House Home Access: Ramped entrance       Home Layout: One level Home Equipment: Agricultural consultant (2 wheels);Cane - single point      Prior Function Prior Level of Function : Independent/Modified Independent             Mobility Comments: household ambulator using RW ADLs Comments: Assisted by family, son completes grocery shopping and brings some meals to her.     Hand Dominance        Extremity/Trunk Assessment   Upper Extremity Assessment Upper Extremity Assessment: Generalized weakness    Lower Extremity Assessment Lower Extremity Assessment: Generalized weakness    Cervical / Trunk Assessment Cervical / Trunk Assessment: Kyphotic  Communication   Communication: No difficulties  Cognition Arousal/Alertness: Awake/alert Behavior During Therapy: WFL for tasks assessed/performed Overall Cognitive Status: Within Functional Limits for tasks assessed                                          General Comments      Exercises     Assessment/Plan    PT Assessment Patient needs continued PT services  PT Problem List Decreased strength;Decreased activity tolerance;Decreased balance;Decreased mobility       PT Treatment Interventions DME instruction;Gait training;Stair training;Functional mobility training;Therapeutic activities;Therapeutic exercise;Patient/family education;Balance training    PT Goals (Current goals can be found in the Care Plan section)  Acute Rehab PT Goals Patient Stated Goal: return home with family to assist PT Goal Formulation: With patient Time For Goal Achievement:  08/19/22 Potential to Achieve Goals: Good    Frequency Min 3X/week     Co-evaluation               AM-PAC PT "6 Clicks" Mobility  Outcome Measure Help needed turning from your back to your side while in a flat bed without using bedrails?: None Help needed moving from lying on your back to sitting on the side of a flat bed without using bedrails?: A Little Help needed moving to and from a bed to a chair (including a wheelchair)?: A Little Help needed standing up from a chair using your arms (e.g., wheelchair or bedside chair)?: A Little Help needed to walk in hospital room?: A Little Help needed climbing 3-5 steps with a railing? : A Lot 6 Click Score: 18    End of Session Equipment Utilized During Treatment: Oxygen;Gait belt Activity Tolerance: Patient tolerated treatment well;Patient limited by fatigue Patient left: in bed;with call bell/phone within reach Nurse Communication: Mobility status PT Visit Diagnosis: Unsteadiness on feet (R26.81);Other abnormalities of gait and mobility (R26.89);Muscle weakness (generalized) (M62.81)    Time: 1020-1051 PT Time Calculation (min) (ACUTE ONLY): 31 min   Charges:   PT Evaluation $PT Eval Moderate Complexity: 1 Mod PT Treatments $Therapeutic Activity:  23-37 mins        2:26 PM, 08/13/22 Ocie Bob, MPT Physical Therapist with [facility] [phone] office [phone] mobile phone

## 2022-08-13 NOTE — Progress Notes (Signed)
Rounding Note    Patient Name: Allison Rollins Date of Encounter: 08/13/2022  [facility] Cardiologist: Dina Rich, MD   Subjective   No complaints  Inpatient Medications    Scheduled Meds:  amiodarone  200 mg Oral Daily   apixaban  5 mg Oral BID   atorvastatin  40 mg Oral Daily   Chlorhexidine Gluconate Cloth  6 each Topical Daily   dextromethorphan-guaiFENesin  1 tablet Oral BID   gabapentin  600 mg Oral QID   metoprolol tartrate  12.5 mg Oral BID   midodrine  5 mg Oral BID WC   pantoprazole  40 mg Oral BID   pramipexole  0.5 mg Oral QHS   sertraline  200 mg Oral Daily   Continuous Infusions:  cefTRIAXone (ROCEPHIN)  IV 1 g (08/12/22 2143)   PRN Meds: acetaminophen **OR** acetaminophen, albuterol, ondansetron **OR** ondansetron (ZOFRAN) IV, mouth rinse, polyethylene glycol, sodium chloride   Vital Signs    Vitals:   08/13/22 0500 08/13/22 0601 08/13/22 0748 08/13/22 0811  BP:  (!) 92/59 95/60 101/68  Pulse:  (!) 107 (!) 107 (!) 108  Resp:  Temp:      TempSrc:      SpO2:  99% 99% 99%  Weight: 97.8 kg     Height:        Intake/Output Summary (Last 24 hours) at 08/13/2022 0812 Last data filed at 08/13/2022 0545 Gross per 24 hour  Intake 720 ml  Output 500 ml  Net 220 ml      08/13/2022    5:00 AM 08/12/2022    5:00 AM 08/11/2022    2:47 AM  Last 3 Weights  Weight (lbs) 215 lb 9.8 oz 211 lb 10.3 oz 212 lb 1.3 oz  Weight (kg) 97.8 kg 96 kg 96.2 kg      Telemetry    Afib elevated rates - Personally Reviewed  ECG    N/a - Personally Reviewed  Physical Exam   GEN: No acute distress.   Neck: No JVD Cardiac: irreg Respiratory: crackles bilaterally GI: Soft, nontender, non-distended  MS: 1+ bilateral LE edema Neuro:  Nonfocal  Psych: Normal affect   Labs    High Sensitivity Troponin:   Recent Labs  Lab 08/10/22 1359 08/10/22 1515  TROPONINIHS 11 9     Chemistry Recent Labs  Lab 08/10/22 1359 08/11/22 0518  08/12/22 0414 08/13/22 0407  NA 140 141 139 138  K 3.4* 4.0 3.7 3.8  CL 97* 98 98 97*  CO2 32 34* 32 33*  GLUCOSE 106* 121* 121* 125*  BUN 26* CREATININE 1.02* 1.01* 1.00 1.02*  CALCIUM 8.7* 8.7* 8.5* 8.4*  MG  --   --  1.7 1.7  PROT 6.1*  --   --   --   ALBUMIN 3.0*  --   --   --   AST 29  --   --   --   ALT 17  --   --   --   ALKPHOS 74  --   --   --   BILITOT 0.9  --   --   --   GFRNONAA 58* 59* 60* 58*  ANIONGAP Lipids No results for input(s): "CHOL", "TRIG", "HDL", "LABVLDL", "LDLCALC", "CHOLHDL" in the last 168 hours.  Hematology Recent Labs  Lab 08/10/22 1359 08/12/22 0414  WBC 7.8 8.5  RBC 4.69 4.25  HGB 13.9 12.4  HCT 44.0 40.2  MCV 93.8 94.6  MCH 29.6 29.2  MCHC 31.6 30.8  RDW 13.7 13.9  PLT 132* 153   Thyroid No results for input(s): "TSH", "FREET4" in the last 168 hours.  BNP Recent Labs  Lab 08/10/22 1359  BNP 783.0*    DDimer No results for input(s): "DDIMER" in the last 168 hours.   Radiology    No results found.  Cardiac Studies     Patient Profile     72 y.o. female history of chronic resp failure on home O2, persistent afib, HFmrEF, HTN presented after mechaniacl fall at home. Found to be hypotensive during admission, issues with afib with elevated HRs  Assessment & Plan    1.Hypotension/orthostasis - admitted with mechanical fall at home, noted hypotension on admission.  - started on midodrine  bid, room to titrate if needed - SBNps 90s to low 100s  - increase midodrine to  bid, could go to tid dosing if needed - check AM cortisol   2.Persistent afib - recent diagnosis within the last few months - issues with afib with RVR during 06/2022 admission, patient had turned down DCCV at the time. Started on amiodarone.  - management complicated as she tends to be bradycardic when in SR, also has had low bp's this admission.  - remains in afib this admission - she is on amiodarone  daily, lopressor  12.5mg  bid, eliquis  bid  - rates remain elevated. Increasing midodrine to  bid to allow higher lopressor dosing, increase to  bid.   3.HFmrEF - 06/2022 echo LVEF 40-45%, indet diastolic, mild RV dysfunction -BNP 783 on admission, CXR some vascular congestion - would consider some gentle diuresis once bp's more stabilized.    4. UTI - per primary team  For questions or updates, please contact [facility] Please consult [email or web address] for contact info under        Signed, Dina Rich, MD  08/13/2022, 8:12 AM

## 2022-08-13 NOTE — Progress Notes (Signed)
Bedside report given to [place] S. LPN. Pt transferred to room 322 via bed.

## 2022-08-14 DIAGNOSIS — I480 Paroxysmal atrial fibrillation: Secondary | ICD-10-CM | POA: Diagnosis not present

## 2022-08-14 DIAGNOSIS — I959 Hypotension, unspecified: Secondary | ICD-10-CM | POA: Diagnosis not present

## 2022-08-14 DIAGNOSIS — I5043 Acute on chronic combined systolic (congestive) and diastolic (congestive) heart failure: Secondary | ICD-10-CM | POA: Diagnosis not present

## 2022-08-14 LAB — BASIC METABOLIC PANEL
Anion gap: 7 (ref 5–15)
BUN: 15 mg/dL (ref 8–23)
CO2: 34 mmol/L — ABNORMAL HIGH (ref 22–32)
Calcium: 8.4 mg/dL — ABNORMAL LOW (ref 8.9–10.3)
Chloride: 98 mmol/L (ref 98–111)
Creatinine, Ser: 0.94 mg/dL (ref 0.44–1.00)
GFR, Estimated: >60 mL/min (ref >60)
Glucose, Bld: 131 mg/dL — ABNORMAL HIGH (ref 70–99)
Potassium: 4 mmol/L (ref 3.5–5.1)
Sodium: 139 mmol/L (ref 135–145)

## 2022-08-14 LAB — URINE CULTURE: Culture: 60000 — AB

## 2022-08-14 LAB — CORTISOL-AM, BLOOD: Cortisol - AM: 9.3 ug/dL (ref 6.7–22.6)

## 2022-08-14 NOTE — TOC Progression Note (Signed)
Transition of Care [facility]) - Progression Note    Patient Details  Name: Allison Rollins MRN: [id number] Date of Birth: 06/15/1950  [facility]) CM/SW Contact  Catalina Gravel, LCSW Phone Number: 08/14/2022, 4:46 PM  Clinical Narrative:    CSW followed up when MD entered Progress note. Pt likely not ready today. CSW secure chat to RN. DC expected 1-2 days.   Expected Discharge Plan: Home w Home Health Services Barriers to Discharge: Continued Medical Work up  Expected Discharge Plan and Services       Living arrangements for the past 2 months: Single Family Home                           HH Arranged: PT, Nurse's Aide HH Agency: Other - See comment [facility]) Date HH Agency Contacted: 08/13/22 Time HH Agency Contacted: 1401 Representative spoke with at [facility] Agency: [facility]   Social Determinants of Health (SDOH) Interventions SDOH Screenings   Food Insecurity: No Food Insecurity (08/10/2022)  Housing: Low Risk  (08/10/2022)  Transportation Needs: No Transportation Needs (08/10/2022)  Utilities: Not At Risk (08/10/2022)  Alcohol Screen: Low Risk  (04/16/2022)  Depression (PHQ2-9): Low Risk  (04/16/2022)  Recent Concern: Depression (PHQ2-9) - High Risk (02/24/2022)  Financial Resource Strain: Medium Risk (04/16/2022)  Physical Activity: Insufficiently Active (04/16/2022)  Social Connections: Moderately Integrated (04/16/2022)  Recent Concern: Social Connections - Socially Isolated (03/24/2022)  Stress: No Stress Concern Present (04/16/2022)  Tobacco Use: Medium Risk (08/10/2022)    Readmission Risk Interventions    08/13/2022    1:59 PM  Readmission Risk Prevention Plan  Transportation Screening Complete  PCP or Specialist Appt within 5-7 Days Not Complete  Home Care Screening Complete  Medication Review (RN CM) Complete

## 2022-08-14 NOTE — Progress Notes (Addendum)
[facility]  PROGRESS NOTE  Allison Rollins WUJ:[id number] DOB: Nov 27, 1950 DOA: 08/10/2022 PCP: Gabriel Earing, FNP   Brief HPI:   72 y.o. female with medical history significant for congestive heart failure, atrial fibrillation, COPD on chronic 3 L, hypertension. Came to ED with complaints of fall last night. She was admitted with concern for acute on chronic combined systolic and diastolic CHF exacerbation, but was noted to be hypotensive and is likely having some autonomic dysfunction in the setting of her Parkinson's disease with recurrent orthostasis she was started on midodrine twice daily, metoprolol per cardiology.  Also noted UTI, started on Rocephin, urine culture obtained.    Subjective   Patient seen and examined, blood pressure is still soft.   Assessment/Plan:    Mechanical fall associated with orthostatic hypotension -No history of syncope -Likely due to autonomic dysfunction in setting of Parkinson disease  -Started on midodrine per cardiology; dose of midodrine increased to 10 mg p.o. twice daily -Check orthostatics every 8 hours -PT evaluation obtained  UTI -Found to have abnormal UA with positive nitrite and leukocytes -Urine culture obtained, grew Proteus mirabilis, sensitive to Rocephin -Started on Rocephin till 4/20 -Continues to be afebrile with normal WBC  Combined systolic and diastolic heart failure -EF 40 to 45% -Not on diuretics at home -Chest x-ray showed pulmonary edema but no diuresis in the hospital due to hypotension as above -Currently requiring 2 L pulmonary of oxygen via nasal cannula  Persistent atrial fibrillation -Heart rate controlled -Continue amiodarone, metoprolol -Continue Eliquis for anticoagulation  Obesity -BMI 34.23      Medications     amiodarone  200 mg Oral Daily   apixaban  5 mg Oral BID   atorvastatin  40 mg Oral Daily   Chlorhexidine Gluconate Cloth  6 each Topical Daily   dextromethorphan-guaiFENesin   1 tablet Oral BID   gabapentin  600 mg Oral QID   metoprolol tartrate  25 mg Oral BID   midodrine  10 mg Oral BID WC   pantoprazole  40 mg Oral BID   pramipexole  0.5 mg Oral QHS   sertraline  200 mg Oral Daily     Data Reviewed:   CBG:  No results for input(s): "GLUCAP" in the last 168 hours.  SpO2: 99 % O2 Flow Rate (L/min): 2 L/min    Vitals:   08/14/22 0022 08/14/22 0351 08/14/22 0354 08/14/22 1454  BP: 95/70 99/68  92/73  Pulse: 94 86  98  Resp: 18 16    Temp: 97.6 F (36.4 C) (!) 97.4 F (36.3 C)  98.1 F (36.7 C)  TempSrc: Axillary Oral  Oral  SpO2: 96% 99%  99%  Weight:   101.9 kg   Height:          Data Reviewed:  Basic Metabolic Panel: Recent Labs  Lab 08/10/22 1359 08/11/22 0518 08/12/22 0414 08/13/22 0407 08/14/22 0629  NA 140 141 139 138 139  K 3.4* 4.0 3.7 3.8 4.0  CL 97* 98 98 97* 98  CO2 32 34* 32 33* 34*  GLUCOSE 106* 121* 121* 125* 131*  BUN 26* CREATININE 1.02* 1.01* 1.00 1.02* 0.94  CALCIUM 8.7* 8.7* 8.5* 8.4* 8.4*  MG  --   --  1.7 1.7  --     CBC: Recent Labs  Lab 08/10/22 1359 08/12/22 0414  WBC 7.8 8.5  NEUTROABS 6.3  --   HGB 13.9 12.4  HCT 44.0 40.2  MCV 93.8  94.6  PLT 132* 153    LFT Recent Labs  Lab 08/10/22 1359  AST 29  ALT 17  ALKPHOS 74  BILITOT 0.9  PROT 6.1*  ALBUMIN 3.0*     Antibiotics: Anti-infectives (From admission, onward)    Start     Dose/Rate Route Frequency Ordered Stop   08/11/22 2130  cefTRIAXone (ROCEPHIN) 1 g in sodium chloride 0.9 % 100 mL IVPB        1 g 200 mL/hr over 30 Minutes Intravenous Every 24 hours 08/11/22 2033 08/13/22 2130        DVT prophylaxis: Eliquis  Code Status: Full code  Family Communication:    CONSULTS cardiology   Objective    Physical Examination:  General-appears in no acute distress Heart-S1-S2, regular, no murmur auscultated Lungs-clear to auscultation bilaterally, no wheezing or crackles auscultated Abdomen-soft,  nontender, no organomegaly Extremities-no edema in the lower extremities Neuro-alert, oriented x3, no focal deficit noted  Status is: Inpatient:             Meredeth Ide   [facility] If 7PM-7AM, please contact night-coverage at [email or web address], Office  [phone]   08/14/2022, 4:02 PM  LOS: 4 days

## 2022-08-15 DIAGNOSIS — I5043 Acute on chronic combined systolic (congestive) and diastolic (congestive) heart failure: Secondary | ICD-10-CM | POA: Diagnosis not present

## 2022-08-15 DIAGNOSIS — I959 Hypotension, unspecified: Secondary | ICD-10-CM | POA: Diagnosis not present

## 2022-08-15 DIAGNOSIS — I480 Paroxysmal atrial fibrillation: Secondary | ICD-10-CM | POA: Diagnosis not present

## 2022-08-15 MED ORDER — MIDODRINE HCL 5 MG PO TABS
10.0000 mg | ORAL_TABLET | Freq: Three times a day (TID) | ORAL | Status: DC
  Administered 2022-08-15 – 2022-08-17 (×6): 10 mg via ORAL
  Filled 2022-08-15 (×12): qty 2

## 2022-08-15 NOTE — Progress Notes (Signed)
[facility]  PROGRESS NOTE  Allison Rollins WUJ:[id number] DOB: 09/12/1950 DOA: 08/10/2022 PCP: Gabriel Earing, FNP   Brief HPI:   72 y.o. female with medical history significant for congestive heart failure, atrial fibrillation, COPD on chronic 3 L, hypertension. Came to ED with complaints of fall last night. She was admitted with concern for acute on chronic combined systolic and diastolic CHF exacerbation, but was noted to be hypotensive and is likely having some autonomic dysfunction in the setting of her Parkinson's disease with recurrent orthostasis she was started on midodrine twice daily, metoprolol per cardiology.  Also noted UTI, started on Rocephin, urine culture obtained.    Subjective   Patient seen and examined, still blood pressure dropped to 80s on standing.   Assessment/Plan:    Mechanical fall associated with orthostatic hypotension -No history of syncope -Likely due to autonomic dysfunction in setting of Parkinson disease  -Started on midodrine per cardiology; dose of midodrine increased to 10 mg p.o. twice daily -Blood pressure still drops to 80s on standing. -Will increase midodrine to 10 mg p.o. 3 times daily -PT evaluation obtained; plan to go home with home health PT  UTI -Found to have abnormal UA with positive nitrite and leukocytes -Urine culture obtained, grew Proteus mirabilis, sensitive to Rocephin -Completed IV Rocephin on 4/20.  -Continues to be afebrile with normal WBC  Combined systolic and diastolic heart failure -EF 40 to 45% -Not on diuretics at home -Chest x-ray showed pulmonary edema but no diuresis in the hospital due to hypotension as above -Currently requiring 2 L of oxygen via nasal cannula -Cardiology following  Persistent atrial fibrillation -Heart rate controlled -Continue amiodarone, metoprolol -Continue Eliquis for anticoagulation  Obesity -BMI 34.23      Medications     amiodarone  200 mg Oral Daily    apixaban  5 mg Oral BID   atorvastatin  40 mg Oral Daily   Chlorhexidine Gluconate Cloth  6 each Topical Daily   dextromethorphan-guaiFENesin  1 tablet Oral BID   gabapentin  600 mg Oral QID   metoprolol tartrate  25 mg Oral BID   midodrine  10 mg Oral TID WC   pantoprazole  40 mg Oral BID   pramipexole  0.5 mg Oral QHS   sertraline  200 mg Oral Daily     Data Reviewed:   CBG:  No results for input(s): "GLUCAP" in the last 168 hours.  SpO2: 97 % O2 Flow Rate (L/min): 2 L/min    Vitals:   08/14/22 1454 08/14/22 2056 08/15/22 0415 08/15/22 0421  BP: 92/73 114/87  95/72  Pulse: 98 (!) 103  68  Resp:      Temp: 98.1 F (36.7 C) 98.3 F (36.8 C)  (!) 97.4 F (36.3 C)  TempSrc: Oral Oral  Oral  SpO2: 99% 100%  97%  Weight:   99.9 kg   Height:          Data Reviewed:  Basic Metabolic Panel: Recent Labs  Lab 08/10/22 1359 08/11/22 0518 08/12/22 0414 08/13/22 0407 08/14/22 0629  NA 140 141 139 138 139  K 3.4* 4.0 3.7 3.8 4.0  CL 97* 98 98 97* 98  CO2 32 34* 32 33* 34*  GLUCOSE 106* 121* 121* 125* 131*  BUN 26* CREATININE 1.02* 1.01* 1.00 1.02* 0.94  CALCIUM 8.7* 8.7* 8.5*MACY POLIO  MG  --   --  1.7 1.7  --     CBC: Recent Labs  Lab 08/10/22 1359 08/12/22 0414  WBC 7.8 8.5  NEUTROABS 6.3  --   HGB 13.9 12.4  HCT 44.0 40.2  MCV 93.8 94.6  PLT 132* 153    LFT Recent Labs  Lab 08/10/22 1359  AST 29  ALT 17  ALKPHOS 74  BILITOT 0.9  PROT 6.1*  ALBUMIN 3.0*     Antibiotics: Anti-infectives (From admission, onward)    Start     Dose/Rate Route Frequency Ordered Stop   08/11/22 2130  cefTRIAXone (ROCEPHIN) 1 g in sodium chloride 0.9 % 100 mL IVPB        1 g 200 mL/hr over 30 Minutes Intravenous Every 24 hours 08/11/22 2033 08/13/22 2130        DVT prophylaxis: Eliquis  Code Status: Full code  Family Communication:    CONSULTS cardiology   Objective    Physical Examination:  General-appears in no acute  distress Heart-S1-S2, irregular, no murmur auscultated Lungs-clear to auscultation bilaterally, no wheezing or crackles auscultated Abdomen-soft, nontender, no organomegaly Extremities-no edema in the lower extremities Neuro-alert, oriented x3, no focal deficit noted  Status is: Inpatient:             Allison Rollins   [facility] If 7PM-7AM, please contact night-coverage at [email or web address], Office  [phone]   08/15/2022, 2:23 PM  LOS: 5 days

## 2022-08-15 NOTE — Progress Notes (Signed)
   08/15/22 1100  Orthostatic Lying   BP- Lying 92/71  Pulse- Lying 95  Orthostatic Sitting  BP- Sitting (!) 89/57  Pulse- Sitting 68  Orthostatic Standing at 0 minutes  BP- Standing at 0 minutes (!) 86/75  Pulse- Standing at 0 minutes 111

## 2022-08-16 DIAGNOSIS — I5043 Acute on chronic combined systolic (congestive) and diastolic (congestive) heart failure: Secondary | ICD-10-CM | POA: Diagnosis not present

## 2022-08-16 DIAGNOSIS — I5041 Acute combined systolic (congestive) and diastolic (congestive) heart failure: Secondary | ICD-10-CM | POA: Diagnosis not present

## 2022-08-16 DIAGNOSIS — I959 Hypotension, unspecified: Secondary | ICD-10-CM | POA: Diagnosis not present

## 2022-08-16 DIAGNOSIS — I951 Orthostatic hypotension: Secondary | ICD-10-CM

## 2022-08-16 DIAGNOSIS — I4891 Unspecified atrial fibrillation: Secondary | ICD-10-CM | POA: Diagnosis not present

## 2022-08-16 DIAGNOSIS — I429 Cardiomyopathy, unspecified: Secondary | ICD-10-CM | POA: Diagnosis not present

## 2022-08-16 LAB — BASIC METABOLIC PANEL
Anion gap: 9 (ref 5–15)
BUN: 16 mg/dL (ref 8–23)
CO2: 32 mmol/L (ref 22–32)
Calcium: 8.7 mg/dL — ABNORMAL LOW (ref 8.9–10.3)
Chloride: 98 mmol/L (ref 98–111)
Creatinine, Ser: 0.93 mg/dL (ref 0.44–1.00)
GFR, Estimated: >60 mL/min (ref >60)
Glucose, Bld: 119 mg/dL — ABNORMAL HIGH (ref 70–99)
Potassium: 4.4 mmol/L (ref 3.5–5.1)
Sodium: 139 mmol/L (ref 135–145)

## 2022-08-16 MED ORDER — MIDODRINE HCL 10 MG PO TABS: 10.0000 mg | ORAL_TABLET | Freq: Three times a day (TID) | ORAL | 1 refills | Status: DC

## 2022-08-16 MED ORDER — FUROSEMIDE 40 MG PO TABS
40.0000 mg | ORAL_TABLET | Freq: Once | ORAL | Status: AC
  Administered 2022-08-16: 40 mg via ORAL
  Filled 2022-08-16: qty 1

## 2022-08-16 MED ORDER — METOPROLOL TARTRATE 25 MG PO TABS
12.5000 mg | ORAL_TABLET | Freq: Two times a day (BID) | ORAL | Status: DC
  Administered 2022-08-16 – 2022-08-17 (×2): 12.5 mg via ORAL
  Filled 2022-08-16 (×2): qty 1

## 2022-08-16 MED ORDER — METOPROLOL TARTRATE 25 MG PO TABS: 25.0000 mg | ORAL_TABLET | Freq: Two times a day (BID) | ORAL | 1 refills | Status: DC

## 2022-08-16 MED ORDER — FUROSEMIDE 10 MG/ML IJ SOLN
20.0000 mg | Freq: Once | INTRAMUSCULAR | Status: DC
  Filled 2022-08-16: qty 2

## 2022-08-16 NOTE — Care Management Important Message (Signed)
Important Message  Patient Details  Name: Allison Rollins MRN: [id number] Date of Birth: 09-Nov-1950   Medicare Important Message Given:  Yes     Corey Harold 08/16/2022, 12:43 PM

## 2022-08-16 NOTE — Progress Notes (Signed)
Mobility Specialist Progress Note:    08/16/22 1011  Mobility  Activity Ambulated with assistance in room  Level of Assistance Minimal assist, patient does 75% or more  Assistive Device Front wheel walker  Distance Ambulated (ft) 20 ft  Activity Response Tolerated well  Mobility Referral Yes  $Mobility charge 1 Mobility   Pt agreeable to mobility session, very pleasant. Required MinA to stand, CGA during ambulation for safety. Tolerated well, SpO2 92% on 3L during ambulation.  Pt sitting up in chair after session, all needs met, chair alarm on, call bell and phone in reach.   Feliciana Rossetti Mobility Specialist Please contact via Special educational needs teacher or  Rehab office at [phone]

## 2022-08-16 NOTE — Discharge Summary (Signed)
Physician Discharge Summary  Allison Rollins ZOX:[id number] DOB: 1950/08/26 DOA: 08/10/2022  PCP: Gabriel Earing, FNP  Admit date: 08/10/2022  Discharge date: 08/17/2022  Admitted From:Home  Disposition:  Home  Recommendations for Outpatient Follow-up:  Follow up with PCP in 1-2 weeks Continue on midodrine 3 times daily and metoprolol as prescribed below and follow-up with cardiology as scheduled 5/17 at 3 PM Continue on Lasix 20 mg twice daily for 3 days and then as needed thereafter Continue other home medications as prior  Home Health: Yes with PT  Equipment/Devices: Has home 3 L nasal cannula  Discharge Condition:Stable  CODE STATUS: Full  Diet recommendation: Heart Healthy  Brief/Interim Summary: 72 y.o. female with medical history significant for congestive heart failure, atrial fibrillation, COPD on chronic 3 L, hypertension. Came to ED with complaints of fall last night. She was admitted with concern for acute on chronic combined systolic and diastolic CHF exacerbation, but was noted to be hypotensive and is likely having some autonomic dysfunction in the setting of her Parkinson's disease with recurrent orthostasis she was started on midodrine twice daily, metoprolol per cardiology.  Also noted UTI, started on Rocephin, urine culture obtained.  Her urine grew Proteus mirabilis and she completed IV Rocephin treatment on 4/20.  Her blood pressures are now improved and she is no longer significantly symptomatic.  She will remain on midodrine as prescribed as well as metoprolol for heart rate control per cardiology with close outpatient follow-up already scheduled.  No other acute events or concerns noted throughout the course of the stay and she is in stable condition for discharge.  Discharge Diagnoses:  Principal Problem:   Acute combined systolic and diastolic heart failure Active Problems:   Atrial fibrillation   Essential hypertension   Chronic obstructive pulmonary  disease   Chronic respiratory failure with hypoxia   Cardiomyopathy   Hypotension  Principal discharge diagnosis: Mechanical fall likely associated with orthostatic hypotension along with Proteus UTI.  Discharge Instructions  Discharge Instructions     Diet - low sodium heart healthy   Complete by: As directed    Diet - low sodium heart healthy   Complete by: As directed    Increase activity slowly   Complete by: As directed    Increase activity slowly   Complete by: As directed    No wound care   Complete by: As directed    No wound care   Complete by: As directed       Allergies as of 08/17/2022       Reactions   Egg Solids, Whole    Farxiga [dapagliflozin] Swelling   Penicillins Itching, Rash   Has patient had a PCN reaction causing immediate rash, facial/tongue/throat swelling, SOB or lightheadedness with hypotension: no Has patient had a PCN reaction causing severe rash involving mucus membranes or skin necrosis: no Has patient had a PCN reaction that required hospitalization: no Has patient had a PCN reaction occurring within the last 10 years: no If all of the above answers are "NO", then may proceed with Cephalosporin use.   Sulfa Antibiotics Rash        Medication List     STOP taking these medications    lisinopril 10 MG tablet Commonly known as: ZESTRIL   metoprolol succinate 50 MG 24 hr tablet Commonly known as: TOPROL-XL       TAKE these medications    Accu-Chek Aviva Plus test strip Generic drug: glucose blood Test sugars daily   albuterol (  2.5 MG/3ML) 0.083% nebulizer solution Commonly known as: PROVENTIL Take 3 mLs (2.5 mg total) by nebulization every 6 (six) hours as needed for wheezing or shortness of breath.   albuterol 108 (90 Base) MCG/ACT inhaler Commonly known as: VENTOLIN HFA Inhale 2 puffs into the lungs every 6 (six) hours as needed for wheezing or shortness of breath.   Allergy Relief Cetirizine 10 MG tablet Generic  drug: cetirizine Take 1 tablet by mouth every day   alum & mag hydroxide-simeth 200-200-20 MG/5ML suspension Commonly known as: MAALOX/MYLANTA Take 30 mLs by mouth every 6 (six) hours as needed for indigestion or heartburn.   amiodarone 200 MG tablet Commonly known as: PACERONE Take 1 tablet (200 mg total) by mouth daily. What changed: Another medication with the same name was removed. Continue taking this medication, and follow the directions you see here.   apixaban 5 MG Tabs tablet Commonly known as: ELIQUIS Take 1 tablet (5 mg total) by mouth 2 (two) times daily.   atorvastatin 40 MG tablet Commonly known as: LIPITOR Take 1 tablet by mouth every day   CALCIUM PO Take 1 tablet by mouth daily.   cholecalciferol 25 MCG (1000 UNIT) tablet Commonly known as: VITAMIN D3 Take 1,000 Units by mouth daily.   famotidine 20 MG tablet Commonly known as: PEPCID Take 20 mg by mouth daily.   fluticasone 50 MCG/ACT nasal spray Commonly known as: FLONASE Use 2 sprays into each nostril every day What changed: See the new instructions.   furosemide 20 MG tablet Commonly known as: Lasix Take twice a day for 3 days and then as needed for swelling or shortness of breath. What changed:  medication strength how much to take how to take this when to take this additional instructions   gabapentin 300 MG capsule Commonly known as: NEURONTIN Take 300 mg by mouth See admin instructions. Take 600 mg by mouth three times daily and at bedtime.   metoprolol tartrate 25 MG tablet Commonly known as: LOPRESSOR Take 0.5 tablets (12.5 mg total) by mouth 2 (two) times daily.   midodrine 10 MG tablet Commonly known as: PROAMATINE Take 1 tablet (10 mg total) by mouth 3 (three) times daily with meals.   nitroGLYCERIN 0.4 MG SL tablet Commonly known as: NITROSTAT Place 1 tablet (0.4 mg total) under the tongue every 5 (five) minutes as needed for chest pain.   pantoprazole 40 MG tablet Commonly  known as: PROTONIX Take 1 tablet by mouth twice daily   pramipexole 0.5 MG tablet Commonly known as: MIRAPEX Take 1 tablet by mouth at bedtime   sertraline 100 MG tablet Commonly known as: ZOLOFT Take 2 tablets (200 mg total) by mouth daily.   VITAMIN C PO Take 1 tablet by mouth 3 (three) times daily.        Follow-up Information     [facility] Follow up.   Why: PT will call to schedule your first home visit.        Sharlene Dory, NP Follow up.   Specialty: Cardiology Why: Humberto Seals [facility] office - Friday Sep 10, 2022 at 3:00 PM (Arrive by 2:45 PM) with Lanora Manis, one of our nurse practitioners. Contact information: [address] [place] [address] [phone]         Gabriel Earing, FNP. Schedule an appointment as soon as possible for a visit in 1 week(s).   Specialty: Family Medicine Contact information: [address] [place] [address] [phone]  Allergies  Allergen Reactions   Egg Solids, Whole    Farxiga [Dapagliflozin] Swelling   Penicillins Itching and Rash    Has patient had a PCN reaction causing immediate rash, facial/tongue/throat swelling, SOB or lightheadedness with hypotension: no Has patient had a PCN reaction causing severe rash involving mucus membranes or skin necrosis: no Has patient had a PCN reaction that required hospitalization: no Has patient had a PCN reaction occurring within the last 10 years: no If all of the above answers are "NO", then may proceed with Cephalosporin use.    Sulfa Antibiotics Rash    Consultations: Cardiology   Procedures/Studies: CT Head Wo Contrast  Result Date: 08/10/2022 CLINICAL DATA:  Fall EXAM: CT HEAD WITHOUT CONTRAST CT CERVICAL SPINE WITHOUT CONTRAST TECHNIQUE: Multidetector CT imaging of the head and cervical spine was performed following the standard protocol without intravenous contrast. Multiplanar CT image reconstructions of the cervical  spine were also generated. RADIATION DOSE REDUCTION: This exam was performed according to the departmental dose-optimization program which includes automated exposure control, adjustment of the mA and/or kV according to patient size and/or use of iterative reconstruction technique. COMPARISON:  CT head and cervical spine 11/22/2019 FINDINGS: CT HEAD FINDINGS Brain: There is no acute intracranial hemorrhage, extra-axial fluid collection, or acute infarct. Parenchymal volume is normal for age. The ventricles are normal in size. Gray-white differentiation is preserved. There is extensive confluent hypodensity throughout the supratentorial white matter likely reflecting sequela of advanced chronic small-vessel ischemic change. 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 The pituitary and suprasellar region are normal. There is no mass lesion. There is no mass effect or midline shift. Vascular: There is calcification of the bilateral carotid siphons. Skull: Normal. Negative for fracture or focal lesion. Sinuses/Orbits: There is mild mucosal thickening in the left maxillary sinus. The globes and orbits are unremarkable. Other: None. CT CERVICAL SPINE FINDINGS Alignment: Normal. Skull base and vertebrae: Skull base alignment is maintained. Vertebral body heights are preserved. There is no evidence of acute fracture. There is no suspicious osseous lesion. Soft tissues and spinal canal: No prevertebral fluid or swelling. No visible canal hematoma. Disc levels: There is overall mild degenerative change of the cervical spine without evidence of high-grade osseous spinal canal or neural foraminal stenosis. Upper chest: The imaged portions of the lung apices are clear. Other: None. IMPRESSION: 1. No acute intracranial pathology. 2. No acute fracture or traumatic malalignment of the cervical spine. Electronically Signed   By: Lesia Hausen M.D.   On: 08/10/2022 15:04   CT Cervical Spine Wo  Contrast  Result Date: 08/10/2022 CLINICAL DATA:  Fall EXAM: CT HEAD WITHOUT CONTRAST CT CERVICAL SPINE WITHOUT CONTRAST TECHNIQUE: Multidetector CT imaging of the head and cervical spine was performed following the standard protocol without intravenous contrast. Multiplanar CT image reconstructions of the cervical spine were also generated. RADIATION DOSE REDUCTION: This exam was performed according to the departmental dose-optimization program which includes automated exposure control, adjustment of the mA and/or kV according to patient size and/or use of iterative reconstruction technique. COMPARISON:  CT head and cervical spine 11/22/2019 FINDINGS: CT HEAD FINDINGS Brain: There is no acute intracranial hemorrhage, extra-axial fluid collection, or acute infarct. Parenchymal volume is normal for age. The ventricles are normal in size. Gray-white differentiation is preserved. There is extensive confluent hypodensity throughout the supratentorial white matter likely reflecting sequela of advanced chronic small-vessel ischemic change. There are small remote lacunar infarcts in the cerebellum and right corona  radiata. The pituitary and suprasellar region are normal. There is no mass lesion. There is no mass effect or midline shift. Vascular: There is calcification of the bilateral carotid siphons. Skull: Normal. Negative for fracture or focal lesion. Sinuses/Orbits: There is mild mucosal thickening in the left maxillary sinus. The globes and orbits are unremarkable. Other: None. CT CERVICAL SPINE FINDINGS Alignment: Normal. Skull base and vertebrae: Skull base alignment is maintained. Vertebral body heights are preserved. There is no evidence of acute fracture. There is no suspicious osseous lesion. Soft tissues and spinal canal: No prevertebral fluid or swelling. No visible canal hematoma. Disc levels: There is overall mild degenerative change of the cervical spine without evidence of high-grade osseous spinal  canal or neural foraminal stenosis. Upper chest: The imaged portions of the lung apices are clear. Other: None. IMPRESSION: 1. No acute intracranial pathology. 2. No acute fracture or traumatic malalignment of the cervical spine. Electronically Signed   By: Lesia Hausen M.D.   On: 08/10/2022 15:04   DG Chest 2 View  Result Date: 08/10/2022 CLINICAL DATA:  Shortness of breath. EXAM: CHEST - 2 VIEW COMPARISON:  X-ray 07/10/2022 FINDINGS: Enlarged cardiopericardial silhouette with calcified aorta. Vascular congestion. Hyperinflation. No pneumothorax or effusion. Frontal view is rotated to the left. Lateral view is under penetrated with motion. Degenerative changes are seen of the spine. Overlapping cardiac leads. IMPRESSION: Limited x-rays. Enlarged heart with some vascular congestion. Electronically Signed   By: Karen Kays M.D.   On: 08/10/2022 14:57     Discharge Exam: Vitals:   08/17/22 0500 08/17/22 0835  BP: 93/60 100/60  Pulse: 79   Resp: 16   Temp:    SpO2:     Vitals:   08/16/22 1415 08/16/22 2054 08/17/22 0500 08/17/22 0835  BP: 112/81 91/66 93/60  100/60  Pulse: 82 78 79   Resp: Temp: 97.8 F (36.6 C) 98.1 F (36.7 C)    TempSrc: Oral     SpO2: 93% 93%    Weight:   99.9 kg   Height:        General: Pt is alert, awake, not in acute distress Cardiovascular: RRR, S1/S2 +, no rubs, no gallops Respiratory: CTA bilaterally, no wheezing, no rhonchi, 3 L nasal cannula Abdominal: Soft, NT, ND, bowel sounds + Extremities: no edema, no cyanosis    The results of significant diagnostics from this hospitalization (including imaging, microbiology, ancillary and laboratory) are listed below for reference.     Microbiology: Recent Results (from the past 240 hour(s))  Urine Culture     Status: Abnormal   Collection Time: 08/10/22  4:14 PM   Specimen: Urine, Clean Catch  Result Value Ref Range Status   Specimen Description   Final    URINE, CLEAN CATCH Performed at  [facility], [address], [place], [place] [address]    Special Requests   Final    NONE Performed at [facility], [address], [place], [place] [address]    Culture 60,000 COLONIES/mL PROTEUS MIRABILIS (A)  Final   Report Status 08/14/2022 FINAL  Final   Organism ID, Bacteria PROTEUS MIRABILIS (A)  Final      Susceptibility   Proteus mirabilis - MIC*    AMPICILLIN <=2 SENSITIVE Sensitive     CEFAZOLIN <=4 SENSITIVE Sensitive     CEFEPIME <=0.12 SENSITIVE Sensitive     CEFTRIAXONE <=0.25 SENSITIVE Sensitive     CIPROFLOXACIN 2 RESISTANT Resistant     GENTAMICIN <=1 SENSITIVE  Sensitive     IMIPENEM 8 INTERMEDIATE Intermediate     NITROFURANTOIN RESISTANT Resistant     TRIMETH/SULFA <=20 SENSITIVE Sensitive     AMPICILLIN/SULBACTAM <=2 SENSITIVE Sensitive     PIP/TAZO <=4 SENSITIVE Sensitive     * 60,000 COLONIES/mL PROTEUS MIRABILIS  MRSA Next Gen by PCR, Nasal     Status: None   Collection Time: 08/10/22  6:46 PM   Specimen: Nasal Mucosa; Nasal Swab  Result Value Ref Range Status   MRSA by PCR Next Gen NOT DETECTED NOT DETECTED Final    Comment: (NOTE) The GeneXpert MRSA Assay (FDA approved for NASAL specimens only), is one component of a comprehensive MRSA colonization surveillance program. It is not intended to diagnose MRSA infection nor to guide or monitor treatment for MRSA infections. Test performance is not FDA approved in patients less than 23 years old. Performed at [facility], [address], [place], [place] [address]      Labs: BNP (last 3 results) Recent Labs    07/10/22 1550 08/10/22 1359  BNP 767.0* 783.0*   Basic Metabolic Panel: Recent Labs  Lab 08/11/22 0518 08/12/22 0414 08/13/22 0407 08/14/22 0629 08/16/22 0434  NA 141 139 138 139 139  K 4.0 3.7 3.8 4.0 4.4  CL 98 98 97* 98 98  CO2 34* 32 33* 34* 32  GLUCOSE 121* 121* 125* 131* 119*  BUN 21 17 18 15 16   CREATININE 1.01* 1.00 1.02* 0.94 0.93  CALCIUM 8.7* 8.5* 8.4* 8.4* 8.7*  MG   --  1.7 1.7  --   --    Liver Function Tests: Recent Labs  Lab 08/10/22 1359  AST 29  ALT 17  ALKPHOS 74  BILITOT 0.9  PROT 6.1*  ALBUMIN 3.0*   No results for input(s): "LIPASE", "AMYLASE" in the last 168 hours. No results for input(s): "AMMONIA" in the last 168 hours. CBC: Recent Labs  Lab 08/10/22 1359 08/12/22 0414  WBC 7.8 8.5  NEUTROABS 6.3  --   HGB 13.9 12.4  HCT 44.0 40.2  MCV 93.8 94.6  PLT 132* 153   Cardiac Enzymes: No results for input(s): "CKTOTAL", "CKMB", "CKMBINDEX", "TROPONINI" in the last 168 hours. BNP: Invalid input(s): "POCBNP" CBG: No results for input(s): "GLUCAP" in the last 168 hours. D-Dimer No results for input(s): "DDIMER" in the last 72 hours. Hgb A1c No results for input(s): "HGBA1C" in the last 72 hours. Lipid Profile No results for input(s): "CHOL", "HDL", "LDLCALC", "TRIG", "CHOLHDL", "LDLDIRECT" in the last 72 hours. Thyroid function studies No results for input(s): "TSH", "T4TOTAL", "T3FREE", "THYROIDAB" in the last 72 hours.  Invalid input(s): "FREET3" Anemia work up No results for input(s): "VITAMINB12", "FOLATE", "FERRITIN", "TIBC", "IRON", "RETICCTPCT" in the last 72 hours. Urinalysis    Component Value Date/Time   COLORURINE YELLOW 08/11/2022 2139   APPEARANCEUR TURBID (A) 08/11/2022 2139   APPEARANCEUR Hazy (A) 02/20/2018 1516   LABSPEC 1.025 08/11/2022 2139   PHURINE 8.0 08/11/2022 2139   GLUCOSEU NEGATIVE 08/11/2022 2139   HGBUR NEGATIVE 08/11/2022 2139   BILIRUBINUR NEGATIVE 08/11/2022 2139   BILIRUBINUR Negative 02/20/2018 1516   KETONESUR NEGATIVE 08/11/2022 2139   PROTEINUR 100 (A) 08/11/2022 2139   UROBILINOGEN 1.0 05/08/2014 1040   NITRITE POSITIVE (A) 08/11/2022 2139   LEUKOCYTESUR MODERATE (A) 08/11/2022 2139   Sepsis Labs Recent Labs  Lab 08/10/22 1359 08/12/22 0414  WBC 7.8 8.5   Microbiology Recent Results (from the past 240 hour(s))  Urine Culture     Status:  Abnormal   Collection Time:  08/10/22  4:14 PM   Specimen: Urine, Clean Catch  Result Value Ref Range Status   Specimen Description   Final    URINE, CLEAN CATCH Performed at [facility], [address], [place], [place] [address]    Special Requests   Final    NONE Performed at [facility], [address], [place], [place] [address]    Culture 60,000 COLONIES/mL PROTEUS MIRABILIS (A)  Final   Report Status 08/14/2022 FINAL  Final   Organism ID, Bacteria PROTEUS MIRABILIS (A)  Final      Susceptibility   Proteus mirabilis - MIC*    AMPICILLIN <=2 SENSITIVE Sensitive     CEFAZOLIN <=4 SENSITIVE Sensitive     CEFEPIME <=0.12 SENSITIVE Sensitive     CEFTRIAXONE <=0.25 SENSITIVE Sensitive     CIPROFLOXACIN 2 RESISTANT Resistant     GENTAMICIN <=1 SENSITIVE Sensitive     IMIPENEM 8 INTERMEDIATE Intermediate     NITROFURANTOIN RESISTANT Resistant     TRIMETH/SULFA <=20 SENSITIVE Sensitive     AMPICILLIN/SULBACTAM <=2 SENSITIVE Sensitive     PIP/TAZO <=4 SENSITIVE Sensitive     * 60,000 COLONIES/mL PROTEUS MIRABILIS  MRSA Next Gen by PCR, Nasal     Status: None   Collection Time: 08/10/22  6:46 PM   Specimen: Nasal Mucosa; Nasal Swab  Result Value Ref Range Status   MRSA by PCR Next Gen NOT DETECTED NOT DETECTED Final    Comment: (NOTE) The GeneXpert MRSA Assay (FDA approved for NASAL specimens only), is one component of a comprehensive MRSA colonization surveillance program. It is not intended to diagnose MRSA infection nor to guide or monitor treatment for MRSA infections. Test performance is not FDA approved in patients less than 43 years old. Performed at [facility], [address], [place], [place] [address]      Time coordinating discharge: 35 minutes  SIGNED:   Erick Blinks, DO [facility] 08/17/2022, 10:53 AM  If 7PM-7AM, please contact night-coverage [email or web address]

## 2022-08-16 NOTE — Progress Notes (Signed)
Requested close f/u with scheduling team per Dr. Margaretmary Eddy request, appt info placed on AVS.

## 2022-08-16 NOTE — Progress Notes (Addendum)
Progress Note  Patient Name: Allison Rollins Date of Encounter: 08/16/2022  Primary Cardiologist: Dina Rich, MD  Subjective   No symptoms overnight no symptoms except for severe fatigue and intermittent SOB.  Inpatient Medications    Scheduled Meds:  amiodarone  200 mg Oral Daily   apixaban  5 mg Oral BID   atorvastatin  40 mg Oral Daily   dextromethorphan-guaiFENesin  1 tablet Oral BID   gabapentin  600 mg Oral QID   metoprolol tartrate  25 mg Oral BID   midodrine  10 mg Oral TID WC   pantoprazole  40 mg Oral BID   pramipexole  0.5 mg Oral QHS   sertraline  200 mg Oral Daily   Continuous Infusions:  PRN Meds: acetaminophen **OR** acetaminophen, albuterol, ondansetron **OR** ondansetron (ZOFRAN) IV, mouth rinse, polyethylene glycol, sodium chloride   Vital Signs    Vitals:   08/15/22 1443 08/15/22 2005 08/16/22 0414 08/16/22 0500  BP: (!) 125/97 109/76 (!) 108/91   Pulse: 78 87 96   Resp:  20 20   Temp: 97.7 F (36.5 C) 98.4 F (36.9 C) 97.6 F (36.4 C)   TempSrc: Oral Oral    SpO2: 93% 94% 99%   Weight:    100.3 kg  Height:        Intake/Output Summary (Last 24 hours) at 08/16/2022 1240 Last data filed at 08/15/2022 1300 Gross per 24 hour  Intake 240 ml  Output --  Net 240 ml   Filed Weights   08/14/22 0354 08/15/22 0415 08/16/22 0500  Weight: 101.9 kg 99.9 kg 100.3 kg    Telemetry     Personally reviewed, atrial fibrillation, HR 80 to 90s  ECG    Not performed today  Physical Exam   GEN: No acute distress.   Neck:  JVD elevated till the angle of mandible Cardiac: Irregular rate and rhythm, no murmur, rub, or gallop.  Respiratory: Nonlabored. Clear to auscultation bilaterally. GI: Soft, nontender, bowel sounds present. MS: 1+ pitting edema in b/l LE; No deformity. Neuro:  Nonfocal. Psych: Alert and oriented x 3. Normal affect.  Labs    Chemistry Recent Labs  Lab 08/10/22 1359 08/11/22 0518 08/13/22 0407 08/14/22 0629  08/16/22 0434  NA 140   < > 138 139 139  K 3.4*   < > 3.8 4.0 4.4  CL 97*   < > 97* 98 98  CO2 32   < > 33* 34* 32  GLUCOSE 106*   < > 125* 131* 119*  BUN 26*   < > CREATININE 1.02*   < > 1.02* 0.94 0.93  CALCIUM 8.7*   < > 8.4* 8.4* 8.7*  PROT 6.1*  --   --   --   --   ALBUMIN 3.0*  --   --   --   --   AST 29  --   --   --   --   ALT 17  --   --   --   --   ALKPHOS 74  --   --   --   --   BILITOT 0.9  --   --   --   --   GFRNONAA 58*   < > 58* >60 >60  ANIONGAP 11   < > < > = values in this interval not displayed.     Hematology Recent Labs  Lab 08/10/22 1359 08/12/22 0414  WBC  7.8 8.5  RBC 4.69 4.25  HGB 13.9 12.4  HCT 44.0 40.2  MCV 93.8 94.6  MCH 29.6 29.2  MCHC 31.6 30.8  RDW 13.7 13.9  PLT 132* 153    Cardiac Enzymes Recent Labs  Lab 08/10/22 1359 08/10/22 1515  TROPONINIHS 11 9    BNP Recent Labs  Lab 08/10/22 1359  BNP 783.0*     DDimerNo results for input(s): "DDIMER" in the last 168 hours.   Radiology    No results found.  Assessment & Plan   Patient is a 72 year old F known to have A-fib (newly diagnosed in 12/2021), COPD on chronic 3L home oxygen, HTN presented to the ER with a fall.  She was previously hospitalized in 06/2022 with A-fib with RVR and HFpEF.  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 DCCV was deferred during recent hospital admission as patient was hesitant. She was re-admitted again in 07/2022 with a fall secondary to dizziness and noted to be in atrial fibrillation. Will decrease the dose of metoprolol tartrate from 25 mg to 12.5 mg twice daily, continue amiodarone 200 mg once daily, Eliquis 5 mg twice daily. Due to new onset cardiomyopathy with LVEF 40 to 45%, she will benefit from DCCV however due to persistently low blood pressures requiring midodrine  support, will defer DCCV until her blood pressure recovers to an acceptable degree. She will follow-up with cardiology clinic in 1 month upon discharge.  # New onset cardiomyopathy LVEF 40 to 45%: Currently on midodrine, continue metoprolol titrated to decrease dose, 12.5 mg twice daily.  She is mildly volume overloaded, will administer IV Lasix 20 mg one-time dose today and reassess her symptoms tomorrow. Further GDMT initiation is limited due to chronic low blood pressures.  I have spent a total of 30 minutes with patient reviewing chart , telemetry, EKGs, labs and examining patient as well as establishing an assessment and plan that was discussed with the patient.  > 50% of time was spent in direct patient care.     Signed, Marjo Bicker, MD  08/16/2022, 12:40 PM

## 2022-08-16 NOTE — Consult Note (Addendum)
   [facility] Inpatient Consult   08/16/2022  Allison Rollins [birth year] [id number]     Location: [facility] RN Hospital Liaison screened remotely(Startup).   [facility] Customer service manager [facility]) Accountable Care Organization [ACO] Patient: Orthoptist)    Primary Care Provider:  Gabriel Earing, FNP [facility] Family Medicine   Patient screened for 30  days readmission hospitalization with noted medium risk score for unplanned readmission risk with 2 IP/2 ED in 6 months. [facility]/Population Health RN liaison will assess for potential [facility]) Care Management service needs for post hospital transition for care coordination. Pt actively involved with [facility] crae team with social worker and [facility] RN care coordinator. Will alert team pt will be discharged today.  Plan. Pt will be [facility] with [facility]  Will collaborate with the active [facility] team on pt's discharged today.    [facility] Care Management/Population Health does not replace or interfere with any arrangements made by the Inpatient Transition of Care team.   For questions contact:     Elliot Cousin, RN, BSN [facility] Liaison [place]   [facility]  Population Health Office Hours MTWF 8:00 am to 6 pm off on Thursday [phone] mobile [phone] [Office toll free line][facility] Office Hours are M-F 8:30 - 5 pm 24 hour nurse advise line [phone] Conceirge  [email or web address]

## 2022-08-16 NOTE — TOC Transition Note (Signed)
Transition of Care (TOC) - CM/SW Discharge Note   Patient Details  Name: Allison Rollins MRN: [id number] [facility] [birth year]  Transition of Care [facility]) CM/SW Contact:  Leitha Bleak, RN Phone Number: 08/16/2022, 11:27 AM   Clinical Narrative:  MD will reassess, may discharge home later today. Home health orders are in. Sarah with [facility] updated.   Addendum: Patient discharging home, Maralyn Sago updated.     Final next level of care: Home w Home Health Services Barriers to Discharge: Barriers Resolved   Patient Goals and CMS Choice CMS Medicare.gov Compare Post Acute Care list provided to:: Patient Choice offered to / list presented to : Patient  Discharge Placement      Patient and family notified of of transfer: 08/16/22  Discharge Plan and Services Additional resources added to the After Visit Summary for          [facility] Arranged: PT, Nurse's Aide HH Agency: Other - See comment [facility]) Date HH Agency Contacted: 08/13/22 Time HH Agency Contacted: 1401 Representative spoke with at [facility] Agency: Versie Starks  Social Determinants of Health (SDOH) Interventions SDOH Screenings   Food Insecurity: No Food Insecurity (08/10/2022)  Housing: Low Risk  (08/10/2022)  Transportation Needs: No Transportation Needs (08/10/2022)  Utilities: Not At Risk (08/10/2022)  Alcohol Screen: Low Risk  (04/16/2022)  Depression (PHQ2-9): Low Risk  (04/16/2022)  Recent Concern: Depression (PHQ2-9) - High Risk (02/24/2022)  Financial Resource Strain: Medium Risk (04/16/2022)  Physical Activity: Insufficiently Active (04/16/2022)  Social Connections: Moderately Integrated (04/16/2022)  Recent Concern: Social Connections - Socially Isolated (03/24/2022)  Stress: No Stress Concern Present (04/16/2022)  Tobacco Use: Medium Risk (08/10/2022)    Readmission Risk Interventions    08/13/2022    1:59 PM  Readmission Risk Prevention Plan  Transportation Screening Complete  PCP or Specialist  Appt within 5-7 Days Not Complete  Home Care Screening Complete  Medication Review (RN CM) Complete

## 2022-08-17 ENCOUNTER — Ambulatory Visit: Payer: Self-pay | Admitting: *Deleted

## 2022-08-17 ENCOUNTER — Encounter: Payer: Self-pay | Admitting: *Deleted

## 2022-08-17 DIAGNOSIS — I5041 Acute combined systolic (congestive) and diastolic (congestive) heart failure: Secondary | ICD-10-CM | POA: Diagnosis not present

## 2022-08-17 DIAGNOSIS — I4891 Unspecified atrial fibrillation: Secondary | ICD-10-CM | POA: Diagnosis not present

## 2022-08-17 DIAGNOSIS — I429 Cardiomyopathy, unspecified: Secondary | ICD-10-CM | POA: Diagnosis not present

## 2022-08-17 DIAGNOSIS — I959 Hypotension, unspecified: Secondary | ICD-10-CM | POA: Diagnosis not present

## 2022-08-17 MED ORDER — METOPROLOL TARTRATE 25 MG PO TABS: 12.5000 mg | ORAL_TABLET | Freq: Two times a day (BID) | ORAL | 0 refills | Status: DC

## 2022-08-17 MED ORDER — FUROSEMIDE 20 MG PO TABS: ORAL_TABLET | ORAL | 1 refills | Status: DC

## 2022-08-17 NOTE — Progress Notes (Signed)
Mobility Specialist Progress Note:    08/17/22 0950  Mobility  Activity Ambulated with assistance in room;Transferred from bed to chair  Level of Assistance Contact guard assist, steadying assist  Assistive Device Front wheel walker  Distance Ambulated (ft) 15 ft  Activity Response Tolerated well  Mobility Referral Yes  $Mobility charge 1 Mobility   Pt agreeable to mobility session. Tolerated well, had audible SOB, HR 81 bpm during ambulation. Pt sitting up in chair, alarm on, call bell in reach, all needs met.   Feliciana Rossetti Mobility Specialist Please contact via Special educational needs teacher or  Rehab office at [phone]

## 2022-08-17 NOTE — Progress Notes (Addendum)
Progress Note  Patient Name: Allison Rollins Date of Encounter: 08/17/2022  Primary Cardiologist: Dina Rich, MD  Subjective   No symptoms overnight.SOB improved.  Inpatient Medications    Scheduled Meds:  amiodarone  200 mg Oral Daily   apixaban  5 mg Oral BID   atorvastatin  40 mg Oral Daily   dextromethorphan-guaiFENesin  1 tablet Oral BID   gabapentin  600 mg Oral QID   metoprolol tartrate  12.5 mg Oral BID   midodrine  10 mg Oral TID WC   pantoprazole  40 mg Oral BID   pramipexole  0.5 mg Oral QHS   sertraline  200 mg Oral Daily   Continuous Infusions:  PRN Meds: acetaminophen **OR** acetaminophen, albuterol, ondansetron **OR** ondansetron (ZOFRAN) IV, mouth rinse, polyethylene glycol, sodium chloride   Vital Signs    Vitals:   08/16/22 1415 08/16/22 2054 08/17/22 0500 08/17/22 0835  BP: 112/81 91/66 93/60  100/60  Pulse: 82 78 79   Resp: Temp: 97.8 F (36.6 C) 98.1 F (36.7 C)    TempSrc: Oral     SpO2: 93% 93%    Weight:   99.9 kg   Height:        Intake/Output Summary (Last 24 hours) at 08/17/2022 1027 Last data filed at 08/17/2022 0830 Gross per 24 hour  Intake 360 ml  Output 1500 ml  Net -1140 ml   Filed Weights   08/15/22 0415 08/16/22 0500 08/17/22 0500  Weight: 99.9 kg 100.3 kg 99.9 kg    Telemetry     Personally reviewed, atrial fibrillation, HR controlled  ECG    Not performed today  Physical Exam   GEN: No acute distress.   Neck:  JVD elevated till the angle of mandible Cardiac: Irregular rate and rhythm, no murmur, rub, or gallop.  Respiratory: Nonlabored. Clear to auscultation bilaterally. GI: Soft, nontender, bowel sounds present. MS: Trace pitting edema in b/l LE; No deformity. Neuro:  Nonfocal. Psych: Alert and oriented x 3. Normal affect.  Labs    Chemistry Recent Labs  Lab 08/10/22 1359 08/11/22 0518 08/13/22 0407 08/14/22 0629 08/16/22 0434  NA 140   < > 138 139 139  K 3.4*   < > 3.8 4.0  4.4  CL 97*   < > 97* 98 98  CO2 32   < > 33* 34* 32  GLUCOSE 106*   < > 125* 131* 119*  BUN 26*   < > CREATININE 1.02*   < > 1.02* 0.94 0.93  CALCIUM 8.7*   < > 8.4* 8.4* 8.7*  PROT 6.1*  --   --   --   --   ALBUMIN 3.0*  --   --   --   --   AST 29  --   --   --   --   ALT 17  --   --   --   --   ALKPHOS 74  --   --   --   --   BILITOT 0.9  --   --   --   --   GFRNONAA 58*   < > 58* >60 >60  ANIONGAP 11   < > < > = values in this interval not displayed.     Hematology Recent Labs  Lab 08/10/22 1359 08/12/22 0414  WBC 7.8 8.5  RBC 4.69 4.25  HGB 13.9 12.4  HCT 44.0 40.2  MCV 93.8 94.6  MCH 29.6 29.2  MCHC 31.6 30.8  RDW 13.7 13.9  PLT 132* 153    Cardiac Enzymes Recent Labs  Lab 08/10/22 1359 08/10/22 1515  TROPONINIHS 11 9    BNP Recent Labs  Lab 08/10/22 1359  BNP 783.0*     DDimerNo results for input(s): "DDIMER" in the last 168 hours.   Radiology    No results found.  Assessment & Plan   Patient is a 72 year old F known to have A-fib (newly diagnosed in 12/2021), COPD on chronic 3L home oxygen, HTN presented to the ER with a fall.  She was previously hospitalized in 06/2022 with A-fib with RVR and HFpEF.  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 DCCV was deferred during recent hospital admission as patient was hesitant. She was re-admitted again in 07/2022 with a fall secondary to dizziness and noted to be in atrial fibrillation. Continue metoprolol tartrate 12.5 mg twice daily, continue amiodarone 200 mg once daily, Eliquis 5 mg twice daily. Due to new onset cardiomyopathy with LVEF 40 to 45%, she will benefit from DCCV however due to persistently low blood pressures requiring midodrine support, will defer DCCV until her blood pressure recovers to an acceptable degree. She will  follow-up with cardiology clinic in 1 month upon discharge.  # New onset cardiomyopathy LVEF 40 to 45%: Currently on midodrine, continue metoprolol titrate, 12.5 mg twice daily.  She was mildly volume overloaded yesterday, had urine output 1.6 and after administering p.o. Lasix 40 mg. ReDS 32 today but JVD is elevated to the angle of mandible. She will benefit from p.o. Lasix 20 mg twice daily for 3 days followed by p.o. Lasix 20 mg as needed for SOB/LE swelling. Further GDMT initiation is limited by low blood pressures.  [facility] will sign off.   Medication Recommendations: P.o. Lasix 20 mg twice daily for 3 days followed by p.o. Lasix 20 mg as needed for SOB/LE swelling, metoprolol tartrate 12.5 mg twice daily, amiodarone 200 mg once daily, Eliquis 5 mg twice daily, midodrine 10 mg 3 times daily. Other recommendations (labs, testing, etc): None Follow up as an outpatient: Cardiology follow-up in 1 month upon discharge.   I have spent a total of 30 minutes with patient reviewing chart , telemetry, EKGs, labs and examining patient as well as establishing an assessment and plan that was discussed with the patient.  > 50% of time was spent in direct patient care.     Signed, Marjo Bicker, MD  08/17/2022, 10:27 AM

## 2022-08-17 NOTE — Patient Outreach (Signed)
Care Coordination   Follow Up Visit Note   08/17/2022  Name: Allison Rollins MRN: [id number] DOB: Mar 27, 1951  Allison Rollins is a 72 y.o. year old female who sees Gabriel Earing, FNP for primary care. I spoke with patient's daughter-in-law, [name] & Denyse Dago Martorano by phone today.  What matters to the patients health and wellness today?  Find Help in My Community.    Goals Addressed               This Visit's Progress     Find Help in My Community. (pt-stated)   On track     Care Coordination Interventions:  Interventions Today    Flowsheet Row Most Recent Value  Chronic Disease   Chronic disease during today's visit Hypertension (HTN), Chronic Obstructive Pulmonary Disease (COPD), Other, Chronic Kidney Disease/End Stage Renal Disease (ESRD)  [Parkinson's Disease, Neuropathy, Recurrent Depression & Generalized Anxiety Disorder]  General Interventions   General Interventions Discussed/Reviewed General Interventions Discussed, General Interventions Reviewed, Annual Eye Exam, Labs, Durable Medical Equipment (DME), Vaccines, Health Screening, Community Resources, Doctor Visits, Level of Care  Labs Hgb A1c annually  Vaccines COVID-19, Flu, Pneumonia, RSV, Shingles  Doctor Visits Discussed/Reviewed Doctor Visits Discussed, Doctor Visits Reviewed, Annual Wellness Visits, PCP, Specialist  Health Screening Colonoscopy, Bone Density, Mammogram  Durable Medical Equipment (DME) BP Cuff, Walker, Psychologist, forensic  PCP/Specialist Visits Compliance with follow-up visit  Level of Care Applications, Assisted Living, Personal Care Counsellor, Personal Care Services, Other  [facility] Health Financial Assistance Application]  Exercise Interventions   Exercise Discussed/Reviewed Exercise Discussed, Exercise Reviewed, Physical Activity, Assistive device use and maintanence  Physical Activity Discussed/Reviewed Physical Activity Discussed, Physical Activity  Reviewed, Types of exercise  Education Interventions   Education Provided Provided Therapist, sports, Provided Education, Provided Web-based Education  Provided Verbal Education On Nutrition, Eye Care, Mental Health/Coping with Illness, Applications, Exercise, Medication, Development worker, community, [employer], When to see the doctor  Applications Medicaid, Personal Care Services, Other  [employer] Health Financial Assistance Application]  Mental Health Interventions   Mental Health Discussed/Reviewed Mental Health Discussed, Mental Health Reviewed, Coping Strategies, Crisis, Anxiety, Depression  Nutrition Interventions   Nutrition Discussed/Reviewed Nutrition Discussed, Nutrition Reviewed, Fluid intake, Portion sizes, Decreasing sugar intake, Decreasing salt, Decreasing fats, Increaing proteins  Pharmacy Interventions   Pharmacy Dicussed/Reviewed Pharmacy Topics Discussed, Pharmacy Topics Reviewed, Medication Adherence, Affording Medications  Safety Interventions   Safety Discussed/Reviewed Safety Discussed, Safety Reviewed, Fall Risk  Advanced Directive Interventions   Advanced Directives Discussed/Reviewed Advanced Directives Discussed, Advanced Directives Reviewed     Active Listening & Reflection Utilized.  Verbalization of Feelings Encouraged.  Emotional Support Provided. Feelings of Caregiver Burnout Validated. Caregiver Stress Acknowledged. Caregiver Resources Reviewed. Caregiver Support Groups Mailed. Self-Enrollment in Caregiver Support Group of Interest Emphasized. Problem Solving Interventions Enacted. Solution-Focused Strategies Employed. Task-Centered Solutions Implemented. Acceptance & Commitment Therapy Initiated. Cognitive Behavioral Therapy Engaged. Client Centered Therapy Performed. CSW Collaboration with Daughter-in-Law, [name] to Confirm Discharge from [facility] (# [phone]) on 08/17/2022, to Return Home to Live Alone & Resume Home Health  Services.  CSW Collaboration with Daughter-in-Law, [name] to [facility] Skilled Nursing, Physical Therapy & Mady Haagensen Aide Services to Resume, through [facility] [phone]). CSW Collaboration with Daughter-in-Law, [name] to Confirm Patient's Continued Disinterest in Receiving Placement into Higher Level of Care (I.e Memory Care Assisted Living Facility, Extended Care Facility, [facility], Etc.). CSW Collaboration with Daughter-in-Law, [name]  to Assist with Completion of Medicaid Application & Encouraged Submission to The [facility] of Social Services [phone]), for Processing. CSW Collaboration with Daughter-in-Law, [name] to Encourage Review of Medicaid Application with Patient to Ensure Accuracy & Assist with Obtaining Necessary Documents, Prior to Submitting Application to The [facility] [phone]), for Processing. CSW Collaboration with Daughter-in-Law, [name] to Confirm Intent to [facility] Application & Necessary Documentation, to The [facility] of Social Services [phone]) for Processing, No Later than 08/20/2022.  CSW Collaboration with Daughter-in-Law, [name] to Encourage Completion of Application for Personal Care Services & Submission to Primary Care Provider, Family Nurse Practitioner, Harlow Mares with [facility] [phone]), for Review & Signature, Once Approved for Medicaid, through The [facility] of Social Services [phone]). CSW Collaboration with Daughter-in-Law, [name] to [employer] with CSW [phone]# [phone]), if She Has Questions, Needs Assistance with Application Completion & Submission, or If Additional Social Work Needs Are Identified Between Now & Our Next Scheduled Telephone Follow-Up [employer].      SDOH assessments and interventions completed:   Yes.  Care Coordination Interventions:  Yes, provided.   Follow up plan: Follow up call scheduled for 08/31/2022 at 11:00 am.   Encounter Outcome:  Pt. Visit Completed.   Danford Bad, BSW, MSW, LCSW  Licensed Restaurant manager, fast food Health System  Mailing [place] [address], [place], [place] [address] Physical Address-[address], [place], [place] [address] Toll Free Main # [phone] Fax # [phone] Cell # [phone] [email or web address]

## 2022-08-17 NOTE — Patient Instructions (Signed)
Visit Information  Thank you for taking time to visit with me today. Please don't hesitate to contact me if I can be of assistance to you.   Following are the goals we discussed today:   Goals Addressed               This Visit's Progress     Find Help in My Community. (pt-stated)   On track     Care Coordination Interventions:  Interventions Today    Flowsheet Row Most Recent Value  Chronic Disease   Chronic disease during today's visit Hypertension (HTN), Chronic Obstructive Pulmonary Disease (COPD), Other, Chronic Kidney Disease/End Stage Renal Disease (ESRD)  [Parkinson's Disease, Neuropathy, Recurrent Depression & Generalized Anxiety Disorder]  General Interventions   General Interventions Discussed/Reviewed General Interventions Discussed, General Interventions Reviewed, Annual Eye Exam, Labs, Durable Medical Equipment (DME), Vaccines, Health Screening, Community Resources, Doctor Visits, Level of Care  Labs Hgb A1c annually  Vaccines COVID-19, Flu, Pneumonia, RSV, Shingles  Doctor Visits Discussed/Reviewed Doctor Visits Discussed, Doctor Visits Reviewed, Annual Wellness Visits, PCP, Specialist  Health Screening Colonoscopy, Bone Density, Mammogram  Durable Medical Equipment (DME) BP Cuff, Walker, Psychologist, forensic  PCP/Specialist Visits Compliance with follow-up visit  Level of Care Applications, Assisted Living, Personal Care Counsellor, Personal Care Services, Other  [facility] Health Financial Assistance Application]  Exercise Interventions   Exercise Discussed/Reviewed Exercise Discussed, Exercise Reviewed, Physical Activity, Assistive device use and maintanence  Physical Activity Discussed/Reviewed Physical Activity Discussed, Physical Activity Reviewed, Types of exercise  Education Interventions   Education Provided Provided Therapist, sports, Provided Education, Provided Web-based Education  Provided Verbal Education On Nutrition, Eye  Care, Mental Health/Coping with Illness, Applications, Exercise, Medication, Development worker, community, [employer], When to see the doctor  Applications Medicaid, Personal Care Services, Other  [employer] Health Financial Assistance Application]  Mental Health Interventions   Mental Health Discussed/Reviewed Mental Health Discussed, Mental Health Reviewed, Coping Strategies, Crisis, Anxiety, Depression  Nutrition Interventions   Nutrition Discussed/Reviewed Nutrition Discussed, Nutrition Reviewed, Fluid intake, Portion sizes, Decreasing sugar intake, Decreasing salt, Decreasing fats, Increaing proteins  Pharmacy Interventions   Pharmacy Dicussed/Reviewed Pharmacy Topics Discussed, Pharmacy Topics Reviewed, Medication Adherence, Affording Medications  Safety Interventions   Safety Discussed/Reviewed Safety Discussed, Safety Reviewed, Fall Risk  Advanced Directive Interventions   Advanced Directives Discussed/Reviewed Advanced Directives Discussed, Advanced Directives Reviewed     Active Listening & Reflection Utilized.  Verbalization of Feelings Encouraged.  Emotional Support Provided. Feelings of Caregiver Burnout Validated. Caregiver Stress Acknowledged. Caregiver Resources Reviewed. Caregiver Support Groups Mailed. Self-Enrollment in Caregiver Support Group of Interest Emphasized. Problem Solving Interventions Enacted. Solution-Focused Strategies Employed. Task-Centered Solutions Implemented. Acceptance & Commitment Therapy Initiated. Cognitive Behavioral Therapy Engaged. Client Centered Therapy Performed. CSW Collaboration with Daughter-in-Law, [name] to Confirm Discharge from [facility] (# [phone]) on 08/17/2022, to Return Home to Live Alone & Resume Home Health Services.  CSW Collaboration with Daughter-in-Law, [name] to [facility] Skilled Nursing, Physical Therapy & Mady Haagensen Aide Services to Resume, through [facility] [phone]). CSW Collaboration with Daughter-in-Law, [name] to Confirm Patient's Continued Disinterest in Receiving Placement into Higher Level of Care (I.e Memory Care Assisted Living Facility, Extended Care Facility, [facility], Etc.). CSW Collaboration with Daughter-in-Law, [name] to Assist with Completion of Medicaid Application & Encouraged Submission to The [facility] of Social Services [phone]), for Processing. CSW Collaboration with Daughter-in-Law, [name] to Encourage Review of Medicaid Application with  Patient to Ensure Accuracy & Assist with Obtaining Necessary Documents, Prior to Submitting Application to The [facility] of Social Services [phone]), for Processing. CSW Collaboration with Daughter-in-Law, [name] to Confirm Intent to [facility] Application & Necessary Documentation, to The [facility] of Social Services [phone]) for Processing, No Later than 08/20/2022.  CSW Collaboration with Daughter-in-Law, [name] to Encourage Completion of Application for Personal Care Services & Submission to Primary Care Provider, Family Nurse Practitioner, Harlow Mares with [facility] [phone]), for Review & Signature, Once Approved for Medicaid, through The [facility] of Social Services [phone]). CSW Collaboration with Daughter-in-Law, [name] to [employer] with CSW [phone]# [phone]), if She Has Questions, Needs Assistance with Application Completion & Submission, or If Additional Social Work Needs Are Identified Between Now & Our Next Scheduled Telephone Follow-Up [employer].      Our next appointment is by telephone on 08/31/2022 at 11:00 am.  Please call the care guide team at [phone] if you need to cancel or reschedule your appointment.   If you are experiencing a Mental Health or Behavioral  Health Crisis or need someone to talk to, please call the Suicide and Crisis Lifeline: 988 call the [place] National Suicide Prevention Lifeline: 914 010 5484 or TTY: 670-509-7238 TTY (718)144-5232) to talk to a trained counselor call 1-800-273-TALK (toll free, 24 hour hotline) go to [facility] [address], [place] [phone]) call the [facility] Crisis Line: (563)[phone] call 911  Patient verbalizes understanding of instructions and care plan provided today and agrees to view in MyChart. Active MyChart status and patient understanding of how to access instructions and care plan via MyChart confirmed with patient.     Telephone follow up appointment with care management team member scheduled for:  08/31/2022 at 11:00 am.  Danford Bad, BSW, MSW, LCSW  Licensed Clinical Social Worker  [facility] Corporate treasurer [facility] System  Mailing [place]. [address] [place], [place] [address] Physical Address-[address], [place], [place] [address] Toll Free Main # [phone] Fax # [phone] Cell # [phone] [email or web address]

## 2022-08-17 NOTE — Progress Notes (Signed)
Patient seen and evaluated this a.m. with no significant complaints or concerns noted.  She stayed an extra night for diuresis per cardiology recommendations.  She is otherwise feeling well with stable heart rate and blood pressure noted.  Please refer to discharge summary dictated 4/22 for full details.  She will now be having additional doses of Lasix twice daily for 3 more days and then can take the tablets as needed thereafter.  Cardiology follow-up has been arranged for 1 month from now.  Total care time: 30 minutes.

## 2022-08-18 ENCOUNTER — Ambulatory Visit: Payer: Self-pay | Admitting: *Deleted

## 2022-08-18 ENCOUNTER — Encounter: Payer: Self-pay | Admitting: *Deleted

## 2022-08-18 DIAGNOSIS — J9611 Chronic respiratory failure with hypoxia: Secondary | ICD-10-CM | POA: Diagnosis not present

## 2022-08-18 DIAGNOSIS — I959 Hypotension, unspecified: Secondary | ICD-10-CM | POA: Diagnosis not present

## 2022-08-18 DIAGNOSIS — I5043 Acute on chronic combined systolic (congestive) and diastolic (congestive) heart failure: Secondary | ICD-10-CM | POA: Diagnosis not present

## 2022-08-18 DIAGNOSIS — E669 Obesity, unspecified: Secondary | ICD-10-CM | POA: Diagnosis not present

## 2022-08-18 DIAGNOSIS — I480 Paroxysmal atrial fibrillation: Secondary | ICD-10-CM

## 2022-08-18 DIAGNOSIS — N390 Urinary tract infection, site not specified: Secondary | ICD-10-CM | POA: Diagnosis not present

## 2022-08-18 DIAGNOSIS — Z9181 History of falling: Secondary | ICD-10-CM | POA: Diagnosis not present

## 2022-08-18 DIAGNOSIS — J4489 Other specified chronic obstructive pulmonary disease: Secondary | ICD-10-CM | POA: Diagnosis not present

## 2022-08-18 DIAGNOSIS — F32A Depression, unspecified: Secondary | ICD-10-CM | POA: Diagnosis not present

## 2022-08-18 DIAGNOSIS — Z9981 Dependence on supplemental oxygen: Secondary | ICD-10-CM | POA: Diagnosis not present

## 2022-08-18 DIAGNOSIS — I5032 Chronic diastolic (congestive) heart failure: Secondary | ICD-10-CM

## 2022-08-18 DIAGNOSIS — F419 Anxiety disorder, unspecified: Secondary | ICD-10-CM | POA: Diagnosis not present

## 2022-08-18 DIAGNOSIS — I4819 Other persistent atrial fibrillation: Secondary | ICD-10-CM | POA: Diagnosis not present

## 2022-08-18 DIAGNOSIS — Z6833 Body mass index (BMI) 33.0-33.9, adult: Secondary | ICD-10-CM | POA: Diagnosis not present

## 2022-08-18 DIAGNOSIS — G20A1 Parkinson's disease without dyskinesia, without mention of fluctuations: Secondary | ICD-10-CM | POA: Diagnosis not present

## 2022-08-18 DIAGNOSIS — I110 Hypertensive heart disease with heart failure: Secondary | ICD-10-CM | POA: Diagnosis not present

## 2022-08-18 DIAGNOSIS — Z7901 Long term (current) use of anticoagulants: Secondary | ICD-10-CM | POA: Diagnosis not present

## 2022-08-18 NOTE — Chronic Care Management (AMB) (Signed)
   08/18/2022  Denyse Dago Teston 28-Nov-1950 [id number]   Ms. Porfirio Oar was discharged yesterday days ago after a hospital inpatient stay at [facility]. A Transition of Care Call was completed today.    Care Plan updates include:  Transition of care completed Reviewed medications  Reviewed discharge summary Verified upcoming scheduled appointments with patient  Please see Transitions of Care (Post Inpatient/ED Visit) note for details.   Irving Shows RNC, BSN RN Case Manager [facility] [phone]

## 2022-08-19 ENCOUNTER — Telehealth: Payer: Self-pay | Admitting: Family Medicine

## 2022-08-19 NOTE — Telephone Encounter (Signed)
DASH diet printed out and placed up front for pt to pick up and pt is aware.

## 2022-08-19 NOTE — Progress Notes (Signed)
    07/30/2022 Name: Allison Rollins MRN: [id number] DOB: 07/23/1950   Unsuccessful outreach to patient today Re: medication management  assistance.  Message sent to CCM RN and CMA for support to reschedule and advise.  It appears patient is currently in SNF.  Will plan to follow up as patient transitions home.    Kyrus Hyde Dattero Skylie Hiott, PharmD, BCACP Clinical Pharmacist, [facility]  

## 2022-08-23 ENCOUNTER — Ambulatory Visit: Payer: Medicare HMO | Admitting: *Deleted

## 2022-08-23 DIAGNOSIS — I10 Essential (primary) hypertension: Secondary | ICD-10-CM

## 2022-08-23 DIAGNOSIS — I5032 Chronic diastolic (congestive) heart failure: Secondary | ICD-10-CM

## 2022-08-23 DIAGNOSIS — I480 Paroxysmal atrial fibrillation: Secondary | ICD-10-CM

## 2022-08-23 NOTE — Chronic Care Management (AMB) (Signed)
Chronic Care Management   CCM RN Visit Note  08/23/2022 Name: Allison Rollins MRN: [id number] DOB: [birth year]  Subjective: Allison Rollins is a 72 y.o. year old female who is a primary care patient of Allison Earing, FNP. The patient was referred to the Chronic Care Management team for assistance with care management needs subsequent to provider initiation of CCM services and plan of care.    Today's Visit:  Engaged with patient by telephone for follow up visit.        Goals Addressed             This Visit's Progress    CCM (ATRIAL FIBRILLATION) EXPECTED OUTCOME: MONITOR, SELF-MANAGE AND REDUCE SYMPTOMS OF ATRIAL FIBRILLATION       Current Barriers:  Knowledge Deficits related to Atrial Fibrillation management Chronic Disease Management support and education needs related to Atrial Fibrillation Patient states recently diagnosed with Atrial Fibrillation, followed up at A-Fib clinic on 06/16/22, pt reports she is taking blood thinner as prescribed Patient reports she has all medications and taking as prescribed  Planned Interventions: Counseled on increased risk of stroke due to Afib and benefits of anticoagulation for stroke prevention           Reviewed importance of adherence to anticoagulant exactly as prescribed Counseled on importance of regular laboratory monitoring as prescribed Reviewed upcoming scheduled appointments A-Fib action plan reinforced  Symptom Management: Take medications as prescribed   Attend all scheduled provider appointments Call pharmacy for medication refills 3-7 days in advance of running out of medications Attend church or other social activities Perform all self care activities independently  Perform IADL's (shopping, preparing meals, housekeeping, managing finances) independently Call provider office for new concerns or questions  Work with the social worker to address care coordination needs and will continue to work with the clinical team to  address health care and disease management related needs - begin a symptom diary - bring symptom diary to all appointments - check pulse (heart) rate before taking medicine - check pulse (heart) rate once a day - make a plan to exercise regularly - make a plan to eat healthy - keep all lab appointments - take medicine as prescribed - wear medical alert identification Follow Atial Fibrillation action plan Continue working with social worker  Follow Up Plan: Telephone follow up appointment with care management team member scheduled for:   09/07/22 at 130 pm       CCM (CHRONIC KIDNEY DISEASE) EXPECTED OUTCOME: MONITOR, SELF-MANAGE AND REDUCE SYMPTOMS OF CHRONIC KIDNEY DISEASE       Current Barriers:  Knowledge Deficits related to Chronic Kidney Disease Chronic Disease Management support and education needs related to management of Chronic Kidney Disease No Advanced Directives in place- pt declines Patient reports she tries to eat as healthy as she can, does do some light exercise when she feels able and with physical therapist, blood pressure is checked by home health physical therapist, patient weighs daily with weight today 203 pounds Patient reports she is taking diuretic as prescribed Patient reports she cancelled her primary care provider appointment because she is tired and weak, states she will reschedule when she feels stronger  Planned Interventions: Evaluation of current treatment plan related to chronic kidney disease self management and patient's adherence to plan as established by provider      Provided education to patient re: stroke prevention, s/s of heart attack and stroke    Reviewed prescribed diet heart healthy, low sodium Reviewed medications with  patient and discussed importance of compliance    Counseled on the importance of exercise goals with target of 150 minutes per week     Advised patient, providing education and rationale, to monitor blood pressure daily and  record, calling PCP for findings outside established parameters    Discussed complications of poorly controlled blood pressure such as heart disease, stroke, circulatory complications, vision complications, kidney impairment, sexual dysfunction    Reviewed importance of taking all medications as prescribed Reinforced with pt to call into primary care provider office if she is low on any medication and cannot afford refill Encouraged pt to reschedule her primary care provider appointment  Symptom Management: Take medications as prescribed   Attend all scheduled provider appointments Call pharmacy for medication refills 3-7 days in advance of running out of medications Attend church or other social activities Perform all self care activities independently  Perform IADL's (shopping, preparing meals, housekeeping, managing finances) independently Call provider office for new concerns or questions  It is important to keep your blood pressure under good control Choose water as your main beverage Try to walk some everyday- get outside weather permitting Please reschedule your primary care provider appointment  Follow Up Plan: Telephone follow up appointment with care management team member scheduled for:  09/07/22 at 130 pm       CCM (HYPERTENSION) EXPECTED OUTCOME: MONITOR, SELF-MANAGE AND REDUCE SYMPTOMS OF HYPERTENSION       Current Barriers:  Knowledge Deficits related to Hypertension Chronic Disease Management support and education needs related to management of Hypertension and diet No Advanced Directives in place- patient declines Patient reports she lives alone, has adult son nearby and another relative than can assist her if needed, pt drives and is overall independent, states she does not like to go up steps, takes extra time,  pt reports she would like LCSW to outreach her for depression management PHQ9=7 and for interpersonal family relationship difficulties, pt states she gets 23$ per  month food stamps and does not always have enough money to buy food Patient reports she does not monitor blood pressure consistently Patient continues to work with LCSW  Planned Interventions: Evaluation of current treatment plan related to hypertension self management and patient's adherence to plan as established by provider;   Provided education to patient re: stroke prevention, s/s of heart attack and stroke; Reviewed prescribed diet low sodium Reviewed medications with patient and discussed importance of compliance;  Counseled on the importance of exercise goals with target of 150 minutes per week Discussed plans with patient for ongoing care management follow up and provided patient with direct contact information for care management team; Advised patient, providing education and rationale, to monitor blood pressure daily and record, calling PCP for findings outside established parameters;  Discussed complications of poorly controlled blood pressure such as heart disease, stroke, circulatory complications, vision complications, kidney impairment, sexual dysfunction;   Symptom Management: Take medications as prescribed   Attend all scheduled provider appointments Call pharmacy for medication refills 3-7 days in advance of running out of medications Attend church or other social activities Perform all self care activities independently  Perform IADL's (shopping, preparing meals, housekeeping, managing finances) independently Call provider office for new concerns or questions  check blood pressure 3 times per week choose a place to take my blood pressure (home, clinic or office, retail store) write blood pressure results in a log or diary keep a blood pressure log take blood pressure log to all doctor appointments take medications  for blood pressure exactly as prescribed report new symptoms to your doctor eat more whole grains, fruits and vegetables, lean meats and healthy  fats Follow low sodium diet- read food labels for sodium content Limit fast food  Follow Up Plan: Telephone follow up appointment with care management team member scheduled for:   09/07/22 at 130 pm          Plan:Telephone follow up appointment with care management team member scheduled for:  09/07/22 at 130 pm  Irving Shows [facility], BSN RN Case Manager [facility] [phone]

## 2022-08-23 NOTE — Patient Instructions (Signed)
Please call the care guide team at [phone] if you need to cancel or reschedule your appointment.   If you are experiencing a Mental Health or Behavioral Health Crisis or need someone to talk to, please call the Suicide and Crisis Lifeline: 988 call the [place] National Suicide Prevention Lifeline: (970)150-2658 or TTY: (419)860-5185 TTY 925-513-3336) to talk to a trained counselor call 1-800-273-TALK (toll free, 24 hour hotline) go to [facility] [address], [place] [phone]) call the [facility]): [phone] call 911   Following is a copy of the CCM Program Consent:  CCM service includes personalized support from designated clinical staff supervised by the physician, including individualized plan of care and coordination with other care providers 24/7 contact phone numbers for assistance for urgent and routine care needs. Service will only be billed when office clinical staff spend 20 minutes or more in a month to coordinate care. Only one practitioner may furnish and bill the service in a calendar month. The patient may stop CCM services at amy time (effective at the end of the month) by phone call to the office staff. The patient will be responsible for cost sharing (co-pay) or up to 20% of the service fee (after annual deductible is met)  Following is a copy of your full provider care plan:   Goals Addressed             This Visit's Progress    CCM (ATRIAL FIBRILLATION) EXPECTED OUTCOME: MONITOR, SELF-MANAGE AND REDUCE SYMPTOMS OF ATRIAL FIBRILLATION       Current Barriers:  Knowledge Deficits related to Atrial Fibrillation management Chronic Disease Management support and education needs related to Atrial Fibrillation Patient states recently diagnosed with Atrial Fibrillation, followed up at A-Fib clinic on 06/16/22, pt reports she is taking blood thinner as prescribed Patient reports she has all medications and  taking as prescribed  Planned Interventions: Counseled on increased risk of stroke due to Afib and benefits of anticoagulation for stroke prevention           Reviewed importance of adherence to anticoagulant exactly as prescribed Counseled on importance of regular laboratory monitoring as prescribed Reviewed upcoming scheduled appointments A-Fib action plan reinforced  Symptom Management: Take medications as prescribed   Attend all scheduled provider appointments Call pharmacy for medication refills 3-7 days in advance of running out of medications Attend church or other social activities Perform all self care activities independently  Perform IADL's (shopping, preparing meals, housekeeping, managing finances) independently Call provider office for new concerns or questions  Work with the social worker to address care coordination needs and will continue to work with the clinical team to address health care and disease management related needs - begin a symptom diary - bring symptom diary to all appointments - check pulse (heart) rate before taking medicine - check pulse (heart) rate once a day - make a plan to exercise regularly - make a plan to eat healthy - keep all lab appointments - take medicine as prescribed - wear medical alert identification Follow Atial Fibrillation action plan Continue working with social worker  Follow Up Plan: Telephone follow up appointment with care management team member scheduled for:   09/07/22 at 130 pm       CCM (CHRONIC KIDNEY DISEASE) EXPECTED OUTCOME: MONITOR, SELF-MANAGE AND REDUCE SYMPTOMS OF CHRONIC KIDNEY DISEASE       Current Barriers:  Knowledge Deficits related to Chronic Kidney Disease Chronic Disease Management support and education needs related  to management of Chronic Kidney Disease No Advanced Directives in place- pt declines Patient reports she tries to eat as healthy as she can, does do some light exercise when she feels  able and with physical therapist, blood pressure is checked by home health physical therapist, patient weighs daily with weight today 203 pounds Patient reports she is taking diuretic as prescribed Patient reports she cancelled her primary care provider appointment because she is tired and weak, states she will reschedule when she feels stronger  Planned Interventions: Evaluation of current treatment plan related to chronic kidney disease self management and patient's adherence to plan as established by provider      Provided education to patient re: stroke prevention, s/s of heart attack and stroke    Reviewed prescribed diet heart healthy, low sodium Reviewed medications with patient and discussed importance of compliance    Counseled on the importance of exercise goals with target of 150 minutes per week     Advised patient, providing education and rationale, to monitor blood pressure daily and record, calling PCP for findings outside established parameters    Discussed complications of poorly controlled blood pressure such as heart disease, stroke, circulatory complications, vision complications, kidney impairment, sexual dysfunction    Reviewed importance of taking all medications as prescribed Reinforced with pt to call into primary care provider office if she is low on any medication and cannot afford refill Encouraged pt to reschedule her primary care provider appointment  Symptom Management: Take medications as prescribed   Attend all scheduled provider appointments Call pharmacy for medication refills 3-7 days in advance of running out of medications Attend church or other social activities Perform all self care activities independently  Perform IADL's (shopping, preparing meals, housekeeping, managing finances) independently Call provider office for new concerns or questions  It is important to keep your blood pressure under good control Choose water as your main beverage Try to  walk some everyday- get outside weather permitting Please reschedule your primary care provider appointment  Follow Up Plan: Telephone follow up appointment with care management team member scheduled for:  09/07/22 at 130 pm       CCM (HYPERTENSION) EXPECTED OUTCOME: MONITOR, SELF-MANAGE AND REDUCE SYMPTOMS OF HYPERTENSION       Current Barriers:  Knowledge Deficits related to Hypertension Chronic Disease Management support and education needs related to management of Hypertension and diet No Advanced Directives in place- patient declines Patient reports she lives alone, has adult son nearby and another relative than can assist her if needed, pt drives and is overall independent, states she does not like to go up steps, takes extra time,  pt reports she would like LCSW to outreach her for depression management PHQ9=7 and for interpersonal family relationship difficulties, pt states she gets 23$ per month food stamps and does not always have enough money to buy food Patient reports she does not monitor blood pressure consistently Patient continues to work with LCSW  Planned Interventions: Evaluation of current treatment plan related to hypertension self management and patient's adherence to plan as established by provider;   Provided education to patient re: stroke prevention, s/s of heart attack and stroke; Reviewed prescribed diet low sodium Reviewed medications with patient and discussed importance of compliance;  Counseled on the importance of exercise goals with target of 150 minutes per week Discussed plans with patient for ongoing care management follow up and provided patient with direct contact information for care management team; Advised patient, providing education and rationale,  to monitor blood pressure daily and record, calling PCP for findings outside established parameters;  Discussed complications of poorly controlled blood pressure such as heart disease, stroke, circulatory  complications, vision complications, kidney impairment, sexual dysfunction;   Symptom Management: Take medications as prescribed   Attend all scheduled provider appointments Call pharmacy for medication refills 3-7 days in advance of running out of medications Attend church or other social activities Perform all self care activities independently  Perform IADL's (shopping, preparing meals, housekeeping, managing finances) independently Call provider office for new concerns or questions  check blood pressure 3 times per week choose a place to take my blood pressure (home, clinic or office, retail store) write blood pressure results in a log or diary keep a blood pressure log take blood pressure log to all doctor appointments take medications for blood pressure exactly as prescribed report new symptoms to your doctor eat more whole grains, fruits and vegetables, lean meats and healthy fats Follow low sodium diet- read food labels for sodium content Limit fast food  Follow Up Plan: Telephone follow up appointment with care management team member scheduled for:   09/07/22 at 130 pm          The patient verbalized understanding of instructions, educational materials, and care plan provided today and DECLINED offer to receive copy of patient instructions, educational materials, and care plan.  Telephone follow up appointment with care management team member scheduled for:  09/07/22 at 130 pm

## 2022-08-24 ENCOUNTER — Inpatient Hospital Stay (HOSPITAL_COMMUNITY)
Admission: EM | Admit: 2022-08-24 | Discharge: 2022-08-28 | DRG: 308 | Disposition: A | Discharge status: Home Health | Payer: Medicare HMO | Attending: Family Medicine | Admitting: Family Medicine

## 2022-08-24 ENCOUNTER — Emergency Department (HOSPITAL_COMMUNITY): Payer: Medicare HMO

## 2022-08-24 DIAGNOSIS — Z888 Allergy status to other drugs, medicaments and biological substances status: Secondary | ICD-10-CM

## 2022-08-24 DIAGNOSIS — Z6833 Body mass index (BMI) 33.0-33.9, adult: Secondary | ICD-10-CM | POA: Diagnosis not present

## 2022-08-24 DIAGNOSIS — I5043 Acute on chronic combined systolic (congestive) and diastolic (congestive) heart failure: Secondary | ICD-10-CM | POA: Diagnosis present

## 2022-08-24 DIAGNOSIS — R079 Chest pain, unspecified: Secondary | ICD-10-CM | POA: Diagnosis not present

## 2022-08-24 DIAGNOSIS — I4891 Unspecified atrial fibrillation: Secondary | ICD-10-CM | POA: Diagnosis not present

## 2022-08-24 DIAGNOSIS — Z79899 Other long term (current) drug therapy: Secondary | ICD-10-CM | POA: Diagnosis not present

## 2022-08-24 DIAGNOSIS — Z87891 Personal history of nicotine dependence: Secondary | ICD-10-CM | POA: Diagnosis not present

## 2022-08-24 DIAGNOSIS — Z91012 Allergy to eggs: Secondary | ICD-10-CM

## 2022-08-24 DIAGNOSIS — F411 Generalized anxiety disorder: Secondary | ICD-10-CM | POA: Diagnosis present

## 2022-08-24 DIAGNOSIS — I110 Hypertensive heart disease with heart failure: Secondary | ICD-10-CM | POA: Diagnosis present

## 2022-08-24 DIAGNOSIS — Z743 Need for continuous supervision: Secondary | ICD-10-CM | POA: Diagnosis not present

## 2022-08-24 DIAGNOSIS — Z7901 Long term (current) use of anticoagulants: Secondary | ICD-10-CM | POA: Diagnosis not present

## 2022-08-24 DIAGNOSIS — Z9981 Dependence on supplemental oxygen: Secondary | ICD-10-CM | POA: Diagnosis not present

## 2022-08-24 DIAGNOSIS — I5023 Acute on chronic systolic (congestive) heart failure: Secondary | ICD-10-CM | POA: Diagnosis not present

## 2022-08-24 DIAGNOSIS — I4819 Other persistent atrial fibrillation: Principal | ICD-10-CM | POA: Diagnosis present

## 2022-08-24 DIAGNOSIS — R7303 Prediabetes: Secondary | ICD-10-CM | POA: Diagnosis not present

## 2022-08-24 DIAGNOSIS — E782 Mixed hyperlipidemia: Secondary | ICD-10-CM

## 2022-08-24 DIAGNOSIS — I340 Nonrheumatic mitral (valve) insufficiency: Secondary | ICD-10-CM | POA: Diagnosis not present

## 2022-08-24 DIAGNOSIS — R0602 Shortness of breath: Secondary | ICD-10-CM | POA: Diagnosis not present

## 2022-08-24 DIAGNOSIS — F419 Anxiety disorder, unspecified: Secondary | ICD-10-CM | POA: Diagnosis not present

## 2022-08-24 DIAGNOSIS — Z825 Family history of asthma and other chronic lower respiratory diseases: Secondary | ICD-10-CM | POA: Diagnosis not present

## 2022-08-24 DIAGNOSIS — I951 Orthostatic hypotension: Secondary | ICD-10-CM | POA: Diagnosis not present

## 2022-08-24 DIAGNOSIS — J449 Chronic obstructive pulmonary disease, unspecified: Secondary | ICD-10-CM | POA: Diagnosis present

## 2022-08-24 DIAGNOSIS — R069 Unspecified abnormalities of breathing: Secondary | ICD-10-CM | POA: Diagnosis not present

## 2022-08-24 DIAGNOSIS — E669 Obesity, unspecified: Secondary | ICD-10-CM | POA: Diagnosis present

## 2022-08-24 DIAGNOSIS — Z833 Family history of diabetes mellitus: Secondary | ICD-10-CM | POA: Diagnosis not present

## 2022-08-24 DIAGNOSIS — Z880 Allergy status to penicillin: Secondary | ICD-10-CM | POA: Diagnosis not present

## 2022-08-24 DIAGNOSIS — N1831 Chronic kidney disease, stage 3a: Secondary | ICD-10-CM

## 2022-08-24 DIAGNOSIS — J9611 Chronic respiratory failure with hypoxia: Secondary | ICD-10-CM | POA: Diagnosis not present

## 2022-08-24 DIAGNOSIS — Z8249 Family history of ischemic heart disease and other diseases of the circulatory system: Secondary | ICD-10-CM

## 2022-08-24 DIAGNOSIS — J4489 Other specified chronic obstructive pulmonary disease: Secondary | ICD-10-CM | POA: Diagnosis present

## 2022-08-24 DIAGNOSIS — F32A Depression, unspecified: Secondary | ICD-10-CM | POA: Diagnosis present

## 2022-08-24 DIAGNOSIS — F339 Major depressive disorder, recurrent, unspecified: Secondary | ICD-10-CM

## 2022-08-24 DIAGNOSIS — I959 Hypotension, unspecified: Secondary | ICD-10-CM | POA: Diagnosis not present

## 2022-08-24 DIAGNOSIS — I10 Essential (primary) hypertension: Secondary | ICD-10-CM | POA: Diagnosis not present

## 2022-08-24 DIAGNOSIS — R0689 Other abnormalities of breathing: Secondary | ICD-10-CM | POA: Diagnosis not present

## 2022-08-24 DIAGNOSIS — R9389 Abnormal findings on diagnostic imaging of other specified body structures: Secondary | ICD-10-CM | POA: Diagnosis not present

## 2022-08-24 DIAGNOSIS — K2100 Gastro-esophageal reflux disease with esophagitis, without bleeding: Secondary | ICD-10-CM | POA: Diagnosis not present

## 2022-08-24 DIAGNOSIS — N390 Urinary tract infection, site not specified: Secondary | ICD-10-CM | POA: Diagnosis not present

## 2022-08-24 DIAGNOSIS — R197 Diarrhea, unspecified: Secondary | ICD-10-CM | POA: Diagnosis present

## 2022-08-24 DIAGNOSIS — I5041 Acute combined systolic (congestive) and diastolic (congestive) heart failure: Secondary | ICD-10-CM | POA: Diagnosis present

## 2022-08-24 DIAGNOSIS — E785 Hyperlipidemia, unspecified: Secondary | ICD-10-CM | POA: Diagnosis present

## 2022-08-24 DIAGNOSIS — R0902 Hypoxemia: Secondary | ICD-10-CM | POA: Diagnosis not present

## 2022-08-24 DIAGNOSIS — Z882 Allergy status to sulfonamides status: Secondary | ICD-10-CM | POA: Diagnosis not present

## 2022-08-24 DIAGNOSIS — G20A1 Parkinson's disease without dyskinesia, without mention of fluctuations: Secondary | ICD-10-CM | POA: Diagnosis not present

## 2022-08-24 DIAGNOSIS — I129 Hypertensive chronic kidney disease with stage 1 through stage 4 chronic kidney disease, or unspecified chronic kidney disease: Secondary | ICD-10-CM

## 2022-08-24 DIAGNOSIS — Z5986 Financial insecurity: Secondary | ICD-10-CM

## 2022-08-24 DIAGNOSIS — G2581 Restless legs syndrome: Secondary | ICD-10-CM

## 2022-08-24 DIAGNOSIS — Z9181 History of falling: Secondary | ICD-10-CM | POA: Diagnosis not present

## 2022-08-24 HISTORY — DX: Parkinsonism, unspecified: G20.C

## 2022-08-24 HISTORY — DX: Dependence on supplemental oxygen: Z99.81

## 2022-08-24 HISTORY — DX: Orthostatic hypotension: I95.1

## 2022-08-24 LAB — CBC WITH DIFFERENTIAL/PLATELET
Abs Immature Granulocytes: 0.02 10*3/uL (ref 0.00–0.07)
Basophils Absolute: 0 10*3/uL (ref 0.0–0.1)
Basophils Relative: 0 %
Eosinophils Absolute: 0.2 10*3/uL (ref 0.0–0.5)
Eosinophils Relative: 2 %
HCT: 40.4 % (ref 36.0–46.0)
Hemoglobin: 13.1 g/dL (ref 12.0–15.0)
Immature Granulocytes: 0 %
Lymphocytes Relative: 12 %
Lymphs Abs: 1 10*3/uL (ref 0.7–4.0)
MCH: 29.9 pg (ref 26.0–34.0)
MCHC: 32.4 g/dL (ref 30.0–36.0)
MCV: 92.2 fL (ref 80.0–100.0)
Monocytes Absolute: 0.6 10*3/uL (ref 0.1–1.0)
Monocytes Relative: 8 %
Neutro Abs: 6.2 10*3/uL (ref 1.7–7.7)
Neutrophils Relative %: 78 %
Platelets: 198 10*3/uL (ref 150–400)
RBC: 4.38 MIL/uL (ref 3.87–5.11)
RDW: 15.3 % (ref 11.5–15.5)
WBC: 8 10*3/uL (ref 4.0–10.5)
nRBC: 0 % (ref 0.0–0.2)

## 2022-08-24 LAB — COMPREHENSIVE METABOLIC PANEL
ALT: 16 U/L (ref 0–44)
AST: 20 U/L (ref 15–41)
Albumin: 3.3 g/dL — ABNORMAL LOW (ref 3.5–5.0)
Alkaline Phosphatase: 71 U/L (ref 38–126)
Anion gap: 13 (ref 5–15)
BUN: 14 mg/dL (ref 8–23)
CO2: 27 mmol/L (ref 22–32)
Calcium: 8.9 mg/dL (ref 8.9–10.3)
Chloride: 100 mmol/L (ref 98–111)
Creatinine, Ser: 1.13 mg/dL — ABNORMAL HIGH (ref 0.44–1.00)
GFR, Estimated: 52 mL/min — ABNORMAL LOW (ref >60)
Glucose, Bld: 99 mg/dL (ref 70–99)
Potassium: 4 mmol/L (ref 3.5–5.1)
Sodium: 140 mmol/L (ref 135–145)
Total Bilirubin: 1.6 mg/dL — ABNORMAL HIGH (ref 0.3–1.2)
Total Protein: 6.5 g/dL (ref 6.5–8.1)

## 2022-08-24 LAB — PROTIME-INR
INR: 1.3 — ABNORMAL HIGH (ref 0.8–1.2)
Prothrombin Time: 16 seconds — ABNORMAL HIGH (ref 11.4–15.2)

## 2022-08-24 LAB — TROPONIN I (HIGH SENSITIVITY): Troponin I (High Sensitivity): 12 ng/L (ref <18)

## 2022-08-24 LAB — BRAIN NATRIURETIC PEPTIDE: B Natriuretic Peptide: 1029 pg/mL — ABNORMAL HIGH (ref 0.0–100.0)

## 2022-08-24 MED ORDER — DILTIAZEM HCL-DEXTROSE 125-5 MG/125ML-% IV SOLN (PREMIX)
5.0000 mg/h | INTRAVENOUS | Status: DC
  Administered 2022-08-24: 5 mg/h via INTRAVENOUS
  Filled 2022-08-24: qty 125

## 2022-08-24 MED ORDER — FUROSEMIDE 10 MG/ML IJ SOLN
40.0000 mg | INTRAMUSCULAR | Status: AC
  Administered 2022-08-24: 40 mg via INTRAVENOUS
  Filled 2022-08-24: qty 4

## 2022-08-24 NOTE — ED Triage Notes (Signed)
Pt hasn't been taking meds as she should due to lack of knowledge and home health has not come out to house yet  Hasn't took her lasix or at fib med since being discharged

## 2022-08-24 NOTE — ED Notes (Signed)
Pt was cleaned up and changed. Pt has a clean bed, brief and placed a purewick. Pt is resting at this time.

## 2022-08-24 NOTE — ED Provider Notes (Signed)
[place] EMERGENCY DEPARTMENT AT [facility] Provider Note   CSN: [id number] Arrival date & time: 08/24/22  2119     History  Chief Complaint  Patient presents with   Shortness of Breath    Allison Rollins is a 72 y.o. female.   Shortness of Breath    This patient is a 72 year old female with a known history of congestive heart failure, she is supposed be taking furosemide but admits that she does not take it as regularly as she should.  The patient was in the hospital on April 16 for a chronic congestive heart failure exacerbation.  Prior to that she had been in with atrial fibrillation with RVR in March on the 16th, prior to that she had been seen in the ED multiple times for paroxysmal A-fib.  She is on Eliquis, the last echocardiogram was performed on July 11, 2022 and showed an ejection fraction of 40 to 45%, indeterminate left ventricular diastolic parameters.  There was global hypokinesis  She reports that she has had some increasing shortness of breath over the last couple of days although she reports that since leaving the hospital several weeks ago she has been almost persistently short of breath.  Over the last couple of days she has noticed increasing swelling of her legs, increasing tightness of her close, feeling very short of breath with laying down or ambulating.  She is on oxygen at 3 L chronically.  She does report that she did not take her Lasix today but did take it today before she came in.  Home Medications Prior to Admission medications   Medication Sig Start Date End Date Taking? Authorizing Provider  albuterol (PROVENTIL) (2.5 MG/3ML) 0.083% nebulizer solution Take 3 mLs (2.5 mg total) by nebulization every 6 (six) hours as needed for wheezing or shortness of breath. 01/12/18   Sonny Masters, FNP  albuterol (VENTOLIN HFA) 108 (90 Base) MCG/ACT inhaler Inhale 2 puffs into the lungs every 6 (six) hours as needed for wheezing or shortness of breath.  08/18/20   Gabriel Earing, FNP  ALLERGY RELIEF CETIRIZINE 10 MG tablet Take 1 tablet by mouth every day 01/08/22   Gwenlyn Fudge, FNP  alum & mag hydroxide-simeth (MAALOX/MYLANTA) 200-200-20 MG/5ML suspension Take 30 mLs by mouth every 6 (six) hours as needed for indigestion or heartburn. 07/19/22   Shahmehdi, Gemma Payor, MD  amiodarone (PACERONE) 200 MG tablet Take 1 tablet (200 mg total) by mouth daily. 07/26/22 09/24/22  Shahmehdi, Gemma Payor, MD  apixaban (ELIQUIS) 5 MG TABS tablet Take 1 tablet (5 mg total) by mouth 2 (two) times daily. 07/07/22 08/10/22  Newman Nip, NP  Ascorbic Acid (VITAMIN C PO) Take 1 tablet by mouth 3 (three) times daily.    [provider]  atorvastatin (LIPITOR) 40 MG tablet Take 1 tablet by mouth every day 08/02/22   Gabriel Earing, FNP  CALCIUM PO Take 1 tablet by mouth daily.    [provider]  cholecalciferol (VITAMIN D3) 25 MCG (1000 UT) tablet Take 1,000 Units by mouth daily.    [provider]  famotidine (PEPCID) 20 MG tablet Take 20 mg by mouth daily. 07/29/22   [provider]  fluticasone (FLONASE) 50 MCG/ACT nasal spray Use 2 sprays into each nostril every day Patient taking differently: Place 2 sprays into both nostrils daily. 05/09/22   Gabriel Earing, FNP  furosemide (LASIX) 20 MG tablet Take twice a day for 3 days and then  as needed for swelling or shortness of breath. 08/17/22   Sherryll Burger, Pratik D, DO  gabapentin (NEURONTIN) 300 MG capsule Take 300 mg by mouth See admin instructions. Take 600 mg by mouth three times daily and at bedtime. 07/29/22   [provider]  glucose blood (ACCU-CHEK AVIVA PLUS) test strip Test sugars daily 02/29/20   Dettinger, Elige Radon, MD  metoprolol tartrate (LOPRESSOR) 25 MG tablet Take 0.5 tablets (12.5 mg total) by mouth 2 (two) times daily. 08/17/22 09/16/22  Sherryll Burger, Pratik D, DO  midodrine (PROAMATINE) 10 MG tablet Take 1 tablet (10 mg total) by mouth 3 (three) times daily with meals.  08/16/22 10/15/22  Sherryll Burger, Pratik D, DO  nitroGLYCERIN (NITROSTAT) 0.4 MG SL tablet Place 1 tablet (0.4 mg total) under the tongue every 5 (five) minutes as needed for chest pain. 12/25/15   [name], MD  pantoprazole (PROTONIX) 40 MG tablet Take 1 tablet by mouth twice daily 08/02/22   Gabriel Earing, FNP  pramipexole (MIRAPEX) 0.5 MG tablet Take 1 tablet by mouth at bedtime 06/02/22   Gabriel Earing, FNP  sertraline (ZOLOFT) 100 MG tablet Take 2 tablets (200 mg total) by mouth daily. 02/24/22   Gabriel Earing, FNP      Allergies    Egg solids, whole; Farxiga [dapagliflozin]; Penicillins; and Sulfa antibiotics    Review of Systems   Review of Systems  Respiratory:  Positive for shortness of breath.   All other systems reviewed and are negative.   Physical Exam Updated Vital Signs BP (!) 125/96   Pulse (!) 121   Temp 97.8 F (36.6 C)   Resp (!) 23   Ht 1.651 m (5\' 5" )   Wt 91.2 kg   SpO2 97%   BMI 33.45 kg/m  Physical Exam Vitals and nursing note reviewed.  Constitutional:      General: She is in acute distress.     Appearance: She is well-developed.  HENT:     Head: Normocephalic and atraumatic.     Mouth/Throat:     Pharynx: No oropharyngeal exudate.  Eyes:     General: No scleral icterus.       Right eye: No discharge.        Left eye: No discharge.     Conjunctiva/sclera: Conjunctivae normal.     Pupils: Pupils are equal, round, and reactive to light.  Neck:     Thyroid: No thyromegaly.     Vascular: No JVD.  Cardiovascular:     Rate and Rhythm: Tachycardia present. Rhythm irregular.     Heart sounds: Normal heart sounds. No murmur heard.    No friction rub. No gallop.  Pulmonary:     Effort: Pulmonary effort is normal. No respiratory distress.     Breath sounds: Normal breath sounds. No wheezing or rales.  Abdominal:     General: Bowel sounds are normal. There is no distension.     Palpations: Abdomen is soft. There is no mass.     Tenderness: There  is no abdominal tenderness.  Musculoskeletal:        General: No tenderness. Normal range of motion.     Cervical back: Normal range of motion and neck supple.     Right lower leg: Edema present.     Left lower leg: Edema present.     Comments: Significant bilateral lower extremity edema  Lymphadenopathy:     Cervical: No cervical adenopathy.  Skin:    General: Skin is warm and  dry.     Findings: No erythema or rash.  Neurological:     General: No focal deficit present.     Mental Status: She is alert.     Coordination: Coordination normal.  Psychiatric:        Behavior: Behavior normal.     ED Results / Procedures / Treatments   Labs (all labs ordered are listed, but only abnormal results are displayed) Labs Reviewed  BRAIN NATRIURETIC PEPTIDE - Abnormal; Notable for the following components:      Result Value   B Natriuretic Peptide 1,029.0 (*)    All other components within normal limits  COMPREHENSIVE METABOLIC PANEL - Abnormal; Notable for the following components:   Creatinine, Ser 1.13 (*)    Albumin 3.3 (*)    Total Bilirubin 1.6 (*)    GFR, Estimated 52 (*)    All other components within normal limits  PROTIME-INR - Abnormal; Notable for the following components:   Prothrombin Time 16.0 (*)    INR 1.3 (*)    All other components within normal limits  CBC WITH DIFFERENTIAL/PLATELET  TROPONIN I (HIGH SENSITIVITY)    EKG EKG Interpretation  Date/Time:  Tuesday August 24 2022 21:28:59 EDT Ventricular Rate:  127 PR Interval:    QRS Duration: 90 QT Interval:  333 QTC Calculation: 484 R Axis:   82 Text Interpretation: Atrial fibrillation Ventricular premature complex Borderline right axis deviation Low voltage, precordial leads Anteroseptal infarct, old Borderline repolarization abnormality Since last tracing rate faster Confirmed by Eber Hong ([id number]) on 08/24/2022 9:32:12 PM  Radiology DG Chest Port 1 View  Result Date: 08/24/2022 CLINICAL DATA:   Shortness of breath and chest pain EXAM: PORTABLE CHEST 1 VIEW COMPARISON:  08/10/2022 FINDINGS: Cardiac shadow is enlarged but stable. Aortic calcifications are again seen. Chronic blunting of the left costophrenic angle is noted. Increasing left retrocardiac airspace opacity is noted consistent with early infiltrate. No bony abnormality is noted. IMPRESSION: Increasing left retrocardiac infiltrate. Electronically Signed   By: Alcide Clever M.D.   On: 08/24/2022 23:00    Procedures .Critical Care  Performed by: Eber Hong, MD Authorized by: Eber Hong, MD   Critical care provider statement:    Critical care time (minutes):  45   Critical care time was exclusive of:  Separately billable procedures and treating other patients   Critical care was necessary to treat or prevent imminent or life-threatening deterioration of the following conditions:  Respiratory failure and circulatory failure   Critical care was time spent personally by me on the following activities:  Development of treatment plan with patient or surrogate, discussions with consultants, evaluation of patient's response to treatment, examination of patient, obtaining history from patient or surrogate, review of old charts, re-evaluation of patient's condition, pulse oximetry, ordering and review of radiographic studies, ordering and review of laboratory studies and ordering and performing treatments and interventions   I assumed direction of critical care for this patient from another provider in my specialty: no     Care discussed with: admitting provider   Comments:           Medications Ordered in ED Medications  diltiazem (CARDIZEM) 125 mg in dextrose 5% 125 mL (1 mg/mL) infusion (5 mg/hr Intravenous New Bag/Given 08/24/22 2244)  furosemide (LASIX) injection 40 mg (40 mg Intravenous Given 08/24/22 2239)    ED Course/ Medical Decision Making/ A&P  Medical Decision Making Amount and/or  Complexity of Data Reviewed Labs: ordered. Radiology: ordered.  Risk Prescription drug management.    This patient presents to the ED for concern of shortness of breath, this involves an extensive number of treatment options, and is a complaint that carries with it a high risk of complications and morbidity.  The differential diagnosis includes likely diagnosis of congestive heart failure but also has atrial fibrillation with RVR, the chicken or the egg.  Would also consider pneumonia pneumothorax or acute ischemia   Co morbidities that complicate the patient evaluation  Known congestive heart failure, please see echocardiogram reports in the history   Additional history obtained:  Additional history obtained from medical record External records from outside source obtained and reviewed including recent hospitalization and echocardiogram   Lab Tests:  I Ordered, and personally interpreted labs.  The pertinent results include:  BNP elevated > 1000, CBC, BMP unremarkable - Trop neg   Imaging Studies ordered:  I ordered imaging studies including CXR  I independently visualized and interpreted imaging which showed possible infiltrated I agree with the radiologist interpretation   Cardiac Monitoring: / EKG:  The patient was maintained on a cardiac monitor.  I personally viewed and interpreted the cardiac monitored which showed an underlying rhythm of: afib with rvr   Consultations Obtained:  At change of shift - care signed out to Dr. Judd Lien to f/u results and dispo accordingly.   Problem List / ED Course / Critical interventions / Medication management  Acutely ill appearing, afib with rvr and CHF Needs admit for diuresis I ordered medication including lasix, dilt drip  for afib  Reevaluation of the patient after these medicines showed that the patient stayed the same I have reviewed the patients home medicines and have made adjustments as needed   Social Determinants  of Health:  Critically  ill   Test / Admission - Considered:  Admit to hosptial.         Final Clinical Impression(s) / ED Diagnoses Final diagnoses:  Atrial fibrillation with rapid ventricular response [facility])    Rx / DC Orders ED Discharge Orders     None         Eber Hong, MD 08/24/22 2307

## 2022-08-24 NOTE — ED Notes (Signed)
Pt watching TV   resting well

## 2022-08-25 ENCOUNTER — Other Ambulatory Visit: Payer: Self-pay

## 2022-08-25 ENCOUNTER — Encounter (HOSPITAL_COMMUNITY): Payer: Self-pay | Admitting: Family Medicine

## 2022-08-25 DIAGNOSIS — F411 Generalized anxiety disorder: Secondary | ICD-10-CM

## 2022-08-25 DIAGNOSIS — J9611 Chronic respiratory failure with hypoxia: Secondary | ICD-10-CM

## 2022-08-25 DIAGNOSIS — E785 Hyperlipidemia, unspecified: Secondary | ICD-10-CM | POA: Diagnosis not present

## 2022-08-25 DIAGNOSIS — I10 Essential (primary) hypertension: Secondary | ICD-10-CM | POA: Diagnosis not present

## 2022-08-25 DIAGNOSIS — I5023 Acute on chronic systolic (congestive) heart failure: Secondary | ICD-10-CM | POA: Diagnosis not present

## 2022-08-25 DIAGNOSIS — R9389 Abnormal findings on diagnostic imaging of other specified body structures: Secondary | ICD-10-CM | POA: Diagnosis not present

## 2022-08-25 DIAGNOSIS — I4891 Unspecified atrial fibrillation: Secondary | ICD-10-CM | POA: Diagnosis not present

## 2022-08-25 DIAGNOSIS — K2100 Gastro-esophageal reflux disease with esophagitis, without bleeding: Secondary | ICD-10-CM

## 2022-08-25 DIAGNOSIS — I5041 Acute combined systolic (congestive) and diastolic (congestive) heart failure: Secondary | ICD-10-CM

## 2022-08-25 DIAGNOSIS — J449 Chronic obstructive pulmonary disease, unspecified: Secondary | ICD-10-CM

## 2022-08-25 LAB — MAGNESIUM: Magnesium: 1.6 mg/dL — ABNORMAL LOW (ref 1.7–2.4)

## 2022-08-25 LAB — COMPREHENSIVE METABOLIC PANEL
ALT: 15 U/L (ref 0–44)
AST: 15 U/L (ref 15–41)
Albumin: 2.9 g/dL — ABNORMAL LOW (ref 3.5–5.0)
Alkaline Phosphatase: 59 U/L (ref 38–126)
Anion gap: 11 (ref 5–15)
BUN: 13 mg/dL (ref 8–23)
CO2: 30 mmol/L (ref 22–32)
Calcium: 8.4 mg/dL — ABNORMAL LOW (ref 8.9–10.3)
Chloride: 99 mmol/L (ref 98–111)
Creatinine, Ser: 1.08 mg/dL — ABNORMAL HIGH (ref 0.44–1.00)
GFR, Estimated: 55 mL/min — ABNORMAL LOW (ref >60)
Glucose, Bld: 85 mg/dL (ref 70–99)
Potassium: 3.1 mmol/L — ABNORMAL LOW (ref 3.5–5.1)
Sodium: 140 mmol/L (ref 135–145)
Total Bilirubin: 1.5 mg/dL — ABNORMAL HIGH (ref 0.3–1.2)
Total Protein: 5.5 g/dL — ABNORMAL LOW (ref 6.5–8.1)

## 2022-08-25 LAB — T4, FREE: Free T4: 1.52 ng/dL — ABNORMAL HIGH (ref 0.61–1.12)

## 2022-08-25 LAB — CBC WITH DIFFERENTIAL/PLATELET
Abs Immature Granulocytes: 0.04 10*3/uL (ref 0.00–0.07)
Basophils Absolute: 0 10*3/uL (ref 0.0–0.1)
Basophils Relative: 0 %
Eosinophils Absolute: 0.3 10*3/uL (ref 0.0–0.5)
Eosinophils Relative: 4 %
HCT: 37.1 % (ref 36.0–46.0)
Hemoglobin: 11.9 g/dL — ABNORMAL LOW (ref 12.0–15.0)
Immature Granulocytes: 1 %
Lymphocytes Relative: 14 %
Lymphs Abs: 1 10*3/uL (ref 0.7–4.0)
MCH: 29.6 pg (ref 26.0–34.0)
MCHC: 32.1 g/dL (ref 30.0–36.0)
MCV: 92.3 fL (ref 80.0–100.0)
Monocytes Absolute: 0.6 10*3/uL (ref 0.1–1.0)
Monocytes Relative: 8 %
Neutro Abs: 5.3 10*3/uL (ref 1.7–7.7)
Neutrophils Relative %: 73 %
Platelets: 168 10*3/uL (ref 150–400)
RBC: 4.02 MIL/uL (ref 3.87–5.11)
RDW: 15.1 % (ref 11.5–15.5)
WBC: 7.3 10*3/uL (ref 4.0–10.5)
nRBC: 0 % (ref 0.0–0.2)

## 2022-08-25 LAB — PROCALCITONIN: Procalcitonin: <0.1 ng/mL

## 2022-08-25 LAB — TSH: TSH: 4.724 u[IU]/mL — ABNORMAL HIGH (ref 0.350–4.500)

## 2022-08-25 LAB — TROPONIN I (HIGH SENSITIVITY): Troponin I (High Sensitivity): 14 ng/L (ref <18)

## 2022-08-25 MED ORDER — ACETAMINOPHEN 325 MG PO TABS: 650.0000 mg | ORAL_TABLET | Freq: Four times a day (QID) | ORAL | Status: DC | PRN

## 2022-08-25 MED ORDER — FLUTICASONE PROPIONATE 50 MCG/ACT NA SUSP
2.0000 | Freq: Every day | NASAL | Status: DC
  Administered 2022-08-25 – 2022-08-28 (×4): 2 via NASAL
  Filled 2022-08-25: qty 16

## 2022-08-25 MED ORDER — ONDANSETRON HCL 4 MG PO TABS: 4.0000 mg | ORAL_TABLET | Freq: Four times a day (QID) | ORAL | Status: DC | PRN

## 2022-08-25 MED ORDER — PRAMIPEXOLE DIHYDROCHLORIDE 1 MG PO TABS
0.5000 mg | ORAL_TABLET | Freq: Every day | ORAL | Status: DC
  Administered 2022-08-25 – 2022-08-27 (×3): 0.5 mg via ORAL
  Filled 2022-08-25 (×3): qty 1

## 2022-08-25 MED ORDER — ATORVASTATIN CALCIUM 40 MG PO TABS
40.0000 mg | ORAL_TABLET | Freq: Every day | ORAL | Status: DC
  Administered 2022-08-25 – 2022-08-28 (×4): 40 mg via ORAL
  Filled 2022-08-25 (×4): qty 1

## 2022-08-25 MED ORDER — ONDANSETRON HCL 4 MG/2ML IJ SOLN: 4.0000 mg | Freq: Four times a day (QID) | INTRAMUSCULAR | Status: DC | PRN

## 2022-08-25 MED ORDER — METOPROLOL TARTRATE 25 MG PO TABS
12.5000 mg | ORAL_TABLET | Freq: Two times a day (BID) | ORAL | Status: DC
  Administered 2022-08-25 – 2022-08-27 (×4): 12.5 mg via ORAL
  Filled 2022-08-25 (×8): qty 1

## 2022-08-25 MED ORDER — MORPHINE SULFATE (PF) 2 MG/ML IV SOLN
2.0000 mg | INTRAVENOUS | Status: DC | PRN
  Administered 2022-08-25: 2 mg via INTRAVENOUS
  Filled 2022-08-25: qty 1

## 2022-08-25 MED ORDER — MIDODRINE HCL 5 MG PO TABS
5.0000 mg | ORAL_TABLET | Freq: Three times a day (TID) | ORAL | Status: DC
  Administered 2022-08-25: 5 mg via ORAL
  Filled 2022-08-25: qty 1

## 2022-08-25 MED ORDER — SERTRALINE HCL 50 MG PO TABS
200.0000 mg | ORAL_TABLET | Freq: Every day | ORAL | Status: DC
  Administered 2022-08-25 – 2022-08-28 (×4): 200 mg via ORAL
  Filled 2022-08-25 (×4): qty 4

## 2022-08-25 MED ORDER — AMIODARONE HCL IN DEXTROSE 360-4.14 MG/200ML-% IV SOLN
60.0000 mg/h | INTRAVENOUS | Status: DC
  Administered 2022-08-25 (×2): 60 mg/h via INTRAVENOUS
  Filled 2022-08-25: qty 200

## 2022-08-25 MED ORDER — AMIODARONE LOAD VIA INFUSION
150.0000 mg | Freq: Once | INTRAVENOUS | Status: AC
  Administered 2022-08-25: 150 mg via INTRAVENOUS
  Filled 2022-08-25: qty 83.34

## 2022-08-25 MED ORDER — MAGNESIUM SULFATE 2 GM/50ML IV SOLN
2.0000 g | Freq: Once | INTRAVENOUS | Status: AC
  Administered 2022-08-25: 2 g via INTRAVENOUS
  Filled 2022-08-25: qty 50

## 2022-08-25 MED ORDER — OXYCODONE HCL 5 MG PO TABS: 5.0000 mg | ORAL_TABLET | ORAL | Status: DC | PRN

## 2022-08-25 MED ORDER — ORAL CARE MOUTH RINSE: 15.0000 mL | OROMUCOSAL | Status: DC | PRN

## 2022-08-25 MED ORDER — POTASSIUM CHLORIDE CRYS ER 20 MEQ PO TBCR
40.0000 meq | EXTENDED_RELEASE_TABLET | Freq: Four times a day (QID) | ORAL | Status: AC
  Administered 2022-08-25 (×2): 40 meq via ORAL
  Filled 2022-08-25 (×2): qty 2

## 2022-08-25 MED ORDER — CHLORHEXIDINE GLUCONATE CLOTH 2 % EX PADS
6.0000 | MEDICATED_PAD | Freq: Every day | CUTANEOUS | Status: DC
  Administered 2022-08-25 – 2022-08-28 (×5): 6 via TOPICAL

## 2022-08-25 MED ORDER — APIXABAN 5 MG PO TABS
5.0000 mg | ORAL_TABLET | Freq: Two times a day (BID) | ORAL | Status: DC
  Administered 2022-08-25 – 2022-08-28 (×8): 5 mg via ORAL
  Filled 2022-08-25 (×8): qty 1

## 2022-08-25 MED ORDER — FUROSEMIDE 10 MG/ML IJ SOLN
40.0000 mg | Freq: Two times a day (BID) | INTRAMUSCULAR | Status: DC
  Administered 2022-08-25 (×2): 40 mg via INTRAVENOUS
  Filled 2022-08-25 (×2): qty 4

## 2022-08-25 MED ORDER — FAMOTIDINE 20 MG PO TABS
20.0000 mg | ORAL_TABLET | Freq: Every day | ORAL | Status: DC
  Administered 2022-08-25 – 2022-08-28 (×4): 20 mg via ORAL
  Filled 2022-08-25 (×4): qty 1

## 2022-08-25 MED ORDER — AMIODARONE HCL 200 MG PO TABS
200.0000 mg | ORAL_TABLET | Freq: Every day | ORAL | Status: DC
  Administered 2022-08-25: 200 mg via ORAL
  Filled 2022-08-25: qty 1

## 2022-08-25 MED ORDER — GABAPENTIN 300 MG PO CAPS
600.0000 mg | ORAL_CAPSULE | Freq: Three times a day (TID) | ORAL | Status: DC
  Administered 2022-08-25 – 2022-08-28 (×10): 600 mg via ORAL
  Filled 2022-08-25 (×8): qty 2
  Filled 2022-08-25: qty 6
  Filled 2022-08-25: qty 2

## 2022-08-25 MED ORDER — PANTOPRAZOLE SODIUM 40 MG PO TBEC
40.0000 mg | DELAYED_RELEASE_TABLET | Freq: Two times a day (BID) | ORAL | Status: DC
  Administered 2022-08-25 – 2022-08-28 (×8): 40 mg via ORAL
  Filled 2022-08-25 (×8): qty 1

## 2022-08-25 MED ORDER — ACETAMINOPHEN 650 MG RE SUPP: 650.0000 mg | Freq: Four times a day (QID) | RECTAL | Status: DC | PRN

## 2022-08-25 MED ORDER — AMIODARONE HCL IN DEXTROSE 360-4.14 MG/200ML-% IV SOLN
30.0000 mg/h | INTRAVENOUS | Status: DC
  Administered 2022-08-25 – 2022-08-26 (×3): 30 mg/h via INTRAVENOUS
  Filled 2022-08-25 (×4): qty 200

## 2022-08-25 NOTE — Assessment & Plan Note (Signed)
-   Last echo 1 month ago - Ejection fraction was 40 to 45% with mildly decreased LV function, global hypokinesis and indeterminate diastolic parameters - Monitor intake and output - Lasix twice daily - Patient reports that she only takes Lasix as needed at home, the first time she has taken Lasix in a while was that same day of presentation continue - Continue Lipitor, metoprolol - Patient complains of orthopnea, peripheral edema, dyspnea on exertion cardiomegaly - Continue to monitor

## 2022-08-25 NOTE — TOC Initial Note (Signed)
Transition of Care [facility]) - Initial/Assessment Note    Patient Details  Name: Allison Rollins MRN: [id number] Date of Birth: 1950/09/05  Transition of [facility]) CM/SW Contact:    Villa Herb, LCSWA Phone Number: 08/25/2022, 12:33 PM  Clinical Narrative:                 Pt is high risk for readmission. CSW spoke with pt to complete assessment. Pt lives alone. Pt is independent in completing her ADLs. Pt rides the bus when needed. Pt has HH PT with [facility] currently. Will need new orders prior to D/C. Pt has a cane, walker and home O2. TOC to follow.   Expected Discharge Plan: Home w Home Health Services Barriers to Discharge: Continued Medical Work up   Patient Goals and CMS Choice Patient states their goals for this hospitalization and ongoing recovery are:: return home with [facility] CMS Medicare.gov Compare Post Acute Care list provided to:: Patient Choice offered to / list presented to : Patient      Expected Discharge Plan and Services In-house Referral: Clinical Social Work Discharge Planning Services: CM Consult Post Acute Care Choice: Home Health Living arrangements for the past 2 months: Single Family Home                                      Prior Living Arrangements/Services Living arrangements for the past 2 months: Single Family Home Lives with:: Self Patient language and need for interpreter reviewed:: Yes Do you feel safe going back to the place where you live?: Yes      Need for Family Participation in Patient Care: Yes (Comment) Care giver support system in place?: Yes (comment) Current home services: DME Criminal Activity/Legal Involvement Pertinent to Current Situation/Hospitalization: No - Comment as needed  Activities of Daily Living Home Assistive Devices/Equipment: Environmental consultant (specify type), Cane (specify quad or straight), Oxygen ADL Screening (condition at time of admission) Patient's cognitive ability adequate to safely complete daily  activities?: Yes Is the patient deaf or have difficulty hearing?: No Does the patient have difficulty seeing, even when wearing glasses/contacts?: No Does the patient have difficulty concentrating, remembering, or making decisions?: No Patient able to express need for assistance with ADLs?: Yes Does the patient have difficulty dressing or bathing?: Yes Independently performs ADLs?: No Communication: Independent Dressing (OT): Needs assistance Is this a change from baseline?: Pre-admission baseline Grooming: Independent with device (comment) Feeding: Independent Bathing: Needs assistance Is this a change from baseline?: Pre-admission baseline Toileting: Needs assistance Is this a change from baseline?: Pre-admission baseline In/Out Bed: Independent with device (comment) Walks in Home: Independent with device (comment) (uses cane and walker) Does the patient have difficulty walking or climbing stairs?: Yes Weakness of Legs: Both Weakness of Arms/Hands: Both  Permission Sought/Granted                  Emotional Assessment Appearance:: Appears stated age Attitude/Demeanor/Rapport: Engaged Affect (typically observed): Accepting Orientation: : Oriented to Self, Oriented to Place, Oriented to  Time, Oriented to Situation Alcohol / Substance Use: Not Applicable Psych Involvement: No (comment)  Admission diagnosis:  Atrial fibrillation with rapid ventricular response (HCC) [I48.91] Atrial fibrillation with RVR (HCC) [I48.91] Patient Active Problem List   Diagnosis Date Noted   Abnormal chest x-ray 08/25/2022   Atrial fibrillation with RVR (HCC) 08/24/2022   Cardiomyopathy (HCC) 08/16/2022   Hypotension 08/16/2022   Acute combined systolic  and diastolic heart failure (HCC) 08/10/2022   Chronic respiratory failure with hypoxia (HCC) 08/10/2022   Neurocognitive deficits 07/21/2022   Atrial fibrillation (HCC) 07/10/2022   Morbid obesity (HCC) 02/24/2022   Gastroesophageal reflux  disease with esophagitis 02/24/2022   Dyslipidemia 09/05/2021   Parkinson's disease 09/05/2021   Chronic obstructive pulmonary disease (HCC) 09/05/2021   Edema 01/21/2021   Stage 3a chronic kidney disease (HCC) 06/17/2020   Osteopenia 01/22/2020   Neuropathy 10/21/2019   Achalasia of esophagus    Restless leg 11/24/2018   Erosive gastritis    Seasonal allergic rhinitis due to pollen 07/20/2018   Recurrent depression (HCC) 12/26/2015   Generalized anxiety disorder 03/12/2015   Prediabetes 03/12/2015   Back pain 03/12/2015   Stress incontinence 01/06/2015   Acute on chronic diastolic CHF (congestive heart failure) (HCC) 01/03/2015   Obesity (BMI 30-39.9) 07/01/2011   Essential hypertension 07/01/2011   Mixed hyperlipidemia 07/01/2011   GERD (gastroesophageal reflux disease) 09/08/2010   PCP:  Gabriel Earing, FNP Pharmacy:   [facility] [address] [place] - [address] [place] [address] Phone: [phone] Fax: [phone]  [facility] [place] - [address] [place] [address] Phone: [phone] Fax: [phone]  [facility] - [place], [place] - [address] [place] [address] Phone: [phone] Fax: [phone]     Social Determinants of Health (SDOH) Social History: SDOH Screenings   Food Insecurity: No Food Insecurity (08/25/2022)  Housing: Low Risk  (08/25/2022)  Transportation Needs: No Transportation Needs (08/25/2022)  Utilities: Not At Risk (08/25/2022)  Alcohol Screen: Low Risk  (04/16/2022)  Depression (PHQ2-9): Low Risk  (04/16/2022)  Recent Concern: Depression (PHQ2-9) - High Risk (02/24/2022)  Financial Resource Strain: Medium Risk (04/16/2022)  Physical Activity: Insufficiently Active (04/16/2022)  Social Connections: Moderately Integrated (04/16/2022)  Recent Concern: Social Connections - Socially Isolated (03/24/2022)  Stress: No Stress Concern Present (04/16/2022)  Tobacco  Use: Medium Risk (08/25/2022)   SDOH Interventions:     Readmission Risk Interventions    08/25/2022   12:32 PM 08/13/2022    1:59 PM  Readmission Risk Prevention Plan  Transportation Screening Complete Complete  PCP or Specialist Appt within 5-7 Days  Not Complete  Home Care Screening  Complete  Medication Review (RN CM)  Complete  HRI or Home Care Consult Complete   Social Work Consult for Recovery Care Planning/Counseling Complete   Palliative Care Screening Not Applicable   Medication Review Oceanographer) Complete

## 2022-08-25 NOTE — Assessment & Plan Note (Signed)
-   Patient wears 3 L nasal cannula at home and that is what she is requiring today

## 2022-08-25 NOTE — Assessment & Plan Note (Addendum)
-   None - Holding albuterol in the setting of RVR and absence of wheeze

## 2022-08-25 NOTE — Plan of Care (Signed)

## 2022-08-25 NOTE — Consult Note (Addendum)
Cardiology Consultation   Patient ID: DEBORRA PHEGLEY MRN: [id number]; DOB: 31-Oct-1950  Admit date: 08/24/2022 Date of Consult: 08/25/2022  PCP:  Gabriel Earing, FNP   [facility] Providers Cardiologist:  Dina Rich, MD        Patient Profile:   Allison Rollins is a 72 y.o. 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 Carren Rang.  History of Present Illness:   Ms. Keeler was admitted in 06/2022 and was found to have presumed new-onset atrial fibrillation. She did not wish to undergo DCCV, therefore was started on antiarrhythmic therapy with Amiodarone 200mg  BID and then 200mg  daily starting on 07/25/2022 along with being on Toprol-XL 100mg  daily and Eliquis 5mg  BID for anticoagulation.    She was again admitted from 4/16 - 08/17/2022 for a mechanical fall which was felt to be due to orthostatic hypotension. Was also treated for a UTI during admission. Given hypotension, she was started on Midodrine and Cardiology was consulted due to episodes of atrial fibrillation with RVR. She was discharged on Lopressor 12.5mg  BID, Amiodarone 200mg  daily, Eliquis 5mg  BID and Midodrine 10mg  TID. Was informed to take Lasix 20mg  for 3 days then PRN.   She presented back to the ED on 08/24/2022 for evaluation of worsening dyspnea, chest pain and lower extremity edema. Some notes mentioned she was not taking her medications regularly at home. In talking with the patient today, she reports she was generally feeling unwell at home for the past several days. Says she was hurting in her stomach and had associated nausea and diarrhea with a poor appetite. Says she did not feel like taking her medications 1 day earlier this week and skipped all of them. Reports having orthopnea and PND along with  dyspnea on exertion. Says her weight had declined to 201 lbs on her home scales but she feels this was due to her poor appetite. Was having some chest discomfort yesterday when she tried to take a deep breath but this has now resolved. She remains on Eliquis for anticoagulation with no reports of active bleeding. Reports that she is frequently depressed at home as most of her family members have passed away. She does have 2 kittens that she takes care of and is anxious to return home to be with them.   Her HR was initially in the 130's upon arrival. Labs showed WBC 8.0, Hgb 13.1, platelets 198, Na+ 140, K+ 4.0 and creatinine 1.13. BNP elevated to 1029. Initial and repeat Hs Troponin negative at 12 and 14. TSH elevated to 4.724 with follow-up labs pending. CXR with possible early infiltrate. EKG shows atrial fibrillation with RVR, HR 127 with PVC's.   She was started on IV Cardizem. HR has been in the 80's to 90's overnight and this morning on 5mg /hr. Has been continued on Amiodarone 200mg  daily and Lopressor 12.5mg  BID. She received IV Lasix 40mg  while in the ED and has been started on 40mg  BID. She has a recorded net output of -1.3 L thus far and weight at 206 lbs this morning (listed at 219 lbs at the time of hospital discharge in 07/2022).    Past Medical History:  Diagnosis Date   Anxiety    Asthma    Chest pain 12/22/2015   CHF (congestive heart failure) (HCC)  COPD (chronic obstructive pulmonary disease) (HCC)    Depression    Headache    HTN (hypertension)    Hypokalemia 07/28/2013   Kidney stones    Osteopenia 01/22/2020   Parkinson's disease    Pneumonia    Pre-diabetes    S/P colonoscopy 10/15/04   normal   Vitamin D deficiency 11/24/2018    Past Surgical History:  Procedure Laterality Date   CHOLECYSTECTOMY     COLONOSCOPY  10/15/2004   Normal rectum/Normal colon   COLONOSCOPY N/A 05/10/2016   Procedure: COLONOSCOPY;  Surgeon: West Bali, MD;  Location: [facility];   Service: Endoscopy;  Laterality: N/A;  10:30   ESOPHAGEAL MANOMETRY N/A 04/02/2019   Procedure: ESOPHAGEAL MANOMETRY (EM);  Surgeon: Napoleon Form, MD;  Location: [facility];  Service: Endoscopy;  Laterality: N/A;   ESOPHAGOGASTRODUODENOSCOPY  09/11/2010   Dysphagia likely multifactorial (possible Candida esophagitis, likely nonspecific esophageal motility disorder, and/or uncontrolled gastroesophageal reflux disease), status post empiric dilation   ESOPHAGOGASTRODUODENOSCOPY N/A 11/13/2018   Dr. Darrick Penna: No endoscopic esophageal abnormality to explain patient's dysphagia.  Esophagus dilated.  Moderate erosive/nodular gastritis with benign biopsies.   KIDNEY STONE SURGERY     SAVORY DILATION N/A 11/13/2018   Procedure: SAVORY DILATION;  Surgeon: West Bali, MD;  Location: [facility];  Service: Endoscopy;  Laterality: N/A;     Home Medications:  Prior to Admission medications   Medication Sig Start Date End Date Taking? Authorizing Provider  albuterol (PROVENTIL) (2.5 MG/3ML) 0.083% nebulizer solution Take 3 mLs (2.5 mg total) by nebulization every 6 (six) hours as needed for wheezing or shortness of breath. 01/12/18  Yes Rakes, Doralee Albino, FNP  albuterol (VENTOLIN HFA) 108 (90 Base) MCG/ACT inhaler Inhale 2 puffs into the lungs every 6 (six) hours as needed for wheezing or shortness of breath. 08/18/20  Yes Gabriel Earing, FNP  alum & mag hydroxide-simeth (MAALOX/MYLANTA) 200-200-20 MG/5ML suspension Take 30 mLs by mouth every 6 (six) hours as needed for indigestion or heartburn. 07/19/22  Yes Shahmehdi, Gemma Payor, MD  amiodarone (PACERONE) 200 MG tablet Take 1 tablet (200 mg total) by mouth daily. 07/26/22 09/24/22 Yes Shahmehdi, Gemma Payor, MD  apixaban (ELIQUIS) 5 MG TABS tablet Take 1 tablet (5 mg total) by mouth 2 (two) times daily. 07/07/22 08/25/22 Yes Newman Nip, NP  Ascorbic Acid (VITAMIN C PO) Take 1 tablet by mouth 3 (three) times daily.   Yes [provider]   atorvastatin (LIPITOR) 40 MG tablet Take 1 tablet by mouth every day 08/02/22  Yes Gabriel Earing, FNP  CALCIUM PO Take 1 tablet by mouth daily.   Yes [provider]  cholecalciferol (VITAMIN D3) 25 MCG (1000 UT) tablet Take 1,000 Units by mouth daily.   Yes [provider]  famotidine (PEPCID) 20 MG tablet Take 20 mg by mouth daily. 07/29/22  Yes [provider]  furosemide (LASIX) 20 MG tablet Take twice a day for 3 days and then as needed for swelling or shortness of breath. 08/17/22  Yes Shah, Pratik D, DO  gabapentin (NEURONTIN) 300 MG capsule Take 300 mg by mouth See admin instructions. Take 600 mg by mouth three times daily and at bedtime. 07/29/22  Yes [provider]  metoprolol tartrate (LOPRESSOR) 25 MG tablet Take 0.5 tablets (12.5 mg total) by mouth 2 (two) times daily. 08/17/22 09/16/22 Yes Shah, Pratik D, DO  midodrine (PROAMATINE) 10 MG tablet Take 1 tablet (10 mg total) by mouth 3 (three)  times daily with meals. 08/16/22 10/15/22 Yes Shah, Pratik D, DO  nitroGLYCERIN (NITROSTAT) 0.4 MG SL tablet Place 1 tablet (0.4 mg total) under the tongue every 5 (five) minutes as needed for chest pain. 12/25/15  Yes [name], MD  pantoprazole (PROTONIX) 40 MG tablet Take 1 tablet by mouth twice daily 08/02/22  Yes Gabriel Earing, FNP  pramipexole (MIRAPEX) 0.5 MG tablet Take 1 tablet by mouth at bedtime 06/02/22  Yes Gabriel Earing, FNP  sertraline (ZOLOFT) 100 MG tablet Take 2 tablets (200 mg total) by mouth daily. 02/24/22  Yes Gabriel Earing, FNP  fluticasone Aleda Grana) 50 MCG/ACT nasal spray Use 2 sprays into each nostril every day Patient not taking: Reported on 08/25/2022 05/09/22   Gabriel Earing, FNP  glucose blood (ACCU-CHEK AVIVA PLUS) test strip Test sugars daily 02/29/20   Dettinger, Elige Radon, MD    Inpatient Medications: Scheduled Meds:  amiodarone  200 mg Oral Daily   apixaban  5 mg Oral BID   atorvastatin  40 mg Oral Daily    Chlorhexidine Gluconate Cloth  6 each Topical Daily   famotidine  20 mg Oral Daily   fluticasone  2 spray Each Nare Daily   furosemide  40 mg Intravenous BID   gabapentin  600 mg Oral TID   metoprolol tartrate  12.5 mg Oral BID   pantoprazole  40 mg Oral BID   pramipexole  0.5 mg Oral QHS   sertraline  200 mg Oral Daily   Continuous Infusions:  diltiazem (CARDIZEM) infusion Stopped (08/25/22 1025)   PRN Meds: acetaminophen **OR** acetaminophen, morphine injection, ondansetron **OR** ondansetron (ZOFRAN) IV, mouth rinse, oxyCODONE  Allergies:    Allergies  Allergen Reactions   Egg Solids, Whole    Farxiga [Dapagliflozin] Swelling   Penicillins Itching and Rash    Has patient had a PCN reaction causing immediate rash, facial/tongue/throat swelling, SOB or lightheadedness with hypotension: no Has patient had a PCN reaction causing severe rash involving mucus membranes or skin necrosis: no Has patient had a PCN reaction that required hospitalization: no Has patient had a PCN reaction occurring within the last 10 years: no If all of the above answers are "NO", then may proceed with Cephalosporin use.    Sulfa Antibiotics Rash    Social History:   Social History   Socioeconomic History   Marital status: Widowed    Spouse name: Not on file   Number of children: 2   Years of education: 28   Highest education level: 12th grade  Occupational History   Occupation: disabled  Tobacco Use   Smoking status: Former    Packs/day: 0.25    Years: 30.00    Additional pack years: 0.00    Total pack years: 7.50    Types: Cigarettes    Start date: 01/04/1965    Quit date: 01/03/1995    Years since quitting: 27.6    Passive exposure: Past   Smokeless tobacco: Never  Vaping Use   Vaping Use: Never used  Substance and Sexual Activity   Alcohol use: No    Alcohol/week: 0.0 standard drinks of alcohol   Drug use: No   Sexual activity: Not Currently    Partners: Male  Other Topics  Concern   Not on file  Social History Narrative   Lives alone. No stairs. Has ramps. Handicap bathroom   Social Determinants of Health   Financial Resource Strain: Medium Risk (04/16/2022)   Overall Financial Resource Strain (CARDIA)  Difficulty of Paying Living Expenses: Somewhat hard  Food Insecurity: No Food Insecurity (08/25/2022)   Hunger Vital Sign    Worried About Running Out of Food in the Last Year: Never true    Ran Out of Food in the Last Year: Never true  Transportation Needs: No Transportation Needs (08/25/2022)   PRAPARE - Administrator, Civil Service (Medical): No    Lack of Transportation (Non-Medical): No  Physical Activity: Insufficiently Active (04/16/2022)   Exercise Vital Sign    Days of Exercise per Week: 3 days    Minutes of Exercise per Session: 30 min  Stress: No Stress Concern Present (04/16/2022)   [employer] of Occupational Health - Occupational Stress Questionnaire    Feeling of Stress : Not at all  Social Connections: Moderately Integrated (04/16/2022)   Social Connection and Isolation Panel [NHANES]    Frequency of Communication with Friends and Family: More than three times a week    Frequency of Social Gatherings with Friends and Family: More than three times a week    Attends Religious Services: More than 4 times per year    Active Member of [employer] or Organizations: Yes    Attends Banker Meetings: More than 4 times per year    Marital Status: Widowed  Recent Concern: Social Connections - Socially Isolated (03/24/2022)   Social Connection and Isolation Panel [NHANES]    Frequency of Communication with Friends and Family: More than three times a week    Frequency of Social Gatherings with Friends and Family: More than three times a week    Attends Religious Services: Never    Database administrator or Organizations: No    Attends Banker Meetings: Never    Marital Status: Widowed  Intimate Partner  Violence: Not At Risk (08/25/2022)   Humiliation, Afraid, Rape, and Kick questionnaire    Fear of Current or Ex-Partner: No    Emotionally Abused: No    Physically Abused: No    Sexually Abused: No    Family History:    Family History  Problem Relation Age of Onset   Coronary artery disease Father    COPD Mother    Asthma Brother    Coronary artery disease Sister    Diabetes Sister    Coronary artery disease Brother    Coronary artery disease Sister      ROS:  Please see the history of present illness.   All other ROS reviewed and negative.     Physical Exam/Data:   Vitals:   08/25/22 0800 08/25/22 0900 08/25/22 1000 08/25/22 1100  BP: 105/73 109/86 107/74 101/68  Pulse: (!) 104 90 99 94  Resp: 18 17 17 14   Temp:      TempSrc:      SpO2: 93% 92% 94% 97%  Weight:      Height:        Intake/Output Summary (Last 24 hours) at 08/25/2022 1122 Last data filed at 08/25/2022 1045 Gross per 24 hour  Intake 325.33 ml  Output 1650 ml  Net -1324.67 ml      08/25/2022    1:30 AM 08/24/2022    9:28 PM 08/17/2022    5:00 AM  Last 3 Weights  Weight (lbs) 206 lb 2.1 oz 201 lb 220 lb 3.8 oz  Weight (kg) 93.5 kg 91.173 kg 99.9 kg     Body mass index is 33.27 kg/m.  General:  Pleasant female appearing in no acute distress HEENT:  normal Neck: no JVD Vascular: No carotid bruits; Distal pulses 2+ bilaterally Cardiac:  normal S1, S2; Irregularly irregular.  Lungs: decreased breath sounds along bases.  Abd: soft, nontender, no hepatomegaly  Ext: 1+ pitting edema bilaterally.  Musculoskeletal:  No deformities, BUE and BLE strength normal and equal Skin: warm and dry  Neuro:  CNs 2-12 intact, no focal abnormalities noted Psych:  Normal affect   EKG:  The EKG was personally reviewed and demonstrates: Atrial fibrillation with RVR, HR 127 with PVC's.  Telemetry:  Telemetry was personally reviewed and demonstrates: Atrial fibrillation, HR in 80's to 90's overnight and this morning.    Relevant CV Studies:  Echocardiogram: 07/11/2022 IMPRESSIONS     1. Cannot fully assess wall motion; windows are challenging. Left  ventricular ejection fraction, by estimation, is 40 to 45%. The left  ventricle has mildly decreased function. The left ventricle demonstrates  global hypokinesis. The left ventricular  internal cavity size was mildly dilated. There is mild concentric left  ventricular hypertrophy. Left ventricular diastolic parameters are  indeterminate.   2. Right ventricular systolic function is mildly reduced. The right  ventricular size is not well visualized. Tricuspid regurgitation signal is  inadequate for assessing PA pressure.   3. Left atrial size was moderately dilated.   4. A small pericardial effusion is present.   5. The mitral valve is degenerative. No evidence of mitral valve  regurgitation.   6. Aortic valve regurgitation is not visualized.   7. The inferior vena cava is dilated in size with >50% respiratory  variability, suggesting right atrial pressure of 8 mmHg.   Conclusion(s)/Recommendation(s): EF appears to be mildly decreased from  prior study; windows are challenging.   Laboratory Data:  High Sensitivity Troponin:   Recent Labs  Lab 08/10/22 1359 08/10/22 1515 08/24/22 2208 08/24/22 2358  TROPONINIHS 11 9 12 14      Chemistry Recent Labs  Lab 08/24/22 2208 08/25/22 0515  NA 140 140  K 4.0 3.1*  CL 100 99  CO2 27 30  GLUCOSE 99 85  BUN 14 13  CREATININE 1.13* 1.08*  CALCIUM 8.9 8.4*  MG  --  1.6*  GFRNONAA 52* 55*  ANIONGAP 13 11    Recent Labs  Lab 08/24/22 2208 08/25/22 0515  PROT 6.5 5.5*  ALBUMIN 3.3* 2.9*  AST 20 15  ALT 16 15  ALKPHOS 71 59  BILITOT 1.6* 1.5*   Lipids No results for input(s): "CHOL", "TRIG", "HDL", "LABVLDL", "LDLCALC", "CHOLHDL" in the last 168 hours.  Hematology Recent Labs  Lab 08/24/22 2208 08/25/22 0515  WBC 8.0 7.3  RBC 4.38 4.02  HGB 13.1 11.9*  HCT 40.4 37.1  MCV 92.2  92.3  MCH 29.9 29.6  MCHC 32.4 32.1  RDW 15.3 15.1  PLT 198 168   Thyroid  Recent Labs  Lab 08/25/22 0515  TSH 4.724*    BNP Recent Labs  Lab 08/24/22 2208  BNP 1,029.0*    DDimer No results for input(s): "DDIMER" in the last 168 hours.   Radiology/Studies:  DG Chest Port 1 View  Result Date: 08/24/2022 CLINICAL DATA:  Shortness of breath and chest pain EXAM: PORTABLE CHEST 1 VIEW COMPARISON:  08/10/2022 FINDINGS: Cardiac shadow is enlarged but stable. Aortic calcifications are again seen. Chronic blunting of the left costophrenic angle is noted. Increasing left retrocardiac airspace opacity is noted consistent with early infiltrate. No bony abnormality is noted. IMPRESSION: Increasing left retrocardiac infiltrate. Electronically Signed   By: Eulah Pont.D.  On: 08/24/2022 23:00     Assessment and Plan:   1. Atrial Fibrillation with RVR - She has experienced repeat admissions complicated by this and previously refused DCCV which led to Amiodarone being initiated. She did skip a day of medications earlier this week.  - Will review with Dr. Wyline Mood but given her borderline BP, she may benefit from IV loading for Amiodarone (currently on PO 200mg  daily) and pending HR response with activity, may need a TEE/DCCV this admission. She is very hesitant to pursue this so would hope she would chemically convert. If rates with activity improve, could consider DCCV following 3 weeks of uninterrupted anticoagulation.  - IV Cardizem was just discontinued and rates are in the 80's to 90's at rest. Remains on Lopressor 12.5mg  BID. Can restart Midodrine if needing additional BP support (was listed as taking 10mg  TID at home but was not taking regularly).  - Continue Eliquis 5mg  BID for anticoagulation.   2. Acute HFmrEF - Her EF was at 40-45% by echo in 06/2022 and likely tachycardia-mediated in the setting of atrial fibrillation with RVR. BNP elevated to 1029 on admission.  - She is receiving  IV Lasix 40mg  BID with a recorded net output of -1.3 L thus far and weight at 206 lbs this morning (as at 219 lbs at the time of hospital discharge in 07/2022). Continue with IV Lasix today as she is volume overloaded by examination. Will need to establish a new dry weight.  - BP does not allow for ACE-I/ARB/ARNI/MRA and she has not been started on an SGLT2 inhibitor given her frequent UTI's and yeast infections (also listed as having swelling with Marcelline Deist in the past).   3. HTN - BP has actually been soft, at 101/68 on most recent check. IV Cardizem recently discontinued and she is receiving Lopressor. Continue to follow.   4. Mitral Regurgitation - Noted on prior imaging in 2023 but not mentioned in her report from 06/2022. Continue to follow as an outpatient.   5. Diarrhea/Abdominal Pain - GI panel pending. Further work-up per the admitting team.   6. Depression - She reports feeling depressed as most of her immediate family members have passed away. Remains on Zoloft. Would likely benefit from grief counseling as an outpatient.    For questions or updates, please contact [facility] Please consult [email or web address] for contact info under    Signed, Ellsworth Lennox, PA-C  08/25/2022 11:22 AM   Attending note  Patient seen and discussed with PA Iran Ouch, I agree with her documentation. 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   BNP 1029 WBC 8 Hgb 13.1 Plt 198 K 4 Cr 1.13 BUN 14  Trop 12-->14 EKG afib with RVR CXR increasing left retrocardiac infiltrate.  1.Persistent afib - presented with afib with RVR - started on IV dilt gtt on admission  -management complicated by orthostatic hypotension last admission, had been on midodrine. Also prior to afib issues she had chronic sinus bradycardia, careful use of av nodal agents  - oral amio started 07/14/22.  Has never had IV load.  -has prevoiusly refused DCCV - d/c oral amio, start IV amiodarone.   2. Acute on chronic HFmrEF - 06/2022 echo LVEF 40-45%, indet diastolic, mild RV dysfunction  - CXR increasing left retrocardiac infiltrate. BNP 1029  -negative 1.3 L thus far on admission, she received IV lasix 40mg  x 1 yesterday and due for 40mg   bid today. Downtrend in Cr with diuresis consistent with venous congestion and HF.  - continue IV diuresis   Dina Rich MD

## 2022-08-25 NOTE — Progress Notes (Signed)
Patient admitted after midnight, please see H&P.  Here with a fib with RVR and ? Volume overloaded.  Has been having very watery diarrhea and having stomach upset currently.  Will r/o GI issues with stool studies-- c diff (recent hospitalization) and GI pathogen panel  Marlin Canary DO

## 2022-08-25 NOTE — Assessment & Plan Note (Signed)
Continue Protonix °

## 2022-08-25 NOTE — Assessment & Plan Note (Signed)
Continue Zoloft 

## 2022-08-25 NOTE — H&P (Signed)
History and Physical    Patient: Allison Rollins YQM:[id number] DOB: 1950-06-18 DOA: 08/24/2022 DOS: the patient was seen and examined on 08/25/2022 PCP: Gabriel Earing, FNP  Patient coming from: Home  Chief Complaint:  Chief Complaint  Patient presents with   Shortness of Breath   HPI: Allison Rollins is a 72 y.o. female with medical history significant of anxiety, CHF, COPD, depression, hypertension, Parkinson disease, atrial fibrillation, and more presents the ED with a chief complaint of 2 days of chest pain and dyspnea.  Patient describes her chest pain as a smothering feeling.  She reports it has been constant.  She has had associated palpitations and shortness of breath.  She reports that laying flat makes it worse.  She is also noticed progressive peripheral edema.  Patient has a prescription for Lasix but she reports she only takes it as needed.  She did take it on the same day as presentation but not the day before.  Patient noticed good urine output response to the Lasix that she took.  Patient reports she has been taking her amiodarone and metoprolol as well as Eliquis as prescribed.  She has no chest pain now.  She reports she has had similar episodes in the past, with the same smothering chest discomfort.  Review of systems patient reports she did have diarrhea the day before yesterday.  It was several episodes.  She reports she has not had any appetite so she has not been eating and attributed her loose stools to that.  Patient reports that she has lost 25 pounds in 2 weeks, which is concerning because she is also gained fluid.  So she is losing weight she is lost much more than that when you account for the fluid.  Patient denies dysuria.  She has no other complaints at this time including no cough no fever.  Patient does not smoke, does not drink, does not use illicit drugs.  She is vaccinated for COVID.  Patient is full code. Review of Systems: As mentioned in the history of present  illness. All other systems reviewed and are negative. Past Medical History:  Diagnosis Date   Anxiety    Asthma    Chest pain 12/22/2015   CHF (congestive heart failure) (HCC)    COPD (chronic obstructive pulmonary disease) (HCC)    Depression    Headache    HTN (hypertension)    Hypokalemia 07/28/2013   Kidney stones    Osteopenia 01/22/2020   Parkinson's disease    Pneumonia    Pre-diabetes    S/P colonoscopy 10/15/04   normal   Vitamin D deficiency 11/24/2018   Past Surgical History:  Procedure Laterality Date   CHOLECYSTECTOMY     COLONOSCOPY  10/15/2004   Normal rectum/Normal colon   COLONOSCOPY N/A 05/10/2016   Procedure: COLONOSCOPY;  Surgeon: West Bali, MD;  [facility];  Service: Endoscopy;  Laterality: N/A;  10:30   ESOPHAGEAL MANOMETRY N/A 04/02/2019   Procedure: ESOPHAGEAL MANOMETRY (EM);  Surgeon: Napoleon Form, MD;  Location: [facility];  Service: Endoscopy;  Laterality: N/A;   ESOPHAGOGASTRODUODENOSCOPY  09/11/2010   Dysphagia likely multifactorial (possible Candida esophagitis, likely nonspecific esophageal motility disorder, and/or uncontrolled gastroesophageal reflux disease), status post empiric dilation   ESOPHAGOGASTRODUODENOSCOPY N/A 11/13/2018   Dr. Darrick Penna: No endoscopic esophageal abnormality to explain patient's dysphagia.  Esophagus dilated.  Moderate erosive/nodular gastritis with benign biopsies.   KIDNEY STONE SURGERY     SAVORY DILATION N/A 11/13/2018  Procedure: SAVORY DILATION;  Surgeon: West Bali, MD;  Location: [facility];  Service: Endoscopy;  Laterality: N/A;   Social History:  reports that she quit smoking about 27 years ago. Her smoking use included cigarettes. She started smoking about 57 years ago. She has a 7.50 pack-year smoking history. She has been exposed to tobacco smoke. She has never used smokeless tobacco. She reports that she does not drink alcohol and does not use drugs.  Allergies  Allergen  Reactions   Egg Solids, Whole    Farxiga [Dapagliflozin] Swelling   Penicillins Itching and Rash    Has patient had a PCN reaction causing immediate rash, facial/tongue/throat swelling, SOB or lightheadedness with hypotension: no Has patient had a PCN reaction causing severe rash involving mucus membranes or skin necrosis: no Has patient had a PCN reaction that required hospitalization: no Has patient had a PCN reaction occurring within the last 10 years: no If all of the above answers are "NO", then may proceed with Cephalosporin use.    Sulfa Antibiotics Rash    Family History  Problem Relation Age of Onset   Coronary artery disease Father    COPD Mother    Asthma Brother    Coronary artery disease Sister    Diabetes Sister    Coronary artery disease Brother    Coronary artery disease Sister     Prior to Admission medications   Medication Sig Start Date End Date Taking? Authorizing Provider  albuterol (PROVENTIL) (2.5 MG/3ML) 0.083% nebulizer solution Take 3 mLs (2.5 mg total) by nebulization every 6 (six) hours as needed for wheezing or shortness of breath. 01/12/18   Sonny Masters, FNP  albuterol (VENTOLIN HFA) 108 (90 Base) MCG/ACT inhaler Inhale 2 puffs into the lungs every 6 (six) hours as needed for wheezing or shortness of breath. 08/18/20   Gabriel Earing, FNP  ALLERGY RELIEF CETIRIZINE 10 MG tablet Take 1 tablet by mouth every day 01/08/22   Gwenlyn Fudge, FNP  alum & mag hydroxide-simeth (MAALOX/MYLANTA) 200-200-20 MG/5ML suspension Take 30 mLs by mouth every 6 (six) hours as needed for indigestion or heartburn. 07/19/22   Shahmehdi, Gemma Payor, MD  amiodarone (PACERONE) 200 MG tablet Take 1 tablet (200 mg total) by mouth daily. 07/26/22 09/24/22  Shahmehdi, Gemma Payor, MD  apixaban (ELIQUIS) 5 MG TABS tablet Take 1 tablet (5 mg total) by mouth 2 (two) times daily. 07/07/22 08/10/22  Newman Nip, NP  Ascorbic Acid (VITAMIN C PO) Take 1 tablet by mouth 3 (three) times daily.     [provider]  atorvastatin (LIPITOR) 40 MG tablet Take 1 tablet by mouth every day 08/02/22   Gabriel Earing, FNP  CALCIUM PO Take 1 tablet by mouth daily.    [provider]  cholecalciferol (VITAMIN D3) 25 MCG (1000 UT) tablet Take 1,000 Units by mouth daily.    [provider]  famotidine (PEPCID) 20 MG tablet Take 20 mg by mouth daily. 07/29/22   [provider]  fluticasone (FLONASE) 50 MCG/ACT nasal spray Use 2 sprays into each nostril every day Patient taking differently: Place 2 sprays into both nostrils daily. 05/09/22   Gabriel Earing, FNP  furosemide (LASIX) 20 MG tablet Take twice a day for 3 days and then as needed for swelling or shortness of breath. 08/17/22   Sherryll Burger, Pratik D, DO  gabapentin (NEURONTIN) 300 MG capsule Take 300 mg by mouth See admin instructions. Take 600 mg by mouth three  times daily and at bedtime. 07/29/22   [provider]  glucose blood (ACCU-CHEK AVIVA PLUS) test strip Test sugars daily 02/29/20   Dettinger, Elige Radon, MD  metoprolol tartrate (LOPRESSOR) 25 MG tablet Take 0.5 tablets (12.5 mg total) by mouth 2 (two) times daily. 08/17/22 09/16/22  Sherryll Burger, Pratik D, DO  midodrine (PROAMATINE) 10 MG tablet Take 1 tablet (10 mg total) by mouth 3 (three) times daily with meals. 08/16/22 10/15/22  Sherryll Burger, Pratik D, DO  nitroGLYCERIN (NITROSTAT) 0.4 MG SL tablet Place 1 tablet (0.4 mg total) under the tongue every 5 (five) minutes as needed for chest pain. 12/25/15   [name], MD  pantoprazole (PROTONIX) 40 MG tablet Take 1 tablet by mouth twice daily 08/02/22   Gabriel Earing, FNP  pramipexole (MIRAPEX) 0.5 MG tablet Take 1 tablet by mouth at bedtime 06/02/22   Gabriel Earing, FNP  sertraline (ZOLOFT) 100 MG tablet Take 2 tablets (200 mg total) by mouth daily. 02/24/22   Gabriel Earing, FNP    Physical Exam: Vitals:   08/25/22 0100 08/25/22 0130 08/25/22 0148 08/25/22 0200  BP: (!) 115/96 (!) 125/102 (!) 125/102 (!)  128/90  Pulse: (!) 114 82  (!) 105  Resp: 20 20  (!) 23  Temp:  99.1 F (37.3 C)    TempSrc:  Oral    SpO2: 98% 97%  96%  Weight:  93.5 kg    Height:  5\' 6"  (1.676 m)     1.  General: Patient lying supine in bed,  no acute distress   2. Psychiatric: Alert and oriented x 3, mood and behavior normal for situation, pleasant and cooperative with exam   3. Neurologic: Speech and language are normal, face is symmetric, moves all 4 extremities voluntarily, at baseline without acute deficits on limited exam   4. HEENMT:  Head is atraumatic, normocephalic, pupils reactive to light, neck is supple, trachea is midline, mucous membranes are moist   5. Respiratory : Lungs are clear to auscultation bilaterally without wheezing, rhonchi, rales, no cyanosis, no increase in work of breathing or accessory muscle use   6. Cardiovascular : Heart rate tachycardic, rhythm is irregularly irregular, no murmurs, rubs or gallops, no peripheral edema, peripheral edema present   7. Gastrointestinal:  Abdomen is soft, nondistended, nontender to palpation bowel sounds active, no masses or organomegaly palpated   8. Skin:  Skin is warm, dry and intact without rashes, acute lesions, or ulcers on limited exam   9.Musculoskeletal:  No acute deformities or trauma, no asymmetry in tone, peripheral edema present, peripheral pulses palpated, no tenderness to palpation in the extremities  Data Reviewed: In the ED Temp 97.8, heart rate 31-134, respiratory rate 20-27, blood pressure 108/74-137/105 White blood cell count 8.0, hemoglobin 13.1 Chemistries unremarkable Alk phos 71, albumin slightly low at 3.3, BNP 1029, troponin 12, 14 Chest x-ray shows increased left retrocardiac possible infiltrate EKG shows a heart rate of 127, atrial fibrillation, QTc 494 40 mg of IV Lasix given in the ED and diltiazem drip started Assessment and Plan: * Atrial fibrillation with RVR (HCC) - Heart rate in the 130s at  presentation - Patient is on amiodarone, metoprolol, Eliquis at home - Home meds - She was started on Cardizem drip and her heart rate is now controlled in the low 100s - Consult cardiology - Continue to monitor  Essential hypertension - Continue metoprolol  Dyslipidemia - Continue Lipitor  Abnormal chest x-ray - Concern for possible early infiltrate  on chest x-ray - No leukocytosis, no fever - Dyspnea is better explained by CHF - No indication for antibiotics at this time - Will check procalcitonin  Chronic respiratory failure with hypoxia (HCC) - Patient wears 3 L nasal cannula at home and that is what she is requiring today  Acute combined systolic and diastolic heart failure (HCC) - Last echo 1 month ago - Ejection fraction was 40 to 45% with mildly decreased LV function, global hypokinesis and indeterminate diastolic parameters - Monitor intake and output - Lasix twice daily - Patient reports that she only takes Lasix as needed at home, the first time she has taken Lasix in a while was that same day of presentation continue - Continue Lipitor, metoprolol - Patient complains of orthopnea, peripheral edema, dyspnea on exertion cardiomegaly - Continue to monitor  Gastroesophageal reflux disease with esophagitis - Continue Protonix  Chronic obstructive pulmonary disease (HCC) - None - Holding albuterol in the setting of RVR and absence of wheeze  Generalized anxiety disorder - Continue Zoloft      Advance Care Planning:   Code Status: Full Code  Consults: Cardiology  Family Communication: No family at bedside  Severity of Illness: The appropriate patient status for this patient is INPATIENT. Inpatient status is judged to be reasonable and necessary in order to provide the required intensity of service to ensure the patient's safety. The patient's presenting symptoms, physical exam findings, and initial radiographic and laboratory data in the context of their  chronic comorbidities is felt to place them at high risk for further clinical deterioration. Furthermore, it is not anticipated that the patient will be medically stable for discharge from the hospital within 2 midnights of admission.   * I certify that at the point of admission it is my clinical judgment that the patient will require inpatient hospital care spanning beyond 2 midnights from the point of admission due to high intensity of service, high risk for further deterioration and high frequency of surveillance required.*  Author: Lilyan Gilford, DO 08/25/2022 2:37 AM  For on call review [email or web address].

## 2022-08-25 NOTE — Assessment & Plan Note (Signed)
-   Heart rate in the 130s at presentation - Patient is on amiodarone, metoprolol, Eliquis at home - Home meds - She was started on Cardizem drip and her heart rate is now controlled in the low 100s - Consult cardiology - Continue to monitor

## 2022-08-25 NOTE — Assessment & Plan Note (Signed)
-  Continue Lipitor °

## 2022-08-25 NOTE — Assessment & Plan Note (Signed)
-   Concern for possible early infiltrate on chest x-ray - No leukocytosis, no fever - Dyspnea is better explained by CHF - No indication for antibiotics at this time - Will check procalcitonin

## 2022-08-25 NOTE — Progress Notes (Signed)
   08/25/22 1100  Spiritual Encounters  Type of Visit Initial  Care provided to: Patient  Referral source Chaplain assessment  Reason for visit Routine spiritual support  OnCall Visit No   Chaplain visited with patient during visiting on the unit. The patient Allison Rollins was alert and enjoying a game show on the TV. [name] shared that she is the last of her family. "It seems lonely with no one else here. My son is nearby and he stops by and I have two cats to keep me company. I miss them." She mentioned that she had been in the hospital several times prior. She hopes to get herself straightened out medicine wise and go home soon. I wished her an enjoyable day as I departed.   Valerie Roys  [facility] [phone]

## 2022-08-25 NOTE — Assessment & Plan Note (Signed)
Continue metoprolol. 

## 2022-08-26 ENCOUNTER — Encounter (HOSPITAL_COMMUNITY): Payer: Self-pay | Admitting: Family Medicine

## 2022-08-26 ENCOUNTER — Ambulatory Visit: Payer: Medicare HMO

## 2022-08-26 DIAGNOSIS — I5023 Acute on chronic systolic (congestive) heart failure: Secondary | ICD-10-CM | POA: Diagnosis not present

## 2022-08-26 DIAGNOSIS — I4891 Unspecified atrial fibrillation: Secondary | ICD-10-CM | POA: Diagnosis not present

## 2022-08-26 LAB — CBC
HCT: 38.9 % (ref 36.0–46.0)
Hemoglobin: 12.4 g/dL (ref 12.0–15.0)
MCH: 29.8 pg (ref 26.0–34.0)
MCHC: 31.9 g/dL (ref 30.0–36.0)
MCV: 93.5 fL (ref 80.0–100.0)
Platelets: 176 10*3/uL (ref 150–400)
RBC: 4.16 MIL/uL (ref 3.87–5.11)
RDW: 15.5 % (ref 11.5–15.5)
WBC: 7.6 10*3/uL (ref 4.0–10.5)
nRBC: 0 % (ref 0.0–0.2)

## 2022-08-26 LAB — BASIC METABOLIC PANEL
Anion gap: 9 (ref 5–15)
BUN: 17 mg/dL (ref 8–23)
CO2: 32 mmol/L (ref 22–32)
Calcium: 8.4 mg/dL — ABNORMAL LOW (ref 8.9–10.3)
Chloride: 98 mmol/L (ref 98–111)
Creatinine, Ser: 1.24 mg/dL — ABNORMAL HIGH (ref 0.44–1.00)
GFR, Estimated: 46 mL/min — ABNORMAL LOW (ref >60)
Glucose, Bld: 132 mg/dL — ABNORMAL HIGH (ref 70–99)
Potassium: 3.7 mmol/L (ref 3.5–5.1)
Sodium: 139 mmol/L (ref 135–145)

## 2022-08-26 LAB — MAGNESIUM: Magnesium: 2 mg/dL (ref 1.7–2.4)

## 2022-08-26 MED ORDER — MIDODRINE HCL 5 MG PO TABS
10.0000 mg | ORAL_TABLET | Freq: Three times a day (TID) | ORAL | Status: DC
  Administered 2022-08-26 – 2022-08-28 (×8): 10 mg via ORAL
  Filled 2022-08-26 (×8): qty 2

## 2022-08-26 MED ORDER — POTASSIUM CHLORIDE CRYS ER 20 MEQ PO TBCR
40.0000 meq | EXTENDED_RELEASE_TABLET | Freq: Once | ORAL | Status: AC
  Administered 2022-08-26: 40 meq via ORAL
  Filled 2022-08-26: qty 2

## 2022-08-26 MED ORDER — MIDODRINE HCL 5 MG PO TABS: 10.0000 mg | ORAL_TABLET | Freq: Three times a day (TID) | ORAL | Status: DC

## 2022-08-26 MED ORDER — AMIODARONE LOAD VIA INFUSION
150.0000 mg | Freq: Once | INTRAVENOUS | Status: AC
  Administered 2022-08-26: 150 mg via INTRAVENOUS
  Filled 2022-08-26: qty 83.34

## 2022-08-26 NOTE — NC FL2 (Signed)
[place] MEDICAID FL2 LEVEL OF CARE FORM     IDENTIFICATION  Patient Name: Allison Rollins Birthdate: 03/02/1951 Sex: female Admission Date (Current Location): 08/24/2022  [facility] and [place] Number:  [employer] and Address:  [facility],  [address] [place] [address]      Provider Number: [id number]  Attending Physician Name and Address:  Joseph Art, DO  Relative Name and Phone Number:  Plantz,Bradley (Son) [phone]    Current Level of Care: Hospital Recommended Level of Care: Skilled Nursing Facility Prior Approval Number:    Date Approved/Denied:   PASRR Number: [id number]  Discharge Plan: SNF    Current Diagnoses: Patient Active Problem List   Diagnosis Date Noted   Abnormal chest x-ray 08/25/2022   Atrial fibrillation with RVR (HCC) 08/24/2022   Cardiomyopathy (HCC) 08/16/2022   Hypotension 08/16/2022   Acute combined systolic and diastolic heart failure (HCC) 08/10/2022   Chronic respiratory failure with hypoxia (HCC) 08/10/2022   Neurocognitive deficits 07/21/2022   Atrial fibrillation (HCC) 07/10/2022   Morbid obesity (HCC) 02/24/2022   Gastroesophageal reflux disease with esophagitis 02/24/2022   Dyslipidemia 09/05/2021   Parkinson's disease 09/05/2021   Chronic obstructive pulmonary disease (HCC) 09/05/2021   Edema 01/21/2021   Stage 3a chronic kidney disease (HCC) 06/17/2020   Osteopenia 01/22/2020   Neuropathy 10/21/2019   Achalasia of esophagus    Restless leg 11/24/2018   Erosive gastritis    Seasonal allergic rhinitis due to pollen 07/20/2018   Recurrent depression (HCC) 12/26/2015   Generalized anxiety disorder 03/12/2015   Prediabetes 03/12/2015   Back pain 03/12/2015   Stress incontinence 01/06/2015   Acute on chronic diastolic CHF (congestive heart failure) (HCC) 01/03/2015   Obesity (BMI 30-39.9) 07/01/2011   Essential hypertension 07/01/2011   Mixed hyperlipidemia 07/01/2011   GERD  (gastroesophageal reflux disease) 09/08/2010    Orientation RESPIRATION BLADDER Height & Weight     Self, Time, Situation, Place  Normal (See DC summary) Continent, External catheter Weight: 93.5 kg Height:  5\' 6"  (167.6 cm)  BEHAVIORAL SYMPTOMS/MOOD NEUROLOGICAL BOWEL NUTRITION STATUS      Continent Diet (See DC summary)  AMBULATORY STATUS COMMUNICATION OF NEEDS Skin   Extensive Assist Verbally Normal                       Personal Care Assistance Level of Assistance  Bathing, Feeding, Dressing Bathing Assistance: Limited assistance Feeding assistance: Limited assistance Dressing Assistance: Maximum assistance     Functional Limitations Info  Sight, Hearing, Speech Sight Info: Impaired Hearing Info: Adequate Speech Info: Adequate    SPECIAL CARE FACTORS FREQUENCY  PT (By licensed PT)     PT Frequency: 5 times a week              Contractures Contractures Info: Not present    Additional Factors Info  Code Status, Allergies Code Status Info: Full Allergies Info: egg, farxiga, penicillin, sulfa           Current Medications (08/26/2022):  This is the current hospital active medication list Current Facility-Administered Medications  Medication Dose Route Frequency Provider Last Rate Last Admin   acetaminophen (TYLENOL) tablet 650 mg  650 mg Oral Q6H PRN Zierle-Ghosh, Asia B, DO       Or   acetaminophen (TYLENOL) suppository 650 mg  650 mg Rectal Q6H PRN Zierle-Ghosh, Asia B, DO       amiodarone (NEXTERONE PREMIX) 360-4.14 MG/200ML-% (1.8 mg/mL) IV infusion  30  mg/hr Intravenous Continuous Antoine Poche, MD 16.67 mL/hr at 08/26/22 0953 30 mg/hr at 08/26/22 0953   apixaban (ELIQUIS) tablet 5 mg  5 mg Oral BID Zierle-Ghosh, Asia B, DO   5 mg at 08/26/22 0923   atorvastatin (LIPITOR) tablet 40 mg  40 mg Oral Daily Zierle-Ghosh, Asia B, DO   40 mg at 08/26/22 2536   Chlorhexidine Gluconate Cloth 2 % PADS 6 each  6 each Topical Daily Zierle-Ghosh, Asia B, DO    6 each at 08/26/22 0923   famotidine (PEPCID) tablet 20 mg  20 mg Oral Daily Zierle-Ghosh, Asia B, DO   20 mg at 08/26/22 0923   fluticasone (FLONASE) 50 MCG/ACT nasal spray 2 spray  2 spray Each Nare Daily Zierle-Ghosh, Asia B, DO   2 spray at 08/26/22 0929   gabapentin (NEURONTIN) capsule 600 mg  600 mg Oral TID Zierle-Ghosh, Asia B, DO   600 mg at 08/26/22 0923   metoprolol tartrate (LOPRESSOR) tablet 12.5 mg  12.5 mg Oral BID Randall An M, PA-C   12.5 mg at 08/25/22 0824   midodrine (PROAMATINE) tablet 10 mg  10 mg Oral TID WC Vann, Jessica U, DO   10 mg at 08/26/22 1150   morphine (PF) 2 MG/ML injection 2 mg  2 mg Intravenous Q2H PRN Zierle-Ghosh, Asia B, DO   2 mg at 08/25/22 0306   ondansetron (ZOFRAN) tablet 4 mg  4 mg Oral Q6H PRN Zierle-Ghosh, Asia B, DO       Or   ondansetron (ZOFRAN) injection 4 mg  4 mg Intravenous Q6H PRN Zierle-Ghosh, Asia B, DO       Oral care mouth rinse  15 mL Mouth Rinse PRN Vann, Jessica U, DO       oxyCODONE (Oxy IR/ROXICODONE) immediate release tablet 5 mg  5 mg Oral Q4H PRN Zierle-Ghosh, Asia B, DO       pantoprazole (PROTONIX) EC tablet 40 mg  40 mg Oral BID Zierle-Ghosh, Asia B, DO   40 mg at 08/26/22 6440   pramipexole (MIRAPEX) tablet 0.5 mg  0.5 mg Oral QHS Zierle-Ghosh, Asia B, DO   0.5 mg at 08/25/22 2115   sertraline (ZOLOFT) tablet 200 mg  200 mg Oral Daily Zierle-Ghosh, Asia B, DO   200 mg at 08/26/22 3474     Discharge Medications: Please see discharge summary for a list of discharge medications.  Relevant Imaging Results:  Relevant Lab Results:   Additional Information SS# [id number]  Leitha Bleak, RN

## 2022-08-26 NOTE — TOC Progression Note (Addendum)
Transition of Care [facility]) - Progression Note    Patient Details  Name: Allison Rollins MRN: [id number] Date of Birth: Dec 01, 1950  [facility] of [facility] CM/SW Contact  Karn Cassis, [place] Phone Number: 08/26/2022, 2:20 PM  Clinical Narrative:  PT recommending SNF. Discussed with pt who is agreeable, requesting [facility] or [facility]. Will initiate bed search and will start authorization when appropriate. D/C several days per MD.    Update: [facility] unable to offer. Pt notified. She was tearful about SNF and said she may just go home. LCSW shared that may not be safe as pt lives alone and based on PT evaluation, needed assistance getting up. LCSW encouraged pt to work with PT and get up with RN or mobility specialist to see how she progresses over next few days. She agreed to send referral to [facility] as well in case absolutely necessary. MD and RN updated.    Expected Discharge Plan: Home w Home Health Services Barriers to Discharge: Continued Medical Work up  Expected Discharge Plan and Services In-house Referral: Clinical Social Work Discharge Planning Services: CM Consult Post Acute Care Choice: Home Health Living arrangements for the past 2 months: Single Family Home                                       Social Determinants of Health (SDOH) Interventions SDOH Screenings   Food Insecurity: No Food Insecurity (08/25/2022)  Housing: Low Risk  (08/25/2022)  Transportation Needs: No Transportation Needs (08/25/2022)  Utilities: Not At Risk (08/25/2022)  Alcohol Screen: Low Risk  (04/16/2022)  Depression (PHQ2-9): Low Risk  (04/16/2022)  Recent Concern: Depression (PHQ2-9) - High Risk (02/24/2022)  Financial Resource Strain: Medium Risk (04/16/2022)  Physical Activity: Insufficiently Active (04/16/2022)  Social Connections: Moderately Integrated (04/16/2022)  Recent Concern: Social Connections - Socially Isolated (03/24/2022)  Stress: No Stress Concern Present (04/16/2022)  Tobacco  Use: Medium Risk (08/26/2022)    Readmission Risk Interventions    08/25/2022   12:32 PM 08/13/2022    1:59 PM  Readmission Risk Prevention Plan  Transportation Screening Complete Complete  PCP or Specialist Appt within 5-7 Days  Not Complete  Home Care Screening  Complete  Medication Review (RN CM)  Complete  HRI or Home Care Consult Complete   Social Work Consult for Recovery Care Planning/Counseling Complete   Palliative Care Screening Not Applicable   Medication Review Oceanographer) Complete

## 2022-08-26 NOTE — Progress Notes (Signed)
PROGRESS NOTE    Allison Rollins  ZOX:[id number] DOB: [birth year] DOA: 08/24/2022 PCP: Gabriel Earing, FNP    Brief Narrative:   Allison Rollins is a 72 y.o. female with medical history significant of anxiety, CHF, COPD, depression, hypertension, Parkinson disease, atrial fibrillation, and more presents the ED with a chief complaint of 2 days of chest pain and dyspnea.  Patient describes her chest pain as a smothering feeling.  She reports it has been constant.  She has had associated palpitations and shortness of breath.  She reports that laying flat makes it worse.  She is also noticed progressive peripheral edema.  Patient has a prescription for Lasix but she reports she only takes it as needed.  She did take it on the same day as presentation but not the day before.  Patient noticed good urine output response to the Lasix that she took.  Patient reports she has been taking her amiodarone and metoprolol as well as Eliquis as prescribed.  She has no chest pain now.  She reports she has had similar episodes in the past, with the same smothering chest discomfort.   Assessment and Plan: * Atrial fibrillation with RVR (HCC) - Heart rate in the 130s at presentation - given IV amiodarone -cardiology consulted: Hopefully will chemically convert, if not control rates and ideally compliant with eliquis for 3 weeks where could do just DCCV alone.   Essential hypertension - Continue metoprolol- with holding parameters  Dyslipidemia - Continue Lipitor  Abnormal chest x-ray - Concern for possible early infiltrate on chest x-ray - No leukocytosis, no fever - Dyspnea is better explained by CHF - No indication for antibiotics at this time with negative procalcitonin  Chronic respiratory failure with hypoxia (HCC) - Patient wears 3 L nasal cannula at home  Acute combined systolic and diastolic heart failure (HCC) - Last echo 1 month ago - Ejection fraction was 40 to 45% with mildly decreased LV  function, global hypokinesis and indeterminate diastolic parameters - Monitor intake and output - Lasix held 5/2- negative 1.5L - Patient reports that she only takes Lasix as needed at home, the first time she has taken Lasix in a while was that same day of presentation continue  Gastroesophageal reflux disease with esophagitis - Continue Protonix  Chronic obstructive pulmonary disease (HCC) - Holding albuterol in the setting of RVR and absence of wheeze  Generalized anxiety disorder - Continue Zoloft  H/o orthostatic hypotension -continue midodrine   DVT prophylaxis: SCDs Start: 08/25/22 0123 apixaban (ELIQUIS) tablet 5 mg    Code Status: Full Code   Disposition Plan:  Level of care: Stepdown Status is: Inpatient Remains inpatient appropriate because: needs control of a fib    Consultants:  cards   Subjective: No further diarrhea  Objective: Vitals:   08/26/22 0800 08/26/22 0900 08/26/22 1000 08/26/22 1100  BP:  94/68 95/69 106/81  Pulse: 94 (!) 106 84 95  Resp: 16 18  20   Temp:    97.6 F (36.4 C)  TempSrc:    Oral  SpO2: 99% 95%  96%  Weight:      Height:        Intake/Output Summary (Last 24 hours) at 08/26/2022 1129 Last data filed at 08/26/2022 0700 Gross per 24 hour  Intake 978.97 ml  Output 1600 ml  Net -621.03 ml   Filed Weights   08/24/22 2128 08/25/22 0130  Weight: 91.2 kg 93.5 kg    Examination:   General: Appearance:  Obese female in no acute distress     Lungs:     Clear to auscultation bilaterally, respirations unlabored  Heart:    Normal heart rate. irregular   MS:   All extremities are intact.    Neurologic:   Awake, alert       Data Reviewed: I have personally reviewed following labs and imaging studies  CBC: Recent Labs  Lab 08/24/22 2208 08/25/22 0515 08/26/22 0522  WBC 8.0 7.3 7.6  NEUTROABS 6.2 5.3  --   HGB 13.1 11.9* 12.4  HCT 40.4 37.1 38.9  MCV 92.2 92.3 93.5  PLT 198 168 176   Basic Metabolic  Panel: Recent Labs  Lab 08/24/22 2208 08/25/22 0515 08/26/22 0522  NA 140 140 139  K 4.0 3.1* 3.7  CL 100 99 98  CO2 27 30 32  GLUCOSE 99 85 132*  BUN 14 13 17   CREATININE 1.13* 1.08* 1.24*  CALCIUM 8.9 8.4* 8.4*  MG  --  1.6*  --    GFR: Estimated Creatinine Clearance: 47.3 mL/min (A) (by C-G formula based on SCr of 1.24 mg/dL (H)). Liver Function Tests: Recent Labs  Lab 08/24/22 2208 08/25/22 0515  AST 20 15  ALT 16 15  ALKPHOS 71 59  BILITOT 1.6* 1.5*  PROT 6.5 5.5*  ALBUMIN 3.3* 2.9*   No results for input(s): "LIPASE", "AMYLASE" in the last 168 hours. No results for input(s): "AMMONIA" in the last 168 hours. Coagulation Profile: Recent Labs  Lab 08/24/22 2208  INR 1.3*   Cardiac Enzymes: No results for input(s): "CKTOTAL", "CKMB", "CKMBINDEX", "TROPONINI" in the last 168 hours. BNP (last 3 results) No results for input(s): "PROBNP" in the last 8760 hours. HbA1C: No results for input(s): "HGBA1C" in the last 72 hours. CBG: No results for input(s): "GLUCAP" in the last 168 hours. Lipid Profile: No results for input(s): "CHOL", "HDL", "LDLCALC", "TRIG", "CHOLHDL", "LDLDIRECT" in the last 72 hours. Thyroid Function Tests: Recent Labs    08/25/22 0515  TSH 4.724*  FREET4 1.52*   Anemia Panel: No results for input(s): "VITAMINB12", "FOLATE", "FERRITIN", "TIBC", "IRON", "RETICCTPCT" in the last 72 hours. Sepsis Labs: Recent Labs  Lab 08/25/22 0515  PROCALCITON <0.10    No results found for this or any previous visit (from the past 240 hour(s)).       Radiology Studies: DG Chest Port 1 View  Result Date: 08/24/2022 CLINICAL DATA:  Shortness of breath and chest pain EXAM: PORTABLE CHEST 1 VIEW COMPARISON:  08/10/2022 FINDINGS: Cardiac shadow is enlarged but stable. Aortic calcifications are again seen. Chronic blunting of the left costophrenic angle is noted. Increasing left retrocardiac airspace opacity is noted consistent with early infiltrate.  No bony abnormality is noted. IMPRESSION: Increasing left retrocardiac infiltrate. Electronically Signed   By: Alcide Clever M.D.   On: 08/24/2022 23:00        Scheduled Meds:  apixaban  5 mg Oral BID   atorvastatin  40 mg Oral Daily   Chlorhexidine Gluconate Cloth  6 each Topical Daily   famotidine  20 mg Oral Daily   fluticasone  2 spray Each Nare Daily   gabapentin  600 mg Oral TID   metoprolol tartrate  12.5 mg Oral BID   midodrine  10 mg Oral TID WC   pantoprazole  40 mg Oral BID   pramipexole  0.5 mg Oral QHS   sertraline  200 mg Oral Daily   Continuous Infusions:  amiodarone 30 mg/hr (08/26/22 0953)  LOS: 2 days    Time spent: 45 minutes spent on chart review, discussion with nursing staff, consultants, updating family and interview/physical exam; more than 50% of that time was spent in counseling and/or coordination of care.    Joseph Art, DO [facility] Available via Epic secure chat 7am-7pm After these hours, please refer to coverage provider listed on [email or web address] 08/26/2022, 11:29 AM

## 2022-08-26 NOTE — Progress Notes (Signed)
   08/26/22 1010  Spiritual Encounters  Type of Visit Follow up  Care provided to: Patient  Referral source Chaplain assessment  Reason for visit Routine spiritual support  OnCall Visit No   Chaplin visited with the patient Allison Rollins, whom I met with yesterday. She is in good spirits but a bit sad sitting and thinking in her room. Lynel shared a bit more of her story and hopes to go home soon.  We prayed before I departed for peace, strength of mind and wisdom.   Valerie Roys [facility]  [phone]

## 2022-08-26 NOTE — Progress Notes (Signed)
Rounding Note    Patient Name: Allison Rollins Date of Encounter: 08/26/2022  [facility] Cardiologist: Dina Rich, MD   Subjective   SOB improving but not resolved.   Inpatient Medications    Scheduled Meds:  apixaban  5 mg Oral BID   atorvastatin  40 mg Oral Daily   Chlorhexidine Gluconate Cloth  6 each Topical Daily   famotidine  20 mg Oral Daily   fluticasone  2 spray Each Nare Daily   furosemide  40 mg Intravenous BID   gabapentin  600 mg Oral TID   metoprolol tartrate  12.5 mg Oral BID   midodrine  5 mg Oral TID WC   pantoprazole  40 mg Oral BID   pramipexole  0.5 mg Oral QHS   sertraline  200 mg Oral Daily   Continuous Infusions:  amiodarone 30 mg/hr (08/26/22 0700)   PRN Meds: acetaminophen **OR** acetaminophen, morphine injection, ondansetron **OR** ondansetron (ZOFRAN) IV, mouth rinse, oxyCODONE   Vital Signs    Vitals:   08/26/22 0500 08/26/22 0600 08/26/22 0700 08/26/22 0746  BP: 93/65 (!) 95/54 98/60   Pulse: 96 81 91   Resp: 14 14 15    Temp:    98.1 F (36.7 C)  TempSrc:    Oral  SpO2: 92% 92% 92%   Weight:      Height:        Intake/Output Summary (Last 24 hours) at 08/26/2022 0804 Last data filed at 08/26/2022 0700 Gross per 24 hour  Intake 1122.46 ml  Output 2700 ml  Net -1577.54 ml      08/25/2022    1:30 AM 08/24/2022    9:28 PM 08/17/2022    5:00 AM  Last 3 Weights  Weight (lbs) 206 lb 2.1 oz 201 lb 220 lb 3.8 oz  Weight (kg) 93.5 kg 91.173 kg 99.9 kg      Telemetry    Afib rate controlled - Personally Reviewed  ECG    N/a - Personally Reviewed  Physical Exam   GEN: No acute distress.   Neck: No JVD Cardiac: irreg Respiratory: Clear to auscultation bilaterally. GI: Soft, nontender, non-distended  MS: No edema; No deformity. Neuro:  Nonfocal  Psych: Normal affect   Labs    High Sensitivity Troponin:   Recent Labs  Lab 08/10/22 1359 08/10/22 1515 08/24/22 2208 08/24/22 2358  TROPONINIHS 11 9 12 14       Chemistry Recent Labs  Lab 08/24/22 2208 08/25/22 0515 08/26/22 0522  NA 140 140 139  K 4.0 3.1* 3.7  CL 100 99 98  CO2 27 30 32  GLUCOSE 99 85 132*  BUN 14 13 17   CREATININE 1.13* 1.08* 1.24*  CALCIUM 8.9 8.4* 8.4*  MG  --  1.6*  --   PROT 6.5 5.5*  --   ALBUMIN 3.3* 2.9*  --   AST 20 15  --   ALT 16 15  --   ALKPHOS 71 59  --   BILITOT 1.6* 1.5*  --   GFRNONAA 52* 55* 46*  ANIONGAP 13 11 9     Lipids No results for input(s): "CHOL", "TRIG", "HDL", "LABVLDL", "LDLCALC", "CHOLHDL" in the last 168 hours.  Hematology Recent Labs  Lab 08/24/22 2208 08/25/22 0515 08/26/22 0522  WBC 8.0 7.3 7.6  RBC 4.38 4.02 4.16  HGB 13.1 11.9* 12.4  HCT 40.4 37.1 38.9  MCV 92.2 92.3 93.5  MCH 29.9 29.6 29.8  MCHC 32.4 32.1 31.9  RDW 15.3 15.1 15.5  PLT 198 168 176   Thyroid  Recent Labs  Lab 08/25/22 0515  TSH 4.724*  FREET4 1.52*    BNP Recent Labs  Lab 08/24/22 2208  BNP 1,029.0*    DDimer No results for input(s): "DDIMER" in the last 168 hours.   Radiology    DG Chest Port 1 View  Result Date: 08/24/2022 CLINICAL DATA:  Shortness of breath and chest pain EXAM: PORTABLE CHEST 1 VIEW COMPARISON:  08/10/2022 FINDINGS: Cardiac shadow is enlarged but stable. Aortic calcifications are again seen. Chronic blunting of the left costophrenic angle is noted. Increasing left retrocardiac airspace opacity is noted consistent with early infiltrate. No bony abnormality is noted. IMPRESSION: Increasing left retrocardiac infiltrate. Electronically Signed   By: Alcide Clever M.D.   On: 08/24/2022 23:00    Cardiac Studies     Patient Profile     Allison Rollins is a 72 y.o. 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 Carren Rang.   Assessment & Plan    1.Persistent  afib - presented with afib with RVR - started on IV dilt gtt on admission  -management complicated by orthostatic hypotension last admission, had been on midodrine. Also prior to afib issues she had chronic sinus bradycardia, careful use of av nodal agents, currently on lopressor 12.5mg  bid - she is on eliquis for stroke prevention   - oral amio started 07/14/22. Has never had IV load. She previously turned down DCCV - Started on IV amiodarone 08/25/22, continue IV loading.  - missed doses of eliquis, would require TEE/DCCV if she were to agree. BP's and respiratory status chronically fragile, would be higher risk. Hopefully will chemically convert, if not control rates and ideally compliant with eliquis for 3 weeks where could do just DCCV alone.    2. Acute on chronic HFmrEF - 06/2022 echo LVEF 40-45%, indet diastolic, mild RV dysfunction  - CXR increasing left retrocardiac infiltrate. BNP 1029   -negative 1.5 L yesterday and 1.9 L thus far on admission, she is on  IV lasix 40mg  bid. Mild uptrend in Cr today. - medical therapy limited by low bp's. Frequent UTIs and yeast infections not on SGLT2i -appears near euvolemic, with rise in Cr hold lasix this AM.     3.Orthostatic hypotension - continue midodrine. Increase back to home dose of 10mg  tid.   For questions or updates, please contact [facility] Please consult [email or web address] for contact info under        Signed, Dina Rich, MD  08/26/2022, 8:04 AM

## 2022-08-26 NOTE — Evaluation (Signed)
Physical Therapy Evaluation Patient Details Name: Allison Rollins MRN: [id number] DOB: [birth year] Today's Date: 08/26/2022  History of Present Illness  Allison Rollins is a 72 y.o. female with medical history significant of anxiety, CHF, COPD, depression, hypertension, Parkinson disease, atrial fibrillation, and more presents the ED with a chief complaint of 2 days of chest pain and dyspnea.  Patient describes her chest pain as a smothering feeling.  She reports it has been constant.  She has had associated palpitations and shortness of breath.  She reports that laying flat makes it worse.  She is also noticed progressive peripheral edema.  Patient has a prescription for Lasix but she reports she only takes it as needed.  She did take it on the same day as presentation but not the day before.  Patient noticed good urine output response to the Lasix that she took.  Patient reports she has been taking her amiodarone and metoprolol as well as Eliquis as prescribed.  She has no chest pain now.  She reports she has had similar episodes in the past, with the same smothering chest discomfort.  Review of systems patient reports she did have diarrhea the day before yesterday.  It was several episodes.  She reports she has not had any appetite so she has not been eating and attributed her loose stools to that.  Patient reports that she has lost 25 pounds in 2 weeks, which is concerning because she is also gained fluid.  So she is losing weight she is lost much more than that when you account for the fluid.  Patient denies dysuria.  She has no other complaints at this time including no cough no fever.   Clinical Impression  Patient demonstrates slow labored movement for sitting up at bedside with most difficulty scooting to EOB, very unsteady on feet with difficulty extending trunk due to weakness and limited to a few steps at bedside before having to sit due to generalized weakness and fatigue.  Patient tolerated sitting  up in chair after therapy - RN notified.  Patient will benefit from continued skilled physical therapy in hospital and recommended venue below to increase strength, balance, endurance for safe ADLs and gait.          Recommendations for follow up therapy are one component of a multi-disciplinary discharge planning process, led by the attending physician.  Recommendations may be updated based on patient status, additional functional criteria and insurance authorization.  Follow Up Recommendations Can patient physically be transported by private vehicle: Yes     Assistance Recommended at Discharge Set up Supervision/Assistance  Patient can return home with the following  A lot of help with walking and/or transfers;A little help with bathing/dressing/bathroom;Help with stairs or ramp for entrance;Assistance with cooking/housework    Equipment Recommendations None recommended by PT  Recommendations for Other Services       Functional Status Assessment Patient has had a recent decline in their functional status and demonstrates the ability to make significant improvements in function in a reasonable and predictable amount of time.     Precautions / Restrictions Precautions Precautions: Fall Restrictions Weight Bearing Restrictions: No      Mobility  Bed Mobility Overal bed mobility: Needs Assistance Bed Mobility: Supine to Sit     Supine to sit: Min assist     General bed mobility comments: increased time, labored movement    Transfers Overall transfer level: Needs assistance Equipment used: Rolling walker (2 wheels) Transfers: Sit to/from Stand, Bed  to chair/wheelchair/BSC Sit to Stand: Min assist   Step pivot transfers: Min assist, Mod assist       General transfer comment: unsteady labored movement with flexed trunk    Ambulation/Gait Ambulation/Gait assistance: Mod assist Gait Distance (Feet): 10 Feet Assistive device: Rolling walker (2 wheels) Gait  Pattern/deviations: Decreased step length - left, Decreased stance time - right, Decreased stride length, Trunk flexed Gait velocity: decreased     General Gait Details: limited to a few slow labored unsteady steps at bedside before having to sit due to fatigue  Stairs            Wheelchair Mobility    Modified Rankin (Stroke Patients Only)       Balance Overall balance assessment: Needs assistance Sitting-balance support: Feet supported, No upper extremity supported Sitting balance-Leahy Scale: Fair Sitting balance - Comments: fair/good seated at EOB   Standing balance support: During functional activity, Bilateral upper extremity supported, Reliant on assistive device for balance Standing balance-Leahy Scale: Poor Standing balance comment: fair/poor using RW                             Pertinent Vitals/Pain Pain Assessment Pain Assessment: No/denies pain    Home Living Family/patient expects to be discharged to:: Private residence Living Arrangements: Alone Available Help at Discharge: Family Type of Home: House Home Access: Ramped entrance       Home Layout: One level Home Equipment: Agricultural consultant (2 wheels);Cane - single point      Prior Function Prior Level of Function : Independent/Modified Independent             Mobility Comments: household ambulator using RW ADLs Comments: Assisted by family, son completes grocery shopping and brings some meals to her.     Hand Dominance        Extremity/Trunk Assessment   Upper Extremity Assessment Upper Extremity Assessment: Generalized weakness    Lower Extremity Assessment Lower Extremity Assessment: Generalized weakness    Cervical / Trunk Assessment Cervical / Trunk Assessment: Kyphotic  Communication   Communication: No difficulties  Cognition Arousal/Alertness: Awake/alert Behavior During Therapy: WFL for tasks assessed/performed Overall Cognitive Status: Within Functional  Limits for tasks assessed                                          General Comments      Exercises     Assessment/Plan    PT Assessment Patient needs continued PT services  PT Problem List Decreased strength;Decreased activity tolerance;Decreased balance;Decreased mobility       PT Treatment Interventions DME instruction;Gait training;Stair training;Functional mobility training;Therapeutic activities;Therapeutic exercise;Patient/family education;Balance training    PT Goals (Current goals can be found in the Care Plan section)  Acute Rehab PT Goals Patient Stated Goal: return home with family to assist PT Goal Formulation: With patient Time For Goal Achievement: 09/09/22 Potential to Achieve Goals: Good    Frequency Min 3X/week     Co-evaluation               AM-PAC PT "6 Clicks" Mobility  Outcome Measure Help needed turning from your back to your side while in a flat bed without using bedrails?: None Help needed moving from lying on your back to sitting on the side of a flat bed without using bedrails?: A Little Help needed moving to and  from a bed to a chair (including a wheelchair)?: A Lot Help needed standing up from a chair using your arms (e.g., wheelchair or bedside chair)?: A Little Help needed to walk in hospital room?: A Lot Help needed climbing 3-5 steps with a railing? : A Lot 6 Click Score: 16    End of Session Equipment Utilized During Treatment: Oxygen Activity Tolerance: Patient tolerated treatment well;Patient limited by fatigue Patient left: in chair;with call bell/phone within reach Nurse Communication: Mobility status PT Visit Diagnosis: Unsteadiness on feet (R26.81);Other abnormalities of gait and mobility (R26.89);Muscle weakness (generalized) (M62.81)    Time: 1610-9604 PT Time Calculation (min) (ACUTE ONLY): 24 min   Charges:   PT Evaluation $PT Eval Moderate Complexity: 1 Mod PT Treatments $Therapeutic Activity:  23-37 mins        2:35 PM, 08/26/22 Ocie Bob, MPT Physical Therapist with [facility] [phone] office [phone] mobile phone

## 2022-08-26 NOTE — Plan of Care (Signed)
  Problem: Acute Rehab PT Goals(only PT should resolve) Goal: Pt Will Go Supine/Side To Sit Outcome: Progressing Flowsheets (Taken 08/26/2022 1436) Pt will go Supine/Side to Sit:  with min guard assist  with supervision Goal: Patient Will Transfer Sit To/From Stand Outcome: Progressing Flowsheets (Taken 08/26/2022 1436) Patient will transfer sit to/from stand:  with minimal assist  with min guard assist Goal: Pt Will Transfer Bed To Chair/Chair To Bed Outcome: Progressing Flowsheets (Taken 08/26/2022 1436) Pt will Transfer Bed to Chair/Chair to Bed:  min guard assist  with min assist Goal: Pt Will Ambulate Outcome: Progressing Flowsheets (Taken 08/26/2022 1436) Pt will Ambulate:  50 feet  with minimal assist  with rolling walker   2:37 PM, 08/26/22 Allison Rollins, MPT Physical Therapist with [facility] [phone] office [phone] mobile phone

## 2022-08-26 NOTE — Plan of Care (Signed)

## 2022-08-27 ENCOUNTER — Telehealth (HOSPITAL_COMMUNITY): Payer: Self-pay | Admitting: Vascular Surgery

## 2022-08-27 DIAGNOSIS — I4891 Unspecified atrial fibrillation: Secondary | ICD-10-CM | POA: Diagnosis not present

## 2022-08-27 DIAGNOSIS — I5023 Acute on chronic systolic (congestive) heart failure: Secondary | ICD-10-CM | POA: Diagnosis not present

## 2022-08-27 LAB — BASIC METABOLIC PANEL
Anion gap: 8 (ref 5–15)
BUN: 19 mg/dL (ref 8–23)
CO2: 31 mmol/L (ref 22–32)
Calcium: 8.4 mg/dL — ABNORMAL LOW (ref 8.9–10.3)
Chloride: 99 mmol/L (ref 98–111)
Creatinine, Ser: 0.96 mg/dL (ref 0.44–1.00)
GFR, Estimated: >60 mL/min (ref >60)
Glucose, Bld: 123 mg/dL — ABNORMAL HIGH (ref 70–99)
Potassium: 3.5 mmol/L (ref 3.5–5.1)
Sodium: 138 mmol/L (ref 135–145)

## 2022-08-27 LAB — MAGNESIUM: Magnesium: 1.9 mg/dL (ref 1.7–2.4)

## 2022-08-27 MED ORDER — FUROSEMIDE 40 MG PO TABS
40.0000 mg | ORAL_TABLET | Freq: Every day | ORAL | Status: DC
  Filled 2022-08-27: qty 1

## 2022-08-27 MED ORDER — AMIODARONE HCL 200 MG PO TABS
200.0000 mg | ORAL_TABLET | Freq: Two times a day (BID) | ORAL | Status: DC
  Administered 2022-08-27 – 2022-08-28 (×3): 200 mg via ORAL
  Filled 2022-08-27 (×3): qty 1

## 2022-08-27 NOTE — Telephone Encounter (Signed)
Pt is admitted in hosp @ [facility] will try back later to make new chf appt

## 2022-08-27 NOTE — Progress Notes (Signed)
PROGRESS NOTE    Allison Rollins  ZOX:[id number] DOB: 02/17/1951 DOA: 08/24/2022 PCP: Gabriel Earing, FNP    Brief Narrative:   Allison Rollins is a 72 y.o. female with medical history significant of anxiety, CHF, COPD, depression, hypertension, Parkinson disease, atrial fibrillation, and more presents the ED with a chief complaint of 2 days of chest pain and dyspnea.  Patient describes her chest pain as a smothering feeling.  She reports it has been constant.  She has had associated palpitations and shortness of breath.  She reports that laying flat makes it worse.  She is also noticed progressive peripheral edema.  Patient has a prescription for Lasix but she reports she only takes it as needed.  She did take it on the same day as presentation but not the day before.  Patient noticed good urine output response to the Lasix that she took.  Patient reports she has been taking her amiodarone and metoprolol as well as Eliquis as prescribed.  She has no chest pain now.  She reports she has had similar episodes in the past, with the same smothering chest discomfort.   Assessment and Plan: 1) Atrial fibrillation with RVR (HCC) - Heart rate in the 130s at presentation -Rate control improved significantly on IV amiodarone as of 08/27/2022 transition to oral amiodarone (transition to oral amio 200mg  bid x 3 weeks then 200mg  daily) -Continue Eliquis -Outpatient follow-up with cardiologist may need cardioversion as outpatient if remains difficult to control  Essential hypertension - Continue metoprolol- with holding parameters  Dyslipidemia - Continue Lipitor  Abnormal chest x-ray - Concern for possible early infiltrate on chest x-ray - No leukocytosis, no fever - Dyspnea is better explained by CHF - No indication for antibiotics at this time with negative procalcitonin  Chronic respiratory failure with hypoxia (HCC) - Patient wears 3 L nasal cannula at home -Oxygen requirement currently at  baseline  Acute combined systolic and diastolic heart failure (HCC) - Last echo 1 month ago - Ejection fraction was 40 to 45% with mildly decreased LV function, global hypokinesis and indeterminate diastolic parameters - Monitor intake and output - Lasix held 5/2- negative 1.5L - Patient reports that she only takes Lasix as needed at home, the first time she has taken Lasix in a while was that same day of presentation continue -Cardiologist recommends possible restart of Lasix at 40 mg daily on 08/28/2022  Gastroesophageal reflux disease with esophagitis - Continue Protonix  Chronic obstructive pulmonary disease (HCC) - Holding albuterol in the setting of RVR and absence of wheeze  Generalized anxiety disorder - Continue Zoloft  H/o orthostatic hypotension -continue midodrine - Generalized weakness and deconditioning/ambulatory dysfunction--- physical therapy eval appreciated recommends SNF rehab -Patient is contemplating possible discharge home with home health instead of going to SNF   DVT prophylaxis: SCDs Start: 08/25/22 0123 apixaban (ELIQUIS) tablet 5 mg    Code Status: Full Code   Disposition Plan:  Level of care: Stepdown Status is: Inpatient Remains inpatient appropriate because: needs control of a fib    Consultants:  cards   Subjective: -Patient with dyspnea on exertion -Some palpitations with activity -No chest pains or significant dizziness  Objective: Vitals:   08/27/22 1300 08/27/22 1400 08/27/22 1500 08/27/22 1641  BP: 101/74  (!) 116/54   Pulse: (!) 117 60 (!) 101   Resp: 17 15 (!) 21   Temp:    97.8 F (36.6 C)  TempSrc:    Oral  SpO2: 97% 92% 94%  Weight:      Height:        Intake/Output Summary (Last 24 hours) at 08/27/2022 1905 Last data filed at 08/27/2022 1300 Gross per 24 hour  Intake 929.16 ml  Output 2450 ml  Net -1520.84 ml   Filed Weights   08/24/22 2128 08/25/22 0130 08/27/22 0458  Weight: 91.2 kg 93.5 kg 95.4 kg     Physical Exam  Gen:- Awake Alert, in no acute distress , no conversational dyspnea but has dyspnea on exertion HEENT:- [place].AT, No sclera icterus Neck-Supple Neck,No JVD,.  Lungs-  CTAB , fair air movement bilaterally  CV- S1, S2 normal, irregularly irregular Abd-  +ve B.Sounds, Abd Soft, No tenderness,    Extremity/Skin:- No significant edema,   good pedal pulses  Psych-affect is appropriate, oriented x3 Neuro-generalized weakness no new focal deficits, no tremors       Data Reviewed: I have personally reviewed following labs and imaging studies  CBC: Recent Labs  Lab 08/24/22 2208 08/25/22 0515 08/26/22 0522  WBC 8.0 7.3 7.6  NEUTROABS 6.2 5.3  --   HGB 13.1 11.9* 12.4  HCT 40.4 37.1 38.9  MCV 92.2 92.3 93.5  PLT 198 168 176   Basic Metabolic Panel: Recent Labs  Lab 08/24/22 2208 08/25/22 0515 08/26/22 0522 08/26/22 0546 08/27/22 0416  NA 140 140 139  --  138  K 4.0 3.1* 3.7  --  3.5  CL 100 99 98  --  99  CO2 27 30 32  --  31  GLUCOSE 99 85 132*  --  123*  BUN 14 13 17   --  19  CREATININE 1.13* 1.08* 1.24*  --  0.96  CALCIUM 8.9 8.4* 8.4*  --  8.4*  MG  --  1.6*  --  2.0 1.9   GFR: Estimated Creatinine Clearance: 61.6 mL/min (by C-G formula based on SCr of 0.96 mg/dL). Liver Function Tests: Recent Labs  Lab 08/24/22 2208 08/25/22 0515  AST 20 15  ALT 16 15  ALKPHOS 71 59  BILITOT 1.6* 1.5*  PROT 6.5 5.5*  ALBUMIN 3.3* 2.9*   Coagulation Profile: Recent Labs  Lab 08/24/22 2208  INR 1.3*   Thyroid Function Tests: Recent Labs    08/25/22 0515  TSH 4.724*  FREET4 1.52*    Sepsis Labs: Recent Labs  Lab 08/25/22 0515  PROCALCITON <0.10    Scheduled Meds:  amiodarone  200 mg Oral BID   apixaban  5 mg Oral BID   atorvastatin  40 mg Oral Daily   Chlorhexidine Gluconate Cloth  6 each Topical Daily   famotidine  20 mg Oral Daily   fluticasone  2 spray Each Nare Daily   [START ON 08/28/2022] furosemide  40 mg Oral Daily   gabapentin   600 mg Oral TID   metoprolol tartrate  12.5 mg Oral BID   midodrine  10 mg Oral TID WC   pantoprazole  40 mg Oral BID   pramipexole  0.5 mg Oral QHS   sertraline  200 mg Oral Daily   Continuous Infusions:   LOS: 3 days   Shon Hale, MD [facility] Available via Epic secure chat 7am-7pm After these hours, please refer to coverage provider listed on [email or web address] 08/27/2022, 7:05 PM

## 2022-08-27 NOTE — TOC Progression Note (Signed)
Transition of Care [facility]) - Progression Note    Patient Details  Name: Allison Rollins MRN: [id number] Date of Birth: 1950/07/16  [facility]) CM/SW Contact  Elliot Gault, LCSW Phone Number: 08/27/2022, 11:59 AM  Clinical Narrative:     TOC following. Spoke with pt to update on bed offer from [facility]. Pt now saying that she wants to return home with resumption of HH from [facility]. Pt has spoken with her son and he is in agreement.   Updated MD and requested HH RN/PT/OT/Aide orders for dc.  Plan is for dc later today or tomorrow.  Expected Discharge Plan: Home w Home Health Services Barriers to Discharge: Continued Medical Work up  Expected Discharge Plan and Services In-house Referral: Clinical Social Work Discharge Planning Services: CM Consult Post Acute Care Choice: Home Health Living arrangements for the past 2 months: Single Family Home                                       Social Determinants of Health (SDOH) Interventions SDOH Screenings   Food Insecurity: No Food Insecurity (08/25/2022)  Housing: Low Risk  (08/25/2022)  Transportation Needs: No Transportation Needs (08/25/2022)  Utilities: Not At Risk (08/25/2022)  Alcohol Screen: Low Risk  (04/16/2022)  Depression (PHQ2-9): Low Risk  (04/16/2022)  Recent Concern: Depression (PHQ2-9) - High Risk (02/24/2022)  Financial Resource Strain: Medium Risk (04/16/2022)  Physical Activity: Insufficiently Active (04/16/2022)  Social Connections: Moderately Integrated (04/16/2022)  Recent Concern: Social Connections - Socially Isolated (03/24/2022)  Stress: No Stress Concern Present (04/16/2022)  Tobacco Use: Medium Risk (08/26/2022)    Readmission Risk Interventions    08/25/2022   12:32 PM 08/13/2022    1:59 PM  Readmission Risk Prevention Plan  Transportation Screening Complete Complete  PCP or Specialist Appt within 5-7 Days  Not Complete  Home Care Screening  Complete  Medication Review (RN CM)  Complete   HRI or Home Care Consult Complete   Social Work Consult for Recovery Care Planning/Counseling Complete   Palliative Care Screening Not Applicable   Medication Review Oceanographer) Complete

## 2022-08-27 NOTE — Progress Notes (Signed)
Rounding Note    Patient Name: MAERYN ETCHISON Date of Encounter: 08/27/2022  [facility] Cardiologist: Dina Rich, MD   Subjective   No complaints  Inpatient Medications    Scheduled Meds:  apixaban  5 mg Oral BID   atorvastatin  40 mg Oral Daily   Chlorhexidine Gluconate Cloth  6 each Topical Daily   famotidine  20 mg Oral Daily   fluticasone  2 spray Each Nare Daily   gabapentin  600 mg Oral TID   metoprolol tartrate  12.5 mg Oral BID   midodrine  10 mg Oral TID WC   pantoprazole  40 mg Oral BID   pramipexole  0.5 mg Oral QHS   sertraline  200 mg Oral Daily   Continuous Infusions:  amiodarone 30 mg/hr (08/26/22 2206)   PRN Meds: acetaminophen **OR** acetaminophen, morphine injection, ondansetron **OR** ondansetron (ZOFRAN) IV, mouth rinse, oxyCODONE   Vital Signs    Vitals:   08/27/22 0530 08/27/22 0600 08/27/22 0630 08/27/22 0700  BP: (!) 93/50 (!) 88/53 (!) 86/63 97/73  Pulse: (!) 116 (!) 53 66 81  Resp: 13 13 14 19   Temp:      TempSrc:      SpO2: 96% 100% 99% 100%  Weight:      Height:        Intake/Output Summary (Last 24 hours) at 08/27/2022 0819 Last data filed at 08/27/2022 0458 Gross per 24 hour  Intake 670.8 ml  Output 2300 ml  Net -1629.2 ml      08/27/2022    4:58 AM 08/25/2022    1:30 AM 08/24/2022    9:28 PM  Last 3 Weights  Weight (lbs) 210 lb 5.1 oz 206 lb 2.1 oz 201 lb  Weight (kg) 95.4 kg 93.5 kg 91.173 kg      Telemetry    Afib 70s to 80s - Personally Reviewed  ECG    N/a - Personally Reviewed  Physical Exam   GEN: No acute distress.   Neck: No JVD Cardiac: irreg Respiratory: Clear to auscultation bilaterally. GI: Soft, nontender, non-distended  MS: No edema; No deformity. Neuro:  Nonfocal  Psych: Normal affect   Labs    High Sensitivity Troponin:   Recent Labs  Lab 08/10/22 1359 08/10/22 1515 08/24/22 2208 08/24/22 2358  TROPONINIHS 11 9 12 14      Chemistry Recent Labs  Lab 08/24/22 2208  08/25/22 0515 08/26/22 0522 08/26/22 0546 08/27/22 0416  NA 140 140 139  --  138  K 4.0 3.1* 3.7  --  3.5  CL 100 99 98  --  99  CO2 27 30 32  --  31  GLUCOSE 99 85 132*  --  123*  BUN 14 13 17   --  19  CREATININE 1.13* 1.08* 1.24*  --  0.96  CALCIUM 8.9 8.4* 8.4*  --  8.4*  MG  --  1.6*  --  2.0 1.9  PROT 6.5 5.5*  --   --   --   ALBUMIN 3.3* 2.9*  --   --   --   AST 20 15  --   --   --   ALT 16 15  --   --   --   ALKPHOS 71 59  --   --   --   BILITOT 1.6* 1.5*  --   --   --   GFRNONAA 52* 55* 46*  --  >60  ANIONGAP 13 11 9   --  8    Lipids No results for input(s): "CHOL", "TRIG", "HDL", "LABVLDL", "LDLCALC", "CHOLHDL" in the last 168 hours.  Hematology Recent Labs  Lab 08/24/22 2208 08/25/22 0515 08/26/22 0522  WBC 8.0 7.3 7.6  RBC 4.38 4.02 4.16  HGB 13.1 11.9* 12.4  HCT 40.4 37.1 38.9  MCV 92.2 92.3 93.5  MCH 29.9 29.6 29.8  MCHC 32.4 32.1 31.9  RDW 15.3 15.1 15.5  PLT 198 168 176   Thyroid  Recent Labs  Lab 08/25/22 0515  TSH 4.724*  FREET4 1.52*    BNP Recent Labs  Lab 08/24/22 2208  BNP 1,029.0*    DDimer No results for input(s): "DDIMER" in the last 168 hours.   Radiology    No results found.  Cardiac Studies     Patient Profile     DIONNA KRAUTKRAMER is a 72 y.o. 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 Carren Rang.   Assessment & Plan    1.Persistent afib - presented with afib with RVR. On further history appears primary issues was poor medication compliance as opposed to medication failure.  -management complicated by orthostatic hypotension last admission, had been on midodrine. Also prior to afib issues she had chronic sinus bradycardia, careful use of av nodal agents, currently on lopressor 12.5mg  bid - she is on eliquis for  stroke prevention   - oral amio started 07/14/22. Has never had IV load. She previously turned down DCCV - Started on IV amiodarone 08/25/22 - missed doses of eliquis, would require TEE/DCCV if she were to agree. BP's and respiratory status chronically fragile, would be higher risk. Hopefully will chemically convert, if not control rates and ideally compliant with eliquis for 3 weeks where could do just DCCV alone. I think would be tenous for TEE portion from respiratory standpoint but could likely do the DCCV portion. I see no reason for inpaiten TEE/DCCV given high risks and the fact she is currently rate controlle.  - rates well controlled, transition to oral amio 200mg  bid x 3 weeks then 200mg  daily.  - continue eliquis    2. Acute on chronic HFmrEF - 06/2022 echo LVEF 40-45%, indet diastolic, mild RV dysfunction  - CXR increasing left retrocardiac infiltrate. BNP 1029   -negative 1.6 L yesterday and 3.5 L thus far on admission, net negative yesterday without any diuretic. Uptrend in Cr yesterday now resolved.  - medical therapy limited by low bp's. Frequent UTIs and yeast infections not on SGLT2i - was not taking her lasix regularly at home.  - euvoelmic by exam, SOB reasolved. Soft bp's, hold diuretic today and start oral 40mg  daily tomorrow.      3.Orthostatic hypotension - continue midodrine. Increase back to home dose of 10mg  tid.  Monitor HRs, bp's today. If stable likely could d/c tomorrow. Keep f/u 5/17.   For questions or updates, please contact [facility] Please consult [email or web address] for contact info under        Signed, Dina Rich, MD  08/27/2022, 8:19 AM

## 2022-08-27 NOTE — Progress Notes (Signed)
Physical Therapy Treatment Patient Details Name: Allison Rollins MRN: [id number] DOB: 14-Apr-1951 Today's Date: 08/27/2022   History of Present Illness Allison Rollins is a 72 y.o. female with medical history significant of anxiety, CHF, COPD, depression, hypertension, Parkinson disease, atrial fibrillation, and more presents the ED with a chief complaint of 2 days of chest pain and dyspnea.  Patient describes her chest pain as a smothering feeling.  She reports it has been constant.  She has had associated palpitations and shortness of breath.  She reports that laying flat makes it worse.  She is also noticed progressive peripheral edema.  Patient has a prescription for Lasix but she reports she only takes it as needed.  She did take it on the same day as presentation but not the day before.  Patient noticed good urine output response to the Lasix that she took.  Patient reports she has been taking her amiodarone and metoprolol as well as Eliquis as prescribed.  She has no chest pain now.  She reports she has had similar episodes in the past, with the same smothering chest discomfort.  Review of systems patient reports she did have diarrhea the day before yesterday.  It was several episodes.  She reports she has not had any appetite so she has not been eating and attributed her loose stools to that.  Patient reports that she has lost 25 pounds in 2 weeks, which is concerning because she is also gained fluid.  So she is losing weight she is lost much more than that when you account for the fluid.  Patient denies dysuria.  She has no other complaints at this time including no cough no fever.    PT Comments    Patient presents in chair (assisted by nursing staff) and agreeable for therapy. Patient demonstrates slow labored movement with difficulty transferring to/from commode due to BLE weakness and able to have a bowel movement prior to gait training.  Patient demonstrated increased endurance/distance for gait  training with slow labored unsteady cadence, flexed trunk and difficulty making turns requiring repeated verbal/tactile cueing for safety.  Patient tolerated staying up in chair after therapy - RN aware.  Patient will benefit from continued skilled physical therapy in hospital and recommended venue below to increase strength, balance, endurance for safe ADLs and gait.    Recommendations for follow up therapy are one component of a multi-disciplinary discharge planning process, led by the attending physician.  Recommendations may be updated based on patient status, additional functional criteria and insurance authorization.  Follow Up Recommendations  Can patient physically be transported by private vehicle: Yes    Assistance Recommended at Discharge Set up Supervision/Assistance  Patient can return home with the following A lot of help with walking and/or transfers;A little help with bathing/dressing/bathroom;Help with stairs or ramp for entrance;Assistance with cooking/housework   Equipment Recommendations  None recommended by PT    Recommendations for Other Services       Precautions / Restrictions Precautions Precautions: Fall Restrictions Weight Bearing Restrictions: No     Mobility  Bed Mobility               General bed mobility comments: Patient presents seated in chair (assisted by nursing staff)    Transfers Overall transfer level: Needs assistance Equipment used: Rolling walker (2 wheels) Transfers: Sit to/from Stand, Bed to chair/wheelchair/BSC Sit to Stand: Min assist   Step pivot transfers: Min assist, Mod assist       General transfer comment:  unsteady labored movement, flexed trunk and had most diffiuclty transferring to/from commode due to BLE weakness    Ambulation/Gait Ambulation/Gait assistance: Min assist, Mod assist Gait Distance (Feet): 35 Feet Assistive device: Rolling walker (2 wheels) Gait Pattern/deviations: Decreased step length - left,  Decreased stance time - right, Decreased stride length, Trunk flexed Gait velocity: decreased     General Gait Details: demonstrates increased endurance/distance for ambulation with slow labored movement, frequent verbal/tactile cueing for safety especially when making turns, limited most due to fatigue and SpO2 dropping from 95% to 85% on 3 LPM O2   Stairs             Wheelchair Mobility    Modified Rankin (Stroke Patients Only)       Balance Overall balance assessment: Needs assistance Sitting-balance support: Feet supported, No upper extremity supported Sitting balance-Leahy Scale: Fair Sitting balance - Comments: fair/good seated in chair   Standing balance support: Reliant on assistive device for balance, During functional activity, Bilateral upper extremity supported Standing balance-Leahy Scale: Fair Standing balance comment: using RW                            Cognition Arousal/Alertness: Awake/alert Behavior During Therapy: WFL for tasks assessed/performed Overall Cognitive Status: Within Functional Limits for tasks assessed                                          Exercises General Exercises - Lower Extremity Long Arc Quad: Seated, AROM, Strengthening, Both, 10 reps Hip Flexion/Marching: Seated, AROM, Strengthening, Both, 10 reps Toe Raises: Seated, AROM, Strengthening, Both, 10 reps Heel Raises: Seated, AROM, Strengthening, Both, 10 reps    General Comments        Pertinent Vitals/Pain Pain Assessment Pain Assessment: No/denies pain    Home Living                          Prior Function            PT Goals (current goals can now be found in the care plan section) Acute Rehab PT Goals Patient Stated Goal: return home with family to assist PT Goal Formulation: With patient Time For Goal Achievement: 09/09/22 Potential to Achieve Goals: Good Progress towards PT goals: Progressing toward goals     Frequency    Min 3X/week      PT Plan Current plan remains appropriate    Co-evaluation              AM-PAC PT "6 Clicks" Mobility   Outcome Measure  Help needed turning from your back to your side while in a flat bed without using bedrails?: None Help needed moving from lying on your back to sitting on the side of a flat bed without using bedrails?: A Little Help needed moving to and from a bed to a chair (including a wheelchair)?: A Lot Help needed standing up from a chair using your arms (e.g., wheelchair or bedside chair)?: A Lot Help needed to walk in hospital room?: A Lot Help needed climbing 3-5 steps with a railing? : A Lot 6 Click Score: 15    End of Session Equipment Utilized During Treatment: Oxygen Activity Tolerance: Patient tolerated treatment well;Patient limited by fatigue Patient left: in chair;with call bell/phone within reach Nurse Communication: Mobility status PT Visit Diagnosis:  Unsteadiness on feet (R26.81);Other abnormalities of gait and mobility (R26.89);Muscle weakness (generalized) (M62.81)     Time: 1610-9604 PT Time Calculation (min) (ACUTE ONLY): 32 min  Charges:  $Gait Training: 8-22 mins $Therapeutic Exercise: 8-22 mins                     3:33 PM, 08/27/22 Ocie Bob, MPT Physical Therapist with [facility] [phone] office [phone] mobile phone

## 2022-08-28 DIAGNOSIS — I4891 Unspecified atrial fibrillation: Secondary | ICD-10-CM | POA: Diagnosis not present

## 2022-08-28 LAB — GLUCOSE, CAPILLARY
Glucose-Capillary: 95 mg/dL (ref 70–99)
Glucose-Capillary: 115 mg/dL — ABNORMAL HIGH (ref 70–99)

## 2022-08-28 LAB — BASIC METABOLIC PANEL
Anion gap: 6 (ref 5–15)
BUN: 18 mg/dL (ref 8–23)
CO2: 33 mmol/L — ABNORMAL HIGH (ref 22–32)
Calcium: 8.6 mg/dL — ABNORMAL LOW (ref 8.9–10.3)
Chloride: 100 mmol/L (ref 98–111)
Creatinine, Ser: 0.89 mg/dL (ref 0.44–1.00)
GFR, Estimated: >60 mL/min (ref >60)
Glucose, Bld: 119 mg/dL — ABNORMAL HIGH (ref 70–99)
Potassium: 3.7 mmol/L (ref 3.5–5.1)
Sodium: 139 mmol/L (ref 135–145)

## 2022-08-28 MED ORDER — APIXABAN 5 MG PO TABS: 5.0000 mg | ORAL_TABLET | Freq: Two times a day (BID) | ORAL | 4 refills | Status: AC

## 2022-08-28 MED ORDER — SERTRALINE HCL 100 MG PO TABS
200.0000 mg | ORAL_TABLET | Freq: Every day | ORAL | 3 refills | Status: AC
Start: 2022-08-28 — End: ?

## 2022-08-28 MED ORDER — MIDODRINE HCL 10 MG PO TABS: 10.0000 mg | ORAL_TABLET | Freq: Three times a day (TID) | ORAL | 3 refills | Status: AC

## 2022-08-28 MED ORDER — PANTOPRAZOLE SODIUM 40 MG PO TBEC
40.0000 mg | DELAYED_RELEASE_TABLET | Freq: Two times a day (BID) | ORAL | 3 refills | Status: DC
Start: 2022-08-28 — End: 2022-09-10

## 2022-08-28 MED ORDER — AMIODARONE HCL 200 MG PO TABS: 200.0000 mg | ORAL_TABLET | Freq: Every day | ORAL | 1 refills | Status: AC

## 2022-08-28 MED ORDER — ATORVASTATIN CALCIUM 40 MG PO TABS
40.0000 mg | ORAL_TABLET | Freq: Every day | ORAL | 5 refills | Status: DC
Start: 2022-08-28 — End: 2022-10-07

## 2022-08-28 MED ORDER — ACETAMINOPHEN 325 MG PO TABS: 650.0000 mg | ORAL_TABLET | Freq: Four times a day (QID) | ORAL | 1 refills | Status: AC | PRN

## 2022-08-28 MED ORDER — FUROSEMIDE 20 MG PO TABS: 20.0000 mg | ORAL_TABLET | Freq: Every day | ORAL | 1 refills | Status: AC

## 2022-08-28 MED ORDER — METOPROLOL TARTRATE 25 MG PO TABS: 12.5000 mg | ORAL_TABLET | Freq: Two times a day (BID) | ORAL | 4 refills | Status: AC

## 2022-08-28 MED ORDER — PRAMIPEXOLE DIHYDROCHLORIDE 0.5 MG PO TABS
0.5000 mg | ORAL_TABLET | Freq: Every day | ORAL | 2 refills | Status: DC
Start: 2022-08-28 — End: 2022-11-09

## 2022-08-28 NOTE — TOC Transition Note (Signed)
Transition of Care [facility]) - CM/SW Discharge Note   Patient Details  Name: Allison Rollins MRN: [id number] Date of Birth: 11/05/1950  [facility] CM/SW Contact:  Catalina Gravel, LCSW Phone Number: 08/28/2022, 12:41 PM   Clinical Narrative:    CSW contacted [facility] agency after verifying orders in, spoke to Maralyn Sago advised DC. CSW contacted [facility] to arrange transport for pt, adding her to list. Son requested call from RN once mom leaves hospital, to ensure he meets ambulance.  CSW advised RN. Pt has 02 in place-CSW will attempt to advise provider as well. DC today.    Final next level of care: Home w Home Health Services Barriers to Discharge: No Barriers Identified   Patient Goals and CMS Choice CMS Medicare.gov Compare Post Acute Care list provided to:: Patient Choice offered to / list presented to : Patient  Discharge Placement                    Name of family member notified: Caroll Rancher and daughter in law    Discharge Plan and Services Additional resources added to the After Visit Summary for   In-house Referral: Clinical Social Work Discharge Planning Services: CM Consult Post Acute Care Choice: Home Health                    HH Arranged: PT, OT, RN Producer, television/film/video contacted) HH Agency:  Producer, television/film/video) Date HH Agency Contacted: 08/28/22 Time HH Agency Contacted: 1241 Representative spoke with at [facility] Agency: Maralyn Sago  Social Determinants of Health (SDOH) Interventions SDOH Screenings   Food Insecurity: No Food Insecurity (08/25/2022)  Housing: Low Risk  (08/25/2022)  Transportation Needs: No Transportation Needs (08/25/2022)  Utilities: Not At Risk (08/25/2022)  Alcohol Screen: Low Risk  (04/16/2022)  Depression (PHQ2-9): Low Risk  (04/16/2022)  Recent Concern: Depression (PHQ2-9) - High Risk (02/24/2022)  Financial Resource Strain: Medium Risk (04/16/2022)  Physical Activity: Insufficiently Active (04/16/2022)  Social Connections: Moderately Integrated (04/16/2022)   Recent Concern: Social Connections - Socially Isolated (03/24/2022)  Stress: No Stress Concern Present (04/16/2022)  Tobacco Use: Medium Risk (08/26/2022)     Readmission Risk Interventions    08/25/2022   12:32 PM 08/13/2022    1:59 PM  Readmission Risk Prevention Plan  Transportation Screening Complete Complete  PCP or Specialist Appt within 5-7 Days  Not Complete  Home Care Screening  Complete  Medication Review (RN CM)  Complete  HRI or Home Care Consult Complete   Social Work Consult for Recovery Care Planning/Counseling Complete   Palliative Care Screening Not Applicable   Medication Review Oceanographer) Complete

## 2022-08-28 NOTE — Discharge Summary (Signed)
Allison Rollins, is a 72 y.o. female  DOB January 02, 1951  MRN [id number].  Admission date:  08/24/2022  Admitting Physician  Lilyan Gilford, DO  Discharge Date:  08/28/2022   Primary MD  Gabriel Earing, FNP  Recommendations for primary care physician for things to follow:   1)Very low-salt diet advised--- less than 2 g of sodium advised per day 2)Weigh yourself daily, call if you gain more than 3 pounds in 1 day or more than 5 pounds in 1 week as your diuretic medications may need to be adjusted 3)Limit your Fluid  intake to no more than 50 ounces (1.5 Liters) per day 4)You are taking Eliquis/apixaban which is a blood thinner so please Avoid ibuprofen/Advil/Aleve/Motrin/Goody Powders/Naproxen/BC powders/Meloxicam/Diclofenac/Indomethacin and other Nonsteroidal anti-inflammatory medications as these will make you more likely to bleed and can cause stomach ulcers, can also cause Kidney problems.  5)Repeat BMP and CBC in about 1 week. 6)Keep your Appointment with your cardiologist for 09/10/2022  Admission Diagnosis  Atrial fibrillation with rapid ventricular response (HCC) [I48.91] Atrial fibrillation with RVR (HCC) [I48.91]   Discharge Diagnosis  Atrial fibrillation with rapid ventricular response (HCC) [I48.91] Atrial fibrillation with RVR (HCC) [I48.91]    Principal Problem:   Atrial fibrillation with RVR (HCC) Active Problems:   Essential hypertension   Dyslipidemia   Generalized anxiety disorder   Chronic obstructive pulmonary disease (HCC)   Gastroesophageal reflux disease with esophagitis   Acute combined systolic and diastolic heart failure (HCC)   Chronic respiratory failure with hypoxia (HCC)   Abnormal chest x-ray      Past Medical History:  Diagnosis Date   Anxiety    Atrial fibrillation (HCC) 2023   Eliquis   Chest pain 12/22/2015   CHF (congestive heart failure) (HCC)    COPD  (chronic obstructive pulmonary disease) (HCC)    Depression    Headache    HTN (hypertension)    Hypokalemia 07/28/2013   Kidney stones    Osteopenia 01/22/2020   Parkinson's disease    Parkinsonian syndrome associated with symptomatic orthostatic hypotension    Midodrine   Pneumonia    Pre-diabetes    Requires continuous at home supplemental oxygen    3L [place]   S/P colonoscopy 10/15/2004   normal   Vitamin D deficiency 11/24/2018    Past Surgical History:  Procedure Laterality Date   CHOLECYSTECTOMY     COLONOSCOPY  10/15/2004   Normal rectum/Normal colon   COLONOSCOPY N/A 05/10/2016   Procedure: COLONOSCOPY;  Surgeon: West Bali, MD;  Location: [facility];  Service: Endoscopy;  Laterality: N/A;  10:30   ESOPHAGEAL MANOMETRY N/A 04/02/2019   Procedure: ESOPHAGEAL MANOMETRY (EM);  Surgeon: Napoleon Form, MD;  Location: [facility];  Service: Endoscopy;  Laterality: N/A;   ESOPHAGOGASTRODUODENOSCOPY  09/11/2010   Dysphagia likely multifactorial (possible Candida esophagitis, likely nonspecific esophageal motility disorder, and/or uncontrolled gastroesophageal reflux disease), status post empiric dilation   ESOPHAGOGASTRODUODENOSCOPY N/A 11/13/2018   Dr. Darrick Penna: No endoscopic esophageal abnormality to explain patient's  dysphagia.  Esophagus dilated.  Moderate erosive/nodular gastritis with benign biopsies.   KIDNEY STONE SURGERY     SAVORY DILATION N/A 11/13/2018   Procedure: SAVORY DILATION;  Surgeon: West Bali, MD;  Location: [facility];  Service: Endoscopy;  Laterality: N/A;     HPI  from the history and physical done on the day of admission:   HPI: Allison Rollins is a 72 y.o. female with medical history significant of anxiety, CHF, COPD, depression, hypertension, Parkinson disease, atrial fibrillation, and more presents the ED with a chief complaint of 2 days of chest pain and dyspnea.  Patient describes her chest pain as a smothering feeling.  She reports it  has been constant.  She has had associated palpitations and shortness of breath.  She reports that laying flat makes it worse.  She is also noticed progressive peripheral edema.  Patient has a prescription for Lasix but she reports she only takes it as needed.  She did take it on the same day as presentation but not the day before.  Patient noticed good urine output response to the Lasix that she took.  Patient reports she has been taking her amiodarone and metoprolol as well as Eliquis as prescribed.  She has no chest pain now.  She reports she has had similar episodes in the past, with the same smothering chest discomfort.  Review of systems patient reports she did have diarrhea the day before yesterday.  It was several episodes.  She reports she has not had any appetite so she has not been eating and attributed her loose stools to that.  Patient reports that she has lost 25 pounds in 2 weeks, which is concerning because she is also gained fluid.  So she is losing weight she is lost much more than that when you account for the fluid.  Patient denies dysuria.  She has no other complaints at this time including no cough no fever.   Patient does not smoke, does not drink, does not use illicit drugs.  She is vaccinated for COVID.  Patient is full code. Review of Systems: As mentioned in the history of present illness. All other systems reviewed and are negative.     Hospital Course:     Assessment and Plan: 1) Atrial fibrillation with RVR (HCC) - Heart rate in the 130s at presentation -Rate control improved significantly on IV amiodarone as of 08/27/2022 transition to oral amiodarone - -patient was transitioned to oral amio 200mg  bid x 3 weeks then 200mg  daily -Continue Eliquis --Low-dose metoprolol due to soft BP -For patient's appointment cardiologist on 09/09/2020 for to discuss possible cardioversion if patient remains in A-fib  Dyslipidemia - Continue Lipitor   Abnormal chest x-ray - Concern  for possible early infiltrate on chest x-ray - No leukocytosis, no fever - Dyspnea is better explained by CHF - No indication for antibiotics at this time with negative procalcitonin   Chronic respiratory failure with hypoxia (HCC) - Patient wears 3 L nasal cannula at home -Oxygen requirement currently at baseline   Acute combined systolic and diastolic heart failure (HCC) - Last echo 1 month ago - Ejection fraction was 40 to 45% with mildly decreased LV function, global hypokinesis and indeterminate diastolic parameters - Monitor intake and output - Lasix held 5/2- negative 1.5L - Patient reports that she only takes Lasix as needed at home, the first time she has taken Lasix in a while was that same day of presentation continue -Discharged home on  low-dose Lasix given soft BP--- heart failure issues improve if A-fib rate is controlled   Gastroesophageal reflux disease with esophagitis - Continue Protonix   Chronic obstructive pulmonary disease (HCC) Be judicious with bronchodilators to avoid tachycardia   Generalized anxiety disorder - Continue Zoloft   H/o orthostatic hypotension -continue midodrine - Generalized weakness and deconditioning/ambulatory dysfunction--- physical therapy eval appreciated recommends SNF rehab -Patient requested discharge home with home health instead of going to SNF  Discharge Condition: stable  Follow UP   Follow-up Information     Gabriel Earing, FNP. Schedule an appointment as soon as possible for a visit in 1 week(s).   Specialty: Family Medicine Why: Repeat CBC and BMP Blood test Contact information: [address] [place] [address] [phone]         Antoine Poche, MD. Go on 09/10/2022.   Specialty: Cardiology Contact information: [address] [place] [address] [phone]                 Consults obtained -cardiology  Diet and Activity recommendation:  As advised  Discharge Instructions    Discharge Instructions     Call MD for:  difficulty breathing, headache or visual disturbances   Complete by: As directed    Call MD for:  persistant dizziness or light-headedness   Complete by: As directed    Call MD for:  persistant nausea and vomiting   Complete by: As directed    Call MD for:  temperature >100.4   Complete by: As directed    Diet - low sodium heart healthy   Complete by: As directed    Discharge instructions   Complete by: As directed    1)Very low-salt diet advised--- less than 2 g of sodium advised per day 2)Weigh yourself daily, call if you gain more than 3 pounds in 1 day or more than 5 pounds in 1 week as your diuretic medications may need to be adjusted 3)Limit your Fluid  intake to no more than 50 ounces (1.5 Liters) per day 4)You are taking Eliquis/apixaban which is a blood thinner so please Avoid ibuprofen/Advil/Aleve/Motrin/Goody Powders/Naproxen/BC powders/Meloxicam/Diclofenac/Indomethacin and other Nonsteroidal anti-inflammatory medications as these will make you more likely to bleed and can cause stomach ulcers, can also cause Kidney problems.  5)Repeat BMP and CBC in about 1 week. 6)Keep your Appointment with your cardiologist for 09/10/2022   Increase activity slowly   Complete by: As directed    No wound care   Complete by: As directed        Discharge Medications     Allergies as of 08/28/2022       Reactions   Egg Solids, Whole    Farxiga [dapagliflozin] Swelling   Penicillins Itching, Rash   Has patient had a PCN reaction causing immediate rash, facial/tongue/throat swelling, SOB or lightheadedness with hypotension: no Has patient had a PCN reaction causing severe rash involving mucus membranes or skin necrosis: no Has patient had a PCN reaction that required hospitalization: no Has patient had a PCN reaction occurring within the last 10 years: no If all of the above answers are "NO", then may proceed with Cephalosporin use.   Sulfa  Antibiotics Rash        Medication List     TAKE these medications    Accu-Chek Aviva Plus test strip Generic drug: glucose blood Test sugars daily   acetaminophen 325 MG tablet Commonly known as: TYLENOL Take 2 tablets (650 mg total) by mouth every  6 (six) hours as needed for mild pain (or Fever >/= 101).   albuterol (2.5 MG/3ML) 0.083% nebulizer solution Commonly known as: PROVENTIL Take 3 mLs (2.5 mg total) by nebulization every 6 (six) hours as needed for wheezing or shortness of breath.   albuterol 108 (90 Base) MCG/ACT inhaler Commonly known as: VENTOLIN HFA Inhale 2 puffs into the lungs every 6 (six) hours as needed for wheezing or shortness of breath.   alum & mag hydroxide-simeth 200-200-20 MG/5ML suspension Commonly known as: MAALOX/MYLANTA Take 30 mLs by mouth every 6 (six) hours as needed for indigestion or heartburn.   amiodarone 200 MG tablet Commonly known as: PACERONE Take 1 tablet (200 mg total) by mouth daily. Take amiodarone  200mg  twice daily for  3 weeks (thru 09/18/22),  then 200mg  once daily starting Monday 09/19/22 What changed: additional instructions   apixaban 5 MG Tabs tablet Commonly known as: ELIQUIS Take 1 tablet (5 mg total) by mouth 2 (two) times daily.   atorvastatin 40 MG tablet Commonly known as: LIPITOR Take 1 tablet (40 mg total) by mouth daily.   CALCIUM PO Take 1 tablet by mouth daily.   cholecalciferol 25 MCG (1000 UNIT) tablet Commonly known as: VITAMIN D3 Take 1,000 Units by mouth daily.   famotidine 20 MG tablet Commonly known as: PEPCID Take 20 mg by mouth daily.   fluticasone 50 MCG/ACT nasal spray Commonly known as: FLONASE Use 2 sprays into each nostril every day   furosemide 20 MG tablet Commonly known as: Lasix Take 1 tablet (20 mg total) by mouth daily. What changed:  how much to take how to take this when to take this additional instructions   gabapentin 300 MG capsule Commonly known as:  NEURONTIN Take 300 mg by mouth See admin instructions. Take 600 mg by mouth three times daily and at bedtime.   metoprolol tartrate 25 MG tablet Commonly known as: LOPRESSOR Take 0.5 tablets (12.5 mg total) by mouth 2 (two) times daily.   midodrine 10 MG tablet Commonly known as: PROAMATINE Take 1 tablet (10 mg total) by mouth 3 (three) times daily with meals. For Low Blood Pressure What changed: additional instructions   nitroGLYCERIN 0.4 MG SL tablet Commonly known as: NITROSTAT Place 1 tablet (0.4 mg total) under the tongue every 5 (five) minutes as needed for chest pain.   pantoprazole 40 MG tablet Commonly known as: PROTONIX Take 1 tablet (40 mg total) by mouth 2 (two) times daily.   pramipexole 0.5 MG tablet Commonly known as: MIRAPEX Take 1 tablet (0.5 mg total) by mouth at bedtime.   sertraline 100 MG tablet Commonly known as: ZOLOFT Take 2 tablets (200 mg total) by mouth daily.   VITAMIN C PO Take 1 tablet by mouth 3 (three) times daily.       Major procedures and Radiology Reports - PLEASE review detailed and final reports for all details, in brief -   DG Chest Port 1 View  Result Date: 08/24/2022 CLINICAL DATA:  Shortness of breath and chest pain EXAM: PORTABLE CHEST 1 VIEW COMPARISON:  08/10/2022 FINDINGS: Cardiac shadow is enlarged but stable. Aortic calcifications are again seen. Chronic blunting of the left costophrenic angle is noted. Increasing left retrocardiac airspace opacity is noted consistent with early infiltrate. No bony abnormality is noted. IMPRESSION: Increasing left retrocardiac infiltrate. Electronically Signed   By: Alcide Clever M.D.   On: 08/24/2022 23:00   CT Head Wo Contrast  Result Date: 08/10/2022 CLINICAL DATA:  Fall EXAM:  CT HEAD WITHOUT CONTRAST CT CERVICAL SPINE WITHOUT CONTRAST TECHNIQUE: Multidetector CT imaging of the head and cervical spine was performed following the standard protocol without intravenous contrast. Multiplanar CT  image reconstructions of the cervical spine were also generated. RADIATION DOSE REDUCTION: This exam was performed according to the departmental dose-optimization program which includes automated exposure control, adjustment of the mA and/or kV according to patient size and/or use of iterative reconstruction technique. COMPARISON:  CT head and cervical spine 11/22/2019 FINDINGS: CT HEAD FINDINGS Brain: There is no acute intracranial hemorrhage, extra-axial fluid collection, or acute infarct. Parenchymal volume is normal for age. The ventricles are normal in size. Gray-white differentiation is preserved. There is extensive confluent hypodensity throughout the supratentorial white matter likely reflecting sequela of advanced chronic small-vessel ischemic change. 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 The pituitary and suprasellar region are normal. There is no mass lesion. There is no mass effect or midline shift. Vascular: There is calcification of the bilateral carotid siphons. Skull: Normal. Negative for fracture or focal lesion. Sinuses/Orbits: There is mild mucosal thickening in the left maxillary sinus. The globes and orbits are unremarkable. Other: None. CT CERVICAL SPINE FINDINGS Alignment: Normal. Skull base and vertebrae: Skull base alignment is maintained. Vertebral body heights are preserved. There is no evidence of acute fracture. There is no suspicious osseous lesion. Soft tissues and spinal canal: No prevertebral fluid or swelling. No visible canal hematoma. Disc levels: There is overall mild degenerative change of the cervical spine without evidence of high-grade osseous spinal canal or neural foraminal stenosis. Upper chest: The imaged portions of the lung apices are clear. Other: None. IMPRESSION: 1. No acute intracranial pathology. 2. No acute fracture or traumatic malalignment of the cervical spine. Electronically Signed   By: Lesia Hausen M.D.   On:  08/10/2022 15:04   CT Cervical Spine Wo Contrast  Result Date: 08/10/2022 CLINICAL DATA:  Fall EXAM: CT HEAD WITHOUT CONTRAST CT CERVICAL SPINE WITHOUT CONTRAST TECHNIQUE: Multidetector CT imaging of the head and cervical spine was performed following the standard protocol without intravenous contrast. Multiplanar CT image reconstructions of the cervical spine were also generated. RADIATION DOSE REDUCTION: This exam was performed according to the departmental dose-optimization program which includes automated exposure control, adjustment of the mA and/or kV according to patient size and/or use of iterative reconstruction technique. COMPARISON:  CT head and cervical spine 11/22/2019 FINDINGS: CT HEAD FINDINGS Brain: There is no acute intracranial hemorrhage, extra-axial fluid collection, or acute infarct. Parenchymal volume is normal for age. The ventricles are normal in size. Gray-white differentiation is preserved. There is extensive confluent hypodensity throughout the supratentorial white matter likely reflecting sequela of advanced chronic small-vessel ischemic change. 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 The pituitary and suprasellar region are normal. There is no mass lesion. There is no mass effect or midline shift. Vascular: There is calcification of the bilateral carotid siphons. Skull: Normal. Negative for fracture or focal lesion. Sinuses/Orbits: There is mild mucosal thickening in the left maxillary sinus. The globes and orbits are unremarkable. Other: None. CT CERVICAL SPINE FINDINGS Alignment: Normal. Skull base and vertebrae: Skull base alignment is maintained. Vertebral body heights are preserved. There is no evidence of acute fracture. There is no suspicious osseous lesion. Soft tissues and spinal canal: No prevertebral fluid or swelling. No visible canal hematoma. Disc levels: There is overall mild degenerative change of the cervical spine without  evidence of high-grade  osseous spinal canal or neural foraminal stenosis. Upper chest: The imaged portions of the lung apices are clear. Other: None. IMPRESSION: 1. No acute intracranial pathology. 2. No acute fracture or traumatic malalignment of the cervical spine. Electronically Signed   By: Lesia Hausen M.D.   On: 08/10/2022 15:04   DG Chest 2 View  Result Date: 08/10/2022 CLINICAL DATA:  Shortness of breath. EXAM: CHEST - 2 VIEW COMPARISON:  X-ray 07/10/2022 FINDINGS: Enlarged cardiopericardial silhouette with calcified aorta. Vascular congestion. Hyperinflation. No pneumothorax or effusion. Frontal view is rotated to the left. Lateral view is under penetrated with motion. Degenerative changes are seen of the spine. Overlapping cardiac leads. IMPRESSION: Limited x-rays. Enlarged heart with some vascular congestion. Electronically Signed   By: Karen Kays M.D.   On: 08/10/2022 14:57    Today   Subjective    Porfirio Oar today has no new complaints -Ambulating on the unit without dizziness or dyspnea on exertion, no chest pains   Patient has been seen and examined prior to discharge   Objective   Blood pressure 100/70, pulse 95, temperature 98 F (36.7 C), temperature source Oral, resp. rate (!) 21, height 5\' 6"  (1.676 m), weight 95.9 kg, SpO2 96 %.   Intake/Output Summary (Last 24 hours) at 08/28/2022 1209 Last data filed at 08/28/2022 1009 Gross per 24 hour  Intake 240 ml  Output 1100 ml  Net -860 ml    Exam Gen:- Awake Alert, no acute distress , no conversational dyspnea HEENT HEENT:- [place].AT, No sclera icterus Neck-Supple Neck,No JVD,.  Lungs-  CTAB , good air movement bilaterally CV- S1, S2 normal, gliding regular, not tachycardic  abd-  +ve B.Sounds, Abd Soft, No tenderness,    Extremity/Skin:- No  edema,   good pulses Psych-affect is appropriate, oriented x3 Neuro-generalized weakness, no new focal deficits, no tremors    Data Review   CBC w Diff:  Lab Results   Component Value Date   WBC 7.6 08/26/2022   HGB 12.4 08/26/2022   HGB 15.4 02/24/2022   HCT 38.9 08/26/2022   HCT 46.5 02/24/2022   PLT 176 08/26/2022   PLT 222 02/24/2022   LYMPHOPCT 14 08/25/2022   MONOPCT 8 08/25/2022   EOSPCT 4 08/25/2022   BASOPCT 0 08/25/2022    CMP:  Lab Results  Component Value Date   NA 139 08/28/2022   NA 144 05/28/2022   K 3.7 08/28/2022   CL 100 08/28/2022   CO2 33 (H) 08/28/2022   BUN 18 08/28/2022   BUN 8 05/28/2022   CREATININE 0.89 08/28/2022   CREATININE 0.97 09/19/2014   PROT 5.5 (L) 08/25/2022   PROT 6.1 05/28/2022   ALBUMIN 2.9 (L) 08/25/2022   ALBUMIN 4.0 05/28/2022   BILITOT 1.5 (H) 08/25/2022   BILITOT 0.5 05/28/2022   ALKPHOS 59 08/25/2022   AST 15 08/25/2022   ALT 15 08/25/2022  .  Total Discharge time is about 33 minutes  Shon Hale M.D on 08/28/2022 at 12:09 PM  Go to [email or web address] -  for contact info  [facility] - Office  [phone]

## 2022-08-28 NOTE — Discharge Instructions (Signed)
1)Very low-salt diet advised--- less than 2 g of sodium advised per day 2)Weigh yourself daily, call if you gain more than 3 pounds in 1 day or more than 5 pounds in 1 week as your diuretic medications may need to be adjusted 3)Limit your Fluid  intake to no more than 50 ounces (1.5 Liters) per day 4)You are taking Eliquis/apixaban which is a blood thinner so please Avoid ibuprofen/Advil/Aleve/Motrin/Goody Powders/Naproxen/BC powders/Meloxicam/Diclofenac/Indomethacin and other Nonsteroidal anti-inflammatory medications as these will make you more likely to bleed and can cause stomach ulcers, can also cause Kidney problems.  5)Repeat BMP and CBC in about 1 week. 6)Keep your Appointment with your cardiologist for 09/10/2022

## 2022-08-30 ENCOUNTER — Telehealth: Payer: Self-pay | Admitting: Family Medicine

## 2022-08-30 ENCOUNTER — Emergency Department (HOSPITAL_COMMUNITY): Payer: Medicare HMO

## 2022-08-30 ENCOUNTER — Telehealth: Payer: Self-pay | Admitting: *Deleted

## 2022-08-30 ENCOUNTER — Other Ambulatory Visit: Payer: Self-pay

## 2022-08-30 ENCOUNTER — Inpatient Hospital Stay (HOSPITAL_COMMUNITY)
Admission: EM | Admit: 2022-08-30 | Discharge: 2022-09-02 | DRG: 690 | Disposition: A | Discharge status: Skilled Nursing Facility | Payer: Medicare HMO | Attending: Family Medicine | Admitting: Family Medicine

## 2022-08-30 DIAGNOSIS — F339 Major depressive disorder, recurrent, unspecified: Secondary | ICD-10-CM | POA: Diagnosis not present

## 2022-08-30 DIAGNOSIS — B9689 Other specified bacterial agents as the cause of diseases classified elsewhere: Secondary | ICD-10-CM | POA: Diagnosis not present

## 2022-08-30 DIAGNOSIS — E441 Mild protein-calorie malnutrition: Secondary | ICD-10-CM | POA: Diagnosis present

## 2022-08-30 DIAGNOSIS — E559 Vitamin D deficiency, unspecified: Secondary | ICD-10-CM | POA: Diagnosis present

## 2022-08-30 DIAGNOSIS — I10 Essential (primary) hypertension: Secondary | ICD-10-CM | POA: Diagnosis present

## 2022-08-30 DIAGNOSIS — Z888 Allergy status to other drugs, medicaments and biological substances status: Secondary | ICD-10-CM

## 2022-08-30 DIAGNOSIS — Z87891 Personal history of nicotine dependence: Secondary | ICD-10-CM | POA: Diagnosis not present

## 2022-08-30 DIAGNOSIS — Z7409 Other reduced mobility: Secondary | ICD-10-CM | POA: Diagnosis present

## 2022-08-30 DIAGNOSIS — E782 Mixed hyperlipidemia: Secondary | ICD-10-CM | POA: Diagnosis present

## 2022-08-30 DIAGNOSIS — F419 Anxiety disorder, unspecified: Secondary | ICD-10-CM | POA: Diagnosis present

## 2022-08-30 DIAGNOSIS — R7303 Prediabetes: Secondary | ICD-10-CM | POA: Diagnosis present

## 2022-08-30 DIAGNOSIS — I5042 Chronic combined systolic (congestive) and diastolic (congestive) heart failure: Secondary | ICD-10-CM | POA: Diagnosis present

## 2022-08-30 DIAGNOSIS — Z7901 Long term (current) use of anticoagulants: Secondary | ICD-10-CM

## 2022-08-30 DIAGNOSIS — Y929 Unspecified place or not applicable: Secondary | ICD-10-CM

## 2022-08-30 DIAGNOSIS — Z743 Need for continuous supervision: Secondary | ICD-10-CM | POA: Diagnosis not present

## 2022-08-30 DIAGNOSIS — I482 Chronic atrial fibrillation, unspecified: Secondary | ICD-10-CM | POA: Diagnosis present

## 2022-08-30 DIAGNOSIS — N3090 Cystitis, unspecified without hematuria: Principal | ICD-10-CM | POA: Diagnosis present

## 2022-08-30 DIAGNOSIS — R42 Dizziness and giddiness: Secondary | ICD-10-CM | POA: Diagnosis not present

## 2022-08-30 DIAGNOSIS — E46 Unspecified protein-calorie malnutrition: Secondary | ICD-10-CM | POA: Insufficient documentation

## 2022-08-30 DIAGNOSIS — N390 Urinary tract infection, site not specified: Secondary | ICD-10-CM | POA: Diagnosis present

## 2022-08-30 DIAGNOSIS — I951 Orthostatic hypotension: Secondary | ICD-10-CM | POA: Diagnosis present

## 2022-08-30 DIAGNOSIS — E8809 Other disorders of plasma-protein metabolism, not elsewhere classified: Secondary | ICD-10-CM | POA: Diagnosis present

## 2022-08-30 DIAGNOSIS — I110 Hypertensive heart disease with heart failure: Secondary | ICD-10-CM | POA: Diagnosis present

## 2022-08-30 DIAGNOSIS — G20A1 Parkinson's disease without dyskinesia, without mention of fluctuations: Secondary | ICD-10-CM | POA: Diagnosis not present

## 2022-08-30 DIAGNOSIS — K219 Gastro-esophageal reflux disease without esophagitis: Secondary | ICD-10-CM | POA: Diagnosis present

## 2022-08-30 DIAGNOSIS — S199XXA Unspecified injury of neck, initial encounter: Secondary | ICD-10-CM | POA: Diagnosis not present

## 2022-08-30 DIAGNOSIS — E669 Obesity, unspecified: Secondary | ICD-10-CM | POA: Diagnosis present

## 2022-08-30 DIAGNOSIS — J449 Chronic obstructive pulmonary disease, unspecified: Secondary | ICD-10-CM | POA: Diagnosis present

## 2022-08-30 DIAGNOSIS — G2581 Restless legs syndrome: Secondary | ICD-10-CM | POA: Diagnosis present

## 2022-08-30 DIAGNOSIS — R296 Repeated falls: Secondary | ICD-10-CM | POA: Diagnosis present

## 2022-08-30 DIAGNOSIS — W19XXXA Unspecified fall, initial encounter: Secondary | ICD-10-CM | POA: Diagnosis present

## 2022-08-30 DIAGNOSIS — Z79899 Other long term (current) drug therapy: Secondary | ICD-10-CM

## 2022-08-30 DIAGNOSIS — Z8249 Family history of ischemic heart disease and other diseases of the circulatory system: Secondary | ICD-10-CM

## 2022-08-30 DIAGNOSIS — Z833 Family history of diabetes mellitus: Secondary | ICD-10-CM | POA: Diagnosis not present

## 2022-08-30 DIAGNOSIS — J9611 Chronic respiratory failure with hypoxia: Secondary | ICD-10-CM | POA: Diagnosis present

## 2022-08-30 DIAGNOSIS — R262 Difficulty in walking, not elsewhere classified: Secondary | ICD-10-CM | POA: Diagnosis not present

## 2022-08-30 DIAGNOSIS — R531 Weakness: Secondary | ICD-10-CM

## 2022-08-30 DIAGNOSIS — N3000 Acute cystitis without hematuria: Secondary | ICD-10-CM | POA: Diagnosis not present

## 2022-08-30 DIAGNOSIS — M6281 Muscle weakness (generalized): Secondary | ICD-10-CM | POA: Diagnosis not present

## 2022-08-30 DIAGNOSIS — S0990XA Unspecified injury of head, initial encounter: Secondary | ICD-10-CM | POA: Diagnosis not present

## 2022-08-30 DIAGNOSIS — Z6835 Body mass index (BMI) 35.0-35.9, adult: Secondary | ICD-10-CM

## 2022-08-30 DIAGNOSIS — Z91012 Allergy to eggs: Secondary | ICD-10-CM

## 2022-08-30 DIAGNOSIS — Z825 Family history of asthma and other chronic lower respiratory diseases: Secondary | ICD-10-CM | POA: Diagnosis not present

## 2022-08-30 DIAGNOSIS — Z882 Allergy status to sulfonamides status: Secondary | ICD-10-CM

## 2022-08-30 DIAGNOSIS — S60221A Contusion of right hand, initial encounter: Secondary | ICD-10-CM | POA: Diagnosis present

## 2022-08-30 DIAGNOSIS — S60222A Contusion of left hand, initial encounter: Secondary | ICD-10-CM | POA: Diagnosis present

## 2022-08-30 DIAGNOSIS — Z880 Allergy status to penicillin: Secondary | ICD-10-CM

## 2022-08-30 DIAGNOSIS — J90 Pleural effusion, not elsewhere classified: Secondary | ICD-10-CM | POA: Diagnosis not present

## 2022-08-30 LAB — URINALYSIS, ROUTINE W REFLEX MICROSCOPIC
Bilirubin Urine: NEGATIVE
Glucose, UA: NEGATIVE mg/dL
Ketones, ur: NEGATIVE mg/dL
Nitrite: POSITIVE — AB
Protein, ur: 30 mg/dL — AB
Specific Gravity, Urine: 1.02 (ref 1.005–1.030)
pH: 6 (ref 5.0–8.0)

## 2022-08-30 LAB — CBC
HCT: 40.7 % (ref 36.0–46.0)
Hemoglobin: 12.8 g/dL (ref 12.0–15.0)
MCH: 29.6 pg (ref 26.0–34.0)
MCHC: 31.4 g/dL (ref 30.0–36.0)
MCV: 94.2 fL (ref 80.0–100.0)
Platelets: 184 10*3/uL (ref 150–400)
RBC: 4.32 MIL/uL (ref 3.87–5.11)
RDW: 15 % (ref 11.5–15.5)
WBC: 7.8 10*3/uL (ref 4.0–10.5)
nRBC: 0 % (ref 0.0–0.2)

## 2022-08-30 LAB — COMPREHENSIVE METABOLIC PANEL
ALT: 16 U/L (ref 0–44)
AST: 19 U/L (ref 15–41)
Albumin: 3.3 g/dL — ABNORMAL LOW (ref 3.5–5.0)
Alkaline Phosphatase: 72 U/L (ref 38–126)
Anion gap: 10 (ref 5–15)
BUN: 15 mg/dL (ref 8–23)
CO2: 33 mmol/L — ABNORMAL HIGH (ref 22–32)
Calcium: 8.7 mg/dL — ABNORMAL LOW (ref 8.9–10.3)
Chloride: 96 mmol/L — ABNORMAL LOW (ref 98–111)
Creatinine, Ser: 0.89 mg/dL (ref 0.44–1.00)
GFR, Estimated: >60 mL/min (ref >60)
Glucose, Bld: 104 mg/dL — ABNORMAL HIGH (ref 70–99)
Potassium: 4.3 mmol/L (ref 3.5–5.1)
Sodium: 139 mmol/L (ref 135–145)
Total Bilirubin: 0.9 mg/dL (ref 0.3–1.2)
Total Protein: 6.3 g/dL — ABNORMAL LOW (ref 6.5–8.1)

## 2022-08-30 LAB — PROTIME-INR
INR: 1.2 (ref 0.8–1.2)
Prothrombin Time: 14.9 seconds (ref 11.4–15.2)

## 2022-08-30 LAB — MAGNESIUM: Magnesium: 1.8 mg/dL (ref 1.7–2.4)

## 2022-08-30 MED ORDER — LACTATED RINGERS IV BOLUS
500.0000 mL | Freq: Once | INTRAVENOUS | Status: AC
  Administered 2022-08-30: 500 mL via INTRAVENOUS

## 2022-08-30 MED ORDER — SODIUM CHLORIDE 0.9 % IV SOLN
1.0000 g | Freq: Once | INTRAVENOUS | Status: AC
  Administered 2022-08-30: 1 g via INTRAVENOUS
  Filled 2022-08-30: qty 10

## 2022-08-30 NOTE — H&P (Incomplete)
History and Physical    Patient: Allison Rollins OZH:[id number] DOB: 01/23/1951 DOA: 08/30/2022 DOS: the patient was seen and examined on 08/30/2022 PCP: Gabriel Earing, FNP  Patient coming from: Home  Chief Complaint:  Chief Complaint  Patient presents with  . Fall   HPI: Allison Rollins is a 72 y.o. female with medical history significant of hypertension, hyperlipidemia, GERD, RLS, CHF, atrial fibrillation on Eliquis, COPD, chronic respiratory failure on with hypoxia on supplemental oxygen [place] at baseline who presents emergency department due to complaint of multiple falls at home.  She complained of dizziness which usually occur when she stands from sitting position, this is usually followed with generalized weakness in the legs which resulted in falling to the ground, she endorsed falling yesterday and on falling today, she hit the back of her head and denies loss of consciousness, bleeding, nausea, vomiting.  Patient also complained of burning sensation with urination and increased urinary frequency which started on Saturday (5/4) prior to being discharged from this hospital. Patient was recently admitted from 4/20 to 5/4 due to atrial fibrillation with RVR.  ED Course:  In the emergency department, temperature was 97.6, respiratory rate was 20/min, pulse 87 bpm, BP 117/94, O2 sat 100%.  Workup in the ED shows normal CBC, normal BMP except for chloride of 96, bicarb 33, blood glucose 104, albumin 3.3 magnesium 1.8, urinalysis was positive for UTI. CT head without contrast showed no acute intracranial pathology CT cervical spine without contrast showed no fracture or static subluxation of the cervical spine X-ray of the pelvis showed no fracture Chest x-ray showed no acute findings Patient was started on IV ceftriaxone, IV LR 500 mL x 1 was given.  Hospitalist was asked to admit patient for further evaluation and management.  Review of Systems: Review of systems as noted in the HPI. All  other systems reviewed and are negative.   Past Medical History:  Diagnosis Date  . Anxiety   . Atrial fibrillation (HCC) 2023   Eliquis  . Chest pain 12/22/2015  . CHF (congestive heart failure) (HCC)   . COPD (chronic obstructive pulmonary disease) (HCC)   . Depression   . Headache   . HTN (hypertension)   . Hypokalemia 07/28/2013  . Kidney stones   . Osteopenia 01/22/2020  . Parkinson's disease   . 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 Pneumonia   . Pre-diabetes   . Requires continuous at home supplemental oxygen    3L [place]  . S/P colonoscopy 10/15/2004   normal  . Vitamin D deficiency 11/24/2018   Past Surgical History:  Procedure Laterality Date  . CHOLECYSTECTOMY    . COLONOSCOPY  10/15/2004   Normal rectum/Normal colon  . COLONOSCOPY N/A 05/10/2016   Procedure: COLONOSCOPY;  Surgeon: West Bali, MD;  [facility]: [facility];  Service: Endoscopy;  Laterality: N/A;  10:30  . ESOPHAGEAL MANOMETRY N/A 04/02/2019   Procedure: ESOPHAGEAL MANOMETRY (EM);  Surgeon: Napoleon Form, MD;  Location: [facility];  Service: Endoscopy;  Laterality: N/A;  . ESOPHAGOGASTRODUODENOSCOPY  09/11/2010   Dysphagia likely multifactorial (possible Candida esophagitis, likely nonspecific esophageal motility disorder, and/or uncontrolled gastroesophageal reflux disease), status post empiric dilation  . ESOPHAGOGASTRODUODENOSCOPY N/A 11/13/2018   Dr. Darrick Penna: No endoscopic esophageal abnormality to explain patient's dysphagia.  Esophagus dilated.  Moderate erosive/nodular gastritis with benign biopsies.  Marland Kitchen KIDNEY STONE SURGERY    . SAVORY DILATION N/A 11/13/2018   Procedure: SAVORY DILATION;  Surgeon: West Bali, MD;  Location: [facility];  Service: Endoscopy;  Laterality: N/A;    Social History:  reports that she quit smoking about 27 years ago. Her smoking use included cigarettes. She started smoking about 57 years ago. She has  a 7.50 pack-year smoking history. She has been exposed to tobacco smoke. She has never used smokeless tobacco. She reports that she does not drink alcohol and does not use drugs.   Allergies  Allergen Reactions  . Egg Solids, Whole   . Farxiga [Dapagliflozin] Swelling  . Penicillins Itching and Rash    Has patient had a PCN reaction causing immediate rash, facial/tongue/throat swelling, SOB or lightheadedness with hypotension: no Has patient had a PCN reaction causing severe rash involving mucus membranes or skin necrosis: no Has patient had a PCN reaction that required hospitalization: no Has patient had a PCN reaction occurring within the last 10 years: no If all of the above answers are "NO", then may proceed with Cephalosporin use.   . Sulfa Antibiotics Rash    Family History  Problem Relation Age of Onset  . Coronary artery disease Father   . COPD Mother   . Asthma Brother   . Coronary artery disease Sister   . Diabetes Sister   . Coronary artery disease Brother   . Coronary artery disease Sister     ***  Prior to Admission medications   Medication Sig Start Date End Date Taking? Authorizing Provider  acetaminophen (TYLENOL) 325 MG tablet Take 2 tablets (650 mg total) by mouth every 6 (six) hours as needed for mild pain (or Fever >/= 101). 08/28/22   Shon Hale, MD  albuterol (PROVENTIL) (2.5 MG/3ML) 0.083% nebulizer solution Take 3 mLs (2.5 mg total) by nebulization every 6 (six) hours as needed for wheezing or shortness of breath. 01/12/18   Sonny Masters, FNP  albuterol (VENTOLIN HFA) 108 (90 Base) MCG/ACT inhaler Inhale 2 puffs into the lungs every 6 (six) hours as needed for wheezing or shortness of breath. 08/18/20   Gabriel Earing, FNP  alum & mag hydroxide-simeth (MAALOX/MYLANTA) 200-200-20 MG/5ML suspension Take 30 mLs by mouth every 6 (six) hours as needed for indigestion or heartburn. 07/19/22   Shahmehdi, Gemma Payor, MD  amiodarone (PACERONE) 200 MG tablet Take 1  tablet (200 mg total) by mouth daily. Take amiodarone  200mg  twice daily for  3 weeks (thru 09/18/22),  then 200mg  once daily starting Monday 09/19/22 08/28/22 08/28/23  Shon Hale, MD  apixaban (ELIQUIS) 5 MG TABS tablet Take 1 tablet (5 mg total) by mouth 2 (two) times daily. 08/28/22 08/28/23  Shon Hale, MD  Ascorbic Acid (VITAMIN C PO) Take 1 tablet by mouth 3 (three) times daily.    [provider]  atorvastatin (LIPITOR) 40 MG tablet Take 1 tablet (40 mg total) by mouth daily. 08/28/22   Shon Hale, MD  CALCIUM PO Take 1 tablet by mouth daily.    [provider]  cholecalciferol (VITAMIN D3) 25 MCG (1000 UT) tablet Take 1,000 Units by mouth daily.    [provider]  famotidine (PEPCID) 20 MG tablet Take 20 mg by mouth daily. 07/29/22   [provider]  fluticasone Aleda Grana) 50 MCG/ACT nasal spray Use 2 sprays into each nostril every day Patient not taking: Reported on 08/25/2022 05/09/22   Gabriel Earing, FNP  furosemide (LASIX) 20 MG tablet Take 1 tablet (20 mg total) by mouth daily. 08/28/22   Shon Hale, MD  gabapentin (  NEURONTIN) 300 MG capsule Take 300 mg by mouth See admin instructions. Take 600 mg by mouth three times daily and at bedtime. 07/29/22   [provider]  glucose blood (ACCU-CHEK AVIVA PLUS) test strip Test sugars daily 02/29/20   Dettinger, Elige Radon, MD  metoprolol tartrate (LOPRESSOR) 25 MG tablet Take 0.5 tablets (12.5 mg total) by mouth 2 (two) times daily. 08/28/22 09/27/22  Shon Hale, MD  midodrine (PROAMATINE) 10 MG tablet Take 1 tablet (10 mg total) by mouth 3 (three) times daily with meals. For Low Blood Pressure 08/28/22 08/28/23  Shon Hale, MD  nitroGLYCERIN (NITROSTAT) 0.4 MG SL tablet Place 1 tablet (0.4 mg total) under the tongue every 5 (five) minutes as needed for chest pain. 12/25/15   [name], MD  pantoprazole (PROTONIX) 40 MG tablet Take 1 tablet (40 mg total) by mouth 2 (two) times daily.  08/28/22   Shon Hale, MD  pramipexole (MIRAPEX) 0.5 MG tablet Take 1 tablet (0.5 mg total) by mouth at bedtime. 08/28/22   Shon Hale, MD  sertraline (ZOLOFT) 100 MG tablet Take 2 tablets (200 mg total) by mouth daily. 08/28/22   Shon Hale, MD    Physical Exam: BP 102/72   Pulse 87   Temp 97.6 F (36.4 C) (Oral)   Resp 20   Ht 5\' 6"  (1.676 m)   Wt 95.9 kg   SpO2 94%   BMI 34.12 kg/m   General: 72 y.o. year-old female well developed well nourished in no acute distress.  Alert and oriented x3. HEENT: NCAT, EOMI Neck: Supple, trachea medial Cardiovascular: Irregularly irregular rate and rhythm with no rubs or gallops.  No thyromegaly or JVD noted.  +2 lower extremity edema bilaterally. 2/4 pulses in all 4 extremities. Respiratory: Bilateral Rales in lower lobes on auscultation with no wheezes.   Abdomen: Soft, nontender nondistended with normal bowel sounds x4 quadrants. Muskuloskeletal: No cyanosis, clubbing noted bilaterally Neuro: CN II-XII intact, strength 5/5 x 4, sensation, reflexes intact Skin: No ulcerative lesions noted or rashes Psychiatry: Judgement and insight appear normal. Mood is appropriate for condition and setting          Labs on Admission:  Basic Metabolic Panel: Recent Labs  Lab 08/25/22 0515 08/26/22 0522 08/26/22 0546 08/27/22 0416 08/28/22 0420 08/30/22 1811  NA 140 139  --  138 139 139  K 3.1* 3.7  --  3.5 3.7 4.3  CL 99 98  --  99 100 96*  CO2 30 32  --  31 33* 33*  GLUCOSE 85 132*  --  123* 119* 104*  BUN 13 17  --  19 18 15   CREATININE 1.08* 1.24*  --  0.96 0.89 0.89  CALCIUM 8.4* 8.4*  --  8.4* 8.6* 8.7*  MG 1.6*  --  2.0 1.9  --  1.8   Liver Function Tests: Recent Labs  Lab 08/24/22 2208 08/25/22 0515 08/30/22 1811  AST 20 15 19   ALT 16 15 16   ALKPHOS 71 59 72  BILITOT 1.6* 1.5* 0.9  PROT 6.5 5.5* 6.3*  ALBUMIN 3.3* 2.9* 3.3*   No results for input(s): "LIPASE", "AMYLASE" in the last 168 hours. No results for  input(s): "AMMONIA" in the last 168 hours. CBC: Recent Labs  Lab 08/24/22 2208 08/25/22 0515 08/26/22 0522 08/30/22 1811  WBC 8.0 7.3 7.6 7.8  NEUTROABS 6.2 5.3  --   --   HGB 13.1 11.9* 12.4 12.8  HCT 40.4 37.1 38.9 40.7  MCV 92.2 92.3  93.5 94.2  PLT 198 168 176 184   Cardiac Enzymes: No results for input(s): "CKTOTAL", "CKMB", "CKMBINDEX", "TROPONINI" in the last 168 hours.  BNP (last 3 results) Recent Labs    07/10/22 1550 08/10/22 1359 08/24/22 2208  BNP 767.0* 783.0* 1,029.0*    ProBNP (last 3 results) No results for input(s): "PROBNP" in the last 8760 hours.  CBG: Recent Labs  Lab 08/28/22 0742 08/28/22 1139  GLUCAP 95 115*    Radiological Exams on Admission: CT HEAD WO CONTRAST  Result Date: 08/30/2022 CLINICAL DATA:  Multiple falls, head injury EXAM: CT HEAD WITHOUT CONTRAST CT CERVICAL SPINE WITHOUT CONTRAST TECHNIQUE: Multidetector CT imaging of the head and cervical spine was performed following the standard protocol without intravenous contrast. Multiplanar CT image reconstructions of the cervical spine were also generated. RADIATION DOSE REDUCTION: This exam was performed according to the departmental dose-optimization program which includes automated exposure control, adjustment of the mA and/or kV according to patient size and/or use of iterative reconstruction technique. COMPARISON:  08/10/2022 FINDINGS: CT HEAD FINDINGS Brain: No evidence of acute infarction, hemorrhage, hydrocephalus, extra-axial collection or mass lesion/mass effect. Periventricular and deep white matter hypodensity. Vascular: No hyperdense vessel or unexpected calcification. Skull: Normal. Negative for fracture or focal lesion. Sinuses/Orbits: No acute finding. Other: None. CT CERVICAL SPINE FINDINGS Alignment: Normal. Skull base and vertebrae: No acute fracture. No primary bone lesion or focal pathologic process. Soft tissues and spinal canal: No prevertebral fluid or swelling. No visible  canal hematoma. Disc levels: Mild multilevel disc space height loss and osteophytosis. Upper chest: Negative. Other: None. IMPRESSION: 1. No acute intracranial pathology. Small-vessel white matter disease. 2. No fracture or static subluxation of the cervical spine. Electronically Signed   By: Jearld Lesch M.D.   On: 08/30/2022 18:57   CT CERVICAL SPINE WO CONTRAST  Result Date: 08/30/2022 CLINICAL DATA:  Multiple falls, head injury EXAM: CT HEAD WITHOUT CONTRAST CT CERVICAL SPINE WITHOUT CONTRAST TECHNIQUE: Multidetector CT imaging of the head and cervical spine was performed following the standard protocol without intravenous contrast. Multiplanar CT image reconstructions of the cervical spine were also generated. RADIATION DOSE REDUCTION: This exam was performed according to the departmental dose-optimization program which includes automated exposure control, adjustment of the mA and/or kV according to patient size and/or use of iterative reconstruction technique. COMPARISON:  08/10/2022 FINDINGS: CT HEAD FINDINGS Brain: No evidence of acute infarction, hemorrhage, hydrocephalus, extra-axial collection or mass lesion/mass effect. Periventricular and deep white matter hypodensity. Vascular: No hyperdense vessel or unexpected calcification. Skull: Normal. Negative for fracture or focal lesion. Sinuses/Orbits: No acute finding. Other: None. CT CERVICAL SPINE FINDINGS Alignment: Normal. Skull base and vertebrae: No acute fracture. No primary bone lesion or focal pathologic process. Soft tissues and spinal canal: No prevertebral fluid or swelling. No visible canal hematoma. Disc levels: Mild multilevel disc space height loss and osteophytosis. Upper chest: Negative. Other: None. IMPRESSION: 1. No acute intracranial pathology. Small-vessel white matter disease. 2. No fracture or static subluxation of the cervical spine. Electronically Signed   By: Jearld Lesch M.D.   On: 08/30/2022 18:57   DG Chest 1  View  Result Date: 08/30/2022 CLINICAL DATA:  Three falls in the last day. EXAM: CHEST  1 VIEW COMPARISON:  Radiograph 08/24/2022 FINDINGS: Cardiomegaly is stable. Unchanged mediastinal contours. Suspected left pleural effusion and basilar opacity, without significant interval change. There is likely a diminutive right pleural effusion. Slight improvement in vascular congestion. No new airspace disease. No acute osseous abnormality on this  portable exam. IMPRESSION: 1. No acute findings. 2. Unchanged cardiomegaly and suspected pleural effusions, left greater than right. Electronically Signed   By: Narda Rutherford M.D.   On: 08/30/2022 18:52   DG Pelvis 1-2 Views  Result Date: 08/30/2022 CLINICAL DATA:  Three falls in the past day. EXAM: PELVIS - 1-2 VIEW COMPARISON:  Pelvis radiograph in right hip CT 09/05/2021 FINDINGS: The cortical margins of the bony pelvis are intact. No fracture. Pubic symphysis and sacroiliac joints are congruent. Both femoral heads are well-seated in the respective acetabula. Bilateral hip osteoarthritis. IMPRESSION: No fracture of the pelvis. Electronically Signed   By: Narda Rutherford M.D.   On: 08/30/2022 18:50    EKG: I independently viewed the EKG done and my findings are as followed: Atrial fibrillation with RVR  Assessment/Plan Present on Admission: . UTI (urinary tract infection)  Principal Problem:   UTI (urinary tract infection)   UTI POA Patient complaining of dysuria and increased urinary frequency. Urinalysis was positive for UTI Patient was started on IV ceftriaxone, we shall continue with same at this time. Urine culture pending  Recurrent falls at home Hypoalbuminemia secondary to mild protein calorie malnutrition A-fib with RVR Acute on chronic combined systolic and diastolic CHF Essential hypertension Chronic respiratory failure with hypoxia  DVT prophylaxis: ***   Code Status: ***   Family Communication: ***   Disposition Plan: ***    Consults called: ***   Admission status: ***     Frankey Shown MD [facility] Pager [phone]  If 7PM-7AM, please contact night-coverage [email or web address] Password [facility]  08/30/2022, 11:51 PM     Review of Systems: {ROS_Text:26778} Past Medical History:  Diagnosis Date  . Anxiety   . Atrial fibrillation (HCC) 2023   Eliquis  . Chest pain 12/22/2015  . CHF (congestive heart failure) (HCC)   . COPD (chronic obstructive pulmonary disease) (HCC)   . Depression   . Headache   . HTN (hypertension)   . Hypokalemia 07/28/2013  . Kidney stones   . Osteopenia 01/22/2020  . Parkinson's disease   . 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 Pneumonia   . Pre-diabetes   . Requires continuous at home supplemental oxygen    3L [place]  . S/P colonoscopy 10/15/2004   normal  . Vitamin D deficiency 11/24/2018   Past Surgical History:  Procedure Laterality Date  . CHOLECYSTECTOMY    . COLONOSCOPY  10/15/2004   Normal rectum/Normal colon  . COLONOSCOPY N/A 05/10/2016   Procedure: COLONOSCOPY;  Surgeon: West Bali, MD;  [facility];  Service: Endoscopy;  Laterality: N/A;  10:30  . ESOPHAGEAL MANOMETRY N/A 04/02/2019   Procedure: ESOPHAGEAL MANOMETRY (EM);  Surgeon: Napoleon Form, MD;  [facility];  Service: Endoscopy;  Laterality: N/A;  . ESOPHAGOGASTRODUODENOSCOPY  09/11/2010   Dysphagia likely multifactorial (possible Candida esophagitis, likely nonspecific esophageal motility disorder, and/or uncontrolled gastroesophageal reflux disease), status post empiric dilation  . ESOPHAGOGASTRODUODENOSCOPY N/A 11/13/2018   Dr. Darrick Penna: No endoscopic esophageal abnormality to explain patient's dysphagia.  Esophagus dilated.  Moderate erosive/nodular gastritis with benign biopsies.  Marland Kitchen KIDNEY STONE SURGERY    . SAVORY DILATION N/A 11/13/2018   Procedure: SAVORY DILATION;  Surgeon: West Bali, MD;   [facility];  Service: Endoscopy;  Laterality: N/A;   Social History:  reports that she quit smoking about 27 years ago. Her smoking use included cigarettes. She started smoking about 57 years ago. She has a  7.50 pack-year smoking history. She has been exposed to tobacco smoke. She has never used smokeless tobacco. She reports that she does not drink alcohol and does not use drugs.  Allergies  Allergen Reactions  . Egg Solids, Whole   . Farxiga [Dapagliflozin] Swelling  . Penicillins Itching and Rash    Has patient had a PCN reaction causing immediate rash, facial/tongue/throat swelling, SOB or lightheadedness with hypotension: no Has patient had a PCN reaction causing severe rash involving mucus membranes or skin necrosis: no Has patient had a PCN reaction that required hospitalization: no Has patient had a PCN reaction occurring within the last 10 years: no If all of the above answers are "NO", then may proceed with Cephalosporin use.   . Sulfa Antibiotics Rash    Family History  Problem Relation Age of Onset  . Coronary artery disease Father   . COPD Mother   . Asthma Brother   . Coronary artery disease Sister   . Diabetes Sister   . Coronary artery disease Brother   . Coronary artery disease Sister     Prior to Admission medications   Medication Sig Start Date End Date Taking? Authorizing Provider  acetaminophen (TYLENOL) 325 MG tablet Take 2 tablets (650 mg total) by mouth every 6 (six) hours as needed for mild pain (or Fever >/= 101). 08/28/22   Shon Hale, MD  albuterol (PROVENTIL) (2.5 MG/3ML) 0.083% nebulizer solution Take 3 mLs (2.5 mg total) by nebulization every 6 (six) hours as needed for wheezing or shortness of breath. 01/12/18   Sonny Masters, FNP  albuterol (VENTOLIN HFA) 108 (90 Base) MCG/ACT inhaler Inhale 2 puffs into the lungs every 6 (six) hours as needed for wheezing or shortness of breath. 08/18/20   Gabriel Earing, FNP  alum & mag  hydroxide-simeth (MAALOX/MYLANTA) 200-200-20 MG/5ML suspension Take 30 mLs by mouth every 6 (six) hours as needed for indigestion or heartburn. 07/19/22   Shahmehdi, Gemma Payor, MD  amiodarone (PACERONE) 200 MG tablet Take 1 tablet (200 mg total) by mouth daily. Take amiodarone  200mg  twice daily for  3 weeks (thru 09/18/22),  then 200mg  once daily starting Monday 09/19/22 08/28/22 08/28/23  Shon Hale, MD  apixaban (ELIQUIS) 5 MG TABS tablet Take 1 tablet (5 mg total) by mouth 2 (two) times daily. 08/28/22 08/28/23  Shon Hale, MD  Ascorbic Acid (VITAMIN C PO) Take 1 tablet by mouth 3 (three) times daily.    [provider]  atorvastatin (LIPITOR) 40 MG tablet Take 1 tablet (40 mg total) by mouth daily. 08/28/22   Shon Hale, MD  CALCIUM PO Take 1 tablet by mouth daily.    [provider]  cholecalciferol (VITAMIN D3) 25 MCG (1000 UT) tablet Take 1,000 Units by mouth daily.    [provider]  famotidine (PEPCID) 20 MG tablet Take 20 mg by mouth daily. 07/29/22   [provider]  fluticasone Aleda Grana) 50 MCG/ACT nasal spray Use 2 sprays into each nostril every day Patient not taking: Reported on 08/25/2022 05/09/22   Gabriel Earing, FNP  furosemide (LASIX) 20 MG tablet Take 1 tablet (20 mg total) by mouth daily. 08/28/22   Shon Hale, MD  gabapentin (NEURONTIN) 300 MG capsule Take 300 mg by mouth See admin instructions. Take 600 mg by mouth three times daily and at bedtime. 07/29/22   [provider]  glucose blood (ACCU-CHEK AVIVA PLUS) test strip Test sugars daily 02/29/20   Dettinger, Elige Radon, MD  metoprolol tartrate (  LOPRESSOR) 25 MG tablet Take 0.5 tablets (12.5 mg total) by mouth 2 (two) times daily. 08/28/22 09/27/22  Shon Hale, MD  midodrine (PROAMATINE) 10 MG tablet Take 1 tablet (10 mg total) by mouth 3 (three) times daily with meals. For Low Blood Pressure 08/28/22 08/28/23  Shon Hale, MD  nitroGLYCERIN (NITROSTAT) 0.4 MG SL tablet Place  1 tablet (0.4 mg total) under the tongue every 5 (five) minutes as needed for chest pain. 12/25/15   [name], MD  pantoprazole (PROTONIX) 40 MG tablet Take 1 tablet (40 mg total) by mouth 2 (two) times daily. 08/28/22   Shon Hale, MD  pramipexole (MIRAPEX) 0.5 MG tablet Take 1 tablet (0.5 mg total) by mouth at bedtime. 08/28/22   Shon Hale, MD  sertraline (ZOLOFT) 100 MG tablet Take 2 tablets (200 mg total) by mouth daily. 08/28/22   Shon Hale, MD    Physical Exam: Vitals:   08/30/22 2104 08/30/22 2130 08/30/22 2200 08/30/22 2230  BP: 103/74 104/68 101/80 102/72  Pulse:      Resp: 20     Temp:      TempSrc:      SpO2: 94%     Weight:      Height:       *** Data Reviewed: {Tip this will not be part of the note when signed- Document your independent interpretation of telemetry tracing, EKG, lab, Radiology test or any other diagnostic tests. Add any new diagnostic test ordered today. (Optional):26781} {Results:26384}  Assessment and Plan: No notes have been filed under this hospital service. Service: Hospitalist     Advance Care Planning:   Code Status: Prior ***  Consults: ***  Family Communication: ***  Severity of Illness: {Observation/Inpatient:21159}  Author: Frankey Shown, DO 08/30/2022 11:40 PM  For on call review [email or web address].

## 2022-08-30 NOTE — ED Triage Notes (Signed)
Pt arrived via [facility] from home c/o 3 falls within the last 24 hours, hit her head, does take blood thinners for a fib, on 3 L [place] at baseline, denies LOC, A&Ox 4, possible UTI

## 2022-08-30 NOTE — ED Notes (Signed)
Pt's son states that pt has fallen three times within the past 24 hours and expresses concern with her living at home, pt sons states he would like for her to go to rehab for falls if that is a possibility

## 2022-08-30 NOTE — Telephone Encounter (Signed)
Allison Rollins was discharged  08/28/22   after a hospital inpatient stay at [facility]. A Transition of Care Call was attempted today outreach #1 unsuccessful with no answer to telephone.  Please see Transitions of Care (Post Inpatient/ED Visit) note for details.   Irving Shows RNC, BSN RN Case Manager [facility] [phone]

## 2022-08-30 NOTE — ED Provider Notes (Signed)
[place] EMERGENCY DEPARTMENT AT [facility] Provider Note   CSN: [id number] Arrival date & time: 08/30/22  1640     History  Chief Complaint  Patient presents with   Allison Rollins is a 72 y.o. female.   Fall  Patient presents for multiple falls at home.  Medical history includes atrial fibrillation, osteopenia, anxiety, CHF, COPD, parkinsonian syndrome, chronic hypoxia.  She is on 3 L of oxygen at baseline.  She is on Eliquis for her atrial fibrillation.  Patient reports mechanical falls yesterday and today.  She describes these falls as follows: While walking, she will feel generalized weakness in her legs.  At this point, her legs give out and she falls to the ground.  She has had some bruising to her hands as well as swelling on the back of her head.  She denies any other areas of pain or suspected injury.  She did not take her Eliquis dose today.  She feels that her intermittent leg weakness is likely secondary to UTI.  She does report some recent dysuria.    Home Medications Prior to Admission medications   Medication Sig Start Date End Date Taking? Authorizing Provider  acetaminophen (TYLENOL) 325 MG tablet Take 2 tablets (650 mg total) by mouth every 6 (six) hours as needed for mild pain (or Fever >/= 101). 08/28/22   Shon Hale, MD  albuterol (PROVENTIL) (2.5 MG/3ML) 0.083% nebulizer solution Take 3 mLs (2.5 mg total) by nebulization every 6 (six) hours as needed for wheezing or shortness of breath. 01/12/18   Sonny Masters, FNP  albuterol (VENTOLIN HFA) 108 (90 Base) MCG/ACT inhaler Inhale 2 puffs into the lungs every 6 (six) hours as needed for wheezing or shortness of breath. 08/18/20   Gabriel Earing, FNP  alum & mag hydroxide-simeth (MAALOX/MYLANTA) 200-200-20 MG/5ML suspension Take 30 mLs by mouth every 6 (six) hours as needed for indigestion or heartburn. 07/19/22   Shahmehdi, Gemma Payor, MD  amiodarone (PACERONE) 200 MG tablet Take 1 tablet (200 mg  total) by mouth daily. Take amiodarone  200mg  twice daily for  3 weeks (thru 09/18/22),  then 200mg  once daily starting Monday 09/19/22 08/28/22 08/28/23  Shon Hale, MD  apixaban (ELIQUIS) 5 MG TABS tablet Take 1 tablet (5 mg total) by mouth 2 (two) times daily. 08/28/22 08/28/23  Shon Hale, MD  Ascorbic Acid (VITAMIN C PO) Take 1 tablet by mouth 3 (three) times daily.    [provider]  atorvastatin (LIPITOR) 40 MG tablet Take 1 tablet (40 mg total) by mouth daily. 08/28/22   Shon Hale, MD  CALCIUM PO Take 1 tablet by mouth daily.    [provider]  cholecalciferol (VITAMIN D3) 25 MCG (1000 UT) tablet Take 1,000 Units by mouth daily.    [provider]  famotidine (PEPCID) 20 MG tablet Take 20 mg by mouth daily. 07/29/22   [provider]  fluticasone Aleda Grana) 50 MCG/ACT nasal spray Use 2 sprays into each nostril every day Patient not taking: Reported on 08/25/2022 05/09/22   Gabriel Earing, FNP  furosemide (LASIX) 20 MG tablet Take 1 tablet (20 mg total) by mouth daily. 08/28/22   Shon Hale, MD  gabapentin (NEURONTIN) 300 MG capsule Take 300 mg by mouth See admin instructions. Take 600 mg by mouth three times daily and at bedtime. 07/29/22   [provider]  glucose blood (ACCU-CHEK AVIVA PLUS) test strip Test sugars daily 02/29/20   Dettinger,  Elige Radon, MD  metoprolol tartrate (LOPRESSOR) 25 MG tablet Take 0.5 tablets (12.5 mg total) by mouth 2 (two) times daily. 08/28/22 09/27/22  Shon Hale, MD  midodrine (PROAMATINE) 10 MG tablet Take 1 tablet (10 mg total) by mouth 3 (three) times daily with meals. For Low Blood Pressure 08/28/22 08/28/23  Shon Hale, MD  nitroGLYCERIN (NITROSTAT) 0.4 MG SL tablet Place 1 tablet (0.4 mg total) under the tongue every 5 (five) minutes as needed for chest pain. 12/25/15   [name], MD  pantoprazole (PROTONIX) 40 MG tablet Take 1 tablet (40 mg total) by mouth 2 (two) times daily. 08/28/22   Shon Hale, MD  pramipexole (MIRAPEX) 0.5 MG tablet Take 1 tablet (0.5 mg total) by mouth at bedtime. 08/28/22   Shon Hale, MD  sertraline (ZOLOFT) 100 MG tablet Take 2 tablets (200 mg total) by mouth daily. 08/28/22   Shon Hale, MD      Allergies    Egg solids, whole; Farxiga [dapagliflozin]; Penicillins; and Sulfa antibiotics    Review of Systems   Review of Systems  Genitourinary:  Positive for dysuria.  Neurological:  Positive for weakness.  All other systems reviewed and are negative.   Physical Exam Updated Vital Signs BP 100/80   Pulse 98   Temp 98.4 F (36.9 C)   Resp 18   Ht 5\' 6"  (1.676 m)   Wt 95.9 kg   SpO2 100%   BMI 34.12 kg/m  Physical Exam Vitals and nursing note reviewed.  Constitutional:      General: She is not in acute distress.    Appearance: Normal appearance. She is well-developed. She is not ill-appearing, toxic-appearing or diaphoretic.  HENT:     Head: Normocephalic.     Comments: Occipital hematoma.  Skin is intact.    Right Ear: External ear normal.     Left Ear: External ear normal.     Nose: Nose normal.     Mouth/Throat:     Mouth: Mucous membranes are moist.  Eyes:     Extraocular Movements: Extraocular movements intact.     Conjunctiva/sclera: Conjunctivae normal.  Cardiovascular:     Rate and Rhythm: Normal rate.     Heart sounds: No murmur heard. Pulmonary:     Effort: Pulmonary effort is normal. No respiratory distress.     Breath sounds: Normal breath sounds. No wheezing or rales.  Chest:     Chest wall: No tenderness.  Abdominal:     General: There is no distension.     Palpations: Abdomen is soft.     Tenderness: There is no abdominal tenderness.  Musculoskeletal:        General: No swelling. Normal range of motion.     Cervical back: Normal range of motion and neck supple.     Right lower leg: No edema.     Left lower leg: No edema.  Skin:    General: Skin is warm and dry.     Coloration: Skin is not  jaundiced or pale.     Findings: Bruising present.  Neurological:     General: No focal deficit present.     Mental Status: She is alert and oriented to person, place, and time.     Cranial Nerves: No cranial nerve deficit.     Sensory: No sensory deficit.     Motor: No weakness.     Coordination: Coordination normal.  Psychiatric:        Mood and Affect: Mood normal.  Behavior: Behavior normal.        Thought Content: Thought content normal.        Judgment: Judgment normal.     ED Results / Procedures / Treatments   Labs (all labs ordered are listed, but only abnormal results are displayed) Labs Reviewed  COMPREHENSIVE METABOLIC PANEL - Abnormal; Notable for the following components:      Result Value   Chloride 96 (*)    CO2 33 (*)    Glucose, Bld 104 (*)    Calcium 8.7 (*)    Total Protein 6.3 (*)    Albumin 3.3 (*)    All other components within normal limits  URINALYSIS, ROUTINE W REFLEX MICROSCOPIC - Abnormal; Notable for the following components:   APPearance HAZY (*)    Hgb urine dipstick SMALL (*)    Protein, ur 30 (*)    Nitrite POSITIVE (*)    Leukocytes,Ua MODERATE (*)    Bacteria, UA MANY (*)    All other components within normal limits  URINE CULTURE  CBC  PROTIME-INR  MAGNESIUM  COMPREHENSIVE METABOLIC PANEL  CBC  MAGNESIUM  PHOSPHORUS    EKG EKG Interpretation  Date/Time:  Monday Aug 30 2022 17:29:44 EDT Ventricular Rate:  107 PR Interval:    QRS Duration: 92 QT Interval:  358 QTC Calculation: 478 R Axis:   118 Text Interpretation: Atrial fibrillation Multiple ventricular premature complexes Anterior infarct, old Borderline repolarization abnormality Confirmed by Gloris Manchester [phone]) on 08/30/2022 9:52:51 PM  Radiology CT HEAD WO CONTRAST  Result Date: 08/30/2022 CLINICAL DATA:  Multiple falls, head injury EXAM: CT HEAD WITHOUT CONTRAST CT CERVICAL SPINE WITHOUT CONTRAST TECHNIQUE: Multidetector CT imaging of the head and cervical spine  was performed following the standard protocol without intravenous contrast. Multiplanar CT image reconstructions of the cervical spine were also generated. RADIATION DOSE REDUCTION: This exam was performed according to the departmental dose-optimization program which includes automated exposure control, adjustment of the mA and/or kV according to patient size and/or use of iterative reconstruction technique. COMPARISON:  08/10/2022 FINDINGS: CT HEAD FINDINGS Brain: No evidence of acute infarction, hemorrhage, hydrocephalus, extra-axial collection or mass lesion/mass effect. Periventricular and deep white matter hypodensity. Vascular: No hyperdense vessel or unexpected calcification. Skull: Normal. Negative for fracture or focal lesion. Sinuses/Orbits: No acute finding. Other: None. CT CERVICAL SPINE FINDINGS Alignment: Normal. Skull base and vertebrae: No acute fracture. No primary bone lesion or focal pathologic process. Soft tissues and spinal canal: No prevertebral fluid or swelling. No visible canal hematoma. Disc levels: Mild multilevel disc space height loss and osteophytosis. Upper chest: Negative. Other: None. IMPRESSION: 1. No acute intracranial pathology. Small-vessel white matter disease. 2. No fracture or static subluxation of the cervical spine. Electronically Signed   By: Jearld Lesch M.D.   On: 08/30/2022 18:57   CT CERVICAL SPINE WO CONTRAST  Result Date: 08/30/2022 CLINICAL DATA:  Multiple falls, head injury EXAM: CT HEAD WITHOUT CONTRAST CT CERVICAL SPINE WITHOUT CONTRAST TECHNIQUE: Multidetector CT imaging of the head and cervical spine was performed following the standard protocol without intravenous contrast. Multiplanar CT image reconstructions of the cervical spine were also generated. RADIATION DOSE REDUCTION: This exam was performed according to the departmental dose-optimization program which includes automated exposure control, adjustment of the mA and/or kV according to patient size  and/or use of iterative reconstruction technique. COMPARISON:  08/10/2022 FINDINGS: CT HEAD FINDINGS Brain: No evidence of acute infarction, hemorrhage, hydrocephalus, extra-axial collection or mass lesion/mass effect. Periventricular and deep white matter  hypodensity. Vascular: No hyperdense vessel or unexpected calcification. Skull: Normal. Negative for fracture or focal lesion. Sinuses/Orbits: No acute finding. Other: None. CT CERVICAL SPINE FINDINGS Alignment: Normal. Skull base and vertebrae: No acute fracture. No primary bone lesion or focal pathologic process. Soft tissues and spinal canal: No prevertebral fluid or swelling. No visible canal hematoma. Disc levels: Mild multilevel disc space height loss and osteophytosis. Upper chest: Negative. Other: None. IMPRESSION: 1. No acute intracranial pathology. Small-vessel white matter disease. 2. No fracture or static subluxation of the cervical spine. Electronically Signed   By: Jearld Lesch M.D.   On: 08/30/2022 18:57   DG Chest 1 View  Result Date: 08/30/2022 CLINICAL DATA:  Three falls in the last day. EXAM: CHEST  1 VIEW COMPARISON:  Radiograph 08/24/2022 FINDINGS: Cardiomegaly is stable. Unchanged mediastinal contours. Suspected left pleural effusion and basilar opacity, without significant interval change. There is likely a diminutive right pleural effusion. Slight improvement in vascular congestion. No new airspace disease. No acute osseous abnormality on this portable exam. IMPRESSION: 1. No acute findings. 2. Unchanged cardiomegaly and suspected pleural effusions, left greater than right. Electronically Signed   By: Narda Rutherford M.D.   On: 08/30/2022 18:52   DG Pelvis 1-2 Views  Result Date: 08/30/2022 CLINICAL DATA:  Three falls in the past day. EXAM: PELVIS - 1-2 VIEW COMPARISON:  Pelvis radiograph in right hip CT 09/05/2021 FINDINGS: The cortical margins of the bony pelvis are intact. No fracture. Pubic symphysis and sacroiliac joints are  congruent. Both femoral heads are well-seated in the respective acetabula. Bilateral hip osteoarthritis. IMPRESSION: No fracture of the pelvis. Electronically Signed   By: Narda Rutherford M.D.   On: 08/30/2022 18:50    Procedures Procedures    Medications Ordered in ED Medications  cefTRIAXone (ROCEPHIN) 1 g in sodium chloride 0.9 % 100 mL IVPB (has no administration in time range)  feeding supplement (ENSURE ENLIVE / ENSURE PLUS) liquid 237 mL (has no administration in time range)  acetaminophen (TYLENOL) tablet 650 mg (has no administration in time range)    Or  acetaminophen (TYLENOL) suppository 650 mg (has no administration in time range)  ondansetron (ZOFRAN) tablet 4 mg (has no administration in time range)    Or  ondansetron (ZOFRAN) injection 4 mg (has no administration in time range)  amiodarone (PACERONE) tablet 200 mg (200 mg Oral Given 08/31/22 0142)  atorvastatin (LIPITOR) tablet 40 mg (has no administration in time range)  midodrine (PROAMATINE) tablet 10 mg (has no administration in time range)  sertraline (ZOLOFT) tablet 200 mg (has no administration in time range)  apixaban (ELIQUIS) tablet 5 mg (5 mg Oral Given 08/31/22 0142)  pantoprazole (PROTONIX) EC tablet 40 mg (40 mg Oral Given 08/31/22 0142)  famotidine (PEPCID) tablet 20 mg (has no administration in time range)  pramipexole (MIRAPEX) tablet 0.5 mg (0.5 mg Oral Given 08/31/22 0141)  albuterol (PROVENTIL) (2.5 MG/3ML) 0.083% nebulizer solution 2.5 mg (has no administration in time range)  0.9 %  sodium chloride infusion ( Intravenous New Bag/Given 08/31/22 0110)  lactated ringers bolus 500 mL (500 mLs Intravenous Bolus 08/30/22 1806)  cefTRIAXone (ROCEPHIN) 1 g in sodium chloride 0.9 % 100 mL IVPB (0 g Intravenous Stopped 08/31/22 0005)    ED Course/ Medical Decision Making/ A&P                             Medical Decision Making Amount and/or Complexity of Data Reviewed  Labs: ordered. Radiology:  ordered.  Risk Decision regarding hospitalization.   This patient presents to the ED for concern of generalized weakness and fall, this involves an extensive number of treatment options, and is a complaint that carries with it a high risk of complications and morbidity.  The differential diagnosis includes infection, dehydration, deconditioning, neuromuscular disorder, acute injuries   Co morbidities that complicate the patient evaluation  atrial fibrillation, osteopenia, anxiety, CHF, COPD, parkinsonian syndrome, chronic hypoxia   Additional history obtained:  Additional history obtained from N/A External records from outside source obtained and reviewed including EMR   Lab Tests:  I Ordered, and personally interpreted labs.  The pertinent results include: Normal kidney function, normal electrolytes, normal hemoglobin, no leukocytosis, UA consistent with UTI   Imaging Studies ordered:  I ordered imaging studies including x-ray of chest and pelvis, CT head and cervical spine I independently visualized and interpreted imaging which showed no acute findings I agree with the radiologist interpretation   Cardiac Monitoring: / EKG:  The patient was maintained on a cardiac monitor.  I personally viewed and interpreted the cardiac monitored which showed an underlying rhythm of: Atrial fibrillation   Problem List / ED Course / Critical interventions / Medication management  Patient presents for generalized weakness over the past several days resulting in several falls.  She describes the falls as mechanical in nature and caused by bilateral leg weakness.  She has impaired mobility at baseline given her Parkinson's.  During her fall today, she did strike the back of her head.  She is on Eliquis.  Patient is alert and oriented on arrival.  She does have a hematoma with skin intact and area of occiput.  She has no focal neurologic deficits.  Diagnostic workup was initiated.  Fortunately,  patient did not have any evidence of acute injuries on imaging studies.  Urinalysis does confirm UTI.  Ceftriaxone was ordered.  Patient was given IV fluids for hydration.  Following this, she continued to feel weak.  Currently, she does live alone.  Given her continued weakness, patient was admitted for further management. I ordered medication including IV fluids for hydration; ceftriaxone for UTI Reevaluation of the patient after these medicines showed that the patient improved I have reviewed the patients home medicines and have made adjustments as needed   Social Determinants of Health:  Lives independently         Final Clinical Impression(s) / ED Diagnoses Final diagnoses:  Fall, initial encounter  Cystitis  Generalized weakness    Rx / DC Orders ED Discharge Orders     None         Gloris Manchester, MD 08/31/22 [phone]

## 2022-08-30 NOTE — Transitions of Care (Post Inpatient/ED Visit) (Signed)
   08/30/2022  Name: STEHANIE KUKULKA MRN: [id number] DOB: Jun 11, 1950  Today's TOC FU Call Status:    Attempted to reach the patient regarding the most recent Inpatient/ED visit.  Follow Up Plan: Additional outreach attempts will be made to reach the patient to complete the Transitions of Care (Post Inpatient/ED visit) call.   Irving Shows RNC, BSN RN Case Manager [facility] [phone]

## 2022-08-30 NOTE — H&P (Signed)
History and Physical    Patient: Allison Rollins VWU:[id number] DOB: 1950/08/14 DOA: 08/30/2022 DOS: the patient was seen and examined on 08/31/2022 PCP: Gabriel Earing, FNP  Patient coming from: Home  Chief Complaint:  Chief Complaint  Patient presents with   Fall   HPI: Allison Rollins is a 72 y.o. female with medical history significant of hypertension, hyperlipidemia, GERD, RLS, CHF, atrial fibrillation on Eliquis, COPD, chronic respiratory failure on with hypoxia on supplemental oxygen [place] at baseline who presents emergency department due to complaint of multiple falls at home.  She complained of dizziness which usually occur when she stands from sitting position, this is usually followed with generalized weakness in the legs which resulted in falling to the ground, she endorsed falling yesterday and on falling today, she hit the back of her head and denies loss of consciousness, bleeding, nausea, vomiting.  Patient also complained of burning sensation with urination and increased urinary frequency which started on Saturday (5/4) prior to being discharged from this hospital. Patient was recently admitted from 4/20 to 5/4 due to atrial fibrillation with RVR.  ED Course:  In the emergency department, temperature was 97.6, respiratory rate was 20/min, pulse 87 bpm, BP 117/94, O2 sat 100%.  Workup in the ED shows normal CBC, normal BMP except for chloride of 96, bicarb 33, blood glucose 104, albumin 3.3 magnesium 1.8, urinalysis was positive for UTI. CT head without contrast showed no acute intracranial pathology CT cervical spine without contrast showed no fracture or static subluxation of the cervical spine X-ray of the pelvis showed no fracture Chest x-ray showed no acute findings Patient was started on IV ceftriaxone, IV LR 500 mL x 1 was given.  Hospitalist was asked to admit patient for further evaluation and management.  Review of Systems: Review of systems as noted in the HPI. All other  systems reviewed and are negative.   Past Medical History:  Diagnosis Date   Anxiety    Atrial fibrillation (HCC) 2023   Eliquis   Chest pain 12/22/2015   CHF (congestive heart failure) (HCC)    COPD (chronic obstructive pulmonary disease) (HCC)    Depression    Headache    HTN (hypertension)    Hypokalemia 07/28/2013   Kidney stones    Osteopenia 01/22/2020   Parkinson's disease    Parkinsonian syndrome associated with symptomatic orthostatic hypotension    Midodrine   Pneumonia    Pre-diabetes    Requires continuous at home supplemental oxygen    3L [place]   S/P colonoscopy 10/15/2004   normal   Vitamin D deficiency 11/24/2018   Past Surgical History:  Procedure Laterality Date   CHOLECYSTECTOMY     COLONOSCOPY  10/15/2004   Normal rectum/Normal colon   COLONOSCOPY N/A 05/10/2016   Procedure: COLONOSCOPY;  Surgeon: West Bali, MD;  Location: [facility];  Service: Endoscopy;  Laterality: N/A;  10:30   ESOPHAGEAL MANOMETRY N/A 04/02/2019   Procedure: ESOPHAGEAL MANOMETRY (EM);  Surgeon: Napoleon Form, MD;  Location: [facility];  Service: Endoscopy;  Laterality: N/A;   ESOPHAGOGASTRODUODENOSCOPY  09/11/2010   Dysphagia likely multifactorial (possible Candida esophagitis, likely nonspecific esophageal motility disorder, and/or uncontrolled gastroesophageal reflux disease), status post empiric dilation   ESOPHAGOGASTRODUODENOSCOPY N/A 11/13/2018   Dr. Darrick Penna: No endoscopic esophageal abnormality to explain patient's dysphagia.  Esophagus dilated.  Moderate erosive/nodular gastritis with benign biopsies.   KIDNEY STONE SURGERY     SAVORY DILATION N/A 11/13/2018   Procedure: SAVORY DILATION;  Surgeon: West Bali, MD;  Location: [facility];  Service: Endoscopy;  Laterality: N/A;    Social History:  reports that she quit smoking about 27 years ago. Her smoking use included cigarettes. She started smoking about 57 years ago. She has a 7.50 pack-year smoking  history. She has been exposed to tobacco smoke. She has never used smokeless tobacco. She reports that she does not drink alcohol and does not use drugs.   Allergies  Allergen Reactions   Egg Solids, Whole    Farxiga [Dapagliflozin] Swelling   Penicillins Itching and Rash    Has patient had a PCN reaction causing immediate rash, facial/tongue/throat swelling, SOB or lightheadedness with hypotension: no Has patient had a PCN reaction causing severe rash involving mucus membranes or skin necrosis: no Has patient had a PCN reaction that required hospitalization: no Has patient had a PCN reaction occurring within the last 10 years: no If all of the above answers are "NO", then may proceed with Cephalosporin use.    Sulfa Antibiotics Rash    Family History  Problem Relation Age of Onset   Coronary artery disease Father    COPD Mother    Asthma Brother    Coronary artery disease Sister    Diabetes Sister    Coronary artery disease Brother    Coronary artery disease Sister      Prior to Admission medications   Medication Sig Start Date End Date Taking? Authorizing Provider  acetaminophen (TYLENOL) 325 MG tablet Take 2 tablets (650 mg total) by mouth every 6 (six) hours as needed for mild pain (or Fever >/= 101). 08/28/22   Shon Hale, MD  albuterol (PROVENTIL) (2.5 MG/3ML) 0.083% nebulizer solution Take 3 mLs (2.5 mg total) by nebulization every 6 (six) hours as needed for wheezing or shortness of breath. 01/12/18   Sonny Masters, FNP  albuterol (VENTOLIN HFA) 108 (90 Base) MCG/ACT inhaler Inhale 2 puffs into the lungs every 6 (six) hours as needed for wheezing or shortness of breath. 08/18/20   Gabriel Earing, FNP  alum & mag hydroxide-simeth (MAALOX/MYLANTA) 200-200-20 MG/5ML suspension Take 30 mLs by mouth every 6 (six) hours as needed for indigestion or heartburn. 07/19/22   Shahmehdi, Gemma Payor, MD  amiodarone (PACERONE) 200 MG tablet Take 1 tablet (200 mg total) by mouth daily.  Take amiodarone  200mg  twice daily for  3 weeks (thru 09/18/22),  then 200mg  once daily starting Monday 09/19/22 08/28/22 08/28/23  Shon Hale, MD  apixaban (ELIQUIS) 5 MG TABS tablet Take 1 tablet (5 mg total) by mouth 2 (two) times daily. 08/28/22 08/28/23  Shon Hale, MD  Ascorbic Acid (VITAMIN C PO) Take 1 tablet by mouth 3 (three) times daily.    [provider]  atorvastatin (LIPITOR) 40 MG tablet Take 1 tablet (40 mg total) by mouth daily. 08/28/22   Shon Hale, MD  CALCIUM PO Take 1 tablet by mouth daily.    [provider]  cholecalciferol (VITAMIN D3) 25 MCG (1000 UT) tablet Take 1,000 Units by mouth daily.    [provider]  famotidine (PEPCID) 20 MG tablet Take 20 mg by mouth daily. 07/29/22   [provider]  fluticasone Aleda Grana) 50 MCG/ACT nasal spray Use 2 sprays into each nostril every day Patient not taking: Reported on 08/25/2022 05/09/22   Gabriel Earing, FNP  furosemide (LASIX) 20 MG tablet Take 1 tablet (20 mg total) by mouth daily. 08/28/22   Shon Hale, MD  gabapentin (NEURONTIN)  300 MG capsule Take 300 mg by mouth See admin instructions. Take 600 mg by mouth three times daily and at bedtime. 07/29/22   [provider]  glucose blood (ACCU-CHEK AVIVA PLUS) test strip Test sugars daily 02/29/20   Dettinger, Elige Radon, MD  metoprolol tartrate (LOPRESSOR) 25 MG tablet Take 0.5 tablets (12.5 mg total) by mouth 2 (two) times daily. 08/28/22 09/27/22  Shon Hale, MD  midodrine (PROAMATINE) 10 MG tablet Take 1 tablet (10 mg total) by mouth 3 (three) times daily with meals. For Low Blood Pressure 08/28/22 08/28/23  Shon Hale, MD  nitroGLYCERIN (NITROSTAT) 0.4 MG SL tablet Place 1 tablet (0.4 mg total) under the tongue every 5 (five) minutes as needed for chest pain. 12/25/15   [name], MD  pantoprazole (PROTONIX) 40 MG tablet Take 1 tablet (40 mg total) by mouth 2 (two) times daily. 08/28/22   Shon Hale, MD   pramipexole (MIRAPEX) 0.5 MG tablet Take 1 tablet (0.5 mg total) by mouth at bedtime. 08/28/22   Shon Hale, MD  sertraline (ZOLOFT) 100 MG tablet Take 2 tablets (200 mg total) by mouth daily. 08/28/22   Shon Hale, MD    Physical Exam: BP 95/72   Pulse 98   Temp 97.6 F (36.4 C) (Oral)   Resp 17   Ht 5\' 6"  (1.676 m)   Wt 95.9 kg   SpO2 99%   BMI 34.12 kg/m   General: 72 y.o. year-old female well developed well nourished in no acute distress.  Alert and oriented x3. HEENT: NCAT, EOMI Neck: Supple, trachea medial Cardiovascular: Irregularly irregular rate and rhythm with no rubs or gallops.  No thyromegaly or JVD noted.  +2 lower extremity edema bilaterally. 2/4 pulses in all 4 extremities. Respiratory: Bilateral Rales in lower lobes on auscultation with no wheezes.   Abdomen: Soft, nontender nondistended with normal bowel sounds x4 quadrants. Muskuloskeletal: No cyanosis, clubbing noted bilaterally Neuro: CN II-XII intact, strength 5/5 x 4, sensation, reflexes intact Skin: No ulcerative lesions noted or rashes Psychiatry: Judgement and insight appear normal. Mood is appropriate for condition and setting          Labs on Admission:  Basic Metabolic Panel: Recent Labs  Lab 08/25/22 0515 08/26/22 0522 08/26/22 0546 08/27/22 0416 08/28/22 0420 08/30/22 1811  NA 140 139  --  138 139 139  K 3.1* 3.7  --  3.5 3.7 4.3  CL 99 98  --  99 100 96*  CO2 30 32  --  31 33* 33*  GLUCOSE 85 132*  --  123* 119* 104*  BUN 13 17  --  19 18 15   CREATININE 1.08* 1.24*  --  0.96 0.89 0.89  CALCIUM 8.4* 8.4*  --  8.4* 8.6* 8.7*  MG 1.6*  --  2.0 1.9  --  1.8   Liver Function Tests: Recent Labs  Lab 08/24/22 2208 08/25/22 0515 08/30/22 1811  AST 20 15 19   ALT 16 15 16   ALKPHOS 71 59 72  BILITOT 1.6* 1.5* 0.9  PROT 6.5 5.5* 6.3*  ALBUMIN 3.3* 2.9* 3.3*   No results for input(s): "LIPASE", "AMYLASE" in the last 168 hours. No results for input(s): "AMMONIA" in the last  168 hours. CBC: Recent Labs  Lab 08/24/22 2208 08/25/22 0515 08/26/22 0522 08/30/22 1811  WBC 8.0 7.3 7.6 7.8  NEUTROABS 6.2 5.3  --   --   HGB 13.1 11.9* 12.4 12.8  HCT 40.4 37.1 38.9 40.7  MCV 92.2 92.3 93.5  94.2  PLT 198 168 176 184   Cardiac Enzymes: No results for input(s): "CKTOTAL", "CKMB", "CKMBINDEX", "TROPONINI" in the last 168 hours.  BNP (last 3 results) Recent Labs    07/10/22 1550 08/10/22 1359 08/24/22 2208  BNP 767.0* 783.0* 1,029.0*    ProBNP (last 3 results) No results for input(s): "PROBNP" in the last 8760 hours.  CBG: Recent Labs  Lab 08/28/22 0742 08/28/22 1139  GLUCAP 95 115*    Radiological Exams on Admission: CT HEAD WO CONTRAST  Result Date: 08/30/2022 CLINICAL DATA:  Multiple falls, head injury EXAM: CT HEAD WITHOUT CONTRAST CT CERVICAL SPINE WITHOUT CONTRAST TECHNIQUE: Multidetector CT imaging of the head and cervical spine was performed following the standard protocol without intravenous contrast. Multiplanar CT image reconstructions of the cervical spine were also generated. RADIATION DOSE REDUCTION: This exam was performed according to the departmental dose-optimization program which includes automated exposure control, adjustment of the mA and/or kV according to patient size and/or use of iterative reconstruction technique. COMPARISON:  08/10/2022 FINDINGS: CT HEAD FINDINGS Brain: No evidence of acute infarction, hemorrhage, hydrocephalus, extra-axial collection or mass lesion/mass effect. Periventricular and deep white matter hypodensity. Vascular: No hyperdense vessel or unexpected calcification. Skull: Normal. Negative for fracture or focal lesion. Sinuses/Orbits: No acute finding. Other: None. CT CERVICAL SPINE FINDINGS Alignment: Normal. Skull base and vertebrae: No acute fracture. No primary bone lesion or focal pathologic process. Soft tissues and spinal canal: No prevertebral fluid or swelling. No visible canal hematoma. Disc levels:  Mild multilevel disc space height loss and osteophytosis. Upper chest: Negative. Other: None. IMPRESSION: 1. No acute intracranial pathology. Small-vessel white matter disease. 2. No fracture or static subluxation of the cervical spine. Electronically Signed   By: Jearld Lesch M.D.   On: 08/30/2022 18:57   CT CERVICAL SPINE WO CONTRAST  Result Date: 08/30/2022 CLINICAL DATA:  Multiple falls, head injury EXAM: CT HEAD WITHOUT CONTRAST CT CERVICAL SPINE WITHOUT CONTRAST TECHNIQUE: Multidetector CT imaging of the head and cervical spine was performed following the standard protocol without intravenous contrast. Multiplanar CT image reconstructions of the cervical spine were also generated. RADIATION DOSE REDUCTION: This exam was performed according to the departmental dose-optimization program which includes automated exposure control, adjustment of the mA and/or kV according to patient size and/or use of iterative reconstruction technique. COMPARISON:  08/10/2022 FINDINGS: CT HEAD FINDINGS Brain: No evidence of acute infarction, hemorrhage, hydrocephalus, extra-axial collection or mass lesion/mass effect. Periventricular and deep white matter hypodensity. Vascular: No hyperdense vessel or unexpected calcification. Skull: Normal. Negative for fracture or focal lesion. Sinuses/Orbits: No acute finding. Other: None. CT CERVICAL SPINE FINDINGS Alignment: Normal. Skull base and vertebrae: No acute fracture. No primary bone lesion or focal pathologic process. Soft tissues and spinal canal: No prevertebral fluid or swelling. No visible canal hematoma. Disc levels: Mild multilevel disc space height loss and osteophytosis. Upper chest: Negative. Other: None. IMPRESSION: 1. No acute intracranial pathology. Small-vessel white matter disease. 2. No fracture or static subluxation of the cervical spine. Electronically Signed   By: Jearld Lesch M.D.   On: 08/30/2022 18:57   DG Chest 1 View  Result Date: 08/30/2022 CLINICAL  DATA:  Three falls in the last day. EXAM: CHEST  1 VIEW COMPARISON:  Radiograph 08/24/2022 FINDINGS: Cardiomegaly is stable. Unchanged mediastinal contours. Suspected left pleural effusion and basilar opacity, without significant interval change. There is likely a diminutive right pleural effusion. Slight improvement in vascular congestion. No new airspace disease. No acute osseous abnormality on this portable  exam. IMPRESSION: 1. No acute findings. 2. Unchanged cardiomegaly and suspected pleural effusions, left greater than right. Electronically Signed   By: Narda Rutherford M.D.   On: 08/30/2022 18:52   DG Pelvis 1-2 Views  Result Date: 08/30/2022 CLINICAL DATA:  Three falls in the past day. EXAM: PELVIS - 1-2 VIEW COMPARISON:  Pelvis radiograph in right hip CT 09/05/2021 FINDINGS: The cortical margins of the bony pelvis are intact. No fracture. Pubic symphysis and sacroiliac joints are congruent. Both femoral heads are well-seated in the respective acetabula. Bilateral hip osteoarthritis. IMPRESSION: No fracture of the pelvis. Electronically Signed   By: Narda Rutherford M.D.   On: 08/30/2022 18:50    EKG: I independently viewed the EKG done and my findings are as followed: Atrial fibrillation with RVR  Assessment/Plan Present on Admission:  UTI (urinary tract infection)  Orthostatic hypotension  Chronic atrial fibrillation with RVR (HCC)  Essential hypertension  Chronic respiratory failure with hypoxia (HCC)  Mixed hyperlipidemia  GERD without esophagitis  Restless leg syndrome  Recurrent depression (HCC)  Chronic obstructive pulmonary disease (HCC)  Principal Problem:   UTI (urinary tract infection) Active Problems:   Mixed hyperlipidemia   Chronic atrial fibrillation with RVR (HCC)   Essential hypertension   GERD without esophagitis   Recurrent depression (HCC)   Restless leg syndrome   Chronic obstructive pulmonary disease (HCC)   Chronic respiratory failure with hypoxia (HCC)    Orthostatic hypotension   Recurrent falls   Hypoalbuminemia due to protein-calorie malnutrition (HCC)   Chronic combined systolic and diastolic CHF (congestive heart failure) (HCC)   UTI POA Patient complaining of dysuria and increased urinary frequency. Urinalysis was positive for UTI Patient was started on IV ceftriaxone, we shall continue with same at this time. Urine culture pending  Recurrent falls at home Continue fall precaution Patient was going to be discharged to SNF during last admission, but she preferred to go home health Continue PT/OT eval and treat  History of orthostatic hypotension This may have contributed to patient's recurrent falls Gentle hydration was provided Check orthostatic vital signs in the morning Continue midodrine  Hypoalbuminemia secondary to mild protein calorie malnutrition Albumin 3.3, protein supplement will be provided  A-fib with RVR Continue amiodarone, Eliquis Hold metoprolol due to soft BP  Chronic combined systolic and diastolic CHF Continue total input/output, daily weights and fluid restriction Continue heart healthy diet  Echocardiogram done on 07/11/2022 showed LVEF of 40 to 45%.  LV demonstrates global hypokinesis.  Mild concentric LVH.  LV diastolic parameters are indeterminate.    Lasix will be held at this time due to soft BP  Essential hypertension Hold BP meds at this time due to soft BP  Chronic respiratory failure with hypoxia Continue supplemental oxygen per home regimen  Mixed hyperlipidemia Continue Lipitor  GERD Continue Protonix, Pepcid  Restless leg syndrome Continue Mirapex  Depression Continue Zoloft  COPD (not in acute exacerbation) Continue albuterol per home regimen  DVT prophylaxis: Eliquis   Advance Care Planning: Full code  Consults: None  Family Communication: None at bedside  Severity of Illness: The appropriate patient status for this patient is INPATIENT. Inpatient status is  judged to be reasonable and necessary in order to provide the required intensity of service to ensure the patient's safety. The patient's presenting symptoms, physical exam findings, and initial radiographic and laboratory data in the context of their chronic comorbidities is felt to place them at high risk for further clinical deterioration. Furthermore, it is not anticipated  that the patient will be medically stable for discharge from the hospital within 2 midnights of admission.   * I certify that at the point of admission it is my clinical judgment that the patient will require inpatient hospital care spanning beyond 2 midnights from the point of admission due to high intensity of service, high risk for further deterioration and high frequency of surveillance required.*  Author: Frankey Shown, DO 08/31/2022 12:44 AM  For on call review [email or web address].

## 2022-08-30 NOTE — ED Notes (Signed)
Reminded pt of urine sample. Turned water on per pt request.  Educated that if she did not urinate the EDP might request a cath sample, pt said she did not want that but will work on urinating.

## 2022-08-31 ENCOUNTER — Ambulatory Visit (INDEPENDENT_AMBULATORY_CARE_PROVIDER_SITE_OTHER): Payer: Medicare HMO | Admitting: *Deleted

## 2022-08-31 ENCOUNTER — Encounter: Payer: Self-pay | Admitting: *Deleted

## 2022-08-31 ENCOUNTER — Ambulatory Visit: Payer: Self-pay | Admitting: *Deleted

## 2022-08-31 DIAGNOSIS — R296 Repeated falls: Secondary | ICD-10-CM

## 2022-08-31 DIAGNOSIS — I480 Paroxysmal atrial fibrillation: Secondary | ICD-10-CM

## 2022-08-31 DIAGNOSIS — N3000 Acute cystitis without hematuria: Secondary | ICD-10-CM | POA: Diagnosis not present

## 2022-08-31 DIAGNOSIS — I5032 Chronic diastolic (congestive) heart failure: Secondary | ICD-10-CM

## 2022-08-31 DIAGNOSIS — E46 Unspecified protein-calorie malnutrition: Secondary | ICD-10-CM | POA: Insufficient documentation

## 2022-08-31 DIAGNOSIS — I5042 Chronic combined systolic (congestive) and diastolic (congestive) heart failure: Secondary | ICD-10-CM | POA: Insufficient documentation

## 2022-08-31 LAB — COMPREHENSIVE METABOLIC PANEL
ALT: 14 U/L (ref 0–44)
AST: 18 U/L (ref 15–41)
Albumin: 2.9 g/dL — ABNORMAL LOW (ref 3.5–5.0)
Alkaline Phosphatase: 62 U/L (ref 38–126)
Anion gap: 8 (ref 5–15)
BUN: 14 mg/dL (ref 8–23)
CO2: 33 mmol/L — ABNORMAL HIGH (ref 22–32)
Calcium: 8.5 mg/dL — ABNORMAL LOW (ref 8.9–10.3)
Chloride: 100 mmol/L (ref 98–111)
Creatinine, Ser: 0.9 mg/dL (ref 0.44–1.00)
GFR, Estimated: >60 mL/min (ref >60)
Glucose, Bld: 104 mg/dL — ABNORMAL HIGH (ref 70–99)
Potassium: 3.7 mmol/L (ref 3.5–5.1)
Sodium: 141 mmol/L (ref 135–145)
Total Bilirubin: 0.7 mg/dL (ref 0.3–1.2)
Total Protein: 5.6 g/dL — ABNORMAL LOW (ref 6.5–8.1)

## 2022-08-31 LAB — CBC
HCT: 38.1 % (ref 36.0–46.0)
Hemoglobin: 11.8 g/dL — ABNORMAL LOW (ref 12.0–15.0)
MCH: 29.6 pg (ref 26.0–34.0)
MCHC: 31 g/dL (ref 30.0–36.0)
MCV: 95.7 fL (ref 80.0–100.0)
Platelets: 180 10*3/uL (ref 150–400)
RBC: 3.98 MIL/uL (ref 3.87–5.11)
RDW: 15 % (ref 11.5–15.5)
WBC: 6.4 10*3/uL (ref 4.0–10.5)
nRBC: 0 % (ref 0.0–0.2)

## 2022-08-31 LAB — MAGNESIUM: Magnesium: 1.8 mg/dL (ref 1.7–2.4)

## 2022-08-31 LAB — PHOSPHORUS: Phosphorus: 3.4 mg/dL (ref 2.5–4.6)

## 2022-08-31 MED ORDER — ALBUTEROL SULFATE (2.5 MG/3ML) 0.083% IN NEBU: 2.5000 mg | INHALATION_SOLUTION | Freq: Four times a day (QID) | RESPIRATORY_TRACT | Status: DC | PRN

## 2022-08-31 MED ORDER — SERTRALINE HCL 50 MG PO TABS
200.0000 mg | ORAL_TABLET | Freq: Every day | ORAL | Status: DC
  Administered 2022-08-31 – 2022-09-02 (×3): 200 mg via ORAL
  Filled 2022-08-31 (×3): qty 4

## 2022-08-31 MED ORDER — ATORVASTATIN CALCIUM 40 MG PO TABS
40.0000 mg | ORAL_TABLET | Freq: Every day | ORAL | Status: DC
  Administered 2022-08-31 – 2022-09-02 (×3): 40 mg via ORAL
  Filled 2022-08-31 (×3): qty 1

## 2022-08-31 MED ORDER — APIXABAN 5 MG PO TABS
5.0000 mg | ORAL_TABLET | Freq: Two times a day (BID) | ORAL | Status: DC
  Administered 2022-08-31 – 2022-09-02 (×6): 5 mg via ORAL
  Filled 2022-08-31 (×6): qty 1

## 2022-08-31 MED ORDER — ONDANSETRON HCL 4 MG/2ML IJ SOLN: 4.0000 mg | Freq: Four times a day (QID) | INTRAMUSCULAR | Status: DC | PRN

## 2022-08-31 MED ORDER — ONDANSETRON HCL 4 MG PO TABS: 4.0000 mg | ORAL_TABLET | Freq: Four times a day (QID) | ORAL | Status: DC | PRN

## 2022-08-31 MED ORDER — MIDODRINE HCL 5 MG PO TABS: 10.0000 mg | ORAL_TABLET | Freq: Three times a day (TID) | ORAL | Status: DC

## 2022-08-31 MED ORDER — ACETAMINOPHEN 325 MG PO TABS: 650.0000 mg | ORAL_TABLET | Freq: Four times a day (QID) | ORAL | Status: DC | PRN

## 2022-08-31 MED ORDER — PANTOPRAZOLE SODIUM 40 MG PO TBEC
40.0000 mg | DELAYED_RELEASE_TABLET | Freq: Two times a day (BID) | ORAL | Status: DC
  Administered 2022-08-31 – 2022-09-02 (×6): 40 mg via ORAL
  Filled 2022-08-31 (×6): qty 1

## 2022-08-31 MED ORDER — SODIUM CHLORIDE 0.9 % IV SOLN: INTRAVENOUS | Status: AC

## 2022-08-31 MED ORDER — ENSURE ENLIVE PO LIQD
237.0000 mL | Freq: Two times a day (BID) | ORAL | Status: DC
  Administered 2022-08-31 – 2022-09-01 (×4): 237 mL via ORAL

## 2022-08-31 MED ORDER — PRAMIPEXOLE DIHYDROCHLORIDE 1 MG PO TABS
0.5000 mg | ORAL_TABLET | Freq: Every day | ORAL | Status: DC
  Administered 2022-08-31 – 2022-09-01 (×3): 0.5 mg via ORAL
  Filled 2022-08-31 (×3): qty 1

## 2022-08-31 MED ORDER — FAMOTIDINE 20 MG PO TABS
20.0000 mg | ORAL_TABLET | Freq: Every day | ORAL | Status: DC
  Administered 2022-08-31 – 2022-09-02 (×3): 20 mg via ORAL
  Filled 2022-08-31 (×3): qty 1

## 2022-08-31 MED ORDER — MIDODRINE HCL 5 MG PO TABS
10.0000 mg | ORAL_TABLET | Freq: Three times a day (TID) | ORAL | Status: DC
  Administered 2022-08-31 – 2022-09-02 (×9): 10 mg via ORAL
  Filled 2022-08-31 (×9): qty 2

## 2022-08-31 MED ORDER — ACETAMINOPHEN 650 MG RE SUPP: 650.0000 mg | Freq: Four times a day (QID) | RECTAL | Status: DC | PRN

## 2022-08-31 MED ORDER — AMIODARONE HCL 200 MG PO TABS
200.0000 mg | ORAL_TABLET | Freq: Two times a day (BID) | ORAL | Status: DC
  Administered 2022-08-31 – 2022-09-02 (×6): 200 mg via ORAL
  Filled 2022-08-31 (×6): qty 1

## 2022-08-31 MED ORDER — SODIUM CHLORIDE 0.9 % IV SOLN
1.0000 g | INTRAVENOUS | Status: DC
  Administered 2022-08-31 – 2022-09-01 (×2): 1 g via INTRAVENOUS
  Filled 2022-08-31 (×2): qty 10

## 2022-08-31 NOTE — TOC Initial Note (Addendum)
Transition of Care [facility]) - Initial/Assessment Note    Patient Details  Name: Allison Rollins MRN: [id number] Date of Birth: 1951/03/23  Transition of Care [facility]) CM/SW Contact:    Villa Herb, LCSWA Phone Number: 08/31/2022, 11:11 AM  Clinical Narrative:                 Pt recently admitted and discharged home with [facility]. CSW spoke with pt in room to review SNF recommendation. Pt states that she is interested in SNF at [facility] or [facility]. CSW to complete referral and send to facilities for review. TOC to follow.   Addendum 1:20pm- CSW spoke with pt to review bed offer from [facility]. Pt would like to accept the bed offer at this time. INS to be started. TOC to follow.   Expected Discharge Plan: Skilled Nursing Facility Barriers to Discharge: Continued Medical Work up   Patient Goals and CMS Choice Patient states their goals for this hospitalization and ongoing recovery are:: go to SNF CMS Medicare.gov Compare Post Acute Care list provided to:: Patient Choice offered to / list presented to : Patient [place] ownership interest in [facility].provided to:: Patient    Expected Discharge Plan and Services In-house Referral: Clinical Social Work Discharge Planning Services: CM Consult Post Acute Care Choice: Skilled Nursing Facility Living arrangements for the past 2 months: Single Family Home                                      Prior Living Arrangements/Services Living arrangements for the past 2 months: Single Family Home Lives with:: Self Patient language and need for interpreter reviewed:: Yes Do you feel safe going back to the place where you live?: Yes      Need for Family Participation in Patient Care: Yes (Comment) Care giver support system in place?: Yes (comment)   Criminal Activity/Legal Involvement Pertinent to Current Situation/Hospitalization: No - Comment as needed  Activities of Daily Living Home Assistive Devices/Equipment: Walker (specify  type), Cane (specify quad or straight) ADL Screening (condition at time of admission) Patient's cognitive ability adequate to safely complete daily activities?: Yes Is the patient deaf or have difficulty hearing?: No Does the patient have difficulty seeing, even when wearing glasses/contacts?: No Does the patient have difficulty concentrating, remembering, or making decisions?: No Patient able to express need for assistance with ADLs?: No Does the patient have difficulty dressing or bathing?: No Independently performs ADLs?: Yes (appropriate for developmental age) Does the patient have difficulty walking or climbing stairs?: No Weakness of Legs: Both Weakness of Arms/Hands: Both  Permission Sought/Granted                  Emotional Assessment Appearance:: Appears stated age Attitude/Demeanor/Rapport: Engaged Affect (typically observed): Accepting Orientation: : Oriented to Self, Oriented to Place, Oriented to  Time, Oriented to Situation Alcohol / Substance Use: Not Applicable Psych Involvement: No (comment)  Admission diagnosis:  UTI (urinary tract infection) [N39.0] Patient Active Problem List   Diagnosis Date Noted   Recurrent falls 08/31/2022   Hypoalbuminemia due to protein-calorie malnutrition (HCC) 08/31/2022   Chronic combined systolic and diastolic CHF (congestive heart failure) (HCC) 08/31/2022   UTI (urinary tract infection) 08/30/2022   Abnormal chest x-ray 08/25/2022   Atrial fibrillation with RVR (HCC) 08/24/2022   Cardiomyopathy (HCC) 08/16/2022   Orthostatic hypotension 08/16/2022   Acute combined systolic and diastolic heart failure (HCC)  08/10/2022   Chronic respiratory failure with hypoxia (HCC) 08/10/2022   Neurocognitive deficits 07/21/2022   Chronic atrial fibrillation with RVR (HCC) 07/10/2022   Morbid obesity (HCC) 02/24/2022   Gastroesophageal reflux disease with esophagitis 02/24/2022   Dyslipidemia 09/05/2021   Parkinson's disease 09/05/2021    Chronic obstructive pulmonary disease (HCC) 09/05/2021   Edema 01/21/2021   Stage 3a chronic kidney disease (HCC) 06/17/2020   Osteopenia 01/22/2020   Neuropathy 10/21/2019   Achalasia of esophagus    Restless leg syndrome 11/24/2018   Erosive gastritis    Seasonal allergic rhinitis due to pollen 07/20/2018   Recurrent depression (HCC) 12/26/2015   Generalized anxiety disorder 03/12/2015   Prediabetes 03/12/2015   Back pain 03/12/2015   Stress incontinence 01/06/2015   Acute on chronic diastolic CHF (congestive heart failure) (HCC) 01/03/2015   Obesity (BMI 30-39.9) 07/01/2011   Essential hypertension 07/01/2011   Mixed hyperlipidemia 07/01/2011   GERD without esophagitis 09/08/2010   PCP:  Gabriel Earing, FNP Pharmacy:   [facility] [address], [place] - [address] [place] [address] Phone: [phone] Fax: [phone]  [facility] [place], [place] - [address] [place] [address] Phone: [phone] Fax: [phone]  [facility] - [place], [place] - [address] [place] [address] Phone: [phone] Fax: [phone]     Social Determinants of Health (SDOH) Social History: SDOH Screenings   Food Insecurity: Food Insecurity Present (08/31/2022)  Housing: Low Risk  (08/31/2022)  Transportation Needs: No Transportation Needs (08/31/2022)  Utilities: Not At Risk (08/31/2022)  Alcohol Screen: Low Risk  (04/16/2022)  Depression (PHQ2-9): Low Risk  (04/16/2022)  Recent Concern: Depression (PHQ2-9) - High Risk (02/24/2022)  Financial Resource Strain: Medium Risk (04/16/2022)  Physical Activity: Insufficiently Active (04/16/2022)  Social Connections: Moderately Integrated (04/16/2022)  Recent Concern: Social Connections - Socially Isolated (03/24/2022)  Stress: No Stress Concern Present (04/16/2022)  Tobacco Use: Medium Risk (08/31/2022)   SDOH Interventions:     Readmission Risk Interventions     08/31/2022   11:03 AM 08/25/2022   12:32 PM 08/13/2022    1:59 PM  Readmission Risk Prevention Plan  Transportation Screening Complete Complete Complete  PCP or Specialist Appt within 5-7 Days   Not Complete  Home Care Screening   Complete  Medication Review (RN CM)   Complete  HRI or Home Care Consult Complete Complete   Social Work Consult for Recovery Care Planning/Counseling Complete Complete   Palliative Care Screening Not Applicable Not Applicable   Medication Review Oceanographer) Complete Complete

## 2022-08-31 NOTE — [place] FL2 (Signed)
[place] MEDICAID FL2 LEVEL OF CARE FORM     IDENTIFICATION  Patient Name: Allison Rollins Birthdate: 07-25-1950 Sex: female Admission Date (Current Location): 08/30/2022  [facility] and [place] Number:  [employer] and Address:  [facility],  [address], [place] [address]      Provider Number: [id number]  Attending Physician Name and Address:  Erick Blinks, DO  Relative Name and Phone Number:  Plantz,Bradley (Son) [phone]    Current Level of Care: Hospital Recommended Level of Care: Skilled Nursing Facility Prior Approval Number:    Date Approved/Denied:   PASRR Number: [id number]  Discharge Plan: SNF    Current Diagnoses: Patient Active Problem List   Diagnosis Date Noted   Recurrent falls 08/31/2022   Hypoalbuminemia due to protein-calorie malnutrition (HCC) 08/31/2022   Chronic combined systolic and diastolic CHF (congestive heart failure) (HCC) 08/31/2022   UTI (urinary tract infection) 08/30/2022   Abnormal chest x-ray 08/25/2022   Atrial fibrillation with RVR (HCC) 08/24/2022   Cardiomyopathy (HCC) 08/16/2022   Orthostatic hypotension 08/16/2022   Acute combined systolic and diastolic heart failure (HCC) 08/10/2022   Chronic respiratory failure with hypoxia (HCC) 08/10/2022   Neurocognitive deficits 07/21/2022   Chronic atrial fibrillation with RVR (HCC) 07/10/2022   Morbid obesity (HCC) 02/24/2022   Gastroesophageal reflux disease with esophagitis 02/24/2022   Dyslipidemia 09/05/2021   Parkinson's disease 09/05/2021   Chronic obstructive pulmonary disease (HCC) 09/05/2021   Edema 01/21/2021   Stage 3a chronic kidney disease (HCC) 06/17/2020   Osteopenia 01/22/2020   Neuropathy 10/21/2019   Achalasia of esophagus    Restless leg syndrome 11/24/2018   Erosive gastritis    Seasonal allergic rhinitis due to pollen 07/20/2018   Recurrent depression (HCC) 12/26/2015   Generalized anxiety disorder 03/12/2015   Prediabetes  03/12/2015   Back pain 03/12/2015   Stress incontinence 01/06/2015   Acute on chronic diastolic CHF (congestive heart failure) (HCC) 01/03/2015   Obesity (BMI 30-39.9) 07/01/2011   Essential hypertension 07/01/2011   Mixed hyperlipidemia 07/01/2011   GERD without esophagitis 09/08/2010    Orientation RESPIRATION BLADDER Height & Weight     Self, Time, Situation, Place  O2 (3L, see D/C summary) Continent, External catheter Weight: 218 lb 0.6 oz (98.9 kg) Height:  5\' 6"  (167.6 cm)  BEHAVIORAL SYMPTOMS/MOOD NEUROLOGICAL BOWEL NUTRITION STATUS      Continent Diet (See D/C summary)  AMBULATORY STATUS COMMUNICATION OF NEEDS Skin   Extensive Assist Verbally Normal                       Personal Care Assistance Level of Assistance  Bathing, Feeding, Dressing Bathing Assistance: Maximum assistance Feeding assistance: Independent Dressing Assistance: Maximum assistance     Functional Limitations Info  Sight, Hearing, Speech Sight Info: Impaired Hearing Info: Adequate Speech Info: Adequate    SPECIAL CARE FACTORS FREQUENCY  PT (By licensed PT), OT (By licensed OT)     PT Frequency: 5 times weekly OT Frequency: 5 times weekly            Contractures Contractures Info: Not present    Additional Factors Info  Code Status, Allergies Code Status Info: FULL Allergies Info: egg, farxiga, penicillin, sulfa           Current Medications (08/31/2022):  This is the current hospital active medication list Current Facility-Administered Medications  Medication Dose Route Frequency Provider Last Rate Last Admin   acetaminophen (TYLENOL) tablet 650 mg  650 mg  Oral Q6H PRN Frankey Shown, DO       Or   acetaminophen (TYLENOL) suppository 650 mg  650 mg Rectal Q6H PRN Adefeso, Oladapo, DO       albuterol (PROVENTIL) (2.5 MG/3ML) 0.083% nebulizer solution 2.5 mg  2.5 mg Nebulization Q6H PRN Adefeso, Oladapo, DO       amiodarone (PACERONE) tablet 200 mg  200 mg Oral BID Adefeso,  Oladapo, DO   200 mg at 08/31/22 1610   apixaban (ELIQUIS) tablet 5 mg  5 mg Oral BID Adefeso, Oladapo, DO   5 mg at 08/31/22 0821   atorvastatin (LIPITOR) tablet 40 mg  40 mg Oral Daily Adefeso, Oladapo, DO   40 mg at 08/31/22 9604   cefTRIAXone (ROCEPHIN) 1 g in sodium chloride 0.9 % 100 mL IVPB  1 g Intravenous Q24H Adefeso, Oladapo, DO       famotidine (PEPCID) tablet 20 mg  20 mg Oral Daily Adefeso, Oladapo, DO   20 mg at 08/31/22 0821   feeding supplement (ENSURE ENLIVE / ENSURE PLUS) liquid 237 mL  237 mL Oral BID BM Adefeso, Oladapo, DO   237 mL at 08/31/22 0822   midodrine (PROAMATINE) tablet 10 mg  10 mg Oral TID WC Adefeso, Oladapo, DO   10 mg at 08/31/22 0512   ondansetron (ZOFRAN) tablet 4 mg  4 mg Oral Q6H PRN Adefeso, Oladapo, DO       Or   ondansetron (ZOFRAN) injection 4 mg  4 mg Intravenous Q6H PRN Adefeso, Oladapo, DO       pantoprazole (PROTONIX) EC tablet 40 mg  40 mg Oral BID Adefeso, Oladapo, DO   40 mg at 08/31/22 5409   pramipexole (MIRAPEX) tablet 0.5 mg  0.5 mg Oral QHS Adefeso, Oladapo, DO   0.5 mg at 08/31/22 0141   sertraline (ZOLOFT) tablet 200 mg  200 mg Oral Daily Adefeso, Oladapo, DO   200 mg at 08/31/22 8119     Discharge Medications: Please see discharge summary for a list of discharge medications.  Relevant Imaging Results:  Relevant Lab Results:   Additional Information SS# [id number]  Villa Herb, [place]

## 2022-08-31 NOTE — Chronic Care Management (AMB) (Signed)
  Chronic Care Management   CCM RN Visit Note  08/31/2022 Name: Allison Rollins MRN: [id number] DOB: Sep 17, 1950  Subjective: Allison Rollins is a 72 y.o. year old female who is a primary care patient of Gabriel Earing, FNP. The patient was referred to the Chronic Care Management team for assistance with care management needs subsequent to provider initiation of CCM services and plan of care.    Today's Visit:  Collaboration with LCSW Danford Bad  for  update on patient status, short and long term plan of care .        Goals Addressed             This Visit's Progress    CCM (ATRIAL FIBRILLATION) EXPECTED OUTCOME: MONITOR, SELF-MANAGE AND REDUCE SYMPTOMS OF ATRIAL FIBRILLATION       Current Barriers:  Knowledge Deficits related to Atrial Fibrillation management Chronic Disease Management support and education needs related to Atrial Fibrillation Patient states recently diagnosed with Atrial Fibrillation, followed up at A-Fib clinic on 06/16/22, pt reports she is taking blood thinner as prescribed Patient reports she has all medications and taking as prescribed RN care manager noted upon chart review and TOC attempt today, pt admitted to [facility] on 08/30/22 for a fall at home.  RN care manager will continue to follow.  Planned Interventions: Counseled on increased risk of stroke due to Afib and benefits of anticoagulation for stroke prevention           Reviewed importance of adherence to anticoagulant exactly as prescribed Counseled on importance of regular laboratory monitoring as prescribed Reviewed upcoming scheduled appointments A-Fib action plan reinforced RN care manager collaborated with LCSW Mardene Celeste Saporito who reports plan for patient is short term rehab and then will ask to consider long term memory care.  Symptom Management: Take medications as prescribed   Attend all scheduled provider appointments Call pharmacy for medication refills 3-7 days in advance of  running out of medications Attend church or other social activities Perform all self care activities independently  Perform IADL's (shopping, preparing meals, housekeeping, managing finances) independently Call provider office for new concerns or questions  Work with the social worker to address care coordination needs and will continue to work with the clinical team to address health care and disease management related needs - begin a symptom diary - bring symptom diary to all appointments - check pulse (heart) rate before taking medicine - check pulse (heart) rate once a day - make a plan to exercise regularly - make a plan to eat healthy - keep all lab appointments - take medicine as prescribed - wear medical alert identification Follow Atial Fibrillation action plan Continue working with social worker  Follow Up Plan: Telephone follow up appointment with care management team member scheduled for:   09/07/22 at 130 pm          Plan:Telephone follow up appointment with care management team member scheduled for:  will continue to follow  Irving Shows [facility], BSN RN Case Manager [facility] [phone]

## 2022-08-31 NOTE — Plan of Care (Signed)
  Problem: Acute Rehab PT Goals(only PT should resolve) Goal: Pt Will Go Supine/Side To Sit Outcome: Progressing Flowsheets (Taken 08/31/2022 1353) Pt will go Supine/Side to Sit:  with min guard assist  with minimal assist Goal: Patient Will Transfer Sit To/From Stand Outcome: Progressing Flowsheets (Taken 08/31/2022 1353) Patient will transfer sit to/from stand:  with min guard assist  with minimal assist Goal: Pt Will Transfer Bed To Chair/Chair To Bed Outcome: Progressing Flowsheets (Taken 08/31/2022 1353) Pt will Transfer Bed to Chair/Chair to Bed:  min guard assist  with min assist Goal: Pt Will Ambulate Outcome: Progressing Flowsheets (Taken 08/31/2022 1353) Pt will Ambulate:  with minimal assist  with rolling walker  50 feet   1:54 PM, 08/31/22 Ocie Bob, MPT Physical Therapist with [facility] [phone] office [phone] mobile phone

## 2022-08-31 NOTE — Patient Outreach (Signed)
Care Coordination   Follow Up Visit Note   08/31/2022  Name: Allison Rollins MRN: [id number] DOB: 11-May-1950  Allison Rollins is a 72 y.o. year old female who sees Allison Earing, FNP for primary care. I spoke with Allison Rollins and daughter-in-law, Allison Rollins by phone today.  What matters to the patients health and wellness today?  Find Help in My Community.   Goals Addressed               This Visit's Progress     Find Help in My Community. (pt-stated)   On track     Care Coordination Interventions:  Interventions Today    Flowsheet Row Most Recent Value  Chronic Disease   Chronic disease during today's visit Hypertension (HTN), Chronic Obstructive Pulmonary Disease (COPD), Other, Chronic Kidney Disease/End Stage Renal Disease (ESRD)  [Parkinson's Disease, Neuropathy, Recurrent Depression & Generalized Anxiety Disorder]  General Interventions   General Interventions Discussed/Reviewed General Interventions Discussed, General Interventions Reviewed, Annual Eye Exam, Labs, Durable Medical Equipment (DME), Vaccines, Health Screening, Community Resources, Doctor Visits, Level of Care  Labs Hgb A1c annually  Vaccines COVID-19, Flu, Pneumonia, RSV, Shingles  Doctor Visits Discussed/Reviewed Doctor Visits Discussed, Doctor Visits Reviewed, Annual Wellness Visits, PCP, Specialist  Health Screening Colonoscopy, Bone Density, Mammogram  Durable Medical Equipment (DME) BP Cuff, Walker, Psychologist, forensic  PCP/Specialist Visits Compliance with follow-up visit  Level of Care Applications, Assisted Living, Personal Care Counsellor, Personal Care Services, Other  [facility] Health Financial Assistance Application]  Exercise Interventions   Exercise Discussed/Reviewed Exercise Discussed, Exercise Reviewed, Physical Activity, Assistive device use and maintanence  Physical Activity Discussed/Reviewed Physical Activity Discussed, Physical Activity Reviewed, Types  of exercise  Education Interventions   Education Provided Provided Therapist, sports, Provided Education, Provided Web-based Education  Provided Verbal Education On Nutrition, Eye Care, Mental Health/Coping with Illness, Applications, Exercise, Medication, Development worker, community, [employer], When to see the doctor  Applications Medicaid, Personal Care Services, Other  [employer] Health Financial Assistance Application]  Mental Health Interventions   Mental Health Discussed/Reviewed Mental Health Discussed, Mental Health Reviewed, Coping Strategies, Crisis, Anxiety, Depression  Nutrition Interventions   Nutrition Discussed/Reviewed Nutrition Discussed, Nutrition Reviewed, Fluid intake, Portion sizes, Decreasing sugar intake, Decreasing salt, Decreasing fats, Increaing proteins  Pharmacy Interventions   Pharmacy Dicussed/Reviewed Pharmacy Topics Discussed, Pharmacy Topics Reviewed, Medication Adherence, Affording Medications  Safety Interventions   Safety Discussed/Reviewed Safety Discussed, Safety Reviewed, Fall Risk  Advanced Directive Interventions   Advanced Directives Discussed/Reviewed Advanced Directives Discussed, Advanced Directives Reviewed     Active Listening & Reflection Utilized.  Verbalization of Feelings Encouraged.  Emotional Support Provided. Feelings of Caregiver Burnout Validated. Caregiver Stress Acknowledged. Caregiver Resources Reviewed. Caregiver Support Groups Mailed. Self-Enrollment in Caregiver Support Group of Interest Emphasized. Problem Solving Interventions Enacted. Solution-Focused Strategies Employed. Task-Centered Solutions Implemented. Acceptance & Commitment Therapy Initiated. Cognitive Behavioral Therapy Engaged. Client Centered Therapy Performed. CSW Collaboration with Daughter-in-Law, Allison Rollins to Confirm Patient's Readmission to [facility] (# [phone]) on 08/30/2022, As a Result of 3 Falls Sustained within 24 Hours of Being  Back Home, After Having Just Been Discharged from [facility] on 08/28/2022. CSW Collaboration with Daughter-in-Law, Allison Rollins to Confirm Patient's Continued Disinterest in Receiving Placement into A Higher Level of Care (I.e Memory Care Assisted Living, Extended Care, Family Care, Etc.), Requesting to Return Home with Home Health Services in Place, Upon Medical Clearance & Discharge from [facility] (# [phone]). CSW Collaboration with Daughter-in-Law, Allison Rollins to Encourage Consideration of Placement for Patient in A Skilled Holiday representative, to [employer], Upon Medical Clearance & Discharge from [facility] (# [phone]). CSW Collaboration with Daughter-in-Law, Allison Rollins to [employer] with Allison Rollins, LCSWA Transition of Care CM/SW with [facility] (# [phone]), in An Effort to Arrange Short-Term Rehabilitative Services for Patient in A Skilled Nursing Facility of Interest, Upon Medical Clearance & Discharge from [facility]. CSW Collaboration with Daughter-in-Law, Allison Rollins to Encourage Consideration of Placement for Patient in A Long-Term Care Facility (I.e Memory Care Assisted Living, Extended Care, Family Care, Etc.), Upon Completion of Short-Term Rehabilitative Services in A Skilled Nursing Facility. CSW Collaboration with Daughter-in-Law, Allison Rollins to [employer] with Allison Rollins, Medicaid Case Worker with [facility] Department of Social Services [phone]), to Request Assistance with Changing Patient's Medicaid Application to Special Assistance Long-Term Care Medicaid, if Agreeable to Long-Term Care Placement. CSW Collaboration with Daughter-in-Law, Allison Rollins to [employer] on Restaurant manager, fast food for Personal Care Services & Submission to [employer] [phone]) for  Processing, Until Discharge Disposition is Determined & Patient is Approved for Medicaid, through [facility] of Social Services [phone]). CSW Collaboration with Daughter-in-Law, Allison Rollins to [employer] with CSW [phone]# [phone]), if She Has Questions, Needs Assistance, or If Additional Social Work Needs Are Identified Between Now & Our Next Scheduled Follow-Up Outreach Call.      SDOH assessments and interventions completed:  Yes.  Care Coordination Interventions:  Yes, provided.   Follow up plan: Follow up call scheduled for 09/14/2022 at 9:30 am.  Encounter Outcome:  Pt. Visit Completed.   Danford Bad, BSW, MSW, LCSW  Licensed Restaurant manager, fast food [facility] System  Mailing [address] [place], [place] [address] Physical Address-[address] [name], [place] [address] Toll Free Main # [phone] Fax # [phone] Cell # [phone] [email or web address]

## 2022-08-31 NOTE — Evaluation (Signed)
Physical Therapy Evaluation Patient Details Name: Allison Rollins MRN: [id number] DOB: [birth year] Today's Date: 08/31/2022  History of Present Illness  Allison Rollins is a 72 y.o. female with medical history significant of hypertension, hyperlipidemia, GERD, RLS, CHF, atrial fibrillation on Eliquis, COPD, chronic respiratory failure on with hypoxia on supplemental oxygen WPL at baseline who presents emergency department due to complaint of multiple falls at home.  She complained of dizziness which usually occur when she stands from sitting position, this is usually followed with generalized weakness in the legs which resulted in falling to the ground, she endorsed falling yesterday and on falling today, she hit the back of her head and denies loss of consciousness, bleeding, nausea, vomiting.  Patient also complained of burning sensation with urination and increased urinary frequency which started on Saturday (5/4) prior to being discharged from this hospital.  Patient was recently admitted from 4/20 to 5/4 due to atrial fibrillation with RVR.   Clinical Impression  Patient demonstrates slow labored movement for sitting up at bedside with [facility] raised, unsteady labored cadence with flexed trunk ambulating in room and limited mostly due to c/o fatigue and generalized weakness.  Patient able to transfer to commode in bathroom requiring frequent verbal/tactile cueing  for safety and tolerated sitting up in chair after therapy - nursing staff notified.  Patient will benefit from continued skilled physical therapy in hospital and recommended venue below to increase strength, balance, endurance for safe ADLs and gait.         Recommendations for follow up therapy are one component of a multi-disciplinary discharge planning process, led by the attending physician.  Recommendations may be updated based on patient status, additional functional criteria and insurance authorization.  Follow Up Recommendations Can  patient physically be transported by private vehicle: Yes     Assistance Recommended at Discharge Set up Supervision/Assistance  Patient can return home with the following  A lot of help with walking and/or transfers;A little help with bathing/dressing/bathroom;Help with stairs or ramp for entrance;Assistance with cooking/housework    Equipment Recommendations None recommended by PT  Recommendations for Other Services       Functional Status Assessment Patient has had a recent decline in their functional status and demonstrates the ability to make significant improvements in function in a reasonable and predictable amount of time.     Precautions / Restrictions Precautions Precautions: Fall Restrictions Weight Bearing Restrictions: No      Mobility  Bed Mobility Overal bed mobility: Needs Assistance Bed Mobility: Sit to Supine     Supine to sit: Min assist, HOB elevated     General bed mobility comments: slow labored movement    Transfers Overall transfer level: Needs assistance Equipment used: Rolling walker (2 wheels) Transfers: Sit to/from Stand, Bed to chair/wheelchair/BSC Sit to Stand: Min assist   Step pivot transfers: Min assist       General transfer comment: slow labored movement with flexed turnk, able to transfer to commode in bathroom and later to chair at bedside    Ambulation/Gait Ambulation/Gait assistance: Mod assist Gait Distance (Feet): 16 Feet Assistive device: Rolling walker (2 wheels) Gait Pattern/deviations: Decreased step length - left, Decreased stance time - right, Decreased stride length, Trunk flexed Gait velocity: slow     General Gait Details: slow unsteady labored cadence limited mostly due to c/o fatigue  Stairs            Wheelchair Mobility    Modified Rankin (Stroke Patients Only)  Balance Overall balance assessment: Needs assistance Sitting-balance support: Feet supported, No upper extremity  supported Sitting balance-Leahy Scale: Fair Sitting balance - Comments: fair/good seated at EOB   Standing balance support: Reliant on assistive device for balance, During functional activity, Bilateral upper extremity supported Standing balance-Leahy Scale: Poor Standing balance comment: fair/poor using RW                             Pertinent Vitals/Pain Pain Assessment Pain Assessment: Faces Faces Pain Scale: Hurts little more Pain Location: generalized pain all over, mostly in BLE Pain Descriptors / Indicators: Sore, Discomfort Pain Intervention(s): Limited activity within patient's tolerance, Monitored during session, Repositioned    Home Living Family/patient expects to be discharged to:: Private residence Living Arrangements: Alone Available Help at Discharge: Family Type of Home: House Home Access: Ramped entrance       Home Layout: One level Home Equipment: Agricultural consultant (2 wheels);Cane - single point      Prior Function Prior Level of Function : Independent/Modified Independent             Mobility Comments: household ambulator using RW ADLs Comments: Assisted by family, son completes grocery shopping and brings some meals to her.     Hand Dominance        Extremity/Trunk Assessment   Upper Extremity Assessment Upper Extremity Assessment: Generalized weakness    Lower Extremity Assessment Lower Extremity Assessment: Generalized weakness    Cervical / Trunk Assessment Cervical / Trunk Assessment: Kyphotic  Communication   Communication: No difficulties  Cognition Arousal/Alertness: Awake/alert Behavior During Therapy: WFL for tasks assessed/performed Overall Cognitive Status: Within Functional Limits for tasks assessed                                          General Comments      Exercises     Assessment/Plan    PT Assessment Patient needs continued PT services  PT Problem List Decreased  strength;Decreased activity tolerance;Decreased balance;Decreased mobility       PT Treatment Interventions DME instruction;Gait training;Stair training;Functional mobility training;Therapeutic activities;Therapeutic exercise;Patient/family education;Balance training    PT Goals (Current goals can be found in the Care Plan section)  Acute Rehab PT Goals Patient Stated Goal: return home after rehab PT Goal Formulation: With patient Time For Goal Achievement: 09/14/22 Potential to Achieve Goals: Good    Frequency Min 3X/week     Co-evaluation               AM-PAC PT "6 Clicks" Mobility  Outcome Measure Help needed turning from your back to your side while in a flat bed without using bedrails?: A Little Help needed moving from lying on your back to sitting on the side of a flat bed without using bedrails?: A Little Help needed moving to and from a bed to a chair (including a wheelchair)?: A Little Help needed standing up from a chair using your arms (e.g., wheelchair or bedside chair)?: A Little Help needed to walk in hospital room?: A Lot Help needed climbing 3-5 steps with a railing? : A Lot 6 Click Score: 16    End of Session Equipment Utilized During Treatment: Oxygen Activity Tolerance: Patient tolerated treatment well;Patient limited by fatigue Patient left: in chair;with call bell/phone within reach Nurse Communication: Mobility status PT Visit Diagnosis: Unsteadiness on feet (R26.81);Other abnormalities of  gait and mobility (R26.89);Muscle weakness (generalized) (M62.81)    Time: 5409-8119 PT Time Calculation (min) (ACUTE ONLY): 33 min   Charges:   PT Evaluation $PT Eval Moderate Complexity: 1 Mod PT Treatments $Therapeutic Activity: 23-37 mins        1:51 PM, 08/31/22 Ocie Bob, MPT Physical Therapist with [facility] [phone] office [phone] mobile phone

## 2022-08-31 NOTE — Patient Instructions (Signed)
Visit Information  Thank you for taking time to visit with me today. Please don't hesitate to contact me if I can be of assistance to you.   Following are the goals we discussed today:   Goals Addressed               This Visit's Progress     Find Help in My Community. (pt-stated)   On track     Care Coordination Interventions:  Interventions Today    Flowsheet Row Most Recent Value  Chronic Disease   Chronic disease during today's visit Hypertension (HTN), Chronic Obstructive Pulmonary Disease (COPD), Other, Chronic Kidney Disease/End Stage Renal Disease (ESRD)  [Parkinson's Disease, Neuropathy, Recurrent Depression & Generalized Anxiety Disorder]  General Interventions   General Interventions Discussed/Reviewed General Interventions Discussed, General Interventions Reviewed, Annual Eye Exam, Labs, Durable Medical Equipment (DME), Vaccines, Health Screening, Community Resources, Doctor Visits, Level of Care  Labs Hgb A1c annually  Vaccines COVID-19, Flu, Pneumonia, RSV, Shingles  Doctor Visits Discussed/Reviewed Doctor Visits Discussed, Doctor Visits Reviewed, Annual Wellness Visits, PCP, Specialist  Health Screening Colonoscopy, Bone Density, Mammogram  Durable Medical Equipment (DME) BP Cuff, Walker, Psychologist, forensic  PCP/Specialist Visits Compliance with follow-up visit  Level of Care Applications, Assisted Living, Personal Care Counsellor, Personal Care Services, Other  [facility] Financial Assistance Application]  Exercise Interventions   Exercise Discussed/Reviewed Exercise Discussed, Exercise Reviewed, Physical Activity, Assistive device use and maintanence  Physical Activity Discussed/Reviewed Physical Activity Discussed, Physical Activity Reviewed, Types of exercise  Education Interventions   Education Provided Provided Therapist, sports, Provided Education, Provided Web-based Education  Provided Verbal Education On Nutrition, Eye  Care, Mental Health/Coping with Illness, Applications, Exercise, Medication, Development worker, community, [employer], When to see the doctor  Applications Medicaid, Personal Care Services, Other  [employer] Health Financial Assistance Application]  Mental Health Interventions   Mental Health Discussed/Reviewed Mental Health Discussed, Mental Health Reviewed, Coping Strategies, Crisis, Anxiety, Depression  Nutrition Interventions   Nutrition Discussed/Reviewed Nutrition Discussed, Nutrition Reviewed, Fluid intake, Portion sizes, Decreasing sugar intake, Decreasing salt, Decreasing fats, Increaing proteins  Pharmacy Interventions   Pharmacy Dicussed/Reviewed Pharmacy Topics Discussed, Pharmacy Topics Reviewed, Medication Adherence, Affording Medications  Safety Interventions   Safety Discussed/Reviewed Safety Discussed, Safety Reviewed, Fall Risk  Advanced Directive Interventions   Advanced Directives Discussed/Reviewed Advanced Directives Discussed, Advanced Directives Reviewed     Active Listening & Reflection Utilized.  Verbalization of Feelings Encouraged.  Emotional Support Provided. Feelings of Caregiver Burnout Validated. Caregiver Stress Acknowledged. Caregiver Resources Reviewed. Caregiver Support Groups Mailed. Self-Enrollment in Caregiver Support Group of Interest Emphasized. Problem Solving Interventions Enacted. Solution-Focused Strategies Employed. Task-Centered Solutions Implemented. Acceptance & Commitment Therapy Initiated. Cognitive Behavioral Therapy Engaged. Client Centered Therapy Performed. CSW Collaboration with Daughter-in-Law, [name] to Confirm Patient's Readmission to [facility] (# [phone]) on 08/30/2022, As a Result of 3 Falls Sustained within 24 Hours of Being Back Home, After Having Just Been Discharged from [facility] on 08/28/2022. CSW Collaboration with Daughter-in-Law, [name] to Confirm Patient's Continued  Disinterest in Receiving Placement into A Higher Level of Care (I.e Memory Care Assisted Living, Extended Care, Family Care, Etc.), Requesting to Return Home with Home Health Services in Place, Upon Medical Clearance & Discharge from [facility] (# [phone]). CSW Collaboration with Daughter-in-Law, [name] to Encourage Consideration of Placement for Patient in A Skilled Holiday representative, to [employer], Upon Medical Clearance & Discharge from [employer]  [facility] (# [phone]). CSW Collaboration with Daughter-in-Law, [name] to [employer] with Villa Herb, LCSWA Transition of Care CM/SW with [facility] (# [phone]), in An Effort to Arrange Short-Term Rehabilitative Services for Patient in A Skilled Nursing Facility of Interest, Upon Medical Clearance & Discharge from [facility]. CSW Collaboration with Daughter-in-Law, [name] to Encourage Consideration of Placement for Patient in A Long-Term Care Facility (I.e Memory Care Assisted Living, Extended Care, Family Care, Etc.), Upon Completion of Short-Term Rehabilitative Services in A Skilled Nursing Facility. CSW Collaboration with Daughter-in-Law, [name] to [employer] with Kristin Bruins, Medicaid Case Worker with [facility] Department of Social Services [phone]), to Request Assistance with Changing Patient's Medicaid Application to Special Assistance Long-Term Care Medicaid, if Agreeable to Long-Term Care Placement. CSW Collaboration with Daughter-in-Law, [name] to [employer] on Restaurant manager, fast food for Personal Care Services & Submission to [employer] [phone]) for Processing, Until Discharge Disposition is Determined & Patient is Approved for Medicaid, through [facility] of Social Services [phone]). CSW  Collaboration with Daughter-in-Law, [name] to [employer] with CSW [phone]# [phone]), if She Has Questions, Needs Assistance, or If Additional Social Work Needs Are Identified Between Now & Our Next Scheduled Follow-Up Outreach Call.      Our next appointment is by telephone on 09/14/2022 at 9:30 am.  Please call the care guide team at [phone] if you need to cancel or reschedule your appointment.   If you are experiencing a Mental Health or Behavioral Health Crisis or need someone to talk to, please call the Suicide and Crisis Lifeline: 988 call the [place] National Suicide Prevention Lifeline: 574-130-5800 or TTY: 618-099-3751 TTY 705-016-6253) to talk to a trained counselor call 1-800-273-TALK (toll free, 24 hour hotline) go to [facility] [address] [place] [phone]) call the [facility] Crisis Line: [phone] call 911  Patient verbalizes understanding of instructions and care plan provided today and agrees to view in MyChart. Active MyChart status and patient understanding of how to access instructions and care plan via MyChart confirmed with patient.     Telephone follow up appointment with care management team member scheduled for:  09/14/2022 at 9:30 am.  Danford Bad, BSW, MSW, LCSW  Licensed Clinical Social Worker  [facility]  Mailing [place]. [address] [place], [place] [address] Physical Address-[address], [place], [place] [address] Toll Free Main # [phone] Fax # [phone] Cell # [phone] [email or web address]

## 2022-08-31 NOTE — Progress Notes (Signed)
PROGRESS NOTE    Allison Rollins  WUJ:[id number] DOB: 1950-07-18 DOA: 08/30/2022 PCP: Gabriel Earing, FNP   Brief Narrative:    Allison Rollins is a 72 y.o. female with medical history significant of hypertension, hyperlipidemia, GERD, RLS, CHF, atrial fibrillation on Eliquis, COPD, chronic respiratory failure on with hypoxia on supplemental oxygen WPL at baseline who presents emergency department due to complaint of multiple falls at home.  She notes ongoing dizziness and was recommended for SNF last admission, but preferred to go home with home health.  She is also noted to have UTI and was started on IV Rocephin.  PT recommending SNF.  Assessment & Plan:   Principal Problem:   UTI (urinary tract infection) Active Problems:   Mixed hyperlipidemia   Chronic atrial fibrillation with RVR (HCC)   Essential hypertension   GERD without esophagitis   Recurrent depression (HCC)   Restless leg syndrome   Chronic obstructive pulmonary disease (HCC)   Chronic respiratory failure with hypoxia (HCC)   Orthostatic hypotension   Recurrent falls   Hypoalbuminemia due to protein-calorie malnutrition (HCC)   Chronic combined systolic and diastolic CHF (congestive heart failure) (HCC)  Assessment and Plan:   UTI POA Patient complaining of dysuria and increased urinary frequency. Urinalysis was positive for UTI Patient was started on IV ceftriaxone, we shall continue with same at this time. Urine culture pending   Recurrent falls at home Continue fall precaution Patient was going to be discharged to SNF during last admission, but she preferred to go home health PT recommending SNF   History of orthostatic hypotension This may have contributed to patient's recurrent falls Gentle hydration was provided Check orthostatic vital signs in the morning Continue midodrine   Hypoalbuminemia secondary to mild protein calorie malnutrition Albumin 3.3, protein supplement will be provided   A-fib  with RVR Continue amiodarone, Eliquis Hold metoprolol due to soft BP   Chronic combined systolic and diastolic CHF Continue total input/output, daily weights and fluid restriction Continue heart healthy diet      Echocardiogram done on 07/11/2022 showed LVEF of 40 to 45%.  LV demonstrates global hypokinesis.  Mild concentric LVH.  LV diastolic parameters are indeterminate.    Lasix will be held at this time due to soft BP   Essential hypertension Hold BP meds at this time due to soft BP   Chronic respiratory failure with hypoxia Continue supplemental oxygen per home regimen   Mixed hyperlipidemia Continue Lipitor   GERD Continue Protonix, Pepcid   Restless leg syndrome Continue Mirapex   Depression Continue Zoloft   COPD (not in acute exacerbation) Continue albuterol per home regimen  Obesity BMI 35.19    DVT prophylaxis: Eliquis Code Status: Full Family Communication: None at bedside Disposition Plan:  Status is: Inpatient Remains inpatient appropriate because: Need for IV medications.   Consultants:  None  Procedures:  None  Antimicrobials:  Anti-infectives (From admission, onward)    Start     Dose/Rate Route Frequency Ordered Stop   08/31/22 2200  cefTRIAXone (ROCEPHIN) 1 g in sodium chloride 0.9 % 100 mL IVPB        1 g 200 mL/hr over 30 Minutes Intravenous Every 24 hours 08/31/22 0006     08/30/22 2200  cefTRIAXone (ROCEPHIN) 1 g in sodium chloride 0.9 % 100 mL IVPB        1 g 200 mL/hr over 30 Minutes Intravenous  Once 08/30/22 2152 08/31/22 0005      Subjective: Patient seen  and evaluated today with no new acute complaints or concerns. No acute concerns or events noted overnight.  Objective: Vitals:   08/31/22 0113 08/31/22 0454 08/31/22 0743 08/31/22 1300  BP: 100/80 93/68 (!) 99/54 104/76  Pulse:  95 96 (!) 105  Resp: 18 18 16 17   Temp: 98.4 F (36.9 C) 98.1 F (36.7 C) 98.2 F (36.8 C) 98.3 F (36.8 C)  TempSrc:   Oral Oral  SpO2:  100% 100% 100% 93%  Weight: 98.9 kg     Height:        Intake/Output Summary (Last 24 hours) at 08/31/2022 1404 Last data filed at 08/31/2022 1300 Gross per 24 hour  Intake 1195.22 ml  Output 300 ml  Net 895.22 ml   Filed Weights   08/30/22 1655 08/31/22 0113  Weight: 95.9 kg 98.9 kg    Examination:  General exam: Appears calm and comfortable  Respiratory system: Clear to auscultation. Respiratory effort normal.  Nasal cannula oxygen Cardiovascular system: S1 & S2 heard, RRR.  Gastrointestinal system: Abdomen is soft Central nervous system: Alert and awake Extremities: No edema Skin: No significant lesions noted Psychiatry: Flat affect.    Data Reviewed: I have personally reviewed following labs and imaging studies  CBC: Recent Labs  Lab 08/24/22 2208 08/25/22 0515 08/26/22 0522 08/30/22 1811 08/31/22 0430  WBC 8.0 7.3 7.6 7.8 6.4  NEUTROABS 6.2 5.3  --   --   --   HGB 13.1 11.9* 12.4 12.8 11.8*  HCT 40.4 37.1 38.9 40.7 38.1  MCV 92.2 92.3 93.5 94.2 95.7  PLT 198 168 176 184 180   Basic Metabolic Panel: Recent Labs  Lab 08/25/22 0515 08/26/22 0522 08/26/22 0546 08/27/22 0416 08/28/22 0420 08/30/22 1811 08/31/22 0430  NA 140 139  --  138 139 139 141  K 3.1* 3.7  --  3.5 3.7 4.3 3.7  CL 99 98  --  99 100 96* 100  CO2 30 32  --  31 33* 33* 33*  GLUCOSE 85 132*  --  123* 119* 104* 104*  BUN 13 17  --  19 18 15 14   CREATININE 1.08* 1.24*  --  0.96 0.89 0.89 0.90  CALCIUM 8.4* 8.4*  --  8.4* 8.6* 8.7* 8.5*  MG 1.6*  --  2.0 1.9  --  1.8 1.8  PHOS  --   --   --   --   --   --  3.4   GFR: Estimated Creatinine Clearance: 67 mL/min (by C-G formula based on SCr of 0.9 mg/dL). Liver Function Tests: Recent Labs  Lab 08/24/22 2208 08/25/22 0515 08/30/22 1811 08/31/22 0430  AST 20 15 19 18   ALT 16 15 16 14   ALKPHOS 71 59 72 62  BILITOT 1.6* 1.5* 0.9 0.7  PROT 6.5 5.5* 6.3* 5.6*  ALBUMIN 3.3* 2.9* 3.3* 2.9*   No results for input(s): "LIPASE", "AMYLASE"  in the last 168 hours. No results for input(s): "AMMONIA" in the last 168 hours. Coagulation Profile: Recent Labs  Lab 08/24/22 2208 08/30/22 1811  INR 1.3* 1.2   Cardiac Enzymes: No results for input(s): "CKTOTAL", "CKMB", "CKMBINDEX", "TROPONINI" in the last 168 hours. BNP (last 3 results) No results for input(s): "PROBNP" in the last 8760 hours. HbA1C: No results for input(s): "HGBA1C" in the last 72 hours. CBG: Recent Labs  Lab 08/28/22 0742 08/28/22 1139  GLUCAP 95 115*   Lipid Profile: No results for input(s): "CHOL", "HDL", "LDLCALC", "TRIG", "CHOLHDL", "LDLDIRECT" in the last 72 hours.  Thyroid Function Tests: No results for input(s): "TSH", "T4TOTAL", "FREET4", "T3FREE", "THYROIDAB" in the last 72 hours. Anemia Panel: No results for input(s): "VITAMINB12", "FOLATE", "FERRITIN", "TIBC", "IRON", "RETICCTPCT" in the last 72 hours. Sepsis Labs: Recent Labs  Lab 08/25/22 0515  PROCALCITON <0.10    No results found for this or any previous visit (from the past 240 hour(s)).       Radiology Studies: CT HEAD WO CONTRAST  Result Date: 08/30/2022 CLINICAL DATA:  Multiple falls, head injury EXAM: CT HEAD WITHOUT CONTRAST CT CERVICAL SPINE WITHOUT CONTRAST TECHNIQUE: Multidetector CT imaging of the head and cervical spine was performed following the standard protocol without intravenous contrast. Multiplanar CT image reconstructions of the cervical spine were also generated. RADIATION DOSE REDUCTION: This exam was performed according to the departmental dose-optimization program which includes automated exposure control, adjustment of the mA and/or kV according to patient size and/or use of iterative reconstruction technique. COMPARISON:  08/10/2022 FINDINGS: CT HEAD FINDINGS Brain: No evidence of acute infarction, hemorrhage, hydrocephalus, extra-axial collection or mass lesion/mass effect. Periventricular and deep white matter hypodensity. Vascular: No hyperdense vessel or  unexpected calcification. Skull: Normal. Negative for fracture or focal lesion. Sinuses/Orbits: No acute finding. Other: None. CT CERVICAL SPINE FINDINGS Alignment: Normal. Skull base and vertebrae: No acute fracture. No primary bone lesion or focal pathologic process. Soft tissues and spinal canal: No prevertebral fluid or swelling. No visible canal hematoma. Disc levels: Mild multilevel disc space height loss and osteophytosis. Upper chest: Negative. Other: None. IMPRESSION: 1. No acute intracranial pathology. Small-vessel white matter disease. 2. No fracture or static subluxation of the cervical spine. Electronically Signed   By: Jearld Lesch M.D.   On: 08/30/2022 18:57   CT CERVICAL SPINE WO CONTRAST  Result Date: 08/30/2022 CLINICAL DATA:  Multiple falls, head injury EXAM: CT HEAD WITHOUT CONTRAST CT CERVICAL SPINE WITHOUT CONTRAST TECHNIQUE: Multidetector CT imaging of the head and cervical spine was performed following the standard protocol without intravenous contrast. Multiplanar CT image reconstructions of the cervical spine were also generated. RADIATION DOSE REDUCTION: This exam was performed according to the departmental dose-optimization program which includes automated exposure control, adjustment of the mA and/or kV according to patient size and/or use of iterative reconstruction technique. COMPARISON:  08/10/2022 FINDINGS: CT HEAD FINDINGS Brain: No evidence of acute infarction, hemorrhage, hydrocephalus, extra-axial collection or mass lesion/mass effect. Periventricular and deep white matter hypodensity. Vascular: No hyperdense vessel or unexpected calcification. Skull: Normal. Negative for fracture or focal lesion. Sinuses/Orbits: No acute finding. Other: None. CT CERVICAL SPINE FINDINGS Alignment: Normal. Skull base and vertebrae: No acute fracture. No primary bone lesion or focal pathologic process. Soft tissues and spinal canal: No prevertebral fluid or swelling. No visible canal hematoma.  Disc levels: Mild multilevel disc space height loss and osteophytosis. Upper chest: Negative. Other: None. IMPRESSION: 1. No acute intracranial pathology. Small-vessel white matter disease. 2. No fracture or static subluxation of the cervical spine. Electronically Signed   By: Jearld Lesch M.D.   On: 08/30/2022 18:57   DG Chest 1 View  Result Date: 08/30/2022 CLINICAL DATA:  Three falls in the last day. EXAM: CHEST  1 VIEW COMPARISON:  Radiograph 08/24/2022 FINDINGS: Cardiomegaly is stable. Unchanged mediastinal contours. Suspected left pleural effusion and basilar opacity, without significant interval change. There is likely a diminutive right pleural effusion. Slight improvement in vascular congestion. No new airspace disease. No acute osseous abnormality on this portable exam. IMPRESSION: 1. No acute findings. 2. Unchanged cardiomegaly and suspected pleural effusions,  left greater than right. Electronically Signed   By: Narda Rutherford M.D.   On: 08/30/2022 18:52   DG Pelvis 1-2 Views  Result Date: 08/30/2022 CLINICAL DATA:  Three falls in the past day. EXAM: PELVIS - 1-2 VIEW COMPARISON:  Pelvis radiograph in right hip CT 09/05/2021 FINDINGS: The cortical margins of the bony pelvis are intact. No fracture. Pubic symphysis and sacroiliac joints are congruent. Both femoral heads are well-seated in the respective acetabula. Bilateral hip osteoarthritis. IMPRESSION: No fracture of the pelvis. Electronically Signed   By: Narda Rutherford M.D.   On: 08/30/2022 18:50        Scheduled Meds:  amiodarone  200 mg Oral BID   apixaban  5 mg Oral BID   atorvastatin  40 mg Oral Daily   famotidine  20 mg Oral Daily   feeding supplement  237 mL Oral BID BM   midodrine  10 mg Oral TID WC   pantoprazole  40 mg Oral BID   pramipexole  0.5 mg Oral QHS   sertraline  200 mg Oral Daily   Continuous Infusions:  cefTRIAXone (ROCEPHIN)  IV       LOS: 1 day    Time spent: 35 minutes    Shakeira Rhee Hoover Brunette,  DO [facility]  If 7PM-7AM, please contact night-coverage [email or web address] 08/31/2022, 2:04 PM

## 2022-09-01 DIAGNOSIS — N3000 Acute cystitis without hematuria: Secondary | ICD-10-CM | POA: Diagnosis not present

## 2022-09-01 LAB — URINE CULTURE: Culture: >=100000 — AB

## 2022-09-01 MED ORDER — METOPROLOL TARTRATE 25 MG PO TABS
12.5000 mg | ORAL_TABLET | Freq: Two times a day (BID) | ORAL | Status: DC
  Administered 2022-09-01 – 2022-09-02 (×3): 12.5 mg via ORAL
  Filled 2022-09-01 (×3): qty 1

## 2022-09-01 NOTE — Evaluation (Signed)
Occupational Therapy Evaluation Patient Details Name: Allison Rollins MRN: [id number] DOB: 1950/08/22 Today's Date: 09/01/2022   History of Present Illness Allison Rollins is a 72 y.o. female with medical history significant of hypertension, hyperlipidemia, GERD, RLS, CHF, atrial fibrillation on Eliquis, COPD, chronic respiratory failure on with hypoxia on supplemental oxygen WPL at baseline who presents emergency department due to complaint of multiple falls at home.  She complained of dizziness which usually occur when she stands from sitting position, this is usually followed with generalized weakness in the legs which resulted in falling to the ground, she endorsed falling yesterday and on falling today, she hit the back of her head and denies loss of consciousness, bleeding, nausea, vomiting.  Patient also complained of burning sensation with urination and increased urinary frequency which started on Saturday (5/4) prior to being discharged from this hospital.  Patient was recently admitted from 4/20 to 5/4 due to atrial fibrillation with RVR.   Clinical Impression   Pt agreeable to OT evaluation. Pt is assisted only for IADL's at baseline but required min A for bed mobility and transfer to chair with RW. Pt required a slight assist to boost from EOB and used bed rails and an elevated bed to sit at EOB. Pt is generally weak and requested to sit during step pivot to chair. Pt has limited home support and is a fall risk. Pt will benefit from continued OT in the hospital and recommended venue below to increase strength, balance, and endurance for safe ADL's.        Recommendations for follow up therapy are one component of a multi-disciplinary discharge planning process, led by the attending physician.  Recommendations may be updated based on patient status, additional functional criteria and insurance authorization.   Assistance Recommended at Discharge Intermittent Supervision/Assistance  Patient can  return home with the following A little help with walking and/or transfers;A little help with bathing/dressing/bathroom;Assistance with cooking/housework;Assist for transportation;Help with stairs or ramp for entrance    Functional Status Assessment  Patient has had a recent decline in their functional status and demonstrates the ability to make significant improvements in function in a reasonable and predictable amount of time.  Equipment Recommendations  None recommended by OT           Precautions / Restrictions Precautions Precautions: Fall Restrictions Weight Bearing Restrictions: No      Mobility Bed Mobility Overal bed mobility: Needs Assistance Bed Mobility: Supine to Sit     Supine to sit: Min assist, HOB elevated     General bed mobility comments: Min A based on pt's need for rail and HOB elevation.    Transfers Overall transfer level: Needs assistance Equipment used: Rolling walker (2 wheels) Transfers: Sit to/from Stand, Bed to chair/wheelchair/BSC Sit to Stand: Min assist     Step pivot transfers: Min assist, Min guard     General transfer comment: Min A to boost from EOB; more min G assist to pivot to the chair with RW.      Balance Overall balance assessment: Needs assistance Sitting-balance support: Feet supported, No upper extremity supported Sitting balance-Leahy Scale: Good Sitting balance - Comments: seated at EOB   Standing balance support: Reliant on assistive device for balance, During functional activity, Bilateral upper extremity supported Standing balance-Leahy Scale: Fair Standing balance comment: fair using RW                           ADL either  performed or assessed with clinical judgement   ADL Overall ADL's : Needs assistance/impaired     Grooming: Set up;Sitting   Upper Body Bathing: Set up;Sitting   Lower Body Bathing: Supervison/ safety;Sitting/lateral leans   Upper Body Dressing : Set up;Sitting   Lower  Body Dressing: Set up;Sitting/lateral leans   Toilet Transfer: Minimal assistance;Rolling walker (2 wheels);Stand-pivot Statistician Details (indicate cue type and reason): Simulated via EOB to chair transfer with RW. Toileting- Clothing Manipulation and Hygiene: Min guard;Sitting/lateral lean       Functional mobility during ADLs: Minimal assistance;Rolling walker (2 wheels);Min guard       Vision Baseline Vision/History: 1 Wears glasses;4 Cataracts Ability to See in Adequate Light: 1 Impaired Patient Visual Report: No change from baseline Vision Assessment?: No apparent visual deficits Additional Comments: Other than baseline.                Pertinent Vitals/Pain Pain Assessment Pain Assessment: No/denies pain     Hand Dominance Right   Extremity/Trunk Assessment Upper Extremity Assessment Upper Extremity Assessment: Generalized weakness   Lower Extremity Assessment Lower Extremity Assessment: Defer to PT evaluation   Cervical / Trunk Assessment Cervical / Trunk Assessment: Kyphotic   Communication Communication Communication: No difficulties   Cognition Arousal/Alertness: Awake/alert Behavior During Therapy: WFL for tasks assessed/performed Overall Cognitive Status: Within Functional Limits for tasks assessed                                                        Home Living Family/patient expects to be discharged to:: Private residence Living Arrangements: Alone Available Help at Discharge: Family Type of Home: House Home Access: Ramped entrance     Home Layout: One level     Bathroom Shower/Tub: Producer, television/film/video: Standard Bathroom Accessibility: Yes   Home Equipment: Agricultural consultant (2 wheels);Cane - single point;BSC/3in1;Shower seat   Additional Comments: per PT note.      Prior Functioning/Environment Prior Level of Function : Needs assist       Physical Assist : ADLs (physical)   ADLs  (physical): IADLs Mobility Comments: household ambulator using RW ADLs Comments: Independent ADL; assist for IADL's.        OT Problem List: Decreased strength;Decreased activity tolerance;Impaired balance (sitting and/or standing)      OT Treatment/Interventions: Self-care/ADL training;Therapeutic exercise;Therapeutic activities;Patient/family education;Balance training    OT Goals(Current goals can be found in the care plan section) Acute Rehab OT Goals Patient Stated Goal: return home OT Goal Formulation: With patient Time For Goal Achievement: 09/15/22 Potential to Achieve Goals: Good  OT Frequency: Min 1X/week                                   End of Session Equipment Utilized During Treatment: Rolling walker (2 wheels);Oxygen Nurse Communication: Mobility status  Activity Tolerance: Patient tolerated treatment well Patient left: in chair;with call bell/phone within reach  OT Visit Diagnosis: Unsteadiness on feet (R26.81);Other abnormalities of gait and mobility (R26.89);Muscle weakness (generalized) (M62.81);Dizziness and giddiness (R42)                Time: 4401-0272 OT Time Calculation (min): 16 min Charges:  OT General Charges $OT Visit: 1 Visit OT Evaluation $OT Eval Low Complexity: 1 Low  Randee Huston OT, MOT  Danie Chandler 09/01/2022, 9:45 AM

## 2022-09-01 NOTE — Progress Notes (Signed)
[facility] PROGRESS NOTE  Allison Rollins (DOB: Sep 28, 1950) ZOX:[id number] PCP: Gabriel Earing, FNP  Brief Narrative: Allison Rollins is a 72 y.o. female with a history of chronic hypoxic respiratory failure, COPD, AFib, HFrEF, HTN, HLD, RLS, GERD who presented to the ED on 08/30/2022 after multiple falls at home. She has history of orthostatic hypotension and weakness for which SNF was recommended last admission (4/30-5/4). She opted to go home, has had recurrent falling spells and returned. She was found to have a Citrobacter UTI treated with ceftriaxone and will transition to oral agent based on culture susceptibility data on discharge to [facility] 5/9.   Subjective: Feeling better, no new complaints.   Objective: BP 104/87 (BP Location: Right Arm)   Pulse (!) 109   Temp 98.4 F (36.9 C) (Oral)   Resp 14   Ht 5\' 6"  (1.676 m)   Wt 95.9 kg   SpO2 100%   BMI 34.12 kg/m   Gen: No distress Pulm: Clear, nonlabored  CV: Irreg irreg, tachycardic rate, no edema GI: Soft, NT, ND, +BS  Neuro: Alert and oriented. No new focal deficits. Ext: Warm, no deformities.  Skin: Ecchymosis superior to occiput, right hand ecchymosis stable per report. No new rashes, lesions or ulcers on visualized skin   Assessment & Plan: Principal Problem:   UTI (urinary tract infection) Active Problems:   Mixed hyperlipidemia   Chronic atrial fibrillation with RVR (HCC)   Essential hypertension   GERD without esophagitis   Recurrent depression (HCC)   Restless leg syndrome   Chronic obstructive pulmonary disease (HCC)   Chronic respiratory failure with hypoxia (HCC)   Orthostatic hypotension   Recurrent falls   Hypoalbuminemia due to protein-calorie malnutrition (HCC)   Chronic combined systolic and diastolic CHF (congestive heart failure) (HCC)  UTI: Culture with Citrobacter. She is afebrile with normal WBC with no ongoing urinary symptoms. Though susceptibility to CTX is only 74% in this area, will not  plan to change abx unless resistant data (anticipate this by 5/9). Could switch to cefepime if showing any clinical deterioration and discharge on cipro (95% susceptibility) if no culture data available at discharge.   Recurrent falls:  - Continue PT at SNF  AFib with RVR: Rate rising.  - Restart home metoprolol. Continue amiodarone and eliquis. Monitor for worsening orthostatic hypotension. Continue midodrine.   Chronic combined HFrEF: Echo March 2024 w/global hypokinesis, LVEF 40-45%, mild concentric LVH, indeterminate diastolic parameters.  - Restart home medications including lasix once BP shown to be stable after restarting metoprolol.   COPD, chronic hypoxic respiratory failure: At baseline.  - Continue home O2  GERD:  - Continue acid suppression  HLD:  - Continue statin  RLS:  - Continue pramipexole.   Obesity: Body mass index is 34.12 kg/m.   Depression - Continue SSRI  Hypoalbuminemia, mild protein calorie malnutrition:  - Supplement  Tyrone Nine, MD [facility] [email or web address] 09/01/2022, 6:44 PM

## 2022-09-01 NOTE — Progress Notes (Signed)
Mobility Specialist Progress Note:    09/01/22 1104  Mobility  Activity Transferred from chair to bed  Level of Assistance Moderate assist, patient does 50-74%  Assistive Device Front wheel walker  Distance Ambulated (ft) 3 ft  Activity Response Tolerated well  Mobility Referral Yes  $Mobility charge 1 Mobility  Mobility Specialist Start Time (ACUTE ONLY) 1050  Mobility Specialist Stop Time (ACUTE ONLY) 1105  Mobility Specialist Time Calculation (min) (ACUTE ONLY) 15 min   Pt requested assistance to transfer C>B. Tolerated transfer well, required ModA to transfer via RW. Pt c/o headache, nausea, and fatigue. Left pt in bed with call bell in reach, all needs met.   Feliciana Rossetti Mobility Specialist Please contact via Special educational needs teacher or  Rehab office at [phone]

## 2022-09-01 NOTE — Plan of Care (Signed)
  Problem: Acute Rehab OT Goals (only OT should resolve) Goal: Pt. Will Perform Grooming Flowsheets (Taken 09/01/2022 0948) Pt Will Perform Grooming:  with modified independence  standing Goal: Pt. Will Perform Upper Body Dressing Flowsheets (Taken 09/01/2022 0948) Pt Will Perform Upper Body Dressing:  with modified independence  sitting Goal: Pt. Will Perform Lower Body Dressing Flowsheets (Taken 09/01/2022 0948) Pt Will Perform Lower Body Dressing:  with modified independence  sitting/lateral leans Goal: Pt. Will Transfer To Toilet Flowsheets (Taken 09/01/2022 [phone]) Pt Will Transfer to Toilet:  with modified independence  ambulating Goal: Pt/Caregiver Will Perform Home Exercise Program Flowsheets (Taken 09/01/2022 [phone]) Pt/caregiver will Perform Home Exercise Program:  Increased strength  Both right and left upper extremity  Independently  Trust Crago OT, MOT

## 2022-09-01 NOTE — TOC Progression Note (Signed)
Transition of Care [facility]) - Progression Note    Patient Details  Name: Allison Rollins MRN: [id number] Date of Birth: 02-16-1951  Transition of Care [facility]) CM/SW Contact  Villa Herb, [place] Phone Number: 09/01/2022, 9:57 AM  Clinical Narrative:    CSW confirmed pts insurance auth has been approved for SNF at [facility]. CSW updated Destiny with the facility of plan for D/C tmrw. TOC to follow.   Expected Discharge Plan: Skilled Nursing Facility Barriers to Discharge: Continued Medical Work up  Expected Discharge Plan and Services In-house Referral: Clinical Social Work Discharge Planning Services: CM Consult Post Acute Care Choice: Skilled Nursing Facility Living arrangements for the past 2 months: Single Family Home                                       Social Determinants of Health (SDOH) Interventions SDOH Screenings   Food Insecurity: Food Insecurity Present (08/31/2022)  Housing: Low Risk  (08/31/2022)  Transportation Needs: No Transportation Needs (08/31/2022)  Utilities: Not At Risk (08/31/2022)  Alcohol Screen: Low Risk  (04/16/2022)  Depression (PHQ2-9): Low Risk  (04/16/2022)  Recent Concern: Depression (PHQ2-9) - High Risk (02/24/2022)  Financial Resource Strain: Medium Risk (04/16/2022)  Physical Activity: Insufficiently Active (04/16/2022)  Social Connections: Moderately Integrated (04/16/2022)  Recent Concern: Social Connections - Socially Isolated (03/24/2022)  Stress: No Stress Concern Present (04/16/2022)  Tobacco Use: Medium Risk (08/31/2022)    Readmission Risk Interventions    08/31/2022   11:14 AM 08/31/2022   11:03 AM 08/25/2022   12:32 PM  Readmission Risk Prevention Plan  Transportation Screening Complete Complete Complete  HRI or Home Care Consult Complete Complete Complete  Social Work Consult for Recovery Care Planning/Counseling Complete Complete Complete  Palliative Care Screening Not Applicable Not Applicable Not Applicable  Medication Review Special educational needs teacher) Complete Complete Complete

## 2022-09-02 ENCOUNTER — Telehealth (HOSPITAL_COMMUNITY): Payer: Self-pay | Admitting: Vascular Surgery

## 2022-09-02 ENCOUNTER — Encounter (HOSPITAL_COMMUNITY): Payer: Self-pay | Admitting: Internal Medicine

## 2022-09-02 DIAGNOSIS — N3000 Acute cystitis without hematuria: Secondary | ICD-10-CM | POA: Diagnosis not present

## 2022-09-02 DIAGNOSIS — F419 Anxiety disorder, unspecified: Secondary | ICD-10-CM | POA: Diagnosis not present

## 2022-09-02 DIAGNOSIS — J4489 Other specified chronic obstructive pulmonary disease: Secondary | ICD-10-CM | POA: Diagnosis not present

## 2022-09-02 DIAGNOSIS — I4819 Other persistent atrial fibrillation: Secondary | ICD-10-CM | POA: Diagnosis not present

## 2022-09-02 DIAGNOSIS — F32A Depression, unspecified: Secondary | ICD-10-CM | POA: Diagnosis not present

## 2022-09-02 DIAGNOSIS — F4323 Adjustment disorder with mixed anxiety and depressed mood: Secondary | ICD-10-CM | POA: Diagnosis not present

## 2022-09-02 DIAGNOSIS — Z79899 Other long term (current) drug therapy: Secondary | ICD-10-CM | POA: Diagnosis not present

## 2022-09-02 DIAGNOSIS — I10 Essential (primary) hypertension: Secondary | ICD-10-CM | POA: Diagnosis not present

## 2022-09-02 DIAGNOSIS — M6281 Muscle weakness (generalized): Secondary | ICD-10-CM | POA: Diagnosis not present

## 2022-09-02 DIAGNOSIS — I4891 Unspecified atrial fibrillation: Secondary | ICD-10-CM | POA: Diagnosis not present

## 2022-09-02 DIAGNOSIS — I5032 Chronic diastolic (congestive) heart failure: Secondary | ICD-10-CM | POA: Diagnosis not present

## 2022-09-02 DIAGNOSIS — R2689 Other abnormalities of gait and mobility: Secondary | ICD-10-CM | POA: Diagnosis not present

## 2022-09-02 DIAGNOSIS — R41841 Cognitive communication deficit: Secondary | ICD-10-CM | POA: Diagnosis not present

## 2022-09-02 DIAGNOSIS — E119 Type 2 diabetes mellitus without complications: Secondary | ICD-10-CM | POA: Diagnosis not present

## 2022-09-02 DIAGNOSIS — I482 Chronic atrial fibrillation, unspecified: Secondary | ICD-10-CM | POA: Diagnosis not present

## 2022-09-02 DIAGNOSIS — R600 Localized edema: Secondary | ICD-10-CM | POA: Diagnosis not present

## 2022-09-02 DIAGNOSIS — J449 Chronic obstructive pulmonary disease, unspecified: Secondary | ICD-10-CM | POA: Diagnosis not present

## 2022-09-02 DIAGNOSIS — J9611 Chronic respiratory failure with hypoxia: Secondary | ICD-10-CM | POA: Diagnosis not present

## 2022-09-02 DIAGNOSIS — G20A1 Parkinson's disease without dyskinesia, without mention of fluctuations: Secondary | ICD-10-CM | POA: Diagnosis not present

## 2022-09-02 DIAGNOSIS — I509 Heart failure, unspecified: Secondary | ICD-10-CM | POA: Diagnosis not present

## 2022-09-02 DIAGNOSIS — I5042 Chronic combined systolic (congestive) and diastolic (congestive) heart failure: Secondary | ICD-10-CM | POA: Diagnosis not present

## 2022-09-02 DIAGNOSIS — E785 Hyperlipidemia, unspecified: Secondary | ICD-10-CM | POA: Diagnosis not present

## 2022-09-02 DIAGNOSIS — I110 Hypertensive heart disease with heart failure: Secondary | ICD-10-CM | POA: Diagnosis not present

## 2022-09-02 DIAGNOSIS — K220 Achalasia of cardia: Secondary | ICD-10-CM | POA: Diagnosis not present

## 2022-09-02 DIAGNOSIS — I5043 Acute on chronic combined systolic (congestive) and diastolic (congestive) heart failure: Secondary | ICD-10-CM | POA: Diagnosis not present

## 2022-09-02 DIAGNOSIS — Z9181 History of falling: Secondary | ICD-10-CM | POA: Diagnosis not present

## 2022-09-02 DIAGNOSIS — I951 Orthostatic hypotension: Secondary | ICD-10-CM | POA: Diagnosis not present

## 2022-09-02 DIAGNOSIS — I959 Hypotension, unspecified: Secondary | ICD-10-CM | POA: Diagnosis not present

## 2022-09-02 DIAGNOSIS — Z9981 Dependence on supplemental oxygen: Secondary | ICD-10-CM | POA: Diagnosis not present

## 2022-09-02 DIAGNOSIS — J90 Pleural effusion, not elsewhere classified: Secondary | ICD-10-CM | POA: Diagnosis not present

## 2022-09-02 DIAGNOSIS — N390 Urinary tract infection, site not specified: Secondary | ICD-10-CM | POA: Diagnosis not present

## 2022-09-02 LAB — URINE CULTURE

## 2022-09-02 MED ORDER — POLYETHYLENE GLYCOL 3350 17 G PO PACK
17.0000 g | PACK | Freq: Once | ORAL | Status: DC
  Filled 2022-09-02: qty 1

## 2022-09-02 MED ORDER — BISACODYL 10 MG RE SUPP
10.0000 mg | Freq: Once | RECTAL | Status: DC
  Filled 2022-09-02: qty 1

## 2022-09-02 NOTE — Progress Notes (Signed)
Discharged to [facility] via [facility].

## 2022-09-02 NOTE — Telephone Encounter (Signed)
Pt is is still admitted @ [facility] w/ try back after discharge

## 2022-09-02 NOTE — Discharge Summary (Signed)
Physician Discharge Summary   Patient: Allison Rollins MRN: [id number] DOB: 1950/12/29  Admit date:     08/30/2022  Discharge date: 09/02/22  Discharge Physician: Tyrone Nine   PCP: Gabriel Earing, FNP   Recommendations at discharge:  Follow up with PCP 1-2 weeks after SNF discharge.  Discharge Diagnoses: Principal Problem:   UTI (urinary tract infection) Active Problems:   Mixed hyperlipidemia   Chronic atrial fibrillation with RVR (HCC)   Essential hypertension   GERD without esophagitis   Recurrent depression (HCC)   Restless leg syndrome   Chronic obstructive pulmonary disease (HCC)   Chronic respiratory failure with hypoxia (HCC)   Orthostatic hypotension   Recurrent falls   Hypoalbuminemia due to protein-calorie malnutrition (HCC)   Chronic combined systolic and diastolic CHF (congestive heart failure) [facility])  Hospital Course: Allison Rollins is a 72 y.o. female with a history of chronic hypoxic respiratory failure, COPD, AFib, HFrEF, HTN, HLD, RLS, GERD who presented to the ED on 08/30/2022 after multiple falls at home. She has history of orthostatic hypotension and weakness for which SNF was recommended last admission (4/30-5/4). She opted to go home, has had recurrent falling spells and returned. She was found to have a Citrobacter UTI treated with ceftriaxone based on culture susceptibility data. She will require PT at SNF at discharge.  Assessment and Plan: UTI: Culture with Citrobacter freundii pansensitive. She is afebrile with normal WBC with no ongoing urinary symptoms, has completed 3 days IV ceftriaxone, proven to be effective against this bacteria, has completed treatment while admitted.    Recurrent falls:  - Continue PT at SNF   AFib with RVR: Rate rising.  - Restart home metoprolol. Continue amiodarone and eliquis. Recommend monitoring for worsening orthostatic hypotension. Continue midodrine.    Chronic combined HFrEF: Echo March 2024 w/global hypokinesis,  LVEF 40-45%, mild concentric LVH, indeterminate diastolic parameters.  - Restart home medications including lasix since BP shown to be stable after restarting metoprolol.    COPD, chronic hypoxic respiratory failure: At baseline.  - Continue home O2   GERD:  - Continue acid suppression   HLD:  - Continue statin   RLS:  - Continue pramipexole.    Obesity: Body mass index is 34.12 kg/m.    Depression - Continue SSRI   Hypoalbuminemia, mild protein calorie malnutrition:  - Supplement  Consultants: None Procedures performed: None  Disposition: Skilled nursing facility Diet recommendation:  Cardiac and Carb modified diet DISCHARGE MEDICATION: Allergies as of 09/02/2022       Reactions   Egg Solids, Whole    Farxiga [dapagliflozin] Swelling   Penicillins Itching, Rash   Has patient had a PCN reaction causing immediate rash, facial/tongue/throat swelling, SOB or lightheadedness with hypotension: no Has patient had a PCN reaction causing severe rash involving mucus membranes or skin necrosis: no Has patient had a PCN reaction that required hospitalization: no Has patient had a PCN reaction occurring within the last 10 years: no If all of the above answers are "NO", then may proceed with Cephalosporin use.   Sulfa Antibiotics Rash        Medication List     TAKE these medications    Accu-Chek Aviva Plus test strip Generic drug: glucose blood Test sugars daily   acetaminophen 325 MG tablet Commonly known as: TYLENOL Take 2 tablets (650 mg total) by mouth every 6 (six) hours as needed for mild pain (or Fever >/= 101).   albuterol (2.5 MG/3ML) 0.083% nebulizer solution Commonly  known as: PROVENTIL Take 3 mLs (2.5 mg total) by nebulization every 6 (six) hours as needed for wheezing or shortness of breath.   albuterol 108 (90 Base) MCG/ACT inhaler Commonly known as: VENTOLIN HFA Inhale 2 puffs into the lungs every 6 (six) hours as needed for wheezing or shortness of  breath.   alum & mag hydroxide-simeth 200-200-20 MG/5ML suspension Commonly known as: MAALOX/MYLANTA Take 30 mLs by mouth every 6 (six) hours as needed for indigestion or heartburn.   amiodarone 200 MG tablet Commonly known as: PACERONE Take 1 tablet (200 mg total) by mouth daily. Take amiodarone  200mg  twice daily for  3 weeks (thru 09/18/22),  then 200mg  once daily starting Monday 09/19/22   apixaban 5 MG Tabs tablet Commonly known as: ELIQUIS Take 1 tablet (5 mg total) by mouth 2 (two) times daily.   atorvastatin 40 MG tablet Commonly known as: LIPITOR Take 1 tablet (40 mg total) by mouth daily.   CALCIUM PO Take 1 tablet by mouth daily.   cholecalciferol 25 MCG (1000 UNIT) tablet Commonly known as: VITAMIN D3 Take 1,000 Units by mouth daily.   famotidine 20 MG tablet Commonly known as: PEPCID Take 20 mg by mouth daily.   fluticasone 50 MCG/ACT nasal spray Commonly known as: FLONASE Use 2 sprays into each nostril every day What changed: See the new instructions.   furosemide 20 MG tablet Commonly known as: Lasix Take 1 tablet (20 mg total) by mouth daily. What changed:  when to take this reasons to take this   gabapentin 300 MG capsule Commonly known as: NEURONTIN Take 600 mg by mouth 3 (three) times daily.   metoprolol tartrate 25 MG tablet Commonly known as: LOPRESSOR Take 0.5 tablets (12.5 mg total) by mouth 2 (two) times daily.   midodrine 10 MG tablet Commonly known as: PROAMATINE Take 1 tablet (10 mg total) by mouth 3 (three) times daily with meals. For Low Blood Pressure   nitroGLYCERIN 0.4 MG SL tablet Commonly known as: NITROSTAT Place 1 tablet (0.4 mg total) under the tongue every 5 (five) minutes as needed for chest pain.   pantoprazole 40 MG tablet Commonly known as: PROTONIX Take 1 tablet (40 mg total) by mouth 2 (two) times daily.   pramipexole 0.5 MG tablet Commonly known as: MIRAPEX Take 1 tablet (0.5 mg total) by mouth at bedtime.    sertraline 100 MG tablet Commonly known as: ZOLOFT Take 2 tablets (200 mg total) by mouth daily.   VITAMIN C PO Take 1 tablet by mouth 3 (three) times daily.        Contact information for follow-up providers     Gabriel Earing, FNP Follow up.   Specialty: Family Medicine Contact information: [address] [place] [address] [phone]              Contact information for after-discharge care     Destination     [facility] Preferred SNF .   Service: Skilled Nursing Contact information: [address] [place] [address] [phone]                    Discharge Exam: Filed Weights   08/31/22 0113 09/01/22 0504 09/02/22 0500  Weight: 98.9 kg 95.9 kg 96.3 kg  BP 104/75 (BP Location: Right Arm)   Pulse 92   Temp 97.8 F (36.6 C)   Resp 17   Ht 5\' 6"  (1.676 m)   Wt 96.3 kg  SpO2 97%   BMI 34.27 kg/m   Well-appearing female in good spirits Clear, nonlabored No focal deficits  Condition at discharge: stable  The results of significant diagnostics from this hospitalization (including imaging, microbiology, ancillary and laboratory) are listed below for reference.   Imaging Studies: CT HEAD WO CONTRAST  Result Date: 08/30/2022 CLINICAL DATA:  Multiple falls, head injury EXAM: CT HEAD WITHOUT CONTRAST CT CERVICAL SPINE WITHOUT CONTRAST TECHNIQUE: Multidetector CT imaging of the head and cervical spine was performed following the standard protocol without intravenous contrast. Multiplanar CT image reconstructions of the cervical spine were also generated. RADIATION DOSE REDUCTION: This exam was performed according to the departmental dose-optimization program which includes automated exposure control, adjustment of the mA and/or kV according to patient size and/or use of iterative reconstruction technique. COMPARISON:  08/10/2022 FINDINGS: CT HEAD FINDINGS Brain: No evidence of acute infarction, hemorrhage,  hydrocephalus, extra-axial collection or mass lesion/mass effect. Periventricular and deep white matter hypodensity. Vascular: No hyperdense vessel or unexpected calcification. Skull: Normal. Negative for fracture or focal lesion. Sinuses/Orbits: No acute finding. Other: None. CT CERVICAL SPINE FINDINGS Alignment: Normal. Skull base and vertebrae: No acute fracture. No primary bone lesion or focal pathologic process. Soft tissues and spinal canal: No prevertebral fluid or swelling. No visible canal hematoma. Disc levels: Mild multilevel disc space height loss and osteophytosis. Upper chest: Negative. Other: None. IMPRESSION: 1. No acute intracranial pathology. Small-vessel white matter disease. 2. No fracture or static subluxation of the cervical spine. Electronically Signed   By: Jearld Lesch M.D.   On: 08/30/2022 18:57   CT CERVICAL SPINE WO CONTRAST  Result Date: 08/30/2022 CLINICAL DATA:  Multiple falls, head injury EXAM: CT HEAD WITHOUT CONTRAST CT CERVICAL SPINE WITHOUT CONTRAST TECHNIQUE: Multidetector CT imaging of the head and cervical spine was performed following the standard protocol without intravenous contrast. Multiplanar CT image reconstructions of the cervical spine were also generated. RADIATION DOSE REDUCTION: This exam was performed according to the departmental dose-optimization program which includes automated exposure control, adjustment of the mA and/or kV according to patient size and/or use of iterative reconstruction technique. COMPARISON:  08/10/2022 FINDINGS: CT HEAD FINDINGS Brain: No evidence of acute infarction, hemorrhage, hydrocephalus, extra-axial collection or mass lesion/mass effect. Periventricular and deep white matter hypodensity. Vascular: No hyperdense vessel or unexpected calcification. Skull: Normal. Negative for fracture or focal lesion. Sinuses/Orbits: No acute finding. Other: None. CT CERVICAL SPINE FINDINGS Alignment: Normal. Skull base and vertebrae: No acute  fracture. No primary bone lesion or focal pathologic process. Soft tissues and spinal canal: No prevertebral fluid or swelling. No visible canal hematoma. Disc levels: Mild multilevel disc space height loss and osteophytosis. Upper chest: Negative. Other: None. IMPRESSION: 1. No acute intracranial pathology. Small-vessel white matter disease. 2. No fracture or static subluxation of the cervical spine. Electronically Signed   By: Jearld Lesch M.D.   On: 08/30/2022 18:57   DG Chest 1 View  Result Date: 08/30/2022 CLINICAL DATA:  Three falls in the last day. EXAM: CHEST  1 VIEW COMPARISON:  Radiograph 08/24/2022 FINDINGS: Cardiomegaly is stable. Unchanged mediastinal contours. Suspected left pleural effusion and basilar opacity, without significant interval change. There is likely a diminutive right pleural effusion. Slight improvement in vascular congestion. No new airspace disease. No acute osseous abnormality on this portable exam. IMPRESSION: 1. No acute findings. 2. Unchanged cardiomegaly and suspected pleural effusions, left greater than right. Electronically Signed   By: Narda Rutherford M.D.   On: 08/30/2022 18:52   DG Pelvis  1-2 Views  Result Date: 08/30/2022 CLINICAL DATA:  Three falls in the past day. EXAM: PELVIS - 1-2 VIEW COMPARISON:  Pelvis radiograph in right hip CT 09/05/2021 FINDINGS: The cortical margins of the bony pelvis are intact. No fracture. Pubic symphysis and sacroiliac joints are congruent. Both femoral heads are well-seated in the respective acetabula. Bilateral hip osteoarthritis. IMPRESSION: No fracture of the pelvis. Electronically Signed   By: Narda Rutherford M.D.   On: 08/30/2022 18:50   DG Chest Port 1 View  Result Date: 08/24/2022 CLINICAL DATA:  Shortness of breath and chest pain EXAM: PORTABLE CHEST 1 VIEW COMPARISON:  08/10/2022 FINDINGS: Cardiac shadow is enlarged but stable. Aortic calcifications are again seen. Chronic blunting of the left costophrenic angle is  noted. Increasing left retrocardiac airspace opacity is noted consistent with early infiltrate. No bony abnormality is noted. IMPRESSION: Increasing left retrocardiac infiltrate. Electronically Signed   By: Alcide Clever M.D.   On: 08/24/2022 23:00   CT Head Wo Contrast  Result Date: 08/10/2022 CLINICAL DATA:  Fall EXAM: CT HEAD WITHOUT CONTRAST CT CERVICAL SPINE WITHOUT CONTRAST TECHNIQUE: Multidetector CT imaging of the head and cervical spine was performed following the standard protocol without intravenous contrast. Multiplanar CT image reconstructions of the cervical spine were also generated. RADIATION DOSE REDUCTION: This exam was performed according to the departmental dose-optimization program which includes automated exposure control, adjustment of the mA and/or kV according to patient size and/or use of iterative reconstruction technique. COMPARISON:  CT head and cervical spine 11/22/2019 FINDINGS: CT HEAD FINDINGS Brain: There is no acute intracranial hemorrhage, extra-axial fluid collection, or acute infarct. Parenchymal volume is normal for age. The ventricles are normal in size. Gray-white differentiation is preserved. There is extensive confluent hypodensity throughout the supratentorial white matter likely reflecting sequela of advanced chronic small-vessel ischemic change. 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 The pituitary and suprasellar region are normal. There is no mass lesion. There is no mass effect or midline shift. Vascular: There is calcification of the bilateral carotid siphons. Skull: Normal. Negative for fracture or focal lesion. Sinuses/Orbits: There is mild mucosal thickening in the left maxillary sinus. The globes and orbits are unremarkable. Other: None. CT CERVICAL SPINE FINDINGS Alignment: Normal. Skull base and vertebrae: Skull base alignment is maintained. Vertebral body heights are preserved. There is no evidence of acute  fracture. There is no suspicious osseous lesion. Soft tissues and spinal canal: No prevertebral fluid or swelling. No visible canal hematoma. Disc levels: There is overall mild degenerative change of the cervical spine without evidence of high-grade osseous spinal canal or neural foraminal stenosis. Upper chest: The imaged portions of the lung apices are clear. Other: None. IMPRESSION: 1. No acute intracranial pathology. 2. No acute fracture or traumatic malalignment of the cervical spine. Electronically Signed   By: Lesia Hausen M.D.   On: 08/10/2022 15:04   CT Cervical Spine Wo Contrast  Result Date: 08/10/2022 CLINICAL DATA:  Fall EXAM: CT HEAD WITHOUT CONTRAST CT CERVICAL SPINE WITHOUT CONTRAST TECHNIQUE: Multidetector CT imaging of the head and cervical spine was performed following the standard protocol without intravenous contrast. Multiplanar CT image reconstructions of the cervical spine were also generated. RADIATION DOSE REDUCTION: This exam was performed according to the departmental dose-optimization program which includes automated exposure control, adjustment of the mA and/or kV according to patient size and/or use of iterative reconstruction technique. COMPARISON:  CT head and cervical spine 11/22/2019 FINDINGS: CT HEAD FINDINGS Brain: There  is no acute intracranial hemorrhage, extra-axial fluid collection, or acute infarct. Parenchymal volume is normal for age. The ventricles are normal in size. Gray-white differentiation is preserved. There is extensive confluent hypodensity throughout the supratentorial white matter likely reflecting sequela of advanced chronic small-vessel ischemic change. 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 The pituitary and suprasellar region are normal. There is no mass lesion. There is no mass effect or midline shift. Vascular: There is calcification of the bilateral carotid siphons. Skull: Normal. Negative for fracture or  focal lesion. Sinuses/Orbits: There is mild mucosal thickening in the left maxillary sinus. The globes and orbits are unremarkable. Other: None. CT CERVICAL SPINE FINDINGS Alignment: Normal. Skull base and vertebrae: Skull base alignment is maintained. Vertebral body heights are preserved. There is no evidence of acute fracture. There is no suspicious osseous lesion. Soft tissues and spinal canal: No prevertebral fluid or swelling. No visible canal hematoma. Disc levels: There is overall mild degenerative change of the cervical spine without evidence of high-grade osseous spinal canal or neural foraminal stenosis. Upper chest: The imaged portions of the lung apices are clear. Other: None. IMPRESSION: 1. No acute intracranial pathology. 2. No acute fracture or traumatic malalignment of the cervical spine. Electronically Signed   By: Lesia Hausen M.D.   On: 08/10/2022 15:04   DG Chest 2 View  Result Date: 08/10/2022 CLINICAL DATA:  Shortness of breath. EXAM: CHEST - 2 VIEW COMPARISON:  X-ray 07/10/2022 FINDINGS: Enlarged cardiopericardial silhouette with calcified aorta. Vascular congestion. Hyperinflation. No pneumothorax or effusion. Frontal view is rotated to the left. Lateral view is under penetrated with motion. Degenerative changes are seen of the spine. Overlapping cardiac leads. IMPRESSION: Limited x-rays. Enlarged heart with some vascular congestion. Electronically Signed   By: Karen Kays M.D.   On: 08/10/2022 14:57    Microbiology: Results for orders placed or performed during the hospital encounter of 08/30/22  Urine Culture     Status: Abnormal   Collection Time: 08/30/22  9:11 PM   Specimen: Urine, Clean Catch  Result Value Ref Range Status   Specimen Description   Final    URINE, CLEAN CATCH Performed at [facility], [address], [place], [place] [address]    Special Requests   Final    NONE Performed at [facility], [address] [place], [place] [address]    Culture >=100,000  COLONIES/mL CITROBACTER FREUNDII (A)  Final   Report Status 09/02/2022 FINAL  Final   Organism ID, Bacteria CITROBACTER FREUNDII (A)  Final      Susceptibility   Citrobacter freundii - MIC*    CEFEPIME <=0.12 SENSITIVE Sensitive     CEFTRIAXONE <=0.25 SENSITIVE Sensitive     CIPROFLOXACIN <=0.25 SENSITIVE Sensitive     GENTAMICIN <=1 SENSITIVE Sensitive     IMIPENEM <=0.25 SENSITIVE Sensitive     NITROFURANTOIN <=16 SENSITIVE Sensitive     TRIMETH/SULFA <=20 SENSITIVE Sensitive     PIP/TAZO <=4 SENSITIVE Sensitive     * >=100,000 COLONIES/mL CITROBACTER FREUNDII    Labs: CBC: Recent Labs  Lab 08/30/22 1811 08/31/22 0430  WBC 7.8 6.4  HGB 12.8 11.8*  HCT 40.7 38.1  MCV 94.2 95.7  PLT 184 180   Basic Metabolic Panel: Recent Labs  Lab 08/27/22 0416 08/28/22 0420 08/30/22 1811 08/31/22 0430  NA 138 139 139 141  K 3.5 3.7 4.3 3.7  CL 99 100 96* 100  CO2 31 33* 33* 33*  GLUCOSE 123* 119*  104* 104*  BUN 19 18 15 14   CREATININE 0.96 0.89 0.89 0.90  CALCIUM 8.4* 8.6* 8.7* 8.5*  MG 1.9  --  1.8 1.8  PHOS  --   --   --  3.4   Liver Function Tests: Recent Labs  Lab 08/30/22 1811 08/31/22 0430  AST 19 18  ALT 16 14  ALKPHOS 72 62  BILITOT 0.9 0.7  PROT 6.3* 5.6*  ALBUMIN 3.3* 2.9*   CBG: Recent Labs  Lab 08/28/22 0742 08/28/22 1139  GLUCAP 95 115*    Discharge time spent: greater than 30 minutes.  Signed: Tyrone Nine, MD [facility] 09/02/2022

## 2022-09-02 NOTE — TOC Transition Note (Signed)
Transition of Care [facility]) - CM/SW Discharge Note   Patient Details  Name: Allison Rollins MRN: [id number] Date of Birth: [birth year]  [facility]) CM/SW Contact:  Villa Herb, LCSWA Phone Number: 09/02/2022, 12:26 PM   Clinical Narrative:    CSW updated Destiny with [facility] that pt is medically ready for D/C. They are ready to accept pt. CSW updated pts son of plan for D/C today. CSW provided RN with numbers for room and report. CSW got rider waiver signed by pt and placed in chart. [facility] has been called for transportation.   Final next level of care: Skilled Nursing Facility Barriers to Discharge: Barriers Resolved   Patient Goals and CMS Choice CMS Medicare.gov Compare Post Acute Care list provided to:: Patient Choice offered to / list presented to : Patient  Discharge Placement                  Patient to be transferred to facility by: [place] Name of family member notified: Caroll Rancher Patient and family notified of of transfer: 09/02/22  Discharge Plan and Services Additional resources added to the After Visit Summary for   In-house Referral: Clinical Social Work Discharge Planning Services: CM Consult Post Acute Care Choice: Skilled Nursing Facility                               Social Determinants of Health (SDOH) Interventions SDOH Screenings   Food Insecurity: Food Insecurity Present (08/31/2022)  Housing: Low Risk  (08/31/2022)  Transportation Needs: No Transportation Needs (08/31/2022)  Utilities: Not At Risk (08/31/2022)  Alcohol Screen: Low Risk  (04/16/2022)  Depression (PHQ2-9): Low Risk  (04/16/2022)  Recent Concern: Depression (PHQ2-9) - High Risk (02/24/2022)  Financial Resource Strain: Medium Risk (04/16/2022)  Physical Activity: Insufficiently Active (04/16/2022)  Social Connections: Moderately Integrated (04/16/2022)  Recent Concern: Social Connections - Socially Isolated (03/24/2022)  Stress: No Stress Concern Present (04/16/2022)   Tobacco Use: Medium Risk (08/31/2022)     Readmission Risk Interventions    08/31/2022   11:14 AM 08/31/2022   11:03 AM 08/25/2022   12:32 PM  Readmission Risk Prevention Plan  Transportation Screening Complete Complete Complete  HRI or Home Care Consult Complete Complete Complete  Social Work Consult for Recovery Care Planning/Counseling Complete Complete Complete  Palliative Care Screening Not Applicable Not Applicable Not Applicable  Medication Review Oceanographer) Complete Complete Complete

## 2022-09-02 NOTE — Care Management Important Message (Signed)
Important Message  Patient Details  Name: Allison Rollins MRN: [id number] Date of Birth: 1951/01/24   Medicare Important Message Given:  Yes     Corey Harold 09/02/2022, 10:47 AM

## 2022-09-03 ENCOUNTER — Ambulatory Visit (INDEPENDENT_AMBULATORY_CARE_PROVIDER_SITE_OTHER): Payer: Medicare HMO

## 2022-09-03 DIAGNOSIS — F419 Anxiety disorder, unspecified: Secondary | ICD-10-CM | POA: Diagnosis not present

## 2022-09-03 DIAGNOSIS — N390 Urinary tract infection, site not specified: Secondary | ICD-10-CM

## 2022-09-03 DIAGNOSIS — I4819 Other persistent atrial fibrillation: Secondary | ICD-10-CM

## 2022-09-03 DIAGNOSIS — I959 Hypotension, unspecified: Secondary | ICD-10-CM | POA: Diagnosis not present

## 2022-09-03 DIAGNOSIS — F32A Depression, unspecified: Secondary | ICD-10-CM | POA: Diagnosis not present

## 2022-09-03 DIAGNOSIS — I110 Hypertensive heart disease with heart failure: Secondary | ICD-10-CM | POA: Diagnosis not present

## 2022-09-03 DIAGNOSIS — J9611 Chronic respiratory failure with hypoxia: Secondary | ICD-10-CM

## 2022-09-03 DIAGNOSIS — G20A1 Parkinson's disease without dyskinesia, without mention of fluctuations: Secondary | ICD-10-CM | POA: Diagnosis not present

## 2022-09-03 DIAGNOSIS — I5043 Acute on chronic combined systolic (congestive) and diastolic (congestive) heart failure: Secondary | ICD-10-CM | POA: Diagnosis not present

## 2022-09-03 DIAGNOSIS — J4489 Other specified chronic obstructive pulmonary disease: Secondary | ICD-10-CM

## 2022-09-07 ENCOUNTER — Telehealth: Payer: Medicare HMO

## 2022-09-09 DIAGNOSIS — J90 Pleural effusion, not elsewhere classified: Secondary | ICD-10-CM | POA: Diagnosis not present

## 2022-09-09 DIAGNOSIS — I509 Heart failure, unspecified: Secondary | ICD-10-CM | POA: Diagnosis not present

## 2022-09-10 ENCOUNTER — Encounter: Payer: Self-pay | Admitting: Nurse Practitioner

## 2022-09-10 ENCOUNTER — Ambulatory Visit: Payer: Medicare HMO | Attending: Nurse Practitioner | Admitting: Nurse Practitioner

## 2022-09-10 VITALS — BP 100/60 | HR 108 | Ht 66.0 in | Wt 210.8 lb

## 2022-09-10 DIAGNOSIS — E785 Hyperlipidemia, unspecified: Secondary | ICD-10-CM | POA: Diagnosis not present

## 2022-09-10 DIAGNOSIS — I951 Orthostatic hypotension: Secondary | ICD-10-CM | POA: Diagnosis not present

## 2022-09-10 DIAGNOSIS — R600 Localized edema: Secondary | ICD-10-CM

## 2022-09-10 DIAGNOSIS — I4891 Unspecified atrial fibrillation: Secondary | ICD-10-CM

## 2022-09-10 DIAGNOSIS — I10 Essential (primary) hypertension: Secondary | ICD-10-CM | POA: Diagnosis not present

## 2022-09-10 DIAGNOSIS — Z79899 Other long term (current) drug therapy: Secondary | ICD-10-CM

## 2022-09-10 DIAGNOSIS — Z9181 History of falling: Secondary | ICD-10-CM | POA: Diagnosis not present

## 2022-09-10 DIAGNOSIS — I5032 Chronic diastolic (congestive) heart failure: Secondary | ICD-10-CM

## 2022-09-10 NOTE — Progress Notes (Addendum)
Office Visit    Patient Name: Allison Rollins Date of Encounter: 09/10/2022  PCP:  Gabriel Earing, FNP   [facility]  Cardiologist:  Dina Rich, MD *** Advanced Practice Provider:  No care team member to display Electrophysiologist:  None  {Press F2 to show EP APP, CHF, sleep or structural heart MD               :[id number]}  { Click here to update then REFRESH NOTE - MD (PCP) or APP (Team Member)  Change PCP Type for MD, Specialty for APP is either Cardiology or Clinical Cardiac Electrophysiology  :[id number]}  Chief Complaint    Allison Rollins is a 72 y.o. female with a hx of A-fib, chronic diastolic CHF, COPD, hypertension, hyperlipidemia, MR, orthostatic hypotension, history of presyncope/fall, Parkinson's disease, who presents today for hospital follow-up.  Past Medical History    Past Medical History:  Diagnosis Date   Anxiety    Atrial fibrillation (HCC) 2023   Eliquis   Chest pain 12/22/2015   CHF (congestive heart failure) (HCC)    COPD (chronic obstructive pulmonary disease) (HCC)    Depression    Headache    HTN (hypertension)    Hypokalemia 07/28/2013   Kidney stones    Osteopenia 01/22/2020   Parkinson's disease    Parkinsonian syndrome associated with symptomatic orthostatic hypotension    Midodrine   Pneumonia    Pre-diabetes    Requires continuous at home supplemental oxygen    3L [place]   S/P colonoscopy 10/15/2004   normal   Vitamin D deficiency 11/24/2018   Past Surgical History:  Procedure Laterality Date   CHOLECYSTECTOMY     COLONOSCOPY  10/15/2004   Normal rectum/Normal colon   COLONOSCOPY N/A 05/10/2016   Procedure: COLONOSCOPY;  Surgeon: West Bali, MD;  [facility];  Service: Endoscopy;  Laterality: N/A;  10:30   ESOPHAGEAL MANOMETRY N/A 04/02/2019   Procedure: ESOPHAGEAL MANOMETRY (EM);  Surgeon: Napoleon Form, MD;  Location: [facility];  Service: Endoscopy;  Laterality: N/A;    ESOPHAGOGASTRODUODENOSCOPY  09/11/2010   Dysphagia likely multifactorial (possible Candida esophagitis, likely nonspecific esophageal motility disorder, and/or uncontrolled gastroesophageal reflux disease), status post empiric dilation   ESOPHAGOGASTRODUODENOSCOPY N/A 11/13/2018   Dr. Darrick Penna: No endoscopic esophageal abnormality to explain patient's dysphagia.  Esophagus dilated.  Moderate erosive/nodular gastritis with benign biopsies.   KIDNEY STONE SURGERY     SAVORY DILATION N/A 11/13/2018   Procedure: SAVORY DILATION;  Surgeon: [place] Bali, MD;  [facility];  Service: Endoscopy;  Laterality: N/A;    Allergies  Allergies  Allergen Reactions   Egg Solids, Whole    Farxiga [Dapagliflozin] Swelling   Penicillins Itching and Rash    Has patient had a PCN reaction causing immediate rash, facial/tongue/throat swelling, SOB or lightheadedness with hypotension: no Has patient had a PCN reaction causing severe rash involving mucus membranes or skin necrosis: no Has patient had a PCN reaction that required hospitalization: no Has patient had a PCN reaction occurring within the last 10 years: no If all of the above answers are "NO", then may proceed with Cephalosporin use.    Sulfa Antibiotics Rash    History of Present Illness    Allison Rollins is a 72 y.o. female with a PMH as mentioned above.  ER visit in April 2023 with presyncope and chest pain.  CT angio chest was negative for PE.  Was admitted in May  2023 due to fall, tested positive for UTI.  Echocardiogram in July 2023 revealed EF 50 to 55%, mild to moderate MR, normal RV, no wall motion abnormalities.  Last seen by Dr. Dina Rich on April 23, 2022.  Was overall doing well, denied any recent edema, LOC, or presyncope.  It was noted at this visit that she had chronic sinus bradycardia, was asymptomatic, previous rates have been normal.  It appears she was diagnosed with new onset A-fib with RVR during ED  visit in February 2024.  Converted to sinus rhythm in the ED.  Was placed on metoprolol, diltiazem, and discharged home with instructions to follow-up with outpatient cardiology.  Seen in A-fib clinic later that months, was educated continue metoprolol 25 mg twice daily.  Admitted March 2024 for A-fib with RVR.  Was on IV Cardizem drip, transition to amiodarone 200 mg twice daily daily for 20 doses, then reduce to 200 mg daily after that.  Echocardiogram revealed global hypokinesis and EF of 40 to 45%, was started on Eliquis for stroke prophylaxis.  Admitted April 2024 due to a fall, was admitted with concern for acute on chronic combined CHF exacerbation, patient was noted to be hypotensive and had some autonomic dysfunction in setting of Parkinson's disease with recurrent orthostasis, started on midodrine twice daily.  Was noted to have UTI, started on Rocephin.  Was told to follow-up with cardiology.  Admitted end of April 2024-early May 2024 for A-fib with RVR, was noted to have chest pain and dyspnea x 2 days.  She was noted to have signs of volume overload, at the time was only taking Lasix as needed.  She had taken Lasix that day, however said she had not taken it the day before.  She was noted to have smothering chest pain, has had a similar episodes to this in the past.  She had noted episodes of diarrhea, 2 days prior to visit.  Due to her poor appetite and diarrhea, she reported she lost 25 pounds in 2 weeks, was concerned that she was also gaining fluid.  Heart rate was 130s on presentation.  Received IV amiodarone, transition to oral amiodarone.  Was discussed that if she continue to remain in A-fib, to discuss possible cardioversion.  SNF was recommended.  Patient opted to go home. Was told to follow-up with outpatient Cardiology.  Recently admitted to [facility] due to having multiple , recurring falls at home.  Treated for UTI.  Heart rate racing during hospitalization, metoprolol was  restarted.  Lasix was restarted.  Today she presents for hospital follow-up. ***  EKGs/Labs/Other Studies Reviewed:   The following studies were reviewed today: ***  EKG:  EKG is *** ordered today.  The ekg ordered today demonstrates ***  Recent Labs: 08/24/2022: B Natriuretic Peptide 1,029.0 08/25/2022: TSH 4.724 08/31/2022: ALT 14; BUN 14; Creatinine, Ser 0.90; Hemoglobin 11.8; Magnesium 1.8; Platelets 180; Potassium 3.7; Sodium 141  Recent Lipid Panel    Component Value Date/Time   CHOL 158 02/24/2022 1513   TRIG 203 (H) 02/24/2022 1513   HDL 39 (L) 02/24/2022 1513   CHOLHDL 4.1 02/24/2022 1513   CHOLHDL 6.9 09/19/2014 0830   VLDL 49 (H) 09/19/2014 0830   LDLCALC 85 02/24/2022 1513    Risk Assessment/Calculations:  {Does this patient have ATRIAL FIBRILLATION?:[phone]}  Home Medications   No outpatient medications have been marked as taking for the 09/10/22 encounter (Office Visit) with Sharlene Dory, NP.     Review of Systems   ***  All other systems reviewed and are otherwise negative except as noted above.  Physical Exam    VS:  BP 100/60   Pulse (!) 108   Ht 5\' 6"  (1.676 m)   Wt 210 lb 12.8 oz (95.6 kg)   SpO2 95%   BMI 34.02 kg/m  , BMI Body mass index is 34.02 kg/m.  Wt Readings from Last 3 Encounters:  09/10/22 210 lb 12.8 oz (95.6 kg)  09/02/22 212 lb 4.9 oz (96.3 kg)  08/28/22 211 lb 6.7 oz (95.9 kg)     GEN: Well nourished, well developed, in no acute distress. HEENT: normal. Neck: Supple, no JVD, carotid bruits, or masses. Cardiac: ***RRR, no murmurs, rubs, or gallops. No clubbing, cyanosis, edema.  ***Radials/PT 2+ and equal bilaterally.  Respiratory:  ***Respirations regular and unlabored, clear to auscultation bilaterally. GI: Soft, nontender, nondistended. MS: No deformity or atrophy. Skin: Warm and dry, no rash. Neuro:  Strength and sensation are intact. Psych: Normal affect.  Assessment & Plan    ***  Shared Decision  Making/Informed Consent{ All outpatient stress tests require an informed consent ([id number]) ATTESTATION ORDER       :[id number]} The risks (stroke, cardiac arrhythmias rarely resulting in the need for a temporary or permanent pacemaker, skin irritation or burns and complications associated with conscious sedation including aspiration, arrhythmia, respiratory failure and death), benefits (restoration of normal sinus rhythm) and alternatives of a direct current cardioversion were explained in detail to Ms. Esguerra and she agrees to proceed.     Thyroid panel, Mag, CMET, CBC in 1 week.      Fatigue, dosing off.     Disposition: Follow up {follow up:15908} with Dina Rich, MD or APP.  Signed, Sharlene Dory, NP 09/10/2022, 2:52 PM [facility]

## 2022-09-10 NOTE — Patient Instructions (Addendum)
Medication Instructions:   Continue all current medications.   Labwork:  CBC, CMET, thyroid panel - orders given  Please do in 1 week   Testing/Procedures:  Your physician has recommended that you have a Cardioversion (DCCV). Electrical Cardioversion uses a jolt of electricity to your heart either through paddles or wired patches attached to your chest. This is a controlled, usually prescheduled, procedure. Defibrillation is done under light anesthesia in the hospital, and you usually go home the day of the procedure. This is done to get your heart back into a normal rhythm. You are not awake for the procedure. Please see the instruction sheet given to you today.   Follow-Up:  3-4 weeks after cardioversion   Any Other Special Instructions Will Be Listed Below (If Applicable).   If you need a refill on your cardiac medications before your next appointment, please call your pharmacy.

## 2022-09-13 ENCOUNTER — Telehealth: Payer: Self-pay | Admitting: Cardiology

## 2022-09-13 NOTE — Telephone Encounter (Signed)
Calling to set up a dccv for the patient but there is no order. Please advise

## 2022-09-14 ENCOUNTER — Ambulatory Visit: Payer: Self-pay | Admitting: *Deleted

## 2022-09-14 ENCOUNTER — Encounter: Payer: Self-pay | Admitting: *Deleted

## 2022-09-14 DIAGNOSIS — J9622 Acute and chronic respiratory failure with hypercapnia: Secondary | ICD-10-CM | POA: Diagnosis not present

## 2022-09-14 DIAGNOSIS — I480 Paroxysmal atrial fibrillation: Secondary | ICD-10-CM | POA: Diagnosis not present

## 2022-09-14 DIAGNOSIS — E8729 Other acidosis: Secondary | ICD-10-CM | POA: Diagnosis not present

## 2022-09-14 DIAGNOSIS — R5381 Other malaise: Secondary | ICD-10-CM | POA: Diagnosis not present

## 2022-09-14 DIAGNOSIS — J9621 Acute and chronic respiratory failure with hypoxia: Secondary | ICD-10-CM | POA: Diagnosis not present

## 2022-09-14 DIAGNOSIS — I5042 Chronic combined systolic (congestive) and diastolic (congestive) heart failure: Secondary | ICD-10-CM | POA: Diagnosis not present

## 2022-09-14 DIAGNOSIS — I4811 Longstanding persistent atrial fibrillation: Secondary | ICD-10-CM | POA: Diagnosis not present

## 2022-09-14 DIAGNOSIS — Z91012 Allergy to eggs: Secondary | ICD-10-CM | POA: Diagnosis not present

## 2022-09-14 DIAGNOSIS — J9611 Chronic respiratory failure with hypoxia: Secondary | ICD-10-CM | POA: Diagnosis not present

## 2022-09-14 DIAGNOSIS — J90 Pleural effusion, not elsewhere classified: Secondary | ICD-10-CM | POA: Diagnosis not present

## 2022-09-14 DIAGNOSIS — Z9989 Dependence on other enabling machines and devices: Secondary | ICD-10-CM | POA: Diagnosis not present

## 2022-09-14 DIAGNOSIS — R5383 Other fatigue: Secondary | ICD-10-CM | POA: Diagnosis not present

## 2022-09-14 DIAGNOSIS — I429 Cardiomyopathy, unspecified: Secondary | ICD-10-CM | POA: Diagnosis not present

## 2022-09-14 DIAGNOSIS — I509 Heart failure, unspecified: Secondary | ICD-10-CM | POA: Diagnosis not present

## 2022-09-14 DIAGNOSIS — Z7901 Long term (current) use of anticoagulants: Secondary | ICD-10-CM | POA: Diagnosis not present

## 2022-09-14 DIAGNOSIS — Z0389 Encounter for observation for other suspected diseases and conditions ruled out: Secondary | ICD-10-CM | POA: Diagnosis not present

## 2022-09-14 DIAGNOSIS — J811 Chronic pulmonary edema: Secondary | ICD-10-CM | POA: Diagnosis not present

## 2022-09-14 DIAGNOSIS — Z882 Allergy status to sulfonamides status: Secondary | ICD-10-CM | POA: Diagnosis not present

## 2022-09-14 DIAGNOSIS — I5023 Acute on chronic systolic (congestive) heart failure: Secondary | ICD-10-CM | POA: Diagnosis not present

## 2022-09-14 DIAGNOSIS — F0390 Unspecified dementia without behavioral disturbance: Secondary | ICD-10-CM | POA: Diagnosis not present

## 2022-09-14 DIAGNOSIS — R069 Unspecified abnormalities of breathing: Secondary | ICD-10-CM | POA: Diagnosis not present

## 2022-09-14 DIAGNOSIS — J441 Chronic obstructive pulmonary disease with (acute) exacerbation: Secondary | ICD-10-CM | POA: Diagnosis not present

## 2022-09-14 DIAGNOSIS — I482 Chronic atrial fibrillation, unspecified: Secondary | ICD-10-CM | POA: Diagnosis not present

## 2022-09-14 DIAGNOSIS — I110 Hypertensive heart disease with heart failure: Secondary | ICD-10-CM | POA: Diagnosis not present

## 2022-09-14 DIAGNOSIS — E785 Hyperlipidemia, unspecified: Secondary | ICD-10-CM | POA: Diagnosis not present

## 2022-09-14 DIAGNOSIS — Z9981 Dependence on supplemental oxygen: Secondary | ICD-10-CM | POA: Diagnosis not present

## 2022-09-14 DIAGNOSIS — G9341 Metabolic encephalopathy: Secondary | ICD-10-CM | POA: Diagnosis not present

## 2022-09-14 DIAGNOSIS — G20A1 Parkinson's disease without dyskinesia, without mention of fluctuations: Secondary | ICD-10-CM | POA: Diagnosis not present

## 2022-09-14 DIAGNOSIS — Z79899 Other long term (current) drug therapy: Secondary | ICD-10-CM | POA: Diagnosis not present

## 2022-09-14 DIAGNOSIS — I5043 Acute on chronic combined systolic (congestive) and diastolic (congestive) heart failure: Secondary | ICD-10-CM | POA: Diagnosis not present

## 2022-09-14 DIAGNOSIS — J449 Chronic obstructive pulmonary disease, unspecified: Secondary | ICD-10-CM | POA: Diagnosis not present

## 2022-09-14 DIAGNOSIS — K2100 Gastro-esophageal reflux disease with esophagitis, without bleeding: Secondary | ICD-10-CM | POA: Diagnosis not present

## 2022-09-14 DIAGNOSIS — J9811 Atelectasis: Secondary | ICD-10-CM | POA: Diagnosis not present

## 2022-09-14 DIAGNOSIS — Z880 Allergy status to penicillin: Secondary | ICD-10-CM | POA: Diagnosis not present

## 2022-09-14 DIAGNOSIS — Z7952 Long term (current) use of systemic steroids: Secondary | ICD-10-CM | POA: Diagnosis not present

## 2022-09-14 DIAGNOSIS — I10 Essential (primary) hypertension: Secondary | ICD-10-CM | POA: Diagnosis not present

## 2022-09-14 DIAGNOSIS — Z743 Need for continuous supervision: Secondary | ICD-10-CM | POA: Diagnosis not present

## 2022-09-14 DIAGNOSIS — E669 Obesity, unspecified: Secondary | ICD-10-CM | POA: Diagnosis not present

## 2022-09-14 DIAGNOSIS — I951 Orthostatic hypotension: Secondary | ICD-10-CM | POA: Diagnosis not present

## 2022-09-14 DIAGNOSIS — Z5941 Food insecurity: Secondary | ICD-10-CM | POA: Diagnosis not present

## 2022-09-14 DIAGNOSIS — R0602 Shortness of breath: Secondary | ICD-10-CM | POA: Diagnosis not present

## 2022-09-14 DIAGNOSIS — G2581 Restless legs syndrome: Secondary | ICD-10-CM | POA: Diagnosis not present

## 2022-09-14 DIAGNOSIS — R4182 Altered mental status, unspecified: Secondary | ICD-10-CM | POA: Diagnosis not present

## 2022-09-14 DIAGNOSIS — R0902 Hypoxemia: Secondary | ICD-10-CM | POA: Diagnosis not present

## 2022-09-14 NOTE — Patient Outreach (Signed)
Care Coordination   Follow Up Visit Note   09/14/2022  Name: Allison Rollins MRN: [id number] DOB: 06/23/1950  Allison Rollins is a 72 y.o. year old female who sees Allison Earing, FNP for primary care. I spoke with patient's son, [place] Plantz by phone today.  What matters to the patients health and wellness today?  Find Help in My Community.   Goals Addressed               This Visit's Progress     Find Help in My Community. (pt-stated)   On track     Care Coordination Interventions:  Interventions Today    Flowsheet Row Most Recent Value  Chronic Disease   Chronic disease during today's visit Hypertension (HTN), Chronic Obstructive Pulmonary Disease (COPD), Other, Chronic Kidney Disease/End Stage Renal Disease (ESRD)  [Parkinson's Disease, Neuropathy, Recurrent Depression & Generalized Anxiety Disorder]  General Interventions   General Interventions Discussed/Reviewed General Interventions Discussed, General Interventions Reviewed, Annual Eye Exam, Labs, Durable Medical Equipment (DME), Vaccines, Health Screening, Community Resources, Doctor Visits, Level of Care  Labs Hgb A1c annually  Vaccines COVID-19, Flu, Pneumonia, RSV, Shingles  Doctor Visits Discussed/Reviewed Doctor Visits Discussed, Doctor Visits Reviewed, Annual Wellness Visits, PCP, Specialist  Health Screening Colonoscopy, Bone Density, Mammogram  Durable Medical Equipment (DME) BP Cuff, Walker, Psychologist, forensic  PCP/Specialist Visits Compliance with follow-up visit  Level of Care Applications, Assisted Living, Personal Care Counsellor, Personal Care Services, Other  [facility] Health Financial Assistance Application]  Exercise Interventions   Exercise Discussed/Reviewed Exercise Discussed, Exercise Reviewed, Physical Activity, Assistive device use and maintanence  Physical Activity Discussed/Reviewed Physical Activity Discussed, Physical Activity Reviewed, Types of exercise   Education Interventions   Education Provided Provided Therapist, sports, Provided Education, Provided Web-based Education  Provided Verbal Education On Nutrition, Eye Care, Mental Health/Coping with Illness, Applications, Exercise, Medication, Development worker, community, [employer], When to see the doctor  Applications Medicaid, Personal Care Services, Other  [employer] Health Financial Assistance Application]  Mental Health Interventions   Mental Health Discussed/Reviewed Mental Health Discussed, Mental Health Reviewed, Coping Strategies, Crisis, Anxiety, Depression  Nutrition Interventions   Nutrition Discussed/Reviewed Nutrition Discussed, Nutrition Reviewed, Fluid intake, Portion sizes, Decreasing sugar intake, Decreasing salt, Decreasing fats, Increaing proteins  Pharmacy Interventions   Pharmacy Dicussed/Reviewed Pharmacy Topics Discussed, Pharmacy Topics Reviewed, Medication Adherence, Affording Medications  Safety Interventions   Safety Discussed/Reviewed Safety Discussed, Safety Reviewed, Fall Risk  Advanced Directive Interventions   Advanced Directives Discussed/Reviewed Advanced Directives Discussed, Advanced Directives Reviewed     Active Listening & Reflection Utilized.  Verbalization of Feelings Encouraged.  Emotional Support Provided. Feelings of Frustration Validated. Caregiver Stress Acknowledged. Problem Solving Interventions Implemented. Solution-Focused Strategies Employed. Task-Centered Solutions Activated. CSW Collaboration with Son, [place] Associate Professor to Confirm Interest in Patient Receiving Long-Term Care Placement in A Memory Care Assisted Living Facility, Upon Discharge from [facility] [phone]), Where Patient Currently Resides to Receive Short-Term Rehabilitative Services. CSW Collaboration with Son, [place] Associate Professor to [employer] with Kristin Bruins, Medicaid Case Worker with [facility] Department of Social  Services [phone]), to Check Status of Medicaid Application. CSW Collaboration with Son, [place] Associate Professor to [employer] Difference Between [facility] Coverage & Special Assistance Long-Term Care Medicaid Coverage, through The [facility] of Social Services [phone]). CSW Collaboration with Son, [place] Associate Professor to [employer] of Medicaid, through The [employer] of [facility] [phone]), If Plan is For  Patient to Return Home to Live Alone & Receive Personal Care Services, through [facility] [phone]). CSW Collaboration with Son, [place] Plantz to [employer] of Special Assistance Long-Term Care Medicaid, through The [facility] of Social Services [phone]), If Plan is For Patient to Receive Long-Term Care Placement in A Memory Care Assisted Living Facility, Upon Discharge from [facility] [phone]). CSW Collaboration with Son, [place] Plantz to Request Review of The Following List of Applications, Information & Long-Term Memory Care Assisted Living Facilities, Emailed on 09/14/2022:  ~ 2023 Medicaid Tips  ~ Medicaid Application  ~ Tips for Applying for Medicaid Online  ~ Personal Care Services Application  ~ Personal Care Services Agency Providers  ~ Long-Term Memory Care Assisted Living Facilities CSW Collaboration with Son, [place] Plantz to [employer] with CSW (# [phone]), if He Has Questions, Needs Assistance, or If Additional Social Work Needs Are Identified Between Now & Our Next Scheduled Follow-Up Outreach Call.      SDOH assessments and interventions completed:  Yes.  Care Coordination Interventions:  Yes, provided.   Follow up plan: Follow up call scheduled for 09/28/2022 at 10:45 am.  Encounter Outcome:  Pt. Visit Completed.   Danford Bad, BSW, MSW, LCSW  Licensed Designer, [facility]  Mailing [place] [address] [place], [place] [address] Physical Address-[address], [place], [place] [address] Toll Free Main # [phone] Fax # [phone] Cell # [phone] [email or web address]

## 2022-09-14 NOTE — Telephone Encounter (Signed)
Scheduled DCCV with Dr. Wyline Mood on Thursday,  5/30 @ 9:30 am. Pre- Op: Tuesday,  5/28 @ 10:30am.

## 2022-09-14 NOTE — Telephone Encounter (Signed)
[place] notified - she will come by office to pick up her instruction sheet as she is in this area.  States they are having trouble with their fax machine.

## 2022-09-14 NOTE — Patient Instructions (Signed)
Visit Information  Thank you for taking time to visit with me today. Please don't hesitate to contact me if I can be of assistance to you.   Following are the goals we discussed today:   Goals Addressed               This Visit's Progress     Find Help in My Community. (pt-stated)   On track     Care Coordination Interventions:  Interventions Today    Flowsheet Row Most Recent Value  Chronic Disease   Chronic disease during today's visit Hypertension (HTN), Chronic Obstructive Pulmonary Disease (COPD), Other, Chronic Kidney Disease/End Stage Renal Disease (ESRD)  [Parkinson's Disease, Neuropathy, Recurrent Depression & Generalized Anxiety Disorder]  General Interventions   General Interventions Discussed/Reviewed General Interventions Discussed, General Interventions Reviewed, Annual Eye Exam, Labs, Durable Medical Equipment (DME), Vaccines, Health Screening, Community Resources, Doctor Visits, Level of Care  Labs Hgb A1c annually  Vaccines COVID-19, Flu, Pneumonia, RSV, Shingles  Doctor Visits Discussed/Reviewed Doctor Visits Discussed, Doctor Visits Reviewed, Annual Wellness Visits, PCP, Specialist  Health Screening Colonoscopy, Bone Density, Mammogram  Durable Medical Equipment (DME) BP Cuff, Walker, Psychologist, forensic  PCP/Specialist Visits Compliance with follow-up visit  Level of Care Applications, Assisted Living, Personal Care Counsellor, Personal Care Services, Other  [facility] Health Financial Assistance Application]  Exercise Interventions   Exercise Discussed/Reviewed Exercise Discussed, Exercise Reviewed, Physical Activity, Assistive device use and maintanence  Physical Activity Discussed/Reviewed Physical Activity Discussed, Physical Activity Reviewed, Types of exercise  Education Interventions   Education Provided Provided Therapist, sports, Provided Education, Provided Web-based Education  Provided Verbal Education On Nutrition, Eye  Care, Mental Health/Coping with Illness, Applications, Exercise, Medication, Development worker, community, [employer], When to see the doctor  Applications Medicaid, Personal Care Services, Other  [employer] Health Financial Assistance Application]  Mental Health Interventions   Mental Health Discussed/Reviewed Mental Health Discussed, Mental Health Reviewed, Coping Strategies, Crisis, Anxiety, Depression  Nutrition Interventions   Nutrition Discussed/Reviewed Nutrition Discussed, Nutrition Reviewed, Fluid intake, Portion sizes, Decreasing sugar intake, Decreasing salt, Decreasing fats, Increaing proteins  Pharmacy Interventions   Pharmacy Dicussed/Reviewed Pharmacy Topics Discussed, Pharmacy Topics Reviewed, Medication Adherence, Affording Medications  Safety Interventions   Safety Discussed/Reviewed Safety Discussed, Safety Reviewed, Fall Risk  Advanced Directive Interventions   Advanced Directives Discussed/Reviewed Advanced Directives Discussed, Advanced Directives Reviewed     Active Listening & Reflection Utilized.  Verbalization of Feelings Encouraged.  Emotional Support Provided. Feelings of Frustration Validated. Caregiver Stress Acknowledged. Problem Solving Interventions Implemented. Solution-Focused Strategies Employed. Task-Centered Solutions Activated. CSW Collaboration with Son, [place] Associate Professor to Confirm Interest in Patient Receiving Long-Term Care Placement in A Memory Care Assisted Living Facility, Upon Discharge from [facility] [phone]), Where Patient Currently Resides to Receive Short-Term Rehabilitative Services. CSW Collaboration with Son, [place] Associate Professor to [employer] with Kristin Bruins, Medicaid Case Worker with [facility] Department of Social Services [phone]), to Check Status of Medicaid Application. CSW Collaboration with Son, [place] Associate Professor to [employer] Difference Between [facility] Coverage & Special  Assistance Long-Term Care Medicaid Coverage, through The [facility] of Social Services [phone]). CSW Collaboration with Son, [place] Associate Professor to [employer] of Medicaid, through The [employer] of [facility] [phone]), If Plan is For Patient to Return Home to Live Alone & Receive Personal Care Services, through [facility] [phone]). CSW Collaboration with Son, [place] Associate Professor to [employer] of Special Assistance Long-Term  Care Medicaid, through The [facility] of Social Services [phone]), If Plan is For Patient to Receive Long-Term Care Placement in A Memory Care Assisted Living Facility, Upon Discharge from [facility] [phone]). CSW Collaboration with Son, [place] Plantz to Request Review of The Following List of Applications, Information & Long-Term Memory Care Assisted Living Facilities, Emailed on 09/14/2022:  ~ 2023 Medicaid Tips  ~ Medicaid Application  ~ Tips for Applying for Medicaid Online  ~ Personal Care Services Application  ~ Personal Care Services Agency Providers  ~ Long-Term Memory Care Assisted Living Facilities CSW Collaboration with Son, [place] Plantz to [employer] with CSW (# [phone]), if He Has Questions, Needs Assistance, or If Additional Social Work Needs Are Identified Between Now & Our Next Scheduled Follow-Up Outreach Call.      Our next appointment is by telephone on 09/28/2022 at 10:45 am.  Please call the care guide team at [phone] if you need to cancel or reschedule your appointment.   If you are experiencing a Mental Health or Behavioral Health Crisis or need someone to talk to, please call the Suicide and Crisis Lifeline: 988 call the [place] National Suicide Prevention Lifeline: 8486091724 or TTY: (971) 582-5531 TTY 301-070-2499) to talk to a trained counselor call 1-800-273-TALK (toll free, 24 hour  hotline) go to [facility] [address] [place] [phone]) call the [facility] Crisis Line: [phone] call 911  Patient verbalizes understanding of instructions and care plan provided today and agrees to view in MyChart. Active MyChart status and patient understanding of how to access instructions and care plan via MyChart confirmed with patient.     Telephone follow up appointment with care management team member scheduled for: 09/28/2022 at 10:45 am.  Danford Bad, BSW, MSW, LCSW  Licensed Clinical Social Worker  [employer] [facility] [facility]  Mailing [place]. [address] [place], [place] [address] Physical Address-[address] [place], [place] [address] Toll Free Main # [phone] Fax # [phone] Cell # [phone] [email or web address]

## 2022-09-16 ENCOUNTER — Telehealth: Payer: Medicare HMO

## 2022-09-16 DIAGNOSIS — J811 Chronic pulmonary edema: Secondary | ICD-10-CM | POA: Diagnosis not present

## 2022-09-16 DIAGNOSIS — J90 Pleural effusion, not elsewhere classified: Secondary | ICD-10-CM | POA: Diagnosis not present

## 2022-09-16 DIAGNOSIS — R0602 Shortness of breath: Secondary | ICD-10-CM | POA: Diagnosis not present

## 2022-09-16 NOTE — Patient Instructions (Signed)
Your procedure is scheduled on: 09/23/2022  Report to [facility] Main Entrance at   7:45  AM.  Call this number if you have problems the morning of surgery: [phone]   Remember:   Do not Eat or Drink after midnight         No Smoking the morning of surgery  Do not miss any doses of Eliquis  :  Take these medicines the morning of surgery with A SIP OF WATER: Amiodarone, eliquis, pepcid, pantoprazole, and zoloft   use inhalers if needed   Do not wear jewelry, make-up or nail polish.  Do not wear lotions, powders, or perfumes. You may wear deodorant.  Do not shave 48 hours prior to surgery. Men may shave face and neck.  Do not bring valuables to the hospital.  Contacts, dentures or bridgework may not be worn into surgery.  Leave suitcase in the car. After surgery it may be brought to your room.  For patients admitted to the hospital, checkout time is 11:00 AM the day of discharge.   Patients discharged the day of surgery will not be allowed to drive home.    Electrical Cardioversion Electrical cardioversion is the delivery of a jolt of electricity to restore a normal rhythm to the heart. A rhythm that is too fast or is not regular (arrhythmia) keeps the heart from pumping blood well. There is also another type of cardioversion called a chemical (pharmacologic) cardioversion. This is when your health care provider gives you one or more medicines to bring back your regular heart rhythm. Electrical cardioversion is done as a scheduled procedure for arrhythmiasthat are not life-threatening. Electrical cardioversion may also be done in an emergency for sudden life-threatening arrhythmias. Tell a health care provider about: Any allergies you have. All medicines you are taking, including vitamins, herbs, eye drops, creams, and over-the-counter medicines. Any problems you or family members have had with sedatives or anesthesia. Any bleeding problems you have. Any surgeries you have had,  including a pacemaker, defibrillator, or other implanted device. Any medical conditions you have. Whether you are pregnant or may be pregnant. What are the risks? Your provider will talk with you about risks. These include: Allergic reactions to medicines. Irritation to the skin on your chest or back where the sticky pads (electrodes) or paddles were put during electrical cardioversion. A blood clot that breaks free and travels to other parts of your body, such as your brain. Return of a worse abnormal heart rhythm that will need to be treated with medicines, a pacemaker, or an implantable cardioverter defibrillator (ICD). What happens before the procedure? Medicines Your provider may give you: Blood-thinning medicines (anticoagulants) so your blood does not clot as easily. If your provider gives you this medicine, you may need to take it for 4 weeks before the procedure. Medicines to help stabilize your heart rate and rhythm. Ask your provider about: Changing or stopping your regular medicines. These include any diabetes medicines or blood thinners you take. Taking medicines such as aspirin and ibuprofen. These medicines can thin your blood. Do not take them unless your provider tells you to. Taking over-the-counter medicines, vitamins, herbs, and supplements. General instructions Follow instructions from your provider about what you may eat and drink. Do not put any lotions, powders, or ointments on your chest and back for 24 hours before the procedure. They can cause problems with the electrodes or paddles used to deliver electricity to your heart. Do not wear jewelry as this can interfere  with delivering electricity to your heart. If you will be going home right after the procedure, plan to have a responsible adult: Take you home from the hospital or clinic. You will not be allowed to drive. Care for you for the time you are told. Tests You may have an exam or testing. This may  include: Blood labs. A transesophageal echocardiogram (TEE). What happens during the procedure?     An IV will be inserted into one of your veins. You will be given a sedative. This helps you relax. Electrodes or metal paddles will be placed on your chest. They may be placed in one of these ways: One placed on your right chest, the other on the left ribs. One placed on your chest and the other on your back. An electrical shock will be delivered. The shock briefly stops (resets) your heart rhythm. Your provider will check to see if your heart rhythm is now normal. Some people need only one shock. Some need more to restore a normal heart rhythm. The procedure may vary among providers and hospitals. What happens after the procedure? Your blood pressure, heart rate, breathing rate, and blood oxygen level will be monitored until you leave the hospital or clinic. Your heart rhythm will be watched to make sure it does not change. This information is not intended to replace advice given to you by your health care provider. Make sure you discuss any questions you have with your health care provider. Document Revised: 12/03/2021 Document Reviewed: 12/03/2021 Elsevier Patient Education  2024 Elsevier Inc. General Anesthesia, Adult, Care After The following information offers guidance on how to care for yourself after your procedure. Your health care provider may also give you more specific instructions. If you have problems or questions, contact your health care provider. What can I expect after the procedure? After the procedure, it is common for people to: Have pain or discomfort at the IV site. Have nausea or vomiting. Have a sore throat or hoarseness. Have trouble concentrating. Feel cold or chills. Feel weak, sleepy, or tired (fatigue). Have soreness and body aches. These can affect parts of the body that were not involved in surgery. Follow these instructions at home: For the time period  you were told by your health care provider:  Rest. Do not participate in activities where you could fall or become injured. Do not drive or use machinery. Do not drink alcohol. Do not take sleeping pills or medicines that cause drowsiness. Do not make important decisions or sign legal documents. Do not take care of children on your own. General instructions Drink enough fluid to keep your urine pale yellow. If you have sleep apnea, surgery and certain medicines can increase your risk for breathing problems. Follow instructions from your health care provider about wearing your sleep device: Anytime you are sleeping, including during daytime naps. While taking prescription pain medicines, sleeping medicines, or medicines that make you drowsy. Return to your normal activities as told by your health care provider. Ask your health care provider what activities are safe for you. Take over-the-counter and prescription medicines only as told by your health care provider. Do not use any products that contain nicotine or tobacco. These products include cigarettes, chewing tobacco, and vaping devices, such as e-cigarettes. These can delay incision healing after surgery. If you need help quitting, ask your health care provider. Contact a health care provider if: You have nausea or vomiting that does not get better with medicine. You vomit every time you  eat or drink. You have pain that does not get better with medicine. You cannot urinate or have bloody urine. You develop a skin rash. You have a fever. Get help right away if: You have trouble breathing. You have chest pain. You vomit blood. These symptoms may be an emergency. Get help right away. Call 911. Do not wait to see if the symptoms will go away. Do not drive yourself to the hospital. Summary After the procedure, it is common to have a sore throat, hoarseness, nausea, vomiting, or to feel weak, sleepy, or fatigue. For the time period you  were told by your health care provider, do not drive or use machinery. Get help right away if you have difficulty breathing, have chest pain, or vomit blood. These symptoms may be an emergency. This information is not intended to replace advice given to you by your health care provider. Make sure you discuss any questions you have with your health care provider. Document Revised: 07/10/2021 Document Reviewed: 07/10/2021 Elsevier Patient Education  2024 [employer].

## 2022-09-18 NOTE — Addendum Note (Signed)
Addended by: Sharlene Dory on: 09/18/2022 11:16 PM   Modules accepted: Orders

## 2022-09-21 ENCOUNTER — Telehealth: Payer: Self-pay | Admitting: Nurse Practitioner

## 2022-09-21 ENCOUNTER — Encounter (HOSPITAL_COMMUNITY): Payer: Self-pay

## 2022-09-21 ENCOUNTER — Encounter (HOSPITAL_COMMUNITY)
Admission: RE | Admit: 2022-09-21 | Discharge: 2022-09-21 | Disposition: A | Discharge status: Home / Self Care | Payer: Medicare HMO | Source: Ambulatory Visit | Attending: Cardiology | Admitting: Cardiology

## 2022-09-21 ENCOUNTER — Telehealth: Payer: Self-pay | Admitting: Cardiology

## 2022-09-21 DIAGNOSIS — T502X5A Adverse effect of carbonic-anhydrase inhibitors, benzothiadiazides and other diuretics, initial encounter: Secondary | ICD-10-CM

## 2022-09-21 NOTE — Telephone Encounter (Signed)
Checking percert on the following patient for testing scheduled at [facility].    DCCV with Dr. Wyline Mood on Thursday, 5/30 @ 9:30 am

## 2022-09-21 NOTE — Telephone Encounter (Signed)
Spoke with [name] (listed on DPR) who is daughter-in-law and Charity fundraiser at [facility].  Updated her regarding scheduled cardioversion for 08/27/2022.  Stated patient told me that she had been compliant for 3 weeks on Eliquis.  April stated that patient has not been compliant on Eliquis.  April discussed with me some medication compliance issues as patient has been in and out of hospital over the past several weeks.  She is currently hospitalized at [facility].  April stated that she would like patient to return to SNF upon discharge as pt is high risk for falls.  I agreed with April that patient would benefit from SNF at discharge.  Discussed with April that we will cancel upcoming DCCV due to medication compliance issues.  April agreed with this and stated she does not feel comfortable allowing pt to undergo cardioversion at this time due to compliance issues with medicines.  Will route this note over to my nursing staff and we will cancel cardioversion.  I plan to see patient back for follow-up after she has been discharged from hospital.  Sharlene Dory, NP

## 2022-09-22 DIAGNOSIS — M62521 Muscle wasting and atrophy, not elsewhere classified, right upper arm: Secondary | ICD-10-CM | POA: Diagnosis not present

## 2022-09-22 DIAGNOSIS — I10 Essential (primary) hypertension: Secondary | ICD-10-CM | POA: Diagnosis not present

## 2022-09-22 DIAGNOSIS — F419 Anxiety disorder, unspecified: Secondary | ICD-10-CM | POA: Diagnosis not present

## 2022-09-22 DIAGNOSIS — E785 Hyperlipidemia, unspecified: Secondary | ICD-10-CM | POA: Diagnosis not present

## 2022-09-22 DIAGNOSIS — Z743 Need for continuous supervision: Secondary | ICD-10-CM | POA: Diagnosis not present

## 2022-09-22 DIAGNOSIS — Z515 Encounter for palliative care: Secondary | ICD-10-CM | POA: Diagnosis not present

## 2022-09-22 DIAGNOSIS — F413 Other mixed anxiety disorders: Secondary | ICD-10-CM | POA: Diagnosis not present

## 2022-09-22 DIAGNOSIS — J9611 Chronic respiratory failure with hypoxia: Secondary | ICD-10-CM | POA: Diagnosis not present

## 2022-09-22 DIAGNOSIS — R441 Visual hallucinations: Secondary | ICD-10-CM | POA: Diagnosis not present

## 2022-09-22 DIAGNOSIS — R531 Weakness: Secondary | ICD-10-CM | POA: Diagnosis not present

## 2022-09-22 DIAGNOSIS — F411 Generalized anxiety disorder: Secondary | ICD-10-CM | POA: Diagnosis not present

## 2022-09-22 DIAGNOSIS — R262 Difficulty in walking, not elsewhere classified: Secondary | ICD-10-CM | POA: Diagnosis not present

## 2022-09-22 DIAGNOSIS — Z9981 Dependence on supplemental oxygen: Secondary | ICD-10-CM | POA: Diagnosis not present

## 2022-09-22 DIAGNOSIS — E119 Type 2 diabetes mellitus without complications: Secondary | ICD-10-CM | POA: Diagnosis not present

## 2022-09-22 DIAGNOSIS — G20A1 Parkinson's disease without dyskinesia, without mention of fluctuations: Secondary | ICD-10-CM | POA: Diagnosis not present

## 2022-09-22 DIAGNOSIS — F4323 Adjustment disorder with mixed anxiety and depressed mood: Secondary | ICD-10-CM | POA: Diagnosis not present

## 2022-09-22 DIAGNOSIS — I5042 Chronic combined systolic (congestive) and diastolic (congestive) heart failure: Secondary | ICD-10-CM | POA: Diagnosis not present

## 2022-09-22 DIAGNOSIS — J90 Pleural effusion, not elsewhere classified: Secondary | ICD-10-CM | POA: Diagnosis not present

## 2022-09-22 DIAGNOSIS — F4320 Adjustment disorder, unspecified: Secondary | ICD-10-CM | POA: Diagnosis not present

## 2022-09-22 DIAGNOSIS — G629 Polyneuropathy, unspecified: Secondary | ICD-10-CM | POA: Diagnosis not present

## 2022-09-22 DIAGNOSIS — J449 Chronic obstructive pulmonary disease, unspecified: Secondary | ICD-10-CM | POA: Diagnosis not present

## 2022-09-22 DIAGNOSIS — J9621 Acute and chronic respiratory failure with hypoxia: Secondary | ICD-10-CM | POA: Diagnosis not present

## 2022-09-22 DIAGNOSIS — R5381 Other malaise: Secondary | ICD-10-CM | POA: Diagnosis not present

## 2022-09-22 DIAGNOSIS — F5105 Insomnia due to other mental disorder: Secondary | ICD-10-CM | POA: Diagnosis not present

## 2022-09-22 DIAGNOSIS — I429 Cardiomyopathy, unspecified: Secondary | ICD-10-CM | POA: Diagnosis not present

## 2022-09-22 DIAGNOSIS — F32A Depression, unspecified: Secondary | ICD-10-CM | POA: Diagnosis not present

## 2022-09-22 DIAGNOSIS — R41841 Cognitive communication deficit: Secondary | ICD-10-CM | POA: Diagnosis not present

## 2022-09-22 DIAGNOSIS — R0902 Hypoxemia: Secondary | ICD-10-CM | POA: Diagnosis not present

## 2022-09-22 DIAGNOSIS — R451 Restlessness and agitation: Secondary | ICD-10-CM | POA: Diagnosis not present

## 2022-09-22 DIAGNOSIS — R2689 Other abnormalities of gait and mobility: Secondary | ICD-10-CM | POA: Diagnosis not present

## 2022-09-22 DIAGNOSIS — M62522 Muscle wasting and atrophy, not elsewhere classified, left upper arm: Secondary | ICD-10-CM | POA: Diagnosis not present

## 2022-09-22 DIAGNOSIS — J9622 Acute and chronic respiratory failure with hypercapnia: Secondary | ICD-10-CM | POA: Diagnosis not present

## 2022-09-22 DIAGNOSIS — I482 Chronic atrial fibrillation, unspecified: Secondary | ICD-10-CM | POA: Diagnosis not present

## 2022-09-22 DIAGNOSIS — I5022 Chronic systolic (congestive) heart failure: Secondary | ICD-10-CM | POA: Diagnosis not present

## 2022-09-22 DIAGNOSIS — M6281 Muscle weakness (generalized): Secondary | ICD-10-CM | POA: Diagnosis not present

## 2022-09-22 NOTE — Progress Notes (Signed)
Call made to short stay at [facility] - cardioversion cancelled.

## 2022-09-22 NOTE — Progress Notes (Signed)
Patient has been admitted to [facility] at this time.  I reached out to her son and he states that she will reschedule at a later date.

## 2022-09-23 ENCOUNTER — Ambulatory Visit (HOSPITAL_COMMUNITY): Admission: RE | Admit: 2022-09-23 | Payer: Medicare HMO | Source: Ambulatory Visit | Admitting: Cardiology

## 2022-09-23 ENCOUNTER — Encounter (HOSPITAL_COMMUNITY): Admission: RE | Payer: Self-pay | Source: Ambulatory Visit

## 2022-09-23 SURGERY — CARDIOVERSION
Anesthesia: Monitor Anesthesia Care

## 2022-09-24 DIAGNOSIS — I5032 Chronic diastolic (congestive) heart failure: Secondary | ICD-10-CM

## 2022-09-24 DIAGNOSIS — I480 Paroxysmal atrial fibrillation: Secondary | ICD-10-CM

## 2022-09-28 ENCOUNTER — Encounter: Payer: Self-pay | Admitting: *Deleted

## 2022-09-28 ENCOUNTER — Ambulatory Visit: Payer: Self-pay | Admitting: *Deleted

## 2022-09-28 NOTE — Patient Outreach (Signed)
Care Coordination   Follow Up Visit Note   09/28/2022  Name: Allison Rollins MRN: [id number] DOB: 17-Oct-1950  Allison Rollins is a 72 y.o. year old female who sees Gabriel Earing, FNP for primary care. I spoke with patient's daughter-in-law, [name] by phone today.  What matters to the patients health and wellness today?  Find Help in My Community.   Goals Addressed               This Visit's Progress     Find Help in My Community. (pt-stated)   On track     Care Coordination Interventions:  Interventions Today    Flowsheet Row Most Recent Value  Chronic Disease   Chronic disease during today's visit Hypertension (HTN), Chronic Obstructive Pulmonary Disease (COPD), Other, Chronic Kidney Disease/End Stage Renal Disease (ESRD)  [Parkinson's Disease, Neuropathy, Recurrent Depression & Generalized Anxiety Disorder]  General Interventions   General Interventions Discussed/Reviewed General Interventions Discussed, General Interventions Reviewed, Annual Eye Exam, Labs, Durable Medical Equipment (DME), Vaccines, Health Screening, Community Resources, Doctor Visits, Level of Care  Labs Hgb A1c annually  Vaccines COVID-19, Flu, Pneumonia, RSV, Shingles  Doctor Visits Discussed/Reviewed Doctor Visits Discussed, Doctor Visits Reviewed, Annual Wellness Visits, PCP, Specialist  Health Screening Colonoscopy, Bone Density, Mammogram  Durable Medical Equipment (DME) BP Cuff, Walker, Psychologist, forensic  PCP/Specialist Visits Compliance with follow-up visit  Level of Care Applications, Assisted Living, Personal Care Counsellor, Personal Care Services, Other  [facility] Health Financial Assistance Application]  Exercise Interventions   Exercise Discussed/Reviewed Exercise Discussed, Exercise Reviewed, Physical Activity, Assistive device use and maintanence  Physical Activity Discussed/Reviewed Physical Activity Discussed, Physical Activity Reviewed, Types of  exercise  Education Interventions   Education Provided Provided Therapist, sports, Provided Education, Provided Web-based Education  Provided Verbal Education On Nutrition, Eye Care, Mental Health/Coping with Illness, Applications, Exercise, Medication, Development worker, community, [employer], When to see the doctor  Applications Medicaid, Personal Care Services, Other  [employer] Health Financial Assistance Application]  Mental Health Interventions   Mental Health Discussed/Reviewed Mental Health Discussed, Mental Health Reviewed, Coping Strategies, Crisis, Anxiety, Depression  Nutrition Interventions   Nutrition Discussed/Reviewed Nutrition Discussed, Nutrition Reviewed, Fluid intake, Portion sizes, Decreasing sugar intake, Decreasing salt, Decreasing fats, Increaing proteins  Pharmacy Interventions   Pharmacy Dicussed/Reviewed Pharmacy Topics Discussed, Pharmacy Topics Reviewed, Medication Adherence, Affording Medications  Safety Interventions   Safety Discussed/Reviewed Safety Discussed, Safety Reviewed, Fall Risk  Advanced Directive Interventions   Advanced Directives Discussed/Reviewed Advanced Directives Discussed, Advanced Directives Reviewed     Active Listening & Reflection Utilized.  Verbalization of Feelings Encouraged.  Emotional Support Provided. Feelings of Frustration Validated. Caregiver Stress Acknowledged. Problem Solving Interventions Implemented. Solution-Focused Strategies Employed. Task-Centered Solutions Activated. CSW Collaboration with Daughter-in-Law, [name] to Confirm Continued Residence at [facility] [phone]), to Receive Short-Term Rehabilitative Services. CSW Collaboration with Daughter-in-Law, [name] to [employer] with Kristin Bruins, Medicaid Case Worker with [facility] Department of Social Services [phone]), to Check Status of Special Assistance Long-Term Care Medicaid  Application. CSW Collaboration with Daughter-in-Law, [name] to Confirm Receipt & Thoroughly Review the Following List of Applications, Information & Long-Term Memory Care Assisted Living Facilities:  ~ 2023 Medicaid Tips  ~ Medicaid Application  ~ Tips for Applying for Medicaid Online  ~ Personal Care Services Application  ~ Personal Care Services Agency Providers  ~ Long-Term Memory Care Assisted Living Facilities CSW Collaboration with Daughter-in-Law, April  Plantz to [employer] with CSW (# [phone]), if She Has Questions, Needs Assistance, or If Additional Social Work Needs Are Identified Between Now & Our Next Scheduled Follow-Up Outreach Call.      SDOH assessments and interventions completed:  Yes.  Care Coordination Interventions:  Yes, provided.   Follow up plan: Follow up call scheduled for 10/12/2022 at 10:00 am.  Encounter Outcome:  Pt. Visit Completed.   Danford Bad, BSW, MSW, LCSW  Licensed Restaurant manager, [employer] [facility]  Mailing [place] [address], [place], [place] [address] Physical Address[address], [place], [place] [address] Toll Free Main # [phone] Fax # [phone] Cell # [phone] [email or web address]

## 2022-09-28 NOTE — Patient Instructions (Signed)
Visit Information  Thank you for taking time to visit with me today. Please don't hesitate to contact me if I can be of assistance to you.   Following are the goals we discussed today:   Goals Addressed               This Visit's Progress     Find Help in My Community. (pt-stated)   On track     Care Coordination Interventions:  Interventions Today    Flowsheet Row Most Recent Value  Chronic Disease   Chronic disease during today's visit Hypertension (HTN), Chronic Obstructive Pulmonary Disease (COPD), Other, Chronic Kidney Disease/End Stage Renal Disease (ESRD)  [Parkinson's Disease, Neuropathy, Recurrent Depression & Generalized Anxiety Disorder]  General Interventions   General Interventions Discussed/Reviewed General Interventions Discussed, General Interventions Reviewed, Annual Eye Exam, Labs, Durable Medical Equipment (DME), Vaccines, Health Screening, Community Resources, Doctor Visits, Level of Care  Labs Hgb A1c annually  Vaccines COVID-19, Flu, Pneumonia, RSV, Shingles  Doctor Visits Discussed/Reviewed Doctor Visits Discussed, Doctor Visits Reviewed, Annual Wellness Visits, PCP, Specialist  Health Screening Colonoscopy, Bone Density, Mammogram  Durable Medical Equipment (DME) BP Cuff, Walker, Psychologist, forensic  PCP/Specialist Visits Compliance with follow-up visit  Level of Care Applications, Assisted Living, Personal Care Counsellor, Personal Care Services, Other  [facility] Health Financial Assistance Application]  Exercise Interventions   Exercise Discussed/Reviewed Exercise Discussed, Exercise Reviewed, Physical Activity, Assistive device use and maintanence  Physical Activity Discussed/Reviewed Physical Activity Discussed, Physical Activity Reviewed, Types of exercise  Education Interventions   Education Provided Provided Therapist, sports, Provided Education, Provided Web-based Education  Provided Verbal Education On Nutrition, Eye  Care, Mental Health/Coping with Illness, Applications, Exercise, Medication, Development worker, community, [employer], When to see the doctor  Applications Medicaid, Personal Care Services, Other  [employer] Health Financial Assistance Application]  Mental Health Interventions   Mental Health Discussed/Reviewed Mental Health Discussed, Mental Health Reviewed, Coping Strategies, Crisis, Anxiety, Depression  Nutrition Interventions   Nutrition Discussed/Reviewed Nutrition Discussed, Nutrition Reviewed, Fluid intake, Portion sizes, Decreasing sugar intake, Decreasing salt, Decreasing fats, Increaing proteins  Pharmacy Interventions   Pharmacy Dicussed/Reviewed Pharmacy Topics Discussed, Pharmacy Topics Reviewed, Medication Adherence, Affording Medications  Safety Interventions   Safety Discussed/Reviewed Safety Discussed, Safety Reviewed, Fall Risk  Advanced Directive Interventions   Advanced Directives Discussed/Reviewed Advanced Directives Discussed, Advanced Directives Reviewed     Active Listening & Reflection Utilized.  Verbalization of Feelings Encouraged.  Emotional Support Provided. Feelings of Frustration Validated. Caregiver Stress Acknowledged. Problem Solving Interventions Implemented. Solution-Focused Strategies Employed. Task-Centered Solutions Activated. CSW Collaboration with Daughter-in-Law, [name] to Confirm Continued Residence at [facility] [phone]), to Receive Short-Term Rehabilitative Services. CSW Collaboration with Daughter-in-Law, [name] to [employer] with Kristin Bruins, Medicaid Case Worker with [facility] Department of Social Services [phone]), to Check Status of Special Assistance Long-Term Care Medicaid Application. CSW Collaboration with Daughter-in-Law, [name] to Confirm Receipt & Thoroughly Review the Following List of Applications, Information & Long-Term Memory Care Assisted  Living Facilities:  ~ 2023 Medicaid Tips  ~ Medicaid Application  ~ Tips for Applying for Medicaid Online  ~ Personal Care Services Application  ~ Personal Care Services Agency Providers  ~ Long-Term Memory Care Assisted Living Facilities CSW Collaboration with Daughter-in-Law, [name] to [employer] with CSW (# [phone]), if She Has Questions, Needs Assistance, or If Additional Social Work Needs Are Identified Between Now & Our Next Scheduled Follow-Up  Outreach Call.      Our next appointment is by telephone on 10/12/2022 at 10:00 am.  Please call the care guide team at [phone] if you need to cancel or reschedule your appointment.   If you are experiencing a Mental Health or Behavioral Health Crisis or need someone to talk to, please call the Suicide and Crisis Lifeline: 988 call the [place] National Suicide Prevention Lifeline: 303-824-3426 or TTY: 907-822-3097 TTY 905 286 4287) to talk to a trained counselor call 1-800-273-TALK (toll free, 24 hour hotline) go to [facility] [address] [place] [phone]) call the [facility] Crisis Line: [phone] call 911  Patient verbalizes understanding of instructions and care plan provided today and agrees to view in MyChart. Active MyChart status and patient understanding of how to access instructions and care plan via MyChart confirmed with patient.     Telephone follow up appointment with care management team member scheduled for:  10/12/2022 at 10:00 am.  Danford Bad, BSW, MSW, LCSW  Licensed Clinical Social Worker  [employer] [facility]  Mailing [place], [place], [place] [address] Physical Address-[address], [place], [place] [address] Toll Free Main # [phone] Fax # [phone] Cell # [phone] [email or web address]

## 2022-09-29 ENCOUNTER — Other Ambulatory Visit: Payer: Self-pay | Admitting: Family Medicine

## 2022-09-30 DIAGNOSIS — F4323 Adjustment disorder with mixed anxiety and depressed mood: Secondary | ICD-10-CM | POA: Diagnosis not present

## 2022-09-30 DIAGNOSIS — F5105 Insomnia due to other mental disorder: Secondary | ICD-10-CM | POA: Diagnosis not present

## 2022-10-02 DIAGNOSIS — R262 Difficulty in walking, not elsewhere classified: Secondary | ICD-10-CM | POA: Diagnosis not present

## 2022-10-02 DIAGNOSIS — R531 Weakness: Secondary | ICD-10-CM | POA: Diagnosis not present

## 2022-10-02 DIAGNOSIS — M6281 Muscle weakness (generalized): Secondary | ICD-10-CM | POA: Diagnosis not present

## 2022-10-07 ENCOUNTER — Other Ambulatory Visit: Payer: Self-pay | Admitting: Family Medicine

## 2022-10-07 DIAGNOSIS — E782 Mixed hyperlipidemia: Secondary | ICD-10-CM

## 2022-10-12 ENCOUNTER — Ambulatory Visit: Payer: Self-pay | Admitting: *Deleted

## 2022-10-12 ENCOUNTER — Encounter: Payer: Self-pay | Admitting: *Deleted

## 2022-10-12 NOTE — Patient Instructions (Signed)
Visit Information  Thank you for taking time to visit with me today. Please don't hesitate to contact me if I can be of assistance to you.   Following are the goals we discussed today:   Goals Addressed               This Visit's Progress     Find Help in My Community. (pt-stated)   On track     Care Coordination Interventions:  Interventions Today    Flowsheet Row Most Recent Value  Chronic Disease   Chronic disease during today's visit Hypertension (HTN), Chronic Obstructive Pulmonary Disease (COPD), Other, Chronic Kidney Disease/End Stage Renal Disease (ESRD)  [Parkinson's Disease, Neuropathy, Recurrent Depression & Generalized Anxiety Disorder]  General Interventions   General Interventions Discussed/Reviewed General Interventions Discussed, General Interventions Reviewed, Annual Eye Exam, Labs, Durable Medical Equipment (DME), Vaccines, Health Screening, Community Resources, Doctor Visits, Level of Care  Labs Hgb A1c annually  Vaccines COVID-19, Flu, Pneumonia, RSV, Shingles  Doctor Visits Discussed/Reviewed Doctor Visits Discussed, Doctor Visits Reviewed, Annual Wellness Visits, PCP, Specialist  Health Screening Colonoscopy, Bone Density, Mammogram  Durable Medical Equipment (DME) BP Cuff, Walker, Psychologist, forensic  PCP/Specialist Visits Compliance with follow-up visit  Level of Care Applications, Assisted Living, Personal Care Counsellor, Personal Care Services, Other  [facility] Health Financial Assistance Application]  Exercise Interventions   Exercise Discussed/Reviewed Exercise Discussed, Exercise Reviewed, Physical Activity, Assistive device use and maintanence  Physical Activity Discussed/Reviewed Physical Activity Discussed, Physical Activity Reviewed, Types of exercise  Education Interventions   Education Provided Provided Therapist, sports, Provided Education, Provided Web-based Education  Provided Verbal Education On Nutrition, Eye  Care, Mental Health/Coping with Illness, Applications, Exercise, Medication, Development worker, community, [employer], When to see the doctor  Applications Medicaid, Personal Care Services, Other  [employer] Health Financial Assistance Application]  Mental Health Interventions   Mental Health Discussed/Reviewed Mental Health Discussed, Mental Health Reviewed, Coping Strategies, Crisis, Anxiety, Depression  Nutrition Interventions   Nutrition Discussed/Reviewed Nutrition Discussed, Nutrition Reviewed, Fluid intake, Portion sizes, Decreasing sugar intake, Decreasing salt, Decreasing fats, Increaing proteins  Pharmacy Interventions   Pharmacy Dicussed/Reviewed Pharmacy Topics Discussed, Pharmacy Topics Reviewed, Medication Adherence, Affording Medications  Safety Interventions   Safety Discussed/Reviewed Safety Discussed, Safety Reviewed, Fall Risk  Advanced Directive Interventions   Advanced Directives Discussed/Reviewed Advanced Directives Discussed, Advanced Directives Reviewed     Active Listening & Reflection Utilized.  Verbalization of Feelings Encouraged.  Emotional Support Provided. Problem Solving Interventions Implemented. Solution-Focused Strategies Activated. Task-Centered Solutions Employed. CSW Collaboration with Daughter-in-Law, [name] to Confirm Patient's Continued Residence at [facility] [phone]), to Receive Short-Term Rehabilitative Services. CSW Collaboration with Daughter-in-Law, [name] to [employer] for Continued Rehab Stay at [facility] [phone]), through [facility]. CSW Collaboration with Kristin Bruins, Medicaid Case Worker with The [facility] Department of Social Services [phone]), to Confirm Active "Spend Down" Process to Be Approved for Special Assistance Long-Term Care Medicaid. CSW Collaboration with Daughter-in-Law, [name] to [employer] with Kristin Bruins, Medicaid Case Worker with The [place] Department of Social Services [phone]), to Periodically Check Status of Special Assistance Long-Term Care Medicaid Application. CSW Collaboration with Daughter-in-Law, [name] to Confirm Interest in Patient Receiving Long-Term Memory Care Assisted Living Facility Placement, Upon Completion of Short-Term Rehabilitative Services & Discharge from [facility] [phone]). CSW Collaboration with Ruffin Pyo, Admissions &  Automotive engineer with [facility] [phone]), to Confirm Agreement to Assist Daughter-in-Law, [name] with Initiating Placement for Patient into a Long-Term Memory Care Assisted Living Facility, Upon Completion of Short-Term Rehabilitative Services & Discharge from [facility] Nursing [facility]. CSW Collaboration with Daughter-in-Law, [name] to Discuss Process for Completing Advance Directives (Living Will & Healthcare Power of Corporate treasurer), Providing Scientist, product/process development & The Following List of Resources, Via Email:  ~ Silvano Rusk, Certified Elder Law Attorney - [employer]    [email or web address] ~ Designer, industrial/product, Haematologist for Nursing Homes https://integritysigners.com/can-mobile-notaries-go-to-nursing-homes-2/   ~ [employer] in [place]  https://www.raismobilenotary.com/notary-services-mobile-notary/[place]  CSW Collaboration with Daughter-in-Law, [name] to [employer] with CSW (# [phone]), if She Has Questions, Needs Assistance, or If Additional Social Work Needs Are Identified Between Now & Our Next Scheduled Follow-Up Outreach Call.      Our next appointment is by telephone on 10/25/2022 at 3:15 pm.  Please call the care guide team at [phone]  if you need to cancel or reschedule your appointment.   If you are experiencing a Mental Health or Behavioral Health Crisis or need someone to talk to, please call the Suicide and Crisis Lifeline: 988 call the [place] National Suicide Prevention Lifeline: 972-090-8357 or TTY: 629 208 6101 TTY 863-358-4704) to talk to a trained counselor call 1-800-273-TALK (toll free, 24 hour hotline) go to [facility] Urgent Care [address] [place] [phone]) call the [facility] Crisis Line: [phone] call 911  Patient verbalizes understanding of instructions and care plan provided today and agrees to view in MyChart. Active MyChart status and patient understanding of how to access instructions and care plan via MyChart confirmed with patient.     Telephone follow up appointment with care management team member scheduled for:  10/25/2022 at 3:15 pm.  Danford Bad, BSW, MSW, LCSW  Licensed Clinical Social Worker  [facility]  Mailing [place]. [address] [place], [place] [address] Physical Address-[address], [place], [place] [address] Toll Free Main # [phone] Fax # [phone] Cell # [phone] [email or web address]

## 2022-10-12 NOTE — Patient Outreach (Signed)
Care Coordination   Follow Up Visit Note   10/12/2022  Name: Allison Rollins MRN: [id number] DOB: 30-Oct-1950  Allison Rollins is a 72 y.o. year old female who sees Gabriel Earing, FNP for primary care. I spoke with Allison Rollins and daughter-in-law, [name] by phone today.  What matters to the patients health and wellness today?  Find Help in My Community.   Goals Addressed               This Visit's Progress     Find Help in My Community. (pt-stated)   On track     Care Coordination Interventions:  Interventions Today    Flowsheet Row Most Recent Value  Chronic Disease   Chronic disease during today's visit Hypertension (HTN), Chronic Obstructive Pulmonary Disease (COPD), Other, Chronic Kidney Disease/End Stage Renal Disease (ESRD)  [Parkinson's Disease, Neuropathy, Recurrent Depression & Generalized Anxiety Disorder]  General Interventions   General Interventions Discussed/Reviewed General Interventions Discussed, General Interventions Reviewed, Annual Eye Exam, Labs, Durable Medical Equipment (DME), Vaccines, Health Screening, Community Resources, Doctor Visits, Level of Care  Labs Hgb A1c annually  Vaccines COVID-19, Flu, Pneumonia, RSV, Shingles  Doctor Visits Discussed/Reviewed Doctor Visits Discussed, Doctor Visits Reviewed, Annual Wellness Visits, PCP, Specialist  Health Screening Colonoscopy, Bone Density, Mammogram  Durable Medical Equipment (DME) BP Cuff, Walker, Psychologist, forensic  PCP/Specialist Visits Compliance with follow-up visit  Level of Care Applications, Assisted Living, Personal Care Counsellor, Personal Care Services, Other  [facility] Health Financial Assistance Application]  Exercise Interventions   Exercise Discussed/Reviewed Exercise Discussed, Exercise Reviewed, Physical Activity, Assistive device use and maintanence  Physical Activity Discussed/Reviewed Physical Activity Discussed, Physical Activity Reviewed, Types  of exercise  Education Interventions   Education Provided Provided Therapist, sports, Provided Education, Provided Web-based Education  Provided Verbal Education On Nutrition, Eye Care, Mental Health/Coping with Illness, Applications, Exercise, Medication, Development worker, community, [employer], When to see the doctor  Applications Medicaid, Personal Care Services, Other  [employer] Health Financial Assistance Application]  Mental Health Interventions   Mental Health Discussed/Reviewed Mental Health Discussed, Mental Health Reviewed, Coping Strategies, Crisis, Anxiety, Depression  Nutrition Interventions   Nutrition Discussed/Reviewed Nutrition Discussed, Nutrition Reviewed, Fluid intake, Portion sizes, Decreasing sugar intake, Decreasing salt, Decreasing fats, Increaing proteins  Pharmacy Interventions   Pharmacy Dicussed/Reviewed Pharmacy Topics Discussed, Pharmacy Topics Reviewed, Medication Adherence, Affording Medications  Safety Interventions   Safety Discussed/Reviewed Safety Discussed, Safety Reviewed, Fall Risk  Advanced Directive Interventions   Advanced Directives Discussed/Reviewed Advanced Directives Discussed, Advanced Directives Reviewed     Active Listening & Reflection Utilized.  Verbalization of Feelings Encouraged.  Emotional Support Provided. Problem Solving Interventions Implemented. Solution-Focused Strategies Activated. Task-Centered Solutions Employed. CSW Collaboration with Daughter-in-Law, [name] to Confirm Patient's Continued Residence at [facility] [phone]), to Receive Short-Term Rehabilitative Services. CSW Collaboration with Daughter-in-Law, [name] to [employer] for Continued Rehab Stay at [facility] [phone]), through [facility]. CSW Collaboration with Kristin Bruins, Medicaid Case Worker with [facility] Department of Social Services [phone]), to Confirm Active "Spend Down" Process to Be Approved for Special Assistance Long-Term Care Medicaid. CSW Collaboration with Daughter-in-Law, [name] to [employer] with Kristin Bruins, Medicaid Case Worker with The [facility] Department of Social Services [phone]), to Periodically Check Status of Special Assistance Long-Term Care Medicaid Application. CSW Collaboration with Daughter-in-Law, [name] to [employer] Interest in  Patient Receiving Long-Term Memory Care Assisted Living Facility Placement, Upon Completion of Short-Term Rehabilitative Services & Discharge from [facility] [phone]). CSW Collaboration with Ruffin Pyo, Admissions & Marketing Director with [facility] [phone]), to Confirm Agreement to Assist Daughter-in-Law, [name] with Initiating Placement for Patient into a Long-Term Memory Care Assisted Living Facility, Upon Completion of Short-Term Rehabilitative Services & Discharge from [facility] Nursing [facility]. CSW Collaboration with Daughter-in-Law, [name] to Discuss Process for Completing Advance Directives (Living Will & Healthcare Power of Corporate treasurer), Providing Scientist, product/process development & The Following List of Resources, Via Email:  ~ Silvano Rusk, Certified Elder Law Attorney - [employer]    [email or web address] ~ Designer, industrial/product, Haematologist for Nursing Homes https://integritysigners.com/can-mobile-notaries-go-to-nursing-homes-2/   ~ [employer] in [place]             https://www.raismobilenotary.com/notary-services-mobile-notary/[place]  CSW Collaboration with Daughter-in-Law, [name] to [employer] with CSW (# [phone]), if She Has Questions, Needs Assistance, or If Additional Social Work Needs  Are Identified Between Now & Our Next Scheduled Follow-Up Outreach Call.      SDOH assessments and interventions completed:  Yes.  Care Coordination Interventions:  Yes, provided.   Follow up plan: Follow up call scheduled for 10/25/2022 at 3:15 pm.  Encounter Outcome:  Pt. Visit Completed.   Danford Bad, BSW, MSW, LCSW  Licensed Restaurant manager, fast food Health System  Mailing [place] [address] [place], [place] [address] Physical Address-[address] [place], [place] [address] Toll Free Main # [phone] Fax # [phone] Cell # [phone] [email or web address]

## 2022-10-15 DIAGNOSIS — F4323 Adjustment disorder with mixed anxiety and depressed mood: Secondary | ICD-10-CM | POA: Diagnosis not present

## 2022-10-21 DIAGNOSIS — J449 Chronic obstructive pulmonary disease, unspecified: Secondary | ICD-10-CM | POA: Diagnosis not present

## 2022-10-21 DIAGNOSIS — J9611 Chronic respiratory failure with hypoxia: Secondary | ICD-10-CM | POA: Diagnosis not present

## 2022-10-21 DIAGNOSIS — I5022 Chronic systolic (congestive) heart failure: Secondary | ICD-10-CM | POA: Diagnosis not present

## 2022-10-21 DIAGNOSIS — Z515 Encounter for palliative care: Secondary | ICD-10-CM | POA: Diagnosis not present

## 2022-10-25 ENCOUNTER — Ambulatory Visit: Payer: Self-pay | Admitting: *Deleted

## 2022-10-25 ENCOUNTER — Encounter: Payer: Self-pay | Admitting: *Deleted

## 2022-10-25 NOTE — Patient Instructions (Signed)
Visit Information  Thank you for taking time to visit with me today. Please don't hesitate to contact me if I can be of assistance to you.   Following are the goals we discussed today:   Goals Addressed               This Visit's Progress     Find Help in My Community. (pt-stated)   On track     Care Coordination Interventions:  Interventions Today    Flowsheet Row Most Recent Value  Chronic Disease   Chronic disease during today's visit Hypertension (HTN), Chronic Obstructive Pulmonary Disease (COPD), Other, Chronic Kidney Disease/End Stage Renal Disease (ESRD)  [Parkinson's Disease, Neuropathy, Recurrent Depression & Generalized Anxiety Disorder]  General Interventions   General Interventions Discussed/Reviewed General Interventions Discussed, General Interventions Reviewed, Annual Eye Exam, Labs, Durable Medical Equipment (DME), Vaccines, Health Screening, Community Resources, Doctor Visits, Level of Care  Labs Hgb A1c annually  Vaccines COVID-19, Flu, Pneumonia, RSV, Shingles  Doctor Visits Discussed/Reviewed Doctor Visits Discussed, Doctor Visits Reviewed, Annual Wellness Visits, PCP, Specialist  Health Screening Colonoscopy, Bone Density, Mammogram  Durable Medical Equipment (DME) BP Cuff, Walker, Psychologist, forensic  PCP/Specialist Visits Compliance with follow-up visit  Level of Care Applications, Assisted Living, Personal Care Counsellor, Personal Care Services, Other  [facility] Health Financial Assistance Application]  Exercise Interventions   Exercise Discussed/Reviewed Exercise Discussed, Exercise Reviewed, Physical Activity, Assistive device use and maintanence  Physical Activity Discussed/Reviewed Physical Activity Discussed, Physical Activity Reviewed, Types of exercise  Education Interventions   Education Provided Provided Therapist, sports, Provided Education, Provided Web-based Education  Provided Verbal Education On Nutrition, Eye  Care, Mental Health/Coping with Illness, Applications, Exercise, Medication, Development worker, community, [employer], When to see the doctor  Applications Medicaid, Personal Care Services, Other  [employer] Health Financial Assistance Application]  Mental Health Interventions   Mental Health Discussed/Reviewed Mental Health Discussed, Mental Health Reviewed, Coping Strategies, Crisis, Anxiety, Depression  Nutrition Interventions   Nutrition Discussed/Reviewed Nutrition Discussed, Nutrition Reviewed, Fluid intake, Portion sizes, Decreasing sugar intake, Decreasing salt, Decreasing fats, Increaing proteins  Pharmacy Interventions   Pharmacy Dicussed/Reviewed Pharmacy Topics Discussed, Pharmacy Topics Reviewed, Medication Adherence, Affording Medications  Safety Interventions   Safety Discussed/Reviewed Safety Discussed, Safety Reviewed, Fall Risk  Advanced Directive Interventions   Advanced Directives Discussed/Reviewed Advanced Directives Discussed, Advanced Directives Reviewed     Active Listening & Reflection Utilized.  Verbalization of Feelings Encouraged.  Emotional Support Provided. Problem Solving Interventions Implemented. Solution-Focused Strategies Activated. Task-Centered Solutions Employed. CSW Collaboration with Daughter-in-Law, [name] to Confirm Patient's Continued Residence at [facility] [phone]), to Receive Rehabilitative Services. CSW Collaboration with Daughter-in-Law, [name] to Confirm Completion of "Spend Down" Process & Approval of Special Assistance Long-Term Care Medicaid, through [facility] of Social Services [phone]). CSW Collaboration with Kristin Bruins, Medicaid Case Worker with The [facility] Department of Social Services [phone]), to Confirm Contact with Ruffin Pyo, Admissions & Marketing Director at [facility] [phone]),  to Negotiate Rate of Pay Under Special Assistance Long-Term Care Medicaid. CSW Collaboration with Daughter-in-Law, [name] to [employer] Implementation of Process for Completing Advance Directives (Living Will & Healthcare Power of Attorney Documents), & Encouraged Use of The Following Resources: ~ Silvano Rusk, Certified Elder Law Attorney - [employer]   [email or web address]         ~ Designer, industrial/product, Haematologist for Nursing Homes  https://integritysigners.com/can-mobile-notaries-go-to-nursing-homes-2/  ~ [employer] in [place] https://www.raismobilenotary.com/notary-services-mobile-notary/[place]  CSW Collaboration with Daughter-in-Law, [name] to [employer] with CSW (# [phone]), if She Has Questions, Needs Assistance, or If Additional Social Work Needs Are Identified Between Now & Our Next Scheduled Follow-Up Outreach Call.      Our next appointment is by telephone on 11/08/2022 at 1:30 pm.  Please call the care guide team at [phone] if you need to cancel or reschedule your appointment.   If you are experiencing a Mental Health or Behavioral Health Crisis or need someone to talk to, please call the Suicide and Crisis Lifeline: 988 call the [place] National Suicide Prevention Lifeline: 878-627-4120 or TTY: (613)319-1045 TTY 602-792-3883) to talk to a trained counselor call 1-800-273-TALK (toll free, 24 hour hotline) go to [facility] Urgent Care [address] [place] [phone]) call the [facility] Crisis Line: [phone] call 911  Patient verbalizes understanding of instructions and care plan provided today and agrees to view in MyChart. Active MyChart status and patient understanding of how to access instructions and care plan via MyChart confirmed with patient.     Telephone follow up appointment with care management team member  scheduled for: 11/08/2022 at 1:30 pm.  Danford Bad, BSW, MSW, LCSW  Licensed Clinical Social Worker  [facility]  Mailing [place]. [address], [place], [place] [address] Physical Address-[address] [place], [place] [address] Toll Free Main # [phone] Fax # [phone] Cell # [phone] [email or web address]

## 2022-10-25 NOTE — Patient Outreach (Signed)
Care Coordination   Follow Up Visit Note   10/25/2022  Name: Allison Rollins MRN: [id number] DOB: [birth year]  Allison Rollins is a 72 y.o. year old female who sees Gabriel Earing, FNP for primary care. I spoke with patient's daughter-in-law, [name] by phone today.  What matters to the patients health and wellness today?  Find Help in My Community.   Goals Addressed               This Visit's Progress     Find Help in My Community. (pt-stated)   On track     Care Coordination Interventions:  Interventions Today    Flowsheet Row Most Recent Value  Chronic Disease   Chronic disease during today's visit Hypertension (HTN), Chronic Obstructive Pulmonary Disease (COPD), Other, Chronic Kidney Disease/End Stage Renal Disease (ESRD)  [Parkinson's Disease, Neuropathy, Recurrent Depression & Generalized Anxiety Disorder]  General Interventions   General Interventions Discussed/Reviewed General Interventions Discussed, General Interventions Reviewed, Annual Eye Exam, Labs, Durable Medical Equipment (DME), Vaccines, Health Screening, Community Resources, Doctor Visits, Level of Care  Labs Hgb A1c annually  Vaccines COVID-19, Flu, Pneumonia, RSV, Shingles  Doctor Visits Discussed/Reviewed Doctor Visits Discussed, Doctor Visits Reviewed, Annual Wellness Visits, PCP, Specialist  Health Screening Colonoscopy, Bone Density, Mammogram  Durable Medical Equipment (DME) BP Cuff, Walker, Psychologist, forensic  PCP/Specialist Visits Compliance with follow-up visit  Level of Care Applications, Assisted Living, Personal Care Counsellor, Personal Care Services, Other  [facility] Health Financial Assistance Application]  Exercise Interventions   Exercise Discussed/Reviewed Exercise Discussed, Exercise Reviewed, Physical Activity, Assistive device use and maintanence  Physical Activity Discussed/Reviewed Physical Activity Discussed, Physical Activity Reviewed, Types of  exercise  Education Interventions   Education Provided Provided Therapist, sports, Provided Education, Provided Web-based Education  Provided Verbal Education On Nutrition, Eye Care, Mental Health/Coping with Illness, Applications, Exercise, Medication, Development worker, community, [employer], When to see the doctor  Applications Medicaid, Personal Care Services, Other  [employer] Health Financial Assistance Application]  Mental Health Interventions   Mental Health Discussed/Reviewed Mental Health Discussed, Mental Health Reviewed, Coping Strategies, Crisis, Anxiety, Depression  Nutrition Interventions   Nutrition Discussed/Reviewed Nutrition Discussed, Nutrition Reviewed, Fluid intake, Portion sizes, Decreasing sugar intake, Decreasing salt, Decreasing fats, Increaing proteins  Pharmacy Interventions   Pharmacy Dicussed/Reviewed Pharmacy Topics Discussed, Pharmacy Topics Reviewed, Medication Adherence, Affording Medications  Safety Interventions   Safety Discussed/Reviewed Safety Discussed, Safety Reviewed, Fall Risk  Advanced Directive Interventions   Advanced Directives Discussed/Reviewed Advanced Directives Discussed, Advanced Directives Reviewed     Active Listening & Reflection Utilized.  Verbalization of Feelings Encouraged.  Emotional Support Provided. Problem Solving Interventions Implemented. Solution-Focused Strategies Activated. Task-Centered Solutions Employed. CSW Collaboration with Daughter-in-Law, [name] to Confirm Patient's Continued Residence at [facility] [phone]), to Receive Rehabilitative Services. CSW Collaboration with Daughter-in-Law, [name] to Confirm Completion of "Spend Down" Process & Approval of Special Assistance Long-Term Care Medicaid, through [facility] of Social Services [phone]). CSW Collaboration with Kristin Bruins, Medicaid Case Worker with [facility] Department of  Social Services [phone]), to Confirm Contact with Ruffin Pyo, Admissions & Marketing Director at [facility] [phone]), to Negotiate Rate of Pay Under Special Assistance Long-Term Care Medicaid. CSW Collaboration with Daughter-in-Law, [name] to [employer] Implementation of Process for Completing Advance Directives (Living Will & Healthcare Power of Attorney Documents), & Encouraged Use of The Following Resources: ~  Silvano Rusk, Certified Elder Law Attorney - [employer]   [email or web address]         ~ Designer, industrial/product, Haematologist for Nursing Homes                https://integritysigners.com/can-mobile-notaries-[email or web address]-nursing-homes-2/  ~ [employer] in [place] https://www.raismobilenotary.com/notary-services-mobile-notary/[place]  CSW Collaboration with Daughter-in-Law, [name] to [employer] with CSW (# [phone]), if She Has Questions, Needs Assistance, or If Additional Social Work Needs Are Identified Between Now & Our Next Scheduled Follow-Up Outreach Call.      SDOH assessments and interventions completed:  Yes.  Care Coordination Interventions:  Yes, provided.   Follow up plan: Follow up call scheduled for 11/08/2022 at 1:30 pm.  Encounter Outcome:  Pt. Visit Completed.   Danford Bad, BSW, MSW, LCSW  Licensed Restaurant manager, fast food Health System  Mailing [place] [address] [place], [place] [address] Physical Address[address], [place], [place] [address] Toll Free Main # [phone] Fax # [phone] Cell # [phone] [email or web address]

## 2022-10-27 DIAGNOSIS — F4320 Adjustment disorder, unspecified: Secondary | ICD-10-CM | POA: Diagnosis not present

## 2022-10-27 DIAGNOSIS — F32A Depression, unspecified: Secondary | ICD-10-CM | POA: Diagnosis not present

## 2022-10-27 DIAGNOSIS — F413 Other mixed anxiety disorders: Secondary | ICD-10-CM | POA: Diagnosis not present

## 2022-10-27 DIAGNOSIS — F5105 Insomnia due to other mental disorder: Secondary | ICD-10-CM | POA: Diagnosis not present

## 2022-10-27 DIAGNOSIS — R441 Visual hallucinations: Secondary | ICD-10-CM | POA: Diagnosis not present

## 2022-11-01 DIAGNOSIS — R262 Difficulty in walking, not elsewhere classified: Secondary | ICD-10-CM | POA: Diagnosis not present

## 2022-11-01 DIAGNOSIS — R531 Weakness: Secondary | ICD-10-CM | POA: Diagnosis not present

## 2022-11-01 DIAGNOSIS — M6281 Muscle weakness (generalized): Secondary | ICD-10-CM | POA: Diagnosis not present

## 2022-11-03 DIAGNOSIS — R441 Visual hallucinations: Secondary | ICD-10-CM | POA: Diagnosis not present

## 2022-11-03 DIAGNOSIS — F5105 Insomnia due to other mental disorder: Secondary | ICD-10-CM | POA: Diagnosis not present

## 2022-11-03 DIAGNOSIS — F413 Other mixed anxiety disorders: Secondary | ICD-10-CM | POA: Diagnosis not present

## 2022-11-03 DIAGNOSIS — F4323 Adjustment disorder with mixed anxiety and depressed mood: Secondary | ICD-10-CM | POA: Diagnosis not present

## 2022-11-03 DIAGNOSIS — F32A Depression, unspecified: Secondary | ICD-10-CM | POA: Diagnosis not present

## 2022-11-03 DIAGNOSIS — F4320 Adjustment disorder, unspecified: Secondary | ICD-10-CM | POA: Diagnosis not present

## 2022-11-05 ENCOUNTER — Telehealth: Payer: Self-pay

## 2022-11-05 NOTE — Telephone Encounter (Signed)
ERROR

## 2022-11-08 ENCOUNTER — Ambulatory Visit: Payer: Self-pay | Admitting: *Deleted

## 2022-11-08 ENCOUNTER — Other Ambulatory Visit: Payer: Self-pay | Admitting: Family Medicine

## 2022-11-08 ENCOUNTER — Encounter: Payer: Self-pay | Admitting: *Deleted

## 2022-11-08 DIAGNOSIS — E782 Mixed hyperlipidemia: Secondary | ICD-10-CM

## 2022-11-08 DIAGNOSIS — G2581 Restless legs syndrome: Secondary | ICD-10-CM

## 2022-11-08 NOTE — Patient Outreach (Signed)
Care Coordination   Follow Up Visit Note   11/08/2022  Name: Allison Rollins MRN: [id number] DOB: [birth year]  Allison Rollins is a 72 y.o. year old female who sees Gabriel Earing, FNP for primary care. I spoke with patient's daughter-in-law, [name] by phone today.  What matters to the patients health and wellness today?  Find Help in My Community.   Goals Addressed               This Visit's Progress     Find Help in My Community. (pt-stated)   On track     Care Coordination Interventions:  Interventions Today    Flowsheet Row Most Recent Value  Chronic Disease   Chronic disease during today's visit Hypertension (HTN), Chronic Obstructive Pulmonary Disease (COPD), Other, Chronic Kidney Disease/End Stage Renal Disease (ESRD)  [Parkinson's Disease, Neuropathy, Recurrent Depression & Generalized Anxiety Disorder]  General Interventions   General Interventions Discussed/Reviewed General Interventions Discussed, General Interventions Reviewed, Annual Eye Exam, Labs, Durable Medical Equipment (DME), Vaccines, Health Screening, Community Resources, Doctor Visits, Level of Care  Labs Hgb A1c annually  Vaccines COVID-19, Flu, Pneumonia, RSV, Shingles  Doctor Visits Discussed/Reviewed Doctor Visits Discussed, Doctor Visits Reviewed, Annual Wellness Visits, PCP, Specialist  Health Screening Colonoscopy, Bone Density, Mammogram  Durable Medical Equipment (DME) BP Cuff, Walker, Psychologist, forensic  PCP/Specialist Visits Compliance with follow-up visit  Level of Care Applications, Assisted Living, Personal Care Counsellor, Personal Care Services, Other  [facility] Health Financial Assistance Application]  Exercise Interventions   Exercise Discussed/Reviewed Exercise Discussed, Exercise Reviewed, Physical Activity, Assistive device use and maintanence  Physical Activity Discussed/Reviewed Physical Activity Discussed, Physical Activity Reviewed, Types of  exercise  Education Interventions   Education Provided Provided Therapist, sports, Provided Education, Provided Web-based Education  Provided Verbal Education On Nutrition, Eye Care, Mental Health/Coping with Illness, Applications, Exercise, Medication, Development worker, community, [employer], When to see the doctor  Applications Medicaid, Personal Care Services, Other  [employer] Health Financial Assistance Application]  Mental Health Interventions   Mental Health Discussed/Reviewed Mental Health Discussed, Mental Health Reviewed, Coping Strategies, Crisis, Anxiety, Depression  Nutrition Interventions   Nutrition Discussed/Reviewed Nutrition Discussed, Nutrition Reviewed, Fluid intake, Portion sizes, Decreasing sugar intake, Decreasing salt, Decreasing fats, Increaing proteins  Pharmacy Interventions   Pharmacy Dicussed/Reviewed Pharmacy Topics Discussed, Pharmacy Topics Reviewed, Medication Adherence, Affording Medications  Safety Interventions   Safety Discussed/Reviewed Safety Discussed, Safety Reviewed, Fall Risk  Advanced Directive Interventions   Advanced Directives Discussed/Reviewed Advanced Directives Discussed, Advanced Directives Reviewed      Active Listening & Reflection Utilized.  Verbalization of Feelings Encouraged.  Emotional Support Provided. Problem Solving Interventions Implemented. Solution-Focused Strategies Activated. Task-Centered Solutions Employed. CSW Collaboration with Daughter-in-Law, [name] to Confirm Patient's Continued Residence at [facility] [phone]), to [facility]. CSW Collaboration with Daughter-in-Law, [name] to Confirm Patient's Approval for Special Assistance Long-Term Care Medicaid, through The [facility] of Social Services [phone]). CSW Collaboration with Kristin Bruins, Medicaid Case Worker with [facility] Department of Social Services [phone]), to Confirm Contact with Ruffin Pyo, Admissions & Marketing Director at [facility] [phone]), to Negotiate Rate of Pay Under Special Assistance Long-Term Care Medicaid. CSW Collaboration with Daughter-in-Law, [name] to [employer] Implementation of Process for Completing Advance Directives (Living Will & Healthcare Power of Attorney Documents). CSW Collaboration with Daughter-in-Law, [name] to [employer] of Completed,  Signed & Notarized Advanced Directives (Living Will & Healthcare Power of Attorney Documents) to Primary Care Provider, Family Nurse Practitioner, Harlow Mares with [facility] [phone]) to Scan into Electronic Medical Record in Epic. CSW Collaboration with Daughter-in-Law, [name] to [employer] with CSW [phone]# [phone]), if She Has Questions, Needs Assistance, or If Additional Social Work Needs Are Identified Between Now & Our Next Scheduled Follow-Up Outreach Call.      SDOH assessments and interventions completed:  Yes.  Care Coordination Interventions:  Yes, provided.   Follow up plan: Follow up call scheduled for 11/23/2022 at 10:15 am.  Encounter Outcome:  Pt. Visit Completed.   Danford Bad, BSW, MSW, LCSW  Licensed Restaurant manager, fast food [facility] System  Mailing [place] [address] [place], [place] [address] Physical Address-[address] [place], [place] [address] Toll Free Main # [phone] Fax # [phone] Cell # [phone] [email or web address]

## 2022-11-08 NOTE — Patient Instructions (Signed)
Visit Information  Thank you for taking time to visit with me today. Please don't hesitate to contact me if I can be of assistance to you.   Following are the goals we discussed today:   Goals Addressed               This Visit's Progress     Find Help in My Community. (pt-stated)   On track     Care Coordination Interventions:  Interventions Today    Flowsheet Row Most Recent Value  Chronic Disease   Chronic disease during today's visit Hypertension (HTN), Chronic Obstructive Pulmonary Disease (COPD), Other, Chronic Kidney Disease/End Stage Renal Disease (ESRD)  [Parkinson's Disease, Neuropathy, Recurrent Depression & Generalized Anxiety Disorder]  General Interventions   General Interventions Discussed/Reviewed General Interventions Discussed, General Interventions Reviewed, Annual Eye Exam, Labs, Durable Medical Equipment (DME), Vaccines, Health Screening, Community Resources, Doctor Visits, Level of Care  Labs Hgb A1c annually  Vaccines COVID-19, Flu, Pneumonia, RSV, Shingles  Doctor Visits Discussed/Reviewed Doctor Visits Discussed, Doctor Visits Reviewed, Annual Wellness Visits, PCP, Specialist  Health Screening Colonoscopy, Bone Density, Mammogram  Durable Medical Equipment (DME) BP Cuff, Walker, Psychologist, forensic  PCP/Specialist Visits Compliance with follow-up visit  Level of Care Applications, Assisted Living, Personal Care Counsellor, Personal Care Services, Other  [facility] Health Financial Assistance Application]  Exercise Interventions   Exercise Discussed/Reviewed Exercise Discussed, Exercise Reviewed, Physical Activity, Assistive device use and maintanence  Physical Activity Discussed/Reviewed Physical Activity Discussed, Physical Activity Reviewed, Types of exercise  Education Interventions   Education Provided Provided Therapist, sports, Provided Education, Provided Web-based Education  Provided Verbal Education On Nutrition, Eye  Care, Mental Health/Coping with Illness, Applications, Exercise, Medication, Development worker, community, [employer], When to see the doctor  Applications Medicaid, Personal Care Services, Other  [employer] Health Financial Assistance Application]  Mental Health Interventions   Mental Health Discussed/Reviewed Mental Health Discussed, Mental Health Reviewed, Coping Strategies, Crisis, Anxiety, Depression  Nutrition Interventions   Nutrition Discussed/Reviewed Nutrition Discussed, Nutrition Reviewed, Fluid intake, Portion sizes, Decreasing sugar intake, Decreasing salt, Decreasing fats, Increaing proteins  Pharmacy Interventions   Pharmacy Dicussed/Reviewed Pharmacy Topics Discussed, Pharmacy Topics Reviewed, Medication Adherence, Affording Medications  Safety Interventions   Safety Discussed/Reviewed Safety Discussed, Safety Reviewed, Fall Risk  Advanced Directive Interventions   Advanced Directives Discussed/Reviewed Advanced Directives Discussed, Advanced Directives Reviewed      Active Listening & Reflection Utilized.  Verbalization of Feelings Encouraged.  Emotional Support Provided. Problem Solving Interventions Implemented. Solution-Focused Strategies Activated. Task-Centered Solutions Employed. CSW Collaboration with Daughter-in-Law, [name] to Confirm Patient's Continued Residence at [facility] [phone]), to [facility]. CSW Collaboration with Daughter-in-Law, [name] to Confirm Patient's Approval for Special Assistance Long-Term Care Medicaid, through [facility] of Social Services [phone]). CSW Collaboration with Kristin Bruins, Medicaid Case Worker with [facility] Department of Social Services [phone]), to Confirm Contact with Ruffin Pyo, Admissions & Marketing Director at [facility] [phone]), to Negotiate Rate of Pay  Under Special Assistance Long-Term Care Medicaid. CSW Collaboration with Daughter-in-Law, [name] to [employer] Implementation of Process for Completing Advance Directives (Living Will & Healthcare Power of Attorney Documents). CSW Collaboration with Daughter-in-Law, [name] to [employer] of Completed, Signed & Notarized Designer, industrial/product (Living Will Microbiologist) to Primary Care Provider, Family Nurse Practitioner, Harlow Mares with [facility] (#  [phone]) to Scan into Electronic Medical Record in Epic. CSW Collaboration with Daughter-in-Law, [name] to [employer] with CSW [phone]# [phone]), if She Has Questions, Needs Assistance, or If Additional Social Work Needs Are Identified Between Now & Our Next Scheduled Follow-Up Outreach Call.      Our next appointment is by telephone on 11/23/2022 at 10:15 am.  Please call the care guide team at [phone] if you need to cancel or reschedule your appointment.   If you are experiencing a Mental Health or Behavioral Health Crisis or need someone to talk to, please call the Suicide and Crisis Lifeline: 988 call the [place] National Suicide Prevention Lifeline: 669-090-7272 or TTY: 234-632-2522 TTY 508-301-4489) to talk to a trained counselor call 1-800-273-TALK (toll free, 24 hour hotline) go to [facility] [address], [place] [phone]) call the [facility] Crisis Line: [phone] call 911  Patient verbalizes understanding of instructions and care plan provided today and agrees to view in MyChart. Active MyChart status and patient understanding of how to access instructions and care plan via MyChart confirmed with patient.     Telephone follow up appointment with care management team member scheduled for:  11/23/2022 at 10:15 am.  Danford Bad, BSW, MSW, LCSW  Licensed Clinical Social  Worker  [employer] [facility]  Mailing [place], [place], [place] [address] Physical Address[address] [place], [place] [address] Toll Free Main # [phone] Fax # [phone] Cell # [phone] [email or web address]

## 2022-11-11 ENCOUNTER — Telehealth (HOSPITAL_COMMUNITY): Payer: Self-pay | Admitting: Vascular Surgery

## 2022-11-11 NOTE — Telephone Encounter (Signed)
Pt son states pt is in Rehab ,has been there for the past 2 month

## 2022-11-17 DIAGNOSIS — I110 Hypertensive heart disease with heart failure: Secondary | ICD-10-CM | POA: Diagnosis not present

## 2022-11-17 DIAGNOSIS — I517 Cardiomegaly: Secondary | ICD-10-CM | POA: Diagnosis not present

## 2022-11-17 DIAGNOSIS — Z91012 Allergy to eggs: Secondary | ICD-10-CM | POA: Diagnosis not present

## 2022-11-17 DIAGNOSIS — J811 Chronic pulmonary edema: Secondary | ICD-10-CM | POA: Diagnosis not present

## 2022-11-17 DIAGNOSIS — M419 Scoliosis, unspecified: Secondary | ICD-10-CM | POA: Diagnosis not present

## 2022-11-17 DIAGNOSIS — R6889 Other general symptoms and signs: Secondary | ICD-10-CM | POA: Diagnosis not present

## 2022-11-17 DIAGNOSIS — J9612 Chronic respiratory failure with hypercapnia: Secondary | ICD-10-CM | POA: Diagnosis not present

## 2022-11-17 DIAGNOSIS — Z8679 Personal history of other diseases of the circulatory system: Secondary | ICD-10-CM | POA: Diagnosis not present

## 2022-11-17 DIAGNOSIS — G20A1 Parkinson's disease without dyskinesia, without mention of fluctuations: Secondary | ICD-10-CM | POA: Diagnosis not present

## 2022-11-17 DIAGNOSIS — Z743 Need for continuous supervision: Secondary | ICD-10-CM | POA: Diagnosis not present

## 2022-11-17 DIAGNOSIS — M1611 Unilateral primary osteoarthritis, right hip: Secondary | ICD-10-CM | POA: Diagnosis not present

## 2022-11-17 DIAGNOSIS — J449 Chronic obstructive pulmonary disease, unspecified: Secondary | ICD-10-CM | POA: Diagnosis not present

## 2022-11-17 DIAGNOSIS — R519 Headache, unspecified: Secondary | ICD-10-CM | POA: Diagnosis not present

## 2022-11-17 DIAGNOSIS — I951 Orthostatic hypotension: Secondary | ICD-10-CM | POA: Diagnosis not present

## 2022-11-17 DIAGNOSIS — I499 Cardiac arrhythmia, unspecified: Secondary | ICD-10-CM | POA: Diagnosis not present

## 2022-11-17 DIAGNOSIS — R7989 Other specified abnormal findings of blood chemistry: Secondary | ICD-10-CM | POA: Diagnosis not present

## 2022-11-17 DIAGNOSIS — Z7901 Long term (current) use of anticoagulants: Secondary | ICD-10-CM | POA: Diagnosis not present

## 2022-11-17 DIAGNOSIS — J9622 Acute and chronic respiratory failure with hypercapnia: Secondary | ICD-10-CM | POA: Diagnosis not present

## 2022-11-17 DIAGNOSIS — Z792 Long term (current) use of antibiotics: Secondary | ICD-10-CM | POA: Diagnosis not present

## 2022-11-17 DIAGNOSIS — J9611 Chronic respiratory failure with hypoxia: Secondary | ICD-10-CM | POA: Diagnosis not present

## 2022-11-17 DIAGNOSIS — M5136 Other intervertebral disc degeneration, lumbar region: Secondary | ICD-10-CM | POA: Diagnosis not present

## 2022-11-17 DIAGNOSIS — M25551 Pain in right hip: Secondary | ICD-10-CM | POA: Diagnosis not present

## 2022-11-17 DIAGNOSIS — Z887 Allergy status to serum and vaccine status: Secondary | ICD-10-CM | POA: Diagnosis not present

## 2022-11-17 DIAGNOSIS — I4589 Other specified conduction disorders: Secondary | ICD-10-CM | POA: Diagnosis not present

## 2022-11-17 DIAGNOSIS — S0003XA Contusion of scalp, initial encounter: Secondary | ICD-10-CM | POA: Diagnosis not present

## 2022-11-17 DIAGNOSIS — N3000 Acute cystitis without hematuria: Secondary | ICD-10-CM | POA: Diagnosis not present

## 2022-11-17 DIAGNOSIS — S0083XA Contusion of other part of head, initial encounter: Secondary | ICD-10-CM | POA: Diagnosis not present

## 2022-11-17 DIAGNOSIS — R011 Cardiac murmur, unspecified: Secondary | ICD-10-CM | POA: Diagnosis not present

## 2022-11-17 DIAGNOSIS — Z880 Allergy status to penicillin: Secondary | ICD-10-CM | POA: Diagnosis not present

## 2022-11-17 DIAGNOSIS — R0902 Hypoxemia: Secondary | ICD-10-CM | POA: Diagnosis not present

## 2022-11-17 DIAGNOSIS — K5900 Constipation, unspecified: Secondary | ICD-10-CM | POA: Diagnosis not present

## 2022-11-17 DIAGNOSIS — R531 Weakness: Secondary | ICD-10-CM | POA: Diagnosis not present

## 2022-11-17 DIAGNOSIS — I482 Chronic atrial fibrillation, unspecified: Secondary | ICD-10-CM | POA: Diagnosis not present

## 2022-11-17 DIAGNOSIS — B9620 Unspecified Escherichia coli [E. coli] as the cause of diseases classified elsewhere: Secondary | ICD-10-CM | POA: Diagnosis not present

## 2022-11-17 DIAGNOSIS — Z79899 Other long term (current) drug therapy: Secondary | ICD-10-CM | POA: Diagnosis not present

## 2022-11-17 DIAGNOSIS — I10 Essential (primary) hypertension: Secondary | ICD-10-CM | POA: Diagnosis not present

## 2022-11-17 DIAGNOSIS — S0990XA Unspecified injury of head, initial encounter: Secondary | ICD-10-CM | POA: Diagnosis not present

## 2022-11-17 DIAGNOSIS — R918 Other nonspecific abnormal finding of lung field: Secondary | ICD-10-CM | POA: Diagnosis not present

## 2022-11-17 DIAGNOSIS — I5042 Chronic combined systolic (congestive) and diastolic (congestive) heart failure: Secondary | ICD-10-CM | POA: Diagnosis not present

## 2022-11-17 DIAGNOSIS — R262 Difficulty in walking, not elsewhere classified: Secondary | ICD-10-CM | POA: Diagnosis not present

## 2022-11-17 DIAGNOSIS — Z882 Allergy status to sulfonamides status: Secondary | ICD-10-CM | POA: Diagnosis not present

## 2022-11-17 DIAGNOSIS — R410 Disorientation, unspecified: Secondary | ICD-10-CM | POA: Diagnosis not present

## 2022-11-17 DIAGNOSIS — G2581 Restless legs syndrome: Secondary | ICD-10-CM | POA: Diagnosis not present

## 2022-11-17 DIAGNOSIS — M549 Dorsalgia, unspecified: Secondary | ICD-10-CM | POA: Diagnosis not present

## 2022-11-17 DIAGNOSIS — Z7951 Long term (current) use of inhaled steroids: Secondary | ICD-10-CM | POA: Diagnosis not present

## 2022-11-17 DIAGNOSIS — W19XXXA Unspecified fall, initial encounter: Secondary | ICD-10-CM | POA: Diagnosis not present

## 2022-11-17 DIAGNOSIS — R0989 Other specified symptoms and signs involving the circulatory and respiratory systems: Secondary | ICD-10-CM | POA: Diagnosis not present

## 2022-11-17 DIAGNOSIS — M533 Sacrococcygeal disorders, not elsewhere classified: Secondary | ICD-10-CM | POA: Diagnosis not present

## 2022-11-17 DIAGNOSIS — S3210XA Unspecified fracture of sacrum, initial encounter for closed fracture: Secondary | ICD-10-CM | POA: Diagnosis not present

## 2022-11-17 DIAGNOSIS — R9431 Abnormal electrocardiogram [ECG] [EKG]: Secondary | ICD-10-CM | POA: Diagnosis not present

## 2022-11-17 DIAGNOSIS — M6281 Muscle weakness (generalized): Secondary | ICD-10-CM | POA: Diagnosis not present

## 2022-11-17 DIAGNOSIS — Z9981 Dependence on supplemental oxygen: Secondary | ICD-10-CM | POA: Diagnosis not present

## 2022-11-17 DIAGNOSIS — Z66 Do not resuscitate: Secondary | ICD-10-CM | POA: Diagnosis not present

## 2022-11-17 DIAGNOSIS — I440 Atrioventricular block, first degree: Secondary | ICD-10-CM | POA: Diagnosis not present

## 2022-11-17 DIAGNOSIS — M545 Low back pain, unspecified: Secondary | ICD-10-CM | POA: Diagnosis not present

## 2022-11-17 DIAGNOSIS — Z888 Allergy status to other drugs, medicaments and biological substances status: Secondary | ICD-10-CM | POA: Diagnosis not present

## 2022-11-17 DIAGNOSIS — I4891 Unspecified atrial fibrillation: Secondary | ICD-10-CM | POA: Diagnosis not present

## 2022-11-17 DIAGNOSIS — S3210XD Unspecified fracture of sacrum, subsequent encounter for fracture with routine healing: Secondary | ICD-10-CM | POA: Diagnosis not present

## 2022-11-17 DIAGNOSIS — W19XXXD Unspecified fall, subsequent encounter: Secondary | ICD-10-CM | POA: Diagnosis not present

## 2022-11-17 DIAGNOSIS — F4323 Adjustment disorder with mixed anxiety and depressed mood: Secondary | ICD-10-CM | POA: Diagnosis not present

## 2022-11-17 DIAGNOSIS — R001 Bradycardia, unspecified: Secondary | ICD-10-CM | POA: Diagnosis not present

## 2022-11-17 DIAGNOSIS — R079 Chest pain, unspecified: Secondary | ICD-10-CM | POA: Diagnosis not present

## 2022-11-17 DIAGNOSIS — J9621 Acute and chronic respiratory failure with hypoxia: Secondary | ICD-10-CM | POA: Diagnosis not present

## 2022-11-17 DIAGNOSIS — S0003XD Contusion of scalp, subsequent encounter: Secondary | ICD-10-CM | POA: Diagnosis not present

## 2022-11-18 DIAGNOSIS — R001 Bradycardia, unspecified: Secondary | ICD-10-CM | POA: Diagnosis not present

## 2022-11-19 ENCOUNTER — Encounter: Payer: Self-pay | Admitting: Nurse Practitioner

## 2022-11-19 ENCOUNTER — Ambulatory Visit: Payer: Medicare HMO | Admitting: Nurse Practitioner

## 2022-11-19 NOTE — Progress Notes (Deleted)
Office Visit    Patient Name: Allison Rollins Date of Encounter: 09/10/2022  PCP:  Gabriel Earing, FNP   [facility]  Cardiologist:  Dina Rich, MD  Advanced Practice Provider:  No care team member to display Electrophysiologist:  None   Chief Complaint    Allison Rollins is a 72 y.o. female with a hx of A-fib, chronic diastolic CHF, COPD, hypertension, hyperlipidemia, MR, orthostatic hypotension, history of presyncope/fall, Parkinson's disease, who presents today for hospital follow-up.  Past Medical History    Past Medical History:  Diagnosis Date   Anxiety    Atrial fibrillation (HCC) 2023   Eliquis   Chest pain 12/22/2015   CHF (congestive heart failure) (HCC)    COPD (chronic obstructive pulmonary disease) (HCC)    Depression    Headache    HTN (hypertension)    Hypokalemia 07/28/2013   Kidney stones    Osteopenia 01/22/2020   Parkinson's disease    Parkinsonian syndrome associated with symptomatic orthostatic hypotension    Midodrine   Pneumonia    Pre-diabetes    Requires continuous at home supplemental oxygen    3L [place]   S/P colonoscopy 10/15/2004   normal   Vitamin D deficiency 11/24/2018   Past Surgical History:  Procedure Laterality Date   CHOLECYSTECTOMY     COLONOSCOPY  10/15/2004   Normal rectum/Normal colon   COLONOSCOPY N/A 05/10/2016   Procedure: COLONOSCOPY;  Surgeon: West Bali, MD;  [facility];  Service: Endoscopy;  Laterality: N/A;  10:30   ESOPHAGEAL MANOMETRY N/A 04/02/2019   Procedure: ESOPHAGEAL MANOMETRY (EM);  Surgeon: Napoleon Form, MD;  Location: [facility];  Service: Endoscopy;  Laterality: N/A;   ESOPHAGOGASTRODUODENOSCOPY  09/11/2010   Dysphagia likely multifactorial (possible Candida esophagitis, likely nonspecific esophageal motility disorder, and/or uncontrolled gastroesophageal reflux disease), status post empiric dilation   ESOPHAGOGASTRODUODENOSCOPY N/A 11/13/2018   Dr.  Darrick Penna: No endoscopic esophageal abnormality to explain patient's dysphagia.  Esophagus dilated.  Moderate erosive/nodular gastritis with benign biopsies.   KIDNEY STONE SURGERY     SAVORY DILATION N/A 11/13/2018   Procedure: SAVORY DILATION;  Surgeon: West Bali, MD;  Location: [facility];  Service: Endoscopy;  Laterality: N/A;    Allergies  Allergies  Allergen Reactions   Egg Solids, Whole    Farxiga [Dapagliflozin] Swelling   Penicillins Itching and Rash    Has patient had a PCN reaction causing immediate rash, facial/tongue/throat swelling, SOB or lightheadedness with hypotension: no Has patient had a PCN reaction causing severe rash involving mucus membranes or skin necrosis: no Has patient had a PCN reaction that required hospitalization: no Has patient had a PCN reaction occurring within the last 10 years: no If all of the above answers are "NO", then may proceed with Cephalosporin use.    Sulfa Antibiotics Rash    History of Present Illness    Allison Rollins is a 72 y.o. female with a PMH as mentioned above.  ER visit in April 2023 with presyncope and chest pain.  CT angio chest was negative for PE.  Was admitted in May 2023 due to fall, tested positive for UTI.  Echocardiogram in July 2023 revealed EF 50 to 55%, mild to moderate MR, normal RV, no wall motion abnormalities.  Last seen by Dr. Dina Rich on April 23, 2022.  Was overall doing well, denied any recent edema, LOC, or presyncope.  It was noted at this visit that she had  chronic sinus bradycardia, was asymptomatic, previous rates have been normal.  It appears she was diagnosed with new onset A-fib with RVR during ED visit in February 2024.  Converted to sinus rhythm in the ED.  Was placed on metoprolol, diltiazem, and discharged home with instructions to follow-up with outpatient cardiology.  Seen in A-fib clinic later that months, was educated continue metoprolol 25 mg twice daily.  Admitted March  2024 for A-fib with RVR.  Was on IV Cardizem drip, transition to amiodarone 200 mg twice daily daily for 20 doses, then reduce to 200 mg daily after that.  Echocardiogram revealed global hypokinesis and EF of 40 to 45%, was started on Eliquis for stroke prophylaxis.  Admitted April 2024 due to a fall, was admitted with concern for acute on chronic combined CHF exacerbation, patient was noted to be hypotensive and had some autonomic dysfunction in setting of Parkinson's disease with recurrent orthostasis, started on midodrine twice daily.  Was noted to have UTI, started on Rocephin.  Was told to follow-up with cardiology.  Admitted end of April 2024-early May 2024 for A-fib with RVR, was noted to have chest pain and dyspnea x 2 days.  She was noted to have signs of volume overload, at the time was only taking Lasix as needed.  She had taken Lasix that day, however said she had not taken it the day before.  She was noted to have smothering chest pain, has had a similar episodes to this in the past.  She had noted episodes of diarrhea, 2 days prior to visit.  Due to her poor appetite and diarrhea, she reported she lost 25 pounds in 2 weeks, was concerned that she was also gaining fluid.  Heart rate was 130s on presentation.  Received IV amiodarone, transition to oral amiodarone.  Was discussed that if she continue to remain in A-fib, to discuss possible cardioversion.  SNF was recommended.  Patient opted to go home. Was told to follow-up with outpatient Cardiology.  Recently admitted to [facility] due to having multiple , recurring falls at home.  Treated for UTI.  Heart rate racing during hospitalization, metoprolol was restarted.  Lasix was restarted.  Today she presents for hospital follow-up. Residing at [facility]. Difficult obtaining hx. Says she is doing well, however pt is very fatigued during interview today, nodding off several times as I am interviewing her. Denies falls and denies any chest pain, shortness  of breath, palpitations, syncope, presyncope, dizziness, orthopnea, PND, significant weight changes, acute bleeding, or claudication. Does note leg edema. Weight is stable.   EKGs/Labs/Other Studies Reviewed:   The following studies were reviewed today:   EKG:  EKG is ordered today.  The ekg ordered today demonstrates A-fib, 94 bpm.   Echo 06/2022:   1. Cannot fully assess wall motion; windows are challenging. Left  ventricular ejection fraction, by estimation, is 40 to 45%. The left  ventricle has mildly decreased function. The left ventricle demonstrates  global hypokinesis. The left ventricular  internal cavity size was mildly dilated. There is mild concentric left  ventricular hypertrophy. Left ventricular diastolic parameters are  indeterminate.   2. Right ventricular systolic function is mildly reduced. The right  ventricular size is not well visualized. Tricuspid regurgitation signal is  inadequate for assessing PA pressure.   3. Left atrial size was moderately dilated.   4. A small pericardial effusion is present.   5. The mitral valve is degenerative. No evidence of mitral valve  regurgitation.   6.  Aortic valve regurgitation is not visualized.   7. The inferior vena cava is dilated in size with >50% respiratory  variability, suggesting right atrial pressure of 8 mmHg.   Conclusion(s)/Recommendation(s): EF appears to be mildly decreased from  prior study; windows are challenging.  Recent Labs: 08/24/2022: B Natriuretic Peptide 1,029.0 08/25/2022: TSH 4.724 08/31/2022: ALT 14; BUN 14; Creatinine, Ser 0.90; Hemoglobin 11.8; Magnesium 1.8; Platelets 180; Potassium 3.7; Sodium 141  Recent Lipid Panel    Component Value Date/Time   CHOL 158 02/24/2022 1513   TRIG 203 (H) 02/24/2022 1513   HDL 39 (L) 02/24/2022 1513   CHOLHDL 4.1 02/24/2022 1513   CHOLHDL 6.9 09/19/2014 0830   VLDL 49 (H) 09/19/2014 0830   LDLCALC 85 02/24/2022 1513    Risk Assessment/Calculations:    CHA2DS2-VASc Score = 4  This indicates a 4.8% annual risk of stroke. The patient's score is based upon: CHF History: 1 HTN History: 1 Diabetes History: 0 Stroke History: 0 Vascular Disease History: 0 Age Score: 1 Gender Score: 1    The 10-year ASCVD risk score (Arnett DK, et al., 2019) is: 34.6%   Values used to calculate the score:     Age: 60 years     Sex: Female     Is Non-Hispanic African American: No     Diabetic: Yes     Tobacco smoker: No     Systolic Blood Pressure: 147 mmHg     Is BP treated: Yes     HDL Cholesterol: 39 mg/dL     Total Cholesterol: 158 mg/dL  Home Medications   No outpatient medications have been marked as taking for the 11/19/22 encounter (Appointment) with Sharlene Dory, NP.     Review of Systems    All other systems reviewed and are otherwise negative except as noted above.  Physical Exam    VS:  There were no vitals taken for this visit. , BMI There is no height or weight on file to calculate BMI.  Wt Readings from Last 3 Encounters:  09/10/22 210 lb 12.8 oz (95.6 kg)  09/02/22 212 lb 4.9 oz (96.3 kg)  08/28/22 211 lb 6.7 oz (95.9 kg)     GEN: Obese, in no acute distress. HEENT: normal. Neck: Supple, no JVD, carotid bruits, or masses. Cardiac: S1/S2, irregular rhythm and regular rate, no murmurs, rubs, or gallops. No clubbing, cyanosis.  2-3+ pitting edema to bilateral lower extremities. Radials 2+/PT 1+ and equal bilaterally.  Respiratory:  Respirations regular and unlabored, clear to auscultation bilaterally. GI: Soft, nontender, nondistended. Skin: Warm and dry, no rash. Neuro:  Strength and sensation are intact, occasional tremors, wheelchair-bound.  Psych: Normal affect.  Assessment & Plan    A-fib, on amiodarone therapy, medication management Denies any symptoms.  EKG confirms that she has remained in A-fib.  Reviewed past cardiology note dated Aug 27, 2022 that recommended DCCV if she has been compliant with Eliquis at  least 3 weeks prior.  I confirmed this with her and discussed/reviewed risks versus benefits of DCCV (see below), she is agreeable to proceed.  Case also discussed/reviewed with Dr. Wyline Mood and he was in agreement.  Heart rate ranging from 94 bpm - 108 bpm.  Hesitant to uptitrate metoprolol due to low blood pressures at times.  Continue Lopressor and amiodarone.  Continue Eliquis, she denies any bleeding issues and, reports compliance with this for at least 3 weeks.  She is on appropriate dosage.  Will obtain CBC, CMET, magnesium level, and thyroid panel.  Will also discuss with Elige Radon (listed on DPR) for verification of consent.  Shared Decision Making/Informed Consent The risks (stroke, cardiac arrhythmias rarely resulting in the need for a temporary or permanent pacemaker, skin irritation or burns and complications associated with conscious sedation including aspiration, arrhythmia, respiratory failure and death), benefits (restoration of normal sinus rhythm) and alternatives of a direct current cardioversion were explained in detail to Ms. Buenger and she agrees to proceed.    2.  Chronic diastolic CHF, leg edema Has been associated with episodes of A-fib with RVR.  Weight is down from February 2024.  Stage C, NYHA class II symptoms.  Leg edema noted on exam-see above.  Continue Lasix and Metoprolol.  GDMT limited due to her history of orthostatic hypotension.  Recommended compression stockings. Low sodium diet, fluid restriction <2L, and daily weights encouraged. Educated to contact our office for weight gain of 2 lbs overnight or 5 lbs in one week.  3.  Hypertension, history of orthostatic hypotension Blood pressure soft today.  No change in medication regimen at this time.  Continue midodrine for history of orthostatic hypotension. Discussed to monitor BP at home at least 2 hours after medications and sitting for 5-10 minutes. Heart healthy diet encouraged.   4.  Hyperlipidemia LDL from 6 months  ago was 85.  Triglycerides at 203, HDL low.  Continue atorvastatin. Heart healthy diet encouraged.   5.  History of falls Currently in SNF.  She denies any recurrent falls.  Continue to follow-up with therapy.  Disposition: Follow up in 3-4 week(s) with Dina Rich, MD or APP after DCCV.  Signed, Sharlene Dory, NP 11/19/2022, 8:53 AM [facility]

## 2022-11-23 ENCOUNTER — Encounter: Payer: Self-pay | Admitting: *Deleted

## 2022-11-23 ENCOUNTER — Ambulatory Visit: Payer: Self-pay | Admitting: *Deleted

## 2022-11-23 NOTE — Patient Outreach (Signed)
Care Coordination   Follow Up Visit Note   11/23/2022  Name: MARCELINA Rollins MRN: [id number] DOB: 29-Oct-1950  Allison Rollins is a 72 y.o. year old female who sees Allison Earing, FNP for primary care. I spoke with patient's son, Allison Rollins by phone today.  What matters to the patients health and wellness today?  Find Help in My Community.   Goals Addressed               This Visit's Progress     Find Help in My Community. (pt-stated)   On track     Care Coordination Interventions:  Interventions Today    Flowsheet Row Most Recent Value  Chronic Disease   Chronic disease during today's visit Hypertension (HTN), Chronic Obstructive Pulmonary Disease (COPD), Other, Chronic Kidney Disease/End Stage Renal Disease (ESRD)  [Parkinson's Disease, Neuropathy, Recurrent Depression & Generalized Anxiety Disorder]  General Interventions   General Interventions Discussed/Reviewed General Interventions Discussed, General Interventions Reviewed, Annual Eye Exam, Labs, Durable Medical Equipment (DME), Vaccines, Health Screening, Community Resources, Doctor Visits, Level of Care  Labs Hgb A1c annually  Vaccines COVID-19, Flu, Pneumonia, RSV, Shingles  Doctor Visits Discussed/Reviewed Doctor Visits Discussed, Doctor Visits Reviewed, Annual Wellness Visits, PCP, Specialist  Health Screening Colonoscopy, Bone Density, Mammogram  Durable Medical Equipment (DME) BP Cuff, Walker, Psychologist, forensic  PCP/Specialist Visits Compliance with follow-up visit  Level of Care Applications, Assisted Living, Personal Care Counsellor, Personal Care Services, Other  [facility] Financial Assistance Application]  Exercise Interventions   Exercise Discussed/Reviewed Exercise Discussed, Exercise Reviewed, Physical Activity, Assistive device use and maintanence  Physical Activity Discussed/Reviewed Physical Activity Discussed, Physical Activity Reviewed, Types of exercise   Education Interventions   Education Provided Provided Therapist, sports, Provided Education, Provided Web-based Education  Provided Verbal Education On Nutrition, Eye Care, Mental Health/Coping with Illness, Applications, Exercise, Medication, Development worker, community, [employer], When to see the doctor  Applications Medicaid, Personal Care Services, Other  [employer] Health Financial Assistance Application]  Mental Health Interventions   Mental Health Discussed/Reviewed Mental Health Discussed, Mental Health Reviewed, Coping Strategies, Crisis, Anxiety, Depression  Nutrition Interventions   Nutrition Discussed/Reviewed Nutrition Discussed, Nutrition Reviewed, Fluid intake, Portion sizes, Decreasing sugar intake, Decreasing salt, Decreasing fats, Increaing proteins  Pharmacy Interventions   Pharmacy Dicussed/Reviewed Pharmacy Topics Discussed, Pharmacy Topics Reviewed, Medication Adherence, Affording Medications  Safety Interventions   Safety Discussed/Reviewed Safety Discussed, Safety Reviewed, Fall Risk  Advanced Directive Interventions   Advanced Directives Discussed/Reviewed Advanced Directives Discussed, Advanced Directives Reviewed      Active Listening & Reflection Utilized.  Verbalization of Feelings Encouraged.  Emotional Support Provided. Problem Solving Interventions Implemented. Solution-Focused Strategies Activated. CSW Collaboration with Son, Allison Rollins to Confirm Patient's Admission to [facility] [phone]), on 11/23/2022, Due to Fall Sustained at [facility] [phone]), Where Patient Was Residing to Receive Short-Term Rehabilitative Services. CSW Collaboration with Son, Allison Rollins to Encourage Consideration of Long-Term Care Placement for Patient, Upon Medical Clearance & Discharge from [facility] [phone]), If Short-Term Rehabilitative Services at [facility] [phone]) is No Longer Recommended. CSW Collaboration with Son, Allison Rollins to Confirm Interest in Long-Term Care Placement for Patient at [facility] [phone]). CSW Collaboration with Son, Allison Rollins to [employer] with Transition of Care Case Manager at [facility] [phone]), to Express Interest in Long-Term Care Placement for Patient,  Preferably at [facility] [phone]).  CSW Collaboration with Allison Rollins, Medicaid Case Worker with [facility] Department of Social Services [phone]), to Confirm Patient's Approval for Special Assistance Long-Term Care Medicaid. CSW Collaboration with Son, Allison Rollins to Encourage Continued Implementation of Process for Completing Patient's Advance Directives (Living Will & Healthcare Power of Attorney Documents). CSW Collaboration with Son, Allison Rollins to [employer] of Completed, Signed & Notarized Advance Directives (Living Will & Healthcare Power of Corporate treasurer) to Primary Care Provider, Family Nurse Practitioner, Allison Rollins with  [facility] [phone]), to Scan into Electronic Medical Record in Epic. CSW Collaboration with Son, Allison Rollins to [employer] with CSW [phone]), if He Has Questions, Needs Assistance, or If Additional Social Work Needs Are Identified Between Now & Our Next Scheduled Follow-Up Outreach Call.      SDOH assessments and interventions completed:  Yes.  Care Coordination Interventions:  Yes, provided.   Follow up plan: Follow up call scheduled for 12/21/2022 at 10:15 am.  Encounter Outcome:  Pt. Visit Completed.   Danford Bad, BSW, MSW, LCSW  Licensed Restaurant manager, [employer] [facility]  Mailing [address] [place], [place] [address] Physical Address-[address], [place], [place] [address] Rollins Free Main # [phone] Fax # [phone] Cell # [phone] [email or web address]

## 2022-11-23 NOTE — Patient Instructions (Signed)
Visit Information  Thank you for taking time to visit with me today. Please don't hesitate to contact me if I can be of assistance to you.   Following are the goals we discussed today:   Goals Addressed               This Visit's Progress     Find Help in My Community. (pt-stated)   On track     Care Coordination Interventions:  Interventions Today    Flowsheet Row Most Recent Value  Chronic Disease   Chronic disease during today's visit Hypertension (HTN), Chronic Obstructive Pulmonary Disease (COPD), Other, Chronic Kidney Disease/End Stage Renal Disease (ESRD)  [Parkinson's Disease, Neuropathy, Recurrent Depression & Generalized Anxiety Disorder]  General Interventions   General Interventions Discussed/Reviewed General Interventions Discussed, General Interventions Reviewed, Annual Eye Exam, Labs, Durable Medical Equipment (DME), Vaccines, Health Screening, Community Resources, Doctor Visits, Level of Care  Labs Hgb A1c annually  Vaccines COVID-19, Flu, Pneumonia, RSV, Shingles  Doctor Visits Discussed/Reviewed Doctor Visits Discussed, Doctor Visits Reviewed, Annual Wellness Visits, PCP, Specialist  Health Screening Colonoscopy, Bone Density, Mammogram  Durable Medical Equipment (DME) BP Cuff, Walker, Psychologist, forensic  PCP/Specialist Visits Compliance with follow-up visit  Level of Care Applications, Assisted Living, Personal Care Counsellor, Personal Care Services, Other  [facility] Health Financial Assistance Application]  Exercise Interventions   Exercise Discussed/Reviewed Exercise Discussed, Exercise Reviewed, Physical Activity, Assistive device use and maintanence  Physical Activity Discussed/Reviewed Physical Activity Discussed, Physical Activity Reviewed, Types of exercise  Education Interventions   Education Provided Provided Therapist, sports, Provided Education, Provided Web-based Education  Provided Verbal Education On Nutrition, Eye  Care, Mental Health/Coping with Illness, Applications, Exercise, Medication, Development worker, community, [employer], When to see the doctor  Applications Medicaid, Personal Care Services, Other  [employer] Health Financial Assistance Application]  Mental Health Interventions   Mental Health Discussed/Reviewed Mental Health Discussed, Mental Health Reviewed, Coping Strategies, Crisis, Anxiety, Depression  Nutrition Interventions   Nutrition Discussed/Reviewed Nutrition Discussed, Nutrition Reviewed, Fluid intake, Portion sizes, Decreasing sugar intake, Decreasing salt, Decreasing fats, Increaing proteins  Pharmacy Interventions   Pharmacy Dicussed/Reviewed Pharmacy Topics Discussed, Pharmacy Topics Reviewed, Medication Adherence, Affording Medications  Safety Interventions   Safety Discussed/Reviewed Safety Discussed, Safety Reviewed, Fall Risk  Advanced Directive Interventions   Advanced Directives Discussed/Reviewed Advanced Directives Discussed, Advanced Directives Reviewed      Active Listening & Reflection Utilized.  Verbalization of Feelings Encouraged.  Emotional Support Provided. Problem Solving Interventions Implemented. Solution-Focused Strategies Activated. CSW Collaboration with Son, Nicola Girt to Confirm Patient's Admission to [facility] [phone]), on 11/23/2022, Due to Fall Sustained at [facility] [phone]), Where Patient Was Residing to Receive Short-Term Rehabilitative Services. CSW Collaboration with Son, Nicola Girt to Encourage Consideration of Long-Term Care Placement for Patient, Upon Medical Clearance & Discharge from [facility] [phone]), If Short-Term Rehabilitative Services at [facility] [phone]) is No Longer Recommended. CSW Collaboration with Son, Nicola Girt to Confirm Interest in Long-Term Care Placement for Patient  at [facility] [phone]). CSW Collaboration with Son, Nicola Girt to [employer] with Transition of Care Case Manager at [facility] [phone]), to Express Interest in Long-Term Care Placement for Patient, Preferably at [facility] [phone]).  CSW Collaboration with Kristin Bruins, Medicaid Case Worker with [facility] Department of Social Services [phone]), to Confirm Patient's Approval for  Special Assistance Long-Term Care Medicaid. CSW Collaboration with Son, Nicola Girt to Encourage Continued Implementation of Process for Completing Patient's Advance Directives (Living Will & Healthcare Power of Attorney Documents). CSW Collaboration with Son, Nicola Girt to [employer] of Completed, Signed & Notarized Advance Directives (Living Will & Healthcare Power of Corporate treasurer) to Primary Care Provider, Family Nurse Practitioner, Harlow Mares with [facility] [phone]), to Scan into Electronic Medical Record in Epic. CSW Collaboration with Son, Nicola Girt to [employer] with CSW [phone]), if He Has Questions, Needs Assistance, or If Additional Social Work Needs Are Identified Between Now & Our Next Scheduled Follow-Up Outreach Call.      Our next appointment is by telephone on 12/21/2022 at 10:15 am.  Please call the care guide team at [phone] if you need to cancel or reschedule your appointment.   If you are experiencing a Mental Health or Behavioral Health Crisis or need someone to talk to, please call the Suicide and Crisis Lifeline: 988 call the [place] National Suicide Prevention Lifeline: (425) 355-3555 or TTY: 781-556-4552 TTY 928-289-0689) to talk to a trained counselor call 1-800-273-TALK (toll free, 24 hour hotline) go to [facility] [address], [place] [phone]) call the [facility]  Crisis Line: [phone] call 911  Patient verbalizes understanding of instructions and care plan provided today and agrees to view in MyChart. Active MyChart status and patient understanding of how to access instructions and care plan via MyChart confirmed with patient.     Telephone follow up appointment with care management team member scheduled for:   12/21/2022 at 10:15 am.  Danford Bad, BSW, MSW, LCSW  Licensed Clinical Social Worker  [facility]  Mailing [place]. [address], [place], [place] [address] Physical Address[address] [place], [place] [address] Toll Free Main # [phone] Fax # [phone] Cell # [phone] [email or web address]

## 2022-12-06 ENCOUNTER — Other Ambulatory Visit: Payer: Self-pay | Admitting: Family Medicine

## 2022-12-06 DIAGNOSIS — G2581 Restless legs syndrome: Secondary | ICD-10-CM

## 2022-12-06 DIAGNOSIS — E782 Mixed hyperlipidemia: Secondary | ICD-10-CM

## 2022-12-15 DIAGNOSIS — I5041 Acute combined systolic (congestive) and diastolic (congestive) heart failure: Secondary | ICD-10-CM | POA: Diagnosis not present

## 2022-12-15 DIAGNOSIS — I110 Hypertensive heart disease with heart failure: Secondary | ICD-10-CM | POA: Diagnosis not present

## 2022-12-15 DIAGNOSIS — J449 Chronic obstructive pulmonary disease, unspecified: Secondary | ICD-10-CM | POA: Diagnosis not present

## 2022-12-15 DIAGNOSIS — F339 Major depressive disorder, recurrent, unspecified: Secondary | ICD-10-CM | POA: Diagnosis not present

## 2022-12-15 DIAGNOSIS — G2581 Restless legs syndrome: Secondary | ICD-10-CM | POA: Diagnosis not present

## 2022-12-15 DIAGNOSIS — I482 Chronic atrial fibrillation, unspecified: Secondary | ICD-10-CM | POA: Diagnosis not present

## 2022-12-15 DIAGNOSIS — F411 Generalized anxiety disorder: Secondary | ICD-10-CM | POA: Diagnosis not present

## 2022-12-15 DIAGNOSIS — W19XXXD Unspecified fall, subsequent encounter: Secondary | ICD-10-CM | POA: Diagnosis not present

## 2022-12-15 DIAGNOSIS — S3210XA Unspecified fracture of sacrum, initial encounter for closed fracture: Secondary | ICD-10-CM | POA: Diagnosis not present

## 2022-12-15 DIAGNOSIS — J9611 Chronic respiratory failure with hypoxia: Secondary | ICD-10-CM | POA: Diagnosis not present

## 2022-12-15 DIAGNOSIS — E559 Vitamin D deficiency, unspecified: Secondary | ICD-10-CM | POA: Diagnosis not present

## 2022-12-15 DIAGNOSIS — R41841 Cognitive communication deficit: Secondary | ICD-10-CM | POA: Diagnosis not present

## 2022-12-15 DIAGNOSIS — E785 Hyperlipidemia, unspecified: Secondary | ICD-10-CM | POA: Diagnosis not present

## 2022-12-15 DIAGNOSIS — E441 Mild protein-calorie malnutrition: Secondary | ICD-10-CM | POA: Diagnosis not present

## 2022-12-15 DIAGNOSIS — G629 Polyneuropathy, unspecified: Secondary | ICD-10-CM | POA: Diagnosis not present

## 2022-12-15 DIAGNOSIS — J9610 Chronic respiratory failure, unspecified whether with hypoxia or hypercapnia: Secondary | ICD-10-CM | POA: Diagnosis not present

## 2022-12-15 DIAGNOSIS — G20A1 Parkinson's disease without dyskinesia, without mention of fluctuations: Secondary | ICD-10-CM | POA: Diagnosis not present

## 2022-12-15 DIAGNOSIS — R531 Weakness: Secondary | ICD-10-CM | POA: Diagnosis not present

## 2022-12-15 DIAGNOSIS — R262 Difficulty in walking, not elsewhere classified: Secondary | ICD-10-CM | POA: Diagnosis not present

## 2022-12-15 DIAGNOSIS — I10 Essential (primary) hypertension: Secondary | ICD-10-CM | POA: Diagnosis not present

## 2022-12-15 DIAGNOSIS — R5381 Other malaise: Secondary | ICD-10-CM | POA: Diagnosis not present

## 2022-12-15 DIAGNOSIS — M6281 Muscle weakness (generalized): Secondary | ICD-10-CM | POA: Diagnosis not present

## 2022-12-15 DIAGNOSIS — R001 Bradycardia, unspecified: Secondary | ICD-10-CM | POA: Diagnosis not present

## 2022-12-15 DIAGNOSIS — N390 Urinary tract infection, site not specified: Secondary | ICD-10-CM | POA: Diagnosis not present

## 2022-12-15 DIAGNOSIS — I5042 Chronic combined systolic (congestive) and diastolic (congestive) heart failure: Secondary | ICD-10-CM | POA: Diagnosis not present

## 2022-12-15 DIAGNOSIS — Z9981 Dependence on supplemental oxygen: Secondary | ICD-10-CM | POA: Diagnosis not present

## 2022-12-15 DIAGNOSIS — S3210XD Unspecified fracture of sacrum, subsequent encounter for fracture with routine healing: Secondary | ICD-10-CM | POA: Diagnosis not present

## 2022-12-18 DIAGNOSIS — I10 Essential (primary) hypertension: Secondary | ICD-10-CM | POA: Diagnosis not present

## 2022-12-18 DIAGNOSIS — E559 Vitamin D deficiency, unspecified: Secondary | ICD-10-CM | POA: Diagnosis not present

## 2022-12-20 DIAGNOSIS — J449 Chronic obstructive pulmonary disease, unspecified: Secondary | ICD-10-CM | POA: Diagnosis not present

## 2022-12-20 DIAGNOSIS — I5041 Acute combined systolic (congestive) and diastolic (congestive) heart failure: Secondary | ICD-10-CM | POA: Diagnosis not present

## 2022-12-20 DIAGNOSIS — E559 Vitamin D deficiency, unspecified: Secondary | ICD-10-CM | POA: Diagnosis not present

## 2022-12-20 DIAGNOSIS — J9611 Chronic respiratory failure with hypoxia: Secondary | ICD-10-CM | POA: Diagnosis not present

## 2022-12-20 DIAGNOSIS — I482 Chronic atrial fibrillation, unspecified: Secondary | ICD-10-CM | POA: Diagnosis not present

## 2022-12-21 ENCOUNTER — Encounter: Payer: Self-pay | Admitting: *Deleted

## 2022-12-21 ENCOUNTER — Ambulatory Visit: Payer: Self-pay | Admitting: *Deleted

## 2022-12-21 NOTE — Patient Instructions (Signed)
Visit Information  Thank you for taking time to visit with me today. Please don't hesitate to contact me if I can be of assistance to you.   Following are the goals we discussed today:   Goals Addressed               This Visit's Progress     COMPLETED: Find Help in My Community. (pt-stated)   On track     Care Coordination Interventions:  Interventions Today    Flowsheet Row Most Recent Value  Chronic Disease   Chronic disease during today's visit Hypertension (HTN), Chronic Obstructive Pulmonary Disease (COPD), Other, Chronic Kidney Disease/End Stage Renal Disease (ESRD)  [Parkinson's Disease, Neuropathy, Recurrent Depression & Generalized Anxiety Disorder]  General Interventions   General Interventions Discussed/Reviewed General Interventions Discussed, General Interventions Reviewed, Annual Eye Exam, Labs, Durable Medical Equipment (DME), Vaccines, Health Screening, Community Resources, Doctor Visits, Level of Care  Labs Hgb A1c annually  Vaccines COVID-19, Flu, Pneumonia, RSV, Shingles  Doctor Visits Discussed/Reviewed Doctor Visits Discussed, Doctor Visits Reviewed, Annual Wellness Visits, PCP, Specialist  Health Screening Colonoscopy, Bone Density, Mammogram  Durable Medical Equipment (DME) BP Cuff, Walker, Psychologist, forensic  PCP/Specialist Visits Compliance with follow-up visit  Level of Care Applications, Assisted Living, Personal Care Counsellor, Personal Care Services, Other  [facility] Health Financial Assistance Application]  Exercise Interventions   Exercise Discussed/Reviewed Exercise Discussed, Exercise Reviewed, Physical Activity, Assistive device use and maintanence  Physical Activity Discussed/Reviewed Physical Activity Discussed, Physical Activity Reviewed, Types of exercise  Education Interventions   Education Provided Provided Therapist, sports, Provided Education, Provided Web-based Education  Provided Verbal Education On  Nutrition, Eye Care, Mental Health/Coping with Illness, Applications, Exercise, Medication, Development worker, community, [employer], When to see the doctor  Applications Medicaid, Personal Care Services, Other  [employer] Health Financial Assistance Application]  Mental Health Interventions   Mental Health Discussed/Reviewed Mental Health Discussed, Mental Health Reviewed, Coping Strategies, Crisis, Anxiety, Depression  Nutrition Interventions   Nutrition Discussed/Reviewed Nutrition Discussed, Nutrition Reviewed, Fluid intake, Portion sizes, Decreasing sugar intake, Decreasing salt, Decreasing fats, Increaing proteins  Pharmacy Interventions   Pharmacy Dicussed/Reviewed Pharmacy Topics Discussed, Pharmacy Topics Reviewed, Medication Adherence, Affording Medications  Safety Interventions   Safety Discussed/Reviewed Safety Discussed, Safety Reviewed, Fall Risk  Advanced Directive Interventions   Advanced Directives Discussed/Reviewed Advanced Directives Discussed, Advanced Directives Reviewed      Active Listening & Reflection Utilized.  Verbalization of Feelings Encouraged.  Emotional Support Provided. Problem Solving Interventions Implemented. Solution-Focused Strategies Activated. CSW Collaboration with Son's, Nicola Girt & [place] Plantz to [employer] from [facility] [phone]) to [facility] [phone]), to Receive [facility]. CSW Collaboration with Kristin Bruins, Medicaid Case Worker with [facility] Department of Social Services [phone]), to Confirm Patient's Adult Medicaid Coverage Has Been Changed to Special Assistance Long-Term Care Medicaid. CSW Collaboration with Son's, Nicola Girt & [place] Plantz to [employer] of Process for Completing Patient's Advance Directives (Living Will & Healthcare Power of Attorney Documents). CSW Collaboration with Son's, Nicola Girt  & [place] Plant[employer] to [employer] of Completed, Signed, & Notarized Scientist, product/process development (Living Will & Healthcare Power of Corporate treasurer) to Harlow Mares, Family Nurse Practitioner with [facility] [phone]), to Scan into Patient's Electronic Medical Record in Epic. CSW Collaboration with Son's, Nicola Girt & [place] Plantz to [employer] with CSW (# [phone]), if They Have Questions, Need  Assistance, or If Additional Social Work Needs Are Identified in The Near Future.      Please call the care guide team at [phone] if you need to cancel or reschedule your appointment.   If you are experiencing a Mental Health or Behavioral Health Crisis or need someone to talk to, please call the Suicide and Crisis Lifeline: 988 call the [place] National Suicide Prevention Lifeline: (413) 046-0643 or TTY: 906 162 3293 TTY 250-771-0721) to talk to a trained counselor call 1-800-273-TALK (toll free, 24 hour hotline) go to [facility] [address] [place] [phone]) call the [facility] Crisis Line: [phone] call 911  Patient verbalizes understanding of instructions and care plan provided today and agrees to view in MyChart. Active MyChart status and patient understanding of how to access instructions and care plan via MyChart confirmed with patient.     No further follow-up required.  Danford Bad, BSW, MSW, Printmaker Social Work Case Set designer Health  [facility], Population Health Direct Dial: [phone]  Fax: [phone] Email: [email or web address]: [email or web address]

## 2022-12-21 NOTE — Patient Outreach (Addendum)
Care Coordination   Follow Up Visit Note   12/21/2022  Name: Allison Rollins MRN: [id number] DOB: 1950/11/07  Allison Rollins is a 72 y.o. year old female who sees Gabriel Earing, FNP for primary care. I spoke with patient's son's Allison Rollins & Allison Rollins by phone today.  What matters to the patients health and wellness today?  Find Help in My Community.   Goals Addressed               This Visit's Progress     COMPLETED: Find Help in My Community. (pt-stated)   On track     Care Coordination Interventions:  Interventions Today    Flowsheet Row Most Recent Value  Chronic Disease   Chronic disease during today's visit Hypertension (HTN), Chronic Obstructive Pulmonary Disease (COPD), Other, Chronic Kidney Disease/End Stage Renal Disease (ESRD)  [Parkinson's Disease, Neuropathy, Recurrent Depression & Generalized Anxiety Disorder]  General Interventions   General Interventions Discussed/Reviewed General Interventions Discussed, General Interventions Reviewed, Annual Eye Exam, Labs, Durable Medical Equipment (DME), Vaccines, Health Screening, Community Resources, Doctor Visits, Level of Care  Labs Hgb A1c annually  Vaccines COVID-19, Flu, Pneumonia, RSV, Shingles  Doctor Visits Discussed/Reviewed Doctor Visits Discussed, Doctor Visits Reviewed, Annual Wellness Visits, PCP, Specialist  Health Screening Colonoscopy, Bone Density, Mammogram  Durable Medical Equipment (DME) BP Cuff, Walker, Psychologist, forensic  PCP/Specialist Visits Compliance with follow-up visit  Level of Care Applications, Assisted Living, Personal Care Counsellor, Personal Care Services, Other  [facility] Health Financial Assistance Application]  Exercise Interventions   Exercise Discussed/Reviewed Exercise Discussed, Exercise Reviewed, Physical Activity, Assistive device use and maintanence  Physical Activity Discussed/Reviewed Physical Activity Discussed, Physical Activity  Reviewed, Types of exercise  Education Interventions   Education Provided Provided Therapist, sports, Provided Education, Provided Web-based Education  Provided Verbal Education On Nutrition, Eye Care, Mental Health/Coping with Illness, Applications, Exercise, Medication, Development worker, community, [employer], When to see the doctor  Applications Medicaid, Personal Care Services, Other  [employer] Health Financial Assistance Application]  Mental Health Interventions   Mental Health Discussed/Reviewed Mental Health Discussed, Mental Health Reviewed, Coping Strategies, Crisis, Anxiety, Depression  Nutrition Interventions   Nutrition Discussed/Reviewed Nutrition Discussed, Nutrition Reviewed, Fluid intake, Portion sizes, Decreasing sugar intake, Decreasing salt, Decreasing fats, Increaing proteins  Pharmacy Interventions   Pharmacy Dicussed/Reviewed Pharmacy Topics Discussed, Pharmacy Topics Reviewed, Medication Adherence, Affording Medications  Safety Interventions   Safety Discussed/Reviewed Safety Discussed, Safety Reviewed, Fall Risk  Advanced Directive Interventions   Advanced Directives Discussed/Reviewed Advanced Directives Discussed, Advanced Directives Reviewed      Active Listening & Reflection Utilized.  Verbalization of Feelings Encouraged.  Emotional Support Provided. Problem Solving Interventions Implemented. Solution-Focused Strategies Activated. CSW Collaboration with Son's, Allison Rollins & Allison Rollins to [employer] from [facility] [phone]) to [facility] [phone]), to Receive [facility]. CSW Collaboration with Kristin Bruins, Medicaid Case Worker with [facility] Department of Social Services [phone]), to Confirm Patient's Adult Medicaid Coverage Has Been Changed to Special Assistance Long-Term Care Medicaid. CSW Collaboration with Son's, Allison Rollins & Allison Rollins to  [employer] of Process for Completing Patient's Advance Directives (Living Will & Healthcare Power of Attorney Documents). CSW Collaboration with Son's, Allison Rollins & Allison Rollins to [employer] of Completed, Signed, & Notarized Scientist, product/process development (Living Will & Healthcare Power of Corporate treasurer) to Harlow Mares, Family Nurse Practitioner with [employer] [facility] (# [phone]), to Scan into Patient's Electronic Medical Record in Epic. CSW Collaboration with Son's, Allison Rollins & Allison Rollins to [employer] with CSW (# [phone]), if They Have Questions, Need Assistance, or If Additional Social Work Needs Are Identified in The Near Future.      SDOH assessments and interventions completed:  Yes.  Care Coordination Interventions:  Yes, provided.   Follow up plan: No further intervention required.   Encounter Outcome:  Pt. Visit Completed.   Danford Bad, BSW, MSW, Printmaker Social Work Case Set designer Health  [facility], Population Health Direct Dial: [phone]  Fax: [phone] Email: [email or web address]: [email or web address]

## 2022-12-22 DIAGNOSIS — F339 Major depressive disorder, recurrent, unspecified: Secondary | ICD-10-CM | POA: Diagnosis not present

## 2022-12-22 DIAGNOSIS — F411 Generalized anxiety disorder: Secondary | ICD-10-CM | POA: Diagnosis not present

## 2022-12-27 DIAGNOSIS — J9610 Chronic respiratory failure, unspecified whether with hypoxia or hypercapnia: Secondary | ICD-10-CM | POA: Diagnosis not present

## 2022-12-27 DIAGNOSIS — S3210XA Unspecified fracture of sacrum, initial encounter for closed fracture: Secondary | ICD-10-CM | POA: Diagnosis not present

## 2022-12-27 DIAGNOSIS — R5381 Other malaise: Secondary | ICD-10-CM | POA: Diagnosis not present

## 2022-12-27 DIAGNOSIS — I482 Chronic atrial fibrillation, unspecified: Secondary | ICD-10-CM | POA: Diagnosis not present

## 2022-12-31 DIAGNOSIS — N390 Urinary tract infection, site not specified: Secondary | ICD-10-CM | POA: Diagnosis not present

## 2022-12-31 DIAGNOSIS — I10 Essential (primary) hypertension: Secondary | ICD-10-CM | POA: Diagnosis not present

## 2023-01-01 DIAGNOSIS — N390 Urinary tract infection, site not specified: Secondary | ICD-10-CM | POA: Diagnosis not present

## 2023-01-01 DIAGNOSIS — I10 Essential (primary) hypertension: Secondary | ICD-10-CM | POA: Diagnosis not present

## 2023-01-02 DIAGNOSIS — R531 Weakness: Secondary | ICD-10-CM | POA: Diagnosis not present

## 2023-01-02 DIAGNOSIS — M6281 Muscle weakness (generalized): Secondary | ICD-10-CM | POA: Diagnosis not present

## 2023-01-02 DIAGNOSIS — R262 Difficulty in walking, not elsewhere classified: Secondary | ICD-10-CM | POA: Diagnosis not present

## 2023-01-03 DIAGNOSIS — I509 Heart failure, unspecified: Secondary | ICD-10-CM | POA: Diagnosis not present

## 2023-01-03 DIAGNOSIS — I10 Essential (primary) hypertension: Secondary | ICD-10-CM | POA: Diagnosis not present

## 2023-01-03 DIAGNOSIS — R296 Repeated falls: Secondary | ICD-10-CM | POA: Diagnosis not present

## 2023-01-04 ENCOUNTER — Telehealth: Payer: Self-pay | Admitting: Family Medicine

## 2023-01-05 ENCOUNTER — Encounter: Payer: Self-pay | Admitting: Family Medicine

## 2023-01-05 ENCOUNTER — Other Ambulatory Visit: Payer: Self-pay | Admitting: Family Medicine

## 2023-01-05 DIAGNOSIS — G2581 Restless legs syndrome: Secondary | ICD-10-CM

## 2023-01-05 DIAGNOSIS — F339 Major depressive disorder, recurrent, unspecified: Secondary | ICD-10-CM | POA: Diagnosis not present

## 2023-01-05 DIAGNOSIS — E782 Mixed hyperlipidemia: Secondary | ICD-10-CM

## 2023-01-05 DIAGNOSIS — F411 Generalized anxiety disorder: Secondary | ICD-10-CM | POA: Diagnosis not present

## 2023-01-05 NOTE — Telephone Encounter (Signed)
NA letter mailed

## 2023-01-05 NOTE — Telephone Encounter (Signed)
Tiffany NTBS 30 days given 12/29/22

## 2023-01-10 DIAGNOSIS — I509 Heart failure, unspecified: Secondary | ICD-10-CM | POA: Diagnosis not present

## 2023-01-10 DIAGNOSIS — R5381 Other malaise: Secondary | ICD-10-CM | POA: Diagnosis not present

## 2023-01-10 DIAGNOSIS — N390 Urinary tract infection, site not specified: Secondary | ICD-10-CM | POA: Diagnosis not present

## 2023-01-12 DIAGNOSIS — F411 Generalized anxiety disorder: Secondary | ICD-10-CM | POA: Diagnosis not present

## 2023-01-12 DIAGNOSIS — F339 Major depressive disorder, recurrent, unspecified: Secondary | ICD-10-CM | POA: Diagnosis not present

## 2023-01-18 DIAGNOSIS — F4321 Adjustment disorder with depressed mood: Secondary | ICD-10-CM | POA: Diagnosis not present

## 2023-01-19 DIAGNOSIS — F339 Major depressive disorder, recurrent, unspecified: Secondary | ICD-10-CM | POA: Diagnosis not present

## 2023-01-19 DIAGNOSIS — F411 Generalized anxiety disorder: Secondary | ICD-10-CM | POA: Diagnosis not present

## 2023-01-19 NOTE — Telephone Encounter (Signed)
Encounter opened in error

## 2023-01-25 DIAGNOSIS — R41841 Cognitive communication deficit: Secondary | ICD-10-CM | POA: Diagnosis not present

## 2023-01-25 DIAGNOSIS — R278 Other lack of coordination: Secondary | ICD-10-CM | POA: Diagnosis not present

## 2023-01-25 DIAGNOSIS — M6281 Muscle weakness (generalized): Secondary | ICD-10-CM | POA: Diagnosis not present

## 2023-01-25 DIAGNOSIS — S3210XD Unspecified fracture of sacrum, subsequent encounter for fracture with routine healing: Secondary | ICD-10-CM | POA: Diagnosis not present

## 2023-01-26 DIAGNOSIS — F411 Generalized anxiety disorder: Secondary | ICD-10-CM | POA: Diagnosis not present

## 2023-01-26 DIAGNOSIS — F339 Major depressive disorder, recurrent, unspecified: Secondary | ICD-10-CM | POA: Diagnosis not present

## 2023-01-26 DIAGNOSIS — M6281 Muscle weakness (generalized): Secondary | ICD-10-CM | POA: Diagnosis not present

## 2023-01-26 DIAGNOSIS — R41841 Cognitive communication deficit: Secondary | ICD-10-CM | POA: Diagnosis not present

## 2023-01-26 DIAGNOSIS — R278 Other lack of coordination: Secondary | ICD-10-CM | POA: Diagnosis not present

## 2023-01-26 DIAGNOSIS — S3210XD Unspecified fracture of sacrum, subsequent encounter for fracture with routine healing: Secondary | ICD-10-CM | POA: Diagnosis not present

## 2023-01-27 DIAGNOSIS — M6281 Muscle weakness (generalized): Secondary | ICD-10-CM | POA: Diagnosis not present

## 2023-01-27 DIAGNOSIS — R41841 Cognitive communication deficit: Secondary | ICD-10-CM | POA: Diagnosis not present

## 2023-01-27 DIAGNOSIS — S3210XD Unspecified fracture of sacrum, subsequent encounter for fracture with routine healing: Secondary | ICD-10-CM | POA: Diagnosis not present

## 2023-01-27 DIAGNOSIS — R278 Other lack of coordination: Secondary | ICD-10-CM | POA: Diagnosis not present

## 2023-01-28 DIAGNOSIS — S3210XD Unspecified fracture of sacrum, subsequent encounter for fracture with routine healing: Secondary | ICD-10-CM | POA: Diagnosis not present

## 2023-01-28 DIAGNOSIS — M6281 Muscle weakness (generalized): Secondary | ICD-10-CM | POA: Diagnosis not present

## 2023-01-28 DIAGNOSIS — R41841 Cognitive communication deficit: Secondary | ICD-10-CM | POA: Diagnosis not present

## 2023-01-28 DIAGNOSIS — R278 Other lack of coordination: Secondary | ICD-10-CM | POA: Diagnosis not present

## 2023-01-31 DIAGNOSIS — S3210XD Unspecified fracture of sacrum, subsequent encounter for fracture with routine healing: Secondary | ICD-10-CM | POA: Diagnosis not present

## 2023-01-31 DIAGNOSIS — M6281 Muscle weakness (generalized): Secondary | ICD-10-CM | POA: Diagnosis not present

## 2023-01-31 DIAGNOSIS — R278 Other lack of coordination: Secondary | ICD-10-CM | POA: Diagnosis not present

## 2023-01-31 DIAGNOSIS — R41841 Cognitive communication deficit: Secondary | ICD-10-CM | POA: Diagnosis not present

## 2023-02-01 DIAGNOSIS — R531 Weakness: Secondary | ICD-10-CM | POA: Diagnosis not present

## 2023-02-01 DIAGNOSIS — R278 Other lack of coordination: Secondary | ICD-10-CM | POA: Diagnosis not present

## 2023-02-01 DIAGNOSIS — S3210XD Unspecified fracture of sacrum, subsequent encounter for fracture with routine healing: Secondary | ICD-10-CM | POA: Diagnosis not present

## 2023-02-01 DIAGNOSIS — M6281 Muscle weakness (generalized): Secondary | ICD-10-CM | POA: Diagnosis not present

## 2023-02-01 DIAGNOSIS — R262 Difficulty in walking, not elsewhere classified: Secondary | ICD-10-CM | POA: Diagnosis not present

## 2023-02-01 DIAGNOSIS — R41841 Cognitive communication deficit: Secondary | ICD-10-CM | POA: Diagnosis not present

## 2023-02-02 DIAGNOSIS — S3210XD Unspecified fracture of sacrum, subsequent encounter for fracture with routine healing: Secondary | ICD-10-CM | POA: Diagnosis not present

## 2023-02-02 DIAGNOSIS — R41841 Cognitive communication deficit: Secondary | ICD-10-CM | POA: Diagnosis not present

## 2023-02-02 DIAGNOSIS — M6281 Muscle weakness (generalized): Secondary | ICD-10-CM | POA: Diagnosis not present

## 2023-02-02 DIAGNOSIS — R278 Other lack of coordination: Secondary | ICD-10-CM | POA: Diagnosis not present

## 2023-02-03 DIAGNOSIS — S3210XD Unspecified fracture of sacrum, subsequent encounter for fracture with routine healing: Secondary | ICD-10-CM | POA: Diagnosis not present

## 2023-02-03 DIAGNOSIS — R278 Other lack of coordination: Secondary | ICD-10-CM | POA: Diagnosis not present

## 2023-02-03 DIAGNOSIS — R41841 Cognitive communication deficit: Secondary | ICD-10-CM | POA: Diagnosis not present

## 2023-02-03 DIAGNOSIS — M6281 Muscle weakness (generalized): Secondary | ICD-10-CM | POA: Diagnosis not present

## 2023-02-04 DIAGNOSIS — R278 Other lack of coordination: Secondary | ICD-10-CM | POA: Diagnosis not present

## 2023-02-04 DIAGNOSIS — R41841 Cognitive communication deficit: Secondary | ICD-10-CM | POA: Diagnosis not present

## 2023-02-04 DIAGNOSIS — M6281 Muscle weakness (generalized): Secondary | ICD-10-CM | POA: Diagnosis not present

## 2023-02-04 DIAGNOSIS — S3210XD Unspecified fracture of sacrum, subsequent encounter for fracture with routine healing: Secondary | ICD-10-CM | POA: Diagnosis not present

## 2023-02-07 DIAGNOSIS — R278 Other lack of coordination: Secondary | ICD-10-CM | POA: Diagnosis not present

## 2023-02-07 DIAGNOSIS — S3210XD Unspecified fracture of sacrum, subsequent encounter for fracture with routine healing: Secondary | ICD-10-CM | POA: Diagnosis not present

## 2023-02-07 DIAGNOSIS — M6281 Muscle weakness (generalized): Secondary | ICD-10-CM | POA: Diagnosis not present

## 2023-02-07 DIAGNOSIS — R41841 Cognitive communication deficit: Secondary | ICD-10-CM | POA: Diagnosis not present

## 2023-02-08 DIAGNOSIS — M6281 Muscle weakness (generalized): Secondary | ICD-10-CM | POA: Diagnosis not present

## 2023-02-08 DIAGNOSIS — J9610 Chronic respiratory failure, unspecified whether with hypoxia or hypercapnia: Secondary | ICD-10-CM | POA: Diagnosis not present

## 2023-02-08 DIAGNOSIS — G4733 Obstructive sleep apnea (adult) (pediatric): Secondary | ICD-10-CM | POA: Diagnosis not present

## 2023-02-08 DIAGNOSIS — R41841 Cognitive communication deficit: Secondary | ICD-10-CM | POA: Diagnosis not present

## 2023-02-08 DIAGNOSIS — J449 Chronic obstructive pulmonary disease, unspecified: Secondary | ICD-10-CM | POA: Diagnosis not present

## 2023-02-08 DIAGNOSIS — F4321 Adjustment disorder with depressed mood: Secondary | ICD-10-CM | POA: Diagnosis not present

## 2023-02-08 DIAGNOSIS — I509 Heart failure, unspecified: Secondary | ICD-10-CM | POA: Diagnosis not present

## 2023-02-08 DIAGNOSIS — S3210XD Unspecified fracture of sacrum, subsequent encounter for fracture with routine healing: Secondary | ICD-10-CM | POA: Diagnosis not present

## 2023-02-08 DIAGNOSIS — R278 Other lack of coordination: Secondary | ICD-10-CM | POA: Diagnosis not present

## 2023-02-09 DIAGNOSIS — R278 Other lack of coordination: Secondary | ICD-10-CM | POA: Diagnosis not present

## 2023-02-09 DIAGNOSIS — M6281 Muscle weakness (generalized): Secondary | ICD-10-CM | POA: Diagnosis not present

## 2023-02-09 DIAGNOSIS — S3210XD Unspecified fracture of sacrum, subsequent encounter for fracture with routine healing: Secondary | ICD-10-CM | POA: Diagnosis not present

## 2023-02-09 DIAGNOSIS — R41841 Cognitive communication deficit: Secondary | ICD-10-CM | POA: Diagnosis not present

## 2023-02-10 DIAGNOSIS — R278 Other lack of coordination: Secondary | ICD-10-CM | POA: Diagnosis not present

## 2023-02-10 DIAGNOSIS — M6281 Muscle weakness (generalized): Secondary | ICD-10-CM | POA: Diagnosis not present

## 2023-02-10 DIAGNOSIS — R41841 Cognitive communication deficit: Secondary | ICD-10-CM | POA: Diagnosis not present

## 2023-02-10 DIAGNOSIS — S3210XD Unspecified fracture of sacrum, subsequent encounter for fracture with routine healing: Secondary | ICD-10-CM | POA: Diagnosis not present

## 2023-02-11 DIAGNOSIS — M6281 Muscle weakness (generalized): Secondary | ICD-10-CM | POA: Diagnosis not present

## 2023-02-11 DIAGNOSIS — R41841 Cognitive communication deficit: Secondary | ICD-10-CM | POA: Diagnosis not present

## 2023-02-11 DIAGNOSIS — S3210XD Unspecified fracture of sacrum, subsequent encounter for fracture with routine healing: Secondary | ICD-10-CM | POA: Diagnosis not present

## 2023-02-11 DIAGNOSIS — R278 Other lack of coordination: Secondary | ICD-10-CM | POA: Diagnosis not present

## 2023-02-14 DIAGNOSIS — R278 Other lack of coordination: Secondary | ICD-10-CM | POA: Diagnosis not present

## 2023-02-14 DIAGNOSIS — M6281 Muscle weakness (generalized): Secondary | ICD-10-CM | POA: Diagnosis not present

## 2023-02-14 DIAGNOSIS — R41841 Cognitive communication deficit: Secondary | ICD-10-CM | POA: Diagnosis not present

## 2023-02-14 DIAGNOSIS — F4321 Adjustment disorder with depressed mood: Secondary | ICD-10-CM | POA: Diagnosis not present

## 2023-02-14 DIAGNOSIS — S3210XD Unspecified fracture of sacrum, subsequent encounter for fracture with routine healing: Secondary | ICD-10-CM | POA: Diagnosis not present

## 2023-02-15 DIAGNOSIS — S3210XD Unspecified fracture of sacrum, subsequent encounter for fracture with routine healing: Secondary | ICD-10-CM | POA: Diagnosis not present

## 2023-02-15 DIAGNOSIS — R278 Other lack of coordination: Secondary | ICD-10-CM | POA: Diagnosis not present

## 2023-02-15 DIAGNOSIS — M6281 Muscle weakness (generalized): Secondary | ICD-10-CM | POA: Diagnosis not present

## 2023-02-15 DIAGNOSIS — R41841 Cognitive communication deficit: Secondary | ICD-10-CM | POA: Diagnosis not present

## 2023-02-16 DIAGNOSIS — M6281 Muscle weakness (generalized): Secondary | ICD-10-CM | POA: Diagnosis not present

## 2023-02-16 DIAGNOSIS — R41841 Cognitive communication deficit: Secondary | ICD-10-CM | POA: Diagnosis not present

## 2023-02-16 DIAGNOSIS — S3210XD Unspecified fracture of sacrum, subsequent encounter for fracture with routine healing: Secondary | ICD-10-CM | POA: Diagnosis not present

## 2023-02-16 DIAGNOSIS — R278 Other lack of coordination: Secondary | ICD-10-CM | POA: Diagnosis not present

## 2023-02-17 DIAGNOSIS — R278 Other lack of coordination: Secondary | ICD-10-CM | POA: Diagnosis not present

## 2023-02-17 DIAGNOSIS — S3210XD Unspecified fracture of sacrum, subsequent encounter for fracture with routine healing: Secondary | ICD-10-CM | POA: Diagnosis not present

## 2023-02-17 DIAGNOSIS — R41841 Cognitive communication deficit: Secondary | ICD-10-CM | POA: Diagnosis not present

## 2023-02-17 DIAGNOSIS — M6281 Muscle weakness (generalized): Secondary | ICD-10-CM | POA: Diagnosis not present

## 2023-02-18 DIAGNOSIS — I10 Essential (primary) hypertension: Secondary | ICD-10-CM | POA: Diagnosis not present

## 2023-02-18 DIAGNOSIS — R278 Other lack of coordination: Secondary | ICD-10-CM | POA: Diagnosis not present

## 2023-02-18 DIAGNOSIS — S3210XD Unspecified fracture of sacrum, subsequent encounter for fracture with routine healing: Secondary | ICD-10-CM | POA: Diagnosis not present

## 2023-02-18 DIAGNOSIS — M6281 Muscle weakness (generalized): Secondary | ICD-10-CM | POA: Diagnosis not present

## 2023-02-18 DIAGNOSIS — I509 Heart failure, unspecified: Secondary | ICD-10-CM | POA: Diagnosis not present

## 2023-02-18 DIAGNOSIS — R41841 Cognitive communication deficit: Secondary | ICD-10-CM | POA: Diagnosis not present

## 2023-02-21 DIAGNOSIS — R41841 Cognitive communication deficit: Secondary | ICD-10-CM | POA: Diagnosis not present

## 2023-02-21 DIAGNOSIS — S3210XD Unspecified fracture of sacrum, subsequent encounter for fracture with routine healing: Secondary | ICD-10-CM | POA: Diagnosis not present

## 2023-02-21 DIAGNOSIS — M6281 Muscle weakness (generalized): Secondary | ICD-10-CM | POA: Diagnosis not present

## 2023-02-21 DIAGNOSIS — R278 Other lack of coordination: Secondary | ICD-10-CM | POA: Diagnosis not present

## 2023-02-22 DIAGNOSIS — R41841 Cognitive communication deficit: Secondary | ICD-10-CM | POA: Diagnosis not present

## 2023-02-22 DIAGNOSIS — R278 Other lack of coordination: Secondary | ICD-10-CM | POA: Diagnosis not present

## 2023-02-22 DIAGNOSIS — S3210XD Unspecified fracture of sacrum, subsequent encounter for fracture with routine healing: Secondary | ICD-10-CM | POA: Diagnosis not present

## 2023-02-22 DIAGNOSIS — M6281 Muscle weakness (generalized): Secondary | ICD-10-CM | POA: Diagnosis not present

## 2023-02-22 DIAGNOSIS — F4321 Adjustment disorder with depressed mood: Secondary | ICD-10-CM | POA: Diagnosis not present

## 2023-02-23 DIAGNOSIS — R41841 Cognitive communication deficit: Secondary | ICD-10-CM | POA: Diagnosis not present

## 2023-02-23 DIAGNOSIS — R278 Other lack of coordination: Secondary | ICD-10-CM | POA: Diagnosis not present

## 2023-02-23 DIAGNOSIS — S3210XD Unspecified fracture of sacrum, subsequent encounter for fracture with routine healing: Secondary | ICD-10-CM | POA: Diagnosis not present

## 2023-02-23 DIAGNOSIS — M6281 Muscle weakness (generalized): Secondary | ICD-10-CM | POA: Diagnosis not present

## 2023-02-24 DIAGNOSIS — R41841 Cognitive communication deficit: Secondary | ICD-10-CM | POA: Diagnosis not present

## 2023-02-24 DIAGNOSIS — R278 Other lack of coordination: Secondary | ICD-10-CM | POA: Diagnosis not present

## 2023-02-24 DIAGNOSIS — S3210XD Unspecified fracture of sacrum, subsequent encounter for fracture with routine healing: Secondary | ICD-10-CM | POA: Diagnosis not present

## 2023-02-24 DIAGNOSIS — M6281 Muscle weakness (generalized): Secondary | ICD-10-CM | POA: Diagnosis not present

## 2023-02-25 ENCOUNTER — Encounter: Payer: Self-pay | Admitting: Family Medicine

## 2023-02-25 ENCOUNTER — Other Ambulatory Visit: Payer: Self-pay | Admitting: Family Medicine

## 2023-02-25 DIAGNOSIS — M6281 Muscle weakness (generalized): Secondary | ICD-10-CM | POA: Diagnosis not present

## 2023-02-25 DIAGNOSIS — R278 Other lack of coordination: Secondary | ICD-10-CM | POA: Diagnosis not present

## 2023-02-25 DIAGNOSIS — F339 Major depressive disorder, recurrent, unspecified: Secondary | ICD-10-CM

## 2023-02-25 DIAGNOSIS — F411 Generalized anxiety disorder: Secondary | ICD-10-CM

## 2023-02-25 DIAGNOSIS — S3210XD Unspecified fracture of sacrum, subsequent encounter for fracture with routine healing: Secondary | ICD-10-CM | POA: Diagnosis not present

## 2023-02-25 DIAGNOSIS — R41841 Cognitive communication deficit: Secondary | ICD-10-CM | POA: Diagnosis not present

## 2023-02-28 DIAGNOSIS — S3210XD Unspecified fracture of sacrum, subsequent encounter for fracture with routine healing: Secondary | ICD-10-CM | POA: Diagnosis not present

## 2023-02-28 DIAGNOSIS — R41841 Cognitive communication deficit: Secondary | ICD-10-CM | POA: Diagnosis not present

## 2023-02-28 DIAGNOSIS — R278 Other lack of coordination: Secondary | ICD-10-CM | POA: Diagnosis not present

## 2023-02-28 DIAGNOSIS — M6281 Muscle weakness (generalized): Secondary | ICD-10-CM | POA: Diagnosis not present

## 2023-03-01 DIAGNOSIS — F4321 Adjustment disorder with depressed mood: Secondary | ICD-10-CM | POA: Diagnosis not present

## 2023-03-01 DIAGNOSIS — M6281 Muscle weakness (generalized): Secondary | ICD-10-CM | POA: Diagnosis not present

## 2023-03-01 DIAGNOSIS — S3210XD Unspecified fracture of sacrum, subsequent encounter for fracture with routine healing: Secondary | ICD-10-CM | POA: Diagnosis not present

## 2023-03-01 DIAGNOSIS — R41841 Cognitive communication deficit: Secondary | ICD-10-CM | POA: Diagnosis not present

## 2023-03-01 DIAGNOSIS — R278 Other lack of coordination: Secondary | ICD-10-CM | POA: Diagnosis not present

## 2023-03-02 DIAGNOSIS — S3210XD Unspecified fracture of sacrum, subsequent encounter for fracture with routine healing: Secondary | ICD-10-CM | POA: Diagnosis not present

## 2023-03-02 DIAGNOSIS — R278 Other lack of coordination: Secondary | ICD-10-CM | POA: Diagnosis not present

## 2023-03-02 DIAGNOSIS — F339 Major depressive disorder, recurrent, unspecified: Secondary | ICD-10-CM | POA: Diagnosis not present

## 2023-03-02 DIAGNOSIS — R41841 Cognitive communication deficit: Secondary | ICD-10-CM | POA: Diagnosis not present

## 2023-03-02 DIAGNOSIS — M6281 Muscle weakness (generalized): Secondary | ICD-10-CM | POA: Diagnosis not present

## 2023-03-02 DIAGNOSIS — F419 Anxiety disorder, unspecified: Secondary | ICD-10-CM | POA: Diagnosis not present

## 2023-03-03 DIAGNOSIS — M6281 Muscle weakness (generalized): Secondary | ICD-10-CM | POA: Diagnosis not present

## 2023-03-03 DIAGNOSIS — S3210XD Unspecified fracture of sacrum, subsequent encounter for fracture with routine healing: Secondary | ICD-10-CM | POA: Diagnosis not present

## 2023-03-03 DIAGNOSIS — R278 Other lack of coordination: Secondary | ICD-10-CM | POA: Diagnosis not present

## 2023-03-03 DIAGNOSIS — R41841 Cognitive communication deficit: Secondary | ICD-10-CM | POA: Diagnosis not present

## 2023-03-04 DIAGNOSIS — M6281 Muscle weakness (generalized): Secondary | ICD-10-CM | POA: Diagnosis not present

## 2023-03-04 DIAGNOSIS — R278 Other lack of coordination: Secondary | ICD-10-CM | POA: Diagnosis not present

## 2023-03-04 DIAGNOSIS — R41841 Cognitive communication deficit: Secondary | ICD-10-CM | POA: Diagnosis not present

## 2023-03-04 DIAGNOSIS — R262 Difficulty in walking, not elsewhere classified: Secondary | ICD-10-CM | POA: Diagnosis not present

## 2023-03-04 DIAGNOSIS — S3210XD Unspecified fracture of sacrum, subsequent encounter for fracture with routine healing: Secondary | ICD-10-CM | POA: Diagnosis not present

## 2023-03-04 DIAGNOSIS — R531 Weakness: Secondary | ICD-10-CM | POA: Diagnosis not present

## 2023-03-05 DIAGNOSIS — S3210XD Unspecified fracture of sacrum, subsequent encounter for fracture with routine healing: Secondary | ICD-10-CM | POA: Diagnosis not present

## 2023-03-05 DIAGNOSIS — R41841 Cognitive communication deficit: Secondary | ICD-10-CM | POA: Diagnosis not present

## 2023-03-05 DIAGNOSIS — M6281 Muscle weakness (generalized): Secondary | ICD-10-CM | POA: Diagnosis not present

## 2023-03-05 DIAGNOSIS — R278 Other lack of coordination: Secondary | ICD-10-CM | POA: Diagnosis not present

## 2023-03-07 DIAGNOSIS — R41841 Cognitive communication deficit: Secondary | ICD-10-CM | POA: Diagnosis not present

## 2023-03-07 DIAGNOSIS — E785 Hyperlipidemia, unspecified: Secondary | ICD-10-CM | POA: Diagnosis not present

## 2023-03-07 DIAGNOSIS — S3210XD Unspecified fracture of sacrum, subsequent encounter for fracture with routine healing: Secondary | ICD-10-CM | POA: Diagnosis not present

## 2023-03-07 DIAGNOSIS — R278 Other lack of coordination: Secondary | ICD-10-CM | POA: Diagnosis not present

## 2023-03-07 DIAGNOSIS — M6281 Muscle weakness (generalized): Secondary | ICD-10-CM | POA: Diagnosis not present

## 2023-03-07 DIAGNOSIS — I4891 Unspecified atrial fibrillation: Secondary | ICD-10-CM | POA: Diagnosis not present

## 2023-03-07 DIAGNOSIS — J449 Chronic obstructive pulmonary disease, unspecified: Secondary | ICD-10-CM | POA: Diagnosis not present

## 2023-03-08 DIAGNOSIS — R41841 Cognitive communication deficit: Secondary | ICD-10-CM | POA: Diagnosis not present

## 2023-03-08 DIAGNOSIS — S3210XD Unspecified fracture of sacrum, subsequent encounter for fracture with routine healing: Secondary | ICD-10-CM | POA: Diagnosis not present

## 2023-03-08 DIAGNOSIS — R278 Other lack of coordination: Secondary | ICD-10-CM | POA: Diagnosis not present

## 2023-03-08 DIAGNOSIS — M6281 Muscle weakness (generalized): Secondary | ICD-10-CM | POA: Diagnosis not present

## 2023-03-09 DIAGNOSIS — S3210XD Unspecified fracture of sacrum, subsequent encounter for fracture with routine healing: Secondary | ICD-10-CM | POA: Diagnosis not present

## 2023-03-09 DIAGNOSIS — R41841 Cognitive communication deficit: Secondary | ICD-10-CM | POA: Diagnosis not present

## 2023-03-09 DIAGNOSIS — M6281 Muscle weakness (generalized): Secondary | ICD-10-CM | POA: Diagnosis not present

## 2023-03-09 DIAGNOSIS — R278 Other lack of coordination: Secondary | ICD-10-CM | POA: Diagnosis not present

## 2023-03-10 DIAGNOSIS — M6281 Muscle weakness (generalized): Secondary | ICD-10-CM | POA: Diagnosis not present

## 2023-03-10 DIAGNOSIS — R41841 Cognitive communication deficit: Secondary | ICD-10-CM | POA: Diagnosis not present

## 2023-03-10 DIAGNOSIS — R278 Other lack of coordination: Secondary | ICD-10-CM | POA: Diagnosis not present

## 2023-03-10 DIAGNOSIS — S3210XD Unspecified fracture of sacrum, subsequent encounter for fracture with routine healing: Secondary | ICD-10-CM | POA: Diagnosis not present

## 2023-03-11 DIAGNOSIS — R41841 Cognitive communication deficit: Secondary | ICD-10-CM | POA: Diagnosis not present

## 2023-03-11 DIAGNOSIS — M6281 Muscle weakness (generalized): Secondary | ICD-10-CM | POA: Diagnosis not present

## 2023-03-11 DIAGNOSIS — S59902A Unspecified injury of left elbow, initial encounter: Secondary | ICD-10-CM | POA: Diagnosis not present

## 2023-03-11 DIAGNOSIS — R278 Other lack of coordination: Secondary | ICD-10-CM | POA: Diagnosis not present

## 2023-03-11 DIAGNOSIS — S3210XD Unspecified fracture of sacrum, subsequent encounter for fracture with routine healing: Secondary | ICD-10-CM | POA: Diagnosis not present

## 2023-03-11 DIAGNOSIS — R5381 Other malaise: Secondary | ICD-10-CM | POA: Diagnosis not present

## 2023-03-14 DIAGNOSIS — M6281 Muscle weakness (generalized): Secondary | ICD-10-CM | POA: Diagnosis not present

## 2023-03-14 DIAGNOSIS — R41841 Cognitive communication deficit: Secondary | ICD-10-CM | POA: Diagnosis not present

## 2023-03-14 DIAGNOSIS — R278 Other lack of coordination: Secondary | ICD-10-CM | POA: Diagnosis not present

## 2023-03-14 DIAGNOSIS — S3210XD Unspecified fracture of sacrum, subsequent encounter for fracture with routine healing: Secondary | ICD-10-CM | POA: Diagnosis not present

## 2023-03-14 DIAGNOSIS — F4321 Adjustment disorder with depressed mood: Secondary | ICD-10-CM | POA: Diagnosis not present

## 2023-03-15 DIAGNOSIS — S3210XD Unspecified fracture of sacrum, subsequent encounter for fracture with routine healing: Secondary | ICD-10-CM | POA: Diagnosis not present

## 2023-03-15 DIAGNOSIS — M6281 Muscle weakness (generalized): Secondary | ICD-10-CM | POA: Diagnosis not present

## 2023-03-15 DIAGNOSIS — R278 Other lack of coordination: Secondary | ICD-10-CM | POA: Diagnosis not present

## 2023-03-15 DIAGNOSIS — R41841 Cognitive communication deficit: Secondary | ICD-10-CM | POA: Diagnosis not present

## 2023-03-16 DIAGNOSIS — S3210XD Unspecified fracture of sacrum, subsequent encounter for fracture with routine healing: Secondary | ICD-10-CM | POA: Diagnosis not present

## 2023-03-16 DIAGNOSIS — F419 Anxiety disorder, unspecified: Secondary | ICD-10-CM | POA: Diagnosis not present

## 2023-03-16 DIAGNOSIS — F5101 Primary insomnia: Secondary | ICD-10-CM | POA: Diagnosis not present

## 2023-03-16 DIAGNOSIS — M6281 Muscle weakness (generalized): Secondary | ICD-10-CM | POA: Diagnosis not present

## 2023-03-16 DIAGNOSIS — F339 Major depressive disorder, recurrent, unspecified: Secondary | ICD-10-CM | POA: Diagnosis not present

## 2023-03-16 DIAGNOSIS — R41841 Cognitive communication deficit: Secondary | ICD-10-CM | POA: Diagnosis not present

## 2023-03-16 DIAGNOSIS — R278 Other lack of coordination: Secondary | ICD-10-CM | POA: Diagnosis not present

## 2023-03-17 DIAGNOSIS — R410 Disorientation, unspecified: Secondary | ICD-10-CM | POA: Diagnosis not present

## 2023-03-17 DIAGNOSIS — F411 Generalized anxiety disorder: Secondary | ICD-10-CM | POA: Diagnosis not present

## 2023-03-17 DIAGNOSIS — S3210XD Unspecified fracture of sacrum, subsequent encounter for fracture with routine healing: Secondary | ICD-10-CM | POA: Diagnosis not present

## 2023-03-17 DIAGNOSIS — M6281 Muscle weakness (generalized): Secondary | ICD-10-CM | POA: Diagnosis not present

## 2023-03-17 DIAGNOSIS — R4182 Altered mental status, unspecified: Secondary | ICD-10-CM | POA: Diagnosis not present

## 2023-03-17 DIAGNOSIS — F339 Major depressive disorder, recurrent, unspecified: Secondary | ICD-10-CM | POA: Diagnosis not present

## 2023-03-17 DIAGNOSIS — R41841 Cognitive communication deficit: Secondary | ICD-10-CM | POA: Diagnosis not present

## 2023-03-17 DIAGNOSIS — R278 Other lack of coordination: Secondary | ICD-10-CM | POA: Diagnosis not present

## 2023-03-18 DIAGNOSIS — M6281 Muscle weakness (generalized): Secondary | ICD-10-CM | POA: Diagnosis not present

## 2023-03-18 DIAGNOSIS — R41841 Cognitive communication deficit: Secondary | ICD-10-CM | POA: Diagnosis not present

## 2023-03-18 DIAGNOSIS — R278 Other lack of coordination: Secondary | ICD-10-CM | POA: Diagnosis not present

## 2023-03-18 DIAGNOSIS — S3210XD Unspecified fracture of sacrum, subsequent encounter for fracture with routine healing: Secondary | ICD-10-CM | POA: Diagnosis not present

## 2023-03-19 DIAGNOSIS — R4182 Altered mental status, unspecified: Secondary | ICD-10-CM | POA: Diagnosis not present

## 2023-03-20 DIAGNOSIS — R278 Other lack of coordination: Secondary | ICD-10-CM | POA: Diagnosis not present

## 2023-03-20 DIAGNOSIS — R41841 Cognitive communication deficit: Secondary | ICD-10-CM | POA: Diagnosis not present

## 2023-03-20 DIAGNOSIS — S3210XD Unspecified fracture of sacrum, subsequent encounter for fracture with routine healing: Secondary | ICD-10-CM | POA: Diagnosis not present

## 2023-03-20 DIAGNOSIS — M6281 Muscle weakness (generalized): Secondary | ICD-10-CM | POA: Diagnosis not present

## 2023-03-21 DIAGNOSIS — S3210XD Unspecified fracture of sacrum, subsequent encounter for fracture with routine healing: Secondary | ICD-10-CM | POA: Diagnosis not present

## 2023-03-21 DIAGNOSIS — R278 Other lack of coordination: Secondary | ICD-10-CM | POA: Diagnosis not present

## 2023-03-21 DIAGNOSIS — R41841 Cognitive communication deficit: Secondary | ICD-10-CM | POA: Diagnosis not present

## 2023-03-21 DIAGNOSIS — M6281 Muscle weakness (generalized): Secondary | ICD-10-CM | POA: Diagnosis not present

## 2023-03-23 DIAGNOSIS — F4321 Adjustment disorder with depressed mood: Secondary | ICD-10-CM | POA: Diagnosis not present

## 2023-03-28 DIAGNOSIS — F4321 Adjustment disorder with depressed mood: Secondary | ICD-10-CM | POA: Diagnosis not present

## 2023-03-30 DIAGNOSIS — F339 Major depressive disorder, recurrent, unspecified: Secondary | ICD-10-CM | POA: Diagnosis not present

## 2023-03-30 DIAGNOSIS — F5101 Primary insomnia: Secondary | ICD-10-CM | POA: Diagnosis not present

## 2023-03-30 DIAGNOSIS — F419 Anxiety disorder, unspecified: Secondary | ICD-10-CM | POA: Diagnosis not present

## 2023-04-03 DIAGNOSIS — R262 Difficulty in walking, not elsewhere classified: Secondary | ICD-10-CM | POA: Diagnosis not present

## 2023-04-03 DIAGNOSIS — R531 Weakness: Secondary | ICD-10-CM | POA: Diagnosis not present

## 2023-04-03 DIAGNOSIS — M6281 Muscle weakness (generalized): Secondary | ICD-10-CM | POA: Diagnosis not present

## 2023-04-05 DIAGNOSIS — H6011 Cellulitis of right external ear: Secondary | ICD-10-CM | POA: Diagnosis not present

## 2023-04-06 DIAGNOSIS — F5101 Primary insomnia: Secondary | ICD-10-CM | POA: Diagnosis not present

## 2023-04-06 DIAGNOSIS — F339 Major depressive disorder, recurrent, unspecified: Secondary | ICD-10-CM | POA: Diagnosis not present

## 2023-04-06 DIAGNOSIS — F419 Anxiety disorder, unspecified: Secondary | ICD-10-CM | POA: Diagnosis not present

## 2023-04-13 DIAGNOSIS — F5101 Primary insomnia: Secondary | ICD-10-CM | POA: Diagnosis not present

## 2023-04-13 DIAGNOSIS — F419 Anxiety disorder, unspecified: Secondary | ICD-10-CM | POA: Diagnosis not present

## 2023-04-13 DIAGNOSIS — F339 Major depressive disorder, recurrent, unspecified: Secondary | ICD-10-CM | POA: Diagnosis not present

## 2023-04-20 DIAGNOSIS — F5101 Primary insomnia: Secondary | ICD-10-CM | POA: Diagnosis not present

## 2023-04-20 DIAGNOSIS — F339 Major depressive disorder, recurrent, unspecified: Secondary | ICD-10-CM | POA: Diagnosis not present

## 2023-04-20 DIAGNOSIS — F419 Anxiety disorder, unspecified: Secondary | ICD-10-CM | POA: Diagnosis not present

## 2023-04-27 DIAGNOSIS — F5101 Primary insomnia: Secondary | ICD-10-CM | POA: Diagnosis not present

## 2023-04-27 DIAGNOSIS — F333 Major depressive disorder, recurrent, severe with psychotic symptoms: Secondary | ICD-10-CM | POA: Diagnosis not present

## 2023-04-27 DIAGNOSIS — F419 Anxiety disorder, unspecified: Secondary | ICD-10-CM | POA: Diagnosis not present

## 2023-05-03 DIAGNOSIS — F339 Major depressive disorder, recurrent, unspecified: Secondary | ICD-10-CM | POA: Diagnosis not present

## 2023-05-03 DIAGNOSIS — R5381 Other malaise: Secondary | ICD-10-CM | POA: Diagnosis not present

## 2023-05-03 DIAGNOSIS — F411 Generalized anxiety disorder: Secondary | ICD-10-CM | POA: Diagnosis not present

## 2023-05-03 DIAGNOSIS — R110 Nausea: Secondary | ICD-10-CM | POA: Diagnosis not present

## 2023-05-04 DIAGNOSIS — R262 Difficulty in walking, not elsewhere classified: Secondary | ICD-10-CM | POA: Diagnosis not present

## 2023-05-04 DIAGNOSIS — M6281 Muscle weakness (generalized): Secondary | ICD-10-CM | POA: Diagnosis not present

## 2023-05-04 DIAGNOSIS — F419 Anxiety disorder, unspecified: Secondary | ICD-10-CM | POA: Diagnosis not present

## 2023-05-04 DIAGNOSIS — R531 Weakness: Secondary | ICD-10-CM | POA: Diagnosis not present

## 2023-05-04 DIAGNOSIS — F5101 Primary insomnia: Secondary | ICD-10-CM | POA: Diagnosis not present

## 2023-05-04 DIAGNOSIS — F333 Major depressive disorder, recurrent, severe with psychotic symptoms: Secondary | ICD-10-CM | POA: Diagnosis not present

## 2023-05-11 DIAGNOSIS — R29898 Other symptoms and signs involving the musculoskeletal system: Secondary | ICD-10-CM | POA: Diagnosis not present

## 2023-05-11 DIAGNOSIS — R5381 Other malaise: Secondary | ICD-10-CM | POA: Diagnosis not present

## 2023-05-11 DIAGNOSIS — Z7409 Other reduced mobility: Secondary | ICD-10-CM | POA: Diagnosis not present

## 2023-05-11 DIAGNOSIS — F419 Anxiety disorder, unspecified: Secondary | ICD-10-CM | POA: Diagnosis not present

## 2023-05-11 DIAGNOSIS — F5101 Primary insomnia: Secondary | ICD-10-CM | POA: Diagnosis not present

## 2023-05-11 DIAGNOSIS — F333 Major depressive disorder, recurrent, severe with psychotic symptoms: Secondary | ICD-10-CM | POA: Diagnosis not present

## 2023-05-18 DIAGNOSIS — F419 Anxiety disorder, unspecified: Secondary | ICD-10-CM | POA: Diagnosis not present

## 2023-05-18 DIAGNOSIS — F5101 Primary insomnia: Secondary | ICD-10-CM | POA: Diagnosis not present

## 2023-05-18 DIAGNOSIS — F333 Major depressive disorder, recurrent, severe with psychotic symptoms: Secondary | ICD-10-CM | POA: Diagnosis not present

## 2023-05-25 DIAGNOSIS — F5101 Primary insomnia: Secondary | ICD-10-CM | POA: Diagnosis not present

## 2023-05-25 DIAGNOSIS — F419 Anxiety disorder, unspecified: Secondary | ICD-10-CM | POA: Diagnosis not present

## 2023-05-25 DIAGNOSIS — F333 Major depressive disorder, recurrent, severe with psychotic symptoms: Secondary | ICD-10-CM | POA: Diagnosis not present

## 2023-06-01 DIAGNOSIS — F419 Anxiety disorder, unspecified: Secondary | ICD-10-CM | POA: Diagnosis not present

## 2023-06-01 DIAGNOSIS — F5101 Primary insomnia: Secondary | ICD-10-CM | POA: Diagnosis not present

## 2023-06-01 DIAGNOSIS — F333 Major depressive disorder, recurrent, severe with psychotic symptoms: Secondary | ICD-10-CM | POA: Diagnosis not present

## 2023-06-03 DIAGNOSIS — R059 Cough, unspecified: Secondary | ICD-10-CM | POA: Diagnosis not present

## 2023-06-03 DIAGNOSIS — R5381 Other malaise: Secondary | ICD-10-CM | POA: Diagnosis not present

## 2023-06-03 DIAGNOSIS — R051 Acute cough: Secondary | ICD-10-CM | POA: Diagnosis not present

## 2023-06-03 DIAGNOSIS — R062 Wheezing: Secondary | ICD-10-CM | POA: Diagnosis not present

## 2023-06-04 DIAGNOSIS — R262 Difficulty in walking, not elsewhere classified: Secondary | ICD-10-CM | POA: Diagnosis not present

## 2023-06-04 DIAGNOSIS — R531 Weakness: Secondary | ICD-10-CM | POA: Diagnosis not present

## 2023-06-04 DIAGNOSIS — M6281 Muscle weakness (generalized): Secondary | ICD-10-CM | POA: Diagnosis not present

## 2023-06-06 DIAGNOSIS — E785 Hyperlipidemia, unspecified: Secondary | ICD-10-CM | POA: Diagnosis not present

## 2023-06-06 DIAGNOSIS — J069 Acute upper respiratory infection, unspecified: Secondary | ICD-10-CM | POA: Diagnosis not present

## 2023-06-06 DIAGNOSIS — J449 Chronic obstructive pulmonary disease, unspecified: Secondary | ICD-10-CM | POA: Diagnosis not present

## 2023-06-06 DIAGNOSIS — R509 Fever, unspecified: Secondary | ICD-10-CM | POA: Diagnosis not present

## 2023-06-08 DIAGNOSIS — F419 Anxiety disorder, unspecified: Secondary | ICD-10-CM | POA: Diagnosis not present

## 2023-06-08 DIAGNOSIS — F333 Major depressive disorder, recurrent, severe with psychotic symptoms: Secondary | ICD-10-CM | POA: Diagnosis not present

## 2023-06-08 DIAGNOSIS — F5101 Primary insomnia: Secondary | ICD-10-CM | POA: Diagnosis not present

## 2023-06-09 DIAGNOSIS — F4321 Adjustment disorder with depressed mood: Secondary | ICD-10-CM | POA: Diagnosis not present

## 2023-06-22 DIAGNOSIS — F419 Anxiety disorder, unspecified: Secondary | ICD-10-CM | POA: Diagnosis not present

## 2023-06-22 DIAGNOSIS — F5101 Primary insomnia: Secondary | ICD-10-CM | POA: Diagnosis not present

## 2023-06-22 DIAGNOSIS — F333 Major depressive disorder, recurrent, severe with psychotic symptoms: Secondary | ICD-10-CM | POA: Diagnosis not present

## 2023-06-27 DIAGNOSIS — M25552 Pain in left hip: Secondary | ICD-10-CM | POA: Diagnosis not present

## 2023-06-27 DIAGNOSIS — S0990XA Unspecified injury of head, initial encounter: Secondary | ICD-10-CM | POA: Diagnosis not present

## 2023-06-27 DIAGNOSIS — N3000 Acute cystitis without hematuria: Secondary | ICD-10-CM | POA: Diagnosis not present

## 2023-06-27 DIAGNOSIS — Z743 Need for continuous supervision: Secondary | ICD-10-CM | POA: Diagnosis not present

## 2023-06-27 DIAGNOSIS — M25511 Pain in right shoulder: Secondary | ICD-10-CM | POA: Diagnosis not present

## 2023-06-27 DIAGNOSIS — M47812 Spondylosis without myelopathy or radiculopathy, cervical region: Secondary | ICD-10-CM | POA: Diagnosis not present

## 2023-06-27 DIAGNOSIS — W19XXXA Unspecified fall, initial encounter: Secondary | ICD-10-CM | POA: Diagnosis not present

## 2023-06-27 DIAGNOSIS — M1612 Unilateral primary osteoarthritis, left hip: Secondary | ICD-10-CM | POA: Diagnosis not present

## 2023-06-27 DIAGNOSIS — I6782 Cerebral ischemia: Secondary | ICD-10-CM | POA: Diagnosis not present

## 2023-06-27 DIAGNOSIS — M25512 Pain in left shoulder: Secondary | ICD-10-CM | POA: Diagnosis not present

## 2023-06-27 DIAGNOSIS — M19012 Primary osteoarthritis, left shoulder: Secondary | ICD-10-CM | POA: Diagnosis not present

## 2023-06-29 DIAGNOSIS — F5101 Primary insomnia: Secondary | ICD-10-CM | POA: Diagnosis not present

## 2023-06-29 DIAGNOSIS — R5381 Other malaise: Secondary | ICD-10-CM | POA: Diagnosis not present

## 2023-06-29 DIAGNOSIS — F419 Anxiety disorder, unspecified: Secondary | ICD-10-CM | POA: Diagnosis not present

## 2023-06-29 DIAGNOSIS — F411 Generalized anxiety disorder: Secondary | ICD-10-CM | POA: Diagnosis not present

## 2023-06-29 DIAGNOSIS — J449 Chronic obstructive pulmonary disease, unspecified: Secondary | ICD-10-CM | POA: Diagnosis not present

## 2023-06-29 DIAGNOSIS — F333 Major depressive disorder, recurrent, severe with psychotic symptoms: Secondary | ICD-10-CM | POA: Diagnosis not present

## 2023-06-30 DIAGNOSIS — F4321 Adjustment disorder with depressed mood: Secondary | ICD-10-CM | POA: Diagnosis not present

## 2023-07-02 DIAGNOSIS — R262 Difficulty in walking, not elsewhere classified: Secondary | ICD-10-CM | POA: Diagnosis not present

## 2023-07-02 DIAGNOSIS — R531 Weakness: Secondary | ICD-10-CM | POA: Diagnosis not present

## 2023-07-02 DIAGNOSIS — M6281 Muscle weakness (generalized): Secondary | ICD-10-CM | POA: Diagnosis not present

## 2023-07-05 DIAGNOSIS — G629 Polyneuropathy, unspecified: Secondary | ICD-10-CM | POA: Diagnosis not present

## 2023-07-05 DIAGNOSIS — R52 Pain, unspecified: Secondary | ICD-10-CM | POA: Diagnosis not present

## 2023-07-06 DIAGNOSIS — F5101 Primary insomnia: Secondary | ICD-10-CM | POA: Diagnosis not present

## 2023-07-06 DIAGNOSIS — F333 Major depressive disorder, recurrent, severe with psychotic symptoms: Secondary | ICD-10-CM | POA: Diagnosis not present

## 2023-07-06 DIAGNOSIS — F419 Anxiety disorder, unspecified: Secondary | ICD-10-CM | POA: Diagnosis not present

## 2023-07-26 DIAGNOSIS — S3210XD Unspecified fracture of sacrum, subsequent encounter for fracture with routine healing: Secondary | ICD-10-CM | POA: Diagnosis not present

## 2023-07-26 DIAGNOSIS — M6281 Muscle weakness (generalized): Secondary | ICD-10-CM | POA: Diagnosis not present

## 2023-07-26 DIAGNOSIS — R278 Other lack of coordination: Secondary | ICD-10-CM | POA: Diagnosis not present

## 2023-07-26 DIAGNOSIS — Z9181 History of falling: Secondary | ICD-10-CM | POA: Diagnosis not present

## 2023-07-26 DIAGNOSIS — M6289 Other specified disorders of muscle: Secondary | ICD-10-CM | POA: Diagnosis not present

## 2023-07-26 DIAGNOSIS — R41841 Cognitive communication deficit: Secondary | ICD-10-CM | POA: Diagnosis not present

## 2023-07-27 DIAGNOSIS — M6281 Muscle weakness (generalized): Secondary | ICD-10-CM | POA: Diagnosis not present

## 2023-07-27 DIAGNOSIS — Z9181 History of falling: Secondary | ICD-10-CM | POA: Diagnosis not present

## 2023-07-27 DIAGNOSIS — S3210XD Unspecified fracture of sacrum, subsequent encounter for fracture with routine healing: Secondary | ICD-10-CM | POA: Diagnosis not present

## 2023-07-27 DIAGNOSIS — R41841 Cognitive communication deficit: Secondary | ICD-10-CM | POA: Diagnosis not present

## 2023-07-27 DIAGNOSIS — R278 Other lack of coordination: Secondary | ICD-10-CM | POA: Diagnosis not present

## 2023-07-27 DIAGNOSIS — M6289 Other specified disorders of muscle: Secondary | ICD-10-CM | POA: Diagnosis not present

## 2023-07-28 DIAGNOSIS — R41841 Cognitive communication deficit: Secondary | ICD-10-CM | POA: Diagnosis not present

## 2023-07-28 DIAGNOSIS — S3210XD Unspecified fracture of sacrum, subsequent encounter for fracture with routine healing: Secondary | ICD-10-CM | POA: Diagnosis not present

## 2023-07-28 DIAGNOSIS — Z9181 History of falling: Secondary | ICD-10-CM | POA: Diagnosis not present

## 2023-07-28 DIAGNOSIS — M6289 Other specified disorders of muscle: Secondary | ICD-10-CM | POA: Diagnosis not present

## 2023-07-28 DIAGNOSIS — M6281 Muscle weakness (generalized): Secondary | ICD-10-CM | POA: Diagnosis not present

## 2023-07-28 DIAGNOSIS — R278 Other lack of coordination: Secondary | ICD-10-CM | POA: Diagnosis not present

## 2023-07-29 DIAGNOSIS — M6281 Muscle weakness (generalized): Secondary | ICD-10-CM | POA: Diagnosis not present

## 2023-07-29 DIAGNOSIS — Z9181 History of falling: Secondary | ICD-10-CM | POA: Diagnosis not present

## 2023-07-29 DIAGNOSIS — M6289 Other specified disorders of muscle: Secondary | ICD-10-CM | POA: Diagnosis not present

## 2023-07-29 DIAGNOSIS — R278 Other lack of coordination: Secondary | ICD-10-CM | POA: Diagnosis not present

## 2023-07-29 DIAGNOSIS — R41841 Cognitive communication deficit: Secondary | ICD-10-CM | POA: Diagnosis not present

## 2023-07-29 DIAGNOSIS — S3210XD Unspecified fracture of sacrum, subsequent encounter for fracture with routine healing: Secondary | ICD-10-CM | POA: Diagnosis not present

## 2023-08-01 DIAGNOSIS — S3210XD Unspecified fracture of sacrum, subsequent encounter for fracture with routine healing: Secondary | ICD-10-CM | POA: Diagnosis not present

## 2023-08-01 DIAGNOSIS — M6281 Muscle weakness (generalized): Secondary | ICD-10-CM | POA: Diagnosis not present

## 2023-08-01 DIAGNOSIS — R41841 Cognitive communication deficit: Secondary | ICD-10-CM | POA: Diagnosis not present

## 2023-08-01 DIAGNOSIS — Z9181 History of falling: Secondary | ICD-10-CM | POA: Diagnosis not present

## 2023-08-01 DIAGNOSIS — M6289 Other specified disorders of muscle: Secondary | ICD-10-CM | POA: Diagnosis not present

## 2023-08-01 DIAGNOSIS — R278 Other lack of coordination: Secondary | ICD-10-CM | POA: Diagnosis not present

## 2023-08-02 DIAGNOSIS — R531 Weakness: Secondary | ICD-10-CM | POA: Diagnosis not present

## 2023-08-02 DIAGNOSIS — M6289 Other specified disorders of muscle: Secondary | ICD-10-CM | POA: Diagnosis not present

## 2023-08-02 DIAGNOSIS — S3210XD Unspecified fracture of sacrum, subsequent encounter for fracture with routine healing: Secondary | ICD-10-CM | POA: Diagnosis not present

## 2023-08-02 DIAGNOSIS — R278 Other lack of coordination: Secondary | ICD-10-CM | POA: Diagnosis not present

## 2023-08-02 DIAGNOSIS — M6281 Muscle weakness (generalized): Secondary | ICD-10-CM | POA: Diagnosis not present

## 2023-08-02 DIAGNOSIS — R262 Difficulty in walking, not elsewhere classified: Secondary | ICD-10-CM | POA: Diagnosis not present

## 2023-08-02 DIAGNOSIS — R41841 Cognitive communication deficit: Secondary | ICD-10-CM | POA: Diagnosis not present

## 2023-08-02 DIAGNOSIS — Z9181 History of falling: Secondary | ICD-10-CM | POA: Diagnosis not present

## 2023-08-03 DIAGNOSIS — S3210XD Unspecified fracture of sacrum, subsequent encounter for fracture with routine healing: Secondary | ICD-10-CM | POA: Diagnosis not present

## 2023-08-03 DIAGNOSIS — F419 Anxiety disorder, unspecified: Secondary | ICD-10-CM | POA: Diagnosis not present

## 2023-08-03 DIAGNOSIS — F333 Major depressive disorder, recurrent, severe with psychotic symptoms: Secondary | ICD-10-CM | POA: Diagnosis not present

## 2023-08-03 DIAGNOSIS — M6289 Other specified disorders of muscle: Secondary | ICD-10-CM | POA: Diagnosis not present

## 2023-08-03 DIAGNOSIS — R41841 Cognitive communication deficit: Secondary | ICD-10-CM | POA: Diagnosis not present

## 2023-08-03 DIAGNOSIS — Z9181 History of falling: Secondary | ICD-10-CM | POA: Diagnosis not present

## 2023-08-03 DIAGNOSIS — M6281 Muscle weakness (generalized): Secondary | ICD-10-CM | POA: Diagnosis not present

## 2023-08-03 DIAGNOSIS — F5101 Primary insomnia: Secondary | ICD-10-CM | POA: Diagnosis not present

## 2023-08-03 DIAGNOSIS — R278 Other lack of coordination: Secondary | ICD-10-CM | POA: Diagnosis not present

## 2023-08-04 DIAGNOSIS — M6289 Other specified disorders of muscle: Secondary | ICD-10-CM | POA: Diagnosis not present

## 2023-08-04 DIAGNOSIS — R41841 Cognitive communication deficit: Secondary | ICD-10-CM | POA: Diagnosis not present

## 2023-08-04 DIAGNOSIS — Z9181 History of falling: Secondary | ICD-10-CM | POA: Diagnosis not present

## 2023-08-04 DIAGNOSIS — S3210XD Unspecified fracture of sacrum, subsequent encounter for fracture with routine healing: Secondary | ICD-10-CM | POA: Diagnosis not present

## 2023-08-04 DIAGNOSIS — R278 Other lack of coordination: Secondary | ICD-10-CM | POA: Diagnosis not present

## 2023-08-04 DIAGNOSIS — M6281 Muscle weakness (generalized): Secondary | ICD-10-CM | POA: Diagnosis not present

## 2023-08-05 DIAGNOSIS — M6289 Other specified disorders of muscle: Secondary | ICD-10-CM | POA: Diagnosis not present

## 2023-08-05 DIAGNOSIS — S3210XD Unspecified fracture of sacrum, subsequent encounter for fracture with routine healing: Secondary | ICD-10-CM | POA: Diagnosis not present

## 2023-08-05 DIAGNOSIS — Z9181 History of falling: Secondary | ICD-10-CM | POA: Diagnosis not present

## 2023-08-05 DIAGNOSIS — R41841 Cognitive communication deficit: Secondary | ICD-10-CM | POA: Diagnosis not present

## 2023-08-05 DIAGNOSIS — R278 Other lack of coordination: Secondary | ICD-10-CM | POA: Diagnosis not present

## 2023-08-05 DIAGNOSIS — M6281 Muscle weakness (generalized): Secondary | ICD-10-CM | POA: Diagnosis not present

## 2023-08-08 DIAGNOSIS — Z9181 History of falling: Secondary | ICD-10-CM | POA: Diagnosis not present

## 2023-08-08 DIAGNOSIS — R41841 Cognitive communication deficit: Secondary | ICD-10-CM | POA: Diagnosis not present

## 2023-08-08 DIAGNOSIS — J449 Chronic obstructive pulmonary disease, unspecified: Secondary | ICD-10-CM | POA: Diagnosis not present

## 2023-08-08 DIAGNOSIS — R5381 Other malaise: Secondary | ICD-10-CM | POA: Diagnosis not present

## 2023-08-08 DIAGNOSIS — R059 Cough, unspecified: Secondary | ICD-10-CM | POA: Diagnosis not present

## 2023-08-08 DIAGNOSIS — S3210XD Unspecified fracture of sacrum, subsequent encounter for fracture with routine healing: Secondary | ICD-10-CM | POA: Diagnosis not present

## 2023-08-08 DIAGNOSIS — M6281 Muscle weakness (generalized): Secondary | ICD-10-CM | POA: Diagnosis not present

## 2023-08-08 DIAGNOSIS — R278 Other lack of coordination: Secondary | ICD-10-CM | POA: Diagnosis not present

## 2023-08-08 DIAGNOSIS — M6289 Other specified disorders of muscle: Secondary | ICD-10-CM | POA: Diagnosis not present

## 2023-08-09 DIAGNOSIS — M6289 Other specified disorders of muscle: Secondary | ICD-10-CM | POA: Diagnosis not present

## 2023-08-09 DIAGNOSIS — Z9181 History of falling: Secondary | ICD-10-CM | POA: Diagnosis not present

## 2023-08-09 DIAGNOSIS — R278 Other lack of coordination: Secondary | ICD-10-CM | POA: Diagnosis not present

## 2023-08-09 DIAGNOSIS — S3210XD Unspecified fracture of sacrum, subsequent encounter for fracture with routine healing: Secondary | ICD-10-CM | POA: Diagnosis not present

## 2023-08-09 DIAGNOSIS — M6281 Muscle weakness (generalized): Secondary | ICD-10-CM | POA: Diagnosis not present

## 2023-08-09 DIAGNOSIS — R41841 Cognitive communication deficit: Secondary | ICD-10-CM | POA: Diagnosis not present

## 2023-08-10 DIAGNOSIS — R278 Other lack of coordination: Secondary | ICD-10-CM | POA: Diagnosis not present

## 2023-08-10 DIAGNOSIS — Z9181 History of falling: Secondary | ICD-10-CM | POA: Diagnosis not present

## 2023-08-10 DIAGNOSIS — M6281 Muscle weakness (generalized): Secondary | ICD-10-CM | POA: Diagnosis not present

## 2023-08-10 DIAGNOSIS — M6289 Other specified disorders of muscle: Secondary | ICD-10-CM | POA: Diagnosis not present

## 2023-08-10 DIAGNOSIS — F5101 Primary insomnia: Secondary | ICD-10-CM | POA: Diagnosis not present

## 2023-08-10 DIAGNOSIS — F419 Anxiety disorder, unspecified: Secondary | ICD-10-CM | POA: Diagnosis not present

## 2023-08-10 DIAGNOSIS — F333 Major depressive disorder, recurrent, severe with psychotic symptoms: Secondary | ICD-10-CM | POA: Diagnosis not present

## 2023-08-10 DIAGNOSIS — R41841 Cognitive communication deficit: Secondary | ICD-10-CM | POA: Diagnosis not present

## 2023-08-10 DIAGNOSIS — S3210XD Unspecified fracture of sacrum, subsequent encounter for fracture with routine healing: Secondary | ICD-10-CM | POA: Diagnosis not present

## 2023-08-11 DIAGNOSIS — R41841 Cognitive communication deficit: Secondary | ICD-10-CM | POA: Diagnosis not present

## 2023-08-11 DIAGNOSIS — S3210XD Unspecified fracture of sacrum, subsequent encounter for fracture with routine healing: Secondary | ICD-10-CM | POA: Diagnosis not present

## 2023-08-11 DIAGNOSIS — R278 Other lack of coordination: Secondary | ICD-10-CM | POA: Diagnosis not present

## 2023-08-11 DIAGNOSIS — Z9181 History of falling: Secondary | ICD-10-CM | POA: Diagnosis not present

## 2023-08-11 DIAGNOSIS — M6281 Muscle weakness (generalized): Secondary | ICD-10-CM | POA: Diagnosis not present

## 2023-08-11 DIAGNOSIS — R059 Cough, unspecified: Secondary | ICD-10-CM | POA: Diagnosis not present

## 2023-08-11 DIAGNOSIS — M6289 Other specified disorders of muscle: Secondary | ICD-10-CM | POA: Diagnosis not present

## 2023-08-11 DIAGNOSIS — I517 Cardiomegaly: Secondary | ICD-10-CM | POA: Diagnosis not present

## 2023-08-12 DIAGNOSIS — R41841 Cognitive communication deficit: Secondary | ICD-10-CM | POA: Diagnosis not present

## 2023-08-12 DIAGNOSIS — S3210XD Unspecified fracture of sacrum, subsequent encounter for fracture with routine healing: Secondary | ICD-10-CM | POA: Diagnosis not present

## 2023-08-12 DIAGNOSIS — M6281 Muscle weakness (generalized): Secondary | ICD-10-CM | POA: Diagnosis not present

## 2023-08-12 DIAGNOSIS — R278 Other lack of coordination: Secondary | ICD-10-CM | POA: Diagnosis not present

## 2023-08-12 DIAGNOSIS — M6289 Other specified disorders of muscle: Secondary | ICD-10-CM | POA: Diagnosis not present

## 2023-08-12 DIAGNOSIS — Z9181 History of falling: Secondary | ICD-10-CM | POA: Diagnosis not present

## 2023-08-13 DIAGNOSIS — J019 Acute sinusitis, unspecified: Secondary | ICD-10-CM | POA: Diagnosis not present

## 2023-08-15 DIAGNOSIS — F4321 Adjustment disorder with depressed mood: Secondary | ICD-10-CM | POA: Diagnosis not present

## 2023-08-15 DIAGNOSIS — R41841 Cognitive communication deficit: Secondary | ICD-10-CM | POA: Diagnosis not present

## 2023-08-15 DIAGNOSIS — Z9181 History of falling: Secondary | ICD-10-CM | POA: Diagnosis not present

## 2023-08-15 DIAGNOSIS — M6289 Other specified disorders of muscle: Secondary | ICD-10-CM | POA: Diagnosis not present

## 2023-08-15 DIAGNOSIS — M6281 Muscle weakness (generalized): Secondary | ICD-10-CM | POA: Diagnosis not present

## 2023-08-15 DIAGNOSIS — S3210XD Unspecified fracture of sacrum, subsequent encounter for fracture with routine healing: Secondary | ICD-10-CM | POA: Diagnosis not present

## 2023-08-15 DIAGNOSIS — R278 Other lack of coordination: Secondary | ICD-10-CM | POA: Diagnosis not present

## 2023-08-16 DIAGNOSIS — R278 Other lack of coordination: Secondary | ICD-10-CM | POA: Diagnosis not present

## 2023-08-16 DIAGNOSIS — M6289 Other specified disorders of muscle: Secondary | ICD-10-CM | POA: Diagnosis not present

## 2023-08-16 DIAGNOSIS — M6281 Muscle weakness (generalized): Secondary | ICD-10-CM | POA: Diagnosis not present

## 2023-08-16 DIAGNOSIS — S3210XD Unspecified fracture of sacrum, subsequent encounter for fracture with routine healing: Secondary | ICD-10-CM | POA: Diagnosis not present

## 2023-08-16 DIAGNOSIS — Z9181 History of falling: Secondary | ICD-10-CM | POA: Diagnosis not present

## 2023-08-16 DIAGNOSIS — R41841 Cognitive communication deficit: Secondary | ICD-10-CM | POA: Diagnosis not present

## 2023-08-17 DIAGNOSIS — Z9181 History of falling: Secondary | ICD-10-CM | POA: Diagnosis not present

## 2023-08-17 DIAGNOSIS — F333 Major depressive disorder, recurrent, severe with psychotic symptoms: Secondary | ICD-10-CM | POA: Diagnosis not present

## 2023-08-17 DIAGNOSIS — S3210XD Unspecified fracture of sacrum, subsequent encounter for fracture with routine healing: Secondary | ICD-10-CM | POA: Diagnosis not present

## 2023-08-17 DIAGNOSIS — R278 Other lack of coordination: Secondary | ICD-10-CM | POA: Diagnosis not present

## 2023-08-17 DIAGNOSIS — F419 Anxiety disorder, unspecified: Secondary | ICD-10-CM | POA: Diagnosis not present

## 2023-08-17 DIAGNOSIS — F5101 Primary insomnia: Secondary | ICD-10-CM | POA: Diagnosis not present

## 2023-08-17 DIAGNOSIS — R41841 Cognitive communication deficit: Secondary | ICD-10-CM | POA: Diagnosis not present

## 2023-08-17 DIAGNOSIS — M6281 Muscle weakness (generalized): Secondary | ICD-10-CM | POA: Diagnosis not present

## 2023-08-17 DIAGNOSIS — M6289 Other specified disorders of muscle: Secondary | ICD-10-CM | POA: Diagnosis not present

## 2023-08-18 DIAGNOSIS — R41841 Cognitive communication deficit: Secondary | ICD-10-CM | POA: Diagnosis not present

## 2023-08-18 DIAGNOSIS — M6289 Other specified disorders of muscle: Secondary | ICD-10-CM | POA: Diagnosis not present

## 2023-08-18 DIAGNOSIS — R278 Other lack of coordination: Secondary | ICD-10-CM | POA: Diagnosis not present

## 2023-08-18 DIAGNOSIS — Z9181 History of falling: Secondary | ICD-10-CM | POA: Diagnosis not present

## 2023-08-18 DIAGNOSIS — M6281 Muscle weakness (generalized): Secondary | ICD-10-CM | POA: Diagnosis not present

## 2023-08-18 DIAGNOSIS — S3210XD Unspecified fracture of sacrum, subsequent encounter for fracture with routine healing: Secondary | ICD-10-CM | POA: Diagnosis not present

## 2023-08-19 DIAGNOSIS — Z9181 History of falling: Secondary | ICD-10-CM | POA: Diagnosis not present

## 2023-08-19 DIAGNOSIS — S3210XD Unspecified fracture of sacrum, subsequent encounter for fracture with routine healing: Secondary | ICD-10-CM | POA: Diagnosis not present

## 2023-08-19 DIAGNOSIS — M6281 Muscle weakness (generalized): Secondary | ICD-10-CM | POA: Diagnosis not present

## 2023-08-19 DIAGNOSIS — R41841 Cognitive communication deficit: Secondary | ICD-10-CM | POA: Diagnosis not present

## 2023-08-19 DIAGNOSIS — R278 Other lack of coordination: Secondary | ICD-10-CM | POA: Diagnosis not present

## 2023-08-19 DIAGNOSIS — M6289 Other specified disorders of muscle: Secondary | ICD-10-CM | POA: Diagnosis not present

## 2023-08-21 DIAGNOSIS — M6289 Other specified disorders of muscle: Secondary | ICD-10-CM | POA: Diagnosis not present

## 2023-08-21 DIAGNOSIS — R41841 Cognitive communication deficit: Secondary | ICD-10-CM | POA: Diagnosis not present

## 2023-08-21 DIAGNOSIS — R278 Other lack of coordination: Secondary | ICD-10-CM | POA: Diagnosis not present

## 2023-08-21 DIAGNOSIS — Z9181 History of falling: Secondary | ICD-10-CM | POA: Diagnosis not present

## 2023-08-21 DIAGNOSIS — S3210XD Unspecified fracture of sacrum, subsequent encounter for fracture with routine healing: Secondary | ICD-10-CM | POA: Diagnosis not present

## 2023-08-21 DIAGNOSIS — M6281 Muscle weakness (generalized): Secondary | ICD-10-CM | POA: Diagnosis not present

## 2023-08-22 DIAGNOSIS — M6281 Muscle weakness (generalized): Secondary | ICD-10-CM | POA: Diagnosis not present

## 2023-08-22 DIAGNOSIS — Z9181 History of falling: Secondary | ICD-10-CM | POA: Diagnosis not present

## 2023-08-22 DIAGNOSIS — R278 Other lack of coordination: Secondary | ICD-10-CM | POA: Diagnosis not present

## 2023-08-22 DIAGNOSIS — R41841 Cognitive communication deficit: Secondary | ICD-10-CM | POA: Diagnosis not present

## 2023-08-22 DIAGNOSIS — S3210XD Unspecified fracture of sacrum, subsequent encounter for fracture with routine healing: Secondary | ICD-10-CM | POA: Diagnosis not present

## 2023-08-22 DIAGNOSIS — M6289 Other specified disorders of muscle: Secondary | ICD-10-CM | POA: Diagnosis not present

## 2023-08-23 DIAGNOSIS — M6289 Other specified disorders of muscle: Secondary | ICD-10-CM | POA: Diagnosis not present

## 2023-08-23 DIAGNOSIS — S3210XD Unspecified fracture of sacrum, subsequent encounter for fracture with routine healing: Secondary | ICD-10-CM | POA: Diagnosis not present

## 2023-08-23 DIAGNOSIS — M6281 Muscle weakness (generalized): Secondary | ICD-10-CM | POA: Diagnosis not present

## 2023-08-23 DIAGNOSIS — R278 Other lack of coordination: Secondary | ICD-10-CM | POA: Diagnosis not present

## 2023-08-23 DIAGNOSIS — R41841 Cognitive communication deficit: Secondary | ICD-10-CM | POA: Diagnosis not present

## 2023-08-23 DIAGNOSIS — Z9181 History of falling: Secondary | ICD-10-CM | POA: Diagnosis not present

## 2023-08-24 DIAGNOSIS — L89623 Pressure ulcer of left heel, stage 3: Secondary | ICD-10-CM | POA: Diagnosis not present

## 2023-08-24 DIAGNOSIS — R278 Other lack of coordination: Secondary | ICD-10-CM | POA: Diagnosis not present

## 2023-08-24 DIAGNOSIS — F4321 Adjustment disorder with depressed mood: Secondary | ICD-10-CM | POA: Diagnosis not present

## 2023-08-24 DIAGNOSIS — M6289 Other specified disorders of muscle: Secondary | ICD-10-CM | POA: Diagnosis not present

## 2023-08-24 DIAGNOSIS — R41841 Cognitive communication deficit: Secondary | ICD-10-CM | POA: Diagnosis not present

## 2023-08-24 DIAGNOSIS — M6281 Muscle weakness (generalized): Secondary | ICD-10-CM | POA: Diagnosis not present

## 2023-08-24 DIAGNOSIS — S3210XD Unspecified fracture of sacrum, subsequent encounter for fracture with routine healing: Secondary | ICD-10-CM | POA: Diagnosis not present

## 2023-08-24 DIAGNOSIS — I5022 Chronic systolic (congestive) heart failure: Secondary | ICD-10-CM | POA: Diagnosis not present

## 2023-08-24 DIAGNOSIS — Z9181 History of falling: Secondary | ICD-10-CM | POA: Diagnosis not present

## 2023-08-24 DIAGNOSIS — G629 Polyneuropathy, unspecified: Secondary | ICD-10-CM | POA: Diagnosis not present

## 2023-08-25 DIAGNOSIS — S3210XD Unspecified fracture of sacrum, subsequent encounter for fracture with routine healing: Secondary | ICD-10-CM | POA: Diagnosis not present

## 2023-08-25 DIAGNOSIS — M6281 Muscle weakness (generalized): Secondary | ICD-10-CM | POA: Diagnosis not present

## 2023-08-25 DIAGNOSIS — M6289 Other specified disorders of muscle: Secondary | ICD-10-CM | POA: Diagnosis not present

## 2023-08-25 DIAGNOSIS — R278 Other lack of coordination: Secondary | ICD-10-CM | POA: Diagnosis not present

## 2023-08-25 DIAGNOSIS — Z9181 History of falling: Secondary | ICD-10-CM | POA: Diagnosis not present

## 2023-08-29 DIAGNOSIS — F4321 Adjustment disorder with depressed mood: Secondary | ICD-10-CM | POA: Diagnosis not present

## 2023-08-31 DIAGNOSIS — F333 Major depressive disorder, recurrent, severe with psychotic symptoms: Secondary | ICD-10-CM | POA: Diagnosis not present

## 2023-08-31 DIAGNOSIS — M6281 Muscle weakness (generalized): Secondary | ICD-10-CM | POA: Diagnosis not present

## 2023-08-31 DIAGNOSIS — F5101 Primary insomnia: Secondary | ICD-10-CM | POA: Diagnosis not present

## 2023-08-31 DIAGNOSIS — L89623 Pressure ulcer of left heel, stage 3: Secondary | ICD-10-CM | POA: Diagnosis not present

## 2023-08-31 DIAGNOSIS — I5022 Chronic systolic (congestive) heart failure: Secondary | ICD-10-CM | POA: Diagnosis not present

## 2023-08-31 DIAGNOSIS — G629 Polyneuropathy, unspecified: Secondary | ICD-10-CM | POA: Diagnosis not present

## 2023-08-31 DIAGNOSIS — F419 Anxiety disorder, unspecified: Secondary | ICD-10-CM | POA: Diagnosis not present

## 2023-09-01 DIAGNOSIS — M6281 Muscle weakness (generalized): Secondary | ICD-10-CM | POA: Diagnosis not present

## 2023-09-01 DIAGNOSIS — R262 Difficulty in walking, not elsewhere classified: Secondary | ICD-10-CM | POA: Diagnosis not present

## 2023-09-01 DIAGNOSIS — R531 Weakness: Secondary | ICD-10-CM | POA: Diagnosis not present

## 2023-09-07 DIAGNOSIS — F4321 Adjustment disorder with depressed mood: Secondary | ICD-10-CM | POA: Diagnosis not present

## 2023-09-07 DIAGNOSIS — F5101 Primary insomnia: Secondary | ICD-10-CM | POA: Diagnosis not present

## 2023-09-07 DIAGNOSIS — F333 Major depressive disorder, recurrent, severe with psychotic symptoms: Secondary | ICD-10-CM | POA: Diagnosis not present

## 2023-09-07 DIAGNOSIS — F419 Anxiety disorder, unspecified: Secondary | ICD-10-CM | POA: Diagnosis not present

## 2023-09-14 DIAGNOSIS — F331 Major depressive disorder, recurrent, moderate: Secondary | ICD-10-CM | POA: Diagnosis not present

## 2023-09-14 DIAGNOSIS — F419 Anxiety disorder, unspecified: Secondary | ICD-10-CM | POA: Diagnosis not present

## 2023-09-14 DIAGNOSIS — F333 Major depressive disorder, recurrent, severe with psychotic symptoms: Secondary | ICD-10-CM | POA: Diagnosis not present

## 2023-09-14 DIAGNOSIS — F411 Generalized anxiety disorder: Secondary | ICD-10-CM | POA: Diagnosis not present

## 2023-09-14 DIAGNOSIS — F5101 Primary insomnia: Secondary | ICD-10-CM | POA: Diagnosis not present

## 2023-09-21 DIAGNOSIS — I10 Essential (primary) hypertension: Secondary | ICD-10-CM | POA: Diagnosis not present

## 2023-09-21 DIAGNOSIS — F419 Anxiety disorder, unspecified: Secondary | ICD-10-CM | POA: Diagnosis not present

## 2023-09-21 DIAGNOSIS — F5101 Primary insomnia: Secondary | ICD-10-CM | POA: Diagnosis not present

## 2023-09-21 DIAGNOSIS — F333 Major depressive disorder, recurrent, severe with psychotic symptoms: Secondary | ICD-10-CM | POA: Diagnosis not present

## 2023-09-21 DIAGNOSIS — E559 Vitamin D deficiency, unspecified: Secondary | ICD-10-CM | POA: Diagnosis not present

## 2023-11-14 ENCOUNTER — Encounter: Payer: Self-pay | Admitting: Family Medicine

## 2023-11-14 ENCOUNTER — Other Ambulatory Visit: Payer: Self-pay | Admitting: Family Medicine

## 2023-11-14 DIAGNOSIS — F411 Generalized anxiety disorder: Secondary | ICD-10-CM

## 2023-11-14 DIAGNOSIS — E782 Mixed hyperlipidemia: Secondary | ICD-10-CM

## 2023-11-14 DIAGNOSIS — G2581 Restless legs syndrome: Secondary | ICD-10-CM

## 2023-11-14 DIAGNOSIS — F339 Major depressive disorder, recurrent, unspecified: Secondary | ICD-10-CM

## 2023-11-14 NOTE — Telephone Encounter (Signed)
 Tiffany NTBS last OV 05/28/22 NO RF sent to pharmacy last OV greater than a year

## 2023-11-14 NOTE — Telephone Encounter (Signed)
 Number on file not in service. Letter mailed
